# Patient Record
Sex: Female | Born: 1985 | Race: Black or African American | Hispanic: No | Smoking: Current some day smoker
Health system: Southern US, Community
[De-identification: ages and names within clinical notes are randomized; demographics above are authoritative.]

## PROBLEM LIST (undated history)

## (undated) DIAGNOSIS — D582 Other hemoglobinopathies: Secondary | ICD-10-CM

## (undated) DIAGNOSIS — G8929 Other chronic pain: Secondary | ICD-10-CM

## (undated) DIAGNOSIS — N739 Female pelvic inflammatory disease, unspecified: Secondary | ICD-10-CM

## (undated) DIAGNOSIS — I10 Essential (primary) hypertension: Secondary | ICD-10-CM

## (undated) DIAGNOSIS — R109 Unspecified abdominal pain: Secondary | ICD-10-CM

## (undated) DIAGNOSIS — G43709 Chronic migraine without aura, not intractable, without status migrainosus: Secondary | ICD-10-CM

## (undated) DIAGNOSIS — M797 Fibromyalgia: Secondary | ICD-10-CM

## (undated) DIAGNOSIS — IMO0002 Reserved for concepts with insufficient information to code with codable children: Secondary | ICD-10-CM

## (undated) DIAGNOSIS — G894 Chronic pain syndrome: Secondary | ICD-10-CM

## (undated) HISTORY — PX: TUBAL LIGATION: SHX77

## (undated) HISTORY — PX: OTHER SURGICAL HISTORY: SHX169

## (undated) HISTORY — PX: WISDOM TOOTH EXTRACTION: SHX21

---
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
## 2004-10-06 ENCOUNTER — Observation Stay: Payer: Self-pay | Admitting: Obstetrics and Gynecology

## 2004-10-07 ENCOUNTER — Observation Stay: Payer: Self-pay

## 2004-10-09 ENCOUNTER — Ambulatory Visit (HOSPITAL_COMMUNITY): Admission: AD | Admit: 2004-10-09 | Discharge: 2004-10-09 | Payer: Self-pay | Admitting: Obstetrics & Gynecology

## 2004-10-12 ENCOUNTER — Inpatient Hospital Stay: Payer: Self-pay | Admitting: Obstetrics and Gynecology

## 2004-10-22 ENCOUNTER — Observation Stay: Payer: Self-pay | Admitting: Obstetrics and Gynecology

## 2004-11-03 ENCOUNTER — Ambulatory Visit (HOSPITAL_COMMUNITY): Admission: AD | Admit: 2004-11-03 | Discharge: 2004-11-03 | Payer: Self-pay | Admitting: Obstetrics & Gynecology

## 2004-11-04 ENCOUNTER — Observation Stay (HOSPITAL_COMMUNITY): Admission: AD | Admit: 2004-11-04 | Discharge: 2004-11-04 | Payer: Self-pay | Admitting: Obstetrics & Gynecology

## 2004-11-04 ENCOUNTER — Observation Stay: Payer: Self-pay

## 2006-03-28 ENCOUNTER — Emergency Department (HOSPITAL_COMMUNITY): Admission: EM | Admit: 2006-03-28 | Discharge: 2006-03-29 | Payer: Self-pay | Admitting: Emergency Medicine

## 2006-08-19 ENCOUNTER — Emergency Department (HOSPITAL_COMMUNITY): Admission: EM | Admit: 2006-08-19 | Discharge: 2006-08-19 | Payer: Self-pay | Admitting: Emergency Medicine

## 2006-11-10 ENCOUNTER — Emergency Department: Payer: Self-pay | Admitting: Emergency Medicine

## 2006-11-28 ENCOUNTER — Emergency Department (HOSPITAL_COMMUNITY): Admission: EM | Admit: 2006-11-28 | Discharge: 2006-11-28 | Payer: Self-pay | Admitting: Emergency Medicine

## 2006-12-20 ENCOUNTER — Emergency Department: Payer: Self-pay | Admitting: Emergency Medicine

## 2007-01-23 ENCOUNTER — Emergency Department: Payer: Self-pay | Admitting: Emergency Medicine

## 2007-04-07 ENCOUNTER — Emergency Department: Payer: Self-pay | Admitting: Emergency Medicine

## 2007-05-30 ENCOUNTER — Emergency Department: Payer: Self-pay | Admitting: Emergency Medicine

## 2007-06-14 ENCOUNTER — Emergency Department (HOSPITAL_COMMUNITY): Admission: EM | Admit: 2007-06-14 | Discharge: 2007-06-15 | Payer: Self-pay | Admitting: Emergency Medicine

## 2007-07-22 ENCOUNTER — Emergency Department (HOSPITAL_COMMUNITY): Admission: EM | Admit: 2007-07-22 | Discharge: 2007-07-22 | Payer: Self-pay | Admitting: Emergency Medicine

## 2007-08-28 ENCOUNTER — Emergency Department: Payer: Self-pay | Admitting: Emergency Medicine

## 2007-10-24 ENCOUNTER — Emergency Department (HOSPITAL_COMMUNITY): Admission: EM | Admit: 2007-10-24 | Discharge: 2007-10-24 | Payer: Self-pay | Admitting: Emergency Medicine

## 2008-02-25 ENCOUNTER — Emergency Department (HOSPITAL_COMMUNITY): Admission: EM | Admit: 2008-02-25 | Discharge: 2008-02-25 | Payer: Self-pay | Admitting: Emergency Medicine

## 2008-03-10 ENCOUNTER — Emergency Department: Payer: Self-pay | Admitting: Emergency Medicine

## 2008-03-28 ENCOUNTER — Emergency Department (HOSPITAL_COMMUNITY): Admission: EM | Admit: 2008-03-28 | Discharge: 2008-03-28 | Payer: Self-pay | Admitting: Emergency Medicine

## 2008-05-13 ENCOUNTER — Emergency Department (HOSPITAL_COMMUNITY): Admission: EM | Admit: 2008-05-13 | Discharge: 2008-05-13 | Payer: Self-pay | Admitting: Emergency Medicine

## 2008-06-10 ENCOUNTER — Emergency Department: Payer: Self-pay | Admitting: Emergency Medicine

## 2008-06-24 ENCOUNTER — Emergency Department (HOSPITAL_COMMUNITY): Admission: EM | Admit: 2008-06-24 | Discharge: 2008-06-24 | Payer: Self-pay | Admitting: Emergency Medicine

## 2008-06-27 ENCOUNTER — Emergency Department (HOSPITAL_COMMUNITY): Admission: EM | Admit: 2008-06-27 | Discharge: 2008-06-27 | Payer: Self-pay | Admitting: Emergency Medicine

## 2008-07-03 ENCOUNTER — Emergency Department (HOSPITAL_COMMUNITY): Admission: EM | Admit: 2008-07-03 | Discharge: 2008-07-03 | Payer: Self-pay | Admitting: Emergency Medicine

## 2008-07-04 ENCOUNTER — Emergency Department (HOSPITAL_COMMUNITY): Admission: EM | Admit: 2008-07-04 | Discharge: 2008-07-04 | Payer: Self-pay | Admitting: Emergency Medicine

## 2008-08-11 ENCOUNTER — Emergency Department (HOSPITAL_COMMUNITY): Admission: EM | Admit: 2008-08-11 | Discharge: 2008-08-11 | Payer: Self-pay | Admitting: Emergency Medicine

## 2008-08-21 ENCOUNTER — Emergency Department (HOSPITAL_COMMUNITY): Admission: EM | Admit: 2008-08-21 | Discharge: 2008-08-21 | Payer: Self-pay | Admitting: Emergency Medicine

## 2008-09-28 ENCOUNTER — Emergency Department (HOSPITAL_COMMUNITY): Admission: EM | Admit: 2008-09-28 | Discharge: 2008-09-28 | Payer: Self-pay | Admitting: Emergency Medicine

## 2008-10-05 ENCOUNTER — Emergency Department (HOSPITAL_COMMUNITY): Admission: EM | Admit: 2008-10-05 | Discharge: 2008-10-05 | Payer: Self-pay | Admitting: Emergency Medicine

## 2008-10-27 ENCOUNTER — Emergency Department (HOSPITAL_COMMUNITY): Admission: EM | Admit: 2008-10-27 | Discharge: 2008-10-27 | Payer: Self-pay | Admitting: Emergency Medicine

## 2008-11-20 ENCOUNTER — Emergency Department (HOSPITAL_COMMUNITY): Admission: EM | Admit: 2008-11-20 | Discharge: 2008-11-20 | Payer: Self-pay | Admitting: Emergency Medicine

## 2008-12-08 ENCOUNTER — Encounter: Payer: Self-pay | Admitting: Emergency Medicine

## 2008-12-08 ENCOUNTER — Observation Stay (HOSPITAL_COMMUNITY): Admission: AD | Admit: 2008-12-08 | Discharge: 2008-12-09 | Payer: Self-pay | Admitting: Obstetrics and Gynecology

## 2008-12-31 ENCOUNTER — Inpatient Hospital Stay (HOSPITAL_COMMUNITY): Admission: AD | Admit: 2008-12-31 | Discharge: 2008-12-31 | Payer: Self-pay | Admitting: Obstetrics and Gynecology

## 2009-01-23 ENCOUNTER — Inpatient Hospital Stay (HOSPITAL_COMMUNITY): Admission: AD | Admit: 2009-01-23 | Discharge: 2009-01-23 | Payer: Self-pay | Admitting: Obstetrics and Gynecology

## 2009-02-14 ENCOUNTER — Emergency Department (HOSPITAL_COMMUNITY): Admission: EM | Admit: 2009-02-14 | Discharge: 2009-02-14 | Payer: Self-pay | Admitting: Emergency Medicine

## 2009-03-30 ENCOUNTER — Inpatient Hospital Stay (HOSPITAL_COMMUNITY): Admission: AD | Admit: 2009-03-30 | Discharge: 2009-03-30 | Payer: Self-pay | Admitting: Obstetrics and Gynecology

## 2009-04-03 ENCOUNTER — Inpatient Hospital Stay (HOSPITAL_COMMUNITY): Admission: AD | Admit: 2009-04-03 | Discharge: 2009-04-05 | Payer: Self-pay | Admitting: Obstetrics and Gynecology

## 2009-04-06 ENCOUNTER — Inpatient Hospital Stay (HOSPITAL_COMMUNITY): Admission: AD | Admit: 2009-04-06 | Discharge: 2009-04-06 | Payer: Self-pay | Admitting: Obstetrics and Gynecology

## 2009-04-11 ENCOUNTER — Inpatient Hospital Stay (HOSPITAL_COMMUNITY): Admission: AD | Admit: 2009-04-11 | Discharge: 2009-04-11 | Payer: Self-pay | Admitting: Obstetrics and Gynecology

## 2009-04-12 ENCOUNTER — Inpatient Hospital Stay (HOSPITAL_COMMUNITY): Admission: AD | Admit: 2009-04-12 | Discharge: 2009-04-15 | Payer: Self-pay | Admitting: Obstetrics and Gynecology

## 2009-05-05 ENCOUNTER — Inpatient Hospital Stay (HOSPITAL_COMMUNITY): Admission: AD | Admit: 2009-05-05 | Discharge: 2009-05-07 | Payer: Self-pay | Admitting: Obstetrics & Gynecology

## 2009-05-15 ENCOUNTER — Inpatient Hospital Stay (HOSPITAL_COMMUNITY): Admission: AD | Admit: 2009-05-15 | Discharge: 2009-05-18 | Payer: Self-pay | Admitting: Obstetrics and Gynecology

## 2009-05-15 ENCOUNTER — Ambulatory Visit: Payer: Self-pay | Admitting: Obstetrics and Gynecology

## 2009-05-17 ENCOUNTER — Encounter: Payer: Self-pay | Admitting: Obstetrics and Gynecology

## 2009-07-27 ENCOUNTER — Emergency Department (HOSPITAL_COMMUNITY): Admission: EM | Admit: 2009-07-27 | Discharge: 2009-07-27 | Payer: Self-pay | Admitting: Emergency Medicine

## 2009-09-01 ENCOUNTER — Emergency Department (HOSPITAL_COMMUNITY): Admission: EM | Admit: 2009-09-01 | Discharge: 2009-09-01 | Payer: Self-pay | Admitting: Emergency Medicine

## 2009-11-23 ENCOUNTER — Emergency Department (HOSPITAL_COMMUNITY): Admission: EM | Admit: 2009-11-23 | Discharge: 2009-11-23 | Payer: Self-pay | Admitting: Emergency Medicine

## 2010-03-24 ENCOUNTER — Emergency Department (HOSPITAL_COMMUNITY): Payer: Medicaid Other

## 2010-03-24 ENCOUNTER — Inpatient Hospital Stay (HOSPITAL_COMMUNITY)
Admission: EM | Admit: 2010-03-24 | Discharge: 2010-03-28 | DRG: 395 | Disposition: A | Discharge status: Home / Self Care | Payer: Medicaid Other | Attending: Internal Medicine | Admitting: Internal Medicine

## 2010-03-24 DIAGNOSIS — G4700 Insomnia, unspecified: Secondary | ICD-10-CM | POA: Diagnosis present

## 2010-03-24 DIAGNOSIS — I880 Nonspecific mesenteric lymphadenitis: Principal | ICD-10-CM | POA: Diagnosis present

## 2010-03-24 DIAGNOSIS — G43909 Migraine, unspecified, not intractable, without status migrainosus: Secondary | ICD-10-CM | POA: Diagnosis present

## 2010-03-24 DIAGNOSIS — I10 Essential (primary) hypertension: Secondary | ICD-10-CM | POA: Diagnosis present

## 2010-03-24 LAB — DIFFERENTIAL
Eosinophils Absolute: 0.2 10*3/uL (ref 0.0–0.7)
Eosinophils Relative: 3 % (ref 0–5)
Monocytes Absolute: 0.6 10*3/uL (ref 0.1–1.0)
Neutro Abs: 2.5 10*3/uL (ref 1.7–7.7)
Neutrophils Relative %: 36 % — ABNORMAL LOW (ref 43–77)

## 2010-03-24 LAB — PREGNANCY, URINE: Preg Test, Ur: NEGATIVE

## 2010-03-24 LAB — COMPREHENSIVE METABOLIC PANEL
ALT: 9 U/L (ref 0–35)
Alkaline Phosphatase: 38 U/L — ABNORMAL LOW (ref 39–117)
BUN: 6 mg/dL (ref 6–23)
Creatinine, Ser: 0.59 mg/dL (ref 0.4–1.2)
GFR calc non Af Amer: >60 mL/min (ref >60)
Potassium: 3.6 mEq/L (ref 3.5–5.1)

## 2010-03-24 LAB — CBC
HCT: 35.8 % — ABNORMAL LOW (ref 36.0–46.0)
MCH: 28.3 pg (ref 26.0–34.0)
MCHC: 35.8 g/dL (ref 30.0–36.0)
Platelets: 279 10*3/uL (ref 150–400)
WBC: 6.8 10*3/uL (ref 4.0–10.5)

## 2010-03-24 LAB — URINALYSIS, ROUTINE W REFLEX MICROSCOPIC
Bilirubin Urine: NEGATIVE
Hgb urine dipstick: NEGATIVE
Ketones, ur: NEGATIVE mg/dL
Protein, ur: NEGATIVE mg/dL
Specific Gravity, Urine: 1.025 (ref 1.005–1.030)
pH: 5.5 (ref 5.0–8.0)

## 2010-03-24 MED ORDER — IOHEXOL 300 MG/ML  SOLN
100.0000 mL | Freq: Once | INTRAMUSCULAR | Status: AC | PRN
  Administered 2010-03-24: 100 mL via INTRAVENOUS

## 2010-03-25 LAB — DIFFERENTIAL
Basophils Absolute: 0 10*3/uL (ref 0.0–0.1)
Eosinophils Absolute: 0.2 10*3/uL (ref 0.0–0.7)
Eosinophils Relative: 3 % (ref 0–5)
Lymphocytes Relative: 43 % (ref 12–46)
Monocytes Absolute: 0.6 10*3/uL (ref 0.1–1.0)
Monocytes Relative: 9 % (ref 3–12)
Neutro Abs: 2.8 10*3/uL (ref 1.7–7.7)
Neutrophils Relative %: 45 % (ref 43–77)

## 2010-03-25 LAB — CBC
MCH: 28.1 pg (ref 26.0–34.0)
MCV: 79.6 fL (ref 78.0–100.0)

## 2010-03-26 DIAGNOSIS — R109 Unspecified abdominal pain: Secondary | ICD-10-CM

## 2010-03-26 DIAGNOSIS — R112 Nausea with vomiting, unspecified: Secondary | ICD-10-CM

## 2010-03-26 DIAGNOSIS — R933 Abnormal findings on diagnostic imaging of other parts of digestive tract: Secondary | ICD-10-CM

## 2010-03-27 LAB — DIFFERENTIAL
Basophils Absolute: 0 10*3/uL (ref 0.0–0.1)
Basophils Relative: 0 % (ref 0–1)
Eosinophils Absolute: 0.3 10*3/uL (ref 0.0–0.7)
Eosinophils Relative: 5 % (ref 0–5)
Monocytes Absolute: 0.7 10*3/uL (ref 0.1–1.0)

## 2010-03-27 LAB — COMPREHENSIVE METABOLIC PANEL
AST: 15 U/L (ref 0–37)
Albumin: 3.2 g/dL — ABNORMAL LOW (ref 3.5–5.2)
CO2: 30 mEq/L (ref 19–32)
Calcium: 8.7 mg/dL (ref 8.4–10.5)
Creatinine, Ser: 0.81 mg/dL (ref 0.4–1.2)
GFR calc Af Amer: >60 mL/min (ref >60)
GFR calc non Af Amer: >60 mL/min (ref >60)

## 2010-03-27 LAB — CBC
MCHC: 35 g/dL (ref 30.0–36.0)
RDW: 13.4 % (ref 11.5–15.5)

## 2010-03-27 LAB — AMYLASE: Amylase: 67 U/L (ref 0–105)

## 2010-03-29 NOTE — Op Note (Signed)
  NAMEGINAMARIE, Tanya Krause               ACCOUNT NO.:  [id number]  MEDICAL RECORD NO.:  [id number]           PATIENT TYPE:  I  LOCATION:  A304                          FACILITY:  [facility]  PHYSICIAN:  R. Roetta Sessions, M.D. DATE OF BIRTH:  16-Jun-1985  DATE OF PROCEDURE:  03/27/2010 DATE OF DISCHARGE:                              OPERATIVE REPORT   INDICATIONS FOR PROCEDURE:  A 25 year old lady with nausea, vomiting, right-sided abdominal pain, retrosternal epigastric burning.  CT demonstrated multiple non-enlarged lymph nodes in the pelvis and question thickening of the antrum of the stomach.  EGD is now being done to further evaluate her symptoms.  It is notable overnight the patient states her abdominal discomfort, nausea have improved significantly and she wants to eat.  EGD is now being done.  Risks, benefits, limitations, alternatives, imponderables have been discussed, questions answered. Please see documentation medical record.  PROCEDURE NOTE:  O2 saturation, blood pressure, pulse respirations monitored throughout the entire procedure.  CONSCIOUS SEDATION:  Versed 3 mg IV, Demerol 50 mg IV in divided doses.  INSTRUMENT:  Pentax video chip system.  ANESTHESIA:  Cetacaine spray for topical pharyngeal anesthesia.  FINDINGS:  Examination of the tubular esophagus revealed no mucosal abnormalities.  EG junction easily traversed.  STOMACH:  Gastric cavity was emptied and insufflated well with air. Thorough examination of the gastric mucosa including retroflexed view of proximal stomach esophagogastric junction demonstrated entirely normal- appearing gastric mucosa.  Pylorus patent, easily traversed. Examination of the bulb and second portion revealed no abnormalities.  THERAPEUTIC/DIAGNOSTIC MANEUVERS PERFORMED:  None.  The patient tolerated the procedure well, was reactive.  ENDOSCOPY IMPRESSION:  Normal esophagus, stomach, D1 and D2.  Overall [name]'s symptoms have  improved significantly.  She has multiple pelvic abdominal nodes (none are pathologically enlarged). This may represent mesenteric adenitis.  Treatment will be largely symptomatic at this time.  Abnormal stomach on CT more likely artifact in origin.  RECOMMENDATIONS: 1. Once daily PPI for now should suffice empirically. 2. Advanced to low-residue diet. 3. We will anticipate discharge home within the next 24 hours per     Hospitalist Service.     Jonathon Bellows, M.D.     RMR/MEDQ  D:  03/27/2010  T:  03/27/2010  Job:  [id number]  cc:   Hospitalist Service  Electronically Signed by Lorrin Goodell M.D. on 03/29/2010 10:30:35 AM

## 2010-03-30 LAB — CBC
Hemoglobin: 12.2 g/dL (ref 12.0–15.0)
MCH: 28.1 pg (ref 26.0–34.0)
RBC: 4.33 MIL/uL (ref 3.87–5.11)

## 2010-03-30 LAB — COMPREHENSIVE METABOLIC PANEL
ALT: 10 U/L (ref 0–35)
AST: 15 U/L (ref 0–37)
Alkaline Phosphatase: 42 U/L (ref 39–117)
CO2: 27 mEq/L (ref 19–32)
Chloride: 106 mEq/L (ref 96–112)
Creatinine, Ser: 0.66 mg/dL (ref 0.4–1.2)
GFR calc Af Amer: >60 mL/min (ref >60)
GFR calc non Af Amer: >60 mL/min (ref >60)
Potassium: 3.8 mEq/L (ref 3.5–5.1)
Total Bilirubin: 0.7 mg/dL (ref 0.3–1.2)

## 2010-03-30 LAB — DIFFERENTIAL
Basophils Absolute: 0 10*3/uL (ref 0.0–0.1)
Basophils Relative: 0 % (ref 0–1)
Eosinophils Absolute: 0.3 10*3/uL (ref 0.0–0.7)
Eosinophils Relative: 4 % (ref 0–5)

## 2010-03-30 LAB — CULTURE, BLOOD (ROUTINE X 2): Culture: NO GROWTH

## 2010-04-01 LAB — URINALYSIS, ROUTINE W REFLEX MICROSCOPIC
Bilirubin Urine: NEGATIVE
Nitrite: NEGATIVE
Protein, ur: NEGATIVE mg/dL
Specific Gravity, Urine: 1.01 (ref 1.005–1.030)
Urobilinogen, UA: 0.2 mg/dL (ref 0.0–1.0)

## 2010-04-01 LAB — CBC
Platelets: 239 10*3/uL (ref 150–400)
RDW: 14.9 % (ref 11.5–15.5)
WBC: 6.4 10*3/uL (ref 4.0–10.5)

## 2010-04-01 LAB — DIFFERENTIAL
Basophils Absolute: 0.1 10*3/uL (ref 0.0–0.1)
Basophils Relative: 1 % (ref 0–1)
Lymphocytes Relative: 40 % (ref 12–46)
Neutro Abs: 2.7 10*3/uL (ref 1.7–7.7)

## 2010-04-01 LAB — POCT PREGNANCY, URINE: Preg Test, Ur: NEGATIVE

## 2010-04-01 LAB — URINE MICROSCOPIC-ADD ON: RBC / HPF: NONE SEEN RBC/hpf (ref <3)

## 2010-04-02 DIAGNOSIS — R109 Unspecified abdominal pain: Secondary | ICD-10-CM

## 2010-04-04 LAB — URINE MICROSCOPIC-ADD ON

## 2010-04-04 LAB — URINALYSIS, ROUTINE W REFLEX MICROSCOPIC
Bilirubin Urine: NEGATIVE
Glucose, UA: NEGATIVE mg/dL
Hgb urine dipstick: NEGATIVE
Ketones, ur: NEGATIVE mg/dL
Ketones, ur: NEGATIVE mg/dL
Nitrite: NEGATIVE
Nitrite: NEGATIVE
pH: 6 (ref 5.0–8.0)
pH: 7 (ref 5.0–8.0)

## 2010-04-04 LAB — DIFFERENTIAL
Basophils Absolute: 0 10*3/uL (ref 0.0–0.1)
Basophils Absolute: 0 10*3/uL (ref 0.0–0.1)
Eosinophils Absolute: 0.2 10*3/uL (ref 0.0–0.7)
Eosinophils Absolute: 0.5 10*3/uL (ref 0.0–0.7)
Eosinophils Relative: 4 % (ref 0–5)
Eosinophils Relative: 3 % (ref 0–5)
Lymphocytes Relative: 44 % (ref 12–46)
Lymphs Abs: 1.4 10*3/uL (ref 0.7–4.0)
Monocytes Absolute: 0.4 10*3/uL (ref 0.1–1.0)
Neutrophils Relative %: 83 % — ABNORMAL HIGH (ref 43–77)

## 2010-04-04 LAB — CBC
Hemoglobin: 12.4 g/dL (ref 12.0–15.0)
MCH: 27.6 pg (ref 26.0–34.0)
Platelets: 240 10*3/uL (ref 150–400)
Platelets: 204 10*3/uL (ref 150–400)
RBC: 4.5 MIL/uL (ref 3.87–5.11)
RDW: 13.5 % (ref 11.5–15.5)
WBC: 5.8 10*3/uL (ref 4.0–10.5)
WBC: 15.2 10*3/uL — ABNORMAL HIGH (ref 4.0–10.5)

## 2010-04-04 LAB — COMPREHENSIVE METABOLIC PANEL
ALT: 13 U/L (ref 0–35)
ALT: 45 U/L — ABNORMAL HIGH (ref 0–35)
AST: 15 U/L (ref 0–37)
AST: 29 U/L (ref 0–37)
Albumin: 3.8 g/dL (ref 3.5–5.2)
Alkaline Phosphatase: 48 U/L (ref 39–117)
CO2: 27 mEq/L (ref 19–32)
CO2: 24 mEq/L (ref 19–32)
Calcium: 8.8 mg/dL (ref 8.4–10.5)
Chloride: 105 mEq/L (ref 96–112)
Chloride: 103 mEq/L (ref 96–112)
Creatinine, Ser: 0.71 mg/dL (ref 0.4–1.2)
Creatinine, Ser: 0.47 mg/dL (ref 0.4–1.2)
GFR calc Af Amer: >60 mL/min (ref >60)
GFR calc non Af Amer: >60 mL/min (ref >60)
GFR calc non Af Amer: >60 mL/min (ref >60)
Glucose, Bld: 85 mg/dL (ref 70–99)
Potassium: 3.3 mEq/L — ABNORMAL LOW (ref 3.5–5.1)
Sodium: 137 mEq/L (ref 135–145)
Sodium: 134 mEq/L — ABNORMAL LOW (ref 135–145)
Total Bilirubin: 0.7 mg/dL (ref 0.3–1.2)
Total Bilirubin: 0.4 mg/dL (ref 0.3–1.2)

## 2010-04-04 LAB — LIPASE, BLOOD: Lipase: 35 U/L (ref 11–59)

## 2010-04-04 LAB — URINE CULTURE: Culture: NO GROWTH

## 2010-04-04 LAB — WET PREP, GENITAL: Yeast Wet Prep HPF POC: NONE SEEN

## 2010-04-04 LAB — PROTIME-INR
INR: 0.98 (ref 0.00–1.49)
Prothrombin Time: 12.9 seconds (ref 11.6–15.2)

## 2010-04-06 LAB — COMPREHENSIVE METABOLIC PANEL
ALT: 11 U/L (ref 0–35)
ALT: 15 U/L (ref 0–35)
AST: 16 U/L (ref 0–37)
AST: 17 U/L (ref 0–37)
Albumin: 2.4 g/dL — ABNORMAL LOW (ref 3.5–5.2)
Albumin: 2.4 g/dL — ABNORMAL LOW (ref 3.5–5.2)
Alkaline Phosphatase: 113 U/L (ref 39–117)
BUN: <1 mg/dL — ABNORMAL LOW (ref 6–23)
CO2: 21 mEq/L (ref 19–32)
Calcium: 8.8 mg/dL (ref 8.4–10.5)
Calcium: 8.9 mg/dL (ref 8.4–10.5)
Calcium: 9.1 mg/dL (ref 8.4–10.5)
Chloride: 105 mEq/L (ref 96–112)
Creatinine, Ser: 0.47 mg/dL (ref 0.4–1.2)
GFR calc Af Amer: >60 mL/min (ref >60)
GFR calc Af Amer: >60 mL/min (ref >60)
GFR calc non Af Amer: >60 mL/min (ref >60)
Glucose, Bld: 76 mg/dL (ref 70–99)
Potassium: 3.4 mEq/L — ABNORMAL LOW (ref 3.5–5.1)
Sodium: 135 mEq/L (ref 135–145)
Sodium: 137 mEq/L (ref 135–145)
Total Bilirubin: 0.7 mg/dL (ref 0.3–1.2)
Total Protein: 6.3 g/dL (ref 6.0–8.3)

## 2010-04-06 LAB — CREATININE, URINE, 24 HOUR
Collection Interval-UCRE24: 24 hours
Creatinine, 24H Ur: 1220 mg/d (ref 700–1800)
Creatinine, Urine: 93.9 mg/dL
Urine Total Volume-UCRE24: 1300 mL

## 2010-04-06 LAB — DIFFERENTIAL
Basophils Absolute: 0 10*3/uL (ref 0.0–0.1)
Lymphocytes Relative: 23 % (ref 12–46)
Lymphs Abs: 2.4 10*3/uL (ref 0.7–4.0)
Monocytes Absolute: 0.9 10*3/uL (ref 0.1–1.0)
Neutro Abs: 6.4 10*3/uL (ref 1.7–7.7)

## 2010-04-06 LAB — CBC
HCT: 36.4 % (ref 36.0–46.0)
Hemoglobin: 10.2 g/dL — ABNORMAL LOW (ref 12.0–15.0)
Hemoglobin: 12.1 g/dL (ref 12.0–15.0)
MCHC: 34 g/dL (ref 30.0–36.0)
MCHC: 33.2 g/dL (ref 30.0–36.0)
MCHC: 34.1 g/dL (ref 30.0–36.0)
MCHC: 34.6 g/dL (ref 30.0–36.0)
MCV: 85.2 fL (ref 78.0–100.0)
Platelets: 216 10*3/uL (ref 150–400)
Platelets: 197 10*3/uL (ref 150–400)
RBC: 4.09 MIL/uL (ref 3.87–5.11)
RDW: 14.8 % (ref 11.5–15.5)
RDW: 14.2 % (ref 11.5–15.5)
RDW: 14.4 % (ref 11.5–15.5)
WBC: 10.1 10*3/uL (ref 4.0–10.5)

## 2010-04-06 LAB — URINALYSIS, ROUTINE W REFLEX MICROSCOPIC
Bilirubin Urine: NEGATIVE
Hgb urine dipstick: NEGATIVE
Ketones, ur: >80 mg/dL — AB
Nitrite: NEGATIVE
Nitrite: NEGATIVE
Protein, ur: NEGATIVE mg/dL
Protein, ur: NEGATIVE mg/dL
Specific Gravity, Urine: 1.02 (ref 1.005–1.030)
Urobilinogen, UA: 0.2 mg/dL (ref 0.0–1.0)
Urobilinogen, UA: 2 mg/dL — ABNORMAL HIGH (ref 0.0–1.0)
pH: 6.5 (ref 5.0–8.0)

## 2010-04-06 LAB — RPR: RPR Ser Ql: NONREACTIVE

## 2010-04-06 LAB — ABO/RH: ABO/RH(D): B POS

## 2010-04-06 LAB — PROTEIN, URINE, 24 HOUR: Protein, 24H Urine: 65 mg/d (ref 50–100)

## 2010-04-06 LAB — STREP B DNA PROBE

## 2010-04-06 LAB — URINE MICROSCOPIC-ADD ON

## 2010-04-10 NOTE — H&P (Signed)
Tanya Krause, Tanya Krause               ACCOUNT NO.:  [id number]  MEDICAL RECORD NO.:  [id number]           PATIENT TYPE:  E  LOCATION:  APED                          FACILITY:  [facility]  PHYSICIAN:  Houston Siren, MD           DATE OF BIRTH:  06/09/1985  DATE OF ADMISSION:  03/24/2010 DATE OF DISCHARGE:  LH                             HISTORY & PHYSICAL   PRIMARY CARE PHYSICIAN:  Unassigned.  REASON FOR ADMISSION:  Abdominal pain.  ADVANCE DIRECTIVE:  Full code.  HISTORY OF PRESENT ILLNESS:  This is a 25 year old female, status post bilateral tubal ligations, history of ovarian cyst, history of hypertension, migraine, prior pyelonephritis, sickle cell trait, presents to the emergency room with 2-day history of right lower quadrant pain radiating to her right groin.  She denies any vaginal discharge or any dysuria.  She has no fever or chills.  She actually had a bowel movement this morning which reportedly was normal.  There has been no diarrhea, black stool, or bloody stool.  Pain is described as severe and she was not able to ambulate on it.  Yesterday, when she ate she had some nausea and vomiting of bilious material.  However, she denies any loss of appetite.  Workup in emergency room included a negative urinalysis, a normal white count, and she is afebrile.  She has a normal creatinine of 0.6, and her urine pregnancy test is negative.  An abdominal pelvic CT shows some mesenteric adenitis, but no definite appendicitis and normal adnexal structures.  I did a bimanual exam as well.  There is no cervical motion tenderness or any adnexal tenderness. Hospitalist was asked to admit the patient for further evaluation of her abdominal pain.  PAST MEDICAL HISTORY:  As above.  MEDICATIONS:  Norvasc 5 mg per day and sumatriptan as needed for migraine.  ALLERGIES:  IBUPROFEN, ASPIRIN, and SEAFOOD.  REVIEW OF SYSTEMS:  Otherwise unremarkable.  FAMILY HISTORY:  Noncontributory.  SOCIAL  HISTORY:  She is separated.  She has four children.  Prior she worked in a [employer].  Her last menstrual period was 2 weeks ago and was normal.  She denies any drug use alcohol or tobacco use.  PHYSICAL EXAMINATION:  VITAL SIGNS:  Afebrile, blood pressure 130/90, pulse is 70, and respiratory rate 20. GENERAL:  She is in no apparent distress.  Alert and oriented and conversing. HEENT:  She has facial symmetry.  Her speech is fluent.  Sclerae are nonicteric.  Throat is clear. NECK:  Supple.  No cervical adenopathy. CARDIAC:  S1 and S2 regular.  I did not hear murmur, rub, or gallop. LUNGS:  Clear. ABDOMEN:  Soft, nondistended, tender over right lower quadrant.  Psoas sign is positive.  Bimanual exam was unremarkable.  Bowel sounds present.  No palpable mass and no rebound.  No ascites. EXTREMITIES:  With no edema.  She has good distal pulses bilaterally. SKIN:  Warm and dry. NEUROLOGICAL:  Nonfocal. PSYCHIATRIC:  Normal as well.  OBJECTIVE FINDINGS:  Serum sodium 137, potassium 3.6, glucose of 78, and creatinine 0.6.  Liver function tests  are all normal.  CT of the abdomen and pelvis show numerous normal-sized lymph nodes in the right abdomen, nonspecific but consistent with mesenteric adenitis.  Urinalysis is negative.  White count of 6.8, hemoglobin of 12.8, and platelet count 279,000.  IMPRESSION:  This is a 25 year old female who presents with abdominal pain.  Clinically, she presents like early appendicitis but she has no fever and no white count and did not lose her appetite.  She does have a CT scan suggestive of mesenteric adenitis.  We will admit her, we will treat with pain medications, we can see if she can tolerate clear liquids.  We will give her IV fluids and follow her clinically.  We will get serial CBCs.  I will hold her Norvasc.  Her blood pressures is in a nice range at this point.  I will go ahead and start her on Unasyn.  She is a full code and will be  admitted to [facility] at [facility].     Houston Siren, MD     PL/MEDQ  D:  03/24/2010  T:  03/24/2010  Job:  [id number]  Electronically Signed by Houston Siren  on 04/10/2010 04:32:37 AM

## 2010-04-12 LAB — URINALYSIS, ROUTINE W REFLEX MICROSCOPIC
Bilirubin Urine: NEGATIVE
Bilirubin Urine: NEGATIVE
Glucose, UA: NEGATIVE mg/dL
Glucose, UA: NEGATIVE mg/dL
Glucose, UA: NEGATIVE mg/dL
Hgb urine dipstick: NEGATIVE
Hgb urine dipstick: NEGATIVE
Ketones, ur: 15 mg/dL — AB
Ketones, ur: NEGATIVE mg/dL
Nitrite: NEGATIVE
Protein, ur: NEGATIVE mg/dL
Protein, ur: NEGATIVE mg/dL
Specific Gravity, Urine: 1.015 (ref 1.005–1.030)
Urobilinogen, UA: 1 mg/dL (ref 0.0–1.0)
pH: 7.5 (ref 5.0–8.0)
pH: 8 (ref 5.0–8.0)

## 2010-04-12 LAB — CBC
HCT: 35.1 % — ABNORMAL LOW (ref 36.0–46.0)
HCT: 37.7 % (ref 36.0–46.0)
Hemoglobin: 11.7 g/dL — ABNORMAL LOW (ref 12.0–15.0)
Hemoglobin: 12.6 g/dL (ref 12.0–15.0)
MCHC: 33.4 g/dL (ref 30.0–36.0)
MCHC: 33.5 g/dL (ref 30.0–36.0)
MCV: 86.1 fL (ref 78.0–100.0)
MCV: 87.3 fL (ref 78.0–100.0)
Platelets: 189 10*3/uL (ref 150–400)
Platelets: 212 K/uL (ref 150–400)
RBC: 4.38 MIL/uL (ref 3.87–5.11)
RDW: 14.2 % (ref 11.5–15.5)
RDW: 14.3 % (ref 11.5–15.5)
WBC: 10.9 K/uL — ABNORMAL HIGH (ref 4.0–10.5)
WBC: 18.6 10*3/uL — ABNORMAL HIGH (ref 4.0–10.5)

## 2010-04-12 LAB — PROTEIN, URINE, 24 HOUR
Collection Interval-UPROT: 24 hours
Protein, 24H Urine: UNDETERMINED mg/d (ref 50–100)
Protein, Urine: <3 mg/dL
Urine Total Volume-UPROT: 1400 mL

## 2010-04-12 LAB — COMPREHENSIVE METABOLIC PANEL WITH GFR
ALT: 16 U/L (ref 0–35)
AST: 16 U/L (ref 0–37)
Albumin: 2.5 g/dL — ABNORMAL LOW (ref 3.5–5.2)
Alkaline Phosphatase: 78 U/L (ref 39–117)
BUN: 2 mg/dL — ABNORMAL LOW (ref 6–23)
CO2: 23 meq/L (ref 19–32)
Calcium: 8.9 mg/dL (ref 8.4–10.5)
Chloride: 106 meq/L (ref 96–112)
Creatinine, Ser: 0.37 mg/dL — ABNORMAL LOW (ref 0.4–1.2)
GFR calc non Af Amer: >60 mL/min
Glucose, Bld: 83 mg/dL (ref 70–99)
Potassium: 3.7 meq/L (ref 3.5–5.1)
Sodium: 135 meq/L (ref 135–145)
Total Bilirubin: 0.5 mg/dL (ref 0.3–1.2)
Total Protein: 5.5 g/dL — ABNORMAL LOW (ref 6.0–8.3)

## 2010-04-12 LAB — COMPREHENSIVE METABOLIC PANEL
ALT: 22 U/L (ref 0–35)
AST: 27 U/L (ref 0–37)
Albumin: 2.7 g/dL — ABNORMAL LOW (ref 3.5–5.2)
Alkaline Phosphatase: 63 U/L (ref 39–117)
BUN: 3 mg/dL — ABNORMAL LOW (ref 6–23)
CO2: 23 mEq/L (ref 19–32)
Calcium: 8.4 mg/dL (ref 8.4–10.5)
Chloride: 106 mEq/L (ref 96–112)
Creatinine, Ser: 0.35 mg/dL — ABNORMAL LOW (ref 0.4–1.2)
GFR calc Af Amer: >60 mL/min (ref >60)
GFR calc non Af Amer: >60 mL/min (ref >60)
Glucose, Bld: 90 mg/dL (ref 70–99)
Potassium: 3.3 mEq/L — ABNORMAL LOW (ref 3.5–5.1)
Sodium: 136 mEq/L (ref 135–145)
Total Bilirubin: 0.1 mg/dL — ABNORMAL LOW (ref 0.3–1.2)
Total Protein: 5.9 g/dL — ABNORMAL LOW (ref 6.0–8.3)

## 2010-04-12 LAB — CREATININE, URINE, 24 HOUR
Collection Interval-UCRE24: 24 h
Creatinine, 24H Ur: 865 mg/d (ref 700–1800)
Creatinine, Urine: 61.8 mg/dL
Urine Total Volume-UCRE24: 1400 mL

## 2010-04-12 LAB — URINALYSIS, DIPSTICK ONLY
Bilirubin Urine: NEGATIVE
Hgb urine dipstick: NEGATIVE
Protein, ur: NEGATIVE mg/dL
Urobilinogen, UA: 0.2 mg/dL (ref 0.0–1.0)

## 2010-04-12 LAB — GC/CHLAMYDIA PROBE AMP, GENITAL
Chlamydia, DNA Probe: NEGATIVE
GC Probe Amp, Genital: NEGATIVE

## 2010-04-12 LAB — RPR: RPR Ser Ql: NONREACTIVE

## 2010-04-12 LAB — RAPID STREP SCREEN (MED CTR MEBANE ONLY)
Streptococcus, Group A Screen (Direct): NEGATIVE
Streptococcus, Group A Screen (Direct): NEGATIVE

## 2010-04-12 LAB — URIC ACID
Uric Acid, Serum: 4 mg/dL (ref 2.4–7.0)
Uric Acid, Serum: 4.4 mg/dL (ref 2.4–7.0)

## 2010-04-12 LAB — STREP B DNA PROBE: Strep Group B Ag: NEGATIVE

## 2010-04-12 LAB — MAGNESIUM: Magnesium: 4.8 mg/dL — ABNORMAL HIGH (ref 1.5–2.5)

## 2010-04-12 LAB — LACTATE DEHYDROGENASE
LDH: 138 U/L (ref 94–250)
LDH: 160 U/L (ref 94–250)

## 2010-04-17 NOTE — Consult Note (Signed)
Tanya Krause, Tanya Krause               ACCOUNT NO.:  [id number]  MEDICAL RECORD NO.:  [id number]           PATIENT TYPE:  I  LOCATION:  A304                          FACILITY:  [facility]  PHYSICIAN:  Roetta Sessions, MD,    DATE OF BIRTH:  Jun 05, 1985  DATE OF CONSULTATION:  03/26/2010 DATE OF DISCHARGE:                                CONSULTATION   REASON FOR CONSULTATION:  Right lower quadrant abdominal pain, nausea and vomiting.  REQUESTING PHYSICIAN:  Corinna L. Lendell Caprice, MD with [facility] AP[facility] team.  PHYSICIAN COSIGNER:  Charlaine Dalton. Sherene Sires, MD, FCCP  HISTORY OF PRESENT ILLNESS:  The patient is a very pleasant 25 year old African American female who presents with a 2-day history of right lower quadrant abdominal pain associated with nausea and vomiting.  She states the pain began in the right lower quadrant area.  Couple hours later, she started having nausea and vomiting.  It became excruciating, so she decided to come to the emergency department.  When she presented, apparently was radiating down to her right groin.  Now, she states it goes up in the right upper quadrant.  She has not vomited today, but she has not been able to eat very much.  She still feels very nauseated. The pain is constant.  She has had regular bowel movements.  Denies blood in the stool or melena.  Denies hematemesis or heartburn, dysphagia, or odynophagia.  Workup thus far includes CT of the abdomen and pelvis with contrast. She had a questionable tiny umbilical hernia containing fat.  Prominent pylorus, increased in conspicuity/thickness since previous exam in 2009. No periduodenal or perigastric inflammatory changes seen.  She had scattered normal size mesenteric lymph nodes with numerous nodes noted in the right lower quadrant.  The appendix was normal.  Minimal nonspecific free pelvic fluid.  Large and small bowel both normal. Uterus and adnexa unremarkable.  Differential includes possibility  of mesenteric adenitis.  Gastric findings possibly due to gastric outlet obstruction or ulcer disease, although it could be secondary to contraction as well.  She has had 2 blood cultures, which were remained negative at 24 hours.  On admission, her white count was normal at 8000, hemoglobin 12.2, platelets 222,000.  Her BUN and creatinine normal.  Her LFTs normal.  Urinalysis was negative.  Urine pregnancy test negative. Yesterday, her white count remained normal at 6300.  GC and chlamydia probe are pending.  MEDICATIONS AT HOME:  Tylenol p.r.n., Flexeril p.r.n., Norvasc 5 mg daily, sumatriptan 50 mg every 2 hours as needed, albuterol inhaler p.r.n.  ALLERGIES:  ASPIRIN causes itching, IBUPROFEN causes itching, SHELLFISH causes swelling, although she did tolerate IV contrast yesterday.  PAST MEDICAL HISTORY:  Asthma, hypertension, migraine headaches, history of pyelonephritis, sickle cell trait, history of ovarian cyst.  PAST SURGICAL HISTORY:  Bilateral tubal ligation in May 2011.  FAMILY HISTORY:  Negative for chronic GI illnesses, colorectal cancer, liver disease.  SOCIAL HISTORY:  She is single.  She has 4 children ranging from age 41 to 10 months.  She states her mother helps her with her children.  She is unemployed.  Her last menstrual  cycle was 2 weeks ago and was normal. She states she is separated.  She denies alcohol, drug or tobacco use.  REVIEW OF SYSTEMS:  See HPI for GI.  CONSTITUTIONAL:  Denies any unintentional weight loss.  CARDIOPULMONARY:  Denies chest pain, shortness of breath, palpitations or cough.  GENITOURINARY:  Denies dysuria, hematuria.  PHYSICAL EXAMINATION:  VITAL SIGNS:  Weight 65.3 kg, temperature 97.9, pulse 78, respirations 16, blood pressure 105/76, O2 sat is 96% on room air. GENERAL:  Well-nourished, well-developed black female.  She appears uncomfortable, but in no acute distress. SKIN:  Warm and dry.  No jaundice. HEENT:  Sclerae  nonicteric.  Oropharyngeal mucosa moist and pink. CHEST:  Lungs are clear to auscultation. CARDIAC:  Regular rate and rhythm.  No murmurs, rubs, gallops. ABDOMEN:  Positive bowel sounds.  Soft.  She has tenderness throughout the entire right abdomen to deep palpation.  No rebound or guarding.  No abdominal bruits or hernias.  No hepatosplenomegaly or masses. LOWER EXTREMITIES:  No edema.  LABORATORY DATA:  Labs as outlined above.  CT as outlined above.  IMPRESSION:  The patient is a 25 year old lady with 3- to 4-day history of right lower quadrant abdominal pain associated with nausea and vomiting.  Her pain is now actually extended into the right mid right upper abdomen.  CT showed some nonspecific normal-sized increase in number, but normal sized lymph nodes in the right lower quadrant without any evidence of appendicitis or any inflammatory bowel issues, etc.  She has a prominent pylorus without surrounding inflammation, which might be due to contraction, but radiologist felt that we need to consider gastric outlet obstruction or peptic ulcer disease.  We would recommend upper endoscopy for further evaluation.  If EGD is unremarkable, symptoms are likely due to mesenteric adenitis and no further management other than supportive management will be necessary.  PLAN:  EGD in the morning.  We will hold her heparin after midnight.  We will add PPI.  I would like to thank Crista Curb for allowing [place] to take part in the care of this patient.     Tana Coast, P.A.   ______________________________ Roetta Sessions, MD    LL/MEDQ  D:  03/26/2010  T:  03/26/2010  Job:  [id number]  cc:   Corinna L. Lendell Caprice, MD  Electronically Signed by Tana Coast P.A. on 04/12/2010 10:15:29 AM Electronically Signed by Sandrea Hughs MD FCCP on 04/17/2010 09:51:05 AM

## 2010-04-20 LAB — WET PREP, GENITAL
Clue Cells Wet Prep HPF POC: NONE SEEN
Yeast Wet Prep HPF POC: NONE SEEN

## 2010-04-20 LAB — CBC
Hemoglobin: 10.9 g/dL — ABNORMAL LOW (ref 12.0–15.0)
Platelets: 214 10*3/uL (ref 150–400)
RDW: 13.3 % (ref 11.5–15.5)

## 2010-04-21 LAB — CBC
HCT: 31.8 % — ABNORMAL LOW (ref 36.0–46.0)
Hemoglobin: 11 g/dL — ABNORMAL LOW (ref 12.0–15.0)
Platelets: 196 10*3/uL (ref 150–400)
Platelets: 224 10*3/uL (ref 150–400)
RDW: 13.9 % (ref 11.5–15.5)
RDW: 13.1 % (ref 11.5–15.5)
WBC: 6.4 10*3/uL (ref 4.0–10.5)
WBC: 7 10*3/uL (ref 4.0–10.5)

## 2010-04-21 LAB — URINE CULTURE
Colony Count: NO GROWTH
Culture: NO GROWTH
Special Requests: POSITIVE

## 2010-04-21 LAB — URINALYSIS, ROUTINE W REFLEX MICROSCOPIC
Bilirubin Urine: NEGATIVE
Bilirubin Urine: NEGATIVE
Glucose, UA: NEGATIVE mg/dL
Glucose, UA: NEGATIVE mg/dL
Ketones, ur: NEGATIVE mg/dL
Ketones, ur: NEGATIVE mg/dL
Nitrite: NEGATIVE
Protein, ur: NEGATIVE mg/dL
Specific Gravity, Urine: 1.01 (ref 1.005–1.030)
Urobilinogen, UA: 0.2 mg/dL (ref 0.0–1.0)
pH: 8 (ref 5.0–8.0)

## 2010-04-21 LAB — BASIC METABOLIC PANEL
BUN: 4 mg/dL — ABNORMAL LOW (ref 6–23)
Creatinine, Ser: 0.51 mg/dL (ref 0.4–1.2)
GFR calc non Af Amer: >60 mL/min (ref >60)
Glucose, Bld: 82 mg/dL (ref 70–99)

## 2010-04-21 LAB — URINE MICROSCOPIC-ADD ON

## 2010-04-21 LAB — DIFFERENTIAL
Basophils Absolute: 0 10*3/uL (ref 0.0–0.1)
Lymphocytes Relative: 24 % (ref 12–46)
Lymphs Abs: 1.7 10*3/uL (ref 0.7–4.0)
Monocytes Absolute: 0.6 10*3/uL (ref 0.1–1.0)
Neutro Abs: 4.6 10*3/uL (ref 1.7–7.7)

## 2010-04-21 LAB — PREGNANCY, URINE: Preg Test, Ur: POSITIVE

## 2010-04-21 LAB — GC/CHLAMYDIA PROBE AMP, GENITAL: GC Probe Amp, Genital: NEGATIVE

## 2010-04-21 LAB — HCG, QUANTITATIVE, PREGNANCY: hCG, Beta Chain, Quant, S: 183156 m[IU]/mL — ABNORMAL HIGH (ref <5)

## 2010-04-23 LAB — WET PREP, GENITAL: Yeast Wet Prep HPF POC: NONE SEEN

## 2010-04-23 LAB — CBC
HCT: 36 % (ref 36.0–46.0)
Hemoglobin: 12.3 g/dL (ref 12.0–15.0)
MCHC: 34.1 g/dL (ref 30.0–36.0)
Platelets: 226 10*3/uL (ref 150–400)
RDW: 13.7 % (ref 11.5–15.5)

## 2010-04-23 LAB — DIFFERENTIAL
Basophils Absolute: 0.1 10*3/uL (ref 0.0–0.1)
Eosinophils Absolute: 0.4 10*3/uL (ref 0.0–0.7)
Lymphs Abs: 2.8 10*3/uL (ref 0.7–4.0)
Monocytes Absolute: 0.6 10*3/uL (ref 0.1–1.0)
Neutro Abs: 3.2 10*3/uL (ref 1.7–7.7)

## 2010-04-23 LAB — URINALYSIS, ROUTINE W REFLEX MICROSCOPIC
Bilirubin Urine: NEGATIVE
Glucose, UA: NEGATIVE mg/dL
Hgb urine dipstick: NEGATIVE
Nitrite: NEGATIVE
Specific Gravity, Urine: 1.01 (ref 1.005–1.030)
pH: 6 (ref 5.0–8.0)

## 2010-04-23 LAB — GC/CHLAMYDIA PROBE AMP, GENITAL
Chlamydia, DNA Probe: NEGATIVE
GC Probe Amp, Genital: NEGATIVE

## 2010-04-24 LAB — URINALYSIS, ROUTINE W REFLEX MICROSCOPIC
Glucose, UA: NEGATIVE mg/dL
Protein, ur: NEGATIVE mg/dL
Specific Gravity, Urine: >1.03 — ABNORMAL HIGH (ref 1.005–1.030)
pH: 6 (ref 5.0–8.0)

## 2010-04-24 LAB — URINE MICROSCOPIC-ADD ON

## 2010-04-25 LAB — URINALYSIS, ROUTINE W REFLEX MICROSCOPIC
Glucose, UA: NEGATIVE mg/dL
Hgb urine dipstick: NEGATIVE
Ketones, ur: NEGATIVE mg/dL
Protein, ur: NEGATIVE mg/dL

## 2010-04-25 LAB — URINE MICROSCOPIC-ADD ON

## 2010-04-28 LAB — PREGNANCY, URINE: Preg Test, Ur: NEGATIVE

## 2010-04-28 LAB — URINALYSIS, ROUTINE W REFLEX MICROSCOPIC
Ketones, ur: 40 mg/dL — AB
Nitrite: POSITIVE — AB
Urobilinogen, UA: 2 mg/dL — ABNORMAL HIGH (ref 0.0–1.0)

## 2010-04-29 LAB — URINE CULTURE

## 2010-04-29 LAB — URINALYSIS, ROUTINE W REFLEX MICROSCOPIC
Hgb urine dipstick: NEGATIVE
Nitrite: NEGATIVE
Protein, ur: NEGATIVE mg/dL
Specific Gravity, Urine: 1.028 (ref 1.005–1.030)
Urobilinogen, UA: 1 mg/dL (ref 0.0–1.0)

## 2010-04-29 LAB — URINE MICROSCOPIC-ADD ON

## 2010-04-29 NOTE — Discharge Summary (Signed)
Krause Krause               ACCOUNT NO.:  [id number]  MEDICAL RECORD NO.:  [id number]           PATIENT TYPE:  I  LOCATION:  A304                          FACILITY:  [facility]  PHYSICIAN:  Krause Krause, MDDATE OF BIRTH:  11-17-1985  DATE OF ADMISSION:  03/24/2010 DATE OF DISCHARGE:  03/11/2012LH                              DISCHARGE SUMMARY   DISCHARGE DIAGNOSES: 1. Vomiting and right lower quadrant pain possibly mesenteric     adenitis. 2. Chronic headaches. 3. Hypertension.  DISCHARGE MEDICATIONS:  Phenergan 25 mg one half to one tablet by mouth every 6 hours as needed for nausea, hydrocodone/APAP 5/325 one to two p.o. q.6 h. p.r.n. pain 15 were dispensed.  Albuterol inhaler 1 puff q.4 hour p.r.n. shortness of breath, cyclobenzaprine 5 mg p.o. daily as needed for muscle spasm, Norvasc 5 mg a day, sumatriptan  50 mg as needed, for migraines Tylenol 500 mg p.o. q.8 h p.r.n. pain.  CONDITION:  Stable.  ACTIVITY:  Ad lib.  FOLLOWUP:  Follow up with Dr. Gerilyn Krause.  She is to call for appointment for her chronic headaches and migraines.  CONSULTATIONS:  Dr. Jena Krause.  PROCEDURES:  EGD which was normal.  LABORATORY DATA:  CBC, CMET, lipase, urinalysis, urine pregnancy, amylase, blood cultures are all normal.  Diagnostic CT scan of the abdomen and pelvis showed numerous but normal size mesenteric lymph nodes in the right mid abdomen and medial colon, nonspecific but can be seen with mesenteric adenitis.  No evidence of appendicitis, prominent pylorus without surrounding inflammatory changes.  Question related to contraction.  Chest x-ray showed moderate hyperinflation.  HISTORY AND HOSPITAL COURSE:  Please see H and P for details.  Krause Krause and is a 25 year old black female with a history of chronic migraines who presents with right lower quadrant pain which was severe as well as nausea and vomiting.  She had unremarkable vital signs, normal labs, and a CAT scan  which showed nonspecific right lower quadrant lymph nodes but no evidence of appendicitis.  She had soft abdomen with right lower quadrant tenderness and no cervical motion tenderness on bimanual exam.  She was admitted to the hospitalist service for pain medication and antiemetics.  She continued to complain of anorexia, nausea, vomiting and pain.  GI was consulted and performed EGD which was normal.  Her symptoms improved.  But she continued to her request antiemtics and pain medication.  This may be mesenteric adenitis but she has had no diarrhea or other colitis symptoms.  She complained of a migraine headache, photophobia, and phonophobia. She has not had vomiting with her migraines in the past except when they were very severe.  She takes Fioricet and Sumatriptan at home and received Sumatriptan and Fioricet here.  She continued to complain of headache.  Upon further questioning, it is apparent that she is taking her Imitrex and Fioricet daily.  I instructed her on the proper use of Sumatriptan and Fioricet and recommended that she follow up with headache specialist.  Apparently, she has no primary care physician, and Dr. Emelda Krause has been providing the headaches treatment.  She is requesting referral and  I have given her a referral for Dr. Gerilyn Krause. At the time of discharge, she appears comfortable.  Her abdomen is soft and nontender.  She is tolerating a solid diet, and it has been cleared by GI for discharge.     Krause Kem L. Lendell Caprice, MD     CLS/MEDQ  D:  03/28/2010  T:  03/29/2010  Job:  [id number]  cc:   Dr. Emelda Krause  Dr. Gerilyn Krause  Electronically Signed by Krause Curb MD on 04/29/2010 11:07:12 AM

## 2010-05-12 ENCOUNTER — Emergency Department (HOSPITAL_COMMUNITY)
Admission: EM | Admit: 2010-05-12 | Discharge: 2010-05-12 | Disposition: A | Discharge status: Home / Self Care | Payer: Medicaid Other | Attending: Emergency Medicine | Admitting: Emergency Medicine

## 2010-05-12 DIAGNOSIS — Y92009 Unspecified place in unspecified non-institutional (private) residence as the place of occurrence of the external cause: Secondary | ICD-10-CM | POA: Insufficient documentation

## 2010-05-12 DIAGNOSIS — S335XXA Sprain of ligaments of lumbar spine, initial encounter: Secondary | ICD-10-CM | POA: Insufficient documentation

## 2010-05-12 DIAGNOSIS — I10 Essential (primary) hypertension: Secondary | ICD-10-CM | POA: Insufficient documentation

## 2010-05-12 DIAGNOSIS — J45909 Unspecified asthma, uncomplicated: Secondary | ICD-10-CM | POA: Insufficient documentation

## 2010-05-12 DIAGNOSIS — X500XXA Overexertion from strenuous movement or load, initial encounter: Secondary | ICD-10-CM | POA: Insufficient documentation

## 2010-06-04 ENCOUNTER — Emergency Department (HOSPITAL_COMMUNITY): Payer: Medicaid Other

## 2010-06-04 ENCOUNTER — Emergency Department (HOSPITAL_COMMUNITY)
Admission: EM | Admit: 2010-06-04 | Discharge: 2010-06-04 | Disposition: A | Discharge status: Home / Self Care | Payer: Medicaid Other | Attending: Emergency Medicine | Admitting: Emergency Medicine

## 2010-06-04 DIAGNOSIS — M545 Low back pain, unspecified: Secondary | ICD-10-CM | POA: Insufficient documentation

## 2010-06-04 LAB — URINALYSIS, ROUTINE W REFLEX MICROSCOPIC
Bilirubin Urine: NEGATIVE
Nitrite: NEGATIVE
Specific Gravity, Urine: >1.03 — ABNORMAL HIGH (ref 1.005–1.030)
Urobilinogen, UA: 0.2 mg/dL (ref 0.0–1.0)

## 2010-06-04 LAB — URINE MICROSCOPIC-ADD ON

## 2010-06-04 LAB — POCT PREGNANCY, URINE: Preg Test, Ur: NEGATIVE

## 2010-06-04 NOTE — Op Note (Signed)
[facility]  Patient:    Tanya Krause, Tanya Krause Visit Number: [id number] MRN: [id number]          Service Type: OBS Location: 4A A414 01 Attending Physician:  Tilda Burrow Dictated by:   Christin Bach, M.D. Admit Date:  01/19/2001 Discharge Date: 01/20/2001                             Operative Report  DELIVERY TIME:  3:18 a.m.  OBSTETRICIAN:  Christin Bach, M.D.  LABOR SUMMARY AND DELIVERY NOTE:  The patient was admitted and as described in the admitting history.  Amniotomy was performed and Pitocin augmentation of labor initiated.  She progressed easily through the first few hours, reaching 6-cm dilated by 1 a.m.  There was some concern as to whether descent of the baby would be successful or not.  Fortunately, the babys head molded tremendously and the she reached completely dilated approximately 2:30 a.m. After a one-hour second stage, she delivered from a direct OA position over a small episiotomy with a significant extension, second degree.  The infant was a healthy female infant, Apgars 8/9, weight 6 pounds 2.9 ounces, 2804 g.  The infant did well.  There was a tight nuchal cord x1 which was handled by delivering the baby and maintaining the baby close to mothers body.  Once the baby was out, the cord was milked toward the infant as it was placed on maternal abdomen and the baby showed excellent respiratory activity.  Amniotic fluid was clear without malodor.  The cord was clamped, cut by one of the family members, then the placenta delivered by [facility] presentation. Episiotomy and laceration were repaired easily using continuous 2-0 Vicryl. Patient was supported by at least four family members in the room at the delivery. Dictated by:   Christin Bach, M.D. Attending Physician:  Tilda Burrow DD:  01/21/01 TD:  01/21/01 Job: [id number]

## 2010-06-04 NOTE — H&P (Signed)
[facility]  Patient:    Tanya Krause, Tanya Krause Visit Number: [id number] MRN: [id number]          Service Type: OBS Location: 4A A428 01 Attending Physician:  Tilda Burrow Dictated by:   Christin Bach, M.D. Admit Date:  01/20/2001 Discharge Date: 01/22/2001                           History and Physical  ADMITTING DIAGNOSIS:  Pregnancy at 38 weeks, advanced cervical favor ability, latent phase labor.  HISTORY OF PRESENT ILLNESS:  This 25 year old female, gravida 1, para 0, LMP April 27, 2000, placing [facility] February 01, 2001, with ultrasound [facility] corresponding is admitted after a pregnancy course followed through our office with 15 prenatal visits, appropriate weight gain and fundal height.  The patient is admitted after being seen with mild uterine contractions, presenting to labor and delivering complaining of yellowish vaginal discharge felt to represent loss of the mucous plug.  Cervix exam shows the cervix to be 4 cm, 50%, -1 station, vertex presentation well applied.  The membranes were ruptured and that fluid is clear.  The patient is having mild uterine contractions every four or five minutes and is tolerating this quite well. The remaining identifiable fetal heart rate tracing is reactive.  The patient is admitted for labor management and Pitocin augmentation of labor.  The patient is admitted for probable vaginal delivery.  PRENATAL LABORATORY DATA:  Blood type B positive, urine drug screen negative. Rubella rubella immunity present.  Hemoglobin 11, hematocrit 33.  Hepatitis and HIV negative. MSAFP negative.  Group B strep negative. Glucose tolerance test 104 mg%.  Sickle index negative.  PHYSICAL EXAMINATION:  VITAL SIGNS:  Height 5 feet, weight 139 which is a 22 pound weight gain, blood pressure 121/66, respirations 16, pulse 100, temperature 98.0.  GENERAL:  She is a healthy, comfortable, calm, oriented African-American female in  minimal discomfort with contractions, oriented x3.  ABDOMEN:  Abdominal exam shows estimated fetal weight of 7 pounds.  PELVIC:  Cervix 4, 50%, -1.  PLAN:  Pitocin augmentation.  Uncertain of analgesia plans.  May require epidural if requested later. Dictated by:   Christin Bach, M.D. Attending Physician:  Tilda Burrow DD:  01/20/01 TD:  01/20/01 Job: [id number]

## 2010-06-04 NOTE — H&P (Signed)
NAMEKHRYSTINA, Krause               ACCOUNT NO.:  [id number]   MEDICAL RECORD NO.:  [id number]          PATIENT TYPE:  OIB   LOCATION:  LDR1                          FACILITY:  [facility]   PHYSICIAN:  Tilda Burrow, M.D. DATE OF BIRTH:  01/10/1986   DATE OF ADMISSION:  11/03/2004  DATE OF DISCHARGE:  LH                                HISTORY & PHYSICAL   REASON FOR ADMISSION:  Pregnancy at 36 weeks with back pain and mild  irritable-type contractions.   HISTORY OF PRESENT ILLNESS:  Tanya Krause is a 25 year old patient, gravida 3,  para 1, who is at [redacted] weeks gestation, who presents to the hospital  complaining of low back pain and some irritable type contractions.  The  cervix is approximately 3 cm thick, -2 station, and has been unchanged for  the past 2-1/2 hours.  Electronic fetal monitoring shows the fetal heart  rate pattern is stable with accelerations noted, no decelerations, and a  mild irritable-type pattern of uterine contractions lasting no more than 15-  20 seconds.  [place] was getting prenatal care in [facility], and has since  moved back down here to help take care of her mother, and since moving back  down here has had limited care.  I have made arrangements through our office  for her to be seen Friday at 2 p.m. by the other midwife, __________, to be  evaluated, and she does have an appointment card for that and knows of her  appointment and is willing to attend our office.   PLAN:  We are going over therapeutic risks and will discharge her home to be  evaluated in our office Friday for follow up.      Zerita Boers, Lanier Clam      Tilda Burrow, M.D.  Electronically Signed    DL/MEDQ  D:  16/10/9602  T:  11/03/2004  Job:  [id number]   cc:   [facility]

## 2010-06-04 NOTE — Op Note (Signed)
NAMESCARLETTE, HOGSTON               ACCOUNT NO.:  [id number]   MEDICAL RECORD NO.:  [id number]          PATIENT TYPE:  OIB   LOCATION:  LDR3                          FACILITY:  [facility]   PHYSICIAN:  Tilda Burrow, M.D. DATE OF BIRTH:  1985-05-24   DATE OF PROCEDURE:  11/04/2004  DATE OF DISCHARGE:  11/04/2004                                 OPERATIVE REPORT   Pamalee came to the office today after being seen at labor and delivery  yesterday. She is a gravida 2, para 3 that is about 36 weeks 1 day  gestation. She had been receiving prenatal care in [facility]. However, she  has since moved back home to [place] with her mother. So she plans to  continue prenatal care with [facility].   Her prenatal course is positive for preterm labor beginning at 32 weeks. She  stated she spent a week in the hospital, received betamethasone, IV  antibiotics, and tocolytics and was discharged home at 2 cm, 50%, -1  station. She states that two weeks ago she went to [facility] with  questionable labor and said that her inner os was 4 cm and her outer os was  3 cm. Today in the office after complaints of back pain, I checked her  cervix, and she is noted to be 4 cm, 50% effaced, -2 station. She states  that last night per RN exam was 3 cm. For this reason, she was sent over to  labor and deliver for evaluation. She is not having any contractions on the  monitor. There were no palpable contractions. She does complain of a  constant back ache. I have checked her a couple of more times. She is a real  stretchy, easy 4 to 5 cm. I cannot say for sure that this is a big change  from when I examined her earlier. Again, there are no palpable or  moniterable contractions. She does state also that she has not been able to  keep anything down since yesterday despite p.o. Phenergan. Urine dip  positive for the maximum amount of ketones. So I am going to admit her for  observation tonight to watch for  cervical change and also to treat her  dehydration.   IMPRESSION:  Intrauterine pregnancy at 36 weeks 1 day gestation. Advanced  cervical dilatation, seemingly without labor. Nausea, vomiting, and  dehydration.   PLAN:  IV fluids and antiemetics. Will try a diet as tolerated and will plan  to just keep an eye on her cervix. I did discuss that augmentation would not  be possible due to her preterm status, and even though she did not like this  too much, she realizes that is the way it is going to be.  Addendum:  JvFerguson has evaluated the patient and since she is  subjectively feeling better than yesterday, will send her home to re-hydrate  orally.  jvf      Zenovia Jordan, P.A.      Tilda Burrow, M.D.  Electronically Signed   RRK/MEDQ  D:  11/04/2004  T:  11/05/2004  Job:  [id number]

## 2010-06-07 ENCOUNTER — Emergency Department (HOSPITAL_COMMUNITY): Payer: Medicaid Other

## 2010-06-07 ENCOUNTER — Emergency Department (HOSPITAL_COMMUNITY)
Admission: EM | Admit: 2010-06-07 | Discharge: 2010-06-07 | Disposition: A | Discharge status: Home / Self Care | Payer: Medicaid Other | Attending: Emergency Medicine | Admitting: Emergency Medicine

## 2010-06-07 ENCOUNTER — Encounter (HOSPITAL_COMMUNITY): Payer: Self-pay | Admitting: Radiology

## 2010-06-07 DIAGNOSIS — R059 Cough, unspecified: Secondary | ICD-10-CM | POA: Insufficient documentation

## 2010-06-07 DIAGNOSIS — R0602 Shortness of breath: Secondary | ICD-10-CM | POA: Insufficient documentation

## 2010-06-07 DIAGNOSIS — B029 Zoster without complications: Secondary | ICD-10-CM | POA: Insufficient documentation

## 2010-06-07 DIAGNOSIS — I10 Essential (primary) hypertension: Secondary | ICD-10-CM | POA: Insufficient documentation

## 2010-06-07 DIAGNOSIS — R05 Cough: Secondary | ICD-10-CM | POA: Insufficient documentation

## 2010-06-07 DIAGNOSIS — J45909 Unspecified asthma, uncomplicated: Secondary | ICD-10-CM | POA: Insufficient documentation

## 2010-06-07 DIAGNOSIS — Z79899 Other long term (current) drug therapy: Secondary | ICD-10-CM | POA: Insufficient documentation

## 2010-06-07 DIAGNOSIS — R0789 Other chest pain: Secondary | ICD-10-CM | POA: Insufficient documentation

## 2010-06-07 HISTORY — DX: Essential (primary) hypertension: I10

## 2010-06-07 LAB — URINALYSIS, ROUTINE W REFLEX MICROSCOPIC
Nitrite: NEGATIVE
Specific Gravity, Urine: 1.01 (ref 1.005–1.030)
Urobilinogen, UA: 0.2 mg/dL (ref 0.0–1.0)
pH: 6.5 (ref 5.0–8.0)

## 2010-06-07 LAB — URINE MICROSCOPIC-ADD ON

## 2010-06-07 LAB — POCT PREGNANCY, URINE: Preg Test, Ur: NEGATIVE

## 2010-06-07 MED ORDER — IOHEXOL 350 MG/ML SOLN
100.0000 mL | Freq: Once | INTRAVENOUS | Status: AC | PRN
  Administered 2010-06-07: 100 mL via INTRAVENOUS

## 2010-06-10 ENCOUNTER — Emergency Department (HOSPITAL_COMMUNITY): Payer: Medicaid Other

## 2010-06-10 ENCOUNTER — Emergency Department (HOSPITAL_COMMUNITY)
Admission: EM | Admit: 2010-06-10 | Discharge: 2010-06-10 | Disposition: A | Discharge status: Home / Self Care | Payer: Medicaid Other | Attending: Emergency Medicine | Admitting: Emergency Medicine

## 2010-06-10 DIAGNOSIS — R059 Cough, unspecified: Secondary | ICD-10-CM | POA: Insufficient documentation

## 2010-06-10 DIAGNOSIS — R0602 Shortness of breath: Secondary | ICD-10-CM | POA: Insufficient documentation

## 2010-06-10 DIAGNOSIS — R55 Syncope and collapse: Secondary | ICD-10-CM | POA: Insufficient documentation

## 2010-06-10 DIAGNOSIS — R209 Unspecified disturbances of skin sensation: Secondary | ICD-10-CM | POA: Insufficient documentation

## 2010-06-10 DIAGNOSIS — R05 Cough: Secondary | ICD-10-CM | POA: Insufficient documentation

## 2010-06-10 DIAGNOSIS — I10 Essential (primary) hypertension: Secondary | ICD-10-CM | POA: Insufficient documentation

## 2010-06-10 DIAGNOSIS — J45909 Unspecified asthma, uncomplicated: Secondary | ICD-10-CM | POA: Insufficient documentation

## 2010-06-10 DIAGNOSIS — B029 Zoster without complications: Secondary | ICD-10-CM | POA: Insufficient documentation

## 2010-06-10 LAB — DIFFERENTIAL
Basophils Absolute: 0 10*3/uL (ref 0.0–0.1)
Basophils Relative: 1 % (ref 0–1)
Eosinophils Absolute: 0.5 10*3/uL (ref 0.0–0.7)
Eosinophils Relative: 8 % — ABNORMAL HIGH (ref 0–5)
Monocytes Absolute: 0.5 10*3/uL (ref 0.1–1.0)
Monocytes Relative: 8 % (ref 3–12)

## 2010-06-10 LAB — CBC
Hemoglobin: 12.7 g/dL (ref 12.0–15.0)
MCH: 27.5 pg (ref 26.0–34.0)
MCHC: 34.6 g/dL (ref 30.0–36.0)
RDW: 13.4 % (ref 11.5–15.5)

## 2010-06-10 LAB — BASIC METABOLIC PANEL
BUN: 4 mg/dL — ABNORMAL LOW (ref 6–23)
Calcium: 10.2 mg/dL (ref 8.4–10.5)
Chloride: 104 mEq/L (ref 96–112)
Creatinine, Ser: 0.57 mg/dL (ref 0.4–1.2)

## 2010-06-10 LAB — D-DIMER, QUANTITATIVE: D-Dimer, Quant: 0.5 ug/mL-FEU — ABNORMAL HIGH (ref 0.00–0.48)

## 2010-06-29 ENCOUNTER — Emergency Department (HOSPITAL_COMMUNITY): Payer: Medicaid Other

## 2010-06-29 ENCOUNTER — Emergency Department (HOSPITAL_COMMUNITY)
Admission: EM | Admit: 2010-06-29 | Discharge: 2010-06-29 | Disposition: A | Discharge status: Home / Self Care | Payer: Medicaid Other | Attending: Emergency Medicine | Admitting: Emergency Medicine

## 2010-06-29 DIAGNOSIS — I10 Essential (primary) hypertension: Secondary | ICD-10-CM | POA: Insufficient documentation

## 2010-06-29 DIAGNOSIS — D573 Sickle-cell trait: Secondary | ICD-10-CM | POA: Insufficient documentation

## 2010-06-29 DIAGNOSIS — Z79899 Other long term (current) drug therapy: Secondary | ICD-10-CM | POA: Insufficient documentation

## 2010-06-29 DIAGNOSIS — J45909 Unspecified asthma, uncomplicated: Secondary | ICD-10-CM | POA: Insufficient documentation

## 2010-06-29 DIAGNOSIS — R110 Nausea: Secondary | ICD-10-CM | POA: Insufficient documentation

## 2010-06-29 DIAGNOSIS — R109 Unspecified abdominal pain: Secondary | ICD-10-CM | POA: Insufficient documentation

## 2010-06-29 LAB — CBC
MCH: 27.7 pg (ref 26.0–34.0)
MCHC: 34.8 g/dL (ref 30.0–36.0)
Platelets: 250 10*3/uL (ref 150–400)
RBC: 4.29 MIL/uL (ref 3.87–5.11)

## 2010-06-29 LAB — DIFFERENTIAL
Basophils Absolute: 0 10*3/uL (ref 0.0–0.1)
Basophils Relative: 1 % (ref 0–1)
Eosinophils Absolute: 0.2 10*3/uL (ref 0.0–0.7)
Monocytes Absolute: 0.5 10*3/uL (ref 0.1–1.0)
Monocytes Relative: 9 % (ref 3–12)
Neutrophils Relative %: 34 % — ABNORMAL LOW (ref 43–77)

## 2010-06-29 LAB — URINALYSIS, ROUTINE W REFLEX MICROSCOPIC
Bilirubin Urine: NEGATIVE
Glucose, UA: NEGATIVE mg/dL
Hgb urine dipstick: NEGATIVE
Specific Gravity, Urine: 1.02 (ref 1.005–1.030)
pH: 7 (ref 5.0–8.0)

## 2010-06-29 LAB — COMPREHENSIVE METABOLIC PANEL
ALT: 7 U/L (ref 0–35)
AST: 15 U/L (ref 0–37)
Calcium: 9.5 mg/dL (ref 8.4–10.5)
Sodium: 138 mEq/L (ref 135–145)
Total Protein: 6.8 g/dL (ref 6.0–8.3)

## 2010-06-29 MED ORDER — IOHEXOL 300 MG/ML  SOLN
100.0000 mL | Freq: Once | INTRAMUSCULAR | Status: AC | PRN
  Administered 2010-06-29: 100 mL via INTRAVENOUS

## 2010-06-30 LAB — GC/CHLAMYDIA PROBE AMP, GENITAL
Chlamydia, DNA Probe: NEGATIVE
GC Probe Amp, Genital: NEGATIVE

## 2010-07-05 ENCOUNTER — Emergency Department (HOSPITAL_COMMUNITY)
Admission: EM | Admit: 2010-07-05 | Discharge: 2010-07-05 | Disposition: A | Discharge status: Home / Self Care | Payer: Medicaid Other | Attending: Emergency Medicine | Admitting: Emergency Medicine

## 2010-07-05 DIAGNOSIS — R51 Headache: Secondary | ICD-10-CM | POA: Insufficient documentation

## 2010-07-05 DIAGNOSIS — J45909 Unspecified asthma, uncomplicated: Secondary | ICD-10-CM | POA: Insufficient documentation

## 2010-07-05 DIAGNOSIS — I10 Essential (primary) hypertension: Secondary | ICD-10-CM | POA: Insufficient documentation

## 2010-07-05 DIAGNOSIS — R109 Unspecified abdominal pain: Secondary | ICD-10-CM | POA: Insufficient documentation

## 2010-07-05 DIAGNOSIS — M549 Dorsalgia, unspecified: Secondary | ICD-10-CM | POA: Insufficient documentation

## 2010-07-05 DIAGNOSIS — Z79899 Other long term (current) drug therapy: Secondary | ICD-10-CM | POA: Insufficient documentation

## 2010-07-05 DIAGNOSIS — T7840XA Allergy, unspecified, initial encounter: Secondary | ICD-10-CM | POA: Insufficient documentation

## 2010-07-05 DIAGNOSIS — M542 Cervicalgia: Secondary | ICD-10-CM | POA: Insufficient documentation

## 2010-07-05 DIAGNOSIS — D573 Sickle-cell trait: Secondary | ICD-10-CM | POA: Insufficient documentation

## 2010-07-05 LAB — CBC
HCT: 35.5 % — ABNORMAL LOW (ref 36.0–46.0)
Platelets: 251 10*3/uL (ref 150–400)
RDW: 13.3 % (ref 11.5–15.5)
WBC: 5.6 10*3/uL (ref 4.0–10.5)

## 2010-07-05 LAB — BASIC METABOLIC PANEL
BUN: 8 mg/dL (ref 6–23)
GFR calc Af Amer: >60 mL/min (ref >60)
GFR calc non Af Amer: >60 mL/min (ref >60)
Potassium: 3.4 mEq/L — ABNORMAL LOW (ref 3.5–5.1)
Sodium: 135 mEq/L (ref 135–145)

## 2010-07-05 LAB — URINALYSIS, ROUTINE W REFLEX MICROSCOPIC
Bilirubin Urine: NEGATIVE
Leukocytes, UA: NEGATIVE
Nitrite: NEGATIVE
Specific Gravity, Urine: 1.02 (ref 1.005–1.030)
Urobilinogen, UA: 1 mg/dL (ref 0.0–1.0)

## 2010-07-05 LAB — DIFFERENTIAL
Basophils Absolute: 0 10*3/uL (ref 0.0–0.1)
Eosinophils Relative: 4 % (ref 0–5)
Lymphocytes Relative: 51 % — ABNORMAL HIGH (ref 12–46)
Neutrophils Relative %: 35 % — ABNORMAL LOW (ref 43–77)

## 2010-07-20 ENCOUNTER — Emergency Department (HOSPITAL_COMMUNITY): Payer: Medicaid Other

## 2010-07-20 ENCOUNTER — Emergency Department (HOSPITAL_COMMUNITY)
Admission: EM | Admit: 2010-07-20 | Discharge: 2010-07-20 | Disposition: A | Discharge status: Home / Self Care | Payer: Medicaid Other | Attending: Emergency Medicine | Admitting: Emergency Medicine

## 2010-07-20 DIAGNOSIS — G43909 Migraine, unspecified, not intractable, without status migrainosus: Secondary | ICD-10-CM | POA: Insufficient documentation

## 2010-07-20 DIAGNOSIS — M545 Low back pain, unspecified: Secondary | ICD-10-CM | POA: Insufficient documentation

## 2010-07-20 DIAGNOSIS — Z79899 Other long term (current) drug therapy: Secondary | ICD-10-CM | POA: Insufficient documentation

## 2010-07-20 LAB — URINALYSIS, ROUTINE W REFLEX MICROSCOPIC
Hgb urine dipstick: NEGATIVE
Nitrite: NEGATIVE
Specific Gravity, Urine: 1.02 (ref 1.005–1.030)
Urobilinogen, UA: 1 mg/dL (ref 0.0–1.0)

## 2010-07-20 LAB — URINE MICROSCOPIC-ADD ON

## 2010-08-11 ENCOUNTER — Other Ambulatory Visit: Payer: Self-pay

## 2010-08-11 ENCOUNTER — Emergency Department (HOSPITAL_COMMUNITY)
Admission: EM | Admit: 2010-08-11 | Discharge: 2010-08-12 | Disposition: A | Discharge status: Home / Self Care | Payer: Medicaid Other | Attending: Emergency Medicine | Admitting: Emergency Medicine

## 2010-08-11 ENCOUNTER — Encounter (HOSPITAL_COMMUNITY): Payer: Self-pay | Admitting: Emergency Medicine

## 2010-08-11 DIAGNOSIS — I10 Essential (primary) hypertension: Secondary | ICD-10-CM | POA: Insufficient documentation

## 2010-08-11 DIAGNOSIS — B349 Viral infection, unspecified: Secondary | ICD-10-CM

## 2010-08-11 DIAGNOSIS — B0229 Other postherpetic nervous system involvement: Secondary | ICD-10-CM

## 2010-08-11 DIAGNOSIS — G43909 Migraine, unspecified, not intractable, without status migrainosus: Secondary | ICD-10-CM

## 2010-08-11 DIAGNOSIS — K5289 Other specified noninfective gastroenteritis and colitis: Secondary | ICD-10-CM | POA: Insufficient documentation

## 2010-08-11 DIAGNOSIS — J069 Acute upper respiratory infection, unspecified: Secondary | ICD-10-CM | POA: Insufficient documentation

## 2010-08-11 DIAGNOSIS — B9789 Other viral agents as the cause of diseases classified elsewhere: Secondary | ICD-10-CM | POA: Insufficient documentation

## 2010-08-11 DIAGNOSIS — F17200 Nicotine dependence, unspecified, uncomplicated: Secondary | ICD-10-CM | POA: Insufficient documentation

## 2010-08-11 MED ORDER — ONDANSETRON HCL 4 MG/2ML IJ SOLN
4.0000 mg | Freq: Once | INTRAMUSCULAR | Status: AC
  Administered 2010-08-12: 4 mg via INTRAVENOUS
  Filled 2010-08-11: qty 2

## 2010-08-11 MED ORDER — PSEUDOEPHEDRINE HCL 60 MG PO TABS: 60.0000 mg | ORAL_TABLET | Freq: Four times a day (QID) | ORAL | Status: AC | PRN

## 2010-08-11 MED ORDER — PROMETHAZINE HCL 25 MG/ML IJ SOLN
25.0000 mg | Freq: Once | INTRAMUSCULAR | Status: AC
  Administered 2010-08-11: 25 mg via INTRAVENOUS
  Filled 2010-08-11: qty 1

## 2010-08-11 MED ORDER — HYDROCODONE-ACETAMINOPHEN 5-325 MG PO TABS
2.0000 | ORAL_TABLET | Freq: Once | ORAL | Status: AC
  Administered 2010-08-12: 2 via ORAL
  Filled 2010-08-11: qty 2

## 2010-08-11 MED ORDER — ONDANSETRON HCL 4 MG PO TABS: 8.0000 mg | ORAL_TABLET | Freq: Three times a day (TID) | ORAL | Status: AC | PRN

## 2010-08-11 MED ORDER — HYDROCODONE-ACETAMINOPHEN 5-325 MG PO TABS: 2.0000 | ORAL_TABLET | ORAL | Status: AC | PRN

## 2010-08-11 MED ORDER — DIPHENHYDRAMINE HCL 50 MG/ML IJ SOLN
25.0000 mg | Freq: Once | INTRAMUSCULAR | Status: AC
  Administered 2010-08-11: 25 mg via INTRAVENOUS
  Filled 2010-08-11: qty 1

## 2010-08-11 MED ORDER — SODIUM CHLORIDE 0.9 % IV BOLUS (SEPSIS)
1000.0000 mL | Freq: Once | INTRAVENOUS | Status: AC
  Administered 2010-08-11: 1000 mL via INTRAVENOUS

## 2010-08-11 MED ORDER — DEXAMETHASONE SODIUM PHOSPHATE 4 MG/ML IJ SOLN
8.0000 mg | Freq: Once | INTRAMUSCULAR | Status: AC
  Administered 2010-08-11: 8 mg via INTRAVENOUS
  Filled 2010-08-11: qty 2

## 2010-08-11 NOTE — ED Notes (Signed)
Patient and family member do not need anything at this time.

## 2010-08-11 NOTE — ED Notes (Signed)
Pt reports hx of shingles.  Pt states that her rt upper back began hurting yesterday.  States that she had some sob at home today.  Reports headache, and sensitivity to light.  nad noted

## 2010-08-11 NOTE — ED Notes (Signed)
Pt reports burning painful sensation in neck and back.  Also reporting some congestion.  States that he does have a history of shingles, and experienced the same symptoms on last outbreak.  Denies nausea at this time. No apparent distress noted.

## 2010-08-11 NOTE — ED Notes (Signed)
Patient reports chest discomfort, cough, and congestion. Patient states history of shingles and these symptoms are the same as when she got shingles the last time. Patient reports single raised pustule on back of neck. No apparent lesions that appear like shingles in triage.

## 2010-08-11 NOTE — ED Provider Notes (Signed)
History     Chief Complaint  Patient presents with  . Chest Pain   Patient is a 25 y.o. female presenting with headaches. The history is provided by the patient. No language interpreter was used.  Headache  This is a new problem. Episode onset: today. The problem occurs constantly. The problem has not changed since onset.The headache is associated with nothing. Quality: "like previous migraines" The pain is at a severity of 10/10. The pain is severe. The pain does not radiate. Associated symptoms include nausea and vomiting. Pertinent negatives include no fever, no syncope and no shortness of breath.  Patient reports she began feeling congested and coughing yesterday with new onset of HA similar to previous migraines experienced previously with chest discomfort, nausea, vomiting 2x and back pain this morning. States symptoms are similar to those experienced previously which preceded onset of shingles. Notes chest discomfort has improved significantly since onset at this time.  Notes nausea not relieved with antiemetics. Patient also notes scattered red bumps to right lower leg several days ago and persistent since.  Patient seen at 8:23 PM   Past Medical History  Diagnosis Date  . Hypertension   . Migraine     Past Surgical History  Procedure Date  . Tubal ligation     No family history on file.  History  Substance Use Topics  . Smoking status: Current Some Day Smoker  . Smokeless tobacco: Not on file  . Alcohol Use: Yes     occasionally    OB History    Grav Para Term Preterm Abortions TAB SAB Ect Mult Living                  Review of Systems  Constitutional: Negative for fever.  HENT: Positive for congestion.   Respiratory: Positive for cough. Negative for shortness of breath.   Cardiovascular: Positive for chest pain. Negative for syncope.  Gastrointestinal: Positive for nausea and vomiting. Negative for diarrhea.  Musculoskeletal: Positive for back pain.    Neurological: Positive for headaches.  All other systems reviewed and are negative.  All other systems negative except as noted in HPI.    Physical Exam  BP 123/95  Pulse 106  Temp(Src) 98.4 F (36.9 C) (Oral)  Resp 18  Wt 158 lb (71.668 kg)  SpO2 100%  LMP 07/12/2010  Physical Exam  Nursing note and vitals reviewed. Constitutional: She is oriented to person, place, and time. She appears well-developed and well-nourished. No distress.  HENT:  Head: Normocephalic and atraumatic.  Right Ear: Tympanic membrane normal.  Left Ear: Tympanic membrane normal.  Eyes: Conjunctivae are normal. Pupils are equal, round, and reactive to light.  Neck: Normal range of motion. Neck supple.       Tender lymphadenopathy at right anterior cervical chain.  Cardiovascular: Normal rate, regular rhythm and normal heart sounds.  Exam reveals no gallop and no friction rub.   No murmur heard. Pulmonary/Chest: Effort normal and breath sounds normal. She has no wheezes. She has no rales.  Abdominal: Soft. Bowel sounds are normal. There is no tenderness.  Musculoskeletal: Normal range of motion. She exhibits no edema.  Lymphadenopathy:    She has cervical adenopathy.  Neurological: She is alert and oriented to person, place, and time. No sensory deficit.  Skin: Skin is warm and dry.       Along T3 dermatome there are faint erythematous macules. Faint erythematous macular lesions to right lower leg.   Psychiatric: She has a normal mood  and affect. Her behavior is normal.    ED Course  Procedures  MDM Viral infection, gastroenteritis, migraine headache, varicella zoster, upper respiratory infection, insect bites all entertained in the patient's differential diagnosis among other possible etiologies.    Chart written by Clarita Crane acting as scribe for Felisa Bonier, MD  I personally performed the services described in this documentation, which was scribed in my presence. The recorded  information has been reviewed and considered. Felisa Bonier, MD  The patient appears to have a viral syndrome with upper respiratory congestion, gastroenteritis, as well as postherpetic neuralgia of her back. She is otherwise stable in her headache which appears to have been a migraine headache has improved. I will discharge her home with a prescription for a decongestant, analgesic, an anti-emetic. I reviewed the findings and plan of care with the patient who states her understanding of and agreement with this plan of care.  Felisa Bonier, MD 08/11/10 [phone]

## 2010-08-11 NOTE — ED Notes (Signed)
Family at bedside. Patient wanted something to eat and drink which Janette approved. Patient comfortable does not need anything at this time.

## 2010-09-28 ENCOUNTER — Emergency Department (HOSPITAL_COMMUNITY)
Admission: EM | Admit: 2010-09-28 | Discharge: 2010-09-28 | Disposition: A | Discharge status: Home / Self Care | Payer: Medicaid Other | Attending: Emergency Medicine | Admitting: Emergency Medicine

## 2010-09-28 ENCOUNTER — Encounter (HOSPITAL_COMMUNITY): Payer: Self-pay | Admitting: *Deleted

## 2010-09-28 DIAGNOSIS — I10 Essential (primary) hypertension: Secondary | ICD-10-CM | POA: Insufficient documentation

## 2010-09-28 DIAGNOSIS — Z888 Allergy status to other drugs, medicaments and biological substances status: Secondary | ICD-10-CM | POA: Insufficient documentation

## 2010-09-28 DIAGNOSIS — J069 Acute upper respiratory infection, unspecified: Secondary | ICD-10-CM | POA: Insufficient documentation

## 2010-09-28 DIAGNOSIS — Z91013 Allergy to seafood: Secondary | ICD-10-CM | POA: Insufficient documentation

## 2010-09-28 DIAGNOSIS — J45909 Unspecified asthma, uncomplicated: Secondary | ICD-10-CM | POA: Insufficient documentation

## 2010-09-28 DIAGNOSIS — F17200 Nicotine dependence, unspecified, uncomplicated: Secondary | ICD-10-CM | POA: Insufficient documentation

## 2010-09-28 DIAGNOSIS — G43909 Migraine, unspecified, not intractable, without status migrainosus: Secondary | ICD-10-CM | POA: Insufficient documentation

## 2010-09-28 NOTE — ED Provider Notes (Signed)
History   Chart scribed for Hilario Quarry, MD by Enos Fling; the patient was seen in room [facility]; this patient's care was started at 1:20 PM.    CSN: [id number] Arrival date & time: 09/28/2010  1:09 PM  Chief Complaint  Patient presents with  . Chest Pain  . Nasal Congestion  . Cough   HPI Tanya Krause is a 25 y.o. female with h/o asthma who presents to the Emergency Department complaining of cough. Pt c/o cough and nasal congestion with associated body aches and chest soreness, persistent x 2 days. No fever or sob. No sick contacts. Pt has tried tylenol with minimal relief, last taken 5 hours ago. No exacerbating factors.    Past Medical History  Diagnosis Date  . Hypertension   . Migraine   . Asthma     Past Surgical History  Procedure Date  . Tubal ligation     History reviewed. No pertinent family history.  History  Substance Use Topics  . Smoking status: Current Some Day Smoker  . Smokeless tobacco: Not on file  . Alcohol Use: Yes     occasionally    OB History    Grav Para Term Preterm Abortions TAB SAB Ect Mult Living                 Previous Medications   ACETAMINOPHEN (TYLENOL) 500 MG TABLET    Take 500 mg by mouth every 6 (six) hours as needed. Takes 1.5 to 2 tablets for headaches    BUTALBITAL-ACETAMINOPHEN-CAFFEINE (FIORICET, ESGIC) 50-325-40 MG PER TABLET    Take 1 tablet by mouth 2 (two) times daily as needed. For migraines   CYCLOBENZAPRINE (FLEXERIL) 10 MG TABLET    Take 10 mg by mouth 3 (three) times daily as needed. For muscle spasms   PRESCRIPTION MEDICATION    Patient states she takes a blood pressure medication. Unknown name or strength. No BP medications at [facility] or [address].    SUMATRIPTAN (IMITREX) 50 MG TABLET    Take 50 mg by mouth every 2 (two) hours as needed. For migraines      Allergies as of 09/28/2010 - Review Complete 09/28/2010  Allergen Reaction Noted  . Aspirin Hives 06/07/2010  . Ibuprofen Hives  06/07/2010  . Other Hives 08/11/2010  . Shellfish allergy Hives 06/07/2010      Review of Systems 10 Systems reviewed and are negative for acute change except as noted in the HPI.  Physical Exam  BP 127/87  Pulse 86  Temp(Src) 96.7 F (35.9 C) (Oral)  Resp 18  Ht 5' (1.524 m)  Wt 160 lb (72.576 kg)  BMI 31.25 kg/m2  SpO2 100%  LMP 09/23/2010  Physical Exam  Nursing note and vitals reviewed. Constitutional: She is oriented to person, place, and time. She appears well-developed and well-nourished.  HENT:  Head: Normocephalic and atraumatic.  Right Ear: External ear normal.  Left Ear: External ear normal.  Nose: Nose normal.  Mouth/Throat: Oropharynx is clear and moist. No oropharyngeal exudate.       TMs clear bilaterally  Eyes: Conjunctivae and EOM are normal. Pupils are equal, round, and reactive to light.  Neck: Normal range of motion. Neck supple.  Cardiovascular: Normal rate, regular rhythm, normal heart sounds and intact distal pulses.   Pulmonary/Chest: Effort normal and breath sounds normal. She has no wheezes. She has no rales.  Abdominal: Soft. Bowel sounds are normal. There is no tenderness.  Musculoskeletal: Normal range of motion. She exhibits  no edema and no tenderness.  Neurological: She is alert and oriented to person, place, and time. She has normal reflexes.  Skin: Skin is warm and dry.  Psychiatric: She has a normal mood and affect. Her behavior is normal. Thought content normal.    ED Course  Procedures  OTHER DATA REVIEWED: Nursing notes and vital signs reviewed.  DIAGNOSTIC STUDIES: Oxygen Saturation is 100% on room air, normal by my Interpretation.   MDM:   IMPRESSION: 1. Upper respiratory infection      PLAN: All results reviewed and discussed with pt, questions answered, pt agreeable with plan.    SCRIBE ATTESTATION: History/physical exam/procedure(s) were performed by non-physician practitioner and as supervising physician I  was immediately available for consultation/collaboration. I have reviewed all notes and am in agreement with care and plan.       Hilario Quarry, MD 09/29/10 [phone]

## 2010-09-28 NOTE — ED Notes (Signed)
Pt c/o cough and congestion with chest tightness. Pt states she has generalized body aches. Pt has cough present. nad noted at this time.

## 2010-10-13 ENCOUNTER — Emergency Department (HOSPITAL_COMMUNITY)
Admission: EM | Admit: 2010-10-13 | Discharge: 2010-10-13 | Disposition: A | Discharge status: Home / Self Care | Payer: Medicaid Other | Attending: Emergency Medicine | Admitting: Emergency Medicine

## 2010-10-13 ENCOUNTER — Encounter (HOSPITAL_COMMUNITY): Payer: Self-pay

## 2010-10-13 DIAGNOSIS — G43909 Migraine, unspecified, not intractable, without status migrainosus: Secondary | ICD-10-CM

## 2010-10-13 DIAGNOSIS — I10 Essential (primary) hypertension: Secondary | ICD-10-CM | POA: Insufficient documentation

## 2010-10-13 DIAGNOSIS — F17200 Nicotine dependence, unspecified, uncomplicated: Secondary | ICD-10-CM | POA: Insufficient documentation

## 2010-10-13 DIAGNOSIS — J45909 Unspecified asthma, uncomplicated: Secondary | ICD-10-CM | POA: Insufficient documentation

## 2010-10-13 LAB — URINALYSIS, ROUTINE W REFLEX MICROSCOPIC
Bilirubin Urine: NEGATIVE
Glucose, UA: NEGATIVE
Ketones, ur: NEGATIVE
Protein, ur: NEGATIVE

## 2010-10-13 LAB — CBC
HCT: 37.1
Hemoglobin: 12.9
Platelets: 234
WBC: 6.3

## 2010-10-13 LAB — DIFFERENTIAL
Lymphocytes Relative: 44
Lymphs Abs: 2.8
Monocytes Absolute: 0.6
Monocytes Relative: 9
Neutro Abs: 2.7

## 2010-10-13 LAB — BASIC METABOLIC PANEL
BUN: 7
GFR calc non Af Amer: >60
Potassium: 4.1
Sodium: 140

## 2010-10-13 LAB — WET PREP, GENITAL: Clue Cells Wet Prep HPF POC: NONE SEEN

## 2010-10-13 LAB — URINE MICROSCOPIC-ADD ON

## 2010-10-13 MED ORDER — METOCLOPRAMIDE HCL 5 MG/ML IJ SOLN
10.0000 mg | Freq: Once | INTRAMUSCULAR | Status: AC
  Administered 2010-10-13: 10 mg via INTRAMUSCULAR
  Filled 2010-10-13: qty 2

## 2010-10-13 MED ORDER — MORPHINE SULFATE 4 MG/ML IJ SOLN
4.0000 mg | Freq: Once | INTRAMUSCULAR | Status: AC
  Administered 2010-10-13: 4 mg via INTRAMUSCULAR
  Filled 2010-10-13: qty 1

## 2010-10-13 MED ORDER — DIPHENHYDRAMINE HCL 50 MG/ML IJ SOLN
25.0000 mg | Freq: Once | INTRAMUSCULAR | Status: AC
  Administered 2010-10-13: 25 mg via INTRAMUSCULAR
  Filled 2010-10-13: qty 1

## 2010-10-13 NOTE — ED Notes (Signed)
Pt reports a migraine that began yesterday.  Pt reports n/v, sensitivity to light, neck and facial pain.  Pt also reports inability to eat or drink all day.

## 2010-10-13 NOTE — ED Notes (Signed)
Pt a/ox4. Resp even and unlabored. NAD at this time. Pt ambulated to lobby to wait for mother to transport home.

## 2010-10-13 NOTE — ED Provider Notes (Signed)
History     CSN: [id number] Arrival date & time: 10/13/2010  8:00 PM  Chief Complaint  Patient presents with  . Migraine  . Nausea  . Emesis    (Consider location/radiation/quality/duration/timing/severity/associated sxs/prior treatment) Patient is a 25 y.o. female presenting with migraine and vomiting. The history is provided by the patient.  Migraine  Emesis    patient with migraine that started yesterday. Similar to her prior migraines has tried her medications at home which haven't helped pain is localized to the left side of her head which is where she normally has her headaches no fever or neck pain or stiffness no rashes noted denies any focal neurological deficits. Some photophobia noted has had emesis x2 denies being dizzy when standing.  Past Medical History  Diagnosis Date  . Hypertension   . Migraine   . Asthma     Past Surgical History  Procedure Date  . Tubal ligation     No family history on file.  History  Substance Use Topics  . Smoking status: Current Some Day Smoker  . Smokeless tobacco: Not on file  . Alcohol Use: Yes     occasionally    OB History    Grav Para Term Preterm Abortions TAB SAB Ect Mult Living                  Review of Systems  Gastrointestinal: Positive for vomiting.  All other systems reviewed and are negative.    Allergies  Aspirin; Ibuprofen; Other; and Shellfish allergy  Home Medications   Current Outpatient Rx  Name Route Sig Dispense Refill  . ACETAMINOPHEN 500 MG PO TABS Oral Take 500 mg by mouth every 6 (six) hours as needed. Takes 1.5 to 2 tablets for headaches     . BUTALBITAL-APAP-CAFFEINE 50-325-40 MG PO TABS Oral Take 1 tablet by mouth 2 (two) times daily as needed. For migraines    . CYCLOBENZAPRINE HCL 10 MG PO TABS Oral Take 10 mg by mouth 3 (three) times daily as needed. For muscle spasms    . PRESCRIPTION MEDICATION  Patient states she takes a blood pressure medication. Unknown name or strength. No  BP medications at [facility] or [address].     . SUMATRIPTAN SUCCINATE 50 MG PO TABS Oral Take 50 mg by mouth every 2 (two) hours as needed. For migraines       BP 131/80  Pulse 86  Temp(Src) 98.9 F (37.2 C) (Oral)  Resp 20  Ht 5' (1.524 m)  Wt 160 lb (72.576 kg)  BMI 31.25 kg/m2  SpO2 100%  LMP 09/23/2010  Physical Exam  Nursing note and vitals reviewed. Constitutional: She is oriented to person, place, and time. Vital signs are normal. She appears well-developed and well-nourished.  Non-toxic appearance. No distress.  HENT:  Head: Normocephalic and atraumatic.  Eyes: Conjunctivae and EOM are normal. Pupils are equal, round, and reactive to light.  Neck: Normal range of motion. Neck supple. No tracheal deviation present.  Cardiovascular: Normal rate, regular rhythm and normal heart sounds.  Exam reveals no gallop.   No murmur heard. Pulmonary/Chest: Effort normal and breath sounds normal. No stridor. No respiratory distress. She has no wheezes.  Abdominal: Soft. Normal appearance and bowel sounds are normal. She exhibits no distension. There is no tenderness. There is no rebound.  Musculoskeletal: Normal range of motion. She exhibits no edema and no tenderness.  Neurological: She is alert and oriented to person, place, and time. She has normal strength. No  cranial nerve deficit or sensory deficit. GCS eye subscore is 4. GCS verbal subscore is 5. GCS motor subscore is 6.  Skin: Skin is warm and dry.  Psychiatric: She has a normal mood and affect. Her speech is normal and behavior is normal.    ED Course  Procedures (including critical care time)  Labs Reviewed - No data to display No results found.   No diagnosis found.    MDM   Pt given pain meds and feels better, repeat neuro exam stable        Toy Baker, MD 10/13/10 2109

## 2010-10-14 LAB — URINE MICROSCOPIC-ADD ON

## 2010-10-14 LAB — URINALYSIS, ROUTINE W REFLEX MICROSCOPIC
Nitrite: NEGATIVE
Protein, ur: 30 — AB
Specific Gravity, Urine: 1.039 — ABNORMAL HIGH
Urobilinogen, UA: 1

## 2010-10-18 LAB — URINALYSIS, ROUTINE W REFLEX MICROSCOPIC
Glucose, UA: NEGATIVE
Nitrite: NEGATIVE
pH: 6

## 2010-10-18 LAB — URINE MICROSCOPIC-ADD ON

## 2010-10-18 LAB — PREGNANCY, URINE: Preg Test, Ur: NEGATIVE

## 2010-11-04 ENCOUNTER — Emergency Department (HOSPITAL_COMMUNITY): Payer: Medicaid Other

## 2010-11-04 ENCOUNTER — Emergency Department (HOSPITAL_COMMUNITY)
Admission: EM | Admit: 2010-11-04 | Discharge: 2010-11-04 | Disposition: A | Discharge status: Home / Self Care | Payer: Medicaid Other | Attending: Emergency Medicine | Admitting: Emergency Medicine

## 2010-11-04 ENCOUNTER — Encounter (HOSPITAL_COMMUNITY): Payer: Self-pay | Admitting: Emergency Medicine

## 2010-11-04 DIAGNOSIS — G43909 Migraine, unspecified, not intractable, without status migrainosus: Secondary | ICD-10-CM | POA: Insufficient documentation

## 2010-11-04 DIAGNOSIS — J45909 Unspecified asthma, uncomplicated: Secondary | ICD-10-CM | POA: Insufficient documentation

## 2010-11-04 DIAGNOSIS — F17200 Nicotine dependence, unspecified, uncomplicated: Secondary | ICD-10-CM | POA: Insufficient documentation

## 2010-11-04 DIAGNOSIS — I10 Essential (primary) hypertension: Secondary | ICD-10-CM | POA: Insufficient documentation

## 2010-11-04 MED ORDER — SODIUM CHLORIDE 0.9 % IV SOLN
INTRAVENOUS | Status: AC
  Administered 2010-11-04: 10:00:00 via INTRAVENOUS

## 2010-11-04 MED ORDER — PROMETHAZINE HCL 25 MG/ML IJ SOLN
12.5000 mg | Freq: Once | INTRAMUSCULAR | Status: AC
  Administered 2010-11-04: 12.5 mg via INTRAVENOUS
  Filled 2010-11-04: qty 1

## 2010-11-04 MED ORDER — MORPHINE SULFATE 4 MG/ML IJ SOLN
6.0000 mg | Freq: Once | INTRAMUSCULAR | Status: AC
  Administered 2010-11-04: 6 mg via INTRAVENOUS
  Filled 2010-11-04: qty 2

## 2010-11-04 MED ORDER — ACETAMINOPHEN-CODEINE #3 300-30 MG PO TABS: 1.0000 | ORAL_TABLET | Freq: Four times a day (QID) | ORAL | Status: AC | PRN

## 2010-11-04 MED ORDER — DIPHENHYDRAMINE HCL 50 MG/ML IJ SOLN
25.0000 mg | Freq: Once | INTRAMUSCULAR | Status: AC
  Administered 2010-11-04: 25 mg via INTRAVENOUS
  Filled 2010-11-04: qty 1

## 2010-11-04 MED ORDER — FENTANYL CITRATE 0.05 MG/ML IJ SOLN
50.0000 ug | Freq: Once | INTRAMUSCULAR | Status: AC
  Administered 2010-11-04: 50 ug via INTRAVENOUS
  Filled 2010-11-04: qty 2

## 2010-11-04 MED ORDER — HYDROCODONE-ACETAMINOPHEN 5-325 MG PO TABS
2.0000 | ORAL_TABLET | Freq: Once | ORAL | Status: AC
  Administered 2010-11-04: 2 via ORAL
  Filled 2010-11-04: qty 2

## 2010-11-04 MED ORDER — PROMETHAZINE HCL 25 MG PO TABS: 25.0000 mg | ORAL_TABLET | Freq: Four times a day (QID) | ORAL | Status: DC | PRN

## 2010-11-04 NOTE — ED Notes (Signed)
Pt in ct 

## 2010-11-04 NOTE — ED Notes (Signed)
Pt reports has had headache x 3 weeks.  Also reports vomiting.  Says has a doctor but can't see him until her medicaid card has his name on it.

## 2010-11-04 NOTE — ED Notes (Signed)
Pt requesting something for itching. States pain is 6.5 at this time. Nad. PA aware

## 2010-11-04 NOTE — ED Provider Notes (Addendum)
History     CSN: [id number] Arrival date & time: 11/04/2010  8:43 AM   First MD Initiated Contact with Patient 11/04/10 [phone]      Chief Complaint  Patient presents with  . Headache    (Consider location/radiation/quality/duration/timing/severity/associated sxs/prior treatment) Patient is a 25 y.o. female presenting with headaches. The history is provided by the patient.  Headache  This is a recurrent problem. The current episode started more than 1 week ago. The problem has been gradually worsening. The headache is associated with bright light and loud noise. The pain is located in the left unilateral region. The quality of the pain is described as throbbing. The pain is at a severity of 10/10. The pain is severe. The pain radiates to the face and left neck. Associated symptoms include anorexia, malaise/fatigue and nausea. Pertinent negatives include no chest pressure, no palpitations, no syncope and no shortness of breath. She has tried acetaminophen, triptan therapy and resting in a darkened room for the symptoms.    Past Medical History  Diagnosis Date  . Hypertension   . Migraine   . Asthma   . Renal disorder     Past Surgical History  Procedure Date  . Tubal ligation     History reviewed. No pertinent family history.  History  Substance Use Topics  . Smoking status: Current Some Day Smoker    Types: Cigarettes  . Smokeless tobacco: Not on file  . Alcohol Use: Yes     occasionally    OB History    Grav Para Term Preterm Abortions TAB SAB Ect Mult Living                  Review of Systems  Constitutional: Positive for malaise/fatigue. Negative for activity change.       All ROS Neg except as noted in HPI  HENT: Negative for nosebleeds and neck pain.   Eyes: Negative for photophobia and discharge.  Respiratory: Negative for cough, shortness of breath and wheezing.   Cardiovascular: Negative for chest pain, palpitations and syncope.  Gastrointestinal:  Positive for nausea and anorexia. Negative for abdominal pain and blood in stool.  Genitourinary: Negative for dysuria, frequency and hematuria.  Musculoskeletal: Negative for back pain and arthralgias.  Skin: Negative.   Neurological: Positive for headaches. Negative for dizziness, seizures and speech difficulty.  Psychiatric/Behavioral: Negative for hallucinations and confusion.    Allergies  Aspirin; Ibuprofen; Other; and Shellfish allergy  Home Medications   Current Outpatient Rx  Name Route Sig Dispense Refill  . ACETAMINOPHEN 500 MG PO TABS Oral Take 500-1,000 mg by mouth every 6 (six) hours as needed. Pain    . ATENOLOL 25 MG PO TABS Oral Take 25 mg by mouth daily.      Marland Kitchen BUTALBITAL-APAP-CAFFEINE 50-325-40 MG PO TABS Oral Take 2 tablets by mouth 2 (two) times daily. For migraines    . CYCLOBENZAPRINE HCL 10 MG PO TABS Oral Take 10 mg by mouth 3 (three) times daily as needed. For muscle spasms    . RIZATRIPTAN BENZOATE 10 MG PO TBDP Oral Take 10 mg by mouth as needed. May repeat in 2 hours if needed     . ALBUTEROL SULFATE HFA 108 (90 BASE) MCG/ACT IN AERS Inhalation Inhale 2 puffs into the lungs every 6 (six) hours as needed. Asthma Symptoms     . PRESCRIPTION MEDICATION  Patient states she takes a blood pressure medication. Unknown name or strength. No BP medications at [facility] or [address].     Marland Kitchen  SUMATRIPTAN SUCCINATE 50 MG PO TABS Oral Take 50 mg by mouth every 2 (two) hours as needed. For migraines       BP 135/75  Pulse 73  Temp(Src) 98.2 F (36.8 C) (Oral)  Resp 18  Ht 5' (1.524 m)  Wt 163 lb (73.936 kg)  BMI 31.83 kg/m2  SpO2 100%  LMP 11/01/2010  Physical Exam  Nursing note and vitals reviewed. Constitutional: She is oriented to person, place, and time. She appears well-developed and well-nourished.  Non-toxic appearance.  HENT:  Head: Normocephalic.  Right Ear: Tympanic membrane and external ear normal.  Left Ear: Tympanic membrane and external  ear normal.  Eyes: EOM and lids are normal. Pupils are equal, round, and reactive to light.  Neck: Normal range of motion. Neck supple. Carotid bruit is not present.  Cardiovascular: Normal rate, regular rhythm, normal heart sounds, intact distal pulses and normal pulses.   Pulmonary/Chest: Breath sounds normal. No respiratory distress.  Abdominal: Soft. Bowel sounds are normal. There is no tenderness. There is no guarding.  Musculoskeletal: Normal range of motion.  Lymphadenopathy:       Head (right side): No submandibular adenopathy present.       Head (left side): No submandibular adenopathy present.    She has no cervical adenopathy.  Neurological: She is alert and oriented to person, place, and time. She has normal strength. No cranial nerve deficit or sensory deficit. She exhibits normal muscle tone. Coordination and gait normal. GCS eye subscore is 4. GCS verbal subscore is 5. GCS motor subscore is 6.  Skin: Skin is warm and dry.  Psychiatric: She has a normal mood and affect. Her speech is normal.    ED Course  Procedures (including critical care time)  Labs Reviewed - No data to display No results found.   Dx: Migraine Headache   MDM  I have reviewed nursing notes, vital signs, and all appropriate lab and imaging results for this patient. CT scan negative. No gross neuro deficit. Pain improved with meds. Safe to go home.        Kathie Dike, PA 11/04/10 1241  Kathie Dike, [place] 11/26/10 [phone]

## 2010-11-07 ENCOUNTER — Encounter (HOSPITAL_COMMUNITY): Payer: Self-pay | Admitting: Emergency Medicine

## 2010-11-07 ENCOUNTER — Emergency Department (HOSPITAL_COMMUNITY)
Admission: EM | Admit: 2010-11-07 | Discharge: 2010-11-07 | Disposition: A | Discharge status: Home / Self Care | Payer: Medicaid Other | Attending: Emergency Medicine | Admitting: Emergency Medicine

## 2010-11-07 DIAGNOSIS — G43909 Migraine, unspecified, not intractable, without status migrainosus: Secondary | ICD-10-CM | POA: Insufficient documentation

## 2010-11-07 DIAGNOSIS — I10 Essential (primary) hypertension: Secondary | ICD-10-CM | POA: Insufficient documentation

## 2010-11-07 DIAGNOSIS — J45909 Unspecified asthma, uncomplicated: Secondary | ICD-10-CM | POA: Insufficient documentation

## 2010-11-07 DIAGNOSIS — F17200 Nicotine dependence, unspecified, uncomplicated: Secondary | ICD-10-CM | POA: Insufficient documentation

## 2010-11-07 MED ORDER — OXYCODONE-ACETAMINOPHEN 5-325 MG PO TABS: 1.0000 | ORAL_TABLET | ORAL | Status: AC | PRN

## 2010-11-07 MED ORDER — OXYCODONE-ACETAMINOPHEN 5-325 MG PO TABS
1.0000 | ORAL_TABLET | Freq: Once | ORAL | Status: AC
  Administered 2010-11-07: 1 via ORAL
  Filled 2010-11-07: qty 1

## 2010-11-07 MED ORDER — ONDANSETRON HCL 4 MG/2ML IJ SOLN
4.0000 mg | Freq: Once | INTRAMUSCULAR | Status: AC
  Administered 2010-11-07: 4 mg via INTRAVENOUS
  Filled 2010-11-07: qty 2

## 2010-11-07 MED ORDER — SODIUM CHLORIDE 0.9 % IV BOLUS (SEPSIS)
1000.0000 mL | Freq: Once | INTRAVENOUS | Status: AC
  Administered 2010-11-07: 1000 mL via INTRAVENOUS

## 2010-11-07 MED ORDER — CYCLOBENZAPRINE HCL 5 MG PO TABS: 5.0000 mg | ORAL_TABLET | Freq: Three times a day (TID) | ORAL | Status: AC | PRN

## 2010-11-07 MED ORDER — DROPERIDOL 2.5 MG/ML IJ SOLN: 0.6250 mg | Freq: Once | INTRAMUSCULAR | Status: DC

## 2010-11-07 MED ORDER — DIPHENHYDRAMINE HCL 50 MG/ML IJ SOLN
25.0000 mg | Freq: Once | INTRAMUSCULAR | Status: AC
  Administered 2010-11-07: 25 mg via INTRAVENOUS
  Filled 2010-11-07: qty 1

## 2010-11-07 MED ORDER — CYCLOBENZAPRINE HCL 10 MG PO TABS
10.0000 mg | ORAL_TABLET | Freq: Once | ORAL | Status: AC
Start: 2010-11-07 — End: 2010-11-07
  Administered 2010-11-07: 10 mg via ORAL
  Filled 2010-11-07: qty 1

## 2010-11-07 MED ORDER — HYDROMORPHONE HCL 1 MG/ML IJ SOLN
1.0000 mg | Freq: Once | INTRAMUSCULAR | Status: AC
  Administered 2010-11-07: 1 mg via INTRAVENOUS
  Filled 2010-11-07: qty 1

## 2010-11-07 MED ORDER — DEXAMETHASONE SODIUM PHOSPHATE 10 MG/ML IJ SOLN
10.0000 mg | Freq: Once | INTRAMUSCULAR | Status: AC
  Administered 2010-11-07: 10 mg via INTRAVENOUS
  Filled 2010-11-07: qty 1

## 2010-11-07 NOTE — ED Notes (Signed)
Patient c/o left sided migraine with left sided blurred vision that started yesterday morning and has been progesively getting worse. Patient reports sensitivity to light, sound, and smell with nausea and vomiting. No active vomiting noted at this time. Patient also reports hx of migraines in which she was seen here recently for.

## 2010-11-07 NOTE — ED Notes (Signed)
Pt iv site changed per pt request. IV discontinued from lt ac and restarted in rt acx1 attempt. Iv fluids restarted. 1mg  dilaudid given. Pain 8/10 prior to med administration.

## 2010-11-07 NOTE — ED Notes (Signed)
Patient reports migraine since yesterday morning. Patient reports pain is on the left side of her head that radiates down her left neck. Reports blurred vision in left eye. +nausea.

## 2010-11-07 NOTE — ED Provider Notes (Signed)
Shelda Jakes, MD  Medical screening examination/treatment/procedure(s) were conducted as a shared visit with non-physician practitioner(s) and myself.  I personally evaluated the patient during the encounter  Patient seen by me. Doubt that patient's symptoms are entirely related to migraine headaches. Symptoms seem to be more related to a left cervical muscle spasm. No radiculopathy. Recommend treating patient with pain medicines for the pain and pain control.  Shelda Jakes, MD 11/07/10 [phone]

## 2010-11-09 NOTE — ED Provider Notes (Signed)
Medical screening examination/treatment/procedure(s) were performed by non-physician practitioner and as supervising physician I was immediately available for consultation/collaboration.  Raeford Razor, MD 11/09/10 [phone]

## 2010-11-12 NOTE — ED Provider Notes (Signed)
History     CSN: [id number] Arrival date & time: 11/07/2010 11:08 AM   First MD Initiated Contact with Patient 11/07/10 1119      Chief Complaint  Patient presents with  . Migraine    n/v  . Blurred Vision    (Consider location/radiation/quality/duration/timing/severity/associated sxs/prior treatment) Patient is a 25 y.o. female presenting with migraine. The history is provided by the patient.  Migraine This is a recurrent problem. The current episode started yesterday. The problem occurs constantly. The problem has been gradually worsening. Associated symptoms include anorexia, nausea, neck pain and a visual change. Pertinent negatives include no congestion, fever, myalgias, numbness, sore throat, vomiting or weakness. Associated symptoms comments: She also describes photophobia, phonophobia and sensitivity to smells  along with left sided headache blurred vision which has been associated with previous migraines.  She also reports that her headache pain radiates into her left lateral neck. . Exacerbated by: Bright light worsens her pain.  Head rotation to the right worsens neck pain. She has tried oral narcotics (She has also tried triptans and resting in a darkened room,  which did not relieve her symptoms.) for the symptoms.    Past Medical History  Diagnosis Date  . Hypertension   . Migraine   . Asthma   . Renal disorder     Past Surgical History  Procedure Date  . Tubal ligation     Family History  Problem Relation Age of Onset  . Hypertension Mother   . Diabetes Mother   . Heart failure Father     History  Substance Use Topics  . Smoking status: Current Some Day Smoker    Types: Cigarettes  . Smokeless tobacco: Never Used  . Alcohol Use: Yes     occasionally-once a month    OB History    Grav Para Term Preterm Abortions TAB SAB Ect Mult Living   4 4 4       4       Review of Systems  Constitutional: Negative for fever.  HENT: Positive for neck pain.  Negative for congestion and sore throat.   Gastrointestinal: Positive for nausea and anorexia. Negative for vomiting.  Musculoskeletal: Negative for myalgias.  Neurological: Negative for weakness and numbness.    Allergies  Aspirin; Ibuprofen; Other; and Shellfish allergy  Home Medications   Current Outpatient Rx  Name Route Sig Dispense Refill  . ACETAMINOPHEN 500 MG PO TABS Oral Take 500-1,000 mg by mouth every 6 (six) hours as needed. Pain    . ACETAMINOPHEN-CODEINE #3 300-30 MG PO TABS Oral Take 1-2 tablets by mouth every 6 (six) hours as needed for pain. 20 tablet 0  . ALBUTEROL SULFATE HFA 108 (90 BASE) MCG/ACT IN AERS Inhalation Inhale 2 puffs into the lungs every 6 (six) hours as needed. Asthma Symptoms     . ATENOLOL 25 MG PO TABS Oral Take 25 mg by mouth daily.      . CYCLOBENZAPRINE HCL 10 MG PO TABS Oral Take 10 mg by mouth 3 (three) times daily as needed. For muscle spasms    . PROMETHAZINE HCL 25 MG PO TABS Oral Take 1 tablet (25 mg total) by mouth every 6 (six) hours as needed for nausea. 15 tablet 0  . RIZATRIPTAN BENZOATE 10 MG PO TBDP Oral Take 10 mg by mouth as needed. May repeat in 2 hours if needed     . BUTALBITAL-APAP-CAFFEINE 50-325-40 MG PO TABS Oral Take 2 tablets by mouth 2 (two) times daily. For  migraines    . CYCLOBENZAPRINE HCL 5 MG PO TABS Oral Take 1 tablet (5 mg total) by mouth 3 (three) times daily as needed for muscle spasms. 20 tablet 0  . OXYCODONE-ACETAMINOPHEN 5-325 MG PO TABS Oral Take 1 tablet by mouth every 4 (four) hours as needed for pain. 15 tablet 0  . PRESCRIPTION MEDICATION  Patient states she takes a blood pressure medication. Unknown name or strength. No BP medications at [facility] or [address].     . SUMATRIPTAN SUCCINATE 50 MG PO TABS Oral Take 50 mg by mouth every 2 (two) hours as needed. For migraines       BP 129/82  Pulse 86  Temp(Src) 98.9 F (37.2 C) (Oral)  Resp 20  Ht 5' (1.524 m)  Wt 165 lb (74.844 kg)  BMI 32.22  kg/m2  SpO2 99%  LMP 11/01/2010  Physical Exam  ED Course  Procedures (including critical care time)  Labs Reviewed - No data to display No results found.   1. Migraine     NS I liter given IV,  Decadron 10  Mg ,  Benadryl 25 mg and zofran 4 mg IV given with minimal relief of pain.  Hydromorphone 1 mg with improvement,  But headache still present.  Oxycodone PO,  Flexeril PO given prior to dc home.  Patient was seen by Dr Deretha Emory prior to dc home.   MDM  Migraine        Tanya Krause, [place] 11/12/10 2113

## 2010-11-13 NOTE — ED Provider Notes (Signed)
Medical screening examination/treatment/procedure(s) were performed by non-physician practitioner and as supervising physician I was immediately available for consultation/collaboration.   Shelda Jakes, MD 11/13/10 [phone]

## 2010-11-26 NOTE — ED Provider Notes (Signed)
Medical screening examination/treatment/procedure(s) were performed by non-physician practitioner and as supervising physician I was immediately available for consultation/collaboration.  Raeford Razor, MD 11/26/10 [phone]

## 2010-12-27 ENCOUNTER — Encounter (HOSPITAL_COMMUNITY): Payer: Self-pay

## 2010-12-27 ENCOUNTER — Emergency Department (HOSPITAL_COMMUNITY)
Admission: EM | Admit: 2010-12-27 | Discharge: 2010-12-27 | Disposition: A | Discharge status: Home / Self Care | Payer: Medicaid Other | Attending: Emergency Medicine | Admitting: Emergency Medicine

## 2010-12-27 DIAGNOSIS — I10 Essential (primary) hypertension: Secondary | ICD-10-CM | POA: Insufficient documentation

## 2010-12-27 DIAGNOSIS — R51 Headache: Secondary | ICD-10-CM

## 2010-12-27 DIAGNOSIS — F17200 Nicotine dependence, unspecified, uncomplicated: Secondary | ICD-10-CM | POA: Insufficient documentation

## 2010-12-27 DIAGNOSIS — N289 Disorder of kidney and ureter, unspecified: Secondary | ICD-10-CM | POA: Insufficient documentation

## 2010-12-27 DIAGNOSIS — J45909 Unspecified asthma, uncomplicated: Secondary | ICD-10-CM | POA: Insufficient documentation

## 2010-12-27 DIAGNOSIS — G43909 Migraine, unspecified, not intractable, without status migrainosus: Secondary | ICD-10-CM | POA: Insufficient documentation

## 2010-12-27 MED ORDER — HYDROCODONE-ACETAMINOPHEN 5-325 MG PO TABS: ORAL_TABLET | ORAL | Status: DC

## 2010-12-27 MED ORDER — DIPHENHYDRAMINE HCL 12.5 MG/5ML PO ELIX
12.5000 mg | ORAL_SOLUTION | Freq: Once | ORAL | Status: AC
  Administered 2010-12-27: 12.5 mg via ORAL
  Filled 2010-12-27: qty 5

## 2010-12-27 MED ORDER — PROMETHAZINE HCL 25 MG PO TABS: 25.0000 mg | ORAL_TABLET | Freq: Four times a day (QID) | ORAL | Status: DC | PRN

## 2010-12-27 MED ORDER — HYDROMORPHONE HCL PF 1 MG/ML IJ SOLN
1.0000 mg | Freq: Once | INTRAMUSCULAR | Status: AC
  Administered 2010-12-27: 1 mg via INTRAMUSCULAR

## 2010-12-27 MED ORDER — HYDROMORPHONE HCL PF 1 MG/ML IJ SOLN
1.0000 mg | Freq: Once | INTRAMUSCULAR | Status: DC
  Filled 2010-12-27: qty 1

## 2010-12-27 MED ORDER — PROMETHAZINE HCL 12.5 MG PO TABS
25.0000 mg | ORAL_TABLET | Freq: Once | ORAL | Status: AC
  Administered 2010-12-27: 25 mg via ORAL
  Filled 2010-12-27: qty 2

## 2010-12-27 NOTE — ED Notes (Signed)
Patient denies she is driving

## 2010-12-27 NOTE — ED Provider Notes (Signed)
History     CSN: [id number] Arrival date & time: 12/27/2010  4:39 PM   First MD Initiated Contact with Patient 12/27/10 1725      Chief Complaint  Patient presents with  . Migraine    (Consider location/radiation/quality/duration/timing/severity/associated sxs/prior treatment) Patient is a 25 y.o. female presenting with migraine. The history is provided by the patient.  Migraine This is a recurrent problem. The current episode started in the past 7 days. The problem occurs daily. The problem has been gradually worsening. Associated symptoms include nausea and vomiting. Pertinent negatives include no abdominal pain, arthralgias, chest pain, coughing or neck pain. Associated symptoms comments: Tingling in the left neck and arm.. The symptoms are aggravated by nothing. Treatments tried: Her own medications. The treatment provided no relief.    Past Medical History  Diagnosis Date  . Hypertension   . Migraine   . Asthma   . Renal disorder     Past Surgical History  Procedure Date  . Tubal ligation     Family History  Problem Relation Age of Onset  . Hypertension Mother   . Diabetes Mother   . Heart failure Father     History  Substance Use Topics  . Smoking status: Current Some Day Smoker    Types: Cigarettes  . Smokeless tobacco: Never Used  . Alcohol Use: Yes     occasionally-once a month    OB History    Grav Para Term Preterm Abortions TAB SAB Ect Mult Living   4 4 4       4       Review of Systems  Constitutional: Negative for activity change.       All ROS Neg except as noted in HPI  HENT: Negative for nosebleeds and neck pain.   Eyes: Negative for photophobia and discharge.  Respiratory: Negative for cough, shortness of breath and wheezing.   Cardiovascular: Negative for chest pain and palpitations.  Gastrointestinal: Positive for nausea and vomiting. Negative for abdominal pain and blood in stool.  Genitourinary: Negative for dysuria, frequency and  hematuria.  Musculoskeletal: Negative for back pain and arthralgias.  Skin: Negative.   Neurological: Negative for dizziness, seizures and speech difficulty.  Psychiatric/Behavioral: Negative for hallucinations and confusion.    Allergies  Aspirin; Ibuprofen; Other; and Shellfish allergy  Home Medications   Current Outpatient Rx  Name Route Sig Dispense Refill  . ACETAMINOPHEN 500 MG PO TABS Oral Take 500-1,000 mg by mouth every 6 (six) hours as needed. Pain    . ALBUTEROL SULFATE HFA 108 (90 BASE) MCG/ACT IN AERS Inhalation Inhale 2 puffs into the lungs every 6 (six) hours as needed. Asthma Symptoms     . ATENOLOL 25 MG PO TABS Oral Take 25 mg by mouth daily.      Marland Kitchen BUTALBITAL-APAP-CAFFEINE 50-325-40 MG PO TABS Oral Take 2 tablets by mouth 2 (two) times daily. For migraines      BP 121/92  Pulse 75  Temp(Src) 98.6 F (37 C) (Oral)  Resp 20  Ht 5' (1.524 m)  Wt 145 lb (65.772 kg)  BMI 28.32 kg/m2  SpO2 100%  LMP 12/27/2010  Physical Exam  Nursing note and vitals reviewed. Constitutional: She is oriented to person, place, and time. She appears well-developed and well-nourished.  Non-toxic appearance.  HENT:  Head: Normocephalic.  Right Ear: Tympanic membrane and external ear normal.  Left Ear: Tympanic membrane and external ear normal.  Eyes: EOM and lids are normal. Pupils are equal, round, and reactive  to light.  Neck: Normal range of motion. Neck supple. Carotid bruit is not present.  Cardiovascular: Normal rate, regular rhythm, normal heart sounds, intact distal pulses and normal pulses.   Pulmonary/Chest: Breath sounds normal. No respiratory distress.  Abdominal: Soft. Bowel sounds are normal. There is no tenderness. There is no guarding.  Musculoskeletal: Normal range of motion.  Lymphadenopathy:       Head (right side): No submandibular adenopathy present.       Head (left side): No submandibular adenopathy present.    She has no cervical adenopathy.    Neurological: She is alert and oriented to person, place, and time. She has normal strength. She displays no atrophy and no tremor. No cranial nerve deficit or sensory deficit. She exhibits normal muscle tone. Coordination normal. GCS eye subscore is 4. GCS verbal subscore is 5. GCS motor subscore is 6.  Skin: Skin is warm and dry.  Psychiatric: She has a normal mood and affect. Her speech is normal.    ED Course  Procedures (including critical care time)  Labs Reviewed - No data to display No results found.   Dx: Headaches   MDM  I have reviewed nursing notes, vital signs, and all appropriate lab and imaging results for this patient.        Kathie Dike, [place] 12/27/10 [phone]

## 2010-12-27 NOTE — ED Notes (Signed)
C/o headache , pain in left side of neck and numbness left hand for 3 days, states she had dental work last week and thinks the dentist may have injured a nerve, dental work was on the right side, patient has history of migraines

## 2010-12-27 NOTE — ED Notes (Signed)
C/o migraine for 2 days along with nausea vomiting as well.

## 2010-12-28 NOTE — ED Provider Notes (Signed)
Medical screening examination/treatment/procedure(s) were performed by non-physician practitioner and as supervising physician I was immediately available for consultation/collaboration.   Ruben Pyka W Valorie Mcgrory, MD 12/28/10 0047 

## 2011-02-14 ENCOUNTER — Emergency Department (HOSPITAL_COMMUNITY)
Admission: EM | Admit: 2011-02-14 | Discharge: 2011-02-14 | Disposition: A | Discharge status: Home / Self Care | Payer: Medicaid Other | Attending: Emergency Medicine | Admitting: Emergency Medicine

## 2011-02-14 ENCOUNTER — Encounter (HOSPITAL_COMMUNITY): Payer: Self-pay

## 2011-02-14 ENCOUNTER — Emergency Department (HOSPITAL_COMMUNITY): Payer: Medicaid Other

## 2011-02-14 DIAGNOSIS — Z9851 Tubal ligation status: Secondary | ICD-10-CM | POA: Insufficient documentation

## 2011-02-14 DIAGNOSIS — J45909 Unspecified asthma, uncomplicated: Secondary | ICD-10-CM | POA: Insufficient documentation

## 2011-02-14 DIAGNOSIS — F17200 Nicotine dependence, unspecified, uncomplicated: Secondary | ICD-10-CM | POA: Insufficient documentation

## 2011-02-14 DIAGNOSIS — R112 Nausea with vomiting, unspecified: Secondary | ICD-10-CM | POA: Insufficient documentation

## 2011-02-14 DIAGNOSIS — G43909 Migraine, unspecified, not intractable, without status migrainosus: Secondary | ICD-10-CM | POA: Insufficient documentation

## 2011-02-14 DIAGNOSIS — R109 Unspecified abdominal pain: Secondary | ICD-10-CM | POA: Insufficient documentation

## 2011-02-14 DIAGNOSIS — J329 Chronic sinusitis, unspecified: Secondary | ICD-10-CM | POA: Insufficient documentation

## 2011-02-14 DIAGNOSIS — R42 Dizziness and giddiness: Secondary | ICD-10-CM | POA: Insufficient documentation

## 2011-02-14 DIAGNOSIS — R1031 Right lower quadrant pain: Secondary | ICD-10-CM | POA: Insufficient documentation

## 2011-02-14 DIAGNOSIS — M549 Dorsalgia, unspecified: Secondary | ICD-10-CM | POA: Insufficient documentation

## 2011-02-14 DIAGNOSIS — I10 Essential (primary) hypertension: Secondary | ICD-10-CM | POA: Insufficient documentation

## 2011-02-14 LAB — CBC
HCT: 32.5 % — ABNORMAL LOW (ref 36.0–46.0)
Hemoglobin: 11.5 g/dL — ABNORMAL LOW (ref 12.0–15.0)
MCH: 28.3 pg (ref 26.0–34.0)
MCHC: 35.4 g/dL (ref 30.0–36.0)
RDW: 13.4 % (ref 11.5–15.5)

## 2011-02-14 LAB — URINALYSIS, ROUTINE W REFLEX MICROSCOPIC
Glucose, UA: NEGATIVE mg/dL
Hgb urine dipstick: NEGATIVE
Ketones, ur: 15 mg/dL — AB
Leukocytes, UA: NEGATIVE
Protein, ur: NEGATIVE mg/dL
Urobilinogen, UA: 1 mg/dL (ref 0.0–1.0)

## 2011-02-14 LAB — DIFFERENTIAL
Basophils Absolute: 0 10*3/uL (ref 0.0–0.1)
Basophils Relative: 1 % (ref 0–1)
Eosinophils Absolute: 0.3 10*3/uL (ref 0.0–0.7)
Monocytes Absolute: 0.7 10*3/uL (ref 0.1–1.0)
Monocytes Relative: 8 % (ref 3–12)

## 2011-02-14 LAB — BASIC METABOLIC PANEL
BUN: 5 mg/dL — ABNORMAL LOW (ref 6–23)
Calcium: 8.9 mg/dL (ref 8.4–10.5)
Creatinine, Ser: 0.56 mg/dL (ref 0.50–1.10)
GFR calc Af Amer: >90 mL/min (ref >90)
GFR calc non Af Amer: >90 mL/min (ref >90)

## 2011-02-14 MED ORDER — ONDANSETRON HCL 4 MG/2ML IJ SOLN
4.0000 mg | Freq: Once | INTRAMUSCULAR | Status: AC
  Administered 2011-02-14: 4 mg via INTRAVENOUS
  Filled 2011-02-14: qty 2

## 2011-02-14 MED ORDER — SODIUM CHLORIDE 0.9 % IV SOLN: INTRAVENOUS | Status: DC

## 2011-02-14 MED ORDER — HYDROMORPHONE HCL PF 1 MG/ML IJ SOLN
1.0000 mg | Freq: Once | INTRAMUSCULAR | Status: AC
  Administered 2011-02-14: 1 mg via INTRAVENOUS
  Filled 2011-02-14: qty 1

## 2011-02-14 MED ORDER — AMOXICILLIN-POT CLAVULANATE 875-125 MG PO TABS: 1.0000 | ORAL_TABLET | Freq: Two times a day (BID) | ORAL | Status: AC

## 2011-02-14 MED ORDER — OXYCODONE-ACETAMINOPHEN 5-325 MG PO TABS: 2.0000 | ORAL_TABLET | ORAL | Status: AC | PRN

## 2011-02-14 MED ORDER — IOHEXOL 300 MG/ML  SOLN
100.0000 mL | Freq: Once | INTRAMUSCULAR | Status: AC | PRN
  Administered 2011-02-14: 100 mL via INTRAVENOUS

## 2011-02-14 MED ORDER — ONDANSETRON HCL 8 MG PO TABS: 8.0000 mg | ORAL_TABLET | ORAL | Status: AC | PRN

## 2011-02-14 MED ORDER — MORPHINE SULFATE 4 MG/ML IJ SOLN
4.0000 mg | Freq: Once | INTRAMUSCULAR | Status: AC
  Administered 2011-02-14: 4 mg via INTRAVENOUS
  Filled 2011-02-14: qty 1

## 2011-02-14 MED ORDER — SODIUM CHLORIDE 0.9 % IV BOLUS (SEPSIS)
500.0000 mL | Freq: Once | INTRAVENOUS | Status: AC
  Administered 2011-02-14: 500 mL via INTRAVENOUS

## 2011-02-14 MED ORDER — IOHEXOL 300 MG/ML  SOLN
40.0000 mL | Freq: Once | INTRAMUSCULAR | Status: AC | PRN
  Administered 2011-02-14: 40 mL via ORAL

## 2011-02-14 NOTE — ED Notes (Signed)
See previous notes as pt status remains unchanged.

## 2011-02-14 NOTE — ED Notes (Signed)
Pt up to restroom walked without assistance steady gait noted. Pt states has chills, hot flashes and N/V.

## 2011-02-14 NOTE — ED Provider Notes (Signed)
History   This chart was scribed for Donnetta Hutching, MD by Sofie Rower. The patient was seen in room [facility] and the patient's care was started at 3:14PM.    CSN: [id number]  Arrival date & time 02/14/11  1101   First MD Initiated Contact with Patient 02/14/11 1155      Chief Complaint  Patient presents with  . Back Pain    (Consider location/radiation/quality/duration/timing/severity/associated sxs/prior treatment) HPI  Tanya Krause is a 26 y.o. female who presents to the Emergency Department complaining of moderate, constant back pain onset three days located in the right mid back with associated symptoms of radiating pain to the right buttocks, radiates to the RLQ, nausea, vomiting. Modifying factors include lying down which moderately relieves the pain. Pt reports a light headed dizziness when she feels the pain coming on. Pain is worse when she puts pressure on the right leg. Pt reports normal urination, normal bowel movements. No hematuria. Pt has been pregnant four times.    Past Medical History  Diagnosis Date  . Hypertension   . Migraine   . Asthma   . Renal disorder     Past Surgical History  Procedure Date  . Tubal ligation     Family History  Problem Relation Age of Onset  . Hypertension Mother   . Diabetes Mother   . Heart failure Father     History  Substance Use Topics  . Smoking status: Current Some Day Smoker    Types: Cigarettes  . Smokeless tobacco: Never Used  . Alcohol Use: Yes     occasionally-once a month    OB History    Grav Para Term Preterm Abortions TAB SAB Ect Mult Living   4 4 4       4       Review of Systems  10 Systems reviewed and are negative for acute change except as noted in the HPI.   Allergies  Aspirin; Ibuprofen; Other; Shellfish allergy; and Latex  Home Medications   Current Outpatient Rx  Name Route Sig Dispense Refill  . ATENOLOL 25 MG PO TABS Oral Take 25 mg by mouth daily.      . CYCLOBENZAPRINE HCL  10 MG PO TABS Oral Take 10 mg by mouth 3 (three) times daily as needed. For pain    . HYDROCODONE-ACETAMINOPHEN 5-325 MG PO TABS  1 or 2 po q4h prn headache 15 tablet 0  . PROMETHAZINE HCL 25 MG PO TABS Oral Take 25 mg by mouth every 6 (six) hours as needed. For nausea     . SUMATRIPTAN SUCCINATE 100 MG PO TABS Oral Take 100 mg by mouth every 2 (two) hours as needed. For migraine     . ACETAMINOPHEN 500 MG PO TABS Oral Take 500-1,000 mg by mouth every 6 (six) hours as needed. Pain    . ALBUTEROL SULFATE HFA 108 (90 BASE) MCG/ACT IN AERS Inhalation Inhale 2 puffs into the lungs every 6 (six) hours as needed. Asthma Symptoms       BP 135/93  Pulse 78  Temp(Src) 98.5 F (36.9 C) (Oral)  Resp 20  Ht 5' (1.524 m)  Wt 160 lb (72.576 kg)  BMI 31.25 kg/m2  SpO2 100%  LMP 02/02/2011  Physical Exam  Nursing note and vitals reviewed. Constitutional: She is oriented to person, place, and time. She appears well-developed and well-nourished.  HENT:  Head: Normocephalic and atraumatic.  Nose: Nose normal.  Eyes: Conjunctivae and EOM are normal. Pupils are equal,  round, and reactive to light.  Neck: Normal range of motion. Neck supple.  Abdominal: Soft. Bowel sounds are normal.  Musculoskeletal: Normal range of motion. She exhibits tenderness (Right mid back.). She exhibits no edema.  Neurological: She is alert and oriented to person, place, and time.  Skin: Skin is warm and dry.  Psychiatric: She has a normal mood and affect. Her behavior is normal.    ED Course  Procedures (including critical care time)  DIAGNOSTIC STUDIES: Oxygen Saturation is 100% on room air, normal by my interpretation.    COORDINATION OF CARE:   Results for orders placed during the hospital encounter of 02/14/11  URINALYSIS, ROUTINE W REFLEX MICROSCOPIC      Component Value Range   Color, Urine YELLOW  YELLOW    APPearance CLEAR  CLEAR    Specific Gravity, Urine 1.010  1.005 - 1.030    pH 7.0  5.0 - 8.0     Glucose, UA NEGATIVE  NEGATIVE (mg/dL)   Hgb urine dipstick NEGATIVE  NEGATIVE    Bilirubin Urine NEGATIVE  NEGATIVE    Ketones, ur 15 (*) NEGATIVE (mg/dL)   Protein, ur NEGATIVE  NEGATIVE (mg/dL)   Urobilinogen, UA 1.0  0.0 - 1.0 (mg/dL)   Nitrite NEGATIVE  NEGATIVE    Leukocytes, UA NEGATIVE  NEGATIVE   PREGNANCY, URINE      Component Value Range   Preg Test, Ur NEGATIVE  NEGATIVE   CBC      Component Value Range   WBC 7.8  4.0 - 10.5 (K/uL)   RBC 4.07  3.87 - 5.11 (MIL/uL)   Hemoglobin 11.5 (*) 12.0 - 15.0 (g/dL)   HCT 16.1 (*) 09.6 - 46.0 (%)   MCV 79.9  78.0 - 100.0 (fL)   MCH 28.3  26.0 - 34.0 (pg)   MCHC 35.4  30.0 - 36.0 (g/dL)   RDW 04.5  40.9 - 81.1 (%)   Platelets 223  150 - 400 (K/uL)  DIFFERENTIAL      Component Value Range   Neutrophils Relative 39 (*) 43 - 77 (%)   Neutro Abs 3.0  1.7 - 7.7 (K/uL)   Lymphocytes Relative 48 (*) 12 - 46 (%)   Lymphs Abs 3.8  0.7 - 4.0 (K/uL)   Monocytes Relative 8  3 - 12 (%)   Monocytes Absolute 0.7  0.1 - 1.0 (K/uL)   Eosinophils Relative 4  0 - 5 (%)   Eosinophils Absolute 0.3  0.0 - 0.7 (K/uL)   Basophils Relative 1  0 - 1 (%)   Basophils Absolute 0.0  0.0 - 0.1 (K/uL)  BASIC METABOLIC PANEL      Component Value Range   Sodium 139  135 - 145 (mEq/L)   Potassium 3.3 (*) 3.5 - 5.1 (mEq/L)   Chloride 107  96 - 112 (mEq/L)   CO2 22  19 - 32 (mEq/L)   Glucose, Bld 83  70 - 99 (mg/dL)   BUN 5 (*) 6 - 23 (mg/dL)   Creatinine, Ser 9.14  0.50 - 1.10 (mg/dL)   Calcium 8.9  8.4 - 78.2 (mg/dL)   GFR calc non Af Amer >90  >90 (mL/min)   GFR calc Af Amer >90  >90 (mL/min)   Ct Abdomen Pelvis W Contrast  02/14/2011  *RADIOLOGY REPORT*  Clinical Data: Back pain.  Right flank pain.  Right lower quadrant pain for 3 days.  Nausea and vomiting.  CT ABDOMEN AND PELVIS WITH CONTRAST  Technique:  Multidetector CT  imaging of the abdomen and pelvis was performed following the standard protocol during bolus administration of intravenous  contrast.  Contrast: OMNIPAQUE IOHEXOL 300 MG/ML IV SOLN, 40mL OMNIPAQUE IOHEXOL 300 MG/ML IV SOLN  Comparison: 06/29/2010.  Findings: Lung bases clear.  Ill-defined faint low density lesion is present in the posterior right hepatic lobe.  This appears little changed compared to multiple prior exams, compatible with a benign etiology.  Focal fatty infiltration adjacent the falciform ligament of the liver.  Gallbladder appears normal.  No calcified gallstones.  Pancreas normal.  Spleen normal.  Normal renal enhancement.  No ureteral stones are identified.  Abdominal vasculature appears normal.  Stomach and proximal small bowel is normal.  Normal appendix identified in the right lower quadrant.  Physiologic appearance of the uterus and adnexa.  Tiny amount of free fluid in the anatomic pelvis is physiologic.  Urinary bladder is normal.  The colon is decompressed distally. Proximal colon is normal without acute abnormality.  IMPRESSION: 1.  No acute abnormality. 2.  Stable appearance of the abdomen and pelvis compared to prior.  Original Report Authenticated By: Andreas Newport, M.D.          3:18PM- EDP at bedside discusses treatment plan. 5:21PM- Recheck. EDP at bedside discusses treatment plan.   MDM  CT scan shows no acute anomalies. No acute abdomen. Also complains of left-sided maxillary sinus infection. White count normal. Afebrile. We'll discharge him with pain medicine, nausea medicine, Augmentin for sinus infection     I personally performed the services described in this documentation, which was scribed in my presence. The recorded information has been reviewed and considered.     Donnetta Hutching, MD 02/14/11 2226

## 2011-02-14 NOTE — ED Notes (Signed)
Pt reports since Friday has had pain in mid to r lower back radiating around to RLQ.  Also reports dizziness, nausea, and vomiting.  Denies history of kidney stones but says has frequent kidney infection.  Denies urinary symptoms.  Denies any vaginal bleeding or discharge.  LMP was  A week ago Wednesday and was normal per pt.

## 2011-02-14 NOTE — ED Notes (Signed)
Pt vomiting after c/o feeling "queezy". Dr Adriana Simas notified. Verbal order obtained.

## 2011-02-14 NOTE — ED Notes (Signed)
EDP notified of pt not being seen at this time.

## 2011-02-14 NOTE — ED Notes (Signed)
Called to pt's room, Pt vomiting large amount of CT contrast. Radiology notified. Dr Adriana Simas notified.

## 2011-02-14 NOTE — ED Notes (Signed)
Pt states feels better. Saltine crackers and soda tolerated well.

## 2011-04-25 ENCOUNTER — Emergency Department (HOSPITAL_COMMUNITY): Payer: Medicaid Other

## 2011-04-25 ENCOUNTER — Emergency Department (HOSPITAL_COMMUNITY)
Admission: EM | Admit: 2011-04-25 | Discharge: 2011-04-25 | Disposition: A | Discharge status: Home / Self Care | Payer: Medicaid Other | Attending: Emergency Medicine | Admitting: Emergency Medicine

## 2011-04-25 ENCOUNTER — Encounter (HOSPITAL_COMMUNITY): Payer: Self-pay | Admitting: *Deleted

## 2011-04-25 DIAGNOSIS — I10 Essential (primary) hypertension: Secondary | ICD-10-CM | POA: Insufficient documentation

## 2011-04-25 DIAGNOSIS — J45909 Unspecified asthma, uncomplicated: Secondary | ICD-10-CM | POA: Insufficient documentation

## 2011-04-25 DIAGNOSIS — S93409A Sprain of unspecified ligament of unspecified ankle, initial encounter: Secondary | ICD-10-CM

## 2011-04-25 DIAGNOSIS — Y92009 Unspecified place in unspecified non-institutional (private) residence as the place of occurrence of the external cause: Secondary | ICD-10-CM | POA: Insufficient documentation

## 2011-04-25 DIAGNOSIS — Z79899 Other long term (current) drug therapy: Secondary | ICD-10-CM | POA: Insufficient documentation

## 2011-04-25 DIAGNOSIS — W19XXXA Unspecified fall, initial encounter: Secondary | ICD-10-CM | POA: Insufficient documentation

## 2011-04-25 MED ORDER — HYDROCODONE-ACETAMINOPHEN 5-325 MG PO TABS
1.0000 | ORAL_TABLET | Freq: Once | ORAL | Status: AC
  Administered 2011-04-25: 1 via ORAL
  Filled 2011-04-25: qty 1

## 2011-04-25 MED ORDER — PREDNISONE 20 MG PO TABS
60.0000 mg | ORAL_TABLET | Freq: Once | ORAL | Status: AC
  Administered 2011-04-25: 60 mg via ORAL
  Filled 2011-04-25: qty 3

## 2011-04-25 MED ORDER — HYDROCODONE-ACETAMINOPHEN 5-325 MG PO TABS: 1.0000 | ORAL_TABLET | ORAL | Status: AC | PRN

## 2011-04-25 MED ORDER — PREDNISONE 10 MG PO TABS: ORAL_TABLET | ORAL | Status: DC

## 2011-04-25 NOTE — ED Notes (Signed)
Tripped and fell last pm. Pain lt ankle and foot.

## 2011-04-25 NOTE — Discharge Instructions (Signed)
Ankle Sprain An ankle sprain is an injury to the strong, fibrous tissues (ligaments) that hold the bones of your ankle joint together.  CAUSES Ankle sprain usually is caused by a fall or by twisting your ankle. People who participate in sports are more prone to these types of injuries.  SYMPTOMS  Symptoms of ankle sprain include:  Pain in your ankle. The pain may be present at rest or only when you are trying to stand or walk.   Swelling.   Bruising. Bruising may develop immediately or within 1 to 2 days after your injury.   Difficulty standing or walking.  DIAGNOSIS  Your caregiver will ask you details about your injury and perform a physical exam of your ankle to determine if you have an ankle sprain. During the physical exam, your caregiver will press and squeeze specific areas of your foot and ankle. Your caregiver will try to move your ankle in certain ways. An X-ray exam may be done to be sure a bone was not broken or a ligament did not separate from one of the bones in your ankle (avulsion).  TREATMENT  Certain types of braces can help stabilize your ankle. Your caregiver can make a recommendation for this. Your caregiver may recommend the use of medication for pain. If your sprain is severe, your caregiver may refer you to a surgeon who helps to restore function to parts of your skeletal system (orthopedist) or a physical therapist. HOME CARE INSTRUCTIONS  Apply ice to your injury for 1 to 2 days or as directed by your caregiver. Applying ice helps to reduce inflammation and pain.  Put ice in a plastic bag.   Place a towel between your skin and the bag.   Leave the ice on for 15 to 20 minutes at a time, every 2 hours while you are awake.   Take over-the-counter or prescription medicines for pain, discomfort, or fever only as directed by your caregiver.   Keep your injured leg elevated, when possible, to lessen swelling.   If your caregiver recommends crutches, use them as  instructed. Gradually, put weight on the affected ankle. Continue to use crutches or a cane until you can walk without feeling pain in your ankle.   If you have a plaster splint, wear the splint as directed by your caregiver. Do not rest it on anything harder than a pillow the first 24 hours. Do not put weight on it. Do not get it wet. You may take it off to take a shower or bath.   You may have been given an elastic bandage to wear around your ankle to provide support. If the elastic bandage is too tight (you have numbness or tingling in your foot or your foot becomes cold and blue), adjust the bandage to make it comfortable.   If you have an air splint, you may blow more air into it or let air out to make it more comfortable. You may take your splint off at night and before taking a shower or bath.   Wiggle your toes in the splint several times per day if you are able.  SEEK MEDICAL CARE IF:   You have an increase in bruising, swelling, or pain.   Your toes feel cold.   Pain relief is not achieved with medication.  SEEK IMMEDIATE MEDICAL CARE IF: Your toes are numb or blue or you have severe pain. MAKE SURE YOU:   Understand these instructions.   Will watch your condition.     Will get help right away if you are not doing well or get worse.  Document Released: 01/03/2005 Document Revised: 12/23/2010 Document Reviewed: 08/08/2007 [facility] Patient Information 2012 [place], [place].   You may take the hydrocodone prescribed for pain relief.  This will make you drowsy - do not drive within 4 hours of taking this medication.

## 2011-04-28 NOTE — ED Provider Notes (Signed)
History     CSN: [id number]  Arrival date & time 04/25/11  2012   First MD Initiated Contact with Patient 04/25/11 2155      Chief Complaint  Patient presents with  . Ankle Pain    (Consider location/radiation/quality/duration/timing/severity/associated sxs/prior treatment) Patient is a 26 y.o. female presenting with ankle pain. The history is provided by the patient.  Ankle Pain  The incident occurred yesterday. The incident occurred at home. The injury mechanism was a fall (Patient tripped,  twisting her left ankle.). The pain is present in the left ankle. The quality of the pain is described as aching and throbbing. The pain is at a severity of 10/10. The pain is severe. The pain has been constant since onset. Associated symptoms include inability to bear weight and loss of motion. Pertinent negatives include no numbness, no loss of sensation and no tingling. She has tried acetaminophen and rest for the symptoms. The treatment provided no relief.    Past Medical History  Diagnosis Date  . Hypertension   . Migraine   . Asthma   . Renal disorder     Past Surgical History  Procedure Date  . Tubal ligation     Family History  Problem Relation Age of Onset  . Hypertension Mother   . Diabetes Mother   . Heart failure Father     History  Substance Use Topics  . Smoking status: Current Some Day Smoker    Types: Cigarettes  . Smokeless tobacco: Never Used  . Alcohol Use: Yes     occasionally-once a month    OB History    Grav Para Term Preterm Abortions TAB SAB Ect Mult Living   4 4 4       4       Review of Systems  Constitutional: Negative for fever.  HENT: Negative for congestion, sore throat and neck pain.   Eyes: Negative.   Respiratory: Negative for chest tightness and shortness of breath.   Cardiovascular: Negative for chest pain.  Gastrointestinal: Negative for nausea and abdominal pain.  Genitourinary: Negative.   Musculoskeletal: Positive for joint  swelling and arthralgias.  Skin: Negative.  Negative for rash and wound.  Neurological: Negative for dizziness, tingling, weakness, light-headedness, numbness and headaches.  Hematological: Negative.   Psychiatric/Behavioral: Negative.     Allergies  Aspirin; Ibuprofen; Other; Shellfish allergy; and Latex  Home Medications   Current Outpatient Rx  Name Route Sig Dispense Refill  . ACETAMINOPHEN 500 MG PO TABS Oral Take 500-1,000 mg by mouth every 6 (six) hours as needed. Pain    . ALBUTEROL SULFATE HFA 108 (90 BASE) MCG/ACT IN AERS Inhalation Inhale 2 puffs into the lungs every 6 (six) hours as needed. Asthma Symptoms     . ATENOLOL 25 MG PO TABS Oral Take 25 mg by mouth at bedtime.     Marland Kitchen BUTALBITAL-APAP-CAFFEINE 50-325-40 MG PO TABS Oral Take 1 tablet by mouth 2 (two) times daily as needed. FOR MIGRAINE PAIN    . CYCLOBENZAPRINE HCL 10 MG PO TABS Oral Take 10 mg by mouth 3 (three) times daily as needed. For pain    . PROMETHAZINE HCL 25 MG PO TABS Oral Take 25 mg by mouth every 6 (six) hours as needed. For nausea     . SUMATRIPTAN SUCCINATE 100 MG PO TABS Oral Take 100 mg by mouth every 2 (two) hours as needed. For migraine     . HYDROCODONE-ACETAMINOPHEN 5-325 MG PO TABS Oral Take 1 tablet by  mouth every 4 (four) hours as needed for pain. 15 tablet 0  . PREDNISONE 10 MG PO TABS  6, 5, 4, 3, 2 then 1 tablet by mouth daily for 6 days total. 21 tablet 0  . PROMETHAZINE HCL 25 MG PO TABS Oral Take 1 tablet (25 mg total) by mouth every 6 (six) hours as needed for nausea. 15 tablet 0  . PROMETHAZINE HCL 25 MG PO TABS Oral Take 1 tablet (25 mg total) by mouth every 6 (six) hours as needed for nausea. 12 tablet 0    BP 117/86  Pulse 76  Temp(Src) 97.6 F (36.4 C) (Oral)  Resp 20  Ht 5' (1.524 m)  Wt 160 lb (72.576 kg)  BMI 31.25 kg/m2  SpO2 98%  LMP 04/21/2011  Physical Exam  Nursing note and vitals reviewed. Constitutional: She is oriented to person, place, and time. She appears  well-developed and well-nourished.  HENT:  Head: Normocephalic.  Eyes: Conjunctivae are normal.  Neck: Normal range of motion.  Cardiovascular: Normal rate and intact distal pulses.  Exam reveals no decreased pulses.   Pulses:      Dorsalis pedis pulses are 2+ on the right side, and 2+ on the left side.       Posterior tibial pulses are 2+ on the right side, and 2+ on the left side.  Pulmonary/Chest: Effort normal.  Musculoskeletal: She exhibits edema and tenderness.       Left ankle: She exhibits swelling and ecchymosis. She exhibits no deformity and normal pulse. tenderness. Lateral malleolus tenderness found. No head of 5th metatarsal and no proximal fibula tenderness found. Achilles tendon normal.  Neurological: She is alert and oriented to person, place, and time. No sensory deficit.  Skin: Skin is warm, dry and intact.    ED Course  Procedures (including critical care time)  Labs Reviewed - No data to display No results found.   1. Ankle sprain       MDM  ASO, crutches,  RICE, prednisone, hydrocodone.  Referral to Dr. Hilda Lias prn .        Candis Musa, PA 04/28/11 1050

## 2011-04-29 NOTE — ED Provider Notes (Signed)
Medical screening examination/treatment/procedure(s) were performed by non-physician practitioner and as supervising physician I was immediately available for consultation/collaboration. Jaylin Benzel, MD, FACEP   Antonino Nienhuis L Delvis Kau, MD 04/29/11 1724 

## 2011-05-09 ENCOUNTER — Emergency Department (HOSPITAL_COMMUNITY)
Admission: EM | Admit: 2011-05-09 | Discharge: 2011-05-09 | Disposition: A | Discharge status: Home / Self Care | Payer: Medicaid Other | Attending: Emergency Medicine | Admitting: Emergency Medicine

## 2011-05-09 ENCOUNTER — Encounter (HOSPITAL_COMMUNITY): Payer: Self-pay | Admitting: *Deleted

## 2011-05-09 DIAGNOSIS — N760 Acute vaginitis: Secondary | ICD-10-CM | POA: Insufficient documentation

## 2011-05-09 DIAGNOSIS — R10819 Abdominal tenderness, unspecified site: Secondary | ICD-10-CM | POA: Insufficient documentation

## 2011-05-09 DIAGNOSIS — F17200 Nicotine dependence, unspecified, uncomplicated: Secondary | ICD-10-CM | POA: Insufficient documentation

## 2011-05-09 DIAGNOSIS — R112 Nausea with vomiting, unspecified: Secondary | ICD-10-CM | POA: Insufficient documentation

## 2011-05-09 DIAGNOSIS — B9689 Other specified bacterial agents as the cause of diseases classified elsewhere: Secondary | ICD-10-CM | POA: Insufficient documentation

## 2011-05-09 DIAGNOSIS — M549 Dorsalgia, unspecified: Secondary | ICD-10-CM | POA: Insufficient documentation

## 2011-05-09 DIAGNOSIS — A499 Bacterial infection, unspecified: Secondary | ICD-10-CM | POA: Insufficient documentation

## 2011-05-09 DIAGNOSIS — N949 Unspecified condition associated with female genital organs and menstrual cycle: Secondary | ICD-10-CM | POA: Insufficient documentation

## 2011-05-09 DIAGNOSIS — I10 Essential (primary) hypertension: Secondary | ICD-10-CM | POA: Insufficient documentation

## 2011-05-09 DIAGNOSIS — Z79899 Other long term (current) drug therapy: Secondary | ICD-10-CM | POA: Insufficient documentation

## 2011-05-09 DIAGNOSIS — R102 Pelvic and perineal pain: Secondary | ICD-10-CM

## 2011-05-09 DIAGNOSIS — J45909 Unspecified asthma, uncomplicated: Secondary | ICD-10-CM | POA: Insufficient documentation

## 2011-05-09 LAB — URINALYSIS, ROUTINE W REFLEX MICROSCOPIC
Bilirubin Urine: NEGATIVE
Ketones, ur: 15 mg/dL — AB
Nitrite: NEGATIVE
Protein, ur: NEGATIVE mg/dL
Urobilinogen, UA: 0.2 mg/dL (ref 0.0–1.0)

## 2011-05-09 LAB — BASIC METABOLIC PANEL
BUN: 4 mg/dL — ABNORMAL LOW (ref 6–23)
CO2: 24 mEq/L (ref 19–32)
Chloride: 104 mEq/L (ref 96–112)
Creatinine, Ser: 0.52 mg/dL (ref 0.50–1.10)
Glucose, Bld: 87 mg/dL (ref 70–99)
Potassium: 3.1 mEq/L — ABNORMAL LOW (ref 3.5–5.1)

## 2011-05-09 LAB — CBC
HCT: 33.7 % — ABNORMAL LOW (ref 36.0–46.0)
Hemoglobin: 11.7 g/dL — ABNORMAL LOW (ref 12.0–15.0)
MCHC: 34.7 g/dL (ref 30.0–36.0)
RBC: 4.2 MIL/uL (ref 3.87–5.11)

## 2011-05-09 LAB — WET PREP, GENITAL
Trich, Wet Prep: NONE SEEN
Yeast Wet Prep HPF POC: NONE SEEN

## 2011-05-09 LAB — DIFFERENTIAL
Basophils Relative: 0 % (ref 0–1)
Lymphocytes Relative: 43 % (ref 12–46)
Lymphs Abs: 3.9 10*3/uL (ref 0.7–4.0)
Monocytes Absolute: 0.8 10*3/uL (ref 0.1–1.0)
Monocytes Relative: 9 % (ref 3–12)
Neutro Abs: 4.2 10*3/uL (ref 1.7–7.7)

## 2011-05-09 MED ORDER — OXYCODONE-ACETAMINOPHEN 5-325 MG PO TABS: 1.0000 | ORAL_TABLET | Freq: Four times a day (QID) | ORAL | Status: AC | PRN

## 2011-05-09 MED ORDER — METRONIDAZOLE 0.75 % VA GEL: 1.0000 | Freq: Two times a day (BID) | VAGINAL | Status: AC

## 2011-05-09 MED ORDER — HYDROMORPHONE HCL PF 1 MG/ML IJ SOLN
1.0000 mg | Freq: Once | INTRAMUSCULAR | Status: AC
  Administered 2011-05-09: 1 mg via INTRAVENOUS
  Filled 2011-05-09: qty 1

## 2011-05-09 MED ORDER — SODIUM CHLORIDE 0.9 % IV BOLUS (SEPSIS)
1000.0000 mL | Freq: Once | INTRAVENOUS | Status: AC
  Administered 2011-05-09: 1000 mL via INTRAVENOUS

## 2011-05-09 MED ORDER — ONDANSETRON 8 MG PO TBDP
8.0000 mg | ORAL_TABLET | Freq: Once | ORAL | Status: AC
  Administered 2011-05-09: 8 mg via ORAL
  Filled 2011-05-09: qty 1

## 2011-05-09 MED ORDER — HYDROMORPHONE HCL PF 1 MG/ML IJ SOLN
1.0000 mg | Freq: Once | INTRAMUSCULAR | Status: AC
  Administered 2011-05-09: 1 mg via INTRAMUSCULAR
  Filled 2011-05-09: qty 1

## 2011-05-09 MED ORDER — PROMETHAZINE HCL 25 MG PO TABS: 25.0000 mg | ORAL_TABLET | Freq: Four times a day (QID) | ORAL | Status: DC | PRN

## 2011-05-09 MED ORDER — PROMETHAZINE HCL 25 MG/ML IJ SOLN
25.0000 mg | Freq: Once | INTRAMUSCULAR | Status: AC
  Administered 2011-05-09: 25 mg via INTRAVENOUS
  Filled 2011-05-09: qty 1

## 2011-05-09 MED ORDER — OXYCODONE-ACETAMINOPHEN 5-325 MG PO TABS
1.0000 | ORAL_TABLET | Freq: Once | ORAL | Status: AC
  Administered 2011-05-09: 1 via ORAL
  Filled 2011-05-09: qty 1

## 2011-05-09 NOTE — ED Notes (Signed)
Pelvic pain since yesterday, today with pelvic and back pain, denies any burning on urination, denies vaginal odor or discharge, LMP 04/21/11 but past tues or WEdnesday with spotting, + N/V x 2, denies any hx of kidney stones

## 2011-05-09 NOTE — ED Provider Notes (Signed)
History     CSN: [id number]  Arrival date & time 05/09/11  1512   First MD Initiated Contact with Patient 05/09/11 1802      Chief Complaint  Patient presents with  . Pelvic Pain    (Consider location/radiation/quality/duration/timing/severity/associated sxs/prior treatment) Patient is a 26 y.o. female presenting with pelvic pain. The history is provided by the patient.  Pelvic Pain This is a new problem. Associated symptoms include abdominal pain. Pertinent negatives include no chest pain, no headaches and no shortness of breath.   patient has had lower abdominal pelvic pain it goes to her back. She states she's had vomiting without diarrhea. No dysuria. She has had some vaginal bleeding. Her last period was the beginning of this month. She denies possibility of pregnancy. She is normally very regular and does not usually have breakthrough bleeding. She states she sometimes feels like this when she returns to have a urinary tract infection. She's not had any urinary symptoms. She states she thinks the nausea this could be due to the pain. She's had no clear sick contacts.  Past Medical History  Diagnosis Date  . Hypertension   . Migraine   . Asthma   . Renal disorder     Past Surgical History  Procedure Date  . Tubal ligation     Family History  Problem Relation Age of Onset  . Hypertension Mother   . Diabetes Mother   . Heart failure Father     History  Substance Use Topics  . Smoking status: Current Some Day Smoker    Types: Cigarettes  . Smokeless tobacco: Never Used  . Alcohol Use: Yes     occasionally-once a month    OB History    Grav Para Term Preterm Abortions TAB SAB Ect Mult Living   4 4 4       4       Review of Systems  Constitutional: Negative for fever, chills, activity change and appetite change.  HENT: Negative for neck stiffness.   Eyes: Negative for pain.  Respiratory: Negative for chest tightness and shortness of breath.   Cardiovascular:  Negative for chest pain and leg swelling.  Gastrointestinal: Positive for nausea, vomiting and abdominal pain. Negative for diarrhea.  Genitourinary: Positive for pelvic pain. Negative for flank pain.  Musculoskeletal: Positive for back pain.  Skin: Negative for rash.  Neurological: Negative for weakness, numbness and headaches.  Psychiatric/Behavioral: Negative for behavioral problems.    Allergies  Aspirin; Ibuprofen; Other; Shellfish allergy; and Latex  Home Medications   Current Outpatient Rx  Name Route Sig Dispense Refill  . ACETAMINOPHEN 500 MG PO TABS Oral Take 500-1,000 mg by mouth every 6 (six) hours as needed. Pain    . ATENOLOL 25 MG PO TABS Oral Take 25 mg by mouth daily.     Marland Kitchen BUTALBITAL-APAP-CAFFEINE 50-325-40 MG PO TABS Oral Take 1 tablet by mouth 2 (two) times daily as needed. FOR MIGRAINE PAIN    . PROMETHAZINE HCL 25 MG PO TABS Oral Take 25 mg by mouth every 6 (six) hours as needed. For nausea     . ALBUTEROL SULFATE HFA 108 (90 BASE) MCG/ACT IN AERS Inhalation Inhale 2 puffs into the lungs every 6 (six) hours as needed. Asthma Symptoms     . METRONIDAZOLE 0.75 % VA GEL Vaginal Place 1 Applicatorful vaginally 2 (two) times daily. 70 g 0  . OXYCODONE-ACETAMINOPHEN 5-325 MG PO TABS Oral Take 1-2 tablets by mouth every 6 (six) hours as needed  for pain. 10 tablet 0  . PROMETHAZINE HCL 25 MG PO TABS Oral Take 1 tablet (25 mg total) by mouth every 6 (six) hours as needed for nausea. 15 tablet 0  . PROMETHAZINE HCL 25 MG PO TABS Oral Take 1 tablet (25 mg total) by mouth every 6 (six) hours as needed for nausea. 12 tablet 0  . SUMATRIPTAN SUCCINATE 100 MG PO TABS Oral Take 100 mg by mouth every 2 (two) hours as needed. For migraine       BP 145/103  Pulse 74  Temp(Src) 98.4 F (36.9 C) (Oral)  Resp 20  Ht 5' (1.524 m)  Wt 160 lb (72.576 kg)  BMI 31.25 kg/m2  SpO2 100%  LMP 04/21/2011  Physical Exam  Nursing note and vitals reviewed. Constitutional: She is oriented  to person, place, and time. She appears well-developed and well-nourished.  HENT:  Head: Normocephalic and atraumatic.  Eyes: EOM are normal. Pupils are equal, round, and reactive to light.  Neck: Normal range of motion. Neck supple.  Cardiovascular: Normal rate, regular rhythm and normal heart sounds.   No murmur heard. Pulmonary/Chest: Effort normal and breath sounds normal. No respiratory distress. She has no wheezes. She has no rales.  Abdominal: Soft. Bowel sounds are normal. She exhibits no distension. There is tenderness. There is no rebound and no guarding.       Mild suprapubic tenderness without rebound or guarding.  Musculoskeletal: Normal range of motion.  Neurological: She is alert and oriented to person, place, and time. No cranial nerve deficit.  Skin: Skin is warm and dry.  Psychiatric: She has a normal mood and affect. Her speech is normal.   pelvic exam showed minimal vaginal discharge. Cervical os is closed. No cervical motion tenderness. No adnexal tenderness.  ED Course  Procedures (including critical care time)  Labs Reviewed  URINALYSIS, ROUTINE W REFLEX MICROSCOPIC - Abnormal; Notable for the following:    Ketones, ur 15 (*)    All other components within normal limits  WET PREP, GENITAL - Abnormal; Notable for the following:    Clue Cells Wet Prep HPF POC FEW (*)    WBC, Wet Prep HPF POC MODERATE (*)    All other components within normal limits  CBC - Abnormal; Notable for the following:    Hemoglobin 11.7 (*)    HCT 33.7 (*)    All other components within normal limits  BASIC METABOLIC PANEL - Abnormal; Notable for the following:    Potassium 3.1 (*)    BUN 4 (*)    All other components within normal limits  PREGNANCY, URINE  DIFFERENTIAL  GC/CHLAMYDIA PROBE AMP, GENITAL   No results found.   1. Pelvic pain   2. BV (bacterial vaginosis)       MDM  Patient abdominal pelvic and back pain. Lab work is overall reassuring. There are some clue  cells on pelvic exam. Patient is a mild hypokalemia will need to follow. Patient will be discharged home to followup as needed        Juliet Rude. Rubin Payor, MD 05/09/11 2126

## 2011-05-09 NOTE — Discharge Instructions (Signed)
Bacterial Vaginosis Bacterial vaginosis (BV) is a vaginal infection where the normal balance of bacteria in the vagina is disrupted. The normal balance is then replaced by an overgrowth of certain bacteria. There are several different kinds of bacteria that can cause BV. BV is the most common vaginal infection in women of childbearing age. CAUSES   The cause of BV is not fully understood. BV develops when there is an increase or imbalance of harmful bacteria.   Some activities or behaviors can upset the normal balance of bacteria in the vagina and put women at increased risk including:   Having a new sex partner or multiple sex partners.   Douching.   Using an intrauterine device (IUD) for contraception.   It is not clear what role sexual activity plays in the development of BV. However, women that have never had sexual intercourse are rarely infected with BV.  Women do not get BV from toilet seats, bedding, swimming pools or from touching objects around them.  SYMPTOMS   Grey vaginal discharge.   A fish-like odor with discharge, especially after sexual intercourse.   Itching or burning of the vagina and vulva.   Burning or pain with urination.   Some women have no signs or symptoms at all.  DIAGNOSIS  Your caregiver must examine the vagina for signs of BV. Your caregiver will perform lab tests and look at the sample of vaginal fluid through a microscope. They will look for bacteria and abnormal cells (clue cells), a pH test higher than 4.5, and a positive amine test all associated with BV.  RISKS AND COMPLICATIONS   Pelvic inflammatory disease (PID).   Infections following gynecology surgery.   Developing HIV.   Developing herpes virus.  TREATMENT  Sometimes BV will clear up without treatment. However, all women with symptoms of BV should be treated to avoid complications, especially if gynecology surgery is planned. Female partners generally do not need to be treated. However,  BV may spread between female sex partners so treatment is helpful in preventing a recurrence of BV.   BV may be treated with antibiotics. The antibiotics come in either pill or vaginal cream forms. Either can be used with nonpregnant or pregnant women, but the recommended dosages differ. These antibiotics are not harmful to the baby.   BV can recur after treatment. If this happens, a second round of antibiotics will often be prescribed.   Treatment is important for pregnant women. If not treated, BV can cause a premature delivery, especially for a pregnant woman who had a premature birth in the past. All pregnant women who have symptoms of BV should be checked and treated.   For chronic reoccurrence of BV, treatment with a type of prescribed gel vaginally twice a week is helpful.  HOME CARE INSTRUCTIONS   Finish all medication as directed by your caregiver.   Do not have sex until treatment is completed.   Tell your sexual partner that you have a vaginal infection. They should see their caregiver and be treated if they have problems, such as a mild rash or itching.   Practice safe sex. Use condoms. Only have 1 sex partner.  PREVENTION  Basic prevention steps can help reduce the risk of upsetting the natural balance of bacteria in the vagina and developing BV:  Do not have sexual intercourse (be abstinent).   Do not douche.   Use all of the medicine prescribed for treatment of BV, even if the signs and symptoms go away.     Tell your sex partner if you have BV. That way, they can be treated, if needed, to prevent reoccurrence.  SEEK MEDICAL CARE IF:   Your symptoms are not improving after 3 days of treatment.   You have increased discharge, pain, or fever.  MAKE SURE YOU:   Understand these instructions.   Will watch your condition.   Will get help right away if you are not doing well or get worse.  FOR MORE INFORMATION  Division of STD Prevention (DSTDP), Centers for Disease  Control and Prevention: www.cdc.gov/std American [employer] Association (ASHA): www.ashastd.org  Document Released: 01/03/2005 Document Revised: 12/23/2010 Document Reviewed: 06/26/2008 ExitCare Patient Information 2012 ExitCare, LLCAbdominal Pain, Women Abdominal (stomach, pelvic, or belly) pain can be caused by many things. It is important to tell your doctor:  The location of the pain.   Does it come and go or is it present all the time?   Are there things that start the pain (eating certain foods, exercise)?   Are there other symptoms associated with the pain (fever, nausea, vomiting, diarrhea)?  All of this is helpful to know when trying to find the cause of the pain. CAUSES   Stomach: virus or bacteria infection, or ulcer.   Intestine: appendicitis (inflamed appendix), regional ileitis (Crohn's disease), ulcerative colitis (inflamed colon), irritable bowel syndrome, diverticulitis (inflamed diverticulum of the colon), or cancer of the stomach or intestine.   Gallbladder disease or stones in the gallbladder.   Kidney disease, kidney stones, or infection.   Pancreas infection or cancer.   Fibromyalgia (pain disorder).   Diseases of the female organs:   Uterus: fibroid (non-cancerous) tumors or infection.   Fallopian tubes: infection or tubal pregnancy.   Ovary: cysts or tumors.   Pelvic adhesions (scar tissue).   Endometriosis (uterus lining tissue growing in the pelvis and on the pelvic organs).   Pelvic congestion syndrome (female organs filling up with blood just before the menstrual period).   Pain with the menstrual period.   Pain with ovulation (producing an egg).   Pain with an IUD (intrauterine device, birth control) in the uterus.   Cancer of the female organs.   Functional pain (pain not caused by a disease, may improve without treatment).   Psychological pain.   Depression.  DIAGNOSIS  Your doctor will decide the seriousness of your pain by  doing an examination.  Blood tests.   X-rays.   Ultrasound.   CT scan (computed tomography, special type of X-ray).   MRI (magnetic resonance imaging).   Cultures, for infection.   Barium enema (dye inserted in the large intestine, to better view it with X-rays).   Colonoscopy (looking in intestine with a lighted tube).   Laparoscopy (minor surgery, looking in abdomen with a lighted tube).   Major abdominal exploratory surgery (looking in abdomen with a large incision).  TREATMENT  The treatment will depend on the cause of the pain.   Many cases can be observed and treated at home.   Over-the-counter medicines recommended by your caregiver.   Prescription medicine.   Antibiotics, for infection.   Birth control pills, for painful periods or for ovulation pain.   Hormone treatment, for endometriosis.   Nerve blocking injections.   Physical therapy.   Antidepressants.   Counseling with a psychologist or psychiatrist.   Minor or major surgery.  HOME CARE INSTRUCTIONS   Do not take laxatives, unless directed by your caregiver.   Take over-the-counter pain medicine only if ordered by your   caregiver. Do not take aspirin because it can cause an upset stomach or bleeding.   Try a clear liquid diet (broth or water) as ordered by your caregiver. Slowly move to a bland diet, as tolerated, if the pain is related to the stomach or intestine.   Have a thermometer and take your temperature several times a day, and record it.   Bed rest and sleep, if it helps the pain.   Avoid sexual intercourse, if it causes pain.   Avoid stressful situations.   Keep your follow-up appointments and tests, as your caregiver orders.   If the pain does not go away with medicine or surgery, you may try:   Acupuncture.   Relaxation exercises (yoga, meditation).   Group therapy.   Counseling.  SEEK MEDICAL CARE IF:   You notice certain foods cause stomach pain.   Your home care  treatment is not helping your pain.   You need stronger pain medicine.   You want your IUD removed.   You feel faint or lightheaded.   You develop nausea and vomiting.   You develop a rash.   You are having side effects or an allergy to your medicine.  SEEK IMMEDIATE MEDICAL CARE IF:   Your pain does not go away or gets worse.   You have a fever.   Your pain is felt only in portions of the abdomen. The right side could possibly be appendicitis. The left lower portion of the abdomen could be colitis or diverticulitis.   You are passing blood in your stools (bright red or black tarry stools, with or without vomiting).   You have blood in your urine.   You develop chills, with or without a fever.   You pass out.  MAKE SURE YOU:   Understand these instructions.   Will watch your condition.   Will get help right away if you are not doing well or get worse.  Document Released: 10/31/2006 Document Revised: 12/23/2010 Document Reviewed: 11/20/2008 ExitCare Patient Information 2012 [employer] 

## 2011-05-09 NOTE — ED Notes (Signed)
Low abd /pelvic and back pain,  Vomiting, no diarrhea.  No dysuria.  No vag d/c

## 2011-05-11 LAB — GC/CHLAMYDIA PROBE AMP, GENITAL: Chlamydia, DNA Probe: NEGATIVE

## 2011-05-15 ENCOUNTER — Encounter (HOSPITAL_COMMUNITY): Payer: Self-pay | Admitting: *Deleted

## 2011-05-15 ENCOUNTER — Emergency Department (HOSPITAL_COMMUNITY)
Admission: EM | Admit: 2011-05-15 | Discharge: 2011-05-15 | Disposition: A | Discharge status: Home / Self Care | Payer: Medicaid Other | Attending: Emergency Medicine | Admitting: Emergency Medicine

## 2011-05-15 DIAGNOSIS — N739 Female pelvic inflammatory disease, unspecified: Secondary | ICD-10-CM | POA: Insufficient documentation

## 2011-05-15 DIAGNOSIS — I10 Essential (primary) hypertension: Secondary | ICD-10-CM | POA: Insufficient documentation

## 2011-05-15 DIAGNOSIS — Z79899 Other long term (current) drug therapy: Secondary | ICD-10-CM | POA: Insufficient documentation

## 2011-05-15 DIAGNOSIS — J45909 Unspecified asthma, uncomplicated: Secondary | ICD-10-CM | POA: Insufficient documentation

## 2011-05-15 LAB — URINALYSIS, ROUTINE W REFLEX MICROSCOPIC
Ketones, ur: NEGATIVE mg/dL
Leukocytes, UA: NEGATIVE
Protein, ur: NEGATIVE mg/dL
Urobilinogen, UA: 0.2 mg/dL (ref 0.0–1.0)

## 2011-05-15 LAB — URINE MICROSCOPIC-ADD ON

## 2011-05-15 LAB — POCT PREGNANCY, URINE: Preg Test, Ur: NEGATIVE

## 2011-05-15 LAB — WET PREP, GENITAL
Trich, Wet Prep: NONE SEEN
Yeast Wet Prep HPF POC: NONE SEEN

## 2011-05-15 MED ORDER — HYDROMORPHONE HCL PF 1 MG/ML IJ SOLN
1.0000 mg | Freq: Once | INTRAMUSCULAR | Status: AC
  Administered 2011-05-15: 1 mg via INTRAMUSCULAR
  Filled 2011-05-15: qty 1

## 2011-05-15 MED ORDER — OXYCODONE-ACETAMINOPHEN 5-325 MG PO TABS: 1.0000 | ORAL_TABLET | ORAL | Status: DC | PRN

## 2011-05-15 MED ORDER — LIDOCAINE HCL (CARDIAC) 10 MG/ML IV SOLN
INTRAVENOUS | Status: AC
  Filled 2011-05-15: qty 5

## 2011-05-15 MED ORDER — LIDOCAINE HCL (PF) 2 % IJ SOLN
INTRAMUSCULAR | Status: AC
  Filled 2011-05-15: qty 10

## 2011-05-15 MED ORDER — OXYCODONE-ACETAMINOPHEN 5-325 MG PO TABS
1.0000 | ORAL_TABLET | Freq: Once | ORAL | Status: AC
  Administered 2011-05-15: 1 via ORAL
  Filled 2011-05-15: qty 1

## 2011-05-15 MED ORDER — AZITHROMYCIN 250 MG PO TABS: ORAL_TABLET | ORAL | Status: DC

## 2011-05-15 MED ORDER — ONDANSETRON HCL 4 MG/2ML IJ SOLN
4.0000 mg | Freq: Once | INTRAMUSCULAR | Status: AC
  Administered 2011-05-15: 4 mg via INTRAMUSCULAR
  Filled 2011-05-15: qty 2

## 2011-05-15 MED ORDER — ONDANSETRON 4 MG PO TBDP
4.0000 mg | ORAL_TABLET | Freq: Once | ORAL | Status: AC
  Administered 2011-05-15: 4 mg via ORAL
  Filled 2011-05-15: qty 1

## 2011-05-15 MED ORDER — AZITHROMYCIN 250 MG PO TABS
1000.0000 mg | ORAL_TABLET | Freq: Once | ORAL | Status: AC
  Administered 2011-05-15: 1000 mg via ORAL
  Filled 2011-05-15: qty 4

## 2011-05-15 MED ORDER — CEFTRIAXONE SODIUM 250 MG IJ SOLR
250.0000 mg | Freq: Once | INTRAMUSCULAR | Status: AC
  Administered 2011-05-15: 250 mg via INTRAMUSCULAR
  Filled 2011-05-15: qty 250

## 2011-05-15 NOTE — ED Notes (Signed)
Here for same Monday. Pt states pressure sensation to bladder and lower back pain. Pt states she is now having vaginal discharge. Vomiting. Ran out of phenergan. Dx with bacterial vaginal infection on Monday,.

## 2011-05-15 NOTE — ED Notes (Signed)
Pt reports she vomited up medicine.  No active vomiting seen by RN.  edp notified.

## 2011-05-15 NOTE — ED Provider Notes (Signed)
History    This chart was scribed for Osvaldo Human, MD by Toya Smothers. The patient was seen in room APA08/APA08. Patient's care was started at 1106.   CSN: [id number]  Arrival date & time 05/15/11  1106   None     Chief Complaint  Patient presents with  . Abdominal Pain    (Consider location/radiation/quality/duration/timing/severity/associated sxs/prior treatment) HPI Tanya Krause is a 26 y.o. female who presents to the Emergency Department complaining of constant severe abdominal pain in the lower region and lateral areas onset 6 days ago with associated symptoms of emesis, back pain, pelvic pain, and vaginal discharge. Patient denies previous history of similar symptoms. Pt denies fever, chills, SOB, and cough. Pt was recently evaluated in ED for pelvic pain for which she was treated for vaginal bacterial infection with abx which provided no relief. Pt is an occasional smoker and lists allergies to ASA and Ibuprofen, and has a h/o of tubal ligation.    Past Medical History  Diagnosis Date  . Hypertension   . Migraine   . Asthma   . Renal disorder     Past Surgical History  Procedure Date  . Tubal ligation     Family History  Problem Relation Age of Onset  . Hypertension Mother   . Diabetes Mother   . Heart failure Father     History  Substance Use Topics  . Smoking status: Current Some Day Smoker    Types: Cigarettes  . Smokeless tobacco: Never Used  . Alcohol Use: Yes     occasionally-once a month    OB History    Grav Para Term Preterm Abortions TAB SAB Ect Mult Living   4 4 4       4       Review of Systems  Constitutional: Negative for fever and chills.  HENT: Negative for rhinorrhea.   Eyes: Negative for pain.  Respiratory: Negative for cough and shortness of breath.   Cardiovascular: Negative for chest pain.  Gastrointestinal: Positive for vomiting and abdominal pain. Negative for nausea and diarrhea.  Genitourinary: Positive for vaginal  discharge and vaginal pain. Negative for dysuria.  Musculoskeletal: Negative for back pain.  Skin: Negative for rash.  Neurological: Negative for weakness and headaches.    Allergies  Aspirin; Ibuprofen; Other; Shellfish allergy; and Latex  Home Medications   Current Outpatient Rx  Name Route Sig Dispense Refill  . ACETAMINOPHEN 500 MG PO TABS Oral Take 500-1,000 mg by mouth every 6 (six) hours as needed. Pain    . ALBUTEROL SULFATE HFA 108 (90 BASE) MCG/ACT IN AERS Inhalation Inhale 2 puffs into the lungs every 6 (six) hours as needed. Asthma Symptoms     . ATENOLOL 25 MG PO TABS Oral Take 25 mg by mouth daily.     Marland Kitchen BUTALBITAL-APAP-CAFFEINE 50-325-40 MG PO TABS Oral Take 1 tablet by mouth 2 (two) times daily as needed. FOR MIGRAINE PAIN    . METRONIDAZOLE 0.75 % VA GEL Vaginal Place 1 Applicatorful vaginally 2 (two) times daily. 70 g 0  . OXYCODONE-ACETAMINOPHEN 5-325 MG PO TABS Oral Take 1-2 tablets by mouth every 6 (six) hours as needed for pain. 10 tablet 0  . PROMETHAZINE HCL 25 MG PO TABS Oral Take 1 tablet (25 mg total) by mouth every 6 (six) hours as needed for nausea. 15 tablet 0  . PROMETHAZINE HCL 25 MG PO TABS Oral Take 25 mg by mouth every 6 (six) hours as needed. For nausea     .  PROMETHAZINE HCL 25 MG PO TABS Oral Take 1 tablet (25 mg total) by mouth every 6 (six) hours as needed for nausea. 12 tablet 0  . SUMATRIPTAN SUCCINATE 100 MG PO TABS Oral Take 100 mg by mouth every 2 (two) hours as needed. For migraine       BP 134/81  Pulse 81  Temp(Src) 97.9 F (36.6 C) (Oral)  Resp 16  Ht 5' (1.524 m)  Wt 160 lb (72.576 kg)  BMI 31.25 kg/m2  SpO2 100%  LMP 04/21/2011  Physical Exam  Nursing note and vitals reviewed. Constitutional: She is oriented to person, place, and time. She appears well-developed and well-nourished. No distress.  HENT:  Head: Normocephalic and atraumatic.  Right Ear: External ear normal.  Left Ear: External ear normal.  Mouth/Throat:  Oropharynx is clear and moist. No oropharyngeal exudate.       Dry mouth  Eyes: EOM are normal. Pupils are equal, round, and reactive to light.  Neck: Neck supple. No tracheal deviation present.  Cardiovascular: Normal rate, regular rhythm and normal heart sounds.   Pulmonary/Chest: Effort normal and breath sounds normal. No respiratory distress. She has no wheezes. She has no rales.  Abdominal: Soft. She exhibits no distension. There is tenderness (lower abdomen).  Genitourinary:       Normal external genitalia.  Speculum exam shows blood and whitish discharge.  Bimanual exam shows bilateral adnexal tenderness.   Musculoskeletal: Normal range of motion. She exhibits no edema.  Neurological: She is alert and oriented to person, place, and time. She has normal reflexes. No cranial nerve deficit or sensory deficit.  Skin: Skin is warm and dry.  Psychiatric: She has a normal mood and affect. Her behavior is normal.    ED Course  Procedures (including critical care time)  DIAGNOSTIC STUDIES: Oxygen Saturation is 100% on room air, normal by my interpretation.    COORDINATION OF CARE: 2:30PM Carleene Cooper III, MD discusses test results and plan for discharge.   Labs Reviewed  WET PREP, GENITAL - Abnormal; Notable for the following:    Clue Cells Wet Prep HPF POC FEW (*)    WBC, Wet Prep HPF POC FEW (*)    All other components within normal limits  URINALYSIS, ROUTINE W REFLEX MICROSCOPIC - Abnormal; Notable for the following:    Color, Urine STRAW (*)    Hgb urine dipstick TRACE (*)    All other components within normal limits  POCT PREGNANCY, URINE  URINE MICROSCOPIC-ADD ON  GC/CHLAMYDIA PROBE AMP, GENITAL   Lab results essentially negative.  Will treat for pelvic inflammatory disease with IM Rocephin and oral Zithromax, and Rx Percocet for pain.   1. Pelvic inflammatory disease      I personally performed the services described in this documentation, which was scribed in my  presence. The recorded information has been reviewed and considered.  Osvaldo Human, M.D.   Carleene Cooper III, MD 05/15/11 [phone]

## 2011-05-25 ENCOUNTER — Emergency Department (HOSPITAL_COMMUNITY)
Admission: EM | Admit: 2011-05-25 | Discharge: 2011-05-25 | Disposition: A | Discharge status: Home / Self Care | Payer: Medicaid Other | Attending: Emergency Medicine | Admitting: Emergency Medicine

## 2011-05-25 ENCOUNTER — Encounter (HOSPITAL_COMMUNITY): Payer: Self-pay | Admitting: *Deleted

## 2011-05-25 DIAGNOSIS — J45909 Unspecified asthma, uncomplicated: Secondary | ICD-10-CM | POA: Insufficient documentation

## 2011-05-25 DIAGNOSIS — M549 Dorsalgia, unspecified: Secondary | ICD-10-CM

## 2011-05-25 DIAGNOSIS — F17200 Nicotine dependence, unspecified, uncomplicated: Secondary | ICD-10-CM | POA: Insufficient documentation

## 2011-05-25 DIAGNOSIS — M545 Low back pain, unspecified: Secondary | ICD-10-CM | POA: Insufficient documentation

## 2011-05-25 DIAGNOSIS — Z87440 Personal history of urinary (tract) infections: Secondary | ICD-10-CM | POA: Insufficient documentation

## 2011-05-25 DIAGNOSIS — I10 Essential (primary) hypertension: Secondary | ICD-10-CM | POA: Insufficient documentation

## 2011-05-25 LAB — URINALYSIS, ROUTINE W REFLEX MICROSCOPIC
Bilirubin Urine: NEGATIVE
Glucose, UA: NEGATIVE mg/dL
Ketones, ur: NEGATIVE mg/dL
Leukocytes, UA: NEGATIVE
Nitrite: NEGATIVE
Specific Gravity, Urine: 1.015 (ref 1.005–1.030)
pH: 7 (ref 5.0–8.0)

## 2011-05-25 MED ORDER — DIAZEPAM 5 MG PO TABS: 5.0000 mg | ORAL_TABLET | Freq: Two times a day (BID) | ORAL | Status: AC

## 2011-05-25 MED ORDER — OXYCODONE-ACETAMINOPHEN 5-325 MG PO TABS
1.0000 | ORAL_TABLET | Freq: Once | ORAL | Status: AC
  Administered 2011-05-25: 1 via ORAL
  Filled 2011-05-25: qty 1

## 2011-05-25 NOTE — Discharge Instructions (Signed)
SEEK IMMEDIATE MEDICAL ATTENTION IF: New numbness, tingling, weakness, or problem with the use of your arms or legs.  Severe back pain not relieved with medications.  Change in bowel or bladder control (if you lose control of stool or urine, or if you are unable to urinate) Increasing pain in any areas of the body (such as chest or abdominal pain).  Shortness of breath, dizziness or fainting.  Nausea (feeling sick to your stomach), vomiting, fever, or sweats.  RESOURCE GUIDE  Dental Problems  Patients with Medicaid: [facility] [phone]                                           [phone] Phone:  [phone]                                                  Phone:  [phone]  If unable to pay or uninsured, contact:  [facility] or [facility]. to become qualified for the adult dental clinic.  Chronic Pain Problems Contact [facility]  [phone] Patients need to be referred by their primary care doctor.  Insufficient Money for Medicine Contact [employer]:  call "211" or [facility] [phone].  No Primary Care Doctor Call [facility]  [phone] Other agencies that provide inexpensive medical care    [facility]  [phone]    [facility]  [phone]    [facility]  [phone]    [facility]  [phone]    [facility]  [phone]    [facility]  [phone]  Psychological Services [facility]  [phone] [facility] Services  [phone] [facility]   [phone] (emergency services 9185899405)  Substance Abuse Resources [facility]  [phone] [facility] [phone] [facility] [phone] Floydene Flock [phone] Residential & Outpatient Substance Abuse Program  9415254955  Abuse/Neglect [facility] Child Abuse Hotline [phone] [facility] Child Abuse Hotline 651-339-6510 (After Hours)  Emergency Shelter [facility] Ministries [phone]  Maternity Homes [facility] [place] [facility] 7402391822 [facility] 3031098784  MRSA Hotline #:   305-331-6019    [facility] Resources  [facility]     [employer]                          [facility] Dept. [address] [place]                       [address]      [address]  Patrecia Pace  [facility] Phone:  [phone]                                   Phone:  [phone]                 Phone:  [phone]  [facility] Mental Health Phone:  [phone]  [facility] Child Abuse Hotline [phone] [phone] (After Hours)

## 2011-05-25 NOTE — ED Provider Notes (Signed)
History   This chart was scribed for Joya Gaskins, MD by Sofie Rower. The patient was seen in room APA03/APA03 and the patient's care was started at 1:15 PM     CSN: [id number]  Arrival date & time 05/25/11  1151   First MD Initiated Contact with Patient 05/25/11 1302      Chief Complaint  Patient presents with  . Back Pain  . Emesis     HPI  Tanya Krause is a 26 y.o. female who presents to the Emergency Department complaining of moderate, episodic back pain onset three weeks ago with associated symptoms of vomiting, pain in her legs. The pt states "she came to visit [facility] three weeks ago and was diagnosed with a bacterial infection." Modifying factors include walking which intensifies pain in the legs. Pt has a hx of kidney infections.  Pt denies fall, back surgeries, hx of diabetes, abdominal pain, numbness in the feet.  No focal weakness No fecal/urinary incontinence reported No dysuria No vaginal complaints   Pt does not have a PCP.   Past Medical History  Diagnosis Date  . Hypertension   . Migraine   . Asthma   . Migraines   . Renal disorder     kidney infections    Past Surgical History  Procedure Date  . Tubal ligation     Family History  Problem Relation Age of Onset  . Hypertension Mother   . Diabetes Mother   . Heart failure Father     History  Substance Use Topics  . Smoking status: Current Some Day Smoker    Types: Cigarettes  . Smokeless tobacco: Never Used  . Alcohol Use: Yes     occasionally-once a month    OB History    Grav Para Term Preterm Abortions TAB SAB Ect Mult Living   4 4 4       4       Review of Systems  All other systems reviewed and are negative.   10 Systems reviewed and all are negative for acute change except as noted in the HPI.   Allergies  Aspirin; Ibuprofen; Other; Shellfish allergy; and Latex  Home Medications   Current Outpatient Rx  Name Route Sig Dispense Refill  . ACETAMINOPHEN 500 MG PO TABS  Oral Take 500-1,000 mg by mouth every 6 (six) hours as needed. Pain    . ATENOLOL 25 MG PO TABS Oral Take 25 mg by mouth daily.     Marland Kitchen BUTALBITAL-APAP-CAFFEINE 50-325-40 MG PO TABS Oral Take 1 tablet by mouth 2 (two) times daily as needed. FOR MIGRAINE PAIN    . PROMETHAZINE HCL 25 MG PO TABS Oral Take 25 mg by mouth every 6 (six) hours as needed. For nausea    . SUMATRIPTAN SUCCINATE 100 MG PO TABS Oral Take 100 mg by mouth every 2 (two) hours as needed. For migraine     . ALBUTEROL SULFATE HFA 108 (90 BASE) MCG/ACT IN AERS Inhalation Inhale 2 puffs into the lungs every 6 (six) hours as needed. Asthma Symptoms       BP 141/95  Pulse 95  Temp(Src) 97.8 F (36.6 C) (Oral)  Resp 18  Ht 5' (1.524 m)  Wt 160 lb (72.576 kg)  BMI 31.25 kg/m2  SpO2 100%  LMP 05/18/2011  Physical Exam  CONSTITUTIONAL: Well developed/well nourished HEAD AND FACE: Normocephalic/atraumatic EYES: EOMI/PERRL ENMT: Mucous membranes moist NECK: supple no meningeal signs SPINE: Diffuse tenderness of thoracic and lumbar spine and paraspinal  tenderness, No bruising/crepitance/stepoffs noted to spine CV: S1/S2 noted, no murmurs/rubs/gallops noted LUNGS: Lungs are clear to auscultation bilaterally, no apparent distress ABDOMEN: soft, nontender, no rebound or guarding GU:no cva tenderness NEURO: Pt is awake/alert, moves all extremitiesx4 EXTREMITIES: pulses normal, full ROM SKIN: warm, color normal PSYCH: no abnormalities of mood noted   ED Course  Procedures   1:22PM- EDP at bedside discusses treatment plan concerning muscle spasms.  Will tx for back spasms.  No focal neuro deficits.  Stable for d/c  The patient appears reasonably screened and/or stabilized for discharge and I doubt any other medical condition or other [facility] requiring further screening, evaluation, or treatment in the ED at this time prior to discharge.   Labs Reviewed  URINALYSIS, ROUTINE W REFLEX MICROSCOPIC     MDM  Nursing notes  reviewed and considered in documentation All labs/vitals reviewed and considered      I personally performed the services described in this documentation, which was scribed in my presence. The recorded information has been reviewed and considered.     Joya Gaskins, MD 05/25/11 [phone]

## 2011-05-25 NOTE — ED Notes (Signed)
Pt states neck pain radiating down entire back with constant pain to right lower back. Pt states she has vomited x 3 today.

## 2011-06-11 ENCOUNTER — Encounter (HOSPITAL_COMMUNITY): Payer: Self-pay | Admitting: *Deleted

## 2011-06-11 ENCOUNTER — Emergency Department (HOSPITAL_COMMUNITY)
Admission: EM | Admit: 2011-06-11 | Discharge: 2011-06-11 | Disposition: A | Discharge status: Home / Self Care | Payer: Medicaid Other | Attending: Emergency Medicine | Admitting: Emergency Medicine

## 2011-06-11 DIAGNOSIS — J45909 Unspecified asthma, uncomplicated: Secondary | ICD-10-CM | POA: Insufficient documentation

## 2011-06-11 DIAGNOSIS — I10 Essential (primary) hypertension: Secondary | ICD-10-CM | POA: Insufficient documentation

## 2011-06-11 DIAGNOSIS — G43909 Migraine, unspecified, not intractable, without status migrainosus: Secondary | ICD-10-CM | POA: Insufficient documentation

## 2011-06-11 MED ORDER — ONDANSETRON HCL 4 MG/2ML IJ SOLN
4.0000 mg | Freq: Once | INTRAMUSCULAR | Status: AC
  Administered 2011-06-11: 4 mg via INTRAVENOUS
  Filled 2011-06-11: qty 2

## 2011-06-11 MED ORDER — SODIUM CHLORIDE 0.9 % IV BOLUS (SEPSIS)
1000.0000 mL | Freq: Once | INTRAVENOUS | Status: AC
  Administered 2011-06-11: 1000 mL via INTRAVENOUS

## 2011-06-11 MED ORDER — DIAZEPAM 5 MG PO TABS
5.0000 mg | ORAL_TABLET | Freq: Once | ORAL | Status: AC
  Administered 2011-06-11: 5 mg via ORAL
  Filled 2011-06-11: qty 1

## 2011-06-11 MED ORDER — METOCLOPRAMIDE HCL 5 MG/ML IJ SOLN
10.0000 mg | Freq: Once | INTRAMUSCULAR | Status: AC
  Administered 2011-06-11: 10 mg via INTRAVENOUS
  Filled 2011-06-11: qty 2

## 2011-06-11 MED ORDER — DEXAMETHASONE SODIUM PHOSPHATE 10 MG/ML IJ SOLN
10.0000 mg | Freq: Once | INTRAMUSCULAR | Status: AC
  Administered 2011-06-11: 10 mg via INTRAVENOUS
  Filled 2011-06-11: qty 1

## 2011-06-11 MED ORDER — HYDROCODONE-ACETAMINOPHEN 5-325 MG PO TABS: 1.0000 | ORAL_TABLET | ORAL | Status: AC | PRN

## 2011-06-11 MED ORDER — DIPHENHYDRAMINE HCL 50 MG/ML IJ SOLN
50.0000 mg | Freq: Once | INTRAMUSCULAR | Status: AC
  Administered 2011-06-11: 50 mg via INTRAVENOUS
  Filled 2011-06-11: qty 1

## 2011-06-11 MED ORDER — DIAZEPAM 5 MG PO TABS: 5.0000 mg | ORAL_TABLET | Freq: Three times a day (TID) | ORAL | Status: AC | PRN

## 2011-06-11 MED ORDER — HYDROMORPHONE HCL PF 1 MG/ML IJ SOLN
1.0000 mg | Freq: Once | INTRAMUSCULAR | Status: AC
  Administered 2011-06-11: 1 mg via INTRAVENOUS
  Filled 2011-06-11: qty 1

## 2011-06-11 NOTE — ED Notes (Signed)
Patient would like something else for pain states her migraine has eased off a little bit but not much. Patient would also like something to eat at this time.

## 2011-06-11 NOTE — ED Notes (Signed)
Pt c/o migraine headache, dizziness, neck pain and nausea since yesterday. Pt states that she was supposed to go to her Migraine MD for an infusion but could not get transportation.

## 2011-06-11 NOTE — Discharge Instructions (Signed)
Migraine Headache A migraine headache is an intense, throbbing pain on one or both sides of your head. The exact cause of a migraine headache is not always known. A migraine may be caused when nerves in the brain become irritated and release chemicals that cause swelling within blood vessels, causing pain. Many migraine sufferers have a family history of migraines. Before you get a migraine you may or may not get an aura. An aura is a group of symptoms that can predict the beginning of a migraine. An aura may include:  Visual changes such as:   Flashing lights.   Bright spots or zig-zag lines.   Tunnel vision.   Feelings of numbness.   Trouble talking.   Muscle weakness.  SYMPTOMS  Pain on one or both sides of your head.   Pain that is pulsating or throbbing in nature.   Pain that is severe enough to prevent daily activities.   Pain that is aggravated by any daily physical activity.   Nausea (feeling sick to your stomach), vomiting, or both.   Pain with exposure to bright lights, loud noises, or activity.   General sensitivity to bright lights or loud noises.  MIGRAINE TRIGGERS Examples of triggers of migraine headaches include:   Alcohol.   Smoking.   Stress.   It may be related to menses (female menstruation).   Aged cheeses.   Foods or drinks that contain nitrates, glutamate, aspartame, or tyramine.   Lack of sleep.   Chocolate.   Caffeine.   Hunger.   Medications such as nitroglycerine (used to treat chest pain), birth control pills, estrogen, and some blood pressure medications.  DIAGNOSIS  A migraine headache is often diagnosed based on:  Symptoms.   Physical examination.   A computerized X-ray scan (computed tomography, CT) of your head.  TREATMENT  Medications can help prevent migraines if they are recurrent or should they become recurrent. Your caregiver can help you with a medication or treatment program that will be helpful to you.   Lying  down in a dark, quiet room may be helpful.   Keeping a headache diary may help you find a trend as to what may be triggering your headaches.  SEEK IMMEDIATE MEDICAL CARE IF:   You have confusion, personality changes or seizures.   You have headaches that wake you from sleep.   You have an increased frequency in your headaches.   You have a stiff neck.   You have a loss of vision.   You have muscle weakness.   You start losing your balance or have trouble walking.   You feel faint or pass out.  MAKE SURE YOU:   Understand these instructions.   Will watch your condition.   Will get help right away if you are not doing well or get worse.  Document Released: 01/03/2005 Document Revised: 12/23/2010 Document Reviewed: 08/19/2008 [facility] Patient Information 2012 [place], [place].   Go home and rest after receiving the narcotic injection.  Do not drive within 4 hours as this medicine will make you drowsy.  Plan to see your doctor at [facility] neurology if your headache persists.

## 2011-06-12 ENCOUNTER — Encounter (HOSPITAL_COMMUNITY): Payer: Self-pay | Admitting: *Deleted

## 2011-06-12 ENCOUNTER — Emergency Department (HOSPITAL_COMMUNITY)
Admission: EM | Admit: 2011-06-12 | Discharge: 2011-06-12 | Disposition: A | Discharge status: Home / Self Care | Payer: Medicaid Other | Attending: Emergency Medicine | Admitting: Emergency Medicine

## 2011-06-12 DIAGNOSIS — I10 Essential (primary) hypertension: Secondary | ICD-10-CM | POA: Insufficient documentation

## 2011-06-12 DIAGNOSIS — G43909 Migraine, unspecified, not intractable, without status migrainosus: Secondary | ICD-10-CM | POA: Insufficient documentation

## 2011-06-12 DIAGNOSIS — Z79899 Other long term (current) drug therapy: Secondary | ICD-10-CM | POA: Insufficient documentation

## 2011-06-12 DIAGNOSIS — M542 Cervicalgia: Secondary | ICD-10-CM

## 2011-06-12 MED ORDER — PROMETHAZINE HCL 25 MG/ML IJ SOLN
12.5000 mg | Freq: Once | INTRAMUSCULAR | Status: AC
  Administered 2011-06-12: 12.5 mg via INTRAVENOUS
  Filled 2011-06-12: qty 1

## 2011-06-12 MED ORDER — LORAZEPAM 2 MG/ML IJ SOLN
1.0000 mg | Freq: Once | INTRAMUSCULAR | Status: AC
  Administered 2011-06-12: 1 mg via INTRAVENOUS
  Filled 2011-06-12: qty 1

## 2011-06-12 MED ORDER — MAGNESIUM SULFATE 40 MG/ML IJ SOLN
2.0000 g | INTRAMUSCULAR | Status: AC
  Administered 2011-06-12: 2 g via INTRAVENOUS
  Filled 2011-06-12: qty 50

## 2011-06-12 MED ORDER — METHOCARBAMOL 500 MG PO TABS
1000.0000 mg | ORAL_TABLET | Freq: Once | ORAL | Status: AC
  Administered 2011-06-12: 1000 mg via ORAL
  Filled 2011-06-12: qty 2

## 2011-06-12 MED ORDER — SODIUM CHLORIDE 0.9 % IV BOLUS (SEPSIS)
1000.0000 mL | Freq: Once | INTRAVENOUS | Status: AC
  Administered 2011-06-12: 1000 mL via INTRAVENOUS

## 2011-06-12 MED ORDER — SODIUM CHLORIDE 0.9 % IV SOLN: INTRAVENOUS | Status: DC

## 2011-06-12 MED ORDER — PROMETHAZINE HCL 25 MG/ML IJ SOLN
12.5000 mg | Freq: Four times a day (QID) | INTRAMUSCULAR | Status: DC | PRN
  Administered 2011-06-12: 12.5 mg via INTRAVENOUS
  Filled 2011-06-12: qty 1

## 2011-06-12 MED ORDER — VALPROATE SODIUM 500 MG/5ML IV SOLN
1000.0000 mg | Freq: Once | INTRAVENOUS | Status: AC
  Administered 2011-06-12: 1000 mg via INTRAVENOUS
  Filled 2011-06-12: qty 10

## 2011-06-12 MED ORDER — METOCLOPRAMIDE HCL 5 MG/ML IJ SOLN
10.0000 mg | Freq: Once | INTRAMUSCULAR | Status: AC
  Administered 2011-06-12: 10 mg via INTRAVENOUS
  Filled 2011-06-12: qty 2

## 2011-06-12 MED ORDER — DIPHENHYDRAMINE HCL 50 MG/ML IJ SOLN
25.0000 mg | Freq: Once | INTRAMUSCULAR | Status: AC
  Administered 2011-06-12: 25 mg via INTRAVENOUS
  Filled 2011-06-12: qty 1

## 2011-06-12 NOTE — ED Provider Notes (Signed)
History     CSN: [id number]  Arrival date & time 06/12/11  1506   First MD Initiated Contact with Patient 06/12/11 1559      Chief Complaint  Patient presents with  . Migraine    (Consider location/radiation/quality/duration/timing/severity/associated sxs/prior treatment) HPI  Patient relates she was seen yesterday for headaches and felt better. However 2:30 AM she started having diffuse throbbing headache that radiates down into her neck muscles and states it feels sore. She states it progressively got worse during the day. She has nausea with vomiting and feels like her vision is blurred. She states her legs have felt numb off and on. She relates she is followed by Dr. Geraldine Contras for chronic headaches. She relates she missed her last appointment with him which was May 23. She was given a prescription last night for Valium and Norco, however she states she is unable to get it filled today. She states she is supposed to get it filled from a pharmacy that closed earlier this afternoon, and states other pharmacies will not let her buy the prescriptions.  Neurologist Dr Marjory Lies,   Past Medical History  Diagnosis Date  . Hypertension   . Migraine   . Asthma   . Migraines   . Renal disorder     kidney infections    Past Surgical History  Procedure Date  . Tubal ligation     Family History  Problem Relation Age of Onset  . Hypertension Mother   . Diabetes Mother   . Heart failure Father     History  Substance Use Topics  . Smoking status: Current Some Day Smoker    Types: Cigarettes  . Smokeless tobacco: Never Used  . Alcohol Use: Yes     occasionally-once a month  trying to get on disability for migraines unemployed  OB History    Grav Para Term Preterm Abortions TAB SAB Ect Mult Living   4 4 4       4       Review of Systems  All other systems reviewed and are negative.    Allergies  Aspirin; Ibuprofen; Other; Shellfish allergy; and Latex  Home  Medications   Current Outpatient Rx  Name Route Sig Dispense Refill  . ACETAMINOPHEN 500 MG PO TABS Oral Take 500-1,000 mg by mouth every 6 (six) hours as needed. Pain    . ALBUTEROL SULFATE HFA 108 (90 BASE) MCG/ACT IN AERS Inhalation Inhale 2 puffs into the lungs every 6 (six) hours as needed. Asthma Symptoms     . ATENOLOL 25 MG PO TABS Oral Take 25 mg by mouth daily.     Marland Kitchen BUTALBITAL-APAP-CAFFEINE 50-325-40 MG PO TABS Oral Take 1 tablet by mouth 2 (two) times daily as needed. FOR MIGRAINE PAIN    . PROMETHAZINE HCL 25 MG PO TABS Oral Take 25 mg by mouth every 6 (six) hours as needed. For nausea    . SUMATRIPTAN SUCCINATE 100 MG PO TABS Oral Take 100 mg by mouth every 2 (two) hours as needed. For migraine     . DIAZEPAM 5 MG PO TABS Oral Take 1 tablet (5 mg total) by mouth every 8 (eight) hours as needed (muscle spasm). 15 tablet 0  . HYDROCODONE-ACETAMINOPHEN 5-325 MG PO TABS Oral Take 1 tablet by mouth every 4 (four) hours as needed for pain. 15 tablet 0    BP 144/99  Pulse 88  Temp(Src) 98.7 F (37.1 C) (Oral)  Resp 16  SpO2 100%  LMP 05/18/2011  Vital signs normal    Physical Exam  Constitutional: She is oriented to person, place, and time. She appears well-developed and well-nourished.  Non-toxic appearance. She does not appear ill. She appears distressed.  HENT:  Head: Normocephalic and atraumatic.  Right Ear: External ear normal.  Left Ear: External ear normal.  Nose: Nose normal. No mucosal edema or rhinorrhea.  Mouth/Throat: Mucous membranes are normal. No dental abscesses or uvula swelling.       Tongue dry  Eyes: Conjunctivae and EOM are normal. Pupils are equal, round, and reactive to light.  Neck: Normal range of motion and full passive range of motion without pain. Neck supple.       Tender paraspinous muscles and upper trapezius muscles to palpation, however patient is moving her head freely during the course of conversation  Pulmonary/Chest: Effort normal and  breath sounds normal. No respiratory distress. She has no rhonchi. She exhibits no crepitus.  Abdominal: Normal appearance.  Musculoskeletal: Normal range of motion. She exhibits no edema and no tenderness.       Moves all extremities well.   Neurological: She is alert and oriented to person, place, and time. She has normal strength. No cranial nerve deficit.  Skin: Skin is warm, dry and intact. No rash noted. No erythema. No pallor.  Psychiatric: Her speech is normal and behavior is normal. Her mood appears not anxious.       Flat affect    ED Course  Procedures (including critical care time)    Medications  0.9 %  sodium chloride infusion (not administered)  promethazine (PHENERGAN) injection 12.5 mg (not administered)  sodium chloride 0.9 % bolus 1,000 mL (1000 mL Intravenous Given 06/12/11 1705)  metoCLOPramide (REGLAN) injection 10 mg (10 mg Intravenous Given 06/12/11 1704)  diphenhydrAMINE (BENADRYL) injection 25 mg (25 mg Intravenous Given 06/12/11 1704)  LORazepam (ATIVAN) injection 1 mg (1 mg Intravenous Given 06/12/11 1704)  methocarbamol (ROBAXIN) tablet 1,000 mg (1000 mg Oral Given 06/12/11 1819)  magnesium sulfate IVPB 2 g 50 mL (2 g Intravenous Given 06/12/11 1820)  valproate (DEPACON) 1,000 mg in dextrose 5 % 50 mL IVPB (1000 mg Intravenous Given 06/12/11 1835)  promethazine (PHENERGAN) injection 12.5 mg (12.5 mg Intravenous Given 06/12/11 2026)   Patient was started with Reglan and Benadryl with Ativan IV she relates her headache was not improving. She then was given magnesium, Depacon, and Robaxin. When she was rechecked after that she states her headache was improving and she just had some nausea and thought that would get rid of her headache if she gets more nausea medication.   1. Migraine headache   2. Muscle pain, cervical     Plan discharge  Devoria Albe, MD, FACEP   MDM          Ward Givens, MD 06/12/11 2228

## 2011-06-12 NOTE — ED Notes (Signed)
Pt here yesterday for same. Headache, nausea, vomiting when eating. PT states she can only get Rx filled at [facility] due to Medicaid, which closed at 11am today. Rx for valium and NORCO. NAD.

## 2011-06-12 NOTE — Discharge Instructions (Signed)
Go home and drink plenty of fluids and go to bed. Get your prescriptions filled when the pharmacies open. You need to call Dr Richrd Humbles office  to get another appointment since you missed your appointment last week.

## 2011-06-13 NOTE — ED Provider Notes (Signed)
History     CSN: [id number]  Arrival date & time 06/11/11  1608   First MD Initiated Contact with Patient 06/11/11 1645      Chief Complaint  Patient presents with  . Migraine    (Consider location/radiation/quality/duration/timing/severity/associated sxs/prior treatment) HPI Comments: Tanya Krause presents with complaint of migraine headache which started yesterday. Her headache is similar to previous headaches,  Reports deep throbbing pain across forehead,  But also states she has posterior pain that radiates down her neck and across her shoulders and upper back.  She denies stiffness in her neck,  Also denies fever and chills. She missed her appointment this week with [facility] for an "infusion" (patient is unsure of the meds used for this) due to lack of transportation.  She has nausea and photophobia,  But denies vomiting.  She has had decreased po intake and reports feeling weak.  Patient is a 26 y.o. female presenting with migraine. The history is provided by the patient.  Migraine Associated symptoms include headaches, nausea and neck pain. Pertinent negatives include no abdominal pain, chest pain, fever, joint swelling, numbness, rash or weakness.    Past Medical History  Diagnosis Date  . Hypertension   . Migraine   . Asthma   . Migraines   . Renal disorder     kidney infections    Past Surgical History  Procedure Date  . Tubal ligation     Family History  Problem Relation Age of Onset  . Hypertension Mother   . Diabetes Mother   . Heart failure Father     History  Substance Use Topics  . Smoking status: Current Some Day Smoker    Types: Cigarettes  . Smokeless tobacco: Never Used  . Alcohol Use: Yes     occasionally-once a month    OB History    Grav Para Term Preterm Abortions TAB SAB Ect Mult Living   4 4 4       4       Review of Systems  Constitutional: Negative for fever.  HENT: Positive for neck pain. Negative for neck  stiffness.   Eyes: Positive for photophobia. Negative for visual disturbance.  Respiratory: Negative for shortness of breath.   Cardiovascular: Negative for chest pain and leg swelling.  Gastrointestinal: Positive for nausea. Negative for abdominal pain, constipation and abdominal distention.  Genitourinary: Negative for dysuria, urgency, frequency, flank pain and difficulty urinating.  Musculoskeletal: Negative for back pain, joint swelling and gait problem.  Skin: Negative for rash.  Neurological: Positive for light-headedness and headaches. Negative for weakness and numbness.    Allergies  Aspirin; Ibuprofen; Other; Shellfish allergy; and Latex  Home Medications   Current Outpatient Rx  Name Route Sig Dispense Refill  . ACETAMINOPHEN 500 MG PO TABS Oral Take 500-1,000 mg by mouth every 6 (six) hours as needed. Pain    . ALBUTEROL SULFATE HFA 108 (90 BASE) MCG/ACT IN AERS Inhalation Inhale 2 puffs into the lungs every 6 (six) hours as needed. Asthma Symptoms     . ATENOLOL 25 MG PO TABS Oral Take 25 mg by mouth daily.     Marland Kitchen BUTALBITAL-APAP-CAFFEINE 50-325-40 MG PO TABS Oral Take 1 tablet by mouth 2 (two) times daily as needed. FOR MIGRAINE PAIN    . PROMETHAZINE HCL 25 MG PO TABS Oral Take 25 mg by mouth every 6 (six) hours as needed. For nausea    . SUMATRIPTAN SUCCINATE 100 MG PO TABS Oral Take 100 mg by  mouth every 2 (two) hours as needed. For migraine     . DIAZEPAM 5 MG PO TABS Oral Take 1 tablet (5 mg total) by mouth every 8 (eight) hours as needed (muscle spasm). 15 tablet 0  . HYDROCODONE-ACETAMINOPHEN 5-325 MG PO TABS Oral Take 1 tablet by mouth every 4 (four) hours as needed for pain. 15 tablet 0    BP 113/66  Pulse 82  Temp(Src) 98.2 F (36.8 C) (Oral)  Resp 20  Ht 5' (1.524 m)  Wt 160 lb (72.576 kg)  BMI 31.25 kg/m2  SpO2 97%  LMP 05/18/2011  Physical Exam  Nursing note and vitals reviewed. Constitutional: She is oriented to person, place, and time. She appears  well-developed and well-nourished.       Uncomfortable appearing  HENT:  Head: Normocephalic and atraumatic.  Mouth/Throat: Oropharynx is clear and moist.  Eyes: EOM are normal. Pupils are equal, round, and reactive to light.  Neck: Normal range of motion. Neck supple. Muscular tenderness present. No rigidity. Normal range of motion present.       TTP paracervical muscles.  Pt has FROM of c spine .  Cardiovascular: Normal rate and normal heart sounds.   Pulmonary/Chest: Effort normal.  Abdominal: Soft. There is no tenderness.  Musculoskeletal: Normal range of motion.  Lymphadenopathy:    She has no cervical adenopathy.  Neurological: She is alert and oriented to person, place, and time. She has normal strength. No sensory deficit. Gait normal. GCS eye subscore is 4. GCS verbal subscore is 5. GCS motor subscore is 6.       Normal heel-shin, normal rapid alternating movements. Cranial nerves III-XII intact.  No pronator drift.  Skin: Skin is warm and dry. No rash noted.  Psychiatric: She has a normal mood and affect. Her speech is normal and behavior is normal. Thought content normal. Cognition and memory are normal.    ED Course  Procedures (including critical care time)  Labs Reviewed - No data to display No results found.   1. Migraine    Pt given IV fluids,  Reglan,  Benadryl and decadron with minimal pain relief.  Added dilaudid 1 mg IM along with zofran,  Added valium po for muscle spasm.  Pain improved at time of discharge.   MDM  Patient with typical migraine with paracervical muscle spasm,  No nuccal rigidity.  Pt prescribed hydrocodone,  Valium.  Advised f/u with neurologist if sx persist.        Burgess Amor, PA 06/13/11 2115

## 2011-06-16 NOTE — ED Provider Notes (Signed)
Medical screening examination/treatment/procedure(s) were performed by non-physician practitioner and as supervising physician I was immediately available for consultation/collaboration.   Hurman Horn, MD 06/16/11 [phone]

## 2011-09-21 ENCOUNTER — Emergency Department (HOSPITAL_COMMUNITY)
Admission: EM | Admit: 2011-09-21 | Discharge: 2011-09-21 | Disposition: A | Discharge status: Home / Self Care | Payer: Medicaid Other | Attending: Emergency Medicine | Admitting: Emergency Medicine

## 2011-09-21 ENCOUNTER — Encounter (HOSPITAL_COMMUNITY): Payer: Self-pay

## 2011-09-21 ENCOUNTER — Emergency Department (HOSPITAL_COMMUNITY): Payer: Medicaid Other

## 2011-09-21 DIAGNOSIS — M25562 Pain in left knee: Secondary | ICD-10-CM

## 2011-09-21 DIAGNOSIS — F17200 Nicotine dependence, unspecified, uncomplicated: Secondary | ICD-10-CM | POA: Insufficient documentation

## 2011-09-21 DIAGNOSIS — Z79899 Other long term (current) drug therapy: Secondary | ICD-10-CM | POA: Insufficient documentation

## 2011-09-21 DIAGNOSIS — M25569 Pain in unspecified knee: Secondary | ICD-10-CM | POA: Insufficient documentation

## 2011-09-21 DIAGNOSIS — J45909 Unspecified asthma, uncomplicated: Secondary | ICD-10-CM | POA: Insufficient documentation

## 2011-09-21 DIAGNOSIS — I10 Essential (primary) hypertension: Secondary | ICD-10-CM | POA: Insufficient documentation

## 2011-09-21 HISTORY — DX: Female pelvic inflammatory disease, unspecified: N73.9

## 2011-09-21 LAB — URINALYSIS, ROUTINE W REFLEX MICROSCOPIC
Glucose, UA: NEGATIVE mg/dL
Ketones, ur: 15 mg/dL — AB
Leukocytes, UA: NEGATIVE
Nitrite: NEGATIVE
Protein, ur: NEGATIVE mg/dL
pH: 6.5 (ref 5.0–8.0)

## 2011-09-21 LAB — PREGNANCY, URINE: Preg Test, Ur: NEGATIVE

## 2011-09-21 MED ORDER — OXYCODONE-ACETAMINOPHEN 5-325 MG PO TABS
1.0000 | ORAL_TABLET | Freq: Once | ORAL | Status: AC
  Administered 2011-09-21: 1 via ORAL
  Filled 2011-09-21: qty 1

## 2011-09-21 MED ORDER — HYDROMORPHONE HCL PF 2 MG/ML IJ SOLN
2.0000 mg | Freq: Once | INTRAMUSCULAR | Status: AC
  Administered 2011-09-21: 2 mg via INTRAMUSCULAR
  Filled 2011-09-21: qty 1

## 2011-09-21 MED ORDER — PROMETHAZINE HCL 25 MG/ML IJ SOLN
INTRAMUSCULAR | Status: AC
  Filled 2011-09-21: qty 1

## 2011-09-21 MED ORDER — OXYCODONE-ACETAMINOPHEN 5-325 MG PO TABS: ORAL_TABLET | ORAL | Status: AC

## 2011-09-21 MED ORDER — ONDANSETRON 8 MG PO TBDP
ORAL_TABLET | ORAL | Status: AC
  Administered 2011-09-21: 8 mg
  Filled 2011-09-21: qty 1

## 2011-09-21 MED ORDER — LORAZEPAM 2 MG/ML IJ SOLN
1.0000 mg | Freq: Once | INTRAMUSCULAR | Status: AC
  Administered 2011-09-21: 1 mg via INTRAMUSCULAR
  Filled 2011-09-21: qty 1

## 2011-09-21 MED ORDER — PROMETHAZINE HCL 25 MG/ML IJ SOLN
25.0000 mg | Freq: Once | INTRAMUSCULAR | Status: AC
  Administered 2011-09-21: 25 mg via INTRAMUSCULAR

## 2011-09-21 NOTE — ED Notes (Signed)
Left knee pain and swelling for 2 weeks, has not been to doctor.

## 2011-09-21 NOTE — ED Notes (Signed)
Pt c/o nausea. Emesis bag given to pt. Pt waiting MD eval

## 2011-09-21 NOTE — ED Provider Notes (Signed)
History     CSN: [id number]  Arrival date & time 09/21/11  1109   First MD Initiated Contact with Patient 09/21/11 1322      Chief Complaint  Patient presents with  . Knee Pain    HPI Pt was seen at 1330.  Per pt, c/o gradual onset and persistence of constant left knee "pain" 2 weeks.  Pt states she "just woke up with pain."  Cannot recall injury.  Denies fevers, no rash, no swelling, no focal motor weakness, no tingling/numbness in extremities, no back pain, no other joints pain.    Past Medical History  Diagnosis Date  . Hypertension   . Migraine   . Asthma   . Pelvic inflammatory disease (PID)     Past Surgical History  Procedure Date  . Tubal ligation     Family History  Problem Relation Age of Onset  . Hypertension Mother   . Diabetes Mother   . Heart failure Father     History  Substance Use Topics  . Smoking status: Current Some Day Smoker    Types: Cigarettes  . Smokeless tobacco: Never Used  . Alcohol Use: Yes     occasionally-once a month    OB History    Grav Para Term Preterm Abortions TAB SAB Ect Mult Living   4 4 4       4       Review of Systems ROS: Statement: All systems negative except as marked or noted in the HPI; Constitutional: Negative for fever and chills. ; ; Eyes: Negative for eye pain, redness and discharge. ; ; ENMT: Negative for ear pain, hoarseness, nasal congestion, sinus pressure and sore throat. ; ; Cardiovascular: Negative for chest pain, palpitations, diaphoresis, dyspnea and peripheral edema. ; ; Respiratory: Negative for cough, wheezing and stridor. ; ; Gastrointestinal: Negative for nausea, vomiting, diarrhea, abdominal pain, blood in stool, hematemesis, jaundice and rectal bleeding. . ; ; Genitourinary: Negative for dysuria, flank pain and hematuria. ; ; Musculoskeletal: +knee pain. Negative for back pain and neck pain. Negative for swelling and trauma.; ; Skin: Negative for pruritus, rash, abrasions, blisters, bruising and  skin lesion.; ; Neuro: Negative for headache, lightheadedness and neck stiffness. Negative for weakness, altered level of consciousness , altered mental status, extremity weakness, paresthesias, involuntary movement, seizure and syncope.       Allergies  Aspirin; Ibuprofen; Other; Shellfish allergy; and Latex  Home Medications   Current Outpatient Rx  Name Route Sig Dispense Refill  . ACETAMINOPHEN 500 MG PO TABS Oral Take 500-1,000 mg by mouth every 6 (six) hours as needed. Pain    . ALBUTEROL SULFATE HFA 108 (90 BASE) MCG/ACT IN AERS Inhalation Inhale 2 puffs into the lungs every 6 (six) hours as needed. Asthma Symptoms     . ATENOLOL 25 MG PO TABS Oral Take 25 mg by mouth daily.     Marland Kitchen PROMETHAZINE HCL 25 MG PO TABS Oral Take 25 mg by mouth every 6 (six) hours as needed. For nausea    . SUMATRIPTAN SUCCINATE 100 MG PO TABS Oral Take 100 mg by mouth every 2 (two) hours as needed. For migraine     . BUTALBITAL-APAP-CAFFEINE 50-325-40 MG PO TABS Oral Take 1 tablet by mouth 2 (two) times daily as needed. FOR MIGRAINE PAIN      BP 144/75  Pulse 84  Temp 98 F (36.7 C) (Oral)  Resp 22  Ht 5\' 3"  (1.6 m)  Wt 153 lb (69.4 kg)  BMI  27.10 kg/m2  SpO2 100%  LMP 09/15/2011  Physical Exam 1335: Physical examination:  Nursing notes reviewed; Vital signs and O2 SAT reviewed;  Constitutional: Well developed, Well nourished, Well hydrated, crying, retching, uncomfortable appearing; Head:  Normocephalic, atraumatic; Eyes: EOMI, PERRL, No scleral icterus; ENMT: Mouth and pharynx normal, Mucous membranes moist; Neck: Supple, Full range of motion, No lymphadenopathy; Cardiovascular: Regular rate and rhythm, No murmur, rub, or gallop; Respiratory: Breath sounds clear & equal bilaterally, No rales, rhonchi, wheezes.  Speaking full sentences with ease, Normal respiratory effort/excursion; Chest: Nontender, Movement normal;; Extremities: Pulses normal, +decreased F/E left knee due to pain.  Pt is able to  lift extended LLE against gravity and extend left lower leg against gravity.  No ligamentous laxity.  No patellar or quad tendon step-offs.  NMS intact left foot, strong pedal pp.  No proximal fibular head tenderness.  No edema, erythema, warmth, ecchymosis or deformity.  +generalized tenderness to palp without specific area of point tenderness. LE muscle compartments soft. +plantarflexion of left foot w/calf squeeze.  No palpable gap left Achilles's tendon.  No edema, No calf tenderness, edema or asymmetry.; Neuro: AA&Ox3, Major CN grossly intact.  Speech clear. No gross focal motor or sensory deficits in extremities.; Skin: Color normal, Warm, Dry.    ED Course  Procedures     MDM  MDM Reviewed: nursing note, vitals and previous chart Interpretation: x-ray, labs and ultrasound   Results for orders placed during the hospital encounter of 09/21/11  URINALYSIS, ROUTINE W REFLEX MICROSCOPIC      Component Value Range   Color, Urine YELLOW  YELLOW   APPearance CLEAR  CLEAR   Specific Gravity, Urine 1.025  1.005 - 1.030   pH 6.5  5.0 - 8.0   Glucose, UA NEGATIVE  NEGATIVE mg/dL   Hgb urine dipstick NEGATIVE  NEGATIVE   Bilirubin Urine NEGATIVE  NEGATIVE   Ketones, ur 15 (*) NEGATIVE mg/dL   Protein, ur NEGATIVE  NEGATIVE mg/dL   Urobilinogen, UA 1.0  0.0 - 1.0 mg/dL   Nitrite NEGATIVE  NEGATIVE   Leukocytes, UA NEGATIVE  NEGATIVE  PREGNANCY, URINE      Component Value Range   Preg Test, Ur NEGATIVE  NEGATIVE  CK      Component Value Range   Total CK 250 (*) 7 - 177 U/L   US Venous Img Lower Unilateral Left 09/21/2011  *RADIOLOGY REPORT*  Clinical Data: Left knee pain.  LEFT LOWER EXTREMITY VENOUS DUPLEX ULTRASOUND  Technique:  Gray-scale sonography with graded compression, as well as color Doppler and duplex ultrasound, were performed to evaluate the deep venous system of the lower extremity from the level of the common femoral vein through the popliteal and proximal calf veins.  Spectral Doppler was utilized to evaluate flow at rest and with distal augmentation maneuvers.  Comparison:  None.  Findings: From the level of the left common femoral vein to the left popliteal vein, there is adequate color flow, compression and augmentation without evidence of a left lower extremity deep venous thrombosis.  Visualized calf veins are patent.  IMPRESSION: No evidence of left lower extremity deep venous thrombosis.   Original Report Authenticated By: Fuller Canada, M.D.    Dg Knee Complete 4 Views Left 09/21/2011  *RADIOLOGY REPORT*  Clinical Data: Severe pain  LEFT KNEE - COMPLETE 4+ VIEW  Comparison: None.  Findings: No evidence for fracture or dislocation.  There is no joint effusion.  No worrisome lytic or sclerotic osseous lesion.  IMPRESSION: Normal exam.   Original Report Authenticated By: ERIC A. MANSELL, M.D.       [phone]:  Pt crying and thrashing about on the stretcher c/o "sharp pain" in her left knee which "goes up my body" and "is making me vomit."  Pt gagging and retching loudly.  Pt's symptoms improved after IM dilaudid, IM phenergan and IM ativan.  Has tol PO well without N/V and is ready to go home now.  No acute findings on workup or physical exam to suggest fracture, septic joint, or compartment syndrome.  Will tx knee pain symptomatically with knee immobilizer and crutches.  Dx testing d/w pt.  Questions answered.  Verb understanding, agreeable to d/c home with outpt f/u.        Laray Anger, DO 09/23/11 2009

## 2011-09-22 LAB — POCT I-STAT, CHEM 8
Creatinine, Ser: 0.7 mg/dL (ref 0.50–1.10)
Glucose, Bld: 106 mg/dL — ABNORMAL HIGH (ref 70–99)
HCT: 40 % (ref 36.0–46.0)
Hemoglobin: 13.6 g/dL (ref 12.0–15.0)
Potassium: 3.1 mEq/L — ABNORMAL LOW (ref 3.5–5.1)
Sodium: 143 mEq/L (ref 135–145)
TCO2: 22 mmol/L (ref 0–100)

## 2011-10-10 ENCOUNTER — Encounter (HOSPITAL_COMMUNITY): Payer: Self-pay | Admitting: Emergency Medicine

## 2011-10-10 ENCOUNTER — Emergency Department (HOSPITAL_COMMUNITY): Payer: Medicaid Other

## 2011-10-10 ENCOUNTER — Emergency Department (HOSPITAL_COMMUNITY)
Admission: EM | Admit: 2011-10-10 | Discharge: 2011-10-10 | Disposition: A | Discharge status: Home / Self Care | Payer: Medicaid Other | Attending: Emergency Medicine | Admitting: Emergency Medicine

## 2011-10-10 DIAGNOSIS — R1110 Vomiting, unspecified: Secondary | ICD-10-CM | POA: Insufficient documentation

## 2011-10-10 DIAGNOSIS — I10 Essential (primary) hypertension: Secondary | ICD-10-CM | POA: Insufficient documentation

## 2011-10-10 DIAGNOSIS — R109 Unspecified abdominal pain: Secondary | ICD-10-CM | POA: Insufficient documentation

## 2011-10-10 DIAGNOSIS — R10819 Abdominal tenderness, unspecified site: Secondary | ICD-10-CM | POA: Insufficient documentation

## 2011-10-10 LAB — URINALYSIS, ROUTINE W REFLEX MICROSCOPIC
Bilirubin Urine: NEGATIVE
Glucose, UA: NEGATIVE mg/dL
Ketones, ur: NEGATIVE mg/dL
Specific Gravity, Urine: >1.03 — ABNORMAL HIGH (ref 1.005–1.030)
pH: 6 (ref 5.0–8.0)

## 2011-10-10 LAB — COMPREHENSIVE METABOLIC PANEL
ALT: 7 U/L (ref 0–35)
AST: 13 U/L (ref 0–37)
Albumin: 3.8 g/dL (ref 3.5–5.2)
Alkaline Phosphatase: 38 U/L — ABNORMAL LOW (ref 39–117)
BUN: 9 mg/dL (ref 6–23)
Chloride: 106 mEq/L (ref 96–112)
Potassium: 3.7 mEq/L (ref 3.5–5.1)
Sodium: 138 mEq/L (ref 135–145)
Total Bilirubin: 0.4 mg/dL (ref 0.3–1.2)
Total Protein: 6.9 g/dL (ref 6.0–8.3)

## 2011-10-10 LAB — CBC WITH DIFFERENTIAL/PLATELET
Basophils Relative: 1 % (ref 0–1)
Eosinophils Absolute: 0.3 10*3/uL (ref 0.0–0.7)
Hemoglobin: 12.3 g/dL (ref 12.0–15.0)
MCHC: 36.1 g/dL — ABNORMAL HIGH (ref 30.0–36.0)
Monocytes Relative: 10 % (ref 3–12)
Neutro Abs: 2.7 10*3/uL (ref 1.7–7.7)
Neutrophils Relative %: 41 % — ABNORMAL LOW (ref 43–77)
Platelets: 244 10*3/uL (ref 150–400)
RBC: 4.21 MIL/uL (ref 3.87–5.11)

## 2011-10-10 LAB — URINE MICROSCOPIC-ADD ON

## 2011-10-10 LAB — LIPASE, BLOOD: Lipase: 44 U/L (ref 11–59)

## 2011-10-10 MED ORDER — OXYCODONE-ACETAMINOPHEN 5-325 MG PO TABS: 1.0000 | ORAL_TABLET | Freq: Four times a day (QID) | ORAL | Status: AC | PRN

## 2011-10-10 MED ORDER — GI COCKTAIL ~~LOC~~
30.0000 mL | Freq: Once | ORAL | Status: AC
  Administered 2011-10-10: 30 mL via ORAL

## 2011-10-10 MED ORDER — GI COCKTAIL ~~LOC~~
ORAL | Status: AC
  Administered 2011-10-10: 30 mL via ORAL
  Filled 2011-10-10: qty 30

## 2011-10-10 MED ORDER — ONDANSETRON HCL 4 MG/2ML IJ SOLN
4.0000 mg | Freq: Once | INTRAMUSCULAR | Status: AC
  Administered 2011-10-10: 4 mg via INTRAVENOUS
  Filled 2011-10-10: qty 2

## 2011-10-10 MED ORDER — MORPHINE SULFATE 4 MG/ML IJ SOLN
INTRAMUSCULAR | Status: AC
  Administered 2011-10-10: 6 mg via INTRAVENOUS
  Filled 2011-10-10: qty 1

## 2011-10-10 MED ORDER — MORPHINE SULFATE 4 MG/ML IJ SOLN
6.0000 mg | Freq: Once | INTRAMUSCULAR | Status: AC
  Administered 2011-10-10: 6 mg via INTRAVENOUS
  Filled 2011-10-10: qty 2

## 2011-10-10 MED ORDER — PANTOPRAZOLE SODIUM 20 MG PO TBEC: 20.0000 mg | DELAYED_RELEASE_TABLET | Freq: Every day | ORAL | Status: DC

## 2011-10-10 MED ORDER — MORPHINE SULFATE 4 MG/ML IJ SOLN
6.0000 mg | Freq: Once | INTRAMUSCULAR | Status: AC
  Administered 2011-10-10: 4 mg via INTRAVENOUS

## 2011-10-10 MED ORDER — DIPHENHYDRAMINE HCL 50 MG/ML IJ SOLN
25.0000 mg | Freq: Once | INTRAMUSCULAR | Status: AC
  Administered 2011-10-10: 25 mg via INTRAVENOUS

## 2011-10-10 MED ORDER — MORPHINE SULFATE 2 MG/ML IJ SOLN
INTRAMUSCULAR | Status: AC
  Administered 2011-10-10: 2 mg via INTRAVASCULAR
  Filled 2011-10-10: qty 1

## 2011-10-10 MED ORDER — LORAZEPAM 2 MG/ML IJ SOLN
1.0000 mg | Freq: Once | INTRAMUSCULAR | Status: AC
  Administered 2011-10-10: 1 mg via INTRAVENOUS

## 2011-10-10 MED ORDER — HYDROMORPHONE HCL PF 1 MG/ML IJ SOLN
1.0000 mg | Freq: Once | INTRAMUSCULAR | Status: AC
  Administered 2011-10-10: 1 mg via INTRAVENOUS
  Filled 2011-10-10: qty 1

## 2011-10-10 MED ORDER — LORAZEPAM 2 MG/ML IJ SOLN
INTRAMUSCULAR | Status: AC
  Administered 2011-10-10: 1 mg via INTRAVENOUS
  Filled 2011-10-10: qty 1

## 2011-10-10 MED ORDER — DIPHENHYDRAMINE HCL 50 MG/ML IJ SOLN
INTRAMUSCULAR | Status: AC
  Administered 2011-10-10: 25 mg via INTRAVENOUS
  Filled 2011-10-10: qty 1

## 2011-10-10 MED ORDER — PANTOPRAZOLE SODIUM 40 MG IV SOLR
40.0000 mg | Freq: Once | INTRAVENOUS | Status: AC
  Administered 2011-10-10: 40 mg via INTRAVENOUS
  Filled 2011-10-10: qty 40

## 2011-10-10 NOTE — ED Notes (Signed)
Patient with no complaints at this time. Respirations even and unlabored. Skin warm/dry. Discharge instructions reviewed with patient at this time. Patient given opportunity to voice concerns/ask questions. IV removed per policy and band-aid applied to site. Patient discharged at this time and left Emergency Department with steady gait.  

## 2011-10-10 NOTE — ED Provider Notes (Signed)
History   This chart was scribed for Tanya Lennert, MD by Gerlean Ren. This patient was seen in room [facility] and the patient's care was started at 7:29AM.   CSN: [id number]  Arrival date & time 10/10/11  1191   First MD Initiated Contact with Patient 10/10/11 [phone]      Chief Complaint  Patient presents with  . Abdominal Pain    (Consider location/radiation/quality/duration/timing/severity/associated sxs/prior treatment) Patient is a 26 y.o. female presenting with abdominal pain. The history is provided by the patient. No language interpreter was used.  Abdominal Pain The primary symptoms of the illness include abdominal pain. The primary symptoms of the illness do not include fatigue or diarrhea. The current episode started more than 2 days ago. The onset of the illness was gradual. The problem has been gradually worsening.  Associated with: nothing. The patient states that she believes she is currently not pregnant. Symptoms associated with the illness do not include urgency, hematuria or frequency.   Tanya Krause is a 26 y.o. female who presents to the Emergency Department complaining of severe, burning and throbbing abdominal pain beginning 4 days ago that began worsening and radiating to right flank this morning.  Pt reports emesis beginning this morning when she eats and drinks.  Pt denies dysuria or any associated urinary or bowel symptoms.  Pt has had tubal ligation.  Pt has h/o HTN.  Pt is a current everyday smoker and reports occasional alcohol use.    Past Medical History  Diagnosis Date  . Hypertension   . Migraine   . Asthma   . Pelvic inflammatory disease (PID)     Past Surgical History  Procedure Date  . Tubal ligation     Family History  Problem Relation Age of Onset  . Hypertension Mother   . Diabetes Mother   . Heart failure Father     History  Substance Use Topics  . Smoking status: Current Some Day Smoker    Types: Cigarettes  . Smokeless  tobacco: Never Used  . Alcohol Use: Yes     occasionally-once a month    OB History    Grav Para Term Preterm Abortions TAB SAB Ect Mult Living   4 4 4       4       Review of Systems  Constitutional: Negative for fatigue.  HENT: Negative for congestion, sinus pressure and ear discharge.   Eyes: Negative for discharge.  Respiratory: Negative for cough.   Cardiovascular: Negative for chest pain.  Gastrointestinal: Positive for abdominal pain. Negative for diarrhea.  Genitourinary: Negative for urgency, frequency and hematuria.  Musculoskeletal: Negative for myalgias.  Skin: Negative for rash.  Neurological: Negative for seizures and headaches.  Hematological: Negative.   Psychiatric/Behavioral: Negative for hallucinations.    Allergies  Aspirin; Ibuprofen; Other; Shellfish allergy; and Latex  Home Medications   Current Outpatient Rx  Name Route Sig Dispense Refill  . ACETAMINOPHEN 500 MG PO TABS Oral Take 500-1,000 mg by mouth every 6 (six) hours as needed. Pain    . ALBUTEROL SULFATE HFA 108 (90 BASE) MCG/ACT IN AERS Inhalation Inhale 2 puffs into the lungs every 6 (six) hours as needed. Asthma Symptoms     . ATENOLOL 25 MG PO TABS Oral Take 25 mg by mouth daily.     Marland Kitchen BUTALBITAL-APAP-CAFFEINE 50-325-40 MG PO TABS Oral Take 1 tablet by mouth 2 (two) times daily as needed. FOR MIGRAINE PAIN    . PROMETHAZINE HCL 25 MG  PO TABS Oral Take 25 mg by mouth every 6 (six) hours as needed. For nausea    . SUMATRIPTAN SUCCINATE 100 MG PO TABS Oral Take 100 mg by mouth every 2 (two) hours as needed. For migraine       BP 126/85  Pulse 74  Temp 98.1 F (36.7 C)  Resp 18  Wt 150 lb (68.04 kg)  SpO2 100%  LMP 10/07/2011  Physical Exam  Nursing note and vitals reviewed. Constitutional: She is oriented to person, place, and time. She appears well-developed.  HENT:  Head: Normocephalic and atraumatic.  Eyes: Conjunctivae normal and EOM are normal. No scleral icterus.  Neck: Neck  supple. No thyromegaly present.  Cardiovascular: Normal rate, regular rhythm and normal heart sounds.  Exam reveals no gallop and no friction rub.   No murmur heard. Pulmonary/Chest: Effort normal and breath sounds normal. No stridor. She has no wheezes. She has no rales. She exhibits no tenderness.  Abdominal: Soft. She exhibits no distension. There is tenderness (RUQ and right flank tenderness). There is no rebound.  Musculoskeletal: Normal range of motion. She exhibits no edema.  Lymphadenopathy:    She has no cervical adenopathy.  Neurological: She is oriented to person, place, and time. Coordination normal.  Skin: No rash noted. No erythema.  Psychiatric: She has a normal mood and affect. Her behavior is normal.    ED Course  Procedures (including critical care time) DIAGNOSTIC STUDIES: Oxygen Saturation is 100% on room air, normal by my interpretation.    COORDINATION OF CARE: 7:31AM- Ordered dilaudid and zofran.  Ordered lipase, urinalysis, pregnancy test, CBC, and c-met. 9:33AM- Re-check: pt still in pain with no improvement.  11:00AM- Re-check: discussed discharge with pain and nausea meds.   Labs Reviewed - No data to display No results found. Results for orders placed during the hospital encounter of 10/10/11  CBC WITH DIFFERENTIAL      Component Value Range   WBC 6.8  4.0 - 10.5 K/uL   RBC 4.21  3.87 - 5.11 MIL/uL   Hemoglobin 12.3  12.0 - 15.0 g/dL   HCT 16.1 (*) 09.6 - 04.5 %   MCV 81.0  78.0 - 100.0 fL   MCH 29.2  26.0 - 34.0 pg   MCHC 36.1 (*) 30.0 - 36.0 g/dL   RDW 40.9  81.1 - 91.4 %   Platelets 244  150 - 400 K/uL   Neutrophils Relative 41 (*) 43 - 77 %   Neutro Abs 2.7  1.7 - 7.7 K/uL   Lymphocytes Relative 45  12 - 46 %   Lymphs Abs 3.0  0.7 - 4.0 K/uL   Monocytes Relative 10  3 - 12 %   Monocytes Absolute 0.7  0.1 - 1.0 K/uL   Eosinophils Relative 4  0 - 5 %   Eosinophils Absolute 0.3  0.0 - 0.7 K/uL   Basophils Relative 1  0 - 1 %   Basophils  Absolute 0.1  0.0 - 0.1 K/uL  COMPREHENSIVE METABOLIC PANEL      Component Value Range   Sodium 138  135 - 145 mEq/L   Potassium 3.7  3.5 - 5.1 mEq/L   Chloride 106  96 - 112 mEq/L   CO2 23  19 - 32 mEq/L   Glucose, Bld 81  70 - 99 mg/dL   BUN 9  6 - 23 mg/dL   Creatinine, Ser 7.82  0.50 - 1.10 mg/dL   Calcium 9.3  8.4 - 95.6 mg/dL  Total Protein 6.9  6.0 - 8.3 g/dL   Albumin 3.8  3.5 - 5.2 g/dL   AST 13  0 - 37 U/L   ALT 7  0 - 35 U/L   Alkaline Phosphatase 38 (*) 39 - 117 U/L   Total Bilirubin 0.4  0.3 - 1.2 mg/dL   GFR calc non Af Amer >90  >90 mL/min   GFR calc Af Amer >90  >90 mL/min  LIPASE, BLOOD      Component Value Range   Lipase 44  11 - 59 U/L  URINALYSIS, ROUTINE W REFLEX MICROSCOPIC      Component Value Range   Color, Urine YELLOW  YELLOW   APPearance CLEAR  CLEAR   Specific Gravity, Urine >1.030 (*) 1.005 - 1.030   pH 6.0  5.0 - 8.0   Glucose, UA NEGATIVE  NEGATIVE mg/dL   Hgb urine dipstick SMALL (*) NEGATIVE   Bilirubin Urine NEGATIVE  NEGATIVE   Ketones, ur NEGATIVE  NEGATIVE mg/dL   Protein, ur NEGATIVE  NEGATIVE mg/dL   Urobilinogen, UA 0.2  0.0 - 1.0 mg/dL   Nitrite NEGATIVE  NEGATIVE   Leukocytes, UA NEGATIVE  NEGATIVE  PREGNANCY, URINE      Component Value Range   Preg Test, Ur NEGATIVE  NEGATIVE  URINE MICROSCOPIC-ADD ON      Component Value Range   Squamous Epithelial / LPF RARE  RARE   RBC / HPF 3-6  <3 RBC/hpf    Dg Abd Acute W/chest  10/10/2011  *RADIOLOGY REPORT*  Clinical Data: Abdominal pain.  ACUTE ABDOMEN SERIES (ABDOMEN 2 VIEW & CHEST 1 VIEW)  Comparison: 02/14/2011  Findings: The bowel gas pattern is normal.  There is no evidence of free intraperitoneal air.  No suspicious radio-opaque calculi or other significant radiographic abnormality is seen. Heart size and mediastinal contours are within normal limits.  Both lungs are clear.  IMPRESSION: No acute findings.   Original Report Authenticated By: Cyndie Chime, M.D.     No  diagnosis found.    MDM   The chart was scribed for me under my direct supervision.  I personally performed the history, physical, and medical decision making and all procedures in the evaluation of this patient.Tanya Lennert, MD 10/10/11 [phone]

## 2011-10-10 NOTE — ED Notes (Signed)
Pt c/o abd pain/swelling x 4 days with n/v since last night.

## 2011-10-28 ENCOUNTER — Emergency Department (HOSPITAL_COMMUNITY): Payer: Medicaid Other

## 2011-10-28 ENCOUNTER — Encounter (HOSPITAL_COMMUNITY): Payer: Self-pay | Admitting: *Deleted

## 2011-10-28 ENCOUNTER — Emergency Department (HOSPITAL_COMMUNITY)
Admission: EM | Admit: 2011-10-28 | Discharge: 2011-10-29 | Disposition: A | Discharge status: Home / Self Care | Payer: Medicaid Other | Attending: Emergency Medicine | Admitting: Emergency Medicine

## 2011-10-28 DIAGNOSIS — R634 Abnormal weight loss: Secondary | ICD-10-CM | POA: Insufficient documentation

## 2011-10-28 DIAGNOSIS — I10 Essential (primary) hypertension: Secondary | ICD-10-CM | POA: Insufficient documentation

## 2011-10-28 DIAGNOSIS — R630 Anorexia: Secondary | ICD-10-CM | POA: Insufficient documentation

## 2011-10-28 DIAGNOSIS — Z79899 Other long term (current) drug therapy: Secondary | ICD-10-CM | POA: Insufficient documentation

## 2011-10-28 DIAGNOSIS — R109 Unspecified abdominal pain: Secondary | ICD-10-CM | POA: Insufficient documentation

## 2011-10-28 DIAGNOSIS — M549 Dorsalgia, unspecified: Secondary | ICD-10-CM | POA: Insufficient documentation

## 2011-10-28 DIAGNOSIS — J45909 Unspecified asthma, uncomplicated: Secondary | ICD-10-CM | POA: Insufficient documentation

## 2011-10-28 DIAGNOSIS — R112 Nausea with vomiting, unspecified: Secondary | ICD-10-CM | POA: Insufficient documentation

## 2011-10-28 DIAGNOSIS — R319 Hematuria, unspecified: Secondary | ICD-10-CM | POA: Insufficient documentation

## 2011-10-28 DIAGNOSIS — R197 Diarrhea, unspecified: Secondary | ICD-10-CM | POA: Insufficient documentation

## 2011-10-28 LAB — CBC WITH DIFFERENTIAL/PLATELET
Basophils Absolute: 0 10*3/uL (ref 0.0–0.1)
Basophils Relative: 1 % (ref 0–1)
HCT: 34.7 % — ABNORMAL LOW (ref 36.0–46.0)
Hemoglobin: 12.4 g/dL (ref 12.0–15.0)
Lymphocytes Relative: 49 % — ABNORMAL HIGH (ref 12–46)
MCHC: 35.7 g/dL (ref 30.0–36.0)
Monocytes Absolute: 0.7 10*3/uL (ref 0.1–1.0)
Monocytes Relative: 10 % (ref 3–12)
Neutro Abs: 2.8 10*3/uL (ref 1.7–7.7)
Neutrophils Relative %: 38 % — ABNORMAL LOW (ref 43–77)
WBC: 7.5 10*3/uL (ref 4.0–10.5)

## 2011-10-28 LAB — RAPID URINE DRUG SCREEN, HOSP PERFORMED
Cocaine: NOT DETECTED
Opiates: POSITIVE — AB

## 2011-10-28 LAB — COMPREHENSIVE METABOLIC PANEL
AST: 17 U/L (ref 0–37)
Albumin: 4 g/dL (ref 3.5–5.2)
Alkaline Phosphatase: 37 U/L — ABNORMAL LOW (ref 39–117)
BUN: 7 mg/dL (ref 6–23)
CO2: 28 mEq/L (ref 19–32)
Chloride: 102 mEq/L (ref 96–112)
Creatinine, Ser: 0.75 mg/dL (ref 0.50–1.10)
GFR calc non Af Amer: >90 mL/min (ref >90)
Potassium: 3.5 mEq/L (ref 3.5–5.1)
Total Bilirubin: 0.4 mg/dL (ref 0.3–1.2)

## 2011-10-28 LAB — URINALYSIS, ROUTINE W REFLEX MICROSCOPIC
Glucose, UA: NEGATIVE mg/dL
pH: 7 (ref 5.0–8.0)

## 2011-10-28 LAB — URINE MICROSCOPIC-ADD ON

## 2011-10-28 MED ORDER — OXYCODONE-ACETAMINOPHEN 5-325 MG PO TABS
2.0000 | ORAL_TABLET | Freq: Once | ORAL | Status: AC
  Administered 2011-10-28: 2 via ORAL
  Filled 2011-10-28: qty 2

## 2011-10-28 MED ORDER — SODIUM CHLORIDE 0.9 % IV SOLN: 1000.0000 mL | INTRAVENOUS | Status: DC

## 2011-10-28 MED ORDER — DIPHENHYDRAMINE HCL 50 MG/ML IJ SOLN
50.0000 mg | Freq: Once | INTRAMUSCULAR | Status: AC
  Administered 2011-10-28: 50 mg via INTRAVENOUS
  Filled 2011-10-28: qty 1

## 2011-10-28 MED ORDER — METOCLOPRAMIDE HCL 5 MG/ML IJ SOLN
10.0000 mg | Freq: Once | INTRAMUSCULAR | Status: AC
  Administered 2011-10-28: 10 mg via INTRAVENOUS
  Filled 2011-10-28: qty 2

## 2011-10-28 MED ORDER — SODIUM CHLORIDE 0.9 % IV SOLN
1000.0000 mL | Freq: Once | INTRAVENOUS | Status: AC
  Administered 2011-10-28: 1000 mL via INTRAVENOUS

## 2011-10-28 NOTE — ED Notes (Signed)
abd pain, hematuria,Nausea, vomiting, no diarrhea, Headache.

## 2011-10-28 NOTE — ED Notes (Addendum)
Pt presents with abdominal pain x 2 months, worsening over past 3 days. Pt states has vaginal bleeding again, "period stopped 2 weeks ago and started again".  Pain all over her body, and "some N/v.  NAD noted. Pt states is using 5-6pads a day to manage bleeding.  NAD noted.

## 2011-10-28 NOTE — ED Provider Notes (Signed)
History  This chart was scribed for Ward Givens, MD by Erskine Emery. This patient was seen in room APA06/APA06 and the patient's care was started at 21:01.   CSN: [id number]  Arrival date & time 10/28/11  1818   First MD Initiated Contact with Patient 10/28/11 2101      Chief Complaint  Patient presents with  . Abdominal Pain    (Consider location/radiation/quality/duration/timing/severity/associated sxs/prior treatment) The history is provided by the patient. No language interpreter was used.  Tanya Krause is a 26 y.o. female who presents to the Emergency Department complaining of gradually worsening abdominal pain, right flank pain that radiates to the back, and emesis (2 episodes today), for the past several weeks. Pt report she can't keep anything down and has experienced significant weight loss. Pt also presents will hematuria,emergency and blood when wiping but denies any diarrhea. She also denies dysuria. She states her last period ended a week ago. She reports she has lost about 14 poundsmainly because she is unable to keep. Pt is on protonics which do nothing to relieve the symptoms. Pt also uses a heating pad and tries to sit still, which temorarily relieves the pain. Pt reports her symptoms are aggravated by laying on her right side or changing positions. Pt reports she was here 2-3 weeks ago for similar symptoms but she reports this pain is unlike previous episodes, she has never had the pain under her ribs like this. Pt's LNMP ended last Sunday.  The [facility] health dept is the pt's PCP, she has no GI doctor.  Past Medical History  Diagnosis Date  . Hypertension   . Migraine   . Asthma   . Pelvic inflammatory disease (PID)     Past Surgical History  Procedure Date  . Tubal ligation   . Kidney infections     Family History  Problem Relation Age of Onset  . Hypertension Mother   . Diabetes Mother   . Heart failure Father     History  Substance Use Topics    . Smoking status: Current Some Day Smoker    Types: Cigarettes  . Smokeless tobacco: Never Used  . Alcohol Use: Yes     occasionally-once a month   Pt reports she hasn't been smoking since this abdominal pain started; she had one puff 3 days ago but that's it. Pt reports she drinks occasionally but not since this abdominal pain.  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  OB History    Grav Para Term Preterm Abortions TAB SAB Ect Mult Living   4 4 4       4       Review of Systems  Constitutional: Positive for appetite change and unexpected weight change.  Gastrointestinal: Positive for nausea, vomiting, abdominal pain and diarrhea.  Genitourinary: Positive for hematuria and flank pain.  Musculoskeletal: Positive for back pain.  All other systems reviewed and are negative.    Allergies  Aspirin; Hydromorphone hcl; Ibuprofen; Other; Shellfish allergy; and Latex  Home Medications   Current Outpatient Rx  Name Route Sig Dispense Refill  . ACETAMINOPHEN 500 MG PO TABS Oral Take 500-1,000 mg by mouth every 6 (six) hours as needed. Pain    . AMITRIPTYLINE HCL 50 MG PO TABS Oral Take 50 mg by mouth at bedtime.    . ATENOLOL 25 MG PO TABS Oral Take 25 mg by mouth daily.     Marland Kitchen  BUTALBITAL-APAP-CAFFEINE 50-325-40 MG PO TABS Oral Take 1 tablet by mouth 2 (two) times daily as needed. FOR MIGRAINE PAIN    . HYDROCODONE-ACETAMINOPHEN 5-325 MG PO TABS Oral Take 1 tablet by mouth once as needed. For pain    . PANTOPRAZOLE SODIUM 20 MG PO TBEC Oral Take 1 tablet (20 mg total) by mouth daily. 30 tablet 1  . PROMETHAZINE HCL 25 MG PO TABS Oral Take 25 mg by mouth every 6 (six) hours as needed. For nausea    . SUMATRIPTAN SUCCINATE 100 MG PO TABS Oral Take 100 mg by mouth every 2 (two) hours as needed. For migraine     . ZOLMITRIPTAN 5 MG PO TABS Oral Take 5 mg by mouth as needed. Migraines, may take every 2 hours if needed, not exceeding 2 in a  24 hour period    . ALBUTEROL SULFATE HFA 108 (90 BASE) MCG/ACT IN AERS Inhalation Inhale 2 puffs into the lungs every 6 (six) hours as needed. Asthma Symptoms       Triage Vitals: BP 133/79  Pulse 66  Temp 98.4 F (36.9 C) (Oral)  Resp 18  Ht 5' (1.524 m)  Wt 123 lb (55.792 kg)  BMI 24.02 kg/m2  SpO2 100%  LMP 10/22/2011  Vital signs normal    Physical Exam  Nursing note and vitals reviewed. Constitutional: She is oriented to person, place, and time. She appears well-developed and well-nourished.  Non-toxic appearance. She does not appear ill. No distress.  HENT:  Head: Normocephalic and atraumatic.  Right Ear: External ear normal.  Left Ear: External ear normal.  Nose: Nose normal. No mucosal edema or rhinorrhea.  Mouth/Throat: Mucous membranes are normal. No dental abscesses or uvula swelling.       Tongue is dry.  Eyes: Conjunctivae normal and EOM are normal. Pupils are equal, round, and reactive to light.  Neck: Normal range of motion and full passive range of motion without pain. Neck supple.  Cardiovascular: Normal rate, regular rhythm and normal heart sounds.  Exam reveals no gallop and no friction rub.   No murmur heard. Pulmonary/Chest: Effort normal and breath sounds normal. No respiratory distress. She has no wheezes. She has no rhonchi. She has no rales. She exhibits no tenderness and no crepitus.  Abdominal: Soft. Normal appearance and bowel sounds are normal. She exhibits no distension. There is tenderness. There is no rebound and no guarding.       Diffusely tender, states more tender on the right.  Musculoskeletal: Normal range of motion. She exhibits no edema and no tenderness.       Moves all extremities well.   Neurological: She is alert and oriented to person, place, and time. She has normal strength. No cranial nerve deficit.  Skin: Skin is warm, dry and intact. No rash noted. No erythema. No pallor.  Psychiatric: She has a normal mood and affect. Her  speech is normal and behavior is normal. Her mood appears not anxious.    ED Course  Procedures (including critical care time) DIAGNOSTIC STUDIES: Oxygen Saturation is 100% on room air, normal by my interpretation.    COORDINATION OF CARE: 21:22--I evaluated the patient and we discussed a treatment plan including blood work, urinalysis, and nausea medication to which the pt agreed.   21:25--Review of pt's records show she had an abdominal CT in January 2013, June 2012, May 2012, and several before that--all normal.  Patient seen September 4 for "knee pain "with normal x-ray and Doppler ultrasound. The  ER physician documents patient writhing in uncooperative until she got a dilaudid injection. Patient also seen September 23 for abdominal pain, she was getting multiple doses of morphine and finally got dilaudid is in her pain resolved.   Results for orders placed during the hospital encounter of 10/28/11  CBC WITH DIFFERENTIAL      Component Value Range   WBC 7.5  4.0 - 10.5 K/uL   RBC 4.27  3.87 - 5.11 MIL/uL   Hemoglobin 12.4  12.0 - 15.0 g/dL   HCT 63.8 (*) 75.6 - 43.3 %   MCV 81.3  78.0 - 100.0 fL   MCH 29.0  26.0 - 34.0 pg   MCHC 35.7  30.0 - 36.0 g/dL   RDW 29.5  18.8 - 41.6 %   Platelets 228  150 - 400 K/uL   Neutrophils Relative 38 (*) 43 - 77 %   Neutro Abs 2.8  1.7 - 7.7 K/uL   Lymphocytes Relative 49 (*) 12 - 46 %   Lymphs Abs 3.7  0.7 - 4.0 K/uL   Monocytes Relative 10  3 - 12 %   Monocytes Absolute 0.7  0.1 - 1.0 K/uL   Eosinophils Relative 3  0 - 5 %   Eosinophils Absolute 0.2  0.0 - 0.7 K/uL   Basophils Relative 1  0 - 1 %   Basophils Absolute 0.0  0.0 - 0.1 K/uL  COMPREHENSIVE METABOLIC PANEL      Component Value Range   Sodium 138  135 - 145 mEq/L   Potassium 3.5  3.5 - 5.1 mEq/L   Chloride 102  96 - 112 mEq/L   CO2 28  19 - 32 mEq/L   Glucose, Bld 87  70 - 99 mg/dL   BUN 7  6 - 23 mg/dL   Creatinine, Ser 6.06  0.50 - 1.10 mg/dL   Calcium 9.6  8.4 - 30.1  mg/dL   Total Protein 7.3  6.0 - 8.3 g/dL   Albumin 4.0  3.5 - 5.2 g/dL   AST 17  0 - 37 U/L   ALT 10  0 - 35 U/L   Alkaline Phosphatase 37 (*) 39 - 117 U/L   Total Bilirubin 0.4  0.3 - 1.2 mg/dL   GFR calc non Af Amer >90  >90 mL/min   GFR calc Af Amer >90  >90 mL/min  LIPASE, BLOOD      Component Value Range   Lipase 27  11 - 59 U/L  URINALYSIS, ROUTINE W REFLEX MICROSCOPIC      Component Value Range   Color, Urine YELLOW  YELLOW   APPearance CLEAR  CLEAR   Specific Gravity, Urine 1.025  1.005 - 1.030   pH 7.0  5.0 - 8.0   Glucose, UA NEGATIVE  NEGATIVE mg/dL   Hgb urine dipstick LARGE (*) NEGATIVE   Bilirubin Urine SMALL (*) NEGATIVE   Ketones, ur TRACE (*) NEGATIVE mg/dL   Protein, ur TRACE (*) NEGATIVE mg/dL   Urobilinogen, UA 1.0  0.0 - 1.0 mg/dL   Nitrite NEGATIVE  NEGATIVE   Leukocytes, UA NEGATIVE  NEGATIVE  PREGNANCY, URINE      Component Value Range   Preg Test, Ur NEGATIVE  NEGATIVE  URINE MICROSCOPIC-ADD ON      Component Value Range   Squamous Epithelial / LPF FEW (*) RARE   WBC, UA 0-2  <3 WBC/hpf   RBC / HPF 7-10  <3 RBC/hpf   Bacteria, UA FEW (*) RARE  Urine-Other MUCOUS PRESENT    URINE RAPID DRUG SCREEN (HOSP PERFORMED)      Component Value Range   Opiates POSITIVE (*) NONE DETECTED   Cocaine NONE DETECTED  NONE DETECTED   Benzodiazepines NONE DETECTED  NONE DETECTED   Amphetamines NONE DETECTED  NONE DETECTED   Tetrahydrocannabinol POSITIVE (*) NONE DETECTED   Barbiturates POSITIVE (*) NONE DETECTED   Laboratory interpretation all normal except mild hematuria, positive drug screen   Ct Abdomen Pelvis Wo Contrast  10/29/2011  *RADIOLOGY REPORT*  Clinical Data: Right flank pain, hematuria  CT ABDOMEN AND PELVIS WITHOUT CONTRAST  Technique:  Multidetector CT imaging of the abdomen and pelvis was performed following the standard protocol without intravenous contrast.  Comparison: Multiple prior CTs, most recently 02/14/2011.  Findings: Lung bases are  clear.  Unenhanced liver, spleen, pancreas, and adrenal glands are within normal limits.  Gallbladder is underdistended.  No intrahepatic or extrahepatic ductal dilatation.  Kidneys are within normal limits.  No renal calculi or hydronephrosis.  No evidence of bowel obstruction.  Normal appendix.  No evidence of abdominal aortic aneurysm.  No abdominopelvic ascites.  No suspicious abdominopelvic lymphadenopathy.  Uterus and bilateral ovaries are unremarkable.  No ureteral or bladder calculi.  Bladder is underdistended.  Visualized osseous structures are within normal limits.  IMPRESSION: No renal, ureteral, or bladder calculi.  No hydronephrosis.  Unremarkable CT abdomen pelvis.  No interval change.   Original Report Authenticated By: Charline Bills, M.D.    Dg Abd Acute W/chest  10/10/2011  *RADIOLOGY REPORT*  Clinical Data: Abdominal pain.  ACUTE ABDOMEN SERIES (ABDOMEN 2 VIEW & CHEST 1 VIEW)  Comparison: 02/14/2011  Findings: The bowel gas pattern is normal.  There is no evidence of free intraperitoneal air.  No suspicious radio-opaque calculi or other significant radiographic abnormality is seen. Heart size and mediastinal contours are within normal limits.  Both lungs are clear.  IMPRESSION: No acute findings.   Original Report Authenticated By: Cyndie Chime, M.D.       1. Abdominal pain   2. Hematuria    New Prescriptions   CEPHALEXIN (KEFLEX) 500 MG CAPSULE    Take 1 capsule (500 mg total) by mouth 3 (three) times daily.   PHENAZOPYRIDINE (PYRIDIUM) 200 MG TABLET    Take 1 tablet (200 mg total) by mouth 3 (three) times daily as needed (pain on urination).   PROMETHAZINE (PHENERGAN) 25 MG SUPPOSITORY    Place 1 suppository (25 mg total) rectally every 6 (six) hours as needed for nausea.   TRAMADOL-ACETAMINOPHEN (ULTRACET) 37.5-325 MG PER TABLET    2 tabs po QID prn pain    Plan discharge  Devoria Albe, MD, FACEP    MDM   I personally performed the services described in this  documentation, which was scribed in my presence. The recorded information has been reviewed and considered.  Devoria Albe, MD, Armando Gang       Ward Givens, MD 10/29/11 [phone]

## 2011-10-29 MED ORDER — PROMETHAZINE HCL 25 MG RE SUPP: 25.0000 mg | Freq: Four times a day (QID) | RECTAL | Status: DC | PRN

## 2011-10-29 MED ORDER — CEPHALEXIN 500 MG PO CAPS: 500.0000 mg | ORAL_CAPSULE | Freq: Three times a day (TID) | ORAL | Status: DC

## 2011-10-29 MED ORDER — PHENAZOPYRIDINE HCL 200 MG PO TABS: 200.0000 mg | ORAL_TABLET | Freq: Three times a day (TID) | ORAL | Status: DC | PRN

## 2011-10-29 MED ORDER — TRAMADOL-ACETAMINOPHEN 37.5-325 MG PO TABS: ORAL_TABLET | ORAL | Status: DC

## 2011-10-29 NOTE — ED Notes (Signed)
Pt alert & oriented x4, stable gait. Patient given discharge instructions, paperwork & prescription(s). Patient  instructed to stop at the registration desk to finish any additional paperwork. Patient verbalized understanding. Pt left department w/ no further questions. 

## 2011-12-30 ENCOUNTER — Emergency Department (HOSPITAL_COMMUNITY): Payer: Medicaid Other

## 2011-12-30 ENCOUNTER — Emergency Department (HOSPITAL_COMMUNITY)
Admission: EM | Admit: 2011-12-30 | Discharge: 2011-12-30 | Disposition: A | Discharge status: Home / Self Care | Payer: Medicaid Other | Attending: Emergency Medicine | Admitting: Emergency Medicine

## 2011-12-30 ENCOUNTER — Encounter (HOSPITAL_COMMUNITY): Payer: Self-pay | Admitting: *Deleted

## 2011-12-30 DIAGNOSIS — R109 Unspecified abdominal pain: Secondary | ICD-10-CM | POA: Insufficient documentation

## 2011-12-30 DIAGNOSIS — G43909 Migraine, unspecified, not intractable, without status migrainosus: Secondary | ICD-10-CM | POA: Insufficient documentation

## 2011-12-30 DIAGNOSIS — F17200 Nicotine dependence, unspecified, uncomplicated: Secondary | ICD-10-CM | POA: Insufficient documentation

## 2011-12-30 DIAGNOSIS — R300 Dysuria: Secondary | ICD-10-CM | POA: Insufficient documentation

## 2011-12-30 DIAGNOSIS — I10 Essential (primary) hypertension: Secondary | ICD-10-CM | POA: Insufficient documentation

## 2011-12-30 DIAGNOSIS — Z79899 Other long term (current) drug therapy: Secondary | ICD-10-CM | POA: Insufficient documentation

## 2011-12-30 DIAGNOSIS — J45909 Unspecified asthma, uncomplicated: Secondary | ICD-10-CM | POA: Insufficient documentation

## 2011-12-30 DIAGNOSIS — R112 Nausea with vomiting, unspecified: Secondary | ICD-10-CM | POA: Insufficient documentation

## 2011-12-30 DIAGNOSIS — Z8742 Personal history of other diseases of the female genital tract: Secondary | ICD-10-CM | POA: Insufficient documentation

## 2011-12-30 DIAGNOSIS — M549 Dorsalgia, unspecified: Secondary | ICD-10-CM | POA: Insufficient documentation

## 2011-12-30 LAB — CBC WITH DIFFERENTIAL/PLATELET
Basophils Absolute: 0.1 10*3/uL (ref 0.0–0.1)
Basophils Relative: 1 % (ref 0–1)
Eosinophils Relative: 4 % (ref 0–5)
HCT: 34.7 % — ABNORMAL LOW (ref 36.0–46.0)
MCHC: 35.2 g/dL (ref 30.0–36.0)
MCV: 81.1 fL (ref 78.0–100.0)
Monocytes Absolute: 0.8 10*3/uL (ref 0.1–1.0)
RDW: 14 % (ref 11.5–15.5)

## 2011-12-30 LAB — COMPREHENSIVE METABOLIC PANEL
AST: 14 U/L (ref 0–37)
Albumin: 3.6 g/dL (ref 3.5–5.2)
Calcium: 9.1 mg/dL (ref 8.4–10.5)
Creatinine, Ser: 0.65 mg/dL (ref 0.50–1.10)
Total Protein: 6.9 g/dL (ref 6.0–8.3)

## 2011-12-30 LAB — PREGNANCY, URINE: Preg Test, Ur: NEGATIVE

## 2011-12-30 LAB — URINALYSIS, ROUTINE W REFLEX MICROSCOPIC
Glucose, UA: NEGATIVE mg/dL
Hgb urine dipstick: NEGATIVE
Specific Gravity, Urine: 1.025 (ref 1.005–1.030)
Urobilinogen, UA: 0.2 mg/dL (ref 0.0–1.0)

## 2011-12-30 MED ORDER — HYDROCODONE-ACETAMINOPHEN 5-325 MG PO TABS: 1.0000 | ORAL_TABLET | Freq: Four times a day (QID) | ORAL | Status: DC | PRN

## 2011-12-30 MED ORDER — MORPHINE SULFATE 4 MG/ML IJ SOLN
4.0000 mg | Freq: Once | INTRAMUSCULAR | Status: AC
  Administered 2011-12-30: 4 mg via INTRAVENOUS
  Filled 2011-12-30: qty 1

## 2011-12-30 MED ORDER — MORPHINE SULFATE 4 MG/ML IJ SOLN
6.0000 mg | Freq: Once | INTRAMUSCULAR | Status: AC
  Administered 2011-12-30: 6 mg via INTRAVENOUS
  Filled 2011-12-30: qty 2

## 2011-12-30 MED ORDER — SODIUM CHLORIDE 0.9 % IV BOLUS (SEPSIS)
1000.0000 mL | Freq: Once | INTRAVENOUS | Status: AC
  Administered 2011-12-30: 1000 mL via INTRAVENOUS

## 2011-12-30 MED ORDER — OXYCODONE-ACETAMINOPHEN 5-325 MG PO TABS
1.0000 | ORAL_TABLET | Freq: Once | ORAL | Status: AC
  Administered 2011-12-30: 1 via ORAL
  Filled 2011-12-30: qty 1

## 2011-12-30 MED ORDER — PROMETHAZINE HCL 25 MG PO TABS: 25.0000 mg | ORAL_TABLET | Freq: Four times a day (QID) | ORAL | Status: DC | PRN

## 2011-12-30 MED ORDER — PROMETHAZINE HCL 25 MG/ML IJ SOLN
25.0000 mg | Freq: Once | INTRAMUSCULAR | Status: AC
  Administered 2011-12-30: 25 mg via INTRAVENOUS
  Filled 2011-12-30: qty 1

## 2011-12-30 MED ORDER — HYDROMORPHONE HCL PF 1 MG/ML IJ SOLN: 1.0000 mg | Freq: Once | INTRAMUSCULAR | Status: DC

## 2011-12-30 MED ORDER — ONDANSETRON HCL 4 MG/2ML IJ SOLN
4.0000 mg | Freq: Once | INTRAMUSCULAR | Status: AC
  Administered 2011-12-30: 4 mg via INTRAVENOUS
  Filled 2011-12-30: qty 2

## 2011-12-30 MED ORDER — KETOROLAC TROMETHAMINE 30 MG/ML IJ SOLN
30.0000 mg | Freq: Once | INTRAMUSCULAR | Status: DC
  Filled 2011-12-30: qty 1

## 2011-12-30 NOTE — ED Notes (Signed)
Pt states she has taken morphine before and had no reactions

## 2011-12-30 NOTE — ED Notes (Signed)
Advised edp of dilaudid allergy. vo toradol 30mg  iv, read back and verified. Pt advised of meds ordered.

## 2011-12-30 NOTE — ED Notes (Signed)
Pt taken to [place] at this time.Extra Pillow and socks given

## 2011-12-30 NOTE — ED Notes (Signed)
Generalized back pain radiating into neck x 2 days.  Also c/o body aches, n/v/abd pain.

## 2011-12-30 NOTE — ED Provider Notes (Signed)
History   This chart was scribed for [name], MD, by Frederik Pear, ER scribe. The patient was seen in room APA19/APA19 and the patient's care was started at 0912.    CSN: [id number]  Arrival date & time 12/30/11  0854   First MD Initiated Contact with Patient 12/30/11 [phone]      Chief Complaint  Patient presents with  . multiple complaints     (Consider location/radiation/quality/duration/timing/severity/associated sxs/prior treatment) HPI Comments: Tanya Krause is a 26 y.o. female who presents to the Emergency Department complaining of constant, moderate back pain that radiates to her pelvis with associated dysuria and abdominal pain that began 2 days ago.She also reports associated nausea and emesis that began last night. She reports that the abdominal pain is aggravated by laying flat on her back. She denies any diarrhea.She has a h/o of muscle spasms. She states that her last monthly period was 12/03.   No PCP. Patient is a 26 y.o. female presenting with back pain and abdominal pain. The history is provided by the patient.  Back Pain  This is a new problem. The current episode started 2 days ago. The problem has been gradually worsening. The pain is associated with no known injury. Associated symptoms include abdominal pain and dysuria. Pertinent negatives include no chest pain and no headaches.  Abdominal Pain The primary symptoms of the illness include abdominal pain, nausea, vomiting and dysuria. The primary symptoms of the illness do not include fatigue or diarrhea. The current episode started 2 days ago. The onset of the illness was sudden. The problem has not changed since onset. The abdominal pain began 2 days ago. The abdominal pain has been unchanged since its onset. The abdominal pain is located in the left flank, right flank and suprapubic region. The abdominal pain does not radiate.  Nausea began yesterday.  The vomiting began yesterday.  The dysuria began 2  days ago. The dysuria is not associated with hematuria or frequency.  Additional symptoms associated with the illness include back pain. Symptoms associated with the illness do not include hematuria or frequency.    Past Medical History  Diagnosis Date  . Hypertension   . Migraine   . Asthma   . Pelvic inflammatory disease (PID)     Past Surgical History  Procedure Date  . Tubal ligation   . Kidney infections     Family History  Problem Relation Age of Onset  . Hypertension Mother   . Diabetes Mother   . Heart failure Father     History  Substance Use Topics  . Smoking status: Current Some Day Smoker    Types: Cigarettes  . Smokeless tobacco: Never Used  . Alcohol Use: Yes     Comment: occasionally-once a month    OB History    Grav Para Term Preterm Abortions TAB SAB Ect Mult Living   4 4 4       4       Review of Systems  Constitutional: Negative for fatigue.  HENT: Negative for congestion, sinus pressure and ear discharge.   Eyes: Negative for discharge.  Respiratory: Negative for cough.   Cardiovascular: Negative for chest pain.  Gastrointestinal: Positive for nausea, vomiting and abdominal pain. Negative for diarrhea.  Genitourinary: Positive for dysuria. Negative for frequency and hematuria.  Musculoskeletal: Positive for back pain.  Skin: Negative for rash.  Neurological: Negative for seizures and headaches.  Hematological: Negative.   Psychiatric/Behavioral: Negative for hallucinations.  All other systems reviewed  and are negative.    Allergies  Aspirin; Hydromorphone hcl; Ibuprofen; Other; Shellfish allergy; and Latex  Home Medications   Current Outpatient Rx  Name  Route  Sig  Dispense  Refill  . ACETAMINOPHEN 500 MG PO TABS   Oral   Take 500-1,000 mg by mouth every 6 (six) hours as needed. Pain         . ALBUTEROL SULFATE HFA 108 (90 BASE) MCG/ACT IN AERS   Inhalation   Inhale 2 puffs into the lungs every 6 (six) hours as needed.  Asthma Symptoms          . AMITRIPTYLINE HCL 50 MG PO TABS   Oral   Take 50 mg by mouth at bedtime.         . ATENOLOL 25 MG PO TABS   Oral   Take 25 mg by mouth daily.          Marland Kitchen BUTALBITAL-APAP-CAFFEINE 50-325-40 MG PO TABS   Oral   Take 1 tablet by mouth 2 (two) times daily as needed. FOR MIGRAINE PAIN         . CEPHALEXIN 500 MG PO CAPS   Oral   Take 1 capsule (500 mg total) by mouth 3 (three) times daily.   30 capsule   0   . HYDROCODONE-ACETAMINOPHEN 5-325 MG PO TABS   Oral   Take 1 tablet by mouth once as needed. For pain         . PANTOPRAZOLE SODIUM 20 MG PO TBEC   Oral   Take 1 tablet (20 mg total) by mouth daily.   30 tablet   1   . PHENAZOPYRIDINE HCL 200 MG PO TABS   Oral   Take 1 tablet (200 mg total) by mouth 3 (three) times daily as needed (pain on urination).   6 tablet   0   . PROMETHAZINE HCL 25 MG RE SUPP   Rectal   Place 1 suppository (25 mg total) rectally every 6 (six) hours as needed for nausea.   12 each   0   . PROMETHAZINE HCL 25 MG PO TABS   Oral   Take 25 mg by mouth every 6 (six) hours as needed. For nausea         . SUMATRIPTAN SUCCINATE 100 MG PO TABS   Oral   Take 100 mg by mouth every 2 (two) hours as needed. For migraine          . TRAMADOL-ACETAMINOPHEN 37.5-325 MG PO TABS      2 tabs po QID prn pain   16 tablet   0   . ZOLMITRIPTAN 5 MG PO TABS   Oral   Take 5 mg by mouth as needed. Migraines, may take every 2 hours if needed, not exceeding 2 in a 24 hour period           BP 123/81  Pulse 84  Temp 97.8 F (36.6 C) (Oral)  Resp 16  Ht 5' (1.524 m)  Wt 135 lb (61.236 kg)  BMI 26.37 kg/m2  SpO2 99%  LMP 12/20/2011  Physical Exam  Constitutional: She is oriented to person, place, and time. She appears well-developed.  HENT:  Head: Normocephalic and atraumatic.  Eyes: Conjunctivae normal and EOM are normal. No scleral icterus.  Neck: Neck supple. No thyromegaly present.  Cardiovascular:  Normal rate and regular rhythm.  Exam reveals no gallop and no friction rub.   No murmur heard. Pulmonary/Chest: No stridor. She has no wheezes.  She has no rales. She exhibits no tenderness.  Abdominal: She exhibits no distension. There is tenderness in the suprapubic area. There is no rebound.       She is mildly tender to her suprapubic region and both flank areas.  Musculoskeletal: Normal range of motion. She exhibits no edema.  Lymphadenopathy:    She has no cervical adenopathy.  Neurological: She is oriented to person, place, and time. Coordination normal.  Skin: No rash noted. No erythema.  Psychiatric: She has a normal mood and affect. Her behavior is normal.    ED Course  Procedures (including critical care time)  DIAGNOSTIC STUDIES: Oxygen Saturation is 99% on room air, normal by my interpretation.    COORDINATION OF CARE:  09:20- Discussed planned course of treatment with the patient, including a UA and blood work, who is agreeable at this time.  09:30- Medication Orders- sodium chloride 0.9% bolus, 1,000 mL- Once, ondansetron (ZOFRAN) injection 4 mg-Once.  09:45- Medication Orders-  ketorolac (TORADOL) 30 MG/ML injection 30 mg- Once, ketorolac (TORADOL) 30 mg/ml injection 30 mg- Once.  10:00- Medication Orders- oxyCODONE-acetaminophen (PERCOCET/ROXICET) 5-325 MG per tablet 1 tablet- Once.  10:45- Medication Orders- morphine 4 MG/ML injection 4 mg- Once.  Results for orders placed during the hospital encounter of 12/30/11  CBC WITH DIFFERENTIAL      Component Value Range   WBC 7.6  4.0 - 10.5 K/uL   RBC 4.28  3.87 - 5.11 MIL/uL   Hemoglobin 12.2  12.0 - 15.0 g/dL   HCT 16.1 (*) 09.6 - 04.5 %   MCV 81.1  78.0 - 100.0 fL   MCH 28.5  26.0 - 34.0 pg   MCHC 35.2  30.0 - 36.0 g/dL   RDW 40.9  81.1 - 91.4 %   Platelets 219  150 - 400 K/uL   Neutrophils Relative 42 (*) 43 - 77 %   Neutro Abs 3.2  1.7 - 7.7 K/uL   Lymphocytes Relative 42  12 - 46 %   Lymphs Abs 3.2  0.7  - 4.0 K/uL   Monocytes Relative 11  3 - 12 %   Monocytes Absolute 0.8  0.1 - 1.0 K/uL   Eosinophils Relative 4  0 - 5 %   Eosinophils Absolute 0.3  0.0 - 0.7 K/uL   Basophils Relative 1  0 - 1 %   Basophils Absolute 0.1  0.0 - 0.1 K/uL  COMPREHENSIVE METABOLIC PANEL      Component Value Range   Sodium 141  135 - 145 mEq/L   Potassium 3.6  3.5 - 5.1 mEq/L   Chloride 106  96 - 112 mEq/L   CO2 29  19 - 32 mEq/L   Glucose, Bld 94  70 - 99 mg/dL   BUN 5 (*) 6 - 23 mg/dL   Creatinine, Ser 7.82  0.50 - 1.10 mg/dL   Calcium 9.1  8.4 - 95.6 mg/dL   Total Protein 6.9  6.0 - 8.3 g/dL   Albumin 3.6  3.5 - 5.2 g/dL   AST 14  0 - 37 U/L   ALT 9  0 - 35 U/L   Alkaline Phosphatase 42  39 - 117 U/L   Total Bilirubin 0.3  0.3 - 1.2 mg/dL   GFR calc non Af Amer >90  >90 mL/min   GFR calc Af Amer >90  >90 mL/min  URINALYSIS, ROUTINE W REFLEX MICROSCOPIC      Component Value Range   Color, Urine YELLOW  YELLOW  APPearance CLEAR  CLEAR   Specific Gravity, Urine 1.025  1.005 - 1.030   pH 7.0  5.0 - 8.0   Glucose, UA NEGATIVE  NEGATIVE mg/dL   Hgb urine dipstick NEGATIVE  NEGATIVE   Bilirubin Urine NEGATIVE  NEGATIVE   Ketones, ur TRACE (*) NEGATIVE mg/dL   Protein, ur NEGATIVE  NEGATIVE mg/dL   Urobilinogen, UA 0.2  0.0 - 1.0 mg/dL   Nitrite NEGATIVE  NEGATIVE   Leukocytes, UA NEGATIVE  NEGATIVE  PREGNANCY, URINE      Component Value Range   Preg Test, Ur NEGATIVE  NEGATIVE    Labs Reviewed - No data to display No results found.   No diagnosis found.    MDM  The chart was scribed for me under my direct supervision.  I personally performed the history, physical, and medical decision making and all procedures in the evaluation of this patient.[name], MD 12/30/11 [phone]

## 2012-01-01 LAB — URINE CULTURE: Colony Count: NO GROWTH

## 2012-01-02 ENCOUNTER — Emergency Department (HOSPITAL_COMMUNITY): Payer: Medicaid Other

## 2012-01-02 ENCOUNTER — Encounter (HOSPITAL_COMMUNITY): Payer: Self-pay | Admitting: *Deleted

## 2012-01-02 ENCOUNTER — Emergency Department (HOSPITAL_COMMUNITY)
Admission: EM | Admit: 2012-01-02 | Discharge: 2012-01-03 | Disposition: A | Discharge status: Home / Self Care | Payer: Medicaid Other | Attending: Emergency Medicine | Admitting: Emergency Medicine

## 2012-01-02 DIAGNOSIS — G43909 Migraine, unspecified, not intractable, without status migrainosus: Secondary | ICD-10-CM | POA: Insufficient documentation

## 2012-01-02 DIAGNOSIS — F17200 Nicotine dependence, unspecified, uncomplicated: Secondary | ICD-10-CM | POA: Insufficient documentation

## 2012-01-02 DIAGNOSIS — Z3202 Encounter for pregnancy test, result negative: Secondary | ICD-10-CM | POA: Insufficient documentation

## 2012-01-02 DIAGNOSIS — N739 Female pelvic inflammatory disease, unspecified: Secondary | ICD-10-CM

## 2012-01-02 DIAGNOSIS — Z79899 Other long term (current) drug therapy: Secondary | ICD-10-CM | POA: Insufficient documentation

## 2012-01-02 DIAGNOSIS — I10 Essential (primary) hypertension: Secondary | ICD-10-CM | POA: Insufficient documentation

## 2012-01-02 DIAGNOSIS — J45909 Unspecified asthma, uncomplicated: Secondary | ICD-10-CM | POA: Insufficient documentation

## 2012-01-02 LAB — COMPREHENSIVE METABOLIC PANEL
ALT: 9 U/L (ref 0–35)
AST: 12 U/L (ref 0–37)
CO2: 28 mEq/L (ref 19–32)
Calcium: 9 mg/dL (ref 8.4–10.5)
Chloride: 106 mEq/L (ref 96–112)
Creatinine, Ser: 0.72 mg/dL (ref 0.50–1.10)
GFR calc Af Amer: >90 mL/min (ref >90)
GFR calc non Af Amer: >90 mL/min (ref >90)
Glucose, Bld: 86 mg/dL (ref 70–99)
Total Bilirubin: 0.2 mg/dL — ABNORMAL LOW (ref 0.3–1.2)

## 2012-01-02 LAB — CBC WITH DIFFERENTIAL/PLATELET
Eosinophils Relative: 4 % (ref 0–5)
HCT: 32.3 % — ABNORMAL LOW (ref 36.0–46.0)
Hemoglobin: 11.5 g/dL — ABNORMAL LOW (ref 12.0–15.0)
Lymphocytes Relative: 56 % — ABNORMAL HIGH (ref 12–46)
Lymphs Abs: 4 10*3/uL (ref 0.7–4.0)
MCV: 81.4 fL (ref 78.0–100.0)
Monocytes Absolute: 0.7 10*3/uL (ref 0.1–1.0)
Neutro Abs: 2.1 10*3/uL (ref 1.7–7.7)
RBC: 3.97 MIL/uL (ref 3.87–5.11)
WBC: 7.1 10*3/uL (ref 4.0–10.5)

## 2012-01-02 LAB — WET PREP, GENITAL: Yeast Wet Prep HPF POC: NONE SEEN

## 2012-01-02 LAB — URINALYSIS, ROUTINE W REFLEX MICROSCOPIC
Glucose, UA: NEGATIVE mg/dL
Hgb urine dipstick: NEGATIVE
Leukocytes, UA: NEGATIVE
Protein, ur: NEGATIVE mg/dL
pH: 7 (ref 5.0–8.0)

## 2012-01-02 LAB — POCT PREGNANCY, URINE: Preg Test, Ur: NEGATIVE

## 2012-01-02 MED ORDER — DIPHENHYDRAMINE HCL 50 MG/ML IJ SOLN
25.0000 mg | Freq: Once | INTRAMUSCULAR | Status: AC
  Administered 2012-01-02: 25 mg via INTRAVENOUS
  Filled 2012-01-02: qty 1

## 2012-01-02 MED ORDER — SODIUM CHLORIDE 0.9 % IV BOLUS (SEPSIS)
1000.0000 mL | Freq: Once | INTRAVENOUS | Status: AC
  Administered 2012-01-02: 1000 mL via INTRAVENOUS

## 2012-01-02 MED ORDER — SODIUM CHLORIDE 0.9 % IV SOLN
Freq: Once | INTRAVENOUS | Status: AC
  Administered 2012-01-02: 150 mL/h via INTRAVENOUS

## 2012-01-02 MED ORDER — HYDROMORPHONE HCL PF 1 MG/ML IJ SOLN
1.0000 mg | Freq: Once | INTRAMUSCULAR | Status: AC
  Administered 2012-01-02: 1 mg via INTRAVENOUS
  Filled 2012-01-02: qty 1

## 2012-01-02 MED ORDER — ONDANSETRON HCL 4 MG/2ML IJ SOLN
4.0000 mg | INTRAMUSCULAR | Status: AC
  Administered 2012-01-02: 4 mg via INTRAVENOUS
  Filled 2012-01-02: qty 2

## 2012-01-02 MED ORDER — IOHEXOL 300 MG/ML  SOLN
100.0000 mL | Freq: Once | INTRAMUSCULAR | Status: AC | PRN
  Administered 2012-01-02: 100 mL via INTRAVENOUS

## 2012-01-02 NOTE — ED Notes (Signed)
Pt with abd pain with abd swelling, seen here for same on Friday, + vaginal discharge-thick white, +N/V, denies diarrhea

## 2012-01-02 NOTE — ED Notes (Signed)
Patient ambulatory to restroom to collect urine sample 

## 2012-01-02 NOTE — ED Provider Notes (Signed)
History     CSN: [id number]  Arrival date & time 01/02/12  1840   First MD Initiated Contact with Patient 01/02/12 2006      Chief Complaint  Patient presents with  . Abdominal Pain    (Consider location/radiation/quality/duration/timing/severity/associated sxs/prior treatment) Patient is a 26 y.o. female presenting with abdominal pain. The history is provided by the patient.  Abdominal Pain The primary symptoms of the illness include abdominal pain.  She is complaining of pain across her lower abdomen into the right flank. This is been present for the last 3-4 days and is getting worse. Pain is severe and she rates it at 9/10. It is worse if she lays on her left side and worse if she moves the wrong way. She also noted dyspareunia. There is associated nausea and vomiting. She's not had fever or chills or sweats. There's not been dysuria or urinary urgency or frequency but she has had urinary tenesmus. There's been a thick white vaginal discharge present. Nothing has made the pain any better. She was seen in the emergency department 3 days ago for the same complaints.  Past Medical History  Diagnosis Date  . Hypertension   . Migraine   . Asthma   . Pelvic inflammatory disease (PID)     Past Surgical History  Procedure Date  . Tubal ligation   . Kidney infections     Family History  Problem Relation Age of Onset  . Hypertension Mother   . Diabetes Mother   . Heart failure Father     History  Substance Use Topics  . Smoking status: Current Some Day Smoker    Types: Cigarettes  . Smokeless tobacco: Never Used  . Alcohol Use: Yes     Comment: occasionally-once a month    OB History    Grav Para Term Preterm Abortions TAB SAB Ect Mult Living   4 4 4       4       Review of Systems  Gastrointestinal: Positive for abdominal pain.  All other systems reviewed and are negative.    Allergies  Aspirin; Hydromorphone hcl; Ibuprofen; Other; Shellfish allergy; Toradol;  and Latex  Home Medications   Current Outpatient Rx  Name  Route  Sig  Dispense  Refill  . ALBUTEROL SULFATE HFA 108 (90 BASE) MCG/ACT IN AERS   Inhalation   Inhale 2 puffs into the lungs every 6 (six) hours as needed. Asthma Symptoms          . AMITRIPTYLINE HCL 50 MG PO TABS   Oral   Take 50 mg by mouth at bedtime.         Marland Kitchen HYDROCODONE-ACETAMINOPHEN 5-325 MG PO TABS   Oral   Take 1 tablet by mouth every 6 (six) hours as needed. For pain         . PROMETHAZINE HCL 25 MG PO TABS   Oral   Take 1 tablet (25 mg total) by mouth every 6 (six) hours as needed for nausea.   15 tablet   0   . SUMATRIPTAN SUCCINATE 100 MG PO TABS   Oral   Take 100 mg by mouth every 2 (two) hours as needed. For migraine          . ZOLMITRIPTAN 5 MG PO TABS   Oral   Take 5 mg by mouth as needed. Migraines, may take every 2 hours if needed, not exceeding 2 in a 24 hour period  BP 143/79  Pulse 91  Temp 98.1 F (36.7 C) (Oral)  Resp 18  Ht 5' (1.524 m)  Wt 135 lb (61.236 kg)  BMI 26.37 kg/m2  SpO2 100%  LMP 12/20/2011  Physical Exam  Nursing note and vitals reviewed. Genitourinary:       Normal external female genitalia. Cervix is normal. Moderate with white vaginal discharge is present. There is moderate to severe cervical motion tenderness and bilateral adnexal tenderness which is worse on the right. Fundus is normal size and position. No adnexal masses are felt.  26 year old female, resting comfortably and in no acute distress. Vital signs are significant for borderline hypertension with blood pressure 143/79. Oxygen saturation is 100%, which is normal. Head is normocephalic and atraumatic. PERRLA, EOMI. Oropharynx is clear. Neck is nontender and supple without adenopathy or JVD. Back is nontender. Lungs are clear without rales, wheezes, or rhonchi. Chest is nontender. Heart has regular rate and rhythm without murmur. Abdomen is soft, flat, with mild to moderate  tenderness diffusely. Tenderness is most severe on the right lower quadrant with some voluntary guarding. There is no rebound tenderness present. There is mild right CVA tenderness. There are no masses or hepatosplenomegaly and peristalsis is hypoactive. Extremities have no cyanosis or edema, full range of motion is present. Skin is warm and dry without rash. Neurologic: Mental status is normal, cranial nerves are intact, there are no motor or sensory deficits.   ED Course  Procedures (including critical care time)  Results for orders placed during the hospital encounter of 01/02/12  CBC WITH DIFFERENTIAL      Component Value Range   WBC 7.1  4.0 - 10.5 K/uL   RBC 3.97  3.87 - 5.11 MIL/uL   Hemoglobin 11.5 (*) 12.0 - 15.0 g/dL   HCT 52.8 (*) 41.3 - 24.4 %   MCV 81.4  78.0 - 100.0 fL   MCH 29.0  26.0 - 34.0 pg   MCHC 35.6  30.0 - 36.0 g/dL   RDW 01.0  27.2 - 53.6 %   Platelets 195  150 - 400 K/uL   Neutrophils Relative 30 (*) 43 - 77 %   Neutro Abs 2.1  1.7 - 7.7 K/uL   Lymphocytes Relative 56 (*) 12 - 46 %   Lymphs Abs 4.0  0.7 - 4.0 K/uL   Monocytes Relative 9  3 - 12 %   Monocytes Absolute 0.7  0.1 - 1.0 K/uL   Eosinophils Relative 4  0 - 5 %   Eosinophils Absolute 0.3  0.0 - 0.7 K/uL   Basophils Relative 1  0 - 1 %   Basophils Absolute 0.1  0.0 - 0.1 K/uL  COMPREHENSIVE METABOLIC PANEL      Component Value Range   Sodium 139  135 - 145 mEq/L   Potassium 3.8  3.5 - 5.1 mEq/L   Chloride 106  96 - 112 mEq/L   CO2 28  19 - 32 mEq/L   Glucose, Bld 86  70 - 99 mg/dL   BUN 8  6 - 23 mg/dL   Creatinine, Ser 6.44  0.50 - 1.10 mg/dL   Calcium 9.0  8.4 - 03.4 mg/dL   Total Protein 6.5  6.0 - 8.3 g/dL   Albumin 3.5  3.5 - 5.2 g/dL   AST 12  0 - 37 U/L   ALT 9  0 - 35 U/L   Alkaline Phosphatase 39  39 - 117 U/L   Total Bilirubin 0.2 (*)  0.3 - 1.2 mg/dL   GFR calc non Af Amer >90  >90 mL/min   GFR calc Af Amer >90  >90 mL/min  LIPASE, BLOOD      Component Value Range   Lipase  30  11 - 59 U/L  URINALYSIS, ROUTINE W REFLEX MICROSCOPIC      Component Value Range   Color, Urine YELLOW  YELLOW   APPearance CLEAR  CLEAR   Specific Gravity, Urine 1.015  1.005 - 1.030   pH 7.0  5.0 - 8.0   Glucose, UA NEGATIVE  NEGATIVE mg/dL   Hgb urine dipstick NEGATIVE  NEGATIVE   Bilirubin Urine NEGATIVE  NEGATIVE   Ketones, ur NEGATIVE  NEGATIVE mg/dL   Protein, ur NEGATIVE  NEGATIVE mg/dL   Urobilinogen, UA 0.2  0.0 - 1.0 mg/dL   Nitrite NEGATIVE  NEGATIVE   Leukocytes, UA NEGATIVE  NEGATIVE  WET PREP, GENITAL      Component Value Range   Yeast Wet Prep HPF POC NONE SEEN  NONE SEEN   Trich, Wet Prep NONE SEEN  NONE SEEN   Clue Cells Wet Prep HPF POC FEW (*) NONE SEEN   WBC, Wet Prep HPF POC MODERATE (*) NONE SEEN  POCT PREGNANCY, URINE      Component Value Range   Preg Test, Ur NEGATIVE  NEGATIVE   Ct Abdomen Pelvis W Contrast  01/02/2012  *RADIOLOGY REPORT*  Clinical Data: Abdominal pain  CT ABDOMEN AND PELVIS WITH CONTRAST  Technique:  Multidetector CT imaging of the abdomen and pelvis was performed following the standard protocol during bolus administration of intravenous contrast.  Contrast: OMNIPAQUE IOHEXOL 300 MG/ML  SOLN  Comparison: 12/30/2011 ultrasound, 10/28/2011 CT  Findings: Limited images through the lung bases demonstrate no significant appreciable abnormality. The heart size is within normal limits. No pleural or pericardial effusion.  Geographic hypoattenuation adjacent the falciform ligament is most in keeping with focal fat.  Additional more ill-defined hypoattenuation within the right hepatic lobe peripherally (series 2 image 17 and 18 is nonspecific.  Unremarkable spleen, pancreas, biliary system, adrenal glands.  Symmetric renal enhancement.  No hydronephrosis or hydroureter.  No bowel obstruction.  No CT evidence for colitis.  Moderate stool burden.  Appendix within normal limits.  No free intraperitoneal air.  Small amount of free fluid within the  pelvis.  No lymphadenopathy. Subcentimeter short axis of mildly prominent lymph nodes along the right gonadal vasculature.  Normal caliber aorta and branch vessels.  Thin-walled bladder.  Nonspecific appearance to the uterus and adnexa.  Questionable corpus luteal cyst on the right.  No acute osseous finding.  IMPRESSION: Normal appendix.  Nonspecific appearance to the uterus and adnexa. Consider ultrasound if clinically warranted.  Ill-defined hypoattenuation within the periphery of the right hepatic lobe.  This is nonspecific however more prominent in the interval.  May reflect areas of fatty infiltration.  Consider a non emergent MRI follow-up.   Original Report Authenticated By: Jearld Lesch, M.D.      1. Pelvic inflammatory disease       MDM  Abdominal and flank pain worrisome for possible UTI. Abdominal tenderness is fairly well localized in the right lower quadrant worrisome for possible appendicitis. She needs evaluation for her vaginal discharge. She is probably not a significant risk for PID with a history of tubal ligation. CT scan has been ordered to evaluate her abdominal pain. Old records are reviewed, and she was seen here 3 days ago at which time pelvic ultrasound was  obtained, but no pelvic exam had been done. She has been seen in the ED multiple times for abdominal pain and was diagnosed on one occasion with pelvic inflammatory disease, but all GC and Chlamydia cultures of been negative.  CT has come back unremarkable. Exam is most consistent with pelvic inflammatory disease, even though she is status post tubal ligation. She's given a dose of Rocephin and doxycycline in the emergency department and that will be sent home with prescriptions for doxycycline, Percocet, and metoclopramide. She is referred to Dr. Despina Hidden, who is on call for gynecology, for followup.  Dione Booze, MD 01/03/12 0005

## 2012-01-02 NOTE — ED Notes (Signed)
Ambulated to bathroom with assistance - voided.  Recheck of temp 98.0

## 2012-01-02 NOTE — ED Notes (Signed)
Post pelvic exam - patients pain is now increased and she have vomited.  Had finished about 2/3 of the constrast and now vomited this up.

## 2012-01-03 LAB — RPR: RPR Ser Ql: NONREACTIVE

## 2012-01-03 MED ORDER — OXYCODONE-ACETAMINOPHEN 5-325 MG PO TABS: 1.0000 | ORAL_TABLET | ORAL | Status: AC | PRN

## 2012-01-03 MED ORDER — DOXYCYCLINE HYCLATE 100 MG PO TABS
100.0000 mg | ORAL_TABLET | Freq: Two times a day (BID) | ORAL | Status: DC
  Administered 2012-01-03: 100 mg via ORAL
  Filled 2012-01-03 (×2): qty 1

## 2012-01-03 MED ORDER — DOXYCYCLINE HYCLATE 100 MG PO CAPS: 100.0000 mg | ORAL_CAPSULE | Freq: Two times a day (BID) | ORAL | Status: DC

## 2012-01-03 MED ORDER — METOCLOPRAMIDE HCL 5 MG/ML IJ SOLN
10.0000 mg | Freq: Once | INTRAMUSCULAR | Status: AC
  Administered 2012-01-03: 10 mg via INTRAVENOUS
  Filled 2012-01-03 (×2): qty 2

## 2012-01-03 MED ORDER — METOCLOPRAMIDE HCL 10 MG PO TABS: 10.0000 mg | ORAL_TABLET | Freq: Four times a day (QID) | ORAL | Status: DC | PRN

## 2012-01-03 MED ORDER — DEXTROSE 5 % IV SOLN
1.0000 g | Freq: Once | INTRAVENOUS | Status: AC
  Administered 2012-01-03: 1 g via INTRAVENOUS
  Filled 2012-01-03 (×2): qty 10

## 2012-01-04 LAB — GC/CHLAMYDIA PROBE AMP: CT Probe RNA: NEGATIVE

## 2012-01-14 ENCOUNTER — Encounter (HOSPITAL_COMMUNITY): Payer: Self-pay | Admitting: *Deleted

## 2012-01-14 ENCOUNTER — Emergency Department (HOSPITAL_COMMUNITY)
Admission: EM | Admit: 2012-01-14 | Discharge: 2012-01-14 | Disposition: A | Discharge status: Home / Self Care | Payer: Medicaid Other | Attending: Emergency Medicine | Admitting: Emergency Medicine

## 2012-01-14 ENCOUNTER — Emergency Department (HOSPITAL_COMMUNITY): Payer: Medicaid Other

## 2012-01-14 DIAGNOSIS — K921 Melena: Secondary | ICD-10-CM | POA: Insufficient documentation

## 2012-01-14 DIAGNOSIS — Z79899 Other long term (current) drug therapy: Secondary | ICD-10-CM | POA: Insufficient documentation

## 2012-01-14 DIAGNOSIS — F17200 Nicotine dependence, unspecified, uncomplicated: Secondary | ICD-10-CM | POA: Insufficient documentation

## 2012-01-14 DIAGNOSIS — I10 Essential (primary) hypertension: Secondary | ICD-10-CM | POA: Insufficient documentation

## 2012-01-14 DIAGNOSIS — R1110 Vomiting, unspecified: Secondary | ICD-10-CM | POA: Insufficient documentation

## 2012-01-14 DIAGNOSIS — N730 Acute parametritis and pelvic cellulitis: Secondary | ICD-10-CM

## 2012-01-14 DIAGNOSIS — J45909 Unspecified asthma, uncomplicated: Secondary | ICD-10-CM | POA: Insufficient documentation

## 2012-01-14 DIAGNOSIS — Z9851 Tubal ligation status: Secondary | ICD-10-CM | POA: Insufficient documentation

## 2012-01-14 DIAGNOSIS — G43909 Migraine, unspecified, not intractable, without status migrainosus: Secondary | ICD-10-CM | POA: Insufficient documentation

## 2012-01-14 DIAGNOSIS — N739 Female pelvic inflammatory disease, unspecified: Secondary | ICD-10-CM | POA: Insufficient documentation

## 2012-01-14 DIAGNOSIS — G8929 Other chronic pain: Secondary | ICD-10-CM | POA: Insufficient documentation

## 2012-01-14 LAB — CBC WITH DIFFERENTIAL/PLATELET
Basophils Absolute: 0 10*3/uL (ref 0.0–0.1)
Basophils Relative: 1 % (ref 0–1)
Eosinophils Absolute: 0.3 10*3/uL (ref 0.0–0.7)
MCH: 27.2 pg (ref 26.0–34.0)
MCHC: 33.8 g/dL (ref 30.0–36.0)
Neutro Abs: 2.6 10*3/uL (ref 1.7–7.7)
Neutrophils Relative %: 36 % — ABNORMAL LOW (ref 43–77)
Platelets: 246 10*3/uL (ref 150–400)
RDW: 13.7 % (ref 11.5–15.5)

## 2012-01-14 LAB — COMPREHENSIVE METABOLIC PANEL
AST: 15 U/L (ref 0–37)
Albumin: 3.6 g/dL (ref 3.5–5.2)
Alkaline Phosphatase: 39 U/L (ref 39–117)
BUN: 6 mg/dL (ref 6–23)
Chloride: 108 mEq/L (ref 96–112)
Potassium: 3.6 mEq/L (ref 3.5–5.1)
Sodium: 142 mEq/L (ref 135–145)
Total Protein: 6.6 g/dL (ref 6.0–8.3)

## 2012-01-14 LAB — LIPASE, BLOOD: Lipase: 39 U/L (ref 11–59)

## 2012-01-14 LAB — URINALYSIS, MICROSCOPIC ONLY
Bilirubin Urine: NEGATIVE
Glucose, UA: NEGATIVE mg/dL
Leukocytes, UA: NEGATIVE
Nitrite: NEGATIVE
Specific Gravity, Urine: 1.024 (ref 1.005–1.030)
pH: 7.5 (ref 5.0–8.0)

## 2012-01-14 LAB — WET PREP, GENITAL: Yeast Wet Prep HPF POC: NONE SEEN

## 2012-01-14 MED ORDER — HYDROMORPHONE HCL PF 1 MG/ML IJ SOLN
1.0000 mg | Freq: Once | INTRAMUSCULAR | Status: AC
  Administered 2012-01-14: 1 mg via INTRAVENOUS
  Filled 2012-01-14: qty 1

## 2012-01-14 MED ORDER — GI COCKTAIL ~~LOC~~
30.0000 mL | Freq: Once | ORAL | Status: AC
  Administered 2012-01-14: 30 mL via ORAL
  Filled 2012-01-14: qty 30

## 2012-01-14 MED ORDER — PROMETHAZINE HCL 25 MG/ML IJ SOLN
12.5000 mg | Freq: Once | INTRAMUSCULAR | Status: AC
  Administered 2012-01-14: 12.5 mg via INTRAVENOUS
  Filled 2012-01-14: qty 1

## 2012-01-14 MED ORDER — METRONIDAZOLE 500 MG PO TABS
500.0000 mg | ORAL_TABLET | Freq: Once | ORAL | Status: AC
  Administered 2012-01-14: 500 mg via ORAL
  Filled 2012-01-14: qty 1

## 2012-01-14 MED ORDER — SODIUM CHLORIDE 0.9 % IV BOLUS (SEPSIS)
1000.0000 mL | Freq: Once | INTRAVENOUS | Status: AC
  Administered 2012-01-14: 1000 mL via INTRAVENOUS

## 2012-01-14 MED ORDER — ONDANSETRON HCL 4 MG/2ML IJ SOLN
4.0000 mg | Freq: Once | INTRAMUSCULAR | Status: AC
  Administered 2012-01-14: 4 mg via INTRAVENOUS
  Filled 2012-01-14: qty 2

## 2012-01-14 MED ORDER — ACETAMINOPHEN 500 MG PO TABS
1000.0000 mg | ORAL_TABLET | Freq: Once | ORAL | Status: AC
  Administered 2012-01-14: 1000 mg via ORAL
  Filled 2012-01-14: qty 2

## 2012-01-14 MED ORDER — SODIUM CHLORIDE 0.9 % IV SOLN
Freq: Once | INTRAVENOUS | Status: AC
  Administered 2012-01-14: 20:00:00 via INTRAVENOUS

## 2012-01-14 MED ORDER — ONDANSETRON 4 MG PO TBDP: 4.0000 mg | ORAL_TABLET | Freq: Three times a day (TID) | ORAL | Status: DC | PRN

## 2012-01-14 MED ORDER — LIDOCAINE HCL (PF) 1 % IJ SOLN
INTRAMUSCULAR | Status: AC
  Administered 2012-01-14: 1 mL via INTRAMUSCULAR
  Filled 2012-01-14: qty 5

## 2012-01-14 MED ORDER — ALBUTEROL SULFATE HFA 108 (90 BASE) MCG/ACT IN AERS
2.0000 | INHALATION_SPRAY | Freq: Once | RESPIRATORY_TRACT | Status: AC
  Administered 2012-01-14: 2 via RESPIRATORY_TRACT
  Filled 2012-01-14: qty 6.7

## 2012-01-14 MED ORDER — ALBUTEROL SULFATE HFA 108 (90 BASE) MCG/ACT IN AERS: 2.0000 | INHALATION_SPRAY | Freq: Once | RESPIRATORY_TRACT | Status: DC

## 2012-01-14 MED ORDER — AZITHROMYCIN 250 MG PO TABS
2000.0000 mg | ORAL_TABLET | Freq: Once | ORAL | Status: AC
  Administered 2012-01-14: 2000 mg via ORAL
  Filled 2012-01-14: qty 8

## 2012-01-14 MED ORDER — METRONIDAZOLE 500 MG PO TABS: 500.0000 mg | ORAL_TABLET | Freq: Two times a day (BID) | ORAL | Status: DC

## 2012-01-14 MED ORDER — DIPHENHYDRAMINE HCL 50 MG/ML IJ SOLN
25.0000 mg | Freq: Once | INTRAMUSCULAR | Status: AC
  Administered 2012-01-14: 25 mg via INTRAVENOUS
  Filled 2012-01-14: qty 1

## 2012-01-14 MED ORDER — CEFTRIAXONE SODIUM 250 MG IJ SOLR
250.0000 mg | Freq: Once | INTRAMUSCULAR | Status: AC
  Administered 2012-01-14: 250 mg via INTRAMUSCULAR
  Filled 2012-01-14: qty 250

## 2012-01-14 NOTE — ED Notes (Signed)
Pt c/o chest pressure and sob.  Pt coughing dry cough.  States "it feels like I do when I am having an asthma attack and I use my inhaler"  But states "I don't have my inhaler with me".  Dr. March Rummage aware.

## 2012-01-14 NOTE — ED Notes (Signed)
Pt requesting pain medication.  Dr. March Rummage aware.

## 2012-01-14 NOTE — ED Notes (Signed)
POCT pregnancy and POCT occult stool NEGATIVE

## 2012-01-14 NOTE — ED Notes (Signed)
MD at bedside. 

## 2012-01-14 NOTE — ED Notes (Signed)
Pt reports abd pain, n/v that's been going on >1 month, has been seen multiple times at [facility] for same. Reports feeling like its her pancreas due to abd distention at times. No acute distress noted at this time.

## 2012-01-14 NOTE — ED Provider Notes (Signed)
History     CSN: [id number]  Arrival date & time 01/14/12  1219   First MD Initiated Contact with Patient 01/14/12 1543      Chief Complaint  Patient presents with  . Abdominal Pain  . Emesis     HPI chief complaint abdominal pain. Patient arrived by private vehicle. Onset more than one month ago. Location abdomen. Symptoms not improved or worsened by anything. It quality dull. No radiation. Severity moderate. Timing constant. Duration more than one month. Context the patient had numerous emergency department visits with negative workups in the interim. For associated signs and symptoms the review of systems. Treatments prior to arrival included PID treatment but the patient cannot tolerate the doxycycline. Regarding recent medical care see MDM section. Sexual history noncontributory. For social history see nurse's notes. I have reviewed patient's past medical, past surgical, social history as well as medications and allergies.  Past Medical History  Diagnosis Date  . Hypertension   . Migraine   . Asthma   . Pelvic inflammatory disease (PID)     Past Surgical History  Procedure Date  . Tubal ligation   . Kidney infections     Family History  Problem Relation Age of Onset  . Hypertension Mother   . Diabetes Mother   . Heart failure Father     History  Substance Use Topics  . Smoking status: Current Some Day Smoker    Types: Cigarettes  . Smokeless tobacco: Never Used  . Alcohol Use: Yes     Comment: occasionally-once a month    OB History    Grav Para Term Preterm Abortions TAB SAB Ect Mult Living   4 4 4       4       Review of Systems  Constitutional: Negative for fever and chills.  HENT: Negative for trouble swallowing, neck pain and neck stiffness.   Eyes: Negative for pain, discharge and itching.  Respiratory: Negative for cough, chest tightness and shortness of breath.   Cardiovascular: Negative for chest pain, palpitations and leg swelling.    Gastrointestinal: Positive for nausea, vomiting, abdominal pain and blood in stool. Negative for diarrhea, constipation, abdominal distention and rectal pain.  Genitourinary: Negative for dysuria, urgency, frequency, hematuria, flank pain, decreased urine volume, vaginal bleeding, vaginal discharge, difficulty urinating, genital sores, vaginal pain, menstrual problem and pelvic pain.  Musculoskeletal: Negative for back pain, joint swelling and gait problem.  Skin: Negative for rash and wound.  Neurological: Negative for dizziness, tremors, seizures, syncope, facial asymmetry, speech difficulty, weakness, light-headedness, numbness and headaches.  Hematological: Negative for adenopathy. Does not bruise/bleed easily.  Psychiatric/Behavioral: Negative for confusion and decreased concentration.    Allergies  Aspirin; Doxycycline; Hydromorphone hcl; Ibuprofen; Metoclopramide; Other; Shellfish allergy; Toradol; and Latex  Home Medications   Current Outpatient Rx  Name  Route  Sig  Dispense  Refill  . ALBUTEROL SULFATE HFA 108 (90 BASE) MCG/ACT IN AERS   Inhalation   Inhale 2 puffs into the lungs every 6 (six) hours as needed. Asthma Symptoms          . AMITRIPTYLINE HCL 50 MG PO TABS   Oral   Take 50 mg by mouth at bedtime.         . OXYCODONE-ACETAMINOPHEN 5-325 MG PO TABS   Oral   Take 1 tablet by mouth every 4 (four) hours as needed. For pain         . SUMATRIPTAN SUCCINATE 100 MG PO TABS   Oral  Take 100 mg by mouth every 2 (two) hours as needed. For migraine          . ZOLMITRIPTAN 5 MG PO TABS   Oral   Take 5 mg by mouth as needed. Migraines, may take every 2 hours if needed, not exceeding 2 in a 24 hour period           BP 144/91  Pulse 103  Temp 98.2 F (36.8 C) (Oral)  Resp 16  SpO2 100%  LMP 12/20/2011  Physical Exam  Constitutional: She is oriented to person, place, and time. She appears well-developed and well-nourished. No distress.  HENT:  Head:  Normocephalic and atraumatic.  Eyes: Conjunctivae normal are normal. Right eye exhibits no discharge. Left eye exhibits no discharge. No scleral icterus.  Neck: Normal range of motion. Neck supple.  Cardiovascular: Normal rate, regular rhythm, normal heart sounds and intact distal pulses.   No murmur heard. Pulmonary/Chest: Effort normal and breath sounds normal. No respiratory distress.  Abdominal: Soft. Normal appearance and bowel sounds are normal. She exhibits no distension and no mass. There is generalized tenderness. There is no rigidity, no rebound, no guarding, no CVA tenderness, no tenderness at McBurney's point and negative Murphy's sign. No hernia. Hernia confirmed negative in the right inguinal area and confirmed negative in the left inguinal area.  Genitourinary: Rectal exam shows no external hemorrhoid, no internal hemorrhoid, no fissure, no mass and anal tone normal. Guaiac negative stool. Pelvic exam was performed with patient supine. There is no rash, tenderness, lesion or injury on the right labia. There is no rash, tenderness, lesion or injury on the left labia. Uterus is tender. Cervix exhibits motion tenderness. Cervix exhibits no discharge. Right adnexum displays tenderness. Right adnexum displays no mass and no fullness. Left adnexum displays tenderness. Left adnexum displays no mass and no fullness. There is bleeding around the vagina. No erythema around the vagina. No foreign body around the vagina. No signs of injury around the vagina. No vaginal discharge found.       Chaperone present at all times.  Musculoskeletal: Normal range of motion. She exhibits no tenderness.  Lymphadenopathy:       Right: No inguinal adenopathy present.       Left: No inguinal adenopathy present.  Neurological: She is alert and oriented to person, place, and time.  Skin: Skin is warm and dry. She is not diaphoretic.  Psychiatric: She has a normal mood and affect.    ED Course  Procedures  (including critical care time)  Labs Reviewed  CBC WITH DIFFERENTIAL - Abnormal; Notable for the following:    RBC 3.83 (*)     Hemoglobin 10.4 (*)     HCT 30.8 (*)     Neutrophils Relative 36 (*)     Lymphocytes Relative 51 (*)     All other components within normal limits  COMPREHENSIVE METABOLIC PANEL - Abnormal; Notable for the following:    Total Bilirubin 0.2 (*)     All other components within normal limits  URINALYSIS, MICROSCOPIC ONLY - Abnormal; Notable for the following:    Hgb urine dipstick MODERATE (*)     All other components within normal limits  LIPASE, BLOOD  H. PYLORI ANTIBODY, IGG  WET PREP, GENITAL  GC/CHLAMYDIA PROBE AMP   Dg Chest 2 View  01/14/2012  *RADIOLOGY REPORT*  Clinical Data: Shortness of breath and chest pain  CHEST - 2 VIEW  Comparison: 06/10/2010  Findings: The lungs are clear without focal  consolidation, edema, effusion or pneumothorax.  Cardiopericardial silhouette is within normal limits for size.  Imaged bony structures of the thorax are intact.  IMPRESSION: Normal exam.   Original Report Authenticated By: Kennith Center, M.D.      1. Chronic abdominal pain   2. PID (acute pelvic inflammatory disease)       MDM  Patient is a well-appearing 26 year old African American female with more than one month of abdominal pain nausea vomiting with numerous emergency department workups. Most recently she had a unremarkable pelvic ultrasound on December 13 and on December 16 she had a negative CT scan of her abdomen and pelvis. Patient has been treated for pelvic inflammatory disease although her recent cultures came back negative. Patient states she cannot tolerate the doxycycline. Afebrile. Diffuse abdominal and pelvic pain with cervical motion tenderness. No peritoneal findings on exam. Given the patient's long-standing unchanged symptoms and extensive negative [facility] department workup I feel patient's clinical picture is doubtful for acute cholecystitis,  choledocholithiasis, hepatitis, gastric perforation, bowel obstruction, DKA, mesenteric ischemia, pyelonephritis, nephrolithiasis, UTI, tubo-ovarian abscess, ovarian torsion, appendicitis or any other surgical causes of abdominal pain. Pelvic shows diffuse tenderness but no discharge. Pregnancy test negative. Patient. We treated with Rocephin and azithromycin and discharged on Flagyl for possible PID. Patient instructed to not receive pain medication given the chronicity of her symptoms and negative workup thus far. Patient instructed to follow up with specialist including OB/GYN as previously instructed. Strict return precautions discussed.. Understanding verbalized. H. pylori pending.        Tanya Masse, MD 01/15/12 [phone]

## 2012-01-15 NOTE — ED Provider Notes (Signed)
I  reviewed the resident's note and I agree with the findings and plan.    Nelia Shi, MD 01/15/12 1534

## 2012-01-16 LAB — GC/CHLAMYDIA PROBE AMP: CT Probe RNA: NEGATIVE

## 2012-01-16 LAB — OCCULT BLOOD, POC DEVICE: Fecal Occult Bld: NEGATIVE

## 2012-01-30 ENCOUNTER — Encounter: Payer: Self-pay | Admitting: Advanced Practice Midwife

## 2012-02-08 ENCOUNTER — Encounter: Payer: Self-pay | Admitting: Obstetrics and Gynecology

## 2012-02-27 ENCOUNTER — Encounter (HOSPITAL_COMMUNITY): Payer: Self-pay | Admitting: Emergency Medicine

## 2012-02-27 ENCOUNTER — Emergency Department (HOSPITAL_COMMUNITY)
Admission: EM | Admit: 2012-02-27 | Discharge: 2012-02-27 | Disposition: A | Discharge status: Home / Self Care | Payer: Medicaid Other | Attending: Emergency Medicine | Admitting: Emergency Medicine

## 2012-02-27 DIAGNOSIS — N739 Female pelvic inflammatory disease, unspecified: Secondary | ICD-10-CM | POA: Insufficient documentation

## 2012-02-27 DIAGNOSIS — F17200 Nicotine dependence, unspecified, uncomplicated: Secondary | ICD-10-CM | POA: Insufficient documentation

## 2012-02-27 DIAGNOSIS — Z792 Long term (current) use of antibiotics: Secondary | ICD-10-CM | POA: Insufficient documentation

## 2012-02-27 DIAGNOSIS — Z79899 Other long term (current) drug therapy: Secondary | ICD-10-CM | POA: Insufficient documentation

## 2012-02-27 DIAGNOSIS — J45909 Unspecified asthma, uncomplicated: Secondary | ICD-10-CM | POA: Insufficient documentation

## 2012-02-27 DIAGNOSIS — M436 Torticollis: Secondary | ICD-10-CM | POA: Insufficient documentation

## 2012-02-27 DIAGNOSIS — M542 Cervicalgia: Secondary | ICD-10-CM | POA: Insufficient documentation

## 2012-02-27 DIAGNOSIS — G43909 Migraine, unspecified, not intractable, without status migrainosus: Secondary | ICD-10-CM | POA: Insufficient documentation

## 2012-02-27 DIAGNOSIS — I10 Essential (primary) hypertension: Secondary | ICD-10-CM | POA: Insufficient documentation

## 2012-02-27 MED ORDER — CYCLOBENZAPRINE HCL 10 MG PO TABS: 10.0000 mg | ORAL_TABLET | Freq: Two times a day (BID) | ORAL | Status: DC | PRN

## 2012-02-27 MED ORDER — DIAZEPAM 5 MG/ML IJ SOLN
5.0000 mg | Freq: Once | INTRAMUSCULAR | Status: AC
  Administered 2012-02-27: 5 mg via INTRAMUSCULAR
  Filled 2012-02-27: qty 2

## 2012-02-27 MED ORDER — DIPHENHYDRAMINE HCL 50 MG/ML IJ SOLN
25.0000 mg | Freq: Once | INTRAMUSCULAR | Status: AC
  Administered 2012-02-27: 25 mg via INTRAMUSCULAR
  Filled 2012-02-27: qty 1

## 2012-02-27 MED ORDER — PROMETHAZINE HCL 25 MG/ML IJ SOLN
25.0000 mg | Freq: Once | INTRAMUSCULAR | Status: AC
  Administered 2012-02-27: 25 mg via INTRAMUSCULAR
  Filled 2012-02-27: qty 1

## 2012-02-27 NOTE — ED Notes (Signed)
Pt c/o onset of migraine on Thursday. Pt started vomiting last night. Has taken Phenegran x 2 today. Last dose at 1400.

## 2012-02-27 NOTE — ED Provider Notes (Signed)
History     CSN: [id number]  Arrival date & time 02/27/12  1743   First MD Initiated Contact with Patient 02/27/12 1823      Chief Complaint  Patient presents with  . Migraine    (Consider location/radiation/quality/duration/timing/severity/associated sxs/prior treatment) HPI Comments: Patient comes to the ER for evaluation of migraine headache. Patient reports a previous history of recurrent migraine. Patient reports that she has been having this particular headache for 4 days. Patient reports severe pain on the left side of her neck radiating up into the back of her head. The neck pain started first, now she is having more global throbbing headache pain. She has used all of her home medications without improvement. She contacted her neurologist today but he was unable to see her.  Patient is a 27 y.o. female presenting with migraines.  Migraine Associated symptoms include headaches.    Past Medical History  Diagnosis Date  . Hypertension   . Migraine   . Asthma   . Pelvic inflammatory disease (PID)     Past Surgical History  Procedure Laterality Date  . Tubal ligation    . Kidney infections      Family History  Problem Relation Age of Onset  . Hypertension Mother   . Diabetes Mother   . Heart failure Father     History  Substance Use Topics  . Smoking status: Current Some Day Smoker    Types: Cigarettes  . Smokeless tobacco: Never Used  . Alcohol Use: Yes     Comment: occasionally-once a month    OB History   Grav Para Term Preterm Abortions TAB SAB Ect Mult Living   4 4 4       4       Review of Systems  Constitutional: Negative for fever.  HENT: Positive for neck pain.   Neurological: Positive for headaches.    Allergies  Aspirin; Doxycycline; Hydromorphone hcl; Ibuprofen; Metoclopramide; Other; Shellfish allergy; Toradol; and Latex  Home Medications   Current Outpatient Rx  Name  Route  Sig  Dispense  Refill  . albuterol (PROVENTIL  HFA;VENTOLIN HFA) 108 (90 BASE) MCG/ACT inhaler   Inhalation   Inhale 2 puffs into the lungs every 6 (six) hours as needed. Asthma Symptoms          . albuterol (PROVENTIL HFA;VENTOLIN HFA) 108 (90 BASE) MCG/ACT inhaler   Inhalation   Inhale 2 puffs into the lungs once.   1 Inhaler   1     With spacer.   Marland Kitchen amitriptyline (ELAVIL) 50 MG tablet   Oral   Take 50 mg by mouth at bedtime.         . metroNIDAZOLE (FLAGYL) 500 MG tablet   Oral   Take 1 tablet (500 mg total) by mouth 2 (two) times daily.   28 tablet   0   . ondansetron (ZOFRAN ODT) 4 MG disintegrating tablet   Oral   Take 1 tablet (4 mg total) by mouth every 8 (eight) hours as needed for nausea.   10 tablet   0   . oxyCODONE-acetaminophen (PERCOCET/ROXICET) 5-325 MG per tablet   Oral   Take 1 tablet by mouth every 4 (four) hours as needed. For pain         . SUMAtriptan (IMITREX) 100 MG tablet   Oral   Take 100 mg by mouth every 2 (two) hours as needed. For migraine          . zolmitriptan (ZOMIG) 5 MG  tablet   Oral   Take 5 mg by mouth as needed. Migraines, may take every 2 hours if needed, not exceeding 2 in a 24 hour period           BP 144/117  Pulse 76  Temp(Src) 97.8 F (36.6 C) (Oral)  Ht 5' (1.524 m)  Wt 145 lb (65.772 kg)  BMI 28.32 kg/m2  SpO2 100%  Physical Exam  Constitutional: She is oriented to person, place, and time. She appears well-developed and well-nourished. No distress.  HENT:  Head: Normocephalic and atraumatic.  Right Ear: Hearing normal.  Nose: Nose normal.  Mouth/Throat: Oropharynx is clear and moist and mucous membranes are normal.  Eyes: Conjunctivae and EOM are normal. Pupils are equal, round, and reactive to light.  Neck: Neck supple. Muscular tenderness present. No Brudzinski's sign and no Kernig's sign noted.    Cardiovascular: Normal rate, regular rhythm, S1 normal and S2 normal.  Exam reveals no gallop and no friction rub.   No murmur  heard. Pulmonary/Chest: Effort normal and breath sounds normal. No respiratory distress. She exhibits no tenderness.  Abdominal: Soft. Normal appearance and bowel sounds are normal. There is no hepatosplenomegaly. There is no tenderness. There is no rebound, no guarding, no tenderness at McBurney's point and negative Murphy's sign. No hernia.  Musculoskeletal: Normal range of motion.  Neurological: She is alert and oriented to person, place, and time. She has normal strength. No cranial nerve deficit or sensory deficit. Coordination normal. GCS eye subscore is 4. GCS verbal subscore is 5. GCS motor subscore is 6.  Skin: Skin is warm, dry and intact. No rash noted. No cyanosis.  Psychiatric: She has a normal mood and affect. Her speech is normal and behavior is normal. Thought content normal.    ED Course  Procedures (including critical care time)  Labs Reviewed - No data to display No results found.   Diagnosis: 1. Torticollis 2. Migraine headache    MDM  Patient presents to the ER with complaints of left-sided neck pain and migraine headache. Patient has significant spasm of the paraspinal muscles on the left side of her neck. This is consistent with torticollis. There has not been any injury and no sign of meningitis. This is likely transformed into a migraine headache. She has used her normal migraine medications at home without improvement. Patient treated with a combination of Valium for the torticollis and Phenergan and Benadryl for the migraine headache. Follow up with her neurologist tomorrow.        Gilda Crease, MD 02/27/12 [phone]

## 2012-04-13 ENCOUNTER — Encounter (HOSPITAL_COMMUNITY): Payer: Self-pay | Admitting: *Deleted

## 2012-04-13 ENCOUNTER — Emergency Department (HOSPITAL_COMMUNITY)
Admission: EM | Admit: 2012-04-13 | Discharge: 2012-04-13 | Disposition: A | Discharge status: Home / Self Care | Payer: Medicaid Other | Attending: Emergency Medicine | Admitting: Emergency Medicine

## 2012-04-13 DIAGNOSIS — F17200 Nicotine dependence, unspecified, uncomplicated: Secondary | ICD-10-CM | POA: Insufficient documentation

## 2012-04-13 DIAGNOSIS — Z79899 Other long term (current) drug therapy: Secondary | ICD-10-CM | POA: Insufficient documentation

## 2012-04-13 DIAGNOSIS — R42 Dizziness and giddiness: Secondary | ICD-10-CM | POA: Insufficient documentation

## 2012-04-13 DIAGNOSIS — R112 Nausea with vomiting, unspecified: Secondary | ICD-10-CM | POA: Insufficient documentation

## 2012-04-13 DIAGNOSIS — I10 Essential (primary) hypertension: Secondary | ICD-10-CM | POA: Insufficient documentation

## 2012-04-13 DIAGNOSIS — Z8742 Personal history of other diseases of the female genital tract: Secondary | ICD-10-CM | POA: Insufficient documentation

## 2012-04-13 DIAGNOSIS — H53149 Visual discomfort, unspecified: Secondary | ICD-10-CM | POA: Insufficient documentation

## 2012-04-13 DIAGNOSIS — J45909 Unspecified asthma, uncomplicated: Secondary | ICD-10-CM | POA: Insufficient documentation

## 2012-04-13 DIAGNOSIS — G43909 Migraine, unspecified, not intractable, without status migrainosus: Secondary | ICD-10-CM | POA: Insufficient documentation

## 2012-04-13 MED ORDER — HYDROCODONE-ACETAMINOPHEN 5-325 MG PO TABS: 1.0000 | ORAL_TABLET | ORAL | Status: DC | PRN

## 2012-04-13 MED ORDER — SODIUM CHLORIDE 0.9 % IV SOLN
Freq: Once | INTRAVENOUS | Status: AC
  Administered 2012-04-13: 16:00:00 via INTRAVENOUS

## 2012-04-13 MED ORDER — CYCLOBENZAPRINE HCL 10 MG PO TABS
10.0000 mg | ORAL_TABLET | Freq: Once | ORAL | Status: AC
  Administered 2012-04-13: 10 mg via ORAL
  Filled 2012-04-13: qty 1

## 2012-04-13 MED ORDER — PROMETHAZINE HCL 25 MG PO TABS: 25.0000 mg | ORAL_TABLET | Freq: Four times a day (QID) | ORAL | Status: DC | PRN

## 2012-04-13 MED ORDER — DEXAMETHASONE SODIUM PHOSPHATE 4 MG/ML IJ SOLN
10.0000 mg | Freq: Once | INTRAMUSCULAR | Status: AC
  Administered 2012-04-13: 10 mg via INTRAVENOUS
  Filled 2012-04-13: qty 3

## 2012-04-13 MED ORDER — ONDANSETRON HCL 4 MG/2ML IJ SOLN
4.0000 mg | Freq: Once | INTRAMUSCULAR | Status: AC
  Administered 2012-04-13: 4 mg via INTRAVENOUS
  Filled 2012-04-13: qty 2

## 2012-04-13 MED ORDER — DIPHENHYDRAMINE HCL 50 MG/ML IJ SOLN
25.0000 mg | Freq: Once | INTRAMUSCULAR | Status: AC
  Administered 2012-04-13: 25 mg via INTRAVENOUS
  Filled 2012-04-13: qty 1

## 2012-04-13 MED ORDER — CYCLOBENZAPRINE HCL 10 MG PO TABS: 10.0000 mg | ORAL_TABLET | Freq: Two times a day (BID) | ORAL | Status: DC | PRN

## 2012-04-13 NOTE — ED Provider Notes (Signed)
History     CSN: [id number]  Arrival date & time 04/13/12  1512   First MD Initiated Contact with Patient 04/13/12 1610      Chief Complaint  Patient presents with  . Headache    (Consider location/radiation/quality/duration/timing/severity/associated sxs/prior treatment) Patient is a 27 y.o. female presenting with headaches. The history is provided by the patient.  Headache Pain location:  Frontal Radiates to:  L neck and R neck Severity currently:  9/10 Severity at highest:  9/10 Onset quality:  Gradual Duration:  3 days Timing:  Constant Progression:  Worsening Chronicity:  Recurrent Relieved by:  Resting in a darkened room Worsened by:  Activity, light and neck movement Associated symptoms: dizziness, facial pain, nausea, photophobia and vomiting   Associated symptoms: no abdominal pain, no blurred vision, no congestion, no fever and no neck stiffness     Past Medical History  Diagnosis Date  . Hypertension   . Migraine   . Asthma   . Pelvic inflammatory disease (PID)     Past Surgical History  Procedure Laterality Date  . Tubal ligation    . Kidney infections      Family History  Problem Relation Age of Onset  . Hypertension Mother   . Diabetes Mother   . Heart failure Father     History  Substance Use Topics  . Smoking status: Current Some Day Smoker    Types: Cigarettes  . Smokeless tobacco: Never Used  . Alcohol Use: Yes     Comment: occasionally-once a month    OB History   Grav Para Term Preterm Abortions TAB SAB Ect Mult Living   4 4 4       4       Review of Systems  Constitutional: Negative for fever.  HENT: Negative for congestion and neck stiffness.   Eyes: Positive for photophobia. Negative for blurred vision.  Respiratory: Negative for chest tightness.   Cardiovascular: Negative for chest pain and palpitations.  Gastrointestinal: Positive for nausea and vomiting. Negative for abdominal pain.  Skin: Negative for rash.   Allergic/Immunologic: Negative for immunocompromised state.  Neurological: Positive for dizziness and headaches.  Psychiatric/Behavioral: Negative for confusion. The patient is not nervous/anxious.     Allergies  Aspirin; Doxycycline; Hydromorphone hcl; Ibuprofen; Metoclopramide; Other; Shellfish allergy; Toradol; and Latex  Home Medications   Current Outpatient Rx  Name  Route  Sig  Dispense  Refill  . albuterol (PROVENTIL HFA;VENTOLIN HFA) 108 (90 BASE) MCG/ACT inhaler   Inhalation   Inhale 2 puffs into the lungs once.   1 Inhaler   1     With spacer.   Marland Kitchen amitriptyline (ELAVIL) 50 MG tablet   Oral   Take 50 mg by mouth at bedtime.         . cyclobenzaprine (FLEXERIL) 10 MG tablet   Oral   Take 1 tablet (10 mg total) by mouth 2 (two) times daily as needed for muscle spasms.   20 tablet   0   . propranolol (INDERAL) 80 MG tablet   Oral   Take 80 mg by mouth at bedtime.         Marland Kitchen zolmitriptan (ZOMIG) 5 MG tablet   Oral   Take 5 mg by mouth as needed. Migraines, may take every 2 hours if needed, not exceeding 2 in a 24 hour period           BP 124/82  Pulse 68  Temp(Src) 98.4 F (36.9 C) (Oral)  Resp 20  Ht 5' (1.524 m)  Wt 150 lb (68.04 kg)  BMI 29.3 kg/m2  SpO2 100%  LMP 04/09/2012  Physical Exam  Nursing note and vitals reviewed. Constitutional: She is oriented to person, place, and time. She appears well-developed and well-nourished.  HENT:  Head: Normocephalic and atraumatic.  Eyes: Conjunctivae and EOM are normal. Pupils are equal, round, and reactive to light.  Neck: Normal range of motion. Neck supple.  Cardiovascular: Normal rate and regular rhythm.   Pulmonary/Chest: Effort normal and breath sounds normal.  Abdominal: Soft. There is no tenderness.  Musculoskeletal: Normal range of motion. She exhibits no edema.  Neurological: She is alert and oriented to person, place, and time. She has normal strength and normal reflexes. No cranial nerve  deficit or sensory deficit. She displays a negative Romberg sign.  Ambulatory with steady gait. Stands on one foot without difficulty.   Skin: Skin is warm and dry.  Psychiatric: She has a normal mood and affect. Her behavior is normal. Judgment and thought content normal.   Assessment: 27 y.o. female with headache, hx of migranes    Plan:  IV hydration   Zofran, Benadryl, Decadron, IV   Flexeril PO       ED Course  Procedures (including critical care time) 18:00 patient feeling much better, ready to go home. States Flexeril helped the tight muscles and helped her relax. She denies nausea at this time. She request medication for home until her follow up with there doctor at [facility].  She uses Phenergan for nausea and flexeril for muscle spasm.   MDM  I have reviewed this patient's vital signs, nurses notes and discussed plan of care with the patient. She is feeling much better and stable for discharge home.    Medication List    TAKE these medications       cyclobenzaprine 10 MG tablet  Commonly known as:  FLEXERIL  Take 1 tablet (10 mg total) by mouth 2 (two) times daily as needed for muscle spasms.     HYDROcodone-acetaminophen 5-325 MG per tablet  Commonly known as:  NORCO/VICODIN  Take 1 tablet by mouth every 4 (four) hours as needed.     promethazine 25 MG tablet  Commonly known as:  PHENERGAN  Take 1 tablet (25 mg total) by mouth every 6 (six) hours as needed for nausea.      ASK your doctor about these medications       acetaminophen 500 MG tablet  Commonly known as:  TYLENOL  Take 500-1,000 mg by mouth daily as needed for pain.     albuterol 108 (90 BASE) MCG/ACT inhaler  Commonly known as:  PROVENTIL HFA;VENTOLIN HFA  Inhale 2 puffs into the lungs once.     amitriptyline 50 MG tablet  Commonly known as:  ELAVIL  Take 50 mg by mouth at bedtime.     propranolol 80 MG tablet  Commonly known as:  INDERAL  Take 80 mg by mouth 2 (two) times daily.      zolmitriptan 5 MG tablet  Commonly known as:  ZOMIG  Take 5 mg by mouth as needed. Migraines, may take every 2 hours if needed, not exceeding 2 in a 24 hour period               [facility], NP 04/13/12 1814

## 2012-04-13 NOTE — ED Notes (Signed)
Headache with vomiting for 3 days, hx of same, No injury

## 2012-04-14 NOTE — ED Provider Notes (Signed)
Medical screening examination/treatment/procedure(s) were performed by non-physician practitioner and as supervising physician I was immediately available for consultation/collaboration.   Carleene Cooper III, MD 04/14/12 1124

## 2012-04-17 ENCOUNTER — Other Ambulatory Visit: Payer: Self-pay | Admitting: Diagnostic Neuroimaging

## 2012-04-17 ENCOUNTER — Telehealth: Payer: Self-pay

## 2012-04-17 MED ORDER — BUTALBITAL-APAP-CAFFEINE 50-325-40 MG PO TABS: 1.0000 | ORAL_TABLET | Freq: Two times a day (BID) | ORAL | Status: DC | PRN

## 2012-04-17 NOTE — Telephone Encounter (Signed)
I will rx 30tabs, no refills. -VRP

## 2012-04-17 NOTE — Telephone Encounter (Signed)
Patient called requesting we prescribe Fioricet for Migraines.  Please advise.  Thank you.

## 2012-06-06 ENCOUNTER — Emergency Department (HOSPITAL_COMMUNITY): Payer: Medicaid Other

## 2012-06-06 ENCOUNTER — Emergency Department (HOSPITAL_COMMUNITY)
Admission: EM | Admit: 2012-06-06 | Discharge: 2012-06-06 | Disposition: A | Discharge status: Home / Self Care | Payer: Medicaid Other | Attending: Emergency Medicine | Admitting: Emergency Medicine

## 2012-06-06 ENCOUNTER — Encounter (HOSPITAL_COMMUNITY): Payer: Self-pay

## 2012-06-06 DIAGNOSIS — Z3202 Encounter for pregnancy test, result negative: Secondary | ICD-10-CM | POA: Insufficient documentation

## 2012-06-06 DIAGNOSIS — Z8742 Personal history of other diseases of the female genital tract: Secondary | ICD-10-CM | POA: Insufficient documentation

## 2012-06-06 DIAGNOSIS — E876 Hypokalemia: Secondary | ICD-10-CM | POA: Insufficient documentation

## 2012-06-06 DIAGNOSIS — B9689 Other specified bacterial agents as the cause of diseases classified elsewhere: Secondary | ICD-10-CM

## 2012-06-06 DIAGNOSIS — N760 Acute vaginitis: Secondary | ICD-10-CM | POA: Insufficient documentation

## 2012-06-06 DIAGNOSIS — Z79899 Other long term (current) drug therapy: Secondary | ICD-10-CM | POA: Insufficient documentation

## 2012-06-06 DIAGNOSIS — I10 Essential (primary) hypertension: Secondary | ICD-10-CM | POA: Insufficient documentation

## 2012-06-06 DIAGNOSIS — F17200 Nicotine dependence, unspecified, uncomplicated: Secondary | ICD-10-CM | POA: Insufficient documentation

## 2012-06-06 DIAGNOSIS — R109 Unspecified abdominal pain: Secondary | ICD-10-CM | POA: Insufficient documentation

## 2012-06-06 DIAGNOSIS — Z91040 Latex allergy status: Secondary | ICD-10-CM | POA: Insufficient documentation

## 2012-06-06 DIAGNOSIS — J45909 Unspecified asthma, uncomplicated: Secondary | ICD-10-CM | POA: Insufficient documentation

## 2012-06-06 DIAGNOSIS — R112 Nausea with vomiting, unspecified: Secondary | ICD-10-CM | POA: Insufficient documentation

## 2012-06-06 DIAGNOSIS — Z87448 Personal history of other diseases of urinary system: Secondary | ICD-10-CM | POA: Insufficient documentation

## 2012-06-06 DIAGNOSIS — Z9851 Tubal ligation status: Secondary | ICD-10-CM | POA: Insufficient documentation

## 2012-06-06 DIAGNOSIS — G43909 Migraine, unspecified, not intractable, without status migrainosus: Secondary | ICD-10-CM | POA: Insufficient documentation

## 2012-06-06 LAB — CBC WITH DIFFERENTIAL/PLATELET
Basophils Relative: 1 % (ref 0–1)
Eosinophils Absolute: 0.3 10*3/uL (ref 0.0–0.7)
Lymphs Abs: 3 10*3/uL (ref 0.7–4.0)
MCH: 28.5 pg (ref 26.0–34.0)
Neutro Abs: 2.7 10*3/uL (ref 1.7–7.7)
Neutrophils Relative %: 40 % — ABNORMAL LOW (ref 43–77)
Platelets: 230 10*3/uL (ref 150–400)
RBC: 4.24 MIL/uL (ref 3.87–5.11)

## 2012-06-06 LAB — URINALYSIS, ROUTINE W REFLEX MICROSCOPIC
Glucose, UA: NEGATIVE mg/dL
Ketones, ur: 40 mg/dL — AB
Leukocytes, UA: NEGATIVE
pH: 7.5 (ref 5.0–8.0)

## 2012-06-06 LAB — COMPREHENSIVE METABOLIC PANEL
AST: 14 U/L (ref 0–37)
Albumin: 3.9 g/dL (ref 3.5–5.2)
GFR calc Af Amer: >90 mL/min (ref >90)
GFR calc non Af Amer: >90 mL/min (ref >90)
Glucose, Bld: 89 mg/dL (ref 70–99)
Potassium: 3.3 mEq/L — ABNORMAL LOW (ref 3.5–5.1)
Sodium: 141 mEq/L (ref 135–145)
Total Bilirubin: 0.3 mg/dL (ref 0.3–1.2)
Total Protein: 7.3 g/dL (ref 6.0–8.3)

## 2012-06-06 LAB — URINE MICROSCOPIC-ADD ON

## 2012-06-06 LAB — WET PREP, GENITAL: Trich, Wet Prep: NONE SEEN

## 2012-06-06 LAB — LIPASE, BLOOD: Lipase: 31 U/L (ref 11–59)

## 2012-06-06 MED ORDER — ONDANSETRON HCL 4 MG PO TABS: 4.0000 mg | ORAL_TABLET | Freq: Four times a day (QID) | ORAL | Status: DC

## 2012-06-06 MED ORDER — ONDANSETRON HCL 4 MG/2ML IJ SOLN
4.0000 mg | Freq: Once | INTRAMUSCULAR | Status: AC
  Administered 2012-06-06: 4 mg via INTRAVENOUS
  Filled 2012-06-06: qty 2

## 2012-06-06 MED ORDER — METRONIDAZOLE 500 MG PO TABS: 500.0000 mg | ORAL_TABLET | Freq: Two times a day (BID) | ORAL | Status: DC

## 2012-06-06 MED ORDER — MORPHINE SULFATE 4 MG/ML IJ SOLN
4.0000 mg | Freq: Once | INTRAMUSCULAR | Status: AC
  Administered 2012-06-06: 4 mg via INTRAVENOUS
  Filled 2012-06-06: qty 1

## 2012-06-06 MED ORDER — ONDANSETRON HCL 4 MG/2ML IJ SOLN
INTRAMUSCULAR | Status: AC
  Filled 2012-06-06: qty 2

## 2012-06-06 MED ORDER — POTASSIUM CHLORIDE CRYS ER 20 MEQ PO TBCR
20.0000 meq | EXTENDED_RELEASE_TABLET | Freq: Once | ORAL | Status: AC
  Administered 2012-06-06: 20 meq via ORAL
  Filled 2012-06-06: qty 1

## 2012-06-06 MED ORDER — HYDROCODONE-ACETAMINOPHEN 5-325 MG PO TABS: 1.0000 | ORAL_TABLET | ORAL | Status: DC | PRN

## 2012-06-06 MED ORDER — ONDANSETRON HCL 4 MG/2ML IJ SOLN
4.0000 mg | Freq: Once | INTRAMUSCULAR | Status: AC
  Administered 2012-06-06: 4 mg via INTRAVENOUS

## 2012-06-06 NOTE — ED Notes (Signed)
Pelvic exam completed w. rn present, specimen sent to lab. Pt tearful during exam

## 2012-06-06 NOTE — ED Notes (Signed)
Pt given warm blanket and footies per request, awaiting ultrasound.

## 2012-06-06 NOTE — ED Notes (Signed)
Pt reports bil lower quad ab pain for several days. Pain is worse today. Denies fever. Denies any urinary s/s

## 2012-06-06 NOTE — ED Provider Notes (Signed)
History     CSN: [id number]  Arrival date & time 06/06/12  1055   First MD Initiated Contact with Patient 06/06/12 1127      Chief Complaint  Patient presents with  . Abdominal Pain    (Consider location/radiation/quality/duration/timing/severity/associated sxs/prior treatment) HPI Comments: El Tanya Krause is a 27 y.o. Female presenting with lower abdominal pain which started 3 days ago and has become progressively more severe today.  She describes sharp intermittent stabbing pain which is worse in her left lower quadrant,  But radiates to her suprapubic and her left upper abdominal area.  She denies fever,  But has had constant nausea with emesis with any oral intake.  She denies dysuria, hematuria, diarrhea, constipation, vaginal discharge.  Pain is constant and she has not found any alleviators.  Palpation and movement makes her pain worse as does lying on her left side.  She feels bloated and constipated although her last bm was this morning, and did not relieve her pain.  She denies blood in her stools.  She has had intermittent episodes of this same left sided pain and has been evaluated for her symptoms here previously.     The history is provided by the patient.    Past Medical History  Diagnosis Date  . Hypertension   . Migraine   . Asthma   . Pelvic inflammatory disease (PID)     Past Surgical History  Procedure Laterality Date  . Tubal ligation    . Kidney infections      Family History  Problem Relation Age of Onset  . Hypertension Mother   . Diabetes Mother   . Heart failure Father     History  Substance Use Topics  . Smoking status: Current Some Day Smoker    Types: Cigarettes  . Smokeless tobacco: Never Used  . Alcohol Use: Yes     Comment: occasionally-once a month    OB History   Grav Para Term Preterm Abortions TAB SAB Ect Mult Living   4 4 4       4       Review of Systems  Constitutional: Negative for fever and chills.  HENT: Negative  for congestion, sore throat and neck pain.   Eyes: Negative.   Respiratory: Negative for chest tightness and shortness of breath.   Cardiovascular: Negative for chest pain.  Gastrointestinal: Positive for nausea, vomiting and abdominal pain. Negative for diarrhea.  Genitourinary: Negative.  Negative for dysuria, frequency, hematuria, flank pain and vaginal discharge.  Musculoskeletal: Negative for joint swelling and arthralgias.  Skin: Negative.  Negative for rash and wound.  Neurological: Negative for dizziness, weakness, light-headedness, numbness and headaches.  Psychiatric/Behavioral: Negative.     Allergies  Aspirin; Doxycycline; Hydromorphone hcl; Ibuprofen; Metoclopramide; Other; Shellfish allergy; Toradol; and Latex  Home Medications   Current Outpatient Rx  Name  Route  Sig  Dispense  Refill  . acetaminophen (TYLENOL) 500 MG tablet   Oral   Take 500-1,000 mg by mouth daily as needed for pain.         Marland Kitchen albuterol (PROVENTIL HFA;VENTOLIN HFA) 108 (90 BASE) MCG/ACT inhaler   Inhalation   Inhale 2 puffs into the lungs once.   1 Inhaler   1     With spacer.   Marland Kitchen amitriptyline (ELAVIL) 50 MG tablet   Oral   Take 50 mg by mouth at bedtime.         . butalbital-acetaminophen-caffeine (FIORICET, ESGIC) 50-325-40 MG per tablet   Oral  Take 1 tablet by mouth 2 (two) times daily as needed for headache.   30 tablet   0   . cyclobenzaprine (FLEXERIL) 10 MG tablet   Oral   Take 1 tablet (10 mg total) by mouth 2 (two) times daily as needed for muscle spasms.   20 tablet   0   . propranolol (INDERAL) 80 MG tablet   Oral   Take 80 mg by mouth 2 (two) times daily.          Marland Kitchen zolmitriptan (ZOMIG) 5 MG tablet   Oral   Take 5 mg by mouth as needed. Migraines, may take every 2 hours if needed, not exceeding 2 in a 24 hour period         . HYDROcodone-acetaminophen (NORCO/VICODIN) 5-325 MG per tablet   Oral   Take 1 tablet by mouth every 4 (four) hours as needed for  pain.   15 tablet   0   . metroNIDAZOLE (FLAGYL) 500 MG tablet   Oral   Take 1 tablet (500 mg total) by mouth 2 (two) times daily.   14 tablet   0   . ondansetron (ZOFRAN) 4 MG tablet   Oral   Take 1 tablet (4 mg total) by mouth every 6 (six) hours.   12 tablet   0     BP 131/83  Pulse 60  Temp(Src) 98.5 F (36.9 C) (Oral)  Resp 18  Ht 5' (1.524 m)  Wt 149 lb (67.586 kg)  BMI 29.1 kg/m2  SpO2 100%  LMP 05/30/2012  Physical Exam  Nursing note and vitals reviewed. Constitutional: She appears well-developed and well-nourished.  HENT:  Head: Normocephalic and atraumatic.  Eyes: Conjunctivae are normal.  Neck: Normal range of motion.  Cardiovascular: Normal rate, regular rhythm, normal heart sounds and intact distal pulses.   Pulmonary/Chest: Effort normal and breath sounds normal. She has no wheezes.  Abdominal: Soft. Bowel sounds are normal. She exhibits no mass. There is tenderness in the suprapubic area, left upper quadrant and left lower quadrant. There is no rebound, no guarding, no CVA tenderness, no tenderness at McBurney's point and negative Murphy's sign.  Abdominal pain improves with distraction.  Genitourinary: Vagina normal. Uterus is not enlarged and not tender. Cervix exhibits no motion tenderness, no discharge and no friability. Right adnexum displays no mass, no tenderness and no fullness. Left adnexum displays tenderness. Left adnexum displays no mass and no fullness.  Musculoskeletal: Normal range of motion.  Neurological: She is alert.  Skin: Skin is warm and dry.  Psychiatric: She has a normal mood and affect.    ED Course  Procedures (including critical care time)  Labs Reviewed  WET PREP, GENITAL - Abnormal; Notable for the following:    Clue Cells Wet Prep HPF POC FEW (*)    WBC, Wet Prep HPF POC FEW (*)    All other components within normal limits  URINALYSIS, ROUTINE W REFLEX MICROSCOPIC - Abnormal; Notable for the following:    Bilirubin  Urine SMALL (*)    Ketones, ur 40 (*)    Protein, ur TRACE (*)    All other components within normal limits  URINE MICROSCOPIC-ADD ON - Abnormal; Notable for the following:    Squamous Epithelial / LPF FEW (*)    Bacteria, UA FEW (*)    All other components within normal limits  CBC WITH DIFFERENTIAL - Abnormal; Notable for the following:    HCT 34.2 (*)    Neutrophils Relative % 40 (*)  All other components within normal limits  COMPREHENSIVE METABOLIC PANEL - Abnormal; Notable for the following:    Potassium 3.3 (*)    All other components within normal limits  GC/CHLAMYDIA PROBE AMP  PREGNANCY, URINE  LIPASE, BLOOD   US Transvaginal Non-ob  06/06/2012   *RADIOLOGY REPORT*  Clinical Data: Abdominal pain left side, history pelvic inflammatory disease  TRANSABDOMINAL AND TRANSVAGINAL ULTRASOUND OF PELVIS Technique:  Both transabdominal and transvaginal ultrasound examinations of the pelvis were performed. Transabdominal technique was performed for global imaging of the pelvis including uterus, ovaries, adnexal regions, and pelvic cul-de-sac.  It was necessary to proceed with endovaginal exam following the transabdominal exam to visualize and characterize the left ovary.  Comparison:  12/30/2011  Findings:  Uterus: 8.2 x 4.0 x 5.3 cm.  Normal morphology without mass.  Endometrium: 5 mm thick, normal.  No endometrial fluid.  Right ovary:  3.0 x 1.6 x 2.1 cm.  Normal morphology without mass. Internal blood flow present on color Doppler imaging.  Left ovary: 3.4 x 2.1 x 3.0 cm.  Normal morphology without mass. Internal blood flow present on color Doppler imaging.  Other findings: No free pelvic fluid or adnexal masses.  IMPRESSION: Unremarkable pelvic sonography.   Original Report Authenticated By: Ulyses Southward, M.D.   US Pelvis Complete  06/06/2012   *RADIOLOGY REPORT*  Clinical Data: Abdominal pain left side, history pelvic inflammatory disease  TRANSABDOMINAL AND TRANSVAGINAL ULTRASOUND OF  PELVIS Technique:  Both transabdominal and transvaginal ultrasound examinations of the pelvis were performed. Transabdominal technique was performed for global imaging of the pelvis including uterus, ovaries, adnexal regions, and pelvic cul-de-sac.  It was necessary to proceed with endovaginal exam following the transabdominal exam to visualize and characterize the left ovary.  Comparison:  12/30/2011  Findings:  Uterus: 8.2 x 4.0 x 5.3 cm.  Normal morphology without mass.  Endometrium: 5 mm thick, normal.  No endometrial fluid.  Right ovary:  3.0 x 1.6 x 2.1 cm.  Normal morphology without mass. Internal blood flow present on color Doppler imaging.  Left ovary: 3.4 x 2.1 x 3.0 cm.  Normal morphology without mass. Internal blood flow present on color Doppler imaging.  Other findings: No free pelvic fluid or adnexal masses.  IMPRESSION: Unremarkable pelvic sonography.   Original Report Authenticated By: Ulyses Southward, M.D.   Dg Abd Acute W/chest  06/06/2012   *RADIOLOGY REPORT*  Clinical Data: Left abdominal pain and swelling for several days, history hypertension, asthma  ACUTE ABDOMEN SERIES (ABDOMEN 2 VIEW & CHEST 1 VIEW)  Comparison: Chest radiograph 01/14/2012, abdominal radiographs 10/10/2011  Findings: Normal heart size, mediastinal contours, and pulmonary vascularity. Lungs clear. No pleural effusion or pneumothorax. Normal bowel gas pattern. No bowel dilatation, bowel wall thickening, or free intraperitoneal air. Bones unremarkable. No urinary tract calcification.  IMPRESSION: No acute abnormalities.   Original Report Authenticated By: Ulyses Southward, M.D.     1. Abdominal pain   2. Hypokalemia   3. Bacterial vaginosis       MDM  Pt was discussed with Dr Manus Gunning who also saw patient. History of intermittent episodes of left sided abdominal pain with previous negative workups including negative CT scan and pelvic US (12/13).  Labs and [place],  Acute abd series today without acute findings.  She has been  referred to Dr. Karilyn Cota for further eval of her chronic abdominal pain.  She was prescribed a small quantity of hydrocodone,  zofran for nausea,  Also prescribed flagyl with few clue cells on  wet prep,  Doubt this as source of pain.        Burgess Amor, PA-C 06/06/12 1756

## 2012-06-06 NOTE — ED Notes (Signed)
Given another ginger ale to drink.  Tolerated meds well.

## 2012-06-06 NOTE — ED Notes (Signed)
Pt given ginger ale to drink per request. Pa idol to bedside of pt.

## 2012-06-06 NOTE — ED Notes (Signed)
Complain of abdominal pain that does into back. Vomiting

## 2012-06-06 NOTE — ED Notes (Signed)
Pt requested that staff call her boyfriend quentin at [phone]. Message left on the cell phone. Pt made aware

## 2012-06-06 NOTE — ED Provider Notes (Signed)
Medical screening examination/treatment/procedure(s) were conducted as a shared visit with non-physician practitioner(s) and myself.  I personally evaluated the patient during the encounter  2 days of diffuse abdominal pain worse in the lower quadrants.  Negative CT and [place] in Dec 2013. Abdomen soft, diffuse tenderness but easily distractable. No pain at McBurney's point.  Glynn Octave, MD 06/06/12 [phone]

## 2012-06-07 LAB — GC/CHLAMYDIA PROBE AMP: CT Probe RNA: NEGATIVE

## 2012-06-08 ENCOUNTER — Emergency Department (HOSPITAL_COMMUNITY)
Admission: EM | Admit: 2012-06-08 | Discharge: 2012-06-08 | Disposition: A | Discharge status: Home / Self Care | Payer: Medicaid Other | Attending: Emergency Medicine | Admitting: Emergency Medicine

## 2012-06-08 ENCOUNTER — Encounter (HOSPITAL_COMMUNITY): Payer: Self-pay | Admitting: *Deleted

## 2012-06-08 DIAGNOSIS — I10 Essential (primary) hypertension: Secondary | ICD-10-CM | POA: Insufficient documentation

## 2012-06-08 DIAGNOSIS — Z9851 Tubal ligation status: Secondary | ICD-10-CM | POA: Insufficient documentation

## 2012-06-08 DIAGNOSIS — Z79899 Other long term (current) drug therapy: Secondary | ICD-10-CM | POA: Insufficient documentation

## 2012-06-08 DIAGNOSIS — R112 Nausea with vomiting, unspecified: Secondary | ICD-10-CM | POA: Insufficient documentation

## 2012-06-08 DIAGNOSIS — R109 Unspecified abdominal pain: Secondary | ICD-10-CM

## 2012-06-08 DIAGNOSIS — R42 Dizziness and giddiness: Secondary | ICD-10-CM | POA: Insufficient documentation

## 2012-06-08 DIAGNOSIS — Z8742 Personal history of other diseases of the female genital tract: Secondary | ICD-10-CM | POA: Insufficient documentation

## 2012-06-08 DIAGNOSIS — R0602 Shortness of breath: Secondary | ICD-10-CM | POA: Insufficient documentation

## 2012-06-08 DIAGNOSIS — J45909 Unspecified asthma, uncomplicated: Secondary | ICD-10-CM | POA: Insufficient documentation

## 2012-06-08 DIAGNOSIS — F17200 Nicotine dependence, unspecified, uncomplicated: Secondary | ICD-10-CM | POA: Insufficient documentation

## 2012-06-08 DIAGNOSIS — R1013 Epigastric pain: Secondary | ICD-10-CM | POA: Insufficient documentation

## 2012-06-08 DIAGNOSIS — G43909 Migraine, unspecified, not intractable, without status migrainosus: Secondary | ICD-10-CM | POA: Insufficient documentation

## 2012-06-08 DIAGNOSIS — Z91040 Latex allergy status: Secondary | ICD-10-CM | POA: Insufficient documentation

## 2012-06-08 MED ORDER — GI COCKTAIL ~~LOC~~
30.0000 mL | Freq: Once | ORAL | Status: AC
  Administered 2012-06-08: 30 mL via ORAL
  Filled 2012-06-08: qty 30

## 2012-06-08 MED ORDER — OXYCODONE-ACETAMINOPHEN 5-325 MG PO TABS
1.0000 | ORAL_TABLET | Freq: Once | ORAL | Status: AC
  Administered 2012-06-08: 1 via ORAL
  Filled 2012-06-08: qty 1

## 2012-06-08 MED ORDER — LORAZEPAM 1 MG PO TABS
1.0000 mg | ORAL_TABLET | Freq: Once | ORAL | Status: AC
  Administered 2012-06-08: 1 mg via ORAL
  Filled 2012-06-08: qty 1

## 2012-06-08 MED ORDER — PANTOPRAZOLE SODIUM 40 MG PO TBEC: 40.0000 mg | DELAYED_RELEASE_TABLET | Freq: Every day | ORAL | Status: DC

## 2012-06-08 MED ORDER — FAMOTIDINE 20 MG PO TABS
20.0000 mg | ORAL_TABLET | Freq: Once | ORAL | Status: AC
  Administered 2012-06-08: 20 mg via ORAL
  Filled 2012-06-08: qty 1

## 2012-06-08 MED ORDER — ONDANSETRON 8 MG PO TBDP
8.0000 mg | ORAL_TABLET | Freq: Once | ORAL | Status: AC
  Administered 2012-06-08: 8 mg via ORAL
  Filled 2012-06-08: qty 1

## 2012-06-08 NOTE — ED Provider Notes (Signed)
History     CSN: [id number]  Arrival date & time 06/08/12  2042   First MD Initiated Contact with Patient 06/08/12 2115     Chief complaint: abd pain  (Consider location/radiation/quality/duration/timing/severity/associated sxs/prior treatment) Patient is a 27 y.o. female presenting with abdominal pain and vomiting. The history is provided by the patient.  Abdominal Pain Associated symptoms include abdominal pain. Pertinent negatives include no chest pain, no headaches and no shortness of breath.  Emesis Associated symptoms: abdominal pain   Associated symptoms: no chills and no headaches   pt c/o epigastric/mid to upper abd pain for past week. Dull. Crampy. Constant. Denies specific exacerbating or alleviating factors. Not related to eating or certain types of foods. No radiating of pain. No back or flank pain. States hx similar abd pain in past, denies specific dx. Denies hx pud, gallstones or pancreatitis. No fever or chills. Having normal bms. No diarrhea or constipation. Nausea, single episode vomiting, not bloody or bilious. Denies lower abd pain. No vaginal discharge or bleeding. Having normal periods incl 2 weeks ago. No dysuria or flank pain. Pt denies any cp. No sob. No cough or uri c/o. Denies prior abd surgeryx tubal ligation. Denies acute or abrupt change in pain today, but persistence of same symptoms.     Past Medical History  Diagnosis Date  . Hypertension   . Migraine   . Asthma   . Pelvic inflammatory disease (PID)     Past Surgical History  Procedure Laterality Date  . Tubal ligation    . Kidney infections      Family History  Problem Relation Age of Onset  . Hypertension Mother   . Diabetes Mother   . Heart failure Father     History  Substance Use Topics  . Smoking status: Current Some Day Smoker    Types: Cigarettes  . Smokeless tobacco: Never Used  . Alcohol Use: Yes     Comment: occasionally-once a month    OB History   Grav Para Term  Preterm Abortions TAB SAB Ect Mult Living   4 4 4       4       Review of Systems  Constitutional: Negative for fever and chills.  HENT: Negative for neck pain.   Eyes: Negative for pain.  Respiratory: Negative for cough and shortness of breath.   Cardiovascular: Negative for chest pain.  Gastrointestinal: Positive for vomiting and abdominal pain.  Genitourinary: Negative for dysuria, flank pain, vaginal bleeding and vaginal discharge.  Musculoskeletal: Negative for back pain.  Skin: Negative for rash.  Neurological: Negative for headaches.  Hematological: Does not bruise/bleed easily.  Psychiatric/Behavioral: Negative for confusion.    Allergies  Aspirin; Doxycycline; Hydromorphone hcl; Ibuprofen; Metoclopramide; Other; Shellfish allergy; Toradol; and Latex  Home Medications   Current Outpatient Rx  Name  Route  Sig  Dispense  Refill  . amitriptyline (ELAVIL) 50 MG tablet   Oral   Take 50 mg by mouth at bedtime.         . butalbital-acetaminophen-caffeine (FIORICET, ESGIC) 50-325-40 MG per tablet   Oral   Take 1 tablet by mouth 2 (two) times daily as needed for headache.   30 tablet   0   . cyclobenzaprine (FLEXERIL) 10 MG tablet   Oral   Take 1 tablet (10 mg total) by mouth 2 (two) times daily as needed for muscle spasms.   20 tablet   0   . HYDROcodone-acetaminophen (NORCO/VICODIN) 5-325 MG per tablet   Oral  Take 1 tablet by mouth every 4 (four) hours as needed for pain.   15 tablet   0   . metroNIDAZOLE (FLAGYL) 500 MG tablet   Oral   Take 1 tablet (500 mg total) by mouth 2 (two) times daily.   14 tablet   0   . ondansetron (ZOFRAN) 4 MG tablet   Oral   Take 1 tablet (4 mg total) by mouth every 6 (six) hours.   12 tablet   0   . propranolol (INDERAL) 80 MG tablet   Oral   Take 80 mg by mouth 2 (two) times daily.          Marland Kitchen acetaminophen (TYLENOL) 500 MG tablet   Oral   Take 500-1,000 mg by mouth daily as needed for pain.         Marland Kitchen  albuterol (PROVENTIL HFA;VENTOLIN HFA) 108 (90 BASE) MCG/ACT inhaler   Inhalation   Inhale 2 puffs into the lungs once.   1 Inhaler   1     With spacer.   . zolmitriptan (ZOMIG) 5 MG tablet   Oral   Take 5 mg by mouth as needed. Migraines, may take every 2 hours if needed, not exceeding 2 in a 24 hour period           BP 133/87  Pulse 90  Temp(Src) 98.6 F (37 C) (Oral)  Resp 18  Ht 5' (1.524 m)  Wt 149 lb (67.586 kg)  BMI 29.1 kg/m2  SpO2 100%  LMP 05/30/2012  Physical Exam  Nursing note and vitals reviewed. Constitutional: She is oriented to person, place, and time. She appears well-developed and well-nourished. No distress.  HENT:  Mouth/Throat: Oropharynx is clear and moist.  Eyes: Conjunctivae are normal. No scleral icterus.  Neck: Neck supple. No tracheal deviation present.  Cardiovascular: Normal rate, regular rhythm, normal heart sounds and intact distal pulses.   Pulmonary/Chest: Effort normal and breath sounds normal. No respiratory distress.  Abdominal: Soft. Normal appearance and bowel sounds are normal. She exhibits no distension and no mass. There is no rebound and no guarding.  V mild epigastric tenderness  Genitourinary:  No cva tenderness  Musculoskeletal: She exhibits no edema.  Neurological: She is alert and oriented to person, place, and time.  Skin: Skin is warm and dry. No rash noted.  Psychiatric:  Mildly anxious    ED Course  Procedures (including critical care time)  Results for orders placed during the hospital encounter of 06/06/12  GC/CHLAMYDIA PROBE AMP      Result Value Range   CT Probe RNA NEGATIVE  NEGATIVE   GC Probe RNA NEGATIVE  NEGATIVE  WET PREP, GENITAL      Result Value Range   Yeast Wet Prep HPF POC NONE SEEN  NONE SEEN   Trich, Wet Prep NONE SEEN  NONE SEEN   Clue Cells Wet Prep HPF POC FEW (*) NONE SEEN   WBC, Wet Prep HPF POC FEW (*) NONE SEEN  URINALYSIS, ROUTINE W REFLEX MICROSCOPIC      Result Value Range    Color, Urine YELLOW  YELLOW   APPearance CLEAR  CLEAR   Specific Gravity, Urine 1.025  1.005 - 1.030   pH 7.5  5.0 - 8.0   Glucose, UA NEGATIVE  NEGATIVE mg/dL   Hgb urine dipstick NEGATIVE  NEGATIVE   Bilirubin Urine SMALL (*) NEGATIVE   Ketones, ur 40 (*) NEGATIVE mg/dL   Protein, ur TRACE (*) NEGATIVE mg/dL   Urobilinogen,  UA 1.0  0.0 - 1.0 mg/dL   Nitrite NEGATIVE  NEGATIVE   Leukocytes, UA NEGATIVE  NEGATIVE  PREGNANCY, URINE      Result Value Range   Preg Test, Ur NEGATIVE  NEGATIVE  URINE MICROSCOPIC-ADD ON      Result Value Range   Squamous Epithelial / LPF FEW (*) RARE   RBC / HPF 0-2  <3 RBC/hpf   Bacteria, UA FEW (*) RARE  CBC WITH DIFFERENTIAL      Result Value Range   WBC 6.7  4.0 - 10.5 K/uL   RBC 4.24  3.87 - 5.11 MIL/uL   Hemoglobin 12.1  12.0 - 15.0 g/dL   HCT 16.1 (*) 09.6 - 04.5 %   MCV 80.7  78.0 - 100.0 fL   MCH 28.5  26.0 - 34.0 pg   MCHC 35.4  30.0 - 36.0 g/dL   RDW 40.9  81.1 - 91.4 %   Platelets 230  150 - 400 K/uL   Neutrophils Relative % 40 (*) 43 - 77 %   Neutro Abs 2.7  1.7 - 7.7 K/uL   Lymphocytes Relative 44  12 - 46 %   Lymphs Abs 3.0  0.7 - 4.0 K/uL   Monocytes Relative 11  3 - 12 %   Monocytes Absolute 0.7  0.1 - 1.0 K/uL   Eosinophils Relative 4  0 - 5 %   Eosinophils Absolute 0.3  0.0 - 0.7 K/uL   Basophils Relative 1  0 - 1 %   Basophils Absolute 0.1  0.0 - 0.1 K/uL  COMPREHENSIVE METABOLIC PANEL      Result Value Range   Sodium 141  135 - 145 mEq/L   Potassium 3.3 (*) 3.5 - 5.1 mEq/L   Chloride 104  96 - 112 mEq/L   CO2 26  19 - 32 mEq/L   Glucose, Bld 89  70 - 99 mg/dL   BUN 6  6 - 23 mg/dL   Creatinine, Ser 7.82  0.50 - 1.10 mg/dL   Calcium 9.2  8.4 - 95.6 mg/dL   Total Protein 7.3  6.0 - 8.3 g/dL   Albumin 3.9  3.5 - 5.2 g/dL   AST 14  0 - 37 U/L   ALT 8  0 - 35 U/L   Alkaline Phosphatase 44  39 - 117 U/L   Total Bilirubin 0.3  0.3 - 1.2 mg/dL   GFR calc non Af Amer >90  >90 mL/min   GFR calc Af Amer >90  >90 mL/min   LIPASE, BLOOD      Result Value Range   Lipase 31  11 - 59 U/L   US Transvaginal Non-ob  06/06/2012   *RADIOLOGY REPORT*  Clinical Data: Abdominal pain left side, history pelvic inflammatory disease  TRANSABDOMINAL AND TRANSVAGINAL ULTRASOUND OF PELVIS Technique:  Both transabdominal and transvaginal ultrasound examinations of the pelvis were performed. Transabdominal technique was performed for global imaging of the pelvis including uterus, ovaries, adnexal regions, and pelvic cul-de-sac.  It was necessary to proceed with endovaginal exam following the transabdominal exam to visualize and characterize the left ovary.  Comparison:  12/30/2011  Findings:  Uterus: 8.2 x 4.0 x 5.3 cm.  Normal morphology without mass.  Endometrium: 5 mm thick, normal.  No endometrial fluid.  Right ovary:  3.0 x 1.6 x 2.1 cm.  Normal morphology without mass. Internal blood flow present on color Doppler imaging.  Left ovary: 3.4 x 2.1 x 3.0 cm.  Normal morphology without  mass. Internal blood flow present on color Doppler imaging.  Other findings: No free pelvic fluid or adnexal masses.  IMPRESSION: Unremarkable pelvic sonography.   Original Report Authenticated By: Ulyses Southward, M.D.   US Pelvis Complete  06/06/2012   *RADIOLOGY REPORT*  Clinical Data: Abdominal pain left side, history pelvic inflammatory disease  TRANSABDOMINAL AND TRANSVAGINAL ULTRASOUND OF PELVIS Technique:  Both transabdominal and transvaginal ultrasound examinations of the pelvis were performed. Transabdominal technique was performed for global imaging of the pelvis including uterus, ovaries, adnexal regions, and pelvic cul-de-sac.  It was necessary to proceed with endovaginal exam following the transabdominal exam to visualize and characterize the left ovary.  Comparison:  12/30/2011  Findings:  Uterus: 8.2 x 4.0 x 5.3 cm.  Normal morphology without mass.  Endometrium: 5 mm thick, normal.  No endometrial fluid.  Right ovary:  3.0 x 1.6 x 2.1 cm.  Normal  morphology without mass. Internal blood flow present on color Doppler imaging.  Left ovary: 3.4 x 2.1 x 3.0 cm.  Normal morphology without mass. Internal blood flow present on color Doppler imaging.  Other findings: No free pelvic fluid or adnexal masses.  IMPRESSION: Unremarkable pelvic sonography.   Original Report Authenticated By: Ulyses Southward, M.D.   Dg Abd Acute W/chest  06/06/2012   *RADIOLOGY REPORT*  Clinical Data: Left abdominal pain and swelling for several days, history hypertension, asthma  ACUTE ABDOMEN SERIES (ABDOMEN 2 VIEW & CHEST 1 VIEW)  Comparison: Chest radiograph 01/14/2012, abdominal radiographs 10/10/2011  Findings: Normal heart size, mediastinal contours, and pulmonary vascularity. Lungs clear. No pleural effusion or pneumothorax. Normal bowel gas pattern. No bowel dilatation, bowel wall thickening, or free intraperitoneal air. Bones unremarkable. No urinary tract calcification.  IMPRESSION: No acute abnormalities.   Original Report Authenticated By: Ulyses Southward, M.D.       MDM  Pt states has ride, does not have to drive. No meds pta.  pepcid and gi cocktail po, percocet 1 po for pain.  Reviewed nursing notes and prior charts for additional history.   Reviewed prior records including 2 days ago when pt indicates was seen for same symptoms - u/s and plain films neg acute.  Recent labs from 2 days ago unremarkable, including lipase, cmet, cbc, upreg and ua.   Pt with multiple ct scans abd over course of past 1-2 years, neg for acute process.  Admit 2012 for same symptoms, workup then including egd also negative/normal.  Pt has been referred to gi, states has appt in 2 weeks.   Pt w recent unremarkable lab/xr eval for same symptoms without acute clinical change since then. Pt afeb, normal vitals. No vomiting. abd soft nt .  Pt has gi f/u arranged.   Pt appears stable for d/c.  Instructed to return if worse, new symptoms, fevers, persistent vomiting, other concern.               Suzi Roots, MD 06/08/12 2138

## 2012-06-08 NOTE — ED Notes (Addendum)
Pt states she has upper abdominal pain, bloating, sob, and feeling lightheaded since last Sunday. Was seen here on Wednesday for the same. Now pt also c/o n/v.

## 2012-06-08 NOTE — ED Notes (Signed)
Patient requesting to speak with charge nurse about discharge condition. Charge nurse in to see patient.

## 2012-06-09 ENCOUNTER — Encounter (HOSPITAL_COMMUNITY): Payer: Self-pay | Admitting: *Deleted

## 2012-06-09 ENCOUNTER — Emergency Department (HOSPITAL_COMMUNITY)
Admission: EM | Admit: 2012-06-09 | Discharge: 2012-06-10 | Disposition: A | Discharge status: Home / Self Care | Payer: Medicaid Other | Attending: Emergency Medicine | Admitting: Emergency Medicine

## 2012-06-09 DIAGNOSIS — Z3202 Encounter for pregnancy test, result negative: Secondary | ICD-10-CM | POA: Insufficient documentation

## 2012-06-09 DIAGNOSIS — Z8742 Personal history of other diseases of the female genital tract: Secondary | ICD-10-CM | POA: Insufficient documentation

## 2012-06-09 DIAGNOSIS — G43909 Migraine, unspecified, not intractable, without status migrainosus: Secondary | ICD-10-CM | POA: Insufficient documentation

## 2012-06-09 DIAGNOSIS — Z91040 Latex allergy status: Secondary | ICD-10-CM | POA: Insufficient documentation

## 2012-06-09 DIAGNOSIS — F17200 Nicotine dependence, unspecified, uncomplicated: Secondary | ICD-10-CM | POA: Insufficient documentation

## 2012-06-09 DIAGNOSIS — R1084 Generalized abdominal pain: Secondary | ICD-10-CM | POA: Insufficient documentation

## 2012-06-09 DIAGNOSIS — I10 Essential (primary) hypertension: Secondary | ICD-10-CM | POA: Insufficient documentation

## 2012-06-09 DIAGNOSIS — R319 Hematuria, unspecified: Secondary | ICD-10-CM | POA: Insufficient documentation

## 2012-06-09 DIAGNOSIS — G8929 Other chronic pain: Secondary | ICD-10-CM | POA: Insufficient documentation

## 2012-06-09 DIAGNOSIS — Z9851 Tubal ligation status: Secondary | ICD-10-CM | POA: Insufficient documentation

## 2012-06-09 DIAGNOSIS — R112 Nausea with vomiting, unspecified: Secondary | ICD-10-CM | POA: Insufficient documentation

## 2012-06-09 DIAGNOSIS — N946 Dysmenorrhea, unspecified: Secondary | ICD-10-CM

## 2012-06-09 DIAGNOSIS — Z79899 Other long term (current) drug therapy: Secondary | ICD-10-CM | POA: Insufficient documentation

## 2012-06-09 DIAGNOSIS — J45909 Unspecified asthma, uncomplicated: Secondary | ICD-10-CM | POA: Insufficient documentation

## 2012-06-09 DIAGNOSIS — R079 Chest pain, unspecified: Secondary | ICD-10-CM | POA: Insufficient documentation

## 2012-06-09 LAB — URINALYSIS, ROUTINE W REFLEX MICROSCOPIC
Glucose, UA: NEGATIVE mg/dL
Ketones, ur: NEGATIVE mg/dL
Leukocytes, UA: NEGATIVE
Nitrite: NEGATIVE
Protein, ur: NEGATIVE mg/dL
Urobilinogen, UA: 0.2 mg/dL (ref 0.0–1.0)

## 2012-06-09 LAB — RAPID URINE DRUG SCREEN, HOSP PERFORMED
Barbiturates: POSITIVE — AB
Benzodiazepines: NOT DETECTED

## 2012-06-09 LAB — URINE MICROSCOPIC-ADD ON

## 2012-06-09 LAB — PREGNANCY, URINE: Preg Test, Ur: NEGATIVE

## 2012-06-09 MED ORDER — ACETAMINOPHEN 500 MG PO TABS
1000.0000 mg | ORAL_TABLET | Freq: Once | ORAL | Status: AC
  Administered 2012-06-09: 1000 mg via ORAL
  Filled 2012-06-09: qty 2

## 2012-06-09 MED ORDER — PROMETHAZINE HCL 25 MG/ML IJ SOLN
25.0000 mg | Freq: Once | INTRAMUSCULAR | Status: AC
  Administered 2012-06-09: 25 mg via INTRAVENOUS
  Filled 2012-06-09: qty 1

## 2012-06-09 MED ORDER — SODIUM CHLORIDE 0.9 % IV SOLN
1000.0000 mL | Freq: Once | INTRAVENOUS | Status: AC
  Administered 2012-06-09: 1000 mL via INTRAVENOUS

## 2012-06-09 MED ORDER — SODIUM CHLORIDE 0.9 % IV SOLN
1000.0000 mL | INTRAVENOUS | Status: DC
  Administered 2012-06-09: 1000 mL via INTRAVENOUS

## 2012-06-09 MED ORDER — DIPHENHYDRAMINE HCL 50 MG/ML IJ SOLN
25.0000 mg | Freq: Once | INTRAMUSCULAR | Status: AC
  Administered 2012-06-09: 25 mg via INTRAVENOUS
  Filled 2012-06-09: qty 1

## 2012-06-09 NOTE — ED Notes (Signed)
Up to bathroom with no assistance

## 2012-06-09 NOTE — ED Notes (Signed)
Pt c/o abd pain, n/v, vaginal bleeding, sob, bilateral rib cage pain. symptoms started last Sunday, pt states that she has been to the er twice this week for the same symptoms, is not any better.

## 2012-06-09 NOTE — ED Notes (Signed)
Pt requesting pain medication. Will notify MD.

## 2012-06-09 NOTE — ED Provider Notes (Signed)
History  This chart was scribed for Ward Givens, MD by Bennett Scrape, ED Scribe. This patient was seen in room [facility]19[facility] and the patient's care was started at 6:47 PM.  CSN: [id number]  Arrival date & time 06/09/12  1804   First MD Initiated Contact with Patient 06/09/12 1847      Chief Complaint  Patient presents with  . Abdominal Pain    The history is provided by the patient. No language interpreter was used.   HPI Comments: Tanya Krause is a 27 y.o. female who presents to the Emergency Department complaining of 6 days of diffuse abdominal pain described as sharp that radiates into the lower back with associated nausea, emesis, and bilateral rib cage pain. She states that today the pain worse more in the lower area. She also reports 2 episodes of possible hematuria, one described as passing a large blood clot and the other as seeing light pink blood with whiping. She is wearing a menstrual pad but denies seeing blood on the pad and admits that she is unsure whether the blood is coming from her vagina or urethra.   She has been to the ED twice this week (3 days ago and yesterday) for the same symptoms with a large negative work up. Pelvic exam performed earlier this week showed some bacteria and pt states that she has been taking flagyl as directed with no improvement. She was discharged yesterday with a GI follow up arranged and with a prescription for Zofran. She states that she has been taking the antiemetic with no improvement. She reports that she tried to drink Ginger Ale today but vomited it up shortly afterwards and has not tried again since. She has a h/o chronic abdominal pain but states that  this pain is sharper, more dull and more in her back than her normal chronic pain.  She is G4P4. LNMP was last week and states that it was earlier than normal but lasted for the normal amount of time. She denies any new sexual partners. She had a tubal ligation last year and states that this  is when her chronic abdominal pain started. She denies dysuria, diarrhea, constipation, vaginal discharge and frequency.  She has a h/o HTN, asthma and PID. She is currently on flexeril as needed, on protonix, inderal for HTN, and Zomig, fioricet and elavil for migraines.Pt is a current 1 to 2 cigarettes a day smoker and former alcohol user, last drink was 2 months ago. She denies drug use.  OB-GYN is Dr. Emelda Fear, has an appointment on May 30th. GI appointment with Rehman but has to be seen by PCP first.   Past Medical History  Diagnosis Date  . Hypertension   . Migraine   . Asthma   . Pelvic inflammatory disease (PID)     Past Surgical History  Procedure Laterality Date  . Tubal ligation    . Kidney infections      Family History  Problem Relation Age of Onset  . Hypertension Mother   . Diabetes Mother   . Heart failure Father     History  Substance Use Topics  . Smoking status: Current Some Day Smoker    Types: Cigarettes  . Smokeless tobacco: Never Used  . Alcohol Use: Yes     Comment: occasionally-once a month  Pt is studying office administration online unemployed  OB History   Grav Para Term Preterm Abortions TAB SAB Ect Mult Living   4 4 4  4      Review of Systems  Cardiovascular: Positive for chest pain. Negative for leg swelling.  Gastrointestinal: Positive for nausea, vomiting and abdominal pain. Negative for diarrhea and constipation.  Genitourinary: Positive for hematuria. Negative for dysuria, frequency and vaginal discharge.  All other systems reviewed and are negative.    Allergies  Aspirin; Doxycycline; Hydromorphone hcl; Ibuprofen; Metoclopramide; Other; Shellfish allergy; Toradol; and Latex  Home Medications   Current Outpatient Rx  Name  Route  Sig  Dispense  Refill  . acetaminophen (TYLENOL) 500 MG tablet   Oral   Take 500-1,000 mg by mouth daily as needed for pain.         Marland Kitchen albuterol (PROVENTIL HFA;VENTOLIN HFA) 108 (90 BASE)  MCG/ACT inhaler   Inhalation   Inhale 2 puffs into the lungs once.   1 Inhaler   1     With spacer.   Marland Kitchen amitriptyline (ELAVIL) 50 MG tablet   Oral   Take 50 mg by mouth at bedtime.         . butalbital-acetaminophen-caffeine (FIORICET, ESGIC) 50-325-40 MG per tablet   Oral   Take 1 tablet by mouth 2 (two) times daily as needed for headache.   30 tablet   0   . cyclobenzaprine (FLEXERIL) 10 MG tablet   Oral   Take 1 tablet (10 mg total) by mouth 2 (two) times daily as needed for muscle spasms.   20 tablet   0   . HYDROcodone-acetaminophen (NORCO/VICODIN) 5-325 MG per tablet   Oral   Take 1 tablet by mouth every 4 (four) hours as needed for pain.   15 tablet   0   . metroNIDAZOLE (FLAGYL) 500 MG tablet   Oral   Take 1 tablet (500 mg total) by mouth 2 (two) times daily.   14 tablet   0   . ondansetron (ZOFRAN) 4 MG tablet   Oral   Take 1 tablet (4 mg total) by mouth every 6 (six) hours.   12 tablet   0   . pantoprazole (PROTONIX) 40 MG tablet   Oral   Take 1 tablet (40 mg total) by mouth daily.   15 tablet   0   . propranolol (INDERAL) 80 MG tablet   Oral   Take 80 mg by mouth 2 (two) times daily.          Marland Kitchen zolmitriptan (ZOMIG) 5 MG tablet   Oral   Take 5 mg by mouth as needed. Migraines, may take every 2 hours if needed, not exceeding 2 in a 24 hour period           Triage Vitals: BP 154/100  Pulse 70  Temp(Src) 98.2 F (36.8 C) (Oral)  Resp 18  Ht 5' (1.524 m)  Wt 149 lb (67.586 kg)  BMI 29.1 kg/m2  SpO2 100%  LMP 05/30/2012  Vital signs normal    Physical Exam  Nursing note and vitals reviewed. Constitutional: She is oriented to person, place, and time. She appears well-developed and well-nourished.  Non-toxic appearance. She does not appear ill. No distress.  HENT:  Head: Normocephalic and atraumatic.  Right Ear: External ear normal.  Left Ear: External ear normal.  Nose: Nose normal. No mucosal edema or rhinorrhea.   Mouth/Throat: Oropharynx is clear and moist and mucous membranes are normal. No dental abscesses or edematous.  Eyes: Conjunctivae and EOM are normal. Pupils are equal, round, and reactive to light.  Neck: Normal range of motion and full  passive range of motion without pain. Neck supple.  Cardiovascular: Normal rate, regular rhythm and normal heart sounds.  Exam reveals no gallop and no friction rub.   No murmur heard. Pulmonary/Chest: Effort normal and breath sounds normal. No respiratory distress. She has no wheezes. She has no rhonchi. She has no rales. She exhibits no tenderness and no crepitus.  Abdominal: Soft. Normal appearance and bowel sounds are normal. There is tenderness (diffuse, no localization). There is no rebound and no guarding.  Appears distended   Musculoskeletal: Normal range of motion. She exhibits no edema and no tenderness.  Moves all extremities well.   Neurological: She is alert and oriented to person, place, and time. She has normal strength. No cranial nerve deficit.  Skin: Skin is warm, dry and intact. No rash noted. No erythema. No pallor.  Psychiatric: She has a normal mood and affect. Her speech is normal and behavior is normal. Her mood appears not anxious.    ED Course  Procedures (including critical care time)  Medications  0.9 %  sodium chloride infusion (0 mLs Intravenous Stopped 06/09/12 2207)    Followed by  0.9 %  sodium chloride infusion (0 mLs Intravenous Stopped 06/10/12 0056)  diphenhydrAMINE (BENADRYL) injection 25 mg (25 mg Intravenous Given 06/09/12 2105)  promethazine (PHENERGAN) injection 25 mg (25 mg Intravenous Given 06/09/12 2105)  promethazine (PHENERGAN) injection 25 mg (25 mg Intravenous Given 06/09/12 2330)  acetaminophen (TYLENOL) tablet 1,000 mg (1,000 mg Oral Given 06/09/12 2331)    DIAGNOSTIC STUDIES: Oxygen Saturation is 100% on room air, normal by my interpretation.    COORDINATION OF CARE: 8:31 PM-Reviewed pt's prior visits  since April 2013 and discussed that pt's prior lab and radiology work up was negative. Discussed treatment plan which includes an in and out cath and UA with pt at bedside and pt agreed to plan. Pt has had 8 ED visits in the past 6 months, this is her 3rd ED visit this week.   Patient states she is allergic to Reglan and Toradol although she's received both of those within the last 5 months.  Review of patient's prior chart shows she's been coming to the ED since April of last year with abdominal pain. She's had 7 AP CT scans since 2009 and she has had 3 pelvic US since December 2013. Review of the [facility] site showsPatient received 2 prescriptions in April from a dentist for hydrocodone 5/325 total of 50 tablets, she received 3 prescriptions in January for hydrocodone 5/325-75 tablets mainly from the ED. She has had her prescriptions filled from 3 different pharmacies in the past 6 months.   Nurse reports when she did the cath urine patient was having vaginal bleeding.   Results for orders placed during the hospital encounter of 06/09/12  URINALYSIS, ROUTINE W REFLEX MICROSCOPIC      Result Value Range   Color, Urine YELLOW  YELLOW   APPearance CLEAR  CLEAR   Specific Gravity, Urine 1.020  1.005 - 1.030   pH 8.0  5.0 - 8.0   Glucose, UA NEGATIVE  NEGATIVE mg/dL   Hgb urine dipstick TRACE (*) NEGATIVE   Bilirubin Urine NEGATIVE  NEGATIVE   Ketones, ur NEGATIVE  NEGATIVE mg/dL   Protein, ur NEGATIVE  NEGATIVE mg/dL   Urobilinogen, UA 0.2  0.0 - 1.0 mg/dL   Nitrite NEGATIVE  NEGATIVE   Leukocytes, UA NEGATIVE  NEGATIVE  PREGNANCY, URINE      Result Value Range   Preg Test, Ur  NEGATIVE  NEGATIVE  URINE RAPID DRUG SCREEN (HOSP PERFORMED)      Result Value Range   Opiates POSITIVE (*) NONE DETECTED   Cocaine NONE DETECTED  NONE DETECTED   Benzodiazepines NONE DETECTED  NONE DETECTED   Amphetamines NONE DETECTED  NONE DETECTED   Tetrahydrocannabinol POSITIVE (*) NONE DETECTED   Barbiturates  POSITIVE (*) NONE DETECTED  URINE MICROSCOPIC-ADD ON      Result Value Range   Squamous Epithelial / LPF FEW (*) RARE   WBC, UA 0-2  <3 WBC/hpf   RBC / HPF 0-2  <3 RBC/hpf   Laboratory interpretation all normal except +UDS    US Transvaginal Non-ob  06/06/2012     IMPRESSION: Unremarkable pelvic sonography.   Original Report Authenticated By: Ulyses Southward, M.D.   US Pelvis Complete  06/06/2012   IMPRESSION: Unremarkable pelvic sonography.   Original Report Authenticated By: Ulyses Southward, M.D.   Dg Abd Acute W/chest  06/06/2012  IMPRESSION: No acute abnormalities.   Original Report Authenticated By: Ulyses Southward, M.D.   01/02/2012 CT ABDOMEN AND PELVIS WITH CONTRAST  IMPRESSION:  Normal appendix.  Nonspecific appearance to the uterus and adnexa. Consider  ultrasound if clinically warranted.  Ill-defined hypoattenuation within the periphery of the right  hepatic lobe. This is nonspecific however more prominent in the  interval. May reflect areas of fatty infiltration. Consider a non  emergent MRI follow-up.  Original Report Authenticated By: Jearld Lesch, M.D.     1. Chronic abdominal pain   2. Menses painful     New Prescriptions   ONDANSETRON (ZOFRAN ODT) 8 MG DISINTEGRATING TABLET    Take 1 tablet (8 mg total) by mouth every 8 (eight) hours as needed for nausea.   PROMETHAZINE (PHENERGAN) 25 MG TABLET    Take 1 tablet (25 mg total) by mouth every 6 (six) hours as needed for nausea (or abdominal cramping).    Plan discharge   Devoria Albe, MD, FACEP    MDM   I personally performed the services described in this documentation, which was scribed in my presence. The recorded information has been reviewed and considered.  Devoria Albe, MD, FACEP    Ward Givens, MD 06/10/12 [phone]

## 2012-06-10 MED ORDER — ONDANSETRON 8 MG PO TBDP: 8.0000 mg | ORAL_TABLET | Freq: Three times a day (TID) | ORAL | Status: DC | PRN

## 2012-06-10 MED ORDER — PROMETHAZINE HCL 25 MG PO TABS: 25.0000 mg | ORAL_TABLET | Freq: Four times a day (QID) | ORAL | Status: DC | PRN

## 2012-06-10 NOTE — ED Notes (Signed)
nad noted prior to dc. Dc instructions reviewed with pt. 2 scripts given to pt. Ambulated out without difficulty. F/u appt was discussed and pt voiced understanding.

## 2012-06-10 NOTE — ED Notes (Signed)
Pt states she feels better and is ready to go home. Dr. Lynelle Doctor aware.

## 2012-06-14 ENCOUNTER — Encounter: Payer: Self-pay | Admitting: *Deleted

## 2012-06-15 ENCOUNTER — Ambulatory Visit (INDEPENDENT_AMBULATORY_CARE_PROVIDER_SITE_OTHER): Payer: Medicaid Other | Admitting: Obstetrics and Gynecology

## 2012-06-15 ENCOUNTER — Encounter: Payer: Self-pay | Admitting: Obstetrics and Gynecology

## 2012-06-15 VITALS — BP 120/80 | Ht 60.0 in | Wt 135.0 lb

## 2012-06-15 DIAGNOSIS — R109 Unspecified abdominal pain: Secondary | ICD-10-CM

## 2012-06-15 MED ORDER — HYDROCODONE-ACETAMINOPHEN 5-325 MG PO TABS: 1.0000 | ORAL_TABLET | ORAL | Status: DC | PRN

## 2012-06-15 NOTE — Progress Notes (Signed)
Patient ID: Tanya Krause, female   DOB: [birth year], 28 y.o.   MRN: [id number] Pt here today for follow up from ER. Pt states she has severe abdominal pain, bloating, and nausea since she had her tubal. Tubal was done in 2011. Pain has progressively gotten worse. 27 year old female status post tubal ligation and vaginal delivery 2001 with no GYN visits since that time. Abdominal pain and heavy menses since then, progressively worse particularly this summer. No fevers chills. Seen twice in the emergency room this point already Physical Examination: General appearance - alert, well appearing, and in no distress, normal appearing weight, well hydrated and ill-appearing Mental status - normal mood, behavior, speech, dress, motor activity, and thought processes Abdomen - soft, nontender, nondistended, no masses or organomegaly tenderness noted near umbilicus during bimanual exam Pelvic - normal external genitalia, vulva, vagina, cervix, uterus and adnexa,  VULVA: normal appearing vulva with no masses, tenderness or lesions,  VAGINA: normal appearing vagina with normal color and discharge, no lesions,  CERVIX: normal appearing cervix without discharge or lesions,  UTERUS: tenderness to palpation. Mid position uterus, within normal limits size or or minimally enlarged, mobile, no cervical motion tenderness or purulence,  ADNEXA: normal adnexa in size, nontender and no masses Extremities - peripheral pulses normal, no pedal edema, no clubbing or cyanosis Assessment lower abdominal pain, GYN versus gastro etiology for the pain  Plan: Will obtain pelvic ultrasound transvaginal GC chlamydia collected scheduled to 2 weeks or later for Pap smear and review of pelvic ultrasound

## 2012-06-19 ENCOUNTER — Encounter (HOSPITAL_COMMUNITY): Payer: Self-pay | Admitting: *Deleted

## 2012-06-19 ENCOUNTER — Emergency Department (HOSPITAL_COMMUNITY)
Admission: EM | Admit: 2012-06-19 | Discharge: 2012-06-20 | Disposition: A | Discharge status: Home / Self Care | Payer: Medicaid Other | Attending: Emergency Medicine | Admitting: Emergency Medicine

## 2012-06-19 DIAGNOSIS — L299 Pruritus, unspecified: Secondary | ICD-10-CM | POA: Insufficient documentation

## 2012-06-19 DIAGNOSIS — J45909 Unspecified asthma, uncomplicated: Secondary | ICD-10-CM | POA: Insufficient documentation

## 2012-06-19 DIAGNOSIS — Z8742 Personal history of other diseases of the female genital tract: Secondary | ICD-10-CM | POA: Insufficient documentation

## 2012-06-19 DIAGNOSIS — F17200 Nicotine dependence, unspecified, uncomplicated: Secondary | ICD-10-CM | POA: Insufficient documentation

## 2012-06-19 DIAGNOSIS — I10 Essential (primary) hypertension: Secondary | ICD-10-CM | POA: Insufficient documentation

## 2012-06-19 DIAGNOSIS — Y9389 Activity, other specified: Secondary | ICD-10-CM | POA: Insufficient documentation

## 2012-06-19 DIAGNOSIS — T628X1A Toxic effect of other specified noxious substances eaten as food, accidental (unintentional), initial encounter: Secondary | ICD-10-CM | POA: Insufficient documentation

## 2012-06-19 DIAGNOSIS — Z79899 Other long term (current) drug therapy: Secondary | ICD-10-CM | POA: Insufficient documentation

## 2012-06-19 DIAGNOSIS — G43909 Migraine, unspecified, not intractable, without status migrainosus: Secondary | ICD-10-CM | POA: Insufficient documentation

## 2012-06-19 DIAGNOSIS — T7840XA Allergy, unspecified, initial encounter: Secondary | ICD-10-CM

## 2012-06-19 DIAGNOSIS — Y9289 Other specified places as the place of occurrence of the external cause: Secondary | ICD-10-CM | POA: Insufficient documentation

## 2012-06-19 DIAGNOSIS — R1310 Dysphagia, unspecified: Secondary | ICD-10-CM | POA: Insufficient documentation

## 2012-06-19 DIAGNOSIS — R110 Nausea: Secondary | ICD-10-CM | POA: Insufficient documentation

## 2012-06-19 DIAGNOSIS — R0602 Shortness of breath: Secondary | ICD-10-CM | POA: Insufficient documentation

## 2012-06-19 DIAGNOSIS — Z91040 Latex allergy status: Secondary | ICD-10-CM | POA: Insufficient documentation

## 2012-06-19 MED ORDER — PROMETHAZINE HCL 25 MG/ML IJ SOLN
12.5000 mg | Freq: Once | INTRAMUSCULAR | Status: AC
  Administered 2012-06-19: 12.5 mg via INTRAVENOUS
  Filled 2012-06-19: qty 1

## 2012-06-19 MED ORDER — FAMOTIDINE 40 MG PO TABS: 40.0000 mg | ORAL_TABLET | Freq: Every day | ORAL | Status: DC

## 2012-06-19 MED ORDER — PREDNISONE 20 MG PO TABS: ORAL_TABLET | ORAL | Status: DC

## 2012-06-19 MED ORDER — METHYLPREDNISOLONE SODIUM SUCC 125 MG IJ SOLR
125.0000 mg | Freq: Once | INTRAMUSCULAR | Status: AC
  Administered 2012-06-19: 125 mg via INTRAVENOUS
  Filled 2012-06-19: qty 2

## 2012-06-19 MED ORDER — DIPHENHYDRAMINE HCL 50 MG/ML IJ SOLN
50.0000 mg | Freq: Once | INTRAMUSCULAR | Status: AC
  Administered 2012-06-19: 50 mg via INTRAVENOUS
  Filled 2012-06-19: qty 1

## 2012-06-19 MED ORDER — FAMOTIDINE IN NACL 20-0.9 MG/50ML-% IV SOLN
20.0000 mg | Freq: Once | INTRAVENOUS | Status: AC
  Administered 2012-06-19: 20 mg via INTRAVENOUS
  Filled 2012-06-19: qty 50

## 2012-06-19 NOTE — ED Notes (Addendum)
Pt reporting some SOB and tingling following eating a sandwich with tomatoes.  Reports taking one benadryl with minimal relief.  Reports eating sandwich over an hour ago.  Pt having no difficulty speaking in triage.

## 2012-06-19 NOTE — ED Notes (Signed)
EDP notified of pt's request for meds for nausea and pain

## 2012-06-19 NOTE — ED Notes (Signed)
Pt states that she ate a sub that had tomatoes and mayo on it and that she is allergic to both

## 2012-06-19 NOTE — ED Notes (Signed)
Pt requesting something to eat and drink.  Reporting improvement in nausea.

## 2012-06-19 NOTE — ED Provider Notes (Signed)
History     This chart was scribed for Ward Givens, MD, MD by Smitty Pluck, ED Scribe. The patient was seen in room APA06/APA06 and the patient's care was started at 10:21 PM.   CSN: [id number]  Arrival date & time 06/19/12  2110   Chief Complaint  Patient presents with  . Allergic Reaction     Patient is a 27 y.o. female presenting with allergic reaction. The history is provided by the patient and medical records. No language interpreter was used.  Allergic Reaction Presenting symptoms: difficulty swallowing    HPI Comments: Tanya Krause is a 28 y.o. female who presents to the Emergency Department complaining of possible allergic reaction onset today 2 hours ago. Pt reports that she ordered a subsegmental wedge and requested there not be any tomatoes on the sandwich because she is allergic. She states she took 2 bites and about 10 minutes later she noticed "stinging" sensation over entire body.  She mentions she vomited. Pt reports that she felt like she had trouble swallowing and SOB and difficulty breathing. Pt states she has itching sensation over entire body.  Pt denies rash, fever, chills, diarrhea, weakness, cough and any other pain.   This is her 9th ED visit in 6 months.   Pt states that she smokes 2 cigarettes daily.  Pt goes [employer].   Past Medical History  Diagnosis Date  . Hypertension   . Migraine   . Asthma   . Pelvic inflammatory disease (PID)     Past Surgical History  Procedure Laterality Date  . Tubal ligation    . Kidney infections    . Wisdom tooth extraction      Family History  Problem Relation Age of Onset  . Hypertension Mother   . Diabetes Mother   . Heart failure Father     History  Substance Use Topics  . Smoking status: Current Some Day Smoker    Types: Cigarettes  . Smokeless tobacco: Never Used  . Alcohol Use: Yes     Comment: occasionally-once a month   going to school online  OB History   Grav Para Term  Preterm Abortions TAB SAB Ect Mult Living   4 4 1 3      4       Review of Systems  Constitutional: Negative for fever and chills.  HENT: Positive for trouble swallowing.   Respiratory: Positive for shortness of breath.   Gastrointestinal: Positive for nausea and vomiting.  All other systems reviewed and are negative.    Allergies  Tomato; Aspirin; Doxycycline; Hydromorphone hcl; Ibuprofen; Metoclopramide; Other; Shellfish allergy; Toradol; and Latex  Home Medications   Current Outpatient Rx  Name  Route  Sig  Dispense  Refill  . acetaminophen (TYLENOL) 500 MG tablet   Oral   Take 500-1,000 mg by mouth daily as needed for pain.         Marland Kitchen albuterol (PROVENTIL HFA;VENTOLIN HFA) 108 (90 BASE) MCG/ACT inhaler   Inhalation   Inhale 2 puffs into the lungs once.   1 Inhaler   1     With spacer.   Marland Kitchen amitriptyline (ELAVIL) 50 MG tablet   Oral   Take 50 mg by mouth at bedtime.         . butalbital-acetaminophen-caffeine (FIORICET, ESGIC) 50-325-40 MG per tablet   Oral   Take 1 tablet by mouth 2 (two) times daily as needed for headache.   30 tablet   0   .  cyclobenzaprine (FLEXERIL) 10 MG tablet   Oral   Take 1 tablet (10 mg total) by mouth 2 (two) times daily as needed for muscle spasms.   20 tablet   0   . HYDROcodone-acetaminophen (NORCO/VICODIN) 5-325 MG per tablet   Oral   Take 1 tablet by mouth every 4 (four) hours as needed for pain.   40 tablet   1   . metroNIDAZOLE (FLAGYL) 500 MG tablet   Oral   Take 1 tablet (500 mg total) by mouth 2 (two) times daily.   14 tablet   0   . ondansetron (ZOFRAN ODT) 8 MG disintegrating tablet   Oral   Take 1 tablet (8 mg total) by mouth every 8 (eight) hours as needed for nausea.   6 tablet   0   . ondansetron (ZOFRAN) 4 MG tablet   Oral   Take 1 tablet (4 mg total) by mouth every 6 (six) hours.   12 tablet   0   . pantoprazole (PROTONIX) 40 MG tablet   Oral   Take 1 tablet (40 mg total) by mouth daily.   15  tablet   0   . promethazine (PHENERGAN) 25 MG tablet   Oral   Take 1 tablet (25 mg total) by mouth every 6 (six) hours as needed for nausea (or abdominal cramping).   10 tablet   0   . propranolol (INDERAL) 80 MG tablet   Oral   Take 80 mg by mouth 2 (two) times daily.          Marland Kitchen zolmitriptan (ZOMIG) 5 MG tablet   Oral   Take 5 mg by mouth as needed. Migraines, may take every 2 hours if needed, not exceeding 2 in a 24 hour period           BP 129/94  Pulse 108  Temp(Src) 97.3 F (36.3 C) (Oral)  Resp 20  Ht 5' (1.524 m)  Wt 145 lb (65.772 kg)  BMI 28.32 kg/m2  SpO2 100%  LMP 06/09/2012  Vital signs normal except tachycardia   Physical Exam  Nursing note and vitals reviewed. Constitutional: She is oriented to person, place, and time. She appears well-developed and well-nourished.  Non-toxic appearance. She does not appear ill. No distress.  HENT:  Head: Normocephalic and atraumatic.  Right Ear: External ear normal.  Left Ear: External ear normal.  Nose: Nose normal. No mucosal edema or rhinorrhea.  Mouth/Throat: Oropharynx is clear and moist and mucous membranes are normal. No dental abscesses or edematous.  Eyes: Conjunctivae and EOM are normal. Pupils are equal, round, and reactive to light.  Neck: Normal range of motion and full passive range of motion without pain. Neck supple.  Cardiovascular: Normal rate, regular rhythm and normal heart sounds.  Exam reveals no gallop and no friction rub.   No murmur heard. Pulmonary/Chest: Effort normal and breath sounds normal. No respiratory distress. She has no wheezes. She has no rhonchi. She has no rales. She exhibits no tenderness and no crepitus.  Abdominal: Soft. Normal appearance and bowel sounds are normal. She exhibits no distension. There is no tenderness. There is no rebound and no guarding.  Musculoskeletal: Normal range of motion. She exhibits no edema and no tenderness.  Moves all extremities well.    Neurological: She is alert and oriented to person, place, and time. She has normal strength. No cranial nerve deficit.  Skin: Skin is warm, dry and intact. No rash noted. No erythema. No pallor.  Psychiatric: She has a normal mood and affect. Her speech is normal and behavior is normal. Her mood appears not anxious.    ED Course  Procedures (including critical care time)  Medications  diphenhydrAMINE (BENADRYL) injection 50 mg (50 mg Intravenous Given 06/19/12 2205)  famotidine (PEPCID) IVPB 20 mg (0 mg Intravenous Stopped 06/19/12 2311)  methylPREDNISolone sodium succinate (SOLU-MEDROL) 125 mg/2 mL injection 125 mg (125 mg Intravenous Given 06/19/12 2205)  promethazine (PHENERGAN) injection 12.5 mg (12.5 mg Intravenous Given 06/19/12 2322)    DIAGNOSTIC STUDIES: Oxygen Saturation is 100% on room air, normal by my interpretation.    COORDINATION OF CARE: 10:25 PM Discussed ED treatment with pt and pt agrees.   Pt also c/o her usual headache, given meds.   Patient is feeling better at time of discharge     1. Allergic reaction, initial encounter     New Prescriptions   FAMOTIDINE (PEPCID) 40 MG TABLET    Take 1 tablet (40 mg total) by mouth daily.   PREDNISONE (DELTASONE) 20 MG TABLET    Take 3 po QD x 2d starting tomorrow, then 2 po QD x 3d then 1 po QD x 3d    Plan discharge  Devoria Albe, MD, FACEP   MDM    I personally performed the services described in this documentation, which was scribed in my presence. The recorded information has been reviewed and considered.  Devoria Albe, MD, FACEP    Ward Givens, MD 06/20/12 [phone]

## 2012-06-25 ENCOUNTER — Other Ambulatory Visit: Payer: Medicaid Other

## 2012-07-06 ENCOUNTER — Other Ambulatory Visit: Payer: Medicaid Other | Admitting: Obstetrics and Gynecology

## 2012-07-17 ENCOUNTER — Telehealth: Payer: Self-pay | Admitting: Diagnostic Neuroimaging

## 2012-07-18 NOTE — Telephone Encounter (Signed)
Spoke to patient. Says she has been having a migraine for the past 2 weeks. Neck muscles are tight also. Requesting to come in for Depacon infusion on 07/19/12. Advised would ask Dr. Marjory Lies.

## 2012-07-22 ENCOUNTER — Emergency Department (HOSPITAL_COMMUNITY)
Admission: EM | Admit: 2012-07-22 | Discharge: 2012-07-22 | Disposition: A | Discharge status: Home / Self Care | Payer: Medicaid Other | Attending: Emergency Medicine | Admitting: Emergency Medicine

## 2012-07-22 ENCOUNTER — Encounter (HOSPITAL_COMMUNITY): Payer: Self-pay | Admitting: *Deleted

## 2012-07-22 DIAGNOSIS — Z91040 Latex allergy status: Secondary | ICD-10-CM | POA: Insufficient documentation

## 2012-07-22 DIAGNOSIS — I10 Essential (primary) hypertension: Secondary | ICD-10-CM | POA: Insufficient documentation

## 2012-07-22 DIAGNOSIS — Z79899 Other long term (current) drug therapy: Secondary | ICD-10-CM | POA: Insufficient documentation

## 2012-07-22 DIAGNOSIS — IMO0002 Reserved for concepts with insufficient information to code with codable children: Secondary | ICD-10-CM | POA: Insufficient documentation

## 2012-07-22 DIAGNOSIS — Z3202 Encounter for pregnancy test, result negative: Secondary | ICD-10-CM | POA: Insufficient documentation

## 2012-07-22 DIAGNOSIS — M549 Dorsalgia, unspecified: Secondary | ICD-10-CM | POA: Insufficient documentation

## 2012-07-22 DIAGNOSIS — F17200 Nicotine dependence, unspecified, uncomplicated: Secondary | ICD-10-CM | POA: Insufficient documentation

## 2012-07-22 DIAGNOSIS — G43909 Migraine, unspecified, not intractable, without status migrainosus: Secondary | ICD-10-CM | POA: Insufficient documentation

## 2012-07-22 DIAGNOSIS — J45909 Unspecified asthma, uncomplicated: Secondary | ICD-10-CM | POA: Insufficient documentation

## 2012-07-22 DIAGNOSIS — G8929 Other chronic pain: Secondary | ICD-10-CM | POA: Insufficient documentation

## 2012-07-22 DIAGNOSIS — Z8742 Personal history of other diseases of the female genital tract: Secondary | ICD-10-CM | POA: Insufficient documentation

## 2012-07-22 DIAGNOSIS — R112 Nausea with vomiting, unspecified: Secondary | ICD-10-CM | POA: Insufficient documentation

## 2012-07-22 LAB — URINALYSIS, ROUTINE W REFLEX MICROSCOPIC
Bilirubin Urine: NEGATIVE
Glucose, UA: NEGATIVE mg/dL
Hgb urine dipstick: NEGATIVE
Specific Gravity, Urine: 1.02 (ref 1.005–1.030)
pH: 7.5 (ref 5.0–8.0)

## 2012-07-22 MED ORDER — SODIUM CHLORIDE 0.9 % IV BOLUS (SEPSIS)
1000.0000 mL | Freq: Once | INTRAVENOUS | Status: AC
  Administered 2012-07-22: 1000 mL via INTRAVENOUS

## 2012-07-22 MED ORDER — MORPHINE SULFATE 4 MG/ML IJ SOLN
6.0000 mg | Freq: Once | INTRAMUSCULAR | Status: AC
  Administered 2012-07-22: 6 mg via INTRAVENOUS
  Filled 2012-07-22: qty 2

## 2012-07-22 MED ORDER — ONDANSETRON HCL 4 MG/2ML IJ SOLN
4.0000 mg | Freq: Once | INTRAMUSCULAR | Status: AC
  Administered 2012-07-22: 4 mg via INTRAVENOUS
  Filled 2012-07-22: qty 2

## 2012-07-22 MED ORDER — PROMETHAZINE HCL 25 MG PO TABS: 25.0000 mg | ORAL_TABLET | Freq: Four times a day (QID) | ORAL | Status: DC | PRN

## 2012-07-22 MED ORDER — MORPHINE SULFATE 4 MG/ML IJ SOLN
4.0000 mg | Freq: Once | INTRAMUSCULAR | Status: AC
  Administered 2012-07-22: 4 mg via INTRAVENOUS
  Filled 2012-07-22: qty 1

## 2012-07-22 MED ORDER — OXYCODONE-ACETAMINOPHEN 5-325 MG PO TABS: 2.0000 | ORAL_TABLET | ORAL | Status: DC | PRN

## 2012-07-22 MED ORDER — METHYLPREDNISOLONE SODIUM SUCC 125 MG IJ SOLR
125.0000 mg | Freq: Once | INTRAMUSCULAR | Status: AC
  Administered 2012-07-22: 125 mg via INTRAVENOUS
  Filled 2012-07-22: qty 2

## 2012-07-22 MED ORDER — DIPHENHYDRAMINE HCL 50 MG/ML IJ SOLN
25.0000 mg | Freq: Once | INTRAMUSCULAR | Status: AC
  Administered 2012-07-22: 25 mg via INTRAVENOUS
  Filled 2012-07-22: qty 1

## 2012-07-22 NOTE — ED Notes (Signed)
Pt requesting additional nausea and pain medication. EDP aware.

## 2012-07-22 NOTE — ED Notes (Signed)
Pt states mid-lower back pain x 3 days, radiating to bilateral flank areas. States migraine started Thursday, unable to get in to see neurologist. Vomiting began last last night. Last vomited this morning at 0500. Pt states dizziness with standing.

## 2012-07-22 NOTE — ED Provider Notes (Signed)
History  This chart was scribed for Tanya Hutching, MD by Bennett Scrape, ED Scribe. This patient was seen in room APA09/APA09 and the patient's care was started at 9:24 AM.  CSN: [id number]  Arrival date & time 07/22/12  0906   First MD Initiated Contact with Patient 07/22/12 [phone]     Chief Complaint  Patient presents with  . Back Pain  . Migraine  . Emesis    The history is provided by the patient. No language interpreter was used.    HPI Comments: Tanya Krause is a 27 y.o. female who presents to the Emergency Department complaining of 3 days of constant, diffuse HAs described as her typical migraines that radiate into the posterior neck with associated nausea and emesis. Last episode of emesis occurred around 5 AM this morning. She has been taking Fioricet, Vicodin and Zomig with no improvement. She admits that she has been unable to get an appointment with her neurologist. She also c/o 3 days of bilateral back pain that starts in the thoracic area and radiates down to the lumbar region. The pain is felt more on the right in the sacral region. She denies having a h/o chronic back pain and has been taking Vicodin for the pain with no improvement. She denies urinary symptoms, weakness and numbness as associated symptoms.    Dr. Marjory Lies with [facility]  Past Medical History  Diagnosis Date  . Hypertension   . Migraine   . Asthma   . Pelvic inflammatory disease (PID)    Past Surgical History  Procedure Laterality Date  . Tubal ligation    . Kidney infections    . Wisdom tooth extraction     Family History  Problem Relation Age of Onset  . Hypertension Mother   . Diabetes Mother   . Heart failure Father    History  Substance Use Topics  . Smoking status: Current Some Day Smoker    Types: Cigarettes  . Smokeless tobacco: Never Used  . Alcohol Use: Yes     Comment: occasionally-once a month   OB History   Grav Para Term Preterm Abortions TAB SAB Ect Mult  Living   4 4 1 3      4      Review of Systems  A complete 10 system review of systems was obtained and all systems are negative except as noted in the HPI and PMH.   Allergies  Tomato; Aspirin; Doxycycline; Hydromorphone hcl; Ibuprofen; Metoclopramide; Other; Shellfish allergy; Toradol; and Latex  Home Medications   Current Outpatient Rx  Name  Route  Sig  Dispense  Refill  . albuterol (PROVENTIL HFA;VENTOLIN HFA) 108 (90 BASE) MCG/ACT inhaler   Inhalation   Inhale 2 puffs into the lungs every 6 (six) hours as needed for wheezing or shortness of breath.         Marland Kitchen amitriptyline (ELAVIL) 50 MG tablet   Oral   Take 50 mg by mouth at bedtime.         . butalbital-acetaminophen-caffeine (FIORICET, ESGIC) 50-325-40 MG per tablet   Oral   Take 1 tablet by mouth 2 (two) times daily as needed for headache.   30 tablet   0   . famotidine (PEPCID) 40 MG tablet   Oral   Take 1 tablet (40 mg total) by mouth daily.   20 tablet   0   . HYDROcodone-acetaminophen (NORCO/VICODIN) 5-325 MG per tablet   Oral   Take 1 tablet by mouth every 4 (  four) hours as needed for pain.   40 tablet   1   . pantoprazole (PROTONIX) 40 MG tablet   Oral   Take 40 mg by mouth at bedtime.         . predniSONE (DELTASONE) 20 MG tablet      Take 3 po QD x 2d starting tomorrow, then 2 po QD x 3d then 1 po QD x 3d   15 tablet   0   . promethazine (PHENERGAN) 25 MG tablet   Oral   Take 1 tablet (25 mg total) by mouth every 6 (six) hours as needed for nausea (or abdominal cramping).   10 tablet   0   . propranolol (INDERAL) 80 MG tablet   Oral   Take 80 mg by mouth 2 (two) times daily.          Marland Kitchen zolmitriptan (ZOMIG) 5 MG tablet   Oral   Take 5 mg by mouth as needed. Migraines, may take every 2 hours if needed, not exceeding 2 in a 24 hour period          Triage Vitals: BP 107/75  Pulse 89  Temp(Src) 98 F (36.7 C) (Oral)  Resp 18  SpO2 99%  LMP 07/19/2012  Physical Exam   Nursing note and vitals reviewed. Constitutional: She is oriented to person, place, and time. She appears well-developed and well-nourished.  HENT:  Head: Normocephalic and atraumatic.  Eyes: Conjunctivae and EOM are normal. Pupils are equal, round, and reactive to light.  Neck: Normal range of motion. Neck supple.  Cardiovascular: Normal rate, regular rhythm and normal heart sounds.   Pulmonary/Chest: Effort normal and breath sounds normal.  Abdominal: Soft. Bowel sounds are normal.  Musculoskeletal: Normal range of motion.  Neurological: She is alert and oriented to person, place, and time.  Skin: Skin is warm and dry.  Psychiatric: She has a normal mood and affect.    ED Course  Procedures (including critical care time)  Medications  sodium chloride 0.9 % bolus 1,000 mL (not administered)  ondansetron (ZOFRAN) injection 4 mg (not administered)  diphenhydrAMINE (BENADRYL) injection 25 mg (not administered)  morphine 4 MG/ML injection 6 mg (not administered)    DIAGNOSTIC STUDIES: Oxygen Saturation is 99% on room air, normal by my interpretation.    COORDINATION OF CARE: 9:38 AM-Discussed treatment plan which includes medications and UA with pt at bedside and pt agreed to plan.   Labs Reviewed  URINALYSIS, ROUTINE W REFLEX MICROSCOPIC  PREGNANCY, URINE   No results found.  No diagnosis found.  MDM  No meningeal signs or neuro deficits. Patient feels better after IV hydration and pain management. Discharge meds Percocet and Phenergan 25  I personally performed the services described in this documentation, which was scribed in my presence. The recorded information has been reviewed and is accurate.    Tanya Hutching, MD 07/22/12 1316

## 2012-07-26 ENCOUNTER — Other Ambulatory Visit: Payer: Medicaid Other | Admitting: Obstetrics and Gynecology

## 2012-08-02 ENCOUNTER — Encounter (HOSPITAL_COMMUNITY): Payer: Self-pay | Admitting: *Deleted

## 2012-08-02 ENCOUNTER — Emergency Department (HOSPITAL_COMMUNITY)
Admission: EM | Admit: 2012-08-02 | Discharge: 2012-08-02 | Disposition: A | Discharge status: Home / Self Care | Payer: Medicaid Other | Attending: Emergency Medicine | Admitting: Emergency Medicine

## 2012-08-02 DIAGNOSIS — R110 Nausea: Secondary | ICD-10-CM | POA: Insufficient documentation

## 2012-08-02 DIAGNOSIS — H109 Unspecified conjunctivitis: Secondary | ICD-10-CM | POA: Insufficient documentation

## 2012-08-02 DIAGNOSIS — F17200 Nicotine dependence, unspecified, uncomplicated: Secondary | ICD-10-CM | POA: Insufficient documentation

## 2012-08-02 DIAGNOSIS — I10 Essential (primary) hypertension: Secondary | ICD-10-CM | POA: Insufficient documentation

## 2012-08-02 DIAGNOSIS — G43909 Migraine, unspecified, not intractable, without status migrainosus: Secondary | ICD-10-CM | POA: Insufficient documentation

## 2012-08-02 DIAGNOSIS — H53149 Visual discomfort, unspecified: Secondary | ICD-10-CM | POA: Insufficient documentation

## 2012-08-02 DIAGNOSIS — H5789 Other specified disorders of eye and adnexa: Secondary | ICD-10-CM | POA: Insufficient documentation

## 2012-08-02 DIAGNOSIS — Z8742 Personal history of other diseases of the female genital tract: Secondary | ICD-10-CM | POA: Insufficient documentation

## 2012-08-02 DIAGNOSIS — Z79899 Other long term (current) drug therapy: Secondary | ICD-10-CM | POA: Insufficient documentation

## 2012-08-02 DIAGNOSIS — Z91040 Latex allergy status: Secondary | ICD-10-CM | POA: Insufficient documentation

## 2012-08-02 DIAGNOSIS — J45909 Unspecified asthma, uncomplicated: Secondary | ICD-10-CM | POA: Insufficient documentation

## 2012-08-02 DIAGNOSIS — H5710 Ocular pain, unspecified eye: Secondary | ICD-10-CM | POA: Insufficient documentation

## 2012-08-02 MED ORDER — BUTALBITAL-APAP-CAFFEINE 50-325-40 MG PO TABS: 1.0000 | ORAL_TABLET | Freq: Four times a day (QID) | ORAL | Status: AC | PRN

## 2012-08-02 MED ORDER — HYDROMORPHONE HCL PF 1 MG/ML IJ SOLN
1.0000 mg | Freq: Once | INTRAMUSCULAR | Status: AC
  Administered 2012-08-02: 1 mg via INTRAVENOUS
  Filled 2012-08-02: qty 1

## 2012-08-02 MED ORDER — TETRACAINE HCL 0.5 % OP SOLN
OPHTHALMIC | Status: AC
  Administered 2012-08-02: 1 [drp]
  Filled 2012-08-02: qty 2

## 2012-08-02 MED ORDER — TOBRAMYCIN 0.3 % OP SOLN
2.0000 [drp] | OPHTHALMIC | Status: DC
  Administered 2012-08-02: 2 [drp] via OPHTHALMIC

## 2012-08-02 MED ORDER — TOBRAMYCIN 0.3 % OP SOLN
OPHTHALMIC | Status: AC
  Filled 2012-08-02: qty 5

## 2012-08-02 MED ORDER — FLUORESCEIN SODIUM 1 MG OP STRP
ORAL_STRIP | OPHTHALMIC | Status: AC
  Administered 2012-08-02: 13:00:00
  Filled 2012-08-02: qty 1

## 2012-08-02 MED ORDER — HYDROCODONE-ACETAMINOPHEN 5-325 MG PO TABS
2.0000 | ORAL_TABLET | Freq: Once | ORAL | Status: AC
  Administered 2012-08-02: 2 via ORAL
  Filled 2012-08-02: qty 2

## 2012-08-02 MED ORDER — DIPHENHYDRAMINE HCL 50 MG/ML IJ SOLN
25.0000 mg | Freq: Once | INTRAMUSCULAR | Status: AC
  Administered 2012-08-02: 25 mg via INTRAVENOUS
  Filled 2012-08-02: qty 1

## 2012-08-02 MED ORDER — PROMETHAZINE HCL 25 MG PO TABS: 25.0000 mg | ORAL_TABLET | Freq: Four times a day (QID) | ORAL | Status: DC | PRN

## 2012-08-02 MED ORDER — PROMETHAZINE HCL 12.5 MG PO TABS
25.0000 mg | ORAL_TABLET | Freq: Four times a day (QID) | ORAL | Status: DC | PRN
  Administered 2012-08-02: 25 mg via ORAL
  Filled 2012-08-02: qty 2

## 2012-08-02 NOTE — ED Notes (Addendum)
Requesting additional pain medication for her migraine headaches, states that she wants the infusion that she gets at her neurologist's office.  (According to records, the infusion is Depacon).

## 2012-08-02 NOTE — ED Provider Notes (Signed)
History    CSN: [id number] Arrival date & time 08/02/12  1039  First MD Initiated Contact with Patient 08/02/12 1054     Chief Complaint  Patient presents with  . Migraine  . Eye Pain   (Consider location/radiation/quality/duration/timing/severity/associated sxs/prior Treatment) Patient is a 27 y.o. female presenting with migraines and eye pain. The history is provided by the patient.  Migraine This is a recurrent problem. The current episode started in the past 7 days. The problem occurs daily. The problem has been gradually worsening. Associated symptoms include headaches and nausea. Pertinent negatives include no abdominal pain, arthralgias, chest pain, coughing or neck pain. Associated symptoms comments: Eye pain. Exacerbated by: bright lights. Treatments tried: fioricet. The treatment provided no relief.  Eye Pain Associated symptoms include headaches and nausea. Pertinent negatives include no abdominal pain, arthralgias, chest pain, coughing or neck pain.   Past Medical History  Diagnosis Date  . Hypertension   . Migraine   . Asthma   . Pelvic inflammatory disease (PID)    Past Surgical History  Procedure Laterality Date  . Tubal ligation    . Kidney infections    . Wisdom tooth extraction     Family History  Problem Relation Age of Onset  . Hypertension Mother   . Diabetes Mother   . Heart failure Father    History  Substance Use Topics  . Smoking status: Current Some Day Smoker    Types: Cigarettes  . Smokeless tobacco: Never Used  . Alcohol Use: Yes     Comment: occasionally-once a month   OB History   Grav Para Term Preterm Abortions TAB SAB Ect Mult Living   4 4 1 3      4      Review of Systems  Constitutional: Negative for activity change.       All ROS Neg except as noted in HPI  HENT: Negative for nosebleeds and neck pain.   Eyes: Positive for photophobia and redness. Negative for discharge.  Respiratory: Negative for cough, shortness of breath  and wheezing.   Cardiovascular: Negative for chest pain and palpitations.  Gastrointestinal: Positive for nausea. Negative for abdominal pain and blood in stool.  Genitourinary: Negative for dysuria, frequency and hematuria.  Musculoskeletal: Negative for back pain and arthralgias.  Skin: Negative.   Neurological: Positive for headaches. Negative for dizziness, seizures and speech difficulty.  Psychiatric/Behavioral: Negative for hallucinations and confusion.    Allergies  Tomato; Aspirin; Doxycycline; Hydromorphone hcl; Ibuprofen; Metoclopramide; Other; Shellfish allergy; Toradol; and Latex  Home Medications   Current Outpatient Rx  Name  Route  Sig  Dispense  Refill  . acetaminophen (TYLENOL) 325 MG tablet   Oral   Take 650 mg by mouth every 6 (six) hours as needed for pain.         Marland Kitchen amitriptyline (ELAVIL) 50 MG tablet   Oral   Take 50 mg by mouth at bedtime.         . butalbital-acetaminophen-caffeine (FIORICET, ESGIC) 50-325-40 MG per tablet   Oral   Take 1 tablet by mouth 2 (two) times daily as needed for headache.   30 tablet   0   . oxyCODONE-acetaminophen (PERCOCET) 5-325 MG per tablet   Oral   Take 2 tablets by mouth every 4 (four) hours as needed for pain.   10 tablet   0   . promethazine (PHENERGAN) 25 MG tablet   Oral   Take 1 tablet (25 mg total) by mouth every 6 (  six) hours as needed for nausea.   10 tablet   0   . propranolol (INDERAL) 80 MG tablet   Oral   Take 80 mg by mouth 2 (two) times daily.          Marland Kitchen zolmitriptan (ZOMIG) 5 MG tablet   Oral   Take 5 mg by mouth as needed. Migraines, may take every 2 hours if needed, not exceeding 2 in a 24 hour period         . albuterol (PROVENTIL HFA;VENTOLIN HFA) 108 (90 BASE) MCG/ACT inhaler   Inhalation   Inhale 2 puffs into the lungs every 6 (six) hours as needed for wheezing or shortness of breath.          BP 125/90  Pulse 99  Temp(Src) 98.4 F (36.9 C) (Oral)  Resp 18  Ht 5' (1.524  m)  Wt 143 lb (64.864 kg)  BMI 27.93 kg/m2  SpO2 99%  LMP 07/19/2012 Physical Exam  Nursing note and vitals reviewed. Constitutional: She is oriented to person, place, and time. She appears well-developed and well-nourished.  Non-toxic appearance.  HENT:  Head: Normocephalic.  Right Ear: Tympanic membrane and external ear normal.  Left Ear: Tympanic membrane and external ear normal.  Eyes: EOM and lids are normal. Pupils are equal, round, and reactive to light. Left conjunctiva is injected.  Fundoscopic exam:      The right eye shows no AV nicking, no exudate, no hemorrhage and no papilledema.       The left eye shows no AV nicking, no exudate, no hemorrhage and no papilledema.  Slit lamp exam:      The left eye shows no fluorescein uptake.  Pt keeps the right eye closed and states it is difficult  Keep the eye open and it hurts. Anterior chamber clear. Globe is firm.  Neck: Normal range of motion. Neck supple. Carotid bruit is not present.  Cardiovascular: Normal rate, regular rhythm, normal heart sounds, intact distal pulses and normal pulses.   Pulmonary/Chest: Breath sounds normal. No respiratory distress.  Abdominal: Soft. Bowel sounds are normal. There is no tenderness. There is no guarding.  Musculoskeletal: Normal range of motion.  Lymphadenopathy:       Head (right side): No submandibular adenopathy present.       Head (left side): No submandibular adenopathy present.    She has no cervical adenopathy.  Neurological: She is alert and oriented to person, place, and time. She has normal strength. No cranial nerve deficit or sensory deficit.  Skin: Skin is warm and dry.  Psychiatric: She has a normal mood and affect. Her speech is normal.    ED Course  Procedures (including critical care time) Labs Reviewed - No data to display No results found. No diagnosis found. Pulse ox 99% on room air. WNL by my interpretation. MDM  *I have reviewed nursing notes, vital signs, and  all appropriate lab and imaging results for this patient.** Pt presents to the ED with c/o head pain, right eye redness and soreness. The right head pain is similar to previous migraine headaches, the eye changes are new. Some nausea present. No gross neuro deficit noted on exam. No evidence for scratch of the cornea. Suspect conjunctivitis from exam findings. Plan - Rx for fioricet #3 and promethazine given. Pt to use cool compresses to the left eye. Tobramycin eye drops given.  She will see her eye specialist if not improving.  Kathie Dike, PA-C 08/05/12 [phone]

## 2012-08-02 NOTE — ED Notes (Signed)
The patient states that Fioricet does not work for her.  Advised patient per PA, she could be discharged home with Fioricet and phenergan rx to try and if sx worsen to return to the ED for reevaluation.

## 2012-08-02 NOTE — ED Notes (Signed)
Migraine x 3 days w/R eye swelling and redness beginning Tuesday.  Has been using Benadryl, Clear eyes w/out relief.  Used Fioricet for HA w/out relief.

## 2012-08-05 NOTE — ED Provider Notes (Signed)
Medical screening examination/treatment/procedure(s) were performed by non-physician practitioner and as supervising physician I was immediately available for consultation/collaboration.  Edson Deridder B. Bernette Mayers, MD 08/05/12 [phone]

## 2012-08-23 ENCOUNTER — Emergency Department (HOSPITAL_COMMUNITY)
Admission: EM | Admit: 2012-08-23 | Discharge: 2012-08-23 | Disposition: A | Discharge status: Home / Self Care | Payer: Medicaid Other | Attending: Emergency Medicine | Admitting: Emergency Medicine

## 2012-08-23 ENCOUNTER — Encounter (HOSPITAL_COMMUNITY): Payer: Self-pay | Admitting: *Deleted

## 2012-08-23 DIAGNOSIS — Z91040 Latex allergy status: Secondary | ICD-10-CM | POA: Insufficient documentation

## 2012-08-23 DIAGNOSIS — Z79899 Other long term (current) drug therapy: Secondary | ICD-10-CM | POA: Insufficient documentation

## 2012-08-23 DIAGNOSIS — Z3202 Encounter for pregnancy test, result negative: Secondary | ICD-10-CM | POA: Insufficient documentation

## 2012-08-23 DIAGNOSIS — Z8742 Personal history of other diseases of the female genital tract: Secondary | ICD-10-CM | POA: Insufficient documentation

## 2012-08-23 DIAGNOSIS — G43909 Migraine, unspecified, not intractable, without status migrainosus: Secondary | ICD-10-CM | POA: Insufficient documentation

## 2012-08-23 DIAGNOSIS — I10 Essential (primary) hypertension: Secondary | ICD-10-CM | POA: Insufficient documentation

## 2012-08-23 DIAGNOSIS — M545 Low back pain, unspecified: Secondary | ICD-10-CM | POA: Insufficient documentation

## 2012-08-23 DIAGNOSIS — F17200 Nicotine dependence, unspecified, uncomplicated: Secondary | ICD-10-CM | POA: Insufficient documentation

## 2012-08-23 DIAGNOSIS — J45909 Unspecified asthma, uncomplicated: Secondary | ICD-10-CM | POA: Insufficient documentation

## 2012-08-23 LAB — URINALYSIS, ROUTINE W REFLEX MICROSCOPIC
Glucose, UA: NEGATIVE mg/dL
Leukocytes, UA: NEGATIVE
Nitrite: NEGATIVE
pH: 7.5 (ref 5.0–8.0)

## 2012-08-23 LAB — PREGNANCY, URINE: Preg Test, Ur: NEGATIVE

## 2012-08-23 MED ORDER — DIPHENHYDRAMINE HCL 50 MG/ML IJ SOLN
25.0000 mg | Freq: Once | INTRAMUSCULAR | Status: AC
  Administered 2012-08-23: 25 mg via INTRAVENOUS
  Filled 2012-08-23: qty 1

## 2012-08-23 MED ORDER — HYDROMORPHONE HCL PF 1 MG/ML IJ SOLN
1.0000 mg | Freq: Once | INTRAMUSCULAR | Status: AC
  Administered 2012-08-23: 1 mg via INTRAVENOUS
  Filled 2012-08-23: qty 1

## 2012-08-23 MED ORDER — SODIUM CHLORIDE 0.9 % IV BOLUS (SEPSIS)
1000.0000 mL | Freq: Once | INTRAVENOUS | Status: AC
  Administered 2012-08-23: 1000 mL via INTRAVENOUS

## 2012-08-23 MED ORDER — PROMETHAZINE HCL 25 MG/ML IJ SOLN
12.5000 mg | Freq: Once | INTRAMUSCULAR | Status: AC
  Administered 2012-08-23: 12.5 mg via INTRAVENOUS
  Filled 2012-08-23: qty 1

## 2012-08-23 NOTE — Progress Notes (Signed)
ED/CM noted pt did not have a PCP. Pt was given the ED packet of information for clinics in [place].

## 2012-08-23 NOTE — ED Notes (Addendum)
Pt with left sided HA with N/V, also c/o lower back pain, states that she took migraine med this morning at 0600 without relief, pt ambulated to room from waiting room with steady gait, pt denies any numbness or tingling

## 2012-08-23 NOTE — ED Notes (Signed)
MD at bedside. 

## 2012-08-23 NOTE — ED Notes (Signed)
Wheelchair offered for pt but pt refused, pt also refused to sign for d/c instructions

## 2012-08-23 NOTE — ED Provider Notes (Addendum)
CSN: [id number]     Arrival date & time 08/23/12  1256 History     First MD Initiated Contact with Patient 08/23/12 1427     Chief Complaint  Patient presents with  . Migraine  . Back Pain   (Consider location/radiation/quality/duration/timing/severity/associated sxs/prior Treatment) HPI Comments: Patient with a history of migraine headaches, chronic abdominal pain. She presents to the emergency department this afternoon with complaints of severe headache. She denies any injury or trauma. She reports photophobia however there is no stiff neck or fever. She states this is similar to her previous migraines, however she has had no real relief with her usual home medications. She tells me she is tempted to schedule appointments with her neurologist however they can get her in until next month.  Patient is a 26 y.o. female presenting with migraines and back pain. The history is provided by the patient.  Migraine This is a recurrent problem. The current episode started yesterday. The problem occurs constantly. The problem has not changed since onset.The symptoms are aggravated by bending. Nothing relieves the symptoms. She has tried nothing for the symptoms. The treatment provided no relief.  Back Pain   Past Medical History  Diagnosis Date  . Hypertension   . Migraine   . Asthma   . Pelvic inflammatory disease (PID)    Past Surgical History  Procedure Laterality Date  . Tubal ligation    . Kidney infections    . Wisdom tooth extraction     Family History  Problem Relation Age of Onset  . Hypertension Mother   . Diabetes Mother   . Heart failure Father    History  Substance Use Topics  . Smoking status: Current Some Day Smoker    Types: Cigarettes  . Smokeless tobacco: Never Used  . Alcohol Use: Yes     Comment: occasionally-once a month   OB History   Grav Para Term Preterm Abortions TAB SAB Ect Mult Living   4 4 1 3      4      Review of Systems  Musculoskeletal:  Positive for back pain.  All other systems reviewed and are negative.    Allergies  Tomato; Aspirin; Doxycycline; Hydromorphone hcl; Ibuprofen; Metoclopramide; Other; Shellfish allergy; Toradol; and Latex  Home Medications   Current Outpatient Rx  Name  Route  Sig  Dispense  Refill  . acetaminophen (TYLENOL) 500 MG tablet   Oral   Take 1,000 mg by mouth every 6 (six) hours as needed for pain.         Marland Kitchen albuterol (PROVENTIL HFA;VENTOLIN HFA) 108 (90 BASE) MCG/ACT inhaler   Inhalation   Inhale 2 puffs into the lungs every 6 (six) hours as needed for wheezing or shortness of breath.         Marland Kitchen amitriptyline (ELAVIL) 50 MG tablet   Oral   Take 50 mg by mouth at bedtime.         . butalbital-acetaminophen-caffeine (FIORICET) 50-325-40 MG per tablet   Oral   Take 1-2 tablets by mouth every 6 (six) hours as needed for headache.   20 tablet   0   . diphenhydrAMINE (BENADRYL) 25 MG tablet   Oral   Take 25 mg by mouth every 6 (six) hours as needed for allergies.         . promethazine (PHENERGAN) 25 MG tablet   Oral   Take 1 tablet (25 mg total) by mouth every 6 (six) hours as needed for nausea.  15 tablet   0   . propranolol (INDERAL) 80 MG tablet   Oral   Take 80 mg by mouth 2 (two) times daily.          Marland Kitchen zolmitriptan (ZOMIG) 5 MG tablet   Oral   Take 5 mg by mouth as needed. Migraines, may take every 2 hours if needed, not exceeding 2 in a 24 hour period          BP 122/107  Pulse 91  Temp(Src) 98.3 F (36.8 C) (Oral)  Resp 20  Ht 5' (1.524 m)  Wt 149 lb (67.586 kg)  BMI 29.1 kg/m2  SpO2 100%  LMP 08/11/2012 Physical Exam  Nursing note and vitals reviewed. Constitutional: She is oriented to person, place, and time. She appears well-developed and well-nourished. No distress.  HENT:  Head: Normocephalic and atraumatic.  Mouth/Throat: Oropharynx is clear and moist.  Eyes: EOM are normal. Pupils are equal, round, and reactive to light.  There is no  papilledema on funduscopic examination.  Neck: Normal range of motion. Neck supple.  Cardiovascular: Normal rate and regular rhythm.  Exam reveals no gallop and no friction rub.   No murmur heard. Pulmonary/Chest: Effort normal and breath sounds normal. No respiratory distress. She has no wheezes.  Abdominal: Soft. Bowel sounds are normal. She exhibits no distension. There is no tenderness.  Musculoskeletal: Normal range of motion.  Neurological: She is alert and oriented to person, place, and time. No cranial nerve deficit. She exhibits normal muscle tone. Coordination normal.  Skin: Skin is warm and dry. She is not diaphoretic.    ED Course   Procedures (including critical care time)  Labs Reviewed  URINALYSIS, ROUTINE W REFLEX MICROSCOPIC  PREGNANCY, URINE   No results found. No diagnosis found.  MDM  Patient with a history of chronic pain issues, including migraines, low back pain, and abdominal pain related to her menstrual periods. She presents today with flank pain and severe headache that she says is unrelieved with her home medications. She appears clinically well and in no apparent discomfort.  Her neurologic exam is nonfocal and there is no papilledema on funduscopic examination. She tells me that she usually gets Dilaudid and Benadryl "for the itching" as this is what gives her the most relief. She was given 1 mg of Dilaudid along with Benadryl however this did not give her much relief.  A second dose is forthcoming along with Phenergan requested for her nausea.  After this second dose, I advised her she would be discharged and can followup with her neurologist. I see no indication for a CAT scan or lumbar puncture, and her flank pain appears very musculoskeletal.  There is no blood in the urine to suggest a kidney stone and no bacteria or white cells to suggest infection.  She is also not pregnant, thus ruling out ectopic and there are no vaginal complaints to suggest pid.  Her  abdomen is benign and without tenderness and I see no reason to perform any imaging studies.  Review of her chart reveals numerous ct scans and xrays over the past several years and I am reluctant to subject her to further radiation without a higher suspicion for intra-abdominal pathology.    I have encouraged her to discuss her frequent painful conditions with her pcp, so as to manage her pain and headaches at home and potentially cut down on future ER visits.  It appears as this advice was taken negatively as she then asked the  nurse to speak with the patient advocate about her care.  It is my suspicion that there is a component of drug-seeking behavior as she has multiple allergies to non-narcotic pain medications, then became unhappy and complained when I declined to give her additional doses of dilaudid.    Geoffery Lyons, MD 08/23/12 1559  Geoffery Lyons, MD 08/23/12 1610  Geoffery Lyons, MD 08/24/12 [phone]

## 2012-08-23 NOTE — ED Notes (Signed)
Headache, Pain rt flank, vomiting. No better with phenergan, Hx of migaines. And UTI's

## 2012-08-23 NOTE — ED Notes (Addendum)
Pt upset with EDP, states that she " didn't get good care", pt was talking about that she should've "pissed in the floor", pt talking about how she wasn't here just pain meds, IV d/c'd, pt refused to sign her d/c signature, d/c instructions reviewed with pt

## 2012-08-31 ENCOUNTER — Encounter (HOSPITAL_COMMUNITY): Payer: Self-pay

## 2012-08-31 ENCOUNTER — Emergency Department (HOSPITAL_COMMUNITY)
Admission: EM | Admit: 2012-08-31 | Discharge: 2012-08-31 | Disposition: A | Discharge status: Home / Self Care | Payer: Medicaid Other | Attending: Emergency Medicine | Admitting: Emergency Medicine

## 2012-08-31 ENCOUNTER — Emergency Department (HOSPITAL_COMMUNITY): Payer: Medicaid Other

## 2012-08-31 DIAGNOSIS — Z91040 Latex allergy status: Secondary | ICD-10-CM | POA: Insufficient documentation

## 2012-08-31 DIAGNOSIS — Z79899 Other long term (current) drug therapy: Secondary | ICD-10-CM | POA: Insufficient documentation

## 2012-08-31 DIAGNOSIS — K529 Noninfective gastroenteritis and colitis, unspecified: Secondary | ICD-10-CM

## 2012-08-31 DIAGNOSIS — Z3202 Encounter for pregnancy test, result negative: Secondary | ICD-10-CM | POA: Insufficient documentation

## 2012-08-31 DIAGNOSIS — Z8742 Personal history of other diseases of the female genital tract: Secondary | ICD-10-CM | POA: Insufficient documentation

## 2012-08-31 DIAGNOSIS — K5289 Other specified noninfective gastroenteritis and colitis: Secondary | ICD-10-CM | POA: Insufficient documentation

## 2012-08-31 DIAGNOSIS — R300 Dysuria: Secondary | ICD-10-CM | POA: Insufficient documentation

## 2012-08-31 DIAGNOSIS — R197 Diarrhea, unspecified: Secondary | ICD-10-CM | POA: Insufficient documentation

## 2012-08-31 DIAGNOSIS — G43909 Migraine, unspecified, not intractable, without status migrainosus: Secondary | ICD-10-CM | POA: Insufficient documentation

## 2012-08-31 DIAGNOSIS — J45909 Unspecified asthma, uncomplicated: Secondary | ICD-10-CM | POA: Insufficient documentation

## 2012-08-31 DIAGNOSIS — R51 Headache: Secondary | ICD-10-CM | POA: Insufficient documentation

## 2012-08-31 DIAGNOSIS — I10 Essential (primary) hypertension: Secondary | ICD-10-CM | POA: Insufficient documentation

## 2012-08-31 DIAGNOSIS — M545 Low back pain, unspecified: Secondary | ICD-10-CM | POA: Insufficient documentation

## 2012-08-31 DIAGNOSIS — Z9851 Tubal ligation status: Secondary | ICD-10-CM | POA: Insufficient documentation

## 2012-08-31 DIAGNOSIS — R6883 Chills (without fever): Secondary | ICD-10-CM | POA: Insufficient documentation

## 2012-08-31 DIAGNOSIS — R112 Nausea with vomiting, unspecified: Secondary | ICD-10-CM | POA: Insufficient documentation

## 2012-08-31 DIAGNOSIS — N83209 Unspecified ovarian cyst, unspecified side: Secondary | ICD-10-CM

## 2012-08-31 DIAGNOSIS — F17200 Nicotine dependence, unspecified, uncomplicated: Secondary | ICD-10-CM | POA: Insufficient documentation

## 2012-08-31 LAB — CBC WITH DIFFERENTIAL/PLATELET
Basophils Absolute: 0 10*3/uL (ref 0.0–0.1)
Basophils Relative: 0 % (ref 0–1)
Eosinophils Absolute: 0.2 10*3/uL (ref 0.0–0.7)
MCH: 28.5 pg (ref 26.0–34.0)
MCHC: 35.2 g/dL (ref 30.0–36.0)
Monocytes Relative: 7 % (ref 3–12)
Neutrophils Relative %: 52 % (ref 43–77)
Platelets: 210 10*3/uL (ref 150–400)
RDW: 13.2 % (ref 11.5–15.5)

## 2012-08-31 LAB — URINALYSIS, ROUTINE W REFLEX MICROSCOPIC
Bilirubin Urine: NEGATIVE
Hgb urine dipstick: NEGATIVE
Ketones, ur: NEGATIVE mg/dL
Specific Gravity, Urine: 1.01 (ref 1.005–1.030)
pH: 6.5 (ref 5.0–8.0)

## 2012-08-31 LAB — COMPREHENSIVE METABOLIC PANEL
ALT: 9 U/L (ref 0–35)
Alkaline Phosphatase: 49 U/L (ref 39–117)
BUN: 7 mg/dL (ref 6–23)
CO2: 27 mEq/L (ref 19–32)
GFR calc Af Amer: >90 mL/min (ref >90)
GFR calc non Af Amer: >90 mL/min (ref >90)
Glucose, Bld: 85 mg/dL (ref 70–99)
Potassium: 4.2 mEq/L (ref 3.5–5.1)
Sodium: 135 mEq/L (ref 135–145)
Total Bilirubin: 0.2 mg/dL — ABNORMAL LOW (ref 0.3–1.2)

## 2012-08-31 LAB — LIPASE, BLOOD: Lipase: 34 U/L (ref 11–59)

## 2012-08-31 MED ORDER — ONDANSETRON HCL 4 MG/2ML IJ SOLN
4.0000 mg | Freq: Once | INTRAMUSCULAR | Status: AC
  Administered 2012-08-31: 4 mg via INTRAVENOUS
  Filled 2012-08-31: qty 2

## 2012-08-31 MED ORDER — IOHEXOL 300 MG/ML  SOLN
50.0000 mL | Freq: Once | INTRAMUSCULAR | Status: AC | PRN
  Administered 2012-08-31: 50 mL via ORAL

## 2012-08-31 MED ORDER — HYDROCODONE-ACETAMINOPHEN 5-325 MG PO TABS: 1.0000 | ORAL_TABLET | Freq: Four times a day (QID) | ORAL | Status: DC | PRN

## 2012-08-31 MED ORDER — IOHEXOL 300 MG/ML  SOLN
100.0000 mL | Freq: Once | INTRAMUSCULAR | Status: AC | PRN
  Administered 2012-08-31: 100 mL via INTRAVENOUS

## 2012-08-31 MED ORDER — SODIUM CHLORIDE 0.9 % IV BOLUS (SEPSIS)
1000.0000 mL | Freq: Once | INTRAVENOUS | Status: AC
  Administered 2012-08-31: 1000 mL via INTRAVENOUS

## 2012-08-31 MED ORDER — PROMETHAZINE HCL 25 MG PO TABS: 25.0000 mg | ORAL_TABLET | Freq: Four times a day (QID) | ORAL | Status: DC | PRN

## 2012-08-31 MED ORDER — HYDROMORPHONE HCL PF 1 MG/ML IJ SOLN
1.0000 mg | Freq: Once | INTRAMUSCULAR | Status: AC
  Administered 2012-08-31: 1 mg via INTRAVENOUS
  Filled 2012-08-31: qty 1

## 2012-08-31 MED ORDER — LOPERAMIDE HCL 2 MG PO TABS: 2.0000 mg | ORAL_TABLET | Freq: Four times a day (QID) | ORAL | Status: DC | PRN

## 2012-08-31 MED ORDER — SODIUM CHLORIDE 0.9 % IV SOLN
INTRAVENOUS | Status: DC
  Administered 2012-08-31: 15:00:00 via INTRAVENOUS

## 2012-08-31 MED ORDER — PROMETHAZINE HCL 25 MG/ML IJ SOLN
12.5000 mg | Freq: Once | INTRAMUSCULAR | Status: AC
  Administered 2012-08-31: 12.5 mg via INTRAVENOUS
  Filled 2012-08-31: qty 1

## 2012-08-31 MED ORDER — DIPHENHYDRAMINE HCL 50 MG/ML IJ SOLN
25.0000 mg | Freq: Once | INTRAMUSCULAR | Status: AC
  Administered 2012-08-31: 25 mg via INTRAVENOUS
  Filled 2012-08-31: qty 1

## 2012-08-31 NOTE — ED Notes (Signed)
Pt c/o lower back pain that radiates to pelvic area x2-4 days. Pt also reports vomiting and diarrhea that began last night. Pt states "I feel pressure when I urinate and after I'm finished urinating I feel like I still have to go". Pt reports 1 episode of white vaginal discharge yesterday.

## 2012-08-31 NOTE — ED Provider Notes (Signed)
CSN: [id number]     Arrival date & time 08/31/12  1035 History  This chart was scribed for Shelda Jakes, MD by Bennett Scrape, ED Scribe. This patient was seen in room [facility]10/[facility] and the patient's care was started at 12:10 PM.   Chief Complaint  Patient presents with  . Abdominal Pain  . Diarrhea  . Back Pain    Patient is a 27 y.o. female presenting with back pain. The history is provided by the patient. No language interpreter was used.  Back Pain Location:  Lumbar spine Radiates to: lower abdomen. Onset quality:  Gradual Duration:  1 week Timing:  Constant Progression:  Worsening Associated symptoms: abdominal pain, dysuria and headaches   Associated symptoms: no chest pain and no fever     HPI Comments: Tanya Krause is a 27 y.o. female who presents to the Emergency Department complaining of lower back pain that radiates around to the lower abdomen. She states that the back pain has been present for the past week but started radiating yesterday. She lists HA located behind bilateral eyes that radiates into the neck, chills, nausea, diarrhea and emesis as associated symptoms that started this morning. She reports 4 episodes of emesis and diarrhea presenting together since the onset. She rates her pain a 10 out of 10 currently and reports that it is aggravated with bending over and movement. She has a h/o prior pelvic infections but states that she had a moderate amount of malodorous vaginal discharge during those episodes. She reports mild dysuria but denies discharge today. She also denies hematemesis and hematochezia as associated symptoms.   PCP is [facility]  Past Medical History  Diagnosis Date  . Hypertension   . Migraine   . Asthma   . Pelvic inflammatory disease (PID)    Past Surgical History  Procedure Laterality Date  . Tubal ligation    . Kidney infections    . Wisdom tooth extraction     Family History  Problem Relation Age of  Onset  . Hypertension Mother   . Diabetes Mother   . Heart failure Father    History  Substance Use Topics  . Smoking status: Current Some Day Smoker    Types: Cigarettes  . Smokeless tobacco: Never Used  . Alcohol Use: Yes     Comment: occasionally-once a month   OB History   Grav Para Term Preterm Abortions TAB SAB Ect Mult Living   4 4 1 3      4      Review of Systems  Constitutional: Positive for chills. Negative for fever.  HENT: Negative for congestion, sore throat and neck pain.   Eyes: Negative for visual disturbance.  Respiratory: Negative for cough and shortness of breath.   Cardiovascular: Negative for chest pain and leg swelling.  Gastrointestinal: Positive for nausea, vomiting, abdominal pain and diarrhea. Negative for blood in stool.  Genitourinary: Positive for dysuria.  Musculoskeletal: Positive for back pain.  Skin: Negative for rash.  Neurological: Positive for headaches.  Hematological: Does not bruise/bleed easily.  Psychiatric/Behavioral: Negative for confusion.    Allergies  Tomato; Aspirin; Doxycycline; Hydromorphone hcl; Ibuprofen; Metoclopramide; Other; Shellfish allergy; Toradol; and Latex  Home Medications   Current Outpatient Rx  Name  Route  Sig  Dispense  Refill  . acetaminophen (TYLENOL) 500 MG tablet   Oral   Take 500 mg by mouth every 6 (six) hours as needed for pain.          Marland Kitchen  amitriptyline (ELAVIL) 50 MG tablet   Oral   Take 50 mg by mouth at bedtime.         . butalbital-acetaminophen-caffeine (FIORICET) 50-325-40 MG per tablet   Oral   Take 1-2 tablets by mouth every 6 (six) hours as needed for headache.   20 tablet   0   . diphenhydrAMINE (BENADRYL) 25 MG tablet   Oral   Take 25 mg by mouth at bedtime as needed for allergies.          . promethazine (PHENERGAN) 25 MG tablet   Oral   Take 1 tablet (25 mg total) by mouth every 6 (six) hours as needed for nausea.   15 tablet   0   . propranolol (INDERAL) 80 MG  tablet   Oral   Take 80 mg by mouth 2 (two) times daily.          Marland Kitchen albuterol (PROVENTIL HFA;VENTOLIN HFA) 108 (90 BASE) MCG/ACT inhaler   Inhalation   Inhale 2 puffs into the lungs every 6 (six) hours as needed for wheezing or shortness of breath.         Marland Kitchen HYDROcodone-acetaminophen (NORCO/VICODIN) 5-325 MG per tablet   Oral   Take 1-2 tablets by mouth every 6 (six) hours as needed for pain.   14 tablet   0   . loperamide (IMODIUM A-D) 2 MG tablet   Oral   Take 1 tablet (2 mg total) by mouth 4 (four) times daily as needed for diarrhea or loose stools.   30 tablet   0   . promethazine (PHENERGAN) 25 MG tablet   Oral   Take 1 tablet (25 mg total) by mouth every 6 (six) hours as needed for nausea.   12 tablet   0   . zolmitriptan (ZOMIG) 5 MG tablet   Oral   Take 5 mg by mouth as needed. Migraines, may take every 2 hours if needed, not exceeding 2 in a 24 hour period          Triage Vitals: BP 136/102  Pulse 83  Temp(Src) 98.6 F (37 C) (Oral)  Resp 20  Ht 5' (1.524 m)  Wt 149 lb (67.586 kg)  BMI 29.1 kg/m2  SpO2 100%  LMP 08/11/2012  Physical Exam  Nursing note and vitals reviewed. Constitutional: She is oriented to person, place, and time. She appears well-developed and well-nourished. No distress.  HENT:  Head: Normocephalic and atraumatic.  Mouth/Throat: Oropharynx is clear and moist.  Eyes: Conjunctivae and EOM are normal. Pupils are equal, round, and reactive to light.  Sclera are clear  Neck: Neck supple. No tracheal deviation present.  Cardiovascular: Normal rate and regular rhythm.   No murmur heard. Pulses:      Dorsalis pedis pulses are 2+ on the right side, and 2+ on the left side.  Pulmonary/Chest: Effort normal and breath sounds normal. No respiratory distress. She has no wheezes.  Abdominal: Soft. Bowel sounds are normal. She exhibits no distension. There is tenderness (moderately tender in the lower quadrants).  Musculoskeletal: Normal  range of motion. She exhibits no edema (no ankle swelling).  Lymphadenopathy:    She has no cervical adenopathy.  Neurological: She is alert and oriented to person, place, and time. No cranial nerve deficit.  Pt able to move both sets of fingers and toes  Skin: Skin is warm and dry. No rash noted.  Psychiatric: She has a normal mood and affect. Her behavior is normal.    ED  Course   Medications  sodium chloride 0.9 % bolus 1,000 mL (0 mL Intravenous Stopped 08/31/12 1411)  ondansetron (ZOFRAN) injection 4 mg (4 mg Intravenous Given 08/31/12 1250)  iohexol (OMNIPAQUE) 300 MG/ML solution 50 mL (50 mL Oral Contrast Given 08/31/12 1303)  iohexol (OMNIPAQUE) 300 MG/ML solution 100 mL (100 mL Intravenous Contrast Given 08/31/12 1425)  HYDROmorphone (DILAUDID) injection 1 mg (1 mg Intravenous Given 08/31/12 1333)  diphenhydrAMINE (BENADRYL) injection 25 mg (25 mg Intravenous Given 08/31/12 1332)  promethazine (PHENERGAN) injection 12.5 mg (12.5 mg Intravenous Given 08/31/12 1412)  HYDROmorphone (DILAUDID) injection 1 mg (1 mg Intravenous Given 08/31/12 1502)    DIAGNOSTIC STUDIES: Oxygen Saturation is 100% on room air, normal by my interpretation.    COORDINATION OF CARE: 12:17 PM-Discussed treatment plan which includes medications, CT of abdomen, CBC panel, CMP and UA with pt at bedside and pt agreed to plan.   Procedures (including critical care time)  Labs Reviewed  COMPREHENSIVE METABOLIC PANEL - Abnormal; Notable for the following:    Total Bilirubin 0.2 (*)    All other components within normal limits  URINALYSIS, ROUTINE W REFLEX MICROSCOPIC  PREGNANCY, URINE  LIPASE, BLOOD  CBC WITH DIFFERENTIAL   Results for orders placed during the hospital encounter of 08/31/12  URINALYSIS, ROUTINE W REFLEX MICROSCOPIC      Result Value Range   Color, Urine YELLOW  YELLOW   APPearance CLEAR  CLEAR   Specific Gravity, Urine 1.010  1.005 - 1.030   pH 6.5  5.0 - 8.0   Glucose, UA NEGATIVE   NEGATIVE mg/dL   Hgb urine dipstick NEGATIVE  NEGATIVE   Bilirubin Urine NEGATIVE  NEGATIVE   Ketones, ur NEGATIVE  NEGATIVE mg/dL   Protein, ur NEGATIVE  NEGATIVE mg/dL   Urobilinogen, UA 0.2  0.0 - 1.0 mg/dL   Nitrite NEGATIVE  NEGATIVE   Leukocytes, UA NEGATIVE  NEGATIVE  PREGNANCY, URINE      Result Value Range   Preg Test, Ur NEGATIVE  NEGATIVE  COMPREHENSIVE METABOLIC PANEL      Result Value Range   Sodium 135  135 - 145 mEq/L   Potassium 4.2  3.5 - 5.1 mEq/L   Chloride 100  96 - 112 mEq/L   CO2 27  19 - 32 mEq/L   Glucose, Bld 85  70 - 99 mg/dL   BUN 7  6 - 23 mg/dL   Creatinine, Ser 1.61  0.50 - 1.10 mg/dL   Calcium 9.9  8.4 - 09.6 mg/dL   Total Protein 7.9  6.0 - 8.3 g/dL   Albumin 4.0  3.5 - 5.2 g/dL   AST 17  0 - 37 U/L   ALT 9  0 - 35 U/L   Alkaline Phosphatase 49  39 - 117 U/L   Total Bilirubin 0.2 (*) 0.3 - 1.2 mg/dL   GFR calc non Af Amer >90  >90 mL/min   GFR calc Af Amer >90  >90 mL/min  LIPASE, BLOOD      Result Value Range   Lipase 34  11 - 59 U/L  CBC WITH DIFFERENTIAL      Result Value Range   WBC 8.7  4.0 - 10.5 K/uL   RBC 4.70  3.87 - 5.11 MIL/uL   Hemoglobin 13.4  12.0 - 15.0 g/dL   HCT 04.5  40.9 - 81.1 %   MCV 81.1  78.0 - 100.0 fL   MCH 28.5  26.0 - 34.0 pg   MCHC 35.2  30.0 - 36.0 g/dL   RDW 40.1  02.7 - 25.3 %   Platelets 210  150 - 400 K/uL   Neutrophils Relative % 52  43 - 77 %   Neutro Abs 4.6  1.7 - 7.7 K/uL   Lymphocytes Relative 39  12 - 46 %   Lymphs Abs 3.4  0.7 - 4.0 K/uL   Monocytes Relative 7  3 - 12 %   Monocytes Absolute 0.6  0.1 - 1.0 K/uL   Eosinophils Relative 2  0 - 5 %   Eosinophils Absolute 0.2  0.0 - 0.7 K/uL   Basophils Relative 0  0 - 1 %   Basophils Absolute 0.0  0.0 - 0.1 K/uL      Ct Abdomen Pelvis W Contrast  08/31/2012   *RADIOLOGY REPORT*  Clinical Data: abdominal pain, diarrhea, back pain for one day; also dysuria and vomiting  CT ABDOMEN AND PELVIS WITH CONTRAST  Technique:  Multidetector CT imaging  of the abdomen and pelvis was performed following the standard protocol during bolus administration of intravenous contrast.  Contrast: 50mL OMNIPAQUE IOHEXOL 300 MG/ML  SOLN, OMNIPAQUE IOHEXOL 300 MG/ML  SOLN  Comparison: 01/02/12  Findings: Visualized portions of the lung bases are clear.  No acute musculoskeletal abnormalities.  Focal fatty infiltration of the liver adjacent to the falciform ligament stable.  Ill-defined approximately 2 cm focus of low attenuation lateral right lobe of the liver, stable.  This is seen best on image number 21.  Spleen, pancreas, kidneys, and adrenal glands normal.  Bowel and appendix normal.  Bladder normal.  Uterus normal.  Right ovary shows no abnormalities.  There are numerous peripherally enhancing low attenuation structures in the left adnexal region likely involving the ovary.  The largest is approximately 21 mm.  There is a small volume of free fluid in the cul-de-sac of the pelvis.  IMPRESSION:  1.  Enhancing cystic lesions left ovary with small volume free fluid in the cul-de-sac.  Finding suggest rupture of a hemorrhagic cyst.  This would be better characterized with pelvic ultrasound. 2.  Area of low attenuation of lateral right lobe of liver stable from prior studies.  This could represent an area of fatty infiltration or thrombosed vessel.  Given the stability since 01/02/2012, this is felt to be likely benign.  It would be better characterized however by non emergent hepatic MRI.   Original Report Authenticated By: Esperanza Heir, M.D.   1. Gastroenteritis   2. Ovarian cyst     MDM  Patient's workup without significant findings other than the finding of an ovarian cysts that may be ruptured with fluid may be hemorrhagic. Patient referred to OB/GYN for followup. The other symptoms seem to be consistent with a gastroenteritis that she has nausea vomiting and diarrhea treated with Imodium and Phenergan for that. Patient does not have a significant vaginal  discharge by history patient has had PID in the past but she has a systolic feel like that. Would be unusual for that to give her nausea vomiting and diarrhea together. Patient improved upon discharge nontoxic no acute distress. Patient without leukocytosis labs are normal urinalysis negative pregnancy test negative.  I personally performed the services described in this documentation, which was scribed in my presence. The recorded information has been reviewed and is accurate.     Shelda Jakes, MD 09/01/12 [phone]

## 2012-08-31 NOTE — ED Notes (Signed)
Pt reports n/v, abd pain that radiates to her back for several days.  Pt also reports noticing some white discharge.  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

## 2012-09-07 ENCOUNTER — Ambulatory Visit (INDEPENDENT_AMBULATORY_CARE_PROVIDER_SITE_OTHER): Payer: Medicaid Other | Admitting: Obstetrics and Gynecology

## 2012-09-07 ENCOUNTER — Encounter: Payer: Self-pay | Admitting: Obstetrics and Gynecology

## 2012-09-07 VITALS — BP 120/80 | Ht 60.0 in | Wt 143.0 lb

## 2012-09-07 DIAGNOSIS — G44229 Chronic tension-type headache, not intractable: Secondary | ICD-10-CM | POA: Insufficient documentation

## 2012-09-07 MED ORDER — METAXALONE 800 MG PO TABS: 800.0000 mg | ORAL_TABLET | Freq: Three times a day (TID) | ORAL | Status: DC

## 2012-09-07 MED ORDER — PROMETHAZINE HCL 25 MG PO TABS: 25.0000 mg | ORAL_TABLET | Freq: Four times a day (QID) | ORAL | Status: DC | PRN

## 2012-09-07 NOTE — Progress Notes (Signed)
Patient ID: Tanya Krause, female   DOB: Jul 31, 1985, 27 y.o.   MRN: [id number] Pt here today for ovarian cyst and migraines. Pt states medication is no longer working. Pt wants to discuss this problem with you.   [facility] Visit  Patient name: Tanya Krause MRN [id number]  Date of birth: 11/19/1985  CC & HnoPI:  Tanya Krause is a 27 y.o. 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 Saw dr Marjory Lies q3 mo, Rx'd c IV infusions and had h/a less frequently. Pt now has transportation probs and lost Medicaid until recently restarted.  Pt alleges that insomnia an issue. Pt had "injections' in office that helped headaches (trigger point injections).   ROS:  Not sexually active at present. Seen in ED for abd pain: CT showed probable functional cyst of ovary.  Pertinent History Reviewed:  Medical & Surgical Hx:  Reviewed: Significant for multiple meds for ha, phenergan helps. Medications: Reviewed & Updated - see associated section Social History: Reviewed -  reports that she has been smoking Cigarettes.  She has been smoking about 0.00 packs per day. She has never used smokeless tobacco.  Objective Findings:  Vitals: BP 120/80  Ht 5' (1.524 m)  Wt 143 lb (64.864 kg)  BMI 27.93 kg/m2  LMP 08/11/2012  Physical Examination: General appearance - alert, well appearing, and in no distress, oriented to person, place, and time and no discomfort. Mental status - alert, oriented to person, place, and time, normal mood, behavior, speech, dress, motor activity, and thought processes Eyes - pupils equal and reactive, extraocular eye movements intact Neck - supple, no significant adenopathy   Assessment & Plan:  Tension H/a's Resolved functional cysts.   Refer to Dr Gerilyn Pilgrim Interval refil of Phenergan and will Rx Skelaxin for tension h/a.

## 2012-09-07 NOTE — Patient Instructions (Signed)
Use skelaxin as needed for tension headaches, remember to use heat pad, muscle massage. Avoid driving if on h/a meds.

## 2012-09-10 ENCOUNTER — Other Ambulatory Visit: Payer: Self-pay | Admitting: Obstetrics & Gynecology

## 2012-09-11 ENCOUNTER — Telehealth: Payer: Self-pay | Admitting: Obstetrics and Gynecology

## 2012-09-11 ENCOUNTER — Encounter (HOSPITAL_COMMUNITY): Payer: Self-pay | Admitting: *Deleted

## 2012-09-11 ENCOUNTER — Emergency Department (HOSPITAL_COMMUNITY)
Admission: EM | Admit: 2012-09-11 | Discharge: 2012-09-11 | Disposition: A | Discharge status: Home / Self Care | Payer: Medicaid Other | Attending: Emergency Medicine | Admitting: Emergency Medicine

## 2012-09-11 DIAGNOSIS — G479 Sleep disorder, unspecified: Secondary | ICD-10-CM | POA: Insufficient documentation

## 2012-09-11 DIAGNOSIS — R197 Diarrhea, unspecified: Secondary | ICD-10-CM | POA: Insufficient documentation

## 2012-09-11 DIAGNOSIS — Z8742 Personal history of other diseases of the female genital tract: Secondary | ICD-10-CM | POA: Insufficient documentation

## 2012-09-11 DIAGNOSIS — M549 Dorsalgia, unspecified: Secondary | ICD-10-CM | POA: Insufficient documentation

## 2012-09-11 DIAGNOSIS — Z79899 Other long term (current) drug therapy: Secondary | ICD-10-CM | POA: Insufficient documentation

## 2012-09-11 DIAGNOSIS — G43909 Migraine, unspecified, not intractable, without status migrainosus: Secondary | ICD-10-CM | POA: Insufficient documentation

## 2012-09-11 DIAGNOSIS — R1110 Vomiting, unspecified: Secondary | ICD-10-CM | POA: Insufficient documentation

## 2012-09-11 DIAGNOSIS — Z3202 Encounter for pregnancy test, result negative: Secondary | ICD-10-CM | POA: Insufficient documentation

## 2012-09-11 DIAGNOSIS — J45909 Unspecified asthma, uncomplicated: Secondary | ICD-10-CM | POA: Insufficient documentation

## 2012-09-11 DIAGNOSIS — Z91040 Latex allergy status: Secondary | ICD-10-CM | POA: Insufficient documentation

## 2012-09-11 DIAGNOSIS — I10 Essential (primary) hypertension: Secondary | ICD-10-CM | POA: Insufficient documentation

## 2012-09-11 LAB — URINALYSIS, ROUTINE W REFLEX MICROSCOPIC
Glucose, UA: NEGATIVE mg/dL
Ketones, ur: NEGATIVE mg/dL
Leukocytes, UA: NEGATIVE
Protein, ur: NEGATIVE mg/dL
Urobilinogen, UA: 0.2 mg/dL (ref 0.0–1.0)

## 2012-09-11 LAB — URINE MICROSCOPIC-ADD ON

## 2012-09-11 LAB — PREGNANCY, URINE: Preg Test, Ur: NEGATIVE

## 2012-09-11 MED ORDER — CYCLOBENZAPRINE HCL 10 MG PO TABS
10.0000 mg | ORAL_TABLET | Freq: Once | ORAL | Status: AC
  Administered 2012-09-11: 10 mg via ORAL
  Filled 2012-09-11: qty 1

## 2012-09-11 MED ORDER — CYCLOBENZAPRINE HCL 10 MG PO TABS: 10.0000 mg | ORAL_TABLET | Freq: Three times a day (TID) | ORAL | Status: DC | PRN

## 2012-09-11 MED ORDER — OXYCODONE-ACETAMINOPHEN 5-325 MG PO TABS
2.0000 | ORAL_TABLET | Freq: Once | ORAL | Status: AC
  Administered 2012-09-11: 2 via ORAL
  Filled 2012-09-11: qty 2

## 2012-09-11 NOTE — ED Notes (Signed)
Went to Dr. Emelda Fear on Friday for back pain and migraines.  Given meds, but they haven't been approved by Medicaid.  Called MD today and was told to come to ER for con't. Pain.  Pain in R cervical area radiating to R thoracic which has been ongoing for 1 year, but has gotten significantly worse since Saturday.  Has not had radiographs of spine.  Nausea and vomiting started today.  Vomited x 2.  No fevers.  No known injury.

## 2012-09-11 NOTE — Telephone Encounter (Signed)
Spoke with pt. Dr. Emelda Fear Rx Skelaxin. Hasn't got med yet because of Medicaid. Has had muscle spasms since Saturday. Has been on Flexeril in the past and it don't help. Please help! Thanks!!!

## 2012-09-11 NOTE — ED Notes (Signed)
Pt states that boyfriend to drive her home at discharge

## 2012-09-11 NOTE — ED Provider Notes (Signed)
CSN: [id number]     Arrival date & time 09/11/12  1513 History  This chart was scribed for Charles B. Bernette Mayers, MD,  by Ashley Jacobs, ED Scribe. The patient was seen in room APA15/APA15 and the patient's care was started at 3:50 PM.  First MD Initiated Contact with Patient 09/11/12 1545     Chief Complaint  Patient presents with  . Back Pain   (Consider location/radiation/quality/duration/timing/severity/associated sxs/prior Treatment) Patient is a 27 y.o. female presenting with back pain. The history is provided by the patient and medical records. No language interpreter was used.  Back Pain Location:  Thoracic spine Pain severity:  Severe Pain is:  Same all the time Onset quality:  Gradual Duration:  12 months Timing:  Constant Progression:  Worsening Chronicity:  Recurrent Relieved by:  Nothing Worsened by:  Nothing tried Ineffective treatments:  OTC medications  HPI Comments: Tanya Krause is a 27 y.o. female who presents to the Emergency Department complaining of severe radiating back pain with initial onset of one year ago and has been worsening for the past four days. Pt visited Dr. Emelda Fear four days ago for back pain and migraines and was given a prescription for Skelaxin. However, due to complications with Medicaid she is unable to fill her prescription so she has been taking Tylenol Extra Strength with no relief. She mentions that she has the associated symptom of sleep disturbances, one episode of nausea and vomiting. Pt has a hx of hypertension.  She works as a Theatre manager.  Past Medical History  Diagnosis Date  . Hypertension   . Migraine   . Asthma   . Pelvic inflammatory disease (PID)    Past Surgical History  Procedure Laterality Date  . Tubal ligation    . Kidney infections    . Wisdom tooth extraction     Family History  Problem Relation Age of Onset  . Hypertension Mother   . Diabetes Mother   . Heart failure Father    History   Substance Use Topics  . Smoking status: Light Tobacco Smoker    Types: Cigarettes  . Smokeless tobacco: Never Used     Comment: Occasional smoker  . Alcohol Use: Yes     Comment: occasionally-once a month   OB History   Grav Para Term Preterm Abortions TAB SAB Ect Mult Living   4 4 1 3      4      Review of Systems  Gastrointestinal: Positive for vomiting and diarrhea.  Musculoskeletal: Positive for back pain.  Psychiatric/Behavioral: Positive for sleep disturbance.  All other systems reviewed and are negative.    Allergies  Tomato; Aspirin; Doxycycline; Hydromorphone hcl; Ibuprofen; Metoclopramide; Other; Shellfish allergy; Toradol; and Latex  Home Medications   Current Outpatient Rx  Name  Route  Sig  Dispense  Refill  . acetaminophen (TYLENOL) 500 MG tablet   Oral   Take 500 mg by mouth every 6 (six) hours as needed for pain.          Marland Kitchen albuterol (PROVENTIL HFA;VENTOLIN HFA) 108 (90 BASE) MCG/ACT inhaler   Inhalation   Inhale 2 puffs into the lungs every 6 (six) hours as needed for wheezing or shortness of breath.         Marland Kitchen amitriptyline (ELAVIL) 50 MG tablet   Oral   Take 50 mg by mouth at bedtime.         . butalbital-acetaminophen-caffeine (FIORICET) 50-325-40 MG per tablet   Oral  Take 1-2 tablets by mouth every 6 (six) hours as needed for headache.   20 tablet   0   . diphenhydrAMINE (BENADRYL) 25 MG tablet   Oral   Take 25 mg by mouth at bedtime as needed for allergies.          Marland Kitchen HYDROcodone-acetaminophen (NORCO/VICODIN) 5-325 MG per tablet   Oral   Take 1-2 tablets by mouth every 6 (six) hours as needed for pain.   14 tablet   0   . loperamide (IMODIUM A-D) 2 MG tablet   Oral   Take 1 tablet (2 mg total) by mouth 4 (four) times daily as needed for diarrhea or loose stools.   30 tablet   0   . metaxalone (SKELAXIN) 800 MG tablet   Oral   Take 1 tablet (800 mg total) by mouth 3 (three) times daily.   60 tablet   2   . promethazine  (PHENERGAN) 25 MG tablet   Oral   Take 1 tablet (25 mg total) by mouth every 6 (six) hours as needed for nausea.   15 tablet   2   . propranolol (INDERAL) 80 MG tablet   Oral   Take 80 mg by mouth 2 (two) times daily.          Marland Kitchen zolmitriptan (ZOMIG) 5 MG tablet   Oral   Take 5 mg by mouth as needed. Migraines, may take every 2 hours if needed, not exceeding 2 in a 24 hour period          BP 156/104  Pulse 96  Temp(Src) 98.2 F (36.8 C) (Oral)  Resp 18  Ht 5' (1.524 m)  Wt 148 lb (67.132 kg)  BMI 28.9 kg/m2  SpO2 100%  LMP 09/08/2012 Physical Exam  Nursing note and vitals reviewed. Constitutional: She is oriented to person, place, and time. She appears well-developed and well-nourished.  HENT:  Head: Normocephalic and atraumatic.  Eyes: EOM are normal. Pupils are equal, round, and reactive to light.  Neck: Normal range of motion. Neck supple.  Cardiovascular: Normal rate, normal heart sounds and intact distal pulses.   Pulmonary/Chest: Effort normal and breath sounds normal.  Abdominal: Bowel sounds are normal. She exhibits no distension. There is no tenderness.  Musculoskeletal: Normal range of motion. She exhibits tenderness. She exhibits no edema.  Tenderness to light touch over right back   Neurological: She is alert and oriented to person, place, and time. She has normal strength. No cranial nerve deficit or sensory deficit.  Skin: Skin is warm and dry. No rash noted.  Psychiatric: She has a normal mood and affect.    ED Course  Procedures (including critical care time) Labs Review Labs Reviewed  PREGNANCY, URINE  URINALYSIS, ROUTINE W REFLEX MICROSCOPIC   Imaging Review No results found.  MDM   1. Back pain    [facility] back pain, offered Flexeril and PCP followup. UA and hcg ordered in triage reviewed and neg.     Charles B. Bernette Mayers, MD 09/11/12 1910

## 2012-09-11 NOTE — ED Notes (Signed)
MD at bedside. 

## 2012-09-12 NOTE — Telephone Encounter (Signed)
Rx faxed to Pharmacy for generic skelaxin.

## 2012-09-14 NOTE — Telephone Encounter (Signed)
Call to pharmacy, patient has filled her flexeril rx previously approved, as even generic Skelaxin is "Prior Authorization" Skelaxin " metaxolone " Rx Cancelled

## 2012-09-15 NOTE — Telephone Encounter (Signed)
Phone call to pharmacy yesterday; pt has already begun new rx flexeril.   Skelaxin cancelled

## 2012-10-02 ENCOUNTER — Emergency Department (HOSPITAL_COMMUNITY)
Admission: EM | Admit: 2012-10-02 | Discharge: 2012-10-03 | Disposition: A | Discharge status: Home / Self Care | Payer: Medicaid Other | Attending: Emergency Medicine | Admitting: Emergency Medicine

## 2012-10-02 ENCOUNTER — Encounter (HOSPITAL_COMMUNITY): Payer: Self-pay | Admitting: Emergency Medicine

## 2012-10-02 ENCOUNTER — Telehealth: Payer: Self-pay | Admitting: Diagnostic Neuroimaging

## 2012-10-02 DIAGNOSIS — Z8742 Personal history of other diseases of the female genital tract: Secondary | ICD-10-CM | POA: Insufficient documentation

## 2012-10-02 DIAGNOSIS — Z79899 Other long term (current) drug therapy: Secondary | ICD-10-CM | POA: Insufficient documentation

## 2012-10-02 DIAGNOSIS — K529 Noninfective gastroenteritis and colitis, unspecified: Secondary | ICD-10-CM

## 2012-10-02 DIAGNOSIS — J45909 Unspecified asthma, uncomplicated: Secondary | ICD-10-CM | POA: Insufficient documentation

## 2012-10-02 DIAGNOSIS — F17200 Nicotine dependence, unspecified, uncomplicated: Secondary | ICD-10-CM | POA: Insufficient documentation

## 2012-10-02 DIAGNOSIS — K5289 Other specified noninfective gastroenteritis and colitis: Secondary | ICD-10-CM | POA: Insufficient documentation

## 2012-10-02 DIAGNOSIS — I10 Essential (primary) hypertension: Secondary | ICD-10-CM | POA: Insufficient documentation

## 2012-10-02 DIAGNOSIS — Z3202 Encounter for pregnancy test, result negative: Secondary | ICD-10-CM | POA: Insufficient documentation

## 2012-10-02 DIAGNOSIS — G43909 Migraine, unspecified, not intractable, without status migrainosus: Secondary | ICD-10-CM | POA: Insufficient documentation

## 2012-10-02 DIAGNOSIS — Z91040 Latex allergy status: Secondary | ICD-10-CM | POA: Insufficient documentation

## 2012-10-02 LAB — BASIC METABOLIC PANEL
CO2: 27 mEq/L (ref 19–32)
Chloride: 101 mEq/L (ref 96–112)
Potassium: 3.6 mEq/L (ref 3.5–5.1)
Sodium: 135 mEq/L (ref 135–145)

## 2012-10-02 LAB — CBC WITH DIFFERENTIAL/PLATELET
Basophils Absolute: 0 10*3/uL (ref 0.0–0.1)
Lymphocytes Relative: 47 % — ABNORMAL HIGH (ref 12–46)
Neutro Abs: 3.8 10*3/uL (ref 1.7–7.7)
Platelets: 201 10*3/uL (ref 150–400)
RDW: 13.1 % (ref 11.5–15.5)
WBC: 9 10*3/uL (ref 4.0–10.5)

## 2012-10-02 LAB — PREGNANCY, URINE: Preg Test, Ur: NEGATIVE

## 2012-10-02 LAB — URINALYSIS, ROUTINE W REFLEX MICROSCOPIC
Nitrite: NEGATIVE
Specific Gravity, Urine: 1.02 (ref 1.005–1.030)
Urobilinogen, UA: 0.2 mg/dL (ref 0.0–1.0)

## 2012-10-02 LAB — URINE MICROSCOPIC-ADD ON

## 2012-10-02 MED ORDER — PROMETHAZINE HCL 25 MG/ML IJ SOLN
12.5000 mg | Freq: Once | INTRAMUSCULAR | Status: AC
  Administered 2012-10-02: 12.5 mg via INTRAVENOUS
  Filled 2012-10-02: qty 1

## 2012-10-02 MED ORDER — ONDANSETRON HCL 4 MG/2ML IJ SOLN
4.0000 mg | Freq: Once | INTRAMUSCULAR | Status: AC
  Administered 2012-10-02: 4 mg via INTRAVENOUS
  Filled 2012-10-02: qty 2

## 2012-10-02 NOTE — ED Notes (Signed)
Pt c/o vomiting and diarrhea since yesterday. Pt also c/o lower back pain and headache.

## 2012-10-02 NOTE — ED Provider Notes (Signed)
CSN: [id number]     Arrival date & time 10/02/12  1755 History  This chart was scribed for Geoffery Lyons, MD by Bennett Scrape, ED Scribe. This patient was seen in room APA04/APA04 and the patient's care was started at 10:56 PM.  Chief Complaint  Patient presents with  . Emesis  . Diarrhea    The history is provided by the patient. No language interpreter was used.    HPI Comments: Tanya Krause is a 27 y.o. female who presents to the Emergency Department complaining of several episodes of non-bloody emesis with associated diarrhea and abdominal pain that started yesterday morning. She describes the abdominal pain as a diffuse cramping that radiates into her lower back. She states that she has been [employer] with no improvement. Pt denies having any sick contacts with similar symptoms. She also c/o a secondary and tertiary complaint of a HA described as a migraine and suprapubic abdominal pain that both started today. She denies any fevers, urinary symptoms and visual disturbances as associated symptoms. LNMP was last week.    Past Medical History  Diagnosis Date  . Hypertension   . Migraine   . Asthma   . Pelvic inflammatory disease (PID)    Past Surgical History  Procedure Laterality Date  . Tubal ligation    . Kidney infections    . Wisdom tooth extraction     Family History  Problem Relation Age of Onset  . Hypertension Mother   . Diabetes Mother   . Heart failure Father    History  Substance Use Topics  . Smoking status: Light Tobacco Smoker    Types: Cigarettes  . Smokeless tobacco: Never Used     Comment: Occasional smoker  . Alcohol Use: Yes     Comment: occasionally-once a month   OB History   Grav Para Term Preterm Abortions TAB SAB Ect Mult Living   4 4 1 3      4      Review of Systems  Constitutional: Negative for fever and chills.  Eyes: Negative for visual disturbance.  Gastrointestinal: Positive for nausea, vomiting, abdominal pain and diarrhea.  Negative for blood in stool.  Genitourinary: Negative for dysuria.  Musculoskeletal: Positive for back pain.  Neurological: Positive for headaches. Negative for dizziness.  All other systems reviewed and are negative.    Allergies  Tomato; Aspirin; Doxycycline; Hydromorphone hcl; Ibuprofen; Metoclopramide; Other; Shellfish allergy; Toradol; and Latex  Home Medications   Current Outpatient Rx  Name  Route  Sig  Dispense  Refill  . acetaminophen (TYLENOL) 500 MG tablet   Oral   Take 500 mg by mouth every 6 (six) hours as needed for pain.          Marland Kitchen albuterol (PROVENTIL HFA;VENTOLIN HFA) 108 (90 BASE) MCG/ACT inhaler   Inhalation   Inhale 2 puffs into the lungs every 6 (six) hours as needed for wheezing or shortness of breath.         Marland Kitchen amitriptyline (ELAVIL) 50 MG tablet   Oral   Take 50 mg by mouth at bedtime.         . butalbital-acetaminophen-caffeine (FIORICET) 50-325-40 MG per tablet   Oral   Take 1-2 tablets by mouth every 6 (six) hours as needed for headache.   20 tablet   0   . cyclobenzaprine (FLEXERIL) 10 MG tablet   Oral   Take 1 tablet (10 mg total) by mouth 3 (three) times daily as needed for muscle spasms.  30 tablet   0   . diphenhydrAMINE (BENADRYL) 25 MG tablet   Oral   Take 25 mg by mouth at bedtime as needed for allergies.          Marland Kitchen HYDROcodone-acetaminophen (NORCO/VICODIN) 5-325 MG per tablet   Oral   Take 1-2 tablets by mouth every 6 (six) hours as needed for pain.   14 tablet   0   . loperamide (IMODIUM A-D) 2 MG tablet   Oral   Take 1 tablet (2 mg total) by mouth 4 (four) times daily as needed for diarrhea or loose stools.   30 tablet   0   . metaxalone (SKELAXIN) 800 MG tablet   Oral   Take 1 tablet (800 mg total) by mouth 3 (three) times daily.   60 tablet   2   . promethazine (PHENERGAN) 25 MG tablet   Oral   Take 1 tablet (25 mg total) by mouth every 6 (six) hours as needed for nausea.   15 tablet   2   .  propranolol (INDERAL) 80 MG tablet   Oral   Take 80 mg by mouth 2 (two) times daily.          Marland Kitchen zolmitriptan (ZOMIG) 5 MG tablet   Oral   Take 5 mg by mouth as needed. Migraines, may take every 2 hours if needed, not exceeding 2 in a 24 hour period          Triage Vitals: BP 141/84  Pulse 88  Temp(Src) 98 F (36.7 C) (Oral)  Resp 20  Ht 5' (1.524 m)  Wt 152 lb (68.947 kg)  BMI 29.69 kg/m2  SpO2 100%  LMP 08/27/2012  Physical Exam  Nursing note and vitals reviewed. Constitutional: She is oriented to person, place, and time. She appears well-developed and well-nourished. No distress.  HENT:  Head: Normocephalic and atraumatic.  Mouth/Throat: Oropharynx is clear and moist.  Eyes: Conjunctivae and EOM are normal. Pupils are equal, round, and reactive to light.  Neck: Normal range of motion. Neck supple. No tracheal deviation present.  Cardiovascular: Normal rate, regular rhythm and normal heart sounds.   No murmur heard. Pulmonary/Chest: Effort normal and breath sounds normal. No respiratory distress. She has no wheezes. She has no rales.  Abdominal: Soft. Bowel sounds are normal. There is tenderness. There is no rebound and no guarding.  There is generalized tenderness to palpation with no rebound or guarding. Bowel sounds are normal  Musculoskeletal: Normal range of motion. She exhibits no edema.  Neurological: She is alert and oriented to person, place, and time. No cranial nerve deficit.  Skin: Skin is warm and dry.  Psychiatric: She has a normal mood and affect. Her behavior is normal.    ED Course  Procedures (including critical care time)  Medications  ondansetron (ZOFRAN) injection 4 mg (not administered)    DIAGNOSTIC STUDIES: Oxygen Saturation is 100% on room air, normal by my interpretation.    COORDINATION OF CARE: 10:58 PM-Advised pt that her symptoms are most likely gastroenteritis. Discussed treatment plan which includes medications, CBC panel, BMP, UA  with pt at bedside and pt agreed to plan.   11:40 PM- Pt is requesting narcotics for her symptoms. Advised pt that she will not be receiving narcotics for her symptoms.   Labs Review Labs Reviewed  URINALYSIS, ROUTINE W REFLEX MICROSCOPIC - Abnormal; Notable for the following:    Hgb urine dipstick TRACE (*)    Leukocytes, UA TRACE (*)  All other components within normal limits  URINE MICROSCOPIC-ADD ON - Abnormal; Notable for the following:    Bacteria, UA FEW (*)    All other components within normal limits  PREGNANCY, URINE  CBC WITH DIFFERENTIAL  BASIC METABOLIC PANEL   Imaging Review No results found.  MDM  No diagnosis found. This patient is a 27 year old female presents to the emergency department with diarrhea and abdominal cramping. Her presentation, exam, and workup are all consistent with a viral etiology. She is given medications and fluids however states she's not feeling much better. She's requesting additional pain medication. Upon reviewing this patient's chart, it has been noted she has had multiple visits for painful conditions and I do not feel as though treating gastroenteritis with narcotics as appropriate. She became unhappy with this and left angry. Had a similar encounter with this patient several weeks prior to this visit which ended the same way.  I personally performed the services described in this documentation, which was scribed in my presence. The recorded information has been reviewed and is accurate.     Geoffery Lyons, MD 10/10/12 1501

## 2012-10-02 NOTE — ED Notes (Signed)
Pt reports vomiting that started yesterday & diarrhea that started today. Complaining of abdominal pain.

## 2012-10-03 MED ORDER — PROMETHAZINE HCL 25 MG PO TABS: 25.0000 mg | ORAL_TABLET | Freq: Four times a day (QID) | ORAL | Status: DC | PRN

## 2012-10-03 NOTE — ED Provider Notes (Signed)
CSN: [id number]     Arrival date & time 10/02/12  1755 History   First MD Initiated Contact with Patient 10/02/12 2248     Chief Complaint  Patient presents with  . Emesis  . Diarrhea   (Consider location/radiation/quality/duration/timing/severity/associated sxs/prior Treatment) HPI  Past Medical History  Diagnosis Date  . Hypertension   . Migraine   . Asthma   . Pelvic inflammatory disease (PID)    Past Surgical History  Procedure Laterality Date  . Tubal ligation    . Kidney infections    . Wisdom tooth extraction     Family History  Problem Relation Age of Onset  . Hypertension Mother   . Diabetes Mother   . Heart failure Father    History  Substance Use Topics  . Smoking status: Light Tobacco Smoker    Types: Cigarettes  . Smokeless tobacco: Never Used     Comment: Occasional smoker  . Alcohol Use: Yes     Comment: occasionally-once a month   OB History   Grav Para Term Preterm Abortions TAB SAB Ect Mult Living   4 4 1 3      4      Review of Systems  Allergies  Tomato; Aspirin; Doxycycline; Hydromorphone hcl; Ibuprofen; Metoclopramide; Other; Shellfish allergy; Toradol; and Latex  Home Medications   Current Outpatient Rx  Name  Route  Sig  Dispense  Refill  . acetaminophen (TYLENOL) 500 MG tablet   Oral   Take 500 mg by mouth every 6 (six) hours as needed for pain.          Marland Kitchen albuterol (PROVENTIL HFA;VENTOLIN HFA) 108 (90 BASE) MCG/ACT inhaler   Inhalation   Inhale 2 puffs into the lungs every 6 (six) hours as needed for wheezing or shortness of breath.         Marland Kitchen amitriptyline (ELAVIL) 50 MG tablet   Oral   Take 50 mg by mouth at bedtime.         . butalbital-acetaminophen-caffeine (FIORICET) 50-325-40 MG per tablet   Oral   Take 1-2 tablets by mouth every 6 (six) hours as needed for headache.   20 tablet   0   . cyclobenzaprine (FLEXERIL) 10 MG tablet   Oral   Take 1 tablet (10 mg total) by mouth 3 (three) times daily as needed for  muscle spasms.   30 tablet   0   . diphenhydrAMINE (BENADRYL) 25 MG tablet   Oral   Take 25 mg by mouth at bedtime as needed for allergies.          Marland Kitchen HYDROcodone-acetaminophen (NORCO/VICODIN) 5-325 MG per tablet   Oral   Take 1-2 tablets by mouth every 6 (six) hours as needed for pain.   14 tablet   0   . loperamide (IMODIUM A-D) 2 MG tablet   Oral   Take 1 tablet (2 mg total) by mouth 4 (four) times daily as needed for diarrhea or loose stools.   30 tablet   0   . metaxalone (SKELAXIN) 800 MG tablet   Oral   Take 1 tablet (800 mg total) by mouth 3 (three) times daily.   60 tablet   2   . promethazine (PHENERGAN) 25 MG tablet   Oral   Take 1 tablet (25 mg total) by mouth every 6 (six) hours as needed for nausea.   15 tablet   2   . promethazine (PHENERGAN) 25 MG tablet   Oral   Take 1  tablet (25 mg total) by mouth every 6 (six) hours as needed for nausea.   12 tablet   0   . propranolol (INDERAL) 80 MG tablet   Oral   Take 80 mg by mouth 2 (two) times daily.          Marland Kitchen zolmitriptan (ZOMIG) 5 MG tablet   Oral   Take 5 mg by mouth as needed. Migraines, may take every 2 hours if needed, not exceeding 2 in a 24 hour period          BP 133/86  Pulse 73  Temp(Src) 98 F (36.7 C) (Oral)  Resp 20  Ht 5' (1.524 m)  Wt 152 lb (68.947 kg)  BMI 29.69 kg/m2  SpO2 100%  LMP 08/27/2012 Physical Exam  ED Course  Procedures (including critical care time) Labs Review Labs Reviewed  URINALYSIS, ROUTINE W REFLEX MICROSCOPIC - Abnormal; Notable for the following:    Hgb urine dipstick TRACE (*)    Leukocytes, UA TRACE (*)    All other components within normal limits  CBC WITH DIFFERENTIAL - Abnormal; Notable for the following:    Neutrophils Relative % 42 (*)    Lymphocytes Relative 47 (*)    Lymphs Abs 4.2 (*)    All other components within normal limits  URINE MICROSCOPIC-ADD ON - Abnormal; Notable for the following:    Bacteria, UA FEW (*)    All other  components within normal limits  PREGNANCY, URINE  BASIC METABOLIC PANEL   Imaging Review No results found.  MDM   1. Acute gastroenteritis    This patient is a 27 year old female who presents with complaints of vomiting, diarrhea, and abdominal pain. Her symptoms sound viral in nature and her laboratory studies are all unremarkable. Her abdomen is benign. She is requesting pain medication and has multiple allergies to everything except narcotics. I do not feel as though viral gastroenteritis indicates narcotic analgesia as a treatment. She is unhappy about this. I recently saw this patient for migraine headaches she became angry when I did not give her repeat doses of dilaudid. She will be discharged to home with followup with her primary care physician.    I personally performed the services described in this documentation, which was scribed in my presence. The recorded information has been reviewed and is accurate.    Geoffery Lyons, MD 10/03/12 [id number]

## 2012-10-03 NOTE — Telephone Encounter (Signed)
Spoke to patient. Having migraine intermittently. Scheduled appt w/ NP-Lynn.

## 2012-10-03 NOTE — ED Notes (Signed)
Pt refused to answer discharge questions & would not take discharge paperwork or prescriptions. Pt left department with stable gait & NAD noted.

## 2012-10-04 ENCOUNTER — Encounter (HOSPITAL_COMMUNITY): Payer: Self-pay | Admitting: Emergency Medicine

## 2012-10-04 ENCOUNTER — Encounter: Payer: Self-pay | Admitting: Nurse Practitioner

## 2012-10-04 ENCOUNTER — Emergency Department (HOSPITAL_COMMUNITY): Payer: Medicaid Other

## 2012-10-04 ENCOUNTER — Emergency Department (HOSPITAL_COMMUNITY)
Admission: EM | Admit: 2012-10-04 | Discharge: 2012-10-04 | Disposition: A | Discharge status: Home / Self Care | Payer: Medicaid Other | Attending: Emergency Medicine | Admitting: Emergency Medicine

## 2012-10-04 DIAGNOSIS — B9689 Other specified bacterial agents as the cause of diseases classified elsewhere: Secondary | ICD-10-CM | POA: Insufficient documentation

## 2012-10-04 DIAGNOSIS — N898 Other specified noninflammatory disorders of vagina: Secondary | ICD-10-CM | POA: Insufficient documentation

## 2012-10-04 DIAGNOSIS — I10 Essential (primary) hypertension: Secondary | ICD-10-CM | POA: Insufficient documentation

## 2012-10-04 DIAGNOSIS — R109 Unspecified abdominal pain: Secondary | ICD-10-CM | POA: Insufficient documentation

## 2012-10-04 DIAGNOSIS — F17200 Nicotine dependence, unspecified, uncomplicated: Secondary | ICD-10-CM | POA: Insufficient documentation

## 2012-10-04 DIAGNOSIS — A499 Bacterial infection, unspecified: Secondary | ICD-10-CM | POA: Insufficient documentation

## 2012-10-04 DIAGNOSIS — Z3202 Encounter for pregnancy test, result negative: Secondary | ICD-10-CM | POA: Insufficient documentation

## 2012-10-04 DIAGNOSIS — Z8742 Personal history of other diseases of the female genital tract: Secondary | ICD-10-CM | POA: Insufficient documentation

## 2012-10-04 DIAGNOSIS — G43909 Migraine, unspecified, not intractable, without status migrainosus: Secondary | ICD-10-CM | POA: Insufficient documentation

## 2012-10-04 DIAGNOSIS — Z9851 Tubal ligation status: Secondary | ICD-10-CM | POA: Insufficient documentation

## 2012-10-04 DIAGNOSIS — K921 Melena: Secondary | ICD-10-CM | POA: Insufficient documentation

## 2012-10-04 DIAGNOSIS — Z79899 Other long term (current) drug therapy: Secondary | ICD-10-CM | POA: Insufficient documentation

## 2012-10-04 DIAGNOSIS — J45909 Unspecified asthma, uncomplicated: Secondary | ICD-10-CM | POA: Insufficient documentation

## 2012-10-04 DIAGNOSIS — R1110 Vomiting, unspecified: Secondary | ICD-10-CM

## 2012-10-04 DIAGNOSIS — N83209 Unspecified ovarian cyst, unspecified side: Secondary | ICD-10-CM | POA: Insufficient documentation

## 2012-10-04 DIAGNOSIS — Z91040 Latex allergy status: Secondary | ICD-10-CM | POA: Insufficient documentation

## 2012-10-04 DIAGNOSIS — R112 Nausea with vomiting, unspecified: Secondary | ICD-10-CM | POA: Insufficient documentation

## 2012-10-04 DIAGNOSIS — R197 Diarrhea, unspecified: Secondary | ICD-10-CM | POA: Insufficient documentation

## 2012-10-04 DIAGNOSIS — N760 Acute vaginitis: Secondary | ICD-10-CM | POA: Insufficient documentation

## 2012-10-04 LAB — URINALYSIS, ROUTINE W REFLEX MICROSCOPIC
Bilirubin Urine: NEGATIVE
Nitrite: NEGATIVE
Specific Gravity, Urine: 1.025 (ref 1.005–1.030)
Urobilinogen, UA: 0.2 mg/dL (ref 0.0–1.0)

## 2012-10-04 LAB — CBC WITH DIFFERENTIAL/PLATELET
Basophils Absolute: 0 10*3/uL (ref 0.0–0.1)
Basophils Relative: 1 % (ref 0–1)
Eosinophils Absolute: 0.2 10*3/uL (ref 0.0–0.7)
Eosinophils Relative: 3 % (ref 0–5)
HCT: 36.8 % (ref 36.0–46.0)
Lymphocytes Relative: 41 % (ref 12–46)
MCHC: 35.1 g/dL (ref 30.0–36.0)
MCV: 80.7 fL (ref 78.0–100.0)
Monocytes Absolute: 0.6 10*3/uL (ref 0.1–1.0)
Platelets: 206 10*3/uL (ref 150–400)
RDW: 13.1 % (ref 11.5–15.5)
WBC: 6.3 10*3/uL (ref 4.0–10.5)

## 2012-10-04 LAB — WET PREP, GENITAL
Trich, Wet Prep: NONE SEEN
Yeast Wet Prep HPF POC: NONE SEEN

## 2012-10-04 LAB — COMPREHENSIVE METABOLIC PANEL
ALT: 6 U/L (ref 0–35)
AST: 12 U/L (ref 0–37)
Albumin: 4 g/dL (ref 3.5–5.2)
CO2: 23 mEq/L (ref 19–32)
Calcium: 10 mg/dL (ref 8.4–10.5)
Creatinine, Ser: 0.68 mg/dL (ref 0.50–1.10)
GFR calc non Af Amer: >90 mL/min (ref >90)
Sodium: 136 mEq/L (ref 135–145)
Total Protein: 7.3 g/dL (ref 6.0–8.3)

## 2012-10-04 LAB — URINE MICROSCOPIC-ADD ON

## 2012-10-04 LAB — POCT PREGNANCY, URINE: Preg Test, Ur: NEGATIVE

## 2012-10-04 MED ORDER — METRONIDAZOLE 500 MG PO TABS: 500.0000 mg | ORAL_TABLET | Freq: Two times a day (BID) | ORAL | Status: DC

## 2012-10-04 MED ORDER — AZITHROMYCIN 250 MG PO TABS
1000.0000 mg | ORAL_TABLET | Freq: Once | ORAL | Status: AC
  Administered 2012-10-04: 1000 mg via ORAL
  Filled 2012-10-04: qty 4

## 2012-10-04 MED ORDER — ACETAMINOPHEN 500 MG PO TABS
1000.0000 mg | ORAL_TABLET | Freq: Once | ORAL | Status: AC
  Administered 2012-10-04: 1000 mg via ORAL
  Filled 2012-10-04: qty 2

## 2012-10-04 MED ORDER — HYDROCODONE-ACETAMINOPHEN 5-325 MG PO TABS: 2.0000 | ORAL_TABLET | ORAL | Status: DC | PRN

## 2012-10-04 MED ORDER — SODIUM CHLORIDE 0.9 % IV BOLUS (SEPSIS)
1000.0000 mL | Freq: Once | INTRAVENOUS | Status: AC
  Administered 2012-10-04: 1000 mL via INTRAVENOUS

## 2012-10-04 MED ORDER — ONDANSETRON HCL 4 MG/2ML IJ SOLN
4.0000 mg | Freq: Once | INTRAMUSCULAR | Status: AC
  Administered 2012-10-04: 4 mg via INTRAVENOUS
  Filled 2012-10-04 (×2): qty 2

## 2012-10-04 MED ORDER — ONDANSETRON HCL 4 MG PO TABS: 4.0000 mg | ORAL_TABLET | Freq: Four times a day (QID) | ORAL | Status: DC

## 2012-10-04 MED ORDER — MORPHINE SULFATE 4 MG/ML IJ SOLN
4.0000 mg | Freq: Once | INTRAMUSCULAR | Status: AC
  Administered 2012-10-04: 4 mg via INTRAVENOUS
  Filled 2012-10-04 (×2): qty 1

## 2012-10-04 MED ORDER — PROMETHAZINE HCL 25 MG RE SUPP
25.0000 mg | Freq: Four times a day (QID) | RECTAL | Status: DC | PRN
  Administered 2012-10-04: 25 mg via RECTAL
  Filled 2012-10-04: qty 1

## 2012-10-04 MED ORDER — LIDOCAINE HCL (PF) 1 % IJ SOLN
INTRAMUSCULAR | Status: AC
  Administered 2012-10-04: 0.9 mL
  Filled 2012-10-04: qty 5

## 2012-10-04 MED ORDER — CEFTRIAXONE SODIUM 250 MG IJ SOLR
250.0000 mg | Freq: Once | INTRAMUSCULAR | Status: AC
  Administered 2012-10-04: 250 mg via INTRAMUSCULAR
  Filled 2012-10-04: qty 250

## 2012-10-04 MED ORDER — ONDANSETRON 4 MG PO TBDP
4.0000 mg | ORAL_TABLET | Freq: Once | ORAL | Status: AC
  Administered 2012-10-04: 4 mg via ORAL
  Filled 2012-10-04: qty 1

## 2012-10-04 NOTE — ED Notes (Signed)
Pt requesting to see Pt advocate. Pt advocate called.

## 2012-10-04 NOTE — ED Provider Notes (Signed)
CSN: [id number]     Arrival date & time 10/04/12  1045 History  This chart was scribed for Candyce Churn, MD by Quintella Reichert, ED scribe.  This patient was seen in room APA08/APA08 and the patient's care was started at 12:13 PM   Chief Complaint  Patient presents with  . Emesis  . Diarrhea  . Flank Pain    Patient is a 27 y.o. female presenting with abdominal pain. The history is provided by the patient. No language interpreter was used.  Abdominal Pain Pain location:  L flank Pain severity:  Severe Duration:  5 days Timing:  Constant Progression:  Worsening Chronicity:  New Relieved by:  Position changes Worsened by:  Nothing tried Ineffective treatments:  Acetaminophen Associated symptoms: diarrhea, melena, vaginal bleeding, vaginal discharge and vomiting   Risk factors comment:  H/o bacterial vaginosis   HPI Comments: Tanya Krause is a 27 y.o. female with h/o HTN and PID who presents to the Emergency Department complaining of severe abdominal pain that began 4-5 days ago and began radiating to her left flank today.  Pt describes pain as constant constant, severe, sharp, and throbbing.  Pain is relieved somewhat by holding her knees to her stomach.  She has attempted to treat pain with Tylenol, without relief.  For 4-5 days associated symptoms have included nausea, vomiting and diarrhea.  Pt was seen in the ED 2 days ago and diagnosed with viral gastroenteritis and given Phenergan.  She states that this has not relieved her symptoms.  She has not been able to tolerate food or liquid.  She had diarrhea 6 times throughout the night last night.  This morning pain spread to her left flank and she also noticed vaginal discharge and saw "a couple of drops" of blood in her urine.  She notes she is sexually active but not for the past 2 weeks.  She denies h/o STD.  She does have h/o bacterial vaginosis.   Past Medical History  Diagnosis Date  . Hypertension   . Migraine   .  Asthma   . Pelvic inflammatory disease (PID)     Past Surgical History  Procedure Laterality Date  . Tubal ligation    . Kidney infections    . Wisdom tooth extraction      Family History  Problem Relation Age of Onset  . Hypertension Mother   . Diabetes Mother   . Heart failure Father     History  Substance Use Topics  . Smoking status: Light Tobacco Smoker    Types: Cigarettes  . Smokeless tobacco: Never Used     Comment: Occasional smoker  . Alcohol Use: Yes     Comment: occasionally-once a month    OB History   Grav Para Term Preterm Abortions TAB SAB Ect Mult Living   4 4 1 3      4       Review of Systems  Gastrointestinal: Positive for vomiting, abdominal pain, diarrhea and melena.  Genitourinary: Positive for flank pain, vaginal bleeding and vaginal discharge.  All other systems reviewed and are negative.    Allergies  Tomato; Aspirin; Doxycycline; Hydromorphone hcl; Ibuprofen; Metoclopramide; Other; Shellfish allergy; Toradol; and Latex  Home Medications   Current Outpatient Rx  Name  Route  Sig  Dispense  Refill  . acetaminophen (TYLENOL) 500 MG tablet   Oral   Take 500 mg by mouth every 6 (six) hours as needed for pain.          Marland Kitchen  amitriptyline (ELAVIL) 50 MG tablet   Oral   Take 50 mg by mouth at bedtime.         . butalbital-acetaminophen-caffeine (FIORICET) 50-325-40 MG per tablet   Oral   Take 1-2 tablets by mouth every 6 (six) hours as needed for headache.   20 tablet   0   . cyclobenzaprine (FLEXERIL) 10 MG tablet   Oral   Take 1 tablet (10 mg total) by mouth 3 (three) times daily as needed for muscle spasms.   30 tablet   0   . diphenhydrAMINE (BENADRYL) 25 MG tablet   Oral   Take 25 mg by mouth at bedtime as needed for allergies.          . promethazine (PHENERGAN) 25 MG tablet   Oral   Take 1 tablet (25 mg total) by mouth every 6 (six) hours as needed for nausea.   12 tablet   0   . propranolol (INDERAL) 80 MG  tablet   Oral   Take 80 mg by mouth 2 (two) times daily.          Marland Kitchen zolmitriptan (ZOMIG) 5 MG tablet   Oral   Take 5 mg by mouth as needed. Migraines, may take every 2 hours if needed, not exceeding 2 in a 24 hour period         . albuterol (PROVENTIL HFA;VENTOLIN HFA) 108 (90 BASE) MCG/ACT inhaler   Inhalation   Inhale 2 puffs into the lungs every 6 (six) hours as needed for wheezing or shortness of breath.          BP 120/74  Pulse 97  Temp(Src) 98.1 F (36.7 C) (Oral)  Resp 16  Ht 5' (1.524 m)  Wt 152 lb (68.947 kg)  BMI 29.69 kg/m2  SpO2 100%  LMP 08/27/2012  Physical Exam  Nursing note and vitals reviewed. Constitutional: She is oriented to person, place, and time. She appears well-developed and well-nourished. No distress.  HENT:  Head: Normocephalic and atraumatic.  Mouth/Throat: Oropharynx is clear and moist.  Eyes: Conjunctivae are normal. Pupils are equal, round, and reactive to light. No scleral icterus.  Neck: Neck supple.  Cardiovascular: Normal rate, regular rhythm, normal heart sounds and intact distal pulses.   No murmur heard. Pulmonary/Chest: Effort normal and breath sounds normal. No stridor. No respiratory distress. She has no rales.  Abdominal: Soft. Bowel sounds are normal. She exhibits no distension. There is tenderness. There is no rigidity, no rebound and no guarding.  LUQ and LLQ tenderness to palpation  Genitourinary: Uterus is not tender. Cervix exhibits no motion tenderness and no friability. Right adnexum displays no mass, no tenderness and no fullness. Left adnexum displays tenderness. Left adnexum displays no mass and no fullness.  Musculoskeletal: Normal range of motion.  Neurological: She is alert and oriented to person, place, and time.  Skin: Skin is warm and dry. No rash noted.  Psychiatric: She has a normal mood and affect. Her behavior is normal.    ED Course  Procedures (including critical care time)  DIAGNOSTIC  STUDIES: Oxygen Saturation is 100% on room air, normal by my interpretation.    COORDINATION OF CARE: 12:21 PM-Discussed treatment plan which includes anti-emetics, pelvic exam and labs with pt at bedside and pt agreed to plan.    Labs Review Labs Reviewed  WET PREP, GENITAL - Abnormal; Notable for the following:    Clue Cells Wet Prep HPF POC MANY (*)    WBC, Wet Prep HPF  POC MODERATE (*)    All other components within normal limits  URINALYSIS, ROUTINE W REFLEX MICROSCOPIC - Abnormal; Notable for the following:    Hgb urine dipstick TRACE (*)    Ketones, ur TRACE (*)    All other components within normal limits  GC/CHLAMYDIA PROBE AMP  CBC WITH DIFFERENTIAL  COMPREHENSIVE METABOLIC PANEL  URINE MICROSCOPIC-ADD ON  POCT PREGNANCY, URINE    Imaging Review No results found.  MDM   1. Left flank pain   2. Vomiting and diarrhea   3. Bacterial vaginosis    27 yo female with several days of left flank pain.  Labwork shows mild hematuria.  CT pending at time care transferred to Dr. Manus Gunning.      I personally performed the services described in this documentation, which was scribed in my presence. The recorded information has been reviewed and is accurate.     Candyce Churn, MD 10/09/12 [phone]

## 2012-10-04 NOTE — ED Provider Notes (Signed)
Care assumed from Dr. York Spaniel. Patient presents with nausea, vomiting diarrhea for the past 3 days with flank pain. Workup was remarkable for bacterial vaginosis. Negative UA. CT pending at time of signout. CT shows no obstructive kidney stone. Ultrasound shows left-sided ovarian follicle. No evidence of torsion or TOA. Treat symptomatically.  Will treat bacterial vaginosis.  BP 121/88  Pulse 95  Temp(Src) 98.6 F (37 C) (Oral)  Resp 18  Ht 5' (1.524 m)  Wt 152 lb (68.947 kg)  BMI 29.69 kg/m2  SpO2 100%  LMP 08/27/2012   Glynn Octave, MD 10/04/12 4401

## 2012-10-04 NOTE — ED Notes (Addendum)
Pt reports "i have a complaint out on two of the doctors here today and I didn't think I could see them if I had a complaint out." Pt reports," but if that's all I have to see to today, I will see them." Doctors that pt reports she has complaints out on have never seen here per chart review in [facility]. Charge RN aware of situation and is at pt bedside.

## 2012-10-04 NOTE — ED Notes (Signed)
Pt c/o n/v/d since last Tues. Pt reports blood in urine, onset this am, and L flank pain.

## 2012-10-05 LAB — GC/CHLAMYDIA PROBE AMP: GC Probe RNA: NEGATIVE

## 2012-10-11 ENCOUNTER — Ambulatory Visit: Payer: Self-pay | Admitting: Nurse Practitioner

## 2012-10-27 ENCOUNTER — Encounter (HOSPITAL_COMMUNITY): Payer: Self-pay | Admitting: Emergency Medicine

## 2012-10-27 ENCOUNTER — Emergency Department (HOSPITAL_COMMUNITY)
Admission: EM | Admit: 2012-10-27 | Discharge: 2012-10-28 | Disposition: A | Discharge status: Home / Self Care | Payer: Medicaid Other | Attending: Emergency Medicine | Admitting: Emergency Medicine

## 2012-10-27 DIAGNOSIS — B9789 Other viral agents as the cause of diseases classified elsewhere: Secondary | ICD-10-CM | POA: Insufficient documentation

## 2012-10-27 DIAGNOSIS — Z79899 Other long term (current) drug therapy: Secondary | ICD-10-CM | POA: Insufficient documentation

## 2012-10-27 DIAGNOSIS — J069 Acute upper respiratory infection, unspecified: Secondary | ICD-10-CM

## 2012-10-27 DIAGNOSIS — F17200 Nicotine dependence, unspecified, uncomplicated: Secondary | ICD-10-CM | POA: Insufficient documentation

## 2012-10-27 DIAGNOSIS — Z8742 Personal history of other diseases of the female genital tract: Secondary | ICD-10-CM | POA: Insufficient documentation

## 2012-10-27 DIAGNOSIS — J45901 Unspecified asthma with (acute) exacerbation: Secondary | ICD-10-CM | POA: Insufficient documentation

## 2012-10-27 DIAGNOSIS — G43909 Migraine, unspecified, not intractable, without status migrainosus: Secondary | ICD-10-CM | POA: Insufficient documentation

## 2012-10-27 DIAGNOSIS — R197 Diarrhea, unspecified: Secondary | ICD-10-CM | POA: Insufficient documentation

## 2012-10-27 DIAGNOSIS — Z91040 Latex allergy status: Secondary | ICD-10-CM | POA: Insufficient documentation

## 2012-10-27 DIAGNOSIS — Z3202 Encounter for pregnancy test, result negative: Secondary | ICD-10-CM | POA: Insufficient documentation

## 2012-10-27 DIAGNOSIS — I10 Essential (primary) hypertension: Secondary | ICD-10-CM | POA: Insufficient documentation

## 2012-10-27 MED ORDER — SODIUM CHLORIDE 0.9 % IV BOLUS (SEPSIS)
1000.0000 mL | Freq: Once | INTRAVENOUS | Status: AC
  Administered 2012-10-28: 1000 mL via INTRAVENOUS

## 2012-10-27 MED ORDER — ALBUTEROL SULFATE HFA 108 (90 BASE) MCG/ACT IN AERS: 1.0000 | INHALATION_SPRAY | Freq: Four times a day (QID) | RESPIRATORY_TRACT | Status: DC | PRN

## 2012-10-27 MED ORDER — ALBUTEROL SULFATE (5 MG/ML) 0.5% IN NEBU
5.0000 mg | INHALATION_SOLUTION | Freq: Once | RESPIRATORY_TRACT | Status: AC
  Administered 2012-10-27: 5 mg via RESPIRATORY_TRACT
  Filled 2012-10-27: qty 1

## 2012-10-27 MED ORDER — BENZONATATE 100 MG PO CAPS: 100.0000 mg | ORAL_CAPSULE | Freq: Three times a day (TID) | ORAL | Status: DC

## 2012-10-27 MED ORDER — ACETAMINOPHEN 325 MG PO TABS
650.0000 mg | ORAL_TABLET | Freq: Once | ORAL | Status: AC
  Administered 2012-10-28: 650 mg via ORAL
  Filled 2012-10-27: qty 2

## 2012-10-27 MED ORDER — PREDNISONE 20 MG PO TABS: 60.0000 mg | ORAL_TABLET | Freq: Every day | ORAL | Status: DC

## 2012-10-27 MED ORDER — METHYLPREDNISOLONE SODIUM SUCC 125 MG IJ SOLR
125.0000 mg | Freq: Once | INTRAMUSCULAR | Status: AC
  Administered 2012-10-28: 125 mg via INTRAVENOUS
  Filled 2012-10-27: qty 2

## 2012-10-27 MED ORDER — ONDANSETRON HCL 4 MG/2ML IJ SOLN
4.0000 mg | Freq: Once | INTRAMUSCULAR | Status: AC
  Administered 2012-10-28: 4 mg via INTRAVENOUS
  Filled 2012-10-27: qty 2

## 2012-10-27 MED ORDER — ONDANSETRON HCL 4 MG PO TABS: 4.0000 mg | ORAL_TABLET | Freq: Four times a day (QID) | ORAL | Status: DC

## 2012-10-27 MED ORDER — ALBUTEROL SULFATE HFA 108 (90 BASE) MCG/ACT IN AERS
2.0000 | INHALATION_SPRAY | RESPIRATORY_TRACT | Status: DC | PRN
  Administered 2012-10-28: 2 via RESPIRATORY_TRACT
  Filled 2012-10-27: qty 6.7

## 2012-10-27 MED ORDER — OXYMETAZOLINE HCL 0.05 % NA SOLN: 2.0000 | Freq: Two times a day (BID) | NASAL | Status: DC

## 2012-10-27 MED ORDER — OXYMETAZOLINE HCL 0.05 % NA SOLN
1.0000 | Freq: Once | NASAL | Status: AC
  Administered 2012-10-28: 1 via NASAL
  Filled 2012-10-27: qty 15

## 2012-10-27 NOTE — ED Provider Notes (Signed)
CSN: [id number]     Arrival date & time 10/27/12  2056 History   None  Scribed for Sunnie Nielsen, MD, the patient was seen in room APA03/APA03. This chart was scribed by Lewanda Rife, ED scribe. Patient's care was started at 10:57 PM   Chief Complaint  Patient presents with  . Nausea  . Emesis  . Diarrhea   (Consider location/radiation/quality/duration/timing/severity/associated sxs/prior Treatment) The history is provided by the patient. No language interpreter was used.   HPI Comments: Tanya Krause is a 27 y.o. female who presents to the Emergency Department complaining worsening nausea onset earlier today. Reports associated generalized myalgias, emesis, diarrhea (x 5 episodes), cough, nasal congestion, back pain, fever, and abdominal cramping. Denies any aggravating factors. Reports taking tylenol 5 pm with moderate relief of symptoms. Denies associated bloody stools, and shortness of breath. Denies sick contacts.   Past Medical History  Diagnosis Date  . Hypertension   . Migraine   . Asthma   . Pelvic inflammatory disease (PID)    Past Surgical History  Procedure Laterality Date  . Tubal ligation    . Kidney infections    . Wisdom tooth extraction     Family History  Problem Relation Age of Onset  . Hypertension Mother   . Diabetes Mother   . Heart failure Father    History  Substance Use Topics  . Smoking status: Light Tobacco Smoker    Types: Cigarettes  . Smokeless tobacco: Never Used     Comment: Occasional smoker  . Alcohol Use: Yes     Comment: occasionally-once a month   OB History   Grav Para Term Preterm Abortions TAB SAB Ect Mult Living   4 4 1 3      4      Review of Systems  Gastrointestinal: Positive for nausea, vomiting and diarrhea.  All other systems reviewed and are negative.  A complete 10 system review of systems was obtained and all systems are negative except as noted in the HPI and PMHx.     Allergies  Tomato; Aspirin;  Doxycycline; Hydromorphone hcl; Ibuprofen; Metoclopramide; Other; Shellfish allergy; Toradol; and Latex  Home Medications   Current Outpatient Rx  Name  Route  Sig  Dispense  Refill  . acetaminophen (TYLENOL) 500 MG tablet   Oral   Take 500 mg by mouth every 6 (six) hours as needed for pain.          Marland Kitchen albuterol (PROVENTIL HFA;VENTOLIN HFA) 108 (90 BASE) MCG/ACT inhaler   Inhalation   Inhale 2 puffs into the lungs every 6 (six) hours as needed for wheezing or shortness of breath.         Marland Kitchen amitriptyline (ELAVIL) 50 MG tablet   Oral   Take 50 mg by mouth at bedtime.         . butalbital-acetaminophen-caffeine (FIORICET) 50-325-40 MG per tablet   Oral   Take 1-2 tablets by mouth every 6 (six) hours as needed for headache.   20 tablet   0   . diphenhydrAMINE (BENADRYL) 25 MG tablet   Oral   Take 25 mg by mouth at bedtime as needed for allergies.          Marland Kitchen ondansetron (ZOFRAN) 4 MG tablet   Oral   Take 1 tablet (4 mg total) by mouth every 6 (six) hours.   12 tablet   0   . promethazine (PHENERGAN) 6.25 MG/5ML syrup   Oral   Take 12.5 mg by mouth  4 (four) times daily as needed for nausea.         . propranolol (INDERAL) 80 MG tablet   Oral   Take 80 mg by mouth 2 (two) times daily.          Marland Kitchen zolmitriptan (ZOMIG) 5 MG tablet   Oral   Take 5 mg by mouth as needed. Migraines, may take every 2 hours if needed, not exceeding 2 in a 24 hour period          BP 138/95  Pulse 96  Temp(Src) 98.1 F (36.7 C) (Oral)  Resp 16  Ht 5' (1.524 m)  Wt 154 lb (69.854 kg)  BMI 30.08 kg/m2  SpO2 97%  LMP 10/14/2012 Physical Exam  Nursing note and vitals reviewed. Constitutional: She is oriented to person, place, and time. She appears well-developed and well-nourished. No distress.  HENT:  Head: Normocephalic and atraumatic.  Nasal congestion   Eyes: Conjunctivae and EOM are normal. No scleral icterus.  Neck: Neck supple. No tracheal deviation present.   Cardiovascular: Normal rate and regular rhythm.   No murmur heard. Pulmonary/Chest: Effort normal. No respiratory distress. She has wheezes. She has no rales.  Expiratory wheezes   Abdominal: Soft. She exhibits no distension. There is no tenderness. There is no rebound and no guarding.  Musculoskeletal: Normal range of motion.  Neurological: She is alert and oriented to person, place, and time.  Skin: Skin is warm and dry.  Psychiatric: She has a normal mood and affect. Her behavior is normal.    ED Course  Procedures (including critical care time) COORDINATION OF CARE:  Nursing notes reviewed. Vital signs reviewed. Initial pt interview and examination performed.   11:00 PM-Discussed work up plan with pt at bedside, which includes UA and pregnancy urine. Pt agrees with plan.   Treatment plan initiated: Medications  sodium chloride 0.9 % bolus 1,000 mL (not administered)  ondansetron (ZOFRAN) injection 4 mg (not administered)  methylPREDNISolone sodium succinate (SOLU-MEDROL) 125 mg/2 mL injection 125 mg (not administered)  oxymetazoline (AFRIN) 0.05 % nasal spray 1 spray (not administered)  acetaminophen (TYLENOL) tablet 650 mg (not administered)  albuterol (PROVENTIL) (5 MG/ML) 0.5% nebulizer solution 5 mg (5 mg Nebulization Given 10/27/12 2323)     Initial diagnostic testing ordered.    Labs Review Results for orders placed during the hospital encounter of 10/27/12  POCT PREGNANCY, URINE      Result Value Range   Preg Test, Ur NEGATIVE  NEGATIVE    pulse ox 97% room air is adequate  Improved with IV fluids, Zofran and pain medications  Wheezes resolved with albuterol treatment and steroids  Plan discharge home with close outpatient followup. Prescriptions provided. Return precautions verbalized as understood.   MDM  Diagnoses: URI with wheezing/ viral infection with nausea vomiting diarrhea  Medications provided and condition improved.  I personally performed  the services described in this documentation, which was scribed in my presence. The recorded information has been reviewed and is accurate.     Sunnie Nielsen, MD 10/28/12 [phone]

## 2012-10-27 NOTE — ED Notes (Signed)
Pt c/o n/v/d, fever, and body aches x 1 day.

## 2012-10-28 LAB — URINALYSIS, ROUTINE W REFLEX MICROSCOPIC
Bilirubin Urine: NEGATIVE
Glucose, UA: NEGATIVE mg/dL
Hgb urine dipstick: NEGATIVE
Leukocytes, UA: NEGATIVE
Nitrite: NEGATIVE
Protein, ur: NEGATIVE mg/dL
pH: >9 — ABNORMAL HIGH (ref 5.0–8.0)

## 2012-10-28 LAB — PREGNANCY, URINE: Preg Test, Ur: NEGATIVE

## 2012-10-28 MED ORDER — HYDROMORPHONE HCL PF 1 MG/ML IJ SOLN
1.0000 mg | Freq: Once | INTRAMUSCULAR | Status: AC
  Administered 2012-10-28: 1 mg via INTRAVENOUS
  Filled 2012-10-28: qty 1

## 2012-10-28 MED ORDER — PROMETHAZINE HCL 25 MG/ML IJ SOLN
12.5000 mg | Freq: Once | INTRAMUSCULAR | Status: AC
  Administered 2012-10-28: 12.5 mg via INTRAVENOUS

## 2012-10-28 MED ORDER — DIPHENHYDRAMINE HCL 50 MG/ML IJ SOLN
25.0000 mg | Freq: Once | INTRAMUSCULAR | Status: AC
  Administered 2012-10-28: 25 mg via INTRAVENOUS
  Filled 2012-10-28: qty 1

## 2012-10-28 MED ORDER — PROMETHAZINE HCL 25 MG/ML IJ SOLN
INTRAMUSCULAR | Status: AC
  Administered 2012-10-28: 12.5 mg via INTRAVENOUS
  Filled 2012-10-28: qty 1

## 2012-11-16 NOTE — Telephone Encounter (Signed)
Routed to [facility]

## 2012-12-20 ENCOUNTER — Encounter (HOSPITAL_COMMUNITY): Payer: Self-pay | Admitting: Emergency Medicine

## 2012-12-20 ENCOUNTER — Emergency Department (HOSPITAL_COMMUNITY)
Admission: EM | Admit: 2012-12-20 | Discharge: 2012-12-20 | Disposition: A | Discharge status: Home / Self Care | Payer: Medicaid Other | Attending: Emergency Medicine | Admitting: Emergency Medicine

## 2012-12-20 DIAGNOSIS — Z91040 Latex allergy status: Secondary | ICD-10-CM | POA: Insufficient documentation

## 2012-12-20 DIAGNOSIS — N390 Urinary tract infection, site not specified: Secondary | ICD-10-CM | POA: Insufficient documentation

## 2012-12-20 DIAGNOSIS — J45909 Unspecified asthma, uncomplicated: Secondary | ICD-10-CM | POA: Insufficient documentation

## 2012-12-20 DIAGNOSIS — F17200 Nicotine dependence, unspecified, uncomplicated: Secondary | ICD-10-CM | POA: Insufficient documentation

## 2012-12-20 DIAGNOSIS — I10 Essential (primary) hypertension: Secondary | ICD-10-CM | POA: Insufficient documentation

## 2012-12-20 DIAGNOSIS — G43909 Migraine, unspecified, not intractable, without status migrainosus: Secondary | ICD-10-CM | POA: Insufficient documentation

## 2012-12-20 DIAGNOSIS — IMO0002 Reserved for concepts with insufficient information to code with codable children: Secondary | ICD-10-CM | POA: Insufficient documentation

## 2012-12-20 DIAGNOSIS — Z79899 Other long term (current) drug therapy: Secondary | ICD-10-CM | POA: Insufficient documentation

## 2012-12-20 DIAGNOSIS — Z3202 Encounter for pregnancy test, result negative: Secondary | ICD-10-CM | POA: Insufficient documentation

## 2012-12-20 DIAGNOSIS — R112 Nausea with vomiting, unspecified: Secondary | ICD-10-CM | POA: Insufficient documentation

## 2012-12-20 LAB — URINE MICROSCOPIC-ADD ON

## 2012-12-20 LAB — BASIC METABOLIC PANEL
CO2: 26 mEq/L (ref 19–32)
Calcium: 10.1 mg/dL (ref 8.4–10.5)
Chloride: 100 mEq/L (ref 96–112)
GFR calc Af Amer: >90 mL/min (ref >90)
Potassium: 3.7 mEq/L (ref 3.5–5.1)
Sodium: 138 mEq/L (ref 135–145)

## 2012-12-20 LAB — URINALYSIS, ROUTINE W REFLEX MICROSCOPIC
Bilirubin Urine: NEGATIVE
Nitrite: NEGATIVE
Protein, ur: NEGATIVE mg/dL
Specific Gravity, Urine: 1.015 (ref 1.005–1.030)
Urobilinogen, UA: 0.2 mg/dL (ref 0.0–1.0)
pH: 6 (ref 5.0–8.0)

## 2012-12-20 LAB — CBC WITH DIFFERENTIAL/PLATELET
Basophils Absolute: 0 10*3/uL (ref 0.0–0.1)
Basophils Relative: 0 % (ref 0–1)
Eosinophils Relative: 1 % (ref 0–5)
Lymphocytes Relative: 38 % (ref 12–46)
Lymphs Abs: 3.7 10*3/uL (ref 0.7–4.0)
Neutro Abs: 5.2 10*3/uL (ref 1.7–7.7)
Neutrophils Relative %: 54 % (ref 43–77)
Platelets: 234 10*3/uL (ref 150–400)
RBC: 4.85 MIL/uL (ref 3.87–5.11)
RDW: 13.6 % (ref 11.5–15.5)
WBC: 9.8 10*3/uL (ref 4.0–10.5)

## 2012-12-20 MED ORDER — ONDANSETRON HCL 4 MG/2ML IJ SOLN
4.0000 mg | Freq: Once | INTRAMUSCULAR | Status: AC
  Administered 2012-12-20: 4 mg via INTRAVENOUS
  Filled 2012-12-20: qty 2

## 2012-12-20 MED ORDER — MORPHINE SULFATE 4 MG/ML IJ SOLN
4.0000 mg | Freq: Once | INTRAMUSCULAR | Status: AC
  Administered 2012-12-20: 4 mg via INTRAVENOUS
  Filled 2012-12-20: qty 1

## 2012-12-20 MED ORDER — OXYCODONE-ACETAMINOPHEN 5-325 MG PO TABS
1.0000 | ORAL_TABLET | Freq: Once | ORAL | Status: AC
  Administered 2012-12-20: 1 via ORAL
  Filled 2012-12-20: qty 1

## 2012-12-20 MED ORDER — DEXTROSE 5 % IV SOLN
1.0000 g | Freq: Once | INTRAVENOUS | Status: AC
  Administered 2012-12-20: 1 g via INTRAVENOUS
  Filled 2012-12-20: qty 10

## 2012-12-20 MED ORDER — SODIUM CHLORIDE 0.9 % IV BOLUS (SEPSIS)
1000.0000 mL | Freq: Once | INTRAVENOUS | Status: AC
  Administered 2012-12-20: 1000 mL via INTRAVENOUS

## 2012-12-20 NOTE — ED Notes (Signed)
Rt low back and low abd pain, Urine has foul odor, vomiting, for 2.5 weeks.

## 2012-12-20 NOTE — ED Provider Notes (Signed)
CSN: [id number]     Arrival date & time 12/20/12  1811 History   This chart was scribed for Tanya Lennert, MD by Bennett Scrape, ED Scribe. This patient was seen in room APA02/APA02 and the patient's care was started at 9:25 PM.    Chief Complaint  Patient presents with  . Abdominal Pain    Patient is a 27 y.o. female presenting with abdominal pain. The history is provided by the patient. No language interpreter was used.  Abdominal Pain Pain location:  Suprapubic Pain radiates to:  L flank Onset quality:  Gradual Duration: 2.5 weeks. Timing:  Constant Chronicity:  Recurrent Associated symptoms: dysuria, nausea and vomiting   Associated symptoms: no chest pain, no cough, no diarrhea, no fatigue and no hematuria     HPI Comments: Tanya Krause is a 27 y.o. female who presents to the Emergency Department complaining of lower abdominal pain that radiates to the left back pain that has been worsening for the past 2.5 weeks. She admits to associated dysuria and malodorous urine. She also reports 4 episodes of emesis today but denies any diarrhea. She expresses concern that she has been unable to tolerate liquids since the onset. She reports prior UTIs and kidney infections but denies having had any kidney stones.    Past Medical History  Diagnosis Date  . Hypertension   . Migraine   . Asthma   . Pelvic inflammatory disease (PID)    Past Surgical History  Procedure Laterality Date  . Tubal ligation    . Kidney infections    . Wisdom tooth extraction     Family History  Problem Relation Age of Onset  . Hypertension Mother   . Diabetes Mother   . Heart failure Father    History  Substance Use Topics  . Smoking status: Light Tobacco Smoker    Types: Cigarettes  . Smokeless tobacco: Never Used     Comment: Occasional smoker  . Alcohol Use: Yes     Comment: occasionally-once a month   OB History   Grav Para Term Preterm Abortions TAB SAB Ect Mult Living   4 4 1 3       4      Review of Systems  Constitutional: Negative for appetite change and fatigue.  HENT: Negative for congestion, ear discharge and sinus pressure.   Eyes: Negative for discharge.  Respiratory: Negative for cough.   Cardiovascular: Negative for chest pain.  Gastrointestinal: Positive for nausea and vomiting. Negative for abdominal pain and diarrhea.  Genitourinary: Positive for dysuria. Negative for frequency and hematuria.       + for malodorous urine    Musculoskeletal: Negative for back pain.  Skin: Negative for rash.  Neurological: Negative for seizures and headaches.  Psychiatric/Behavioral: Negative for hallucinations.    Allergies  Tomato; Aspirin; Doxycycline; Hydromorphone hcl; Ibuprofen; Metoclopramide; Other; Shellfish allergy; Toradol; and Latex  Home Medications   Current Outpatient Rx  Name  Route  Sig  Dispense  Refill  . acetaminophen (TYLENOL) 500 MG tablet   Oral   Take 500 mg by mouth every 6 (six) hours as needed for pain.          Marland Kitchen albuterol (PROVENTIL HFA;VENTOLIN HFA) 108 (90 BASE) MCG/ACT inhaler   Inhalation   Inhale 2 puffs into the lungs every 6 (six) hours as needed for wheezing or shortness of breath.         Marland Kitchen albuterol (PROVENTIL HFA;VENTOLIN HFA) 108 (90 BASE) MCG/ACT inhaler  Inhalation   Inhale 1-2 puffs into the lungs every 6 (six) hours as needed for wheezing.   1 Inhaler   0   . amitriptyline (ELAVIL) 50 MG tablet   Oral   Take 50 mg by mouth at bedtime.         . benzonatate (TESSALON) 100 MG capsule   Oral   Take 1 capsule (100 mg total) by mouth every 8 (eight) hours.   21 capsule   0   . butalbital-acetaminophen-caffeine (FIORICET) 50-325-40 MG per tablet   Oral   Take 1-2 tablets by mouth every 6 (six) hours as needed for headache.   20 tablet   0   . diphenhydrAMINE (BENADRYL) 25 MG tablet   Oral   Take 25 mg by mouth at bedtime as needed for allergies.          Marland Kitchen ondansetron (ZOFRAN) 4 MG tablet    Oral   Take 1 tablet (4 mg total) by mouth every 6 (six) hours.   12 tablet   0   . ondansetron (ZOFRAN) 4 MG tablet   Oral   Take 1 tablet (4 mg total) by mouth every 6 (six) hours.   12 tablet   0   . oxymetazoline (AFRIN NASAL SPRAY) 0.05 % nasal spray   Nasal   Place 2 sprays into the nose 2 (two) times daily.   30 mL   0   . predniSONE (DELTASONE) 20 MG tablet   Oral   Take 3 tablets (60 mg total) by mouth daily.   15 tablet   0   . promethazine (PHENERGAN) 6.25 MG/5ML syrup   Oral   Take 12.5 mg by mouth 4 (four) times daily as needed for nausea.         . propranolol (INDERAL) 80 MG tablet   Oral   Take 80 mg by mouth 2 (two) times daily.          Marland Kitchen zolmitriptan (ZOMIG) 5 MG tablet   Oral   Take 5 mg by mouth as needed. Migraines, may take every 2 hours if needed, not exceeding 2 in a 24 hour period          Triage Vitals: BP 130/92  Pulse 91  Temp(Src) 98.1 F (36.7 C) (Oral)  Resp 20  Ht 5' (1.524 m)  Wt 149 lb (67.586 kg)  BMI 29.10 kg/m2  SpO2 100%  LMP 12/03/2012  Physical Exam  Nursing note and vitals reviewed. Constitutional: She is oriented to person, place, and time. She appears well-developed and well-nourished.  HENT:  Head: Normocephalic and atraumatic.  Eyes: Conjunctivae and EOM are normal. No scleral icterus.  Neck: Neck supple. No thyromegaly present.  Cardiovascular: Normal rate and regular rhythm.  Exam reveals no gallop and no friction rub.   No murmur heard. Pulmonary/Chest: Effort normal and breath sounds normal. No stridor. She has no wheezes. She has no rales. She exhibits no tenderness.  Abdominal: Soft. She exhibits no distension. There is tenderness (moderate suprapubic). There is no rebound.  Musculoskeletal: Normal range of motion. She exhibits no edema.  Mild L flank tenderness   Lymphadenopathy:    She has no cervical adenopathy.  Neurological: She is alert and oriented to person, place, and time. She exhibits  normal muscle tone. Coordination normal.  Skin: Skin is warm and dry. No rash noted. No erythema.  Psychiatric: She has a normal mood and affect. Her behavior is normal.    ED Course  Procedures (including critical care time)  Medications  sodium chloride 0.9 % bolus 1,000 mL (not administered)  morphine 4 MG/ML injection 4 mg (not administered)  ondansetron (ZOFRAN) injection 4 mg (not administered)  cefTRIAXone (ROCEPHIN) 1 g in dextrose 5 % 50 mL IVPB (not administered)    DIAGNOSTIC STUDIES: Oxygen Saturation is 100% on room air, normal by my interpretation.    COORDINATION OF CARE: 9:30 PM-Discussed treatment plan which includes IV fluids, CBC, BMP and UA with pt at bedside and pt agreed to plan.   Labs Review Labs Reviewed  URINALYSIS, ROUTINE W REFLEX MICROSCOPIC - Abnormal; Notable for the following:    APPearance HAZY (*)    Hgb urine dipstick MODERATE (*)    Leukocytes, UA SMALL (*)    All other components within normal limits  URINE MICROSCOPIC-ADD ON - Abnormal; Notable for the following:    Squamous Epithelial / LPF FEW (*)    Bacteria, UA MANY (*)    All other components within normal limits  URINE CULTURE  CBC WITH DIFFERENTIAL  BASIC METABOLIC PANEL   Imaging Review No results found.  EKG Interpretation   None       MDM  Uti,  Follow up in one week. tx with keflex   Tanya Lennert, MD 12/21/12 1046

## 2012-12-21 LAB — POCT PREGNANCY, URINE: Preg Test, Ur: NEGATIVE

## 2012-12-23 LAB — URINE CULTURE

## 2012-12-25 ENCOUNTER — Telehealth (HOSPITAL_COMMUNITY): Payer: Self-pay | Admitting: *Deleted

## 2012-12-25 NOTE — ED Notes (Signed)
Post ED Visit - Positive Culture Follow-up: Successful Patient Follow-Up  Culture assessed and recommendations reviewed by: []  Wes Dulaney, Pharm.D., BCPS [x]  Celedonio Miyamoto, Pharm.D., BCPS []  Georgina Pillion, Pharm.D., BCPS []  [place], [place].D., BCPS, AAHIVP []  Estella Husk, Pharm.D., BCPS, AAHIVP  Positive urine culture  []  Patient discharged without antimicrobial prescription and treatment is now indicated [x]  Organism is resistant to prescribed ED discharge antimicrobial []  Patient with positive blood cultures  Changes discussed with ED provider:Bowie Laveda Norman New antibiotic prescription- Make sure Cephalexin rx -If not Keflex 500 mg po TID x 7 days per Fayrene Helper  Contacted patient, line busy   Larena Sox 12/25/2012, 5:43 PM

## 2012-12-28 NOTE — ED Notes (Signed)
Unable to contact patient via phone. Sent letter. °

## 2013-11-18 ENCOUNTER — Encounter (HOSPITAL_COMMUNITY): Payer: Self-pay | Admitting: Emergency Medicine

## 2014-07-28 ENCOUNTER — Emergency Department (HOSPITAL_COMMUNITY): Payer: Medicaid - Out of State

## 2014-07-28 ENCOUNTER — Emergency Department (HOSPITAL_COMMUNITY)
Admission: EM | Admit: 2014-07-28 | Discharge: 2014-07-29 | Disposition: A | Discharge status: Home / Self Care | Payer: Medicaid - Out of State | Attending: Emergency Medicine | Admitting: Emergency Medicine

## 2014-07-28 ENCOUNTER — Encounter (HOSPITAL_COMMUNITY): Payer: Self-pay | Admitting: *Deleted

## 2014-07-28 DIAGNOSIS — M791 Myalgia, unspecified site: Secondary | ICD-10-CM

## 2014-07-28 DIAGNOSIS — R112 Nausea with vomiting, unspecified: Secondary | ICD-10-CM | POA: Insufficient documentation

## 2014-07-28 DIAGNOSIS — I10 Essential (primary) hypertension: Secondary | ICD-10-CM | POA: Insufficient documentation

## 2014-07-28 DIAGNOSIS — R109 Unspecified abdominal pain: Secondary | ICD-10-CM

## 2014-07-28 DIAGNOSIS — J45909 Unspecified asthma, uncomplicated: Secondary | ICD-10-CM | POA: Insufficient documentation

## 2014-07-28 DIAGNOSIS — Z862 Personal history of diseases of the blood and blood-forming organs and certain disorders involving the immune mechanism: Secondary | ICD-10-CM | POA: Insufficient documentation

## 2014-07-28 DIAGNOSIS — M549 Dorsalgia, unspecified: Secondary | ICD-10-CM | POA: Diagnosis not present

## 2014-07-28 DIAGNOSIS — Z91040 Latex allergy status: Secondary | ICD-10-CM | POA: Insufficient documentation

## 2014-07-28 DIAGNOSIS — G8929 Other chronic pain: Secondary | ICD-10-CM | POA: Diagnosis not present

## 2014-07-28 DIAGNOSIS — B9689 Other specified bacterial agents as the cause of diseases classified elsewhere: Secondary | ICD-10-CM

## 2014-07-28 DIAGNOSIS — N760 Acute vaginitis: Secondary | ICD-10-CM | POA: Insufficient documentation

## 2014-07-28 DIAGNOSIS — Z720 Tobacco use: Secondary | ICD-10-CM | POA: Insufficient documentation

## 2014-07-28 DIAGNOSIS — Z3202 Encounter for pregnancy test, result negative: Secondary | ICD-10-CM | POA: Diagnosis not present

## 2014-07-28 DIAGNOSIS — R52 Pain, unspecified: Secondary | ICD-10-CM | POA: Diagnosis present

## 2014-07-28 HISTORY — DX: Other chronic pain: G89.29

## 2014-07-28 HISTORY — DX: Unspecified abdominal pain: R10.9

## 2014-07-28 HISTORY — DX: Fibromyalgia: M79.7

## 2014-07-28 LAB — CBC WITH DIFFERENTIAL/PLATELET
BASOS PCT: 1 % (ref 0–1)
Basophils Absolute: 0 10*3/uL (ref 0.0–0.1)
EOS ABS: 0.2 10*3/uL (ref 0.0–0.7)
EOS PCT: 3 % (ref 0–5)
HCT: 37.2 % (ref 36.0–46.0)
HEMOGLOBIN: 13 g/dL (ref 12.0–15.0)
LYMPHS PCT: 49 % — AB (ref 12–46)
Lymphs Abs: 3.8 10*3/uL (ref 0.7–4.0)
MCH: 28.8 pg (ref 26.0–34.0)
MCHC: 34.9 g/dL (ref 30.0–36.0)
MCV: 82.3 fL (ref 78.0–100.0)
Monocytes Absolute: 0.7 10*3/uL (ref 0.1–1.0)
Monocytes Relative: 9 % (ref 3–12)
NEUTROS ABS: 2.9 10*3/uL (ref 1.7–7.7)
NEUTROS PCT: 38 % — AB (ref 43–77)
PLATELETS: 232 10*3/uL (ref 150–400)
RBC: 4.52 MIL/uL (ref 3.87–5.11)
RDW: 13.1 % (ref 11.5–15.5)
WBC: 7.6 10*3/uL (ref 4.0–10.5)

## 2014-07-28 LAB — RETICULOCYTES
RBC.: 4.52 MIL/uL (ref 3.87–5.11)
Retic Count, Absolute: 90.4 10*3/uL (ref 19.0–186.0)
Retic Ct Pct: 2 % (ref 0.4–3.1)

## 2014-07-28 LAB — COMPREHENSIVE METABOLIC PANEL
ALT: 8 U/L — ABNORMAL LOW (ref 14–54)
ANION GAP: 6 (ref 5–15)
AST: 13 U/L — AB (ref 15–41)
Albumin: 3.7 g/dL (ref 3.5–5.0)
Alkaline Phosphatase: 34 U/L — ABNORMAL LOW (ref 38–126)
BILIRUBIN TOTAL: 0.5 mg/dL (ref 0.3–1.2)
BUN: 7 mg/dL (ref 6–20)
CO2: 22 mmol/L (ref 22–32)
Calcium: 8.3 mg/dL — ABNORMAL LOW (ref 8.9–10.3)
Chloride: 111 mmol/L (ref 101–111)
Creatinine, Ser: 0.54 mg/dL (ref 0.44–1.00)
Glucose, Bld: 83 mg/dL (ref 65–99)
POTASSIUM: 3.4 mmol/L — AB (ref 3.5–5.1)
Sodium: 139 mmol/L (ref 135–145)
TOTAL PROTEIN: 6.4 g/dL — AB (ref 6.5–8.1)

## 2014-07-28 LAB — I-STAT CG4 LACTIC ACID, ED: LACTIC ACID, VENOUS: 1.07 mmol/L (ref 0.5–2.0)

## 2014-07-28 LAB — LIPASE, BLOOD: Lipase: 22 U/L (ref 22–51)

## 2014-07-28 MED ORDER — ONDANSETRON HCL 4 MG/2ML IJ SOLN
INTRAMUSCULAR | Status: AC
  Administered 2014-07-28: 4 mg
  Filled 2014-07-28: qty 2

## 2014-07-28 MED ORDER — MORPHINE SULFATE 4 MG/ML IJ SOLN
INTRAMUSCULAR | Status: AC
  Administered 2014-07-28: 4 mg
  Filled 2014-07-28: qty 1

## 2014-07-28 MED ORDER — ONDANSETRON HCL 4 MG/2ML IJ SOLN
4.0000 mg | Freq: Once | INTRAMUSCULAR | Status: AC
  Administered 2014-07-28: 4 mg via INTRAVENOUS
  Filled 2014-07-28: qty 2

## 2014-07-28 MED ORDER — SODIUM CHLORIDE 0.9 % IV BOLUS (SEPSIS)
1000.0000 mL | Freq: Once | INTRAVENOUS | Status: AC
  Administered 2014-07-28: 1000 mL via INTRAVENOUS

## 2014-07-28 MED ORDER — MORPHINE SULFATE 4 MG/ML IJ SOLN
4.0000 mg | Freq: Once | INTRAMUSCULAR | Status: AC
  Administered 2014-07-28: 4 mg via INTRAVENOUS
  Filled 2014-07-28: qty 1

## 2014-07-28 NOTE — ED Notes (Signed)
Patient states that she is not able to void. Getting IV fluids at this time.

## 2014-07-28 NOTE — ED Notes (Signed)
Patient asking for pain and nausea medication at this time. Will check with nurse.

## 2014-07-28 NOTE — ED Notes (Signed)
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

## 2014-07-28 NOTE — ED Provider Notes (Signed)
CSN: [id number]   Arrival date & time 07/28/14 1847  History  This chart was scribed for  Glynn Octave, MD by Bethel Born, ED Scribe. This patient was seen in room APA06/[facility]06 and the patient's care was started at 9:19 PM.  Chief Complaint  Patient presents with  . Generalized Body Aches    HPI The history is provided by the patient. No language interpreter was used.   Tanya Krause is a 29 y.o. female with PMHx of sickle cell anemia and fibromyalgia who presents to the Emergency Department complaining of back and LE pain with onset 2 days ago. The pain is consistent with previous episodes of fibromyalgia and she notes that it is bad enough to cause her legs to "give out". She rates the pain 9/10 in severity and describes it as aching.  Associated fever of 100.2, nausea, vomiting, right-sided abdominal pain, LE weakness, and foot swelling. Pt denies SOB, cough, chest pain, dysuria, hematuria . She recently moved here and states that she is trying to be seen at [facility] but has been unsuccessful so far. She has been out of Percocet and hydrocodone for 1 month. LNMP was at the beginning of the month.   Past Medical History  Diagnosis Date  . Hypertension   . Migraine   . Asthma   . Pelvic inflammatory disease (PID)   . Sickle cell disease   . Fibromyalgia   . Chronic abdominal pain     Past Surgical History  Procedure Laterality Date  . Tubal ligation    . Kidney infections    . Wisdom tooth extraction      Family History  Problem Relation Age of Onset  . Hypertension Mother   . Diabetes Mother   . Heart failure Father     History  Substance Use Topics  . Smoking status: Light Tobacco Smoker    Types: Cigarettes  . Smokeless tobacco: Never Used     Comment: Occasional smoker  . Alcohol Use: Yes     Comment: occasionally-once a month     Review of Systems  Respiratory: Negative for cough and shortness of breath.   Gastrointestinal: Positive for nausea, vomiting and  abdominal pain.  Musculoskeletal: Positive for myalgias and back pain.       Foot swelling  Neurological:       LE weakness  All other systems reviewed and are negative.   Home Medications   Prior to Admission medications   Medication Sig Start Date End Date Taking? Authorizing Provider  acetaminophen (TYLENOL) 500 MG tablet Take 500 mg by mouth every 6 (six) hours as needed for pain.    Yes Historical Provider, MD  albuterol (PROVENTIL HFA;VENTOLIN HFA) 108 (90 BASE) MCG/ACT inhaler Inhale 2 puffs into the lungs every 6 (six) hours as needed for wheezing or shortness of breath. 01/14/12  Yes Consuello Masse, MD    Allergies  Tomato; Aspirin; Doxycycline; Hydromorphone hcl; Ibuprofen; Metoclopramide; Other; Shellfish allergy; Toradol; and Latex  Triage Vitals: BP 138/90 mmHg  Pulse 63  Temp(Src) 98.5 F (36.9 C) (Oral)  Resp 20  Ht 5' (1.524 m)  Wt 165 lb (74.844 kg)  BMI 32.22 kg/m2  SpO2 100%  LMP 07/21/2014  Physical Exam  Constitutional: She is oriented to person, place, and time. She appears well-developed and well-nourished. No distress.  HENT:  Head: Normocephalic and atraumatic.  Mouth/Throat: Oropharynx is clear and moist. No oropharyngeal exudate.  Eyes: Conjunctivae and EOM are normal. Pupils are equal, round,  and reactive to light.  Neck: Normal range of motion. Neck supple.  No meningismus.  Cardiovascular: Normal rate, regular rhythm, normal heart sounds and intact distal pulses.   No murmur heard. Pulmonary/Chest: Effort normal and breath sounds normal. No respiratory distress. She has no decreased breath sounds. She has no wheezes. She has no rhonchi. She has no rales.  Abdominal: Soft. There is tenderness in the right lower quadrant. There is no rebound, no guarding and no CVA tenderness.  Genitourinary: Vaginal discharge found.  Chaperone present, normal external genitalia.  Scant discharge in vault. No CMT.  TTP diffusely in lower abdomen without  lateralizing tenderness  Musculoskeletal: Normal range of motion. She exhibits no edema or tenderness.  Diffuse myalgias Compartments are soft   Neurological: She is alert and oriented to person, place, and time. No cranial nerve deficit. She exhibits normal muscle tone. Coordination normal.  No ataxia on finger to nose bilaterally. No pronator drift. 5/5 strength throughout. CN 2-12 intact. Negative Romberg. Equal grip strength. Sensation intact. Gait is normal.   Skin: Skin is warm.  Psychiatric: She has a normal mood and affect. Her behavior is normal.  Nursing note and vitals reviewed.   ED Course  Procedures   DIAGNOSTIC STUDIES: Oxygen Saturation is 100% on RA, normal by my interpretation.    COORDINATION OF CARE: 9:28 PM Discussed treatment plan which includes labs work with pt at bedside and pt agreed to plan.  Labs Review-  Labs Reviewed  COMPREHENSIVE METABOLIC PANEL - Abnormal; Notable for the following:    Potassium 3.4 (*)    Calcium 8.3 (*)    Total Protein 6.4 (*)    AST 13 (*)    ALT 8 (*)    Alkaline Phosphatase 34 (*)    All other components within normal limits  CBC WITH DIFFERENTIAL/PLATELET - Abnormal; Notable for the following:    Neutrophils Relative % 38 (*)    Lymphocytes Relative 49 (*)    All other components within normal limits  CULTURE, BLOOD (ROUTINE X 2)  CULTURE, BLOOD (ROUTINE X 2)  LIPASE, BLOOD  RETICULOCYTES  PREGNANCY, URINE  URINALYSIS, ROUTINE W REFLEX MICROSCOPIC (NOT AT [facility])  I-STAT CG4 LACTIC ACID, ED  I-STAT BETA HCG BLOOD, ED ([facility], [facility], [facility] ONLY)    Imaging Review No results found.  EKG Interpretation None      MDM   Final diagnoses:  None   Generalized body aches with history of sickle cell anemia, fibromyalgia, states unable to tolerate by mouth for the past several days. Out of chronic pain meds x 1 month.  Reports pain to back, legs, arms similar to fibromyalgia exacerbations. She does not think this is her  sickle cell. Reports fever to 100.2 at home yesterday. No chest pain or SOB. Urinalysis is contaminated.  HCG is negative. Chest x-ray negative.  Patient with right lower quadrant pain on abdominal exam. Labs reassuring.  No hemolysis. No leukocytosis. retics adequate. CT pending to evaluate appendix at time of sign out. Patient understands that prescriptions will not be provided for chronic pain.   I personally performed the services described in this documentation, which was scribed in my presence. The recorded information has been reviewed and is accurate.    Glynn Octave, MD 07/29/14 [phone]

## 2014-07-29 LAB — WET PREP, GENITAL
Trich, Wet Prep: NONE SEEN
Yeast Wet Prep HPF POC: NONE SEEN

## 2014-07-29 LAB — URINALYSIS, ROUTINE W REFLEX MICROSCOPIC
BILIRUBIN URINE: NEGATIVE
Glucose, UA: NEGATIVE mg/dL
KETONES UR: 15 mg/dL — AB
NITRITE: NEGATIVE
Protein, ur: NEGATIVE mg/dL
SPECIFIC GRAVITY, URINE: 1.025 (ref 1.005–1.030)
UROBILINOGEN UA: 0.2 mg/dL (ref 0.0–1.0)
pH: 6 (ref 5.0–8.0)

## 2014-07-29 LAB — URINE MICROSCOPIC-ADD ON

## 2014-07-29 LAB — PREGNANCY, URINE: Preg Test, Ur: NEGATIVE

## 2014-07-29 MED ORDER — IOHEXOL 300 MG/ML  SOLN
100.0000 mL | Freq: Once | INTRAMUSCULAR | Status: AC | PRN
  Administered 2014-07-29: 100 mL via INTRAVENOUS

## 2014-07-29 MED ORDER — PROMETHAZINE HCL 25 MG/ML IJ SOLN
25.0000 mg | Freq: Once | INTRAMUSCULAR | Status: AC
Start: 2014-07-29 — End: 2014-07-29
  Administered 2014-07-29: 25 mg via INTRAVENOUS
  Filled 2014-07-29: qty 1

## 2014-07-29 MED ORDER — IOHEXOL 300 MG/ML  SOLN
50.0000 mL | Freq: Once | INTRAMUSCULAR | Status: AC | PRN
  Administered 2014-07-29: 50 mL via ORAL

## 2014-07-29 MED ORDER — CYCLOBENZAPRINE HCL 10 MG PO TABS
10.0000 mg | ORAL_TABLET | Freq: Once | ORAL | Status: AC
  Administered 2014-07-29: 10 mg via ORAL
  Filled 2014-07-29: qty 1

## 2014-07-29 MED ORDER — CYCLOBENZAPRINE HCL 10 MG PO TABS: 10.0000 mg | ORAL_TABLET | Freq: Three times a day (TID) | ORAL | Status: DC | PRN

## 2014-07-29 MED ORDER — METRONIDAZOLE 500 MG PO TABS: 500.0000 mg | ORAL_TABLET | Freq: Two times a day (BID) | ORAL | Status: DC

## 2014-07-29 NOTE — ED Provider Notes (Signed)
Patient left the changes shift to get her CT results. Patient seen ambulating to the bathroom in no distress. Patient was given her CT results. She states she still having pain. She was given Flexeril for her fibromyalgia pain. She then slept throughout the rest of her ED visit and was ready for discharge.  Dg Chest 2 View  07/29/2014   CLINICAL DATA:  Generalized body aches, upper back pain, and vomiting for 2 days. History of hypertension, asthma, sickle cell, smoker.  EXAM: CHEST  2 VIEW  COMPARISON:  06/06/2012  FINDINGS: The heart size and mediastinal contours are within normal limits. Both lungs are clear. The visualized skeletal structures are unremarkable. Linear opacities and lucencies projected over the base of the neck on the left probably represent hair shadows.  IMPRESSION: No active cardiopulmonary disease.   Electronically Signed   By: Burman Nieves M.D.   On: 07/29/2014 00:10   Ct Abdomen Pelvis W Contrast  07/29/2014   CLINICAL DATA:  Sickle cell anemia and fibromyalgia. Now complains of back and lower extremity pain onset 2 days ago. Fever, nausea and vomiting, right-sided abdominal pain, foot swelling.  EXAM: CT ABDOMEN AND PELVIS WITH CONTRAST  TECHNIQUE: Multidetector CT imaging of the abdomen and pelvis was performed using the standard protocol following bolus administration of intravenous contrast.  CONTRAST:  73mL OMNIPAQUE IOHEXOL 300 MG/ML SOLN, OMNIPAQUE IOHEXOL 300 MG/ML SOLN  COMPARISON:  10/04/2012  FINDINGS: Lung bases are clear.  Tiny sub cm lesion in the inferior right lobe of the liver probably represents a small cyst. Liver is otherwise normal. Gallbladder, spleen, pancreas, adrenal glands, kidneys, abdominal aorta, inferior vena cava, and retroperitoneal lymph nodes are unremarkable. Stomach, small bowel, and colon are not abnormally distended. No free air or free fluid in the abdomen. Abdominal wall musculature appears intact.  Pelvis: Appendix is normal. Small  amount of free fluid in the pelvis is likely physiologic. Uterus and ovaries are not enlarged. Bladder wall is not thickened. No pelvic mass or lymphadenopathy. No destructive bone lesions.  IMPRESSION: Physiologic free fluid in the pelvis. No acute inflammatory process.   Electronically Signed   By: Burman Nieves M.D.   On: 07/29/2014 00:51    Diagnoses that have been ruled out:  None  Diagnoses that are still under consideration:  None  Final diagnoses:  Myalgia  Abdominal pain, unspecified abdominal location  BV (bacterial vaginosis)    Discharge Medication List as of 07/29/2014  5:01 AM    START taking these medications   Details  cyclobenzaprine (FLEXERIL) 10 MG tablet Take 1 tablet (10 mg total) by mouth 3 (three) times daily as needed for muscle spasms., Starting 07/29/2014, Until Discontinued, Print    metroNIDAZOLE (FLAGYL) 500 MG tablet Take 1 tablet (500 mg total) by mouth 2 (two) times daily., Starting 07/29/2014, Until Discontinued, Print        Plan discharge  PA needs to finish chart   Devoria Albe, MD 07/29/14 [phone]

## 2014-07-29 NOTE — Discharge Instructions (Signed)
Take the flexeril for your body aches. I gave you a couple of numbers to call to get a primary care doctor. Take the antibiotics until gone for your vaginal infection.     Bacterial Vaginosis Bacterial vaginosis is a vaginal infection that occurs when the normal balance of bacteria in the vagina is disrupted. It results from an overgrowth of certain bacteria. This is the most common vaginal infection in women of childbearing age. Treatment is important to prevent complications, especially in pregnant women, as it can cause a premature delivery. CAUSES  Bacterial vaginosis is caused by an increase in harmful bacteria that are normally present in smaller amounts in the vagina. Several different kinds of bacteria can cause bacterial vaginosis. However, the reason that the condition develops is not fully understood. RISK FACTORS Certain activities or behaviors can put you at an increased risk of developing bacterial vaginosis, including:  Having a new sex partner or multiple sex partners.  Douching.  Using an intrauterine device (IUD) for contraception. Women do not get bacterial vaginosis from toilet seats, bedding, swimming pools, or contact with objects around them. SIGNS AND SYMPTOMS  Some women with bacterial vaginosis have no signs or symptoms. Common symptoms include:  Grey vaginal discharge.  A fishlike odor with discharge, especially after sexual intercourse.  Itching or burning of the vagina and vulva.  Burning or pain with urination. DIAGNOSIS  Your health care provider will take a medical history and examine the vagina for signs of bacterial vaginosis. A sample of vaginal fluid may be taken. Your health care provider will look at this sample under a microscope to check for bacteria and abnormal cells. A vaginal pH test may also be done.  TREATMENT  Bacterial vaginosis may be treated with antibiotic medicines. These may be given in the form of a pill or a vaginal cream. A second  round of antibiotics may be prescribed if the condition comes back after treatment.  HOME CARE INSTRUCTIONS   Only take over-the-counter or prescription medicines as directed by your health care provider.  If antibiotic medicine was prescribed, take it as directed. Make sure you finish it even if you start to feel better.  Do not have sex until treatment is completed.  Tell all sexual partners that you have a vaginal infection. They should see their health care provider and be treated if they have problems, such as a mild rash or itching.  Practice safe sex by using condoms and only having one sex partner. SEEK MEDICAL CARE IF:   Your symptoms are not improving after 3 days of treatment.  You have increased discharge or pain.  You have a fever. MAKE SURE YOU:   Understand these instructions.  Will watch your condition.  Will get help right away if you are not doing well or get worse. FOR MORE INFORMATION  Centers for Disease Control and Prevention, Division of STD Prevention: [employer]Apps.[employer].za American Sexual Health Association (ASHA): www.ashastd.org  Document Released: 01/03/2005 Document Revised: 10/24/2012 Document Reviewed: 08/15/2012 [facility] Patient Information 2015 [place], [place]. This information is not intended to replace advice given to you by your health care provider. Make sure you discuss any questions you have with your health care provider.

## 2014-07-30 LAB — GC/CHLAMYDIA PROBE AMP (~~LOC~~) NOT AT ARMC
Chlamydia: NEGATIVE
Neisseria Gonorrhea: NEGATIVE

## 2014-08-04 LAB — CULTURE, BLOOD (ROUTINE X 2)
CULTURE: NO GROWTH
Culture: NO GROWTH

## 2014-08-11 ENCOUNTER — Emergency Department (HOSPITAL_COMMUNITY): Payer: Medicaid - Out of State

## 2014-08-11 ENCOUNTER — Emergency Department (HOSPITAL_COMMUNITY)
Admission: EM | Admit: 2014-08-11 | Discharge: 2014-08-11 | Disposition: A | Discharge status: Home / Self Care | Payer: Medicaid - Out of State | Attending: Emergency Medicine | Admitting: Emergency Medicine

## 2014-08-11 ENCOUNTER — Encounter (HOSPITAL_COMMUNITY): Payer: Self-pay | Admitting: *Deleted

## 2014-08-11 DIAGNOSIS — Z79899 Other long term (current) drug therapy: Secondary | ICD-10-CM | POA: Insufficient documentation

## 2014-08-11 DIAGNOSIS — Z8742 Personal history of other diseases of the female genital tract: Secondary | ICD-10-CM | POA: Diagnosis not present

## 2014-08-11 DIAGNOSIS — G43909 Migraine, unspecified, not intractable, without status migrainosus: Secondary | ICD-10-CM | POA: Insufficient documentation

## 2014-08-11 DIAGNOSIS — R1084 Generalized abdominal pain: Secondary | ICD-10-CM | POA: Diagnosis not present

## 2014-08-11 DIAGNOSIS — Z9851 Tubal ligation status: Secondary | ICD-10-CM | POA: Diagnosis not present

## 2014-08-11 DIAGNOSIS — R102 Pelvic and perineal pain: Secondary | ICD-10-CM | POA: Insufficient documentation

## 2014-08-11 DIAGNOSIS — Z720 Tobacco use: Secondary | ICD-10-CM | POA: Insufficient documentation

## 2014-08-11 DIAGNOSIS — Z3202 Encounter for pregnancy test, result negative: Secondary | ICD-10-CM | POA: Diagnosis not present

## 2014-08-11 DIAGNOSIS — R109 Unspecified abdominal pain: Secondary | ICD-10-CM | POA: Diagnosis present

## 2014-08-11 DIAGNOSIS — I10 Essential (primary) hypertension: Secondary | ICD-10-CM | POA: Insufficient documentation

## 2014-08-11 DIAGNOSIS — G8929 Other chronic pain: Secondary | ICD-10-CM | POA: Insufficient documentation

## 2014-08-11 DIAGNOSIS — Z862 Personal history of diseases of the blood and blood-forming organs and certain disorders involving the immune mechanism: Secondary | ICD-10-CM | POA: Diagnosis not present

## 2014-08-11 DIAGNOSIS — F419 Anxiety disorder, unspecified: Secondary | ICD-10-CM | POA: Insufficient documentation

## 2014-08-11 DIAGNOSIS — J45909 Unspecified asthma, uncomplicated: Secondary | ICD-10-CM | POA: Diagnosis not present

## 2014-08-11 DIAGNOSIS — Z91040 Latex allergy status: Secondary | ICD-10-CM | POA: Diagnosis not present

## 2014-08-11 DIAGNOSIS — M791 Myalgia: Secondary | ICD-10-CM | POA: Insufficient documentation

## 2014-08-11 LAB — CBC WITH DIFFERENTIAL/PLATELET
BASOS PCT: 0 % (ref 0–1)
Basophils Absolute: 0 10*3/uL (ref 0.0–0.1)
Eosinophils Absolute: 0.2 10*3/uL (ref 0.0–0.7)
Eosinophils Relative: 2 % (ref 0–5)
HEMATOCRIT: 40.6 % (ref 36.0–46.0)
Hemoglobin: 14 g/dL (ref 12.0–15.0)
Lymphocytes Relative: 26 % (ref 12–46)
Lymphs Abs: 2.7 10*3/uL (ref 0.7–4.0)
MCH: 28.5 pg (ref 26.0–34.0)
MCHC: 34.5 g/dL (ref 30.0–36.0)
MCV: 82.7 fL (ref 78.0–100.0)
Monocytes Absolute: 0.8 10*3/uL (ref 0.1–1.0)
Monocytes Relative: 8 % (ref 3–12)
Neutro Abs: 6.8 10*3/uL (ref 1.7–7.7)
Neutrophils Relative %: 64 % (ref 43–77)
PLATELETS: 264 10*3/uL (ref 150–400)
RBC: 4.91 MIL/uL (ref 3.87–5.11)
RDW: 13.2 % (ref 11.5–15.5)
WBC: 10.5 10*3/uL (ref 4.0–10.5)

## 2014-08-11 LAB — URINALYSIS, ROUTINE W REFLEX MICROSCOPIC
Bilirubin Urine: NEGATIVE
Glucose, UA: NEGATIVE mg/dL
Ketones, ur: NEGATIVE mg/dL
LEUKOCYTES UA: NEGATIVE
Nitrite: NEGATIVE
Protein, ur: NEGATIVE mg/dL
Specific Gravity, Urine: >1.03 — ABNORMAL HIGH (ref 1.005–1.030)
Urobilinogen, UA: 0.2 mg/dL (ref 0.0–1.0)
pH: 6 (ref 5.0–8.0)

## 2014-08-11 LAB — COMPREHENSIVE METABOLIC PANEL
ALBUMIN: 4.1 g/dL (ref 3.5–5.0)
ALT: 11 U/L — ABNORMAL LOW (ref 14–54)
ANION GAP: 5 (ref 5–15)
AST: 15 U/L (ref 15–41)
Alkaline Phosphatase: 39 U/L (ref 38–126)
BUN: 9 mg/dL (ref 6–20)
CHLORIDE: 108 mmol/L (ref 101–111)
CO2: 25 mmol/L (ref 22–32)
Calcium: 9.2 mg/dL (ref 8.9–10.3)
Creatinine, Ser: 0.59 mg/dL (ref 0.44–1.00)
GFR calc Af Amer: >60 mL/min (ref >60)
Glucose, Bld: 94 mg/dL (ref 65–99)
POTASSIUM: 4.1 mmol/L (ref 3.5–5.1)
Sodium: 138 mmol/L (ref 135–145)
Total Bilirubin: 0.4 mg/dL (ref 0.3–1.2)
Total Protein: 7.6 g/dL (ref 6.5–8.1)

## 2014-08-11 LAB — URINE MICROSCOPIC-ADD ON

## 2014-08-11 LAB — PREGNANCY, URINE: PREG TEST UR: NEGATIVE

## 2014-08-11 LAB — LIPASE, BLOOD: Lipase: 21 U/L — ABNORMAL LOW (ref 22–51)

## 2014-08-11 MED ORDER — PROMETHAZINE HCL 25 MG PO TABS: 25.0000 mg | ORAL_TABLET | Freq: Four times a day (QID) | ORAL | Status: DC | PRN

## 2014-08-11 MED ORDER — MORPHINE SULFATE 4 MG/ML IJ SOLN
4.0000 mg | Freq: Once | INTRAMUSCULAR | Status: AC
Start: 2014-08-11 — End: 2014-08-11
  Administered 2014-08-11: 4 mg via INTRAVENOUS
  Filled 2014-08-11: qty 1

## 2014-08-11 MED ORDER — PROMETHAZINE HCL 25 MG/ML IJ SOLN
25.0000 mg | Freq: Once | INTRAMUSCULAR | Status: AC
  Administered 2014-08-11: 25 mg via INTRAVENOUS
  Filled 2014-08-11: qty 1

## 2014-08-11 MED ORDER — ONDANSETRON HCL 4 MG/2ML IJ SOLN
4.0000 mg | Freq: Once | INTRAMUSCULAR | Status: AC
  Administered 2014-08-11: 4 mg via INTRAVENOUS
  Filled 2014-08-11: qty 2

## 2014-08-11 MED ORDER — SODIUM CHLORIDE 0.9 % IV BOLUS (SEPSIS)
1000.0000 mL | Freq: Once | INTRAVENOUS | Status: AC
  Administered 2014-08-11: 1000 mL via INTRAVENOUS

## 2014-08-11 NOTE — ED Notes (Signed)
Pt tolerating po fluids.  Requesting pain medications.

## 2014-08-11 NOTE — ED Notes (Signed)
Discharge instructions reviewed with patient. Patient unhappy with care received. Stated we did "nothing for me and no wonder people die here". Patient states she has none of her prescribed medication at home. Informed patient that the ED was unable to prescribe regular medications for patient. At this, patient stated RN could "shut the fuck up". Patient informed patient could not speak to staff this way. Patient remained agitated and refused to sign for discharge. Walked out of department in no distress.

## 2014-08-11 NOTE — ED Notes (Signed)
Pt states she needs more pain and nausea medication.  States that Zofran never works for her.

## 2014-08-11 NOTE — Discharge Instructions (Signed)
Abdominal Pain  the blood supply ovaries is normal. Establish care with a primary doctor. Return to the ED for new or  worsening symptoms. Many things can cause abdominal pain. Usually, abdominal pain is not caused by a disease and will improve without treatment. It can often be observed and treated at home. Your health care provider will do a physical exam and possibly order blood tests and X-rays to help determine the seriousness of your pain. However, in many cases, more time must pass before a clear cause of the pain can be found. Before that point, your health care provider may not know if you need more testing or further treatment. HOME CARE INSTRUCTIONS  Monitor your abdominal pain for any changes. The following actions may help to alleviate any discomfort you are experiencing:  Only take over-the-counter or prescription medicines as directed by your health care provider.  Do not take laxatives unless directed to do so by your health care provider.  Try a clear liquid diet (broth, tea, or water) as directed by your health care provider. Slowly move to a bland diet as tolerated. SEEK MEDICAL CARE IF:  You have unexplained abdominal pain.  You have abdominal pain associated with nausea or diarrhea.  You have pain when you urinate or have a bowel movement.  You experience abdominal pain that wakes you in the night.  You have abdominal pain that is worsened or improved by eating food.  You have abdominal pain that is worsened with eating fatty foods.  You have a fever. SEEK IMMEDIATE MEDICAL CARE IF:   Your pain does not go away within 2 hours.  You keep throwing up (vomiting).  Your pain is felt only in portions of the abdomen, such as the right side or the left lower portion of the abdomen.  You pass bloody or black tarry stools. MAKE SURE YOU:  Understand these instructions.   Will watch your condition.   Will get help right away if you are not doing well or get  worse.  Document Released: 10/13/2004 Document Revised: 01/08/2013 Document Reviewed: 09/12/2012 [facility] Patient Information 2015 [place], [place]. This information is not intended to replace advice given to you by your health care provider. Make sure you discuss any questions you have with your health care provider.

## 2014-08-11 NOTE — ED Notes (Signed)
Pt states she is unable to provide a UA at this time.

## 2014-08-11 NOTE — ED Provider Notes (Signed)
History  This chart was scribed for Tanya Octave, MD by Karle Plumber, ED Scribe. This patient was seen in room APA01/APA01 and the patient's care was started at 12:03 PM.  Chief Complaint  Patient presents with  . Abdominal Pain   The history is provided by the patient and medical records. No language interpreter was used.    HPI Comments:  Tanya Krause is a 29 y.o. female with PMHx of chronic abdominal pain, sickle cell disease and fibromyalgia who presents to the Emergency Department complaining of severe, sharp abdominal cramping she describes as contractions that began this morning about ten hours ago. She reports associated nausea, vomiting (5 episodes) and diarrhea that also began this morning. She reports generalized body aches and lower back pain that began 2-3 days ago. She reports fever Tmax 101 degrees that she treated with OTC Tylenol. Denies taking anything else for pain. She denies modifying factors. She denies cough, CP, sore throat or rhinorrhea. Pt was seen here approximately two weeks ago and states it was for similar symptoms but states it feels different today and is much worse. She was treated with Flagyl and Flexeril that she has finished. Reports two previous abdominal surgeries.  Past Medical History  Diagnosis Date  . Hypertension   . Migraine   . Asthma   . Pelvic inflammatory disease (PID)   . Sickle cell disease   . Fibromyalgia   . Chronic abdominal pain    Past Surgical History  Procedure Laterality Date  . Tubal ligation    . Kidney infections    . Wisdom tooth extraction     Family History  Problem Relation Age of Onset  . Hypertension Mother   . Diabetes Mother   . Heart failure Father    History  Substance Use Topics  . Smoking status: Light Tobacco Smoker    Types: Cigarettes  . Smokeless tobacco: Never Used     Comment: Occasional smoker  . Alcohol Use: Yes     Comment: occasionally-once a month   OB History    Gravida Para  Term Preterm AB TAB SAB Ectopic Multiple Living   4 4 1 3      4      Review of Systems  Allergies  Tomato; Aspirin; Doxycycline; Hydromorphone hcl; Ibuprofen; Metoclopramide; Other; Shellfish allergy; Toradol; and Latex  Home Medications   Prior to Admission medications   Medication Sig Start Date End Date Taking? Authorizing Provider  cyclobenzaprine (FLEXERIL) 10 MG tablet Take 1 tablet (10 mg total) by mouth 3 (three) times daily as needed for muscle spasms. 07/29/14  Yes 09/29/14, MD  acetaminophen (TYLENOL) 500 MG tablet Take 500 mg by mouth every 6 (six) hours as needed for pain.     Historical Provider, MD  albuterol (PROVENTIL HFA;VENTOLIN HFA) 108 (90 BASE) MCG/ACT inhaler Inhale 2 puffs into the lungs every 6 (six) hours as needed for wheezing or shortness of breath. 01/14/12   01/16/12, MD  metroNIDAZOLE (FLAGYL) 500 MG tablet Take 1 tablet (500 mg total) by mouth 2 (two) times daily. Patient not taking: Reported on 08/11/2014 07/29/14   09/29/14, MD  promethazine (PHENERGAN) 25 MG tablet Take 1 tablet (25 mg total) by mouth every 6 (six) hours as needed for nausea or vomiting. 08/11/14   08/13/14, MD   Triage Vitals: BP 130/83 mmHg  Pulse 109  Temp(Src) 98 F (36.7 C) (Oral)  Resp 20  Ht 5' (1.524 m)  Wt 167 lb (  75.751 kg)  BMI 32.62 kg/m2  SpO2 100%  LMP 08/04/2014 Physical Exam  Constitutional: She is oriented to person, place, and time. She appears well-developed and well-nourished. No distress.  HENT:  Head: Normocephalic and atraumatic.  Mouth/Throat: Oropharynx is clear and moist. No oropharyngeal exudate.  Eyes: Conjunctivae and EOM are normal. Pupils are equal, round, and reactive to light.  Neck: Normal range of motion. Neck supple.  No meningismus.  Cardiovascular: Normal rate, regular rhythm, normal heart sounds and intact distal pulses.   No murmur heard. Pulmonary/Chest: Effort normal and breath sounds normal. No respiratory distress.   Abdominal: Soft. There is tenderness. There is guarding (voluntary). There is no rebound and no CVA tenderness.  Diffused tenderness with voluntary guarding  Musculoskeletal: Normal range of motion. She exhibits no edema or tenderness.  Diffused myalgias but compartments soft.  Neurological: She is alert and oriented to person, place, and time. No cranial nerve deficit. She exhibits normal muscle tone. Coordination normal.  No ataxia on finger to nose bilaterally. No pronator drift. 5/5 strength throughout. CN 2-12 intact. Negative Romberg. Equal grip strength. Sensation intact. Gait is normal.   Skin: Skin is warm.  Psychiatric: Her behavior is normal. Her mood appears anxious.  Tearful  Nursing note and vitals reviewed.   ED Course  Procedures (including critical care time) DIAGNOSTIC STUDIES: Oxygen Saturation is 100% on RA, normal by my interpretation.   COORDINATION OF CARE: 12:09 PM- Will order pain and nausea medication. Pt verbalizes understanding and agrees to plan.  Medications  sodium chloride 0.9 % bolus 1,000 mL (0 mLs Intravenous Stopped 08/11/14 1330)  ondansetron (ZOFRAN) injection 4 mg (4 mg Intravenous Given 08/11/14 1222)  morphine 4 MG/ML injection 4 mg (4 mg Intravenous Given 08/11/14 1222)  promethazine (PHENERGAN) injection 25 mg (25 mg Intravenous Given 08/11/14 1510)    Labs Review Labs Reviewed  COMPREHENSIVE METABOLIC PANEL - Abnormal; Notable for the following:    ALT 11 (*)    All other components within normal limits  LIPASE, BLOOD - Abnormal; Notable for the following:    Lipase 21 (*)    All other components within normal limits  URINALYSIS, ROUTINE W REFLEX MICROSCOPIC (NOT AT [facility]) - Abnormal; Notable for the following:    Specific Gravity, Urine >1.030 (*)    Hgb urine dipstick TRACE (*)    All other components within normal limits  URINE MICROSCOPIC-ADD ON - Abnormal; Notable for the following:    Squamous Epithelial / LPF MANY (*)     Bacteria, UA FEW (*)    All other components within normal limits  CBC WITH DIFFERENTIAL/PLATELET  PREGNANCY, URINE    Imaging Review US Transvaginal Non-ob  08/11/2014   CLINICAL DATA:  Pelvic pain for 1 month.  EXAM: TRANSABDOMINAL AND TRANSVAGINAL ULTRASOUND OF PELVIS  DOPPLER ULTRASOUND OF OVARIES  TECHNIQUE: Both transabdominal and transvaginal ultrasound examinations of the pelvis were performed. Transabdominal technique was performed for global imaging of the pelvis including uterus, ovaries, adnexal regions, and pelvic cul-de-sac.  It was necessary to proceed with endovaginal exam following the transabdominal exam to visualize the ovaries and adnexal structures. Color and duplex Doppler ultrasound was utilized to evaluate blood flow to the ovaries.  COMPARISON:  None.  FINDINGS: Uterus  Measurements: 7.9 x 3.6 x 5.1 cm. No fibroids or other mass visualized.  Endometrium  Thickness: 4.5 mm.  No focal abnormality visualized.  Right ovary  Measurements: 3.5 x 2.5 x 3.0 cm. Normal appearance/no adnexal mass.  Left  ovary  Measurements: 2.8 x 1.9 x 2.2 cm. Normal appearance/no adnexal mass.  Pulsed Doppler evaluation of both ovaries demonstrates normal low-resistance arterial and venous waveforms.  Other findings  Small amount of free fluid noted.  IMPRESSION: 1. Examination is negative for ovarian torsion. 2. Small amount of free fluid in the pelvis, nonspecific.   Electronically Signed   By: Signa Kell M.D.   On: 08/11/2014 16:43   US Pelvis Complete  08/11/2014   CLINICAL DATA:  Pelvic pain for 1 month.  EXAM: TRANSABDOMINAL AND TRANSVAGINAL ULTRASOUND OF PELVIS  DOPPLER ULTRASOUND OF OVARIES  TECHNIQUE: Both transabdominal and transvaginal ultrasound examinations of the pelvis were performed. Transabdominal technique was performed for global imaging of the pelvis including uterus, ovaries, adnexal regions, and pelvic cul-de-sac.  It was necessary to proceed with endovaginal exam following the  transabdominal exam to visualize the ovaries and adnexal structures. Color and duplex Doppler ultrasound was utilized to evaluate blood flow to the ovaries.  COMPARISON:  None.  FINDINGS: Uterus  Measurements: 7.9 x 3.6 x 5.1 cm. No fibroids or other mass visualized.  Endometrium  Thickness: 4.5 mm.  No focal abnormality visualized.  Right ovary  Measurements: 3.5 x 2.5 x 3.0 cm. Normal appearance/no adnexal mass.  Left ovary  Measurements: 2.8 x 1.9 x 2.2 cm. Normal appearance/no adnexal mass.  Pulsed Doppler evaluation of both ovaries demonstrates normal low-resistance arterial and venous waveforms.  Other findings  Small amount of free fluid noted.  IMPRESSION: 1. Examination is negative for ovarian torsion. 2. Small amount of free fluid in the pelvis, nonspecific.   Electronically Signed   By: Signa Kell M.D.   On: 08/11/2014 16:43   [place] Art/ven Flow Abd Pelv Doppler  08/11/2014   CLINICAL DATA:  Pelvic pain for 1 month.  EXAM: TRANSABDOMINAL AND TRANSVAGINAL ULTRASOUND OF PELVIS  DOPPLER ULTRASOUND OF OVARIES  TECHNIQUE: Both transabdominal and transvaginal ultrasound examinations of the pelvis were performed. Transabdominal technique was performed for global imaging of the pelvis including uterus, ovaries, adnexal regions, and pelvic cul-de-sac.  It was necessary to proceed with endovaginal exam following the transabdominal exam to visualize the ovaries and adnexal structures. Color and duplex Doppler ultrasound was utilized to evaluate blood flow to the ovaries.  COMPARISON:  None.  FINDINGS: Uterus  Measurements: 7.9 x 3.6 x 5.1 cm. No fibroids or other mass visualized.  Endometrium  Thickness: 4.5 mm.  No focal abnormality visualized.  Right ovary  Measurements: 3.5 x 2.5 x 3.0 cm. Normal appearance/no adnexal mass.  Left ovary  Measurements: 2.8 x 1.9 x 2.2 cm. Normal appearance/no adnexal mass.  Pulsed Doppler evaluation of both ovaries demonstrates normal low-resistance arterial and venous  waveforms.  Other findings  Small amount of free fluid noted.  IMPRESSION: 1. Examination is negative for ovarian torsion. 2. Small amount of free fluid in the pelvis, nonspecific.   Electronically Signed   By: Signa Kell M.D.   On: 08/11/2014 16:43   Dg Abd Acute W/chest  08/11/2014   CLINICAL DATA:  Abdominal pain, nausea, vomiting and diarrhea with right flank pain.  EXAM: DG ABDOMEN ACUTE W/ 1V CHEST  COMPARISON:  06/06/2012, chest x-ray 07/28/2014 and abdominal pelvic CT 07/29/2014  FINDINGS: Lungs are clear. Cardiomediastinal silhouette and remainder of the chest is unchanged.  Abdominal films demonstrate a nonobstructive bowel gas pattern with mild fecal retention over the right colon. No free peritoneal air. Remainder of the exam is unchanged.  IMPRESSION: Negative abdominal radiographs.  No  acute cardiopulmonary disease.   Electronically Signed   By: Elberta Fortis M.D.   On: 08/11/2014 15:22     EKG Interpretation None      MDM   Final diagnoses:  Pelvic pain in female  Abdominal pain, unspecified abdominal location   History of fibromyalgia and sickle cell disease presenting with abdominal cramping, nausea, vomiting, diarrhea since 3 AM. Seen for similar symptoms by myself on July 11 with negative workup including CT scan. States symptoms of the same but worse in intensity.  She reports back and leg pain similar to previous for fibromyalgias exacerbations. Febrile at 101.1 at home yesterday. No chest pain or shortness of breath.   UA contaminated. Labs unremarkable. CXR negative.  CT from July 11 benign.  Labs appear to be at baseline.   ultrasound shows no evidence of ovarian torsion. Patient tolerating by mouth fluids. She is informed to ED cannot provide chronic pain medications and she is to establish care with a PCP in the area.  I personally performed the services described in this documentation, which was scribed in my presence. The recorded information has been  reviewed and is accurate.    Tanya Octave, MD 08/11/14 2329

## 2014-08-11 NOTE — ED Notes (Signed)
Pt with right flank pain with N/V/D and dizziness.  Pt seen for same on 7/11 and per pt states it "had stopped for a few days" but has returned.

## 2014-09-09 ENCOUNTER — Encounter (HOSPITAL_COMMUNITY): Payer: Self-pay | Admitting: *Deleted

## 2014-09-09 ENCOUNTER — Emergency Department (HOSPITAL_COMMUNITY)
Admission: EM | Admit: 2014-09-09 | Discharge: 2014-09-09 | Disposition: A | Discharge status: Home / Self Care | Payer: Medicaid - Out of State | Attending: Emergency Medicine | Admitting: Emergency Medicine

## 2014-09-09 DIAGNOSIS — J45909 Unspecified asthma, uncomplicated: Secondary | ICD-10-CM | POA: Insufficient documentation

## 2014-09-09 DIAGNOSIS — Z3202 Encounter for pregnancy test, result negative: Secondary | ICD-10-CM | POA: Insufficient documentation

## 2014-09-09 DIAGNOSIS — M549 Dorsalgia, unspecified: Secondary | ICD-10-CM | POA: Insufficient documentation

## 2014-09-09 DIAGNOSIS — Z720 Tobacco use: Secondary | ICD-10-CM | POA: Insufficient documentation

## 2014-09-09 DIAGNOSIS — Z862 Personal history of diseases of the blood and blood-forming organs and certain disorders involving the immune mechanism: Secondary | ICD-10-CM | POA: Insufficient documentation

## 2014-09-09 DIAGNOSIS — I10 Essential (primary) hypertension: Secondary | ICD-10-CM | POA: Insufficient documentation

## 2014-09-09 DIAGNOSIS — Z79899 Other long term (current) drug therapy: Secondary | ICD-10-CM | POA: Insufficient documentation

## 2014-09-09 DIAGNOSIS — M542 Cervicalgia: Secondary | ICD-10-CM | POA: Insufficient documentation

## 2014-09-09 DIAGNOSIS — G43909 Migraine, unspecified, not intractable, without status migrainosus: Secondary | ICD-10-CM | POA: Insufficient documentation

## 2014-09-09 DIAGNOSIS — Z8742 Personal history of other diseases of the female genital tract: Secondary | ICD-10-CM | POA: Insufficient documentation

## 2014-09-09 DIAGNOSIS — R52 Pain, unspecified: Secondary | ICD-10-CM

## 2014-09-09 DIAGNOSIS — Z91040 Latex allergy status: Secondary | ICD-10-CM | POA: Insufficient documentation

## 2014-09-09 DIAGNOSIS — G8929 Other chronic pain: Secondary | ICD-10-CM | POA: Insufficient documentation

## 2014-09-09 LAB — CBC WITH DIFFERENTIAL/PLATELET
BASOS PCT: 1 % (ref 0–1)
Basophils Absolute: 0 10*3/uL (ref 0.0–0.1)
EOS PCT: 2 % (ref 0–5)
Eosinophils Absolute: 0.2 10*3/uL (ref 0.0–0.7)
HEMATOCRIT: 34.5 % — AB (ref 36.0–46.0)
Hemoglobin: 12.1 g/dL (ref 12.0–15.0)
Lymphocytes Relative: 45 % (ref 12–46)
Lymphs Abs: 3.9 10*3/uL (ref 0.7–4.0)
MCH: 28.6 pg (ref 26.0–34.0)
MCHC: 35.1 g/dL (ref 30.0–36.0)
MCV: 81.6 fL (ref 78.0–100.0)
MONO ABS: 0.8 10*3/uL (ref 0.1–1.0)
MONOS PCT: 10 % (ref 3–12)
NEUTROS ABS: 3.5 10*3/uL (ref 1.7–7.7)
Neutrophils Relative %: 42 % — ABNORMAL LOW (ref 43–77)
PLATELETS: 213 10*3/uL (ref 150–400)
RBC: 4.23 MIL/uL (ref 3.87–5.11)
RDW: 12.6 % (ref 11.5–15.5)
WBC: 8.5 10*3/uL (ref 4.0–10.5)

## 2014-09-09 LAB — URINALYSIS, ROUTINE W REFLEX MICROSCOPIC
Bilirubin Urine: NEGATIVE
Glucose, UA: NEGATIVE mg/dL
Ketones, ur: 15 mg/dL — AB
LEUKOCYTES UA: NEGATIVE
Nitrite: NEGATIVE
PH: 7 (ref 5.0–8.0)
Protein, ur: NEGATIVE mg/dL
SPECIFIC GRAVITY, URINE: 1.025 (ref 1.005–1.030)
Urobilinogen, UA: 1 mg/dL (ref 0.0–1.0)

## 2014-09-09 LAB — COMPREHENSIVE METABOLIC PANEL
ALK PHOS: 32 U/L — AB (ref 38–126)
ALT: 10 U/L — ABNORMAL LOW (ref 14–54)
ANION GAP: 7 (ref 5–15)
AST: 14 U/L — AB (ref 15–41)
Albumin: 3.9 g/dL (ref 3.5–5.0)
BUN: 6 mg/dL (ref 6–20)
CO2: 25 mmol/L (ref 22–32)
Calcium: 8.6 mg/dL — ABNORMAL LOW (ref 8.9–10.3)
Chloride: 109 mmol/L (ref 101–111)
Creatinine, Ser: 0.56 mg/dL (ref 0.44–1.00)
GFR calc Af Amer: >60 mL/min (ref >60)
GFR calc non Af Amer: >60 mL/min (ref >60)
Glucose, Bld: 89 mg/dL (ref 65–99)
Potassium: 3.3 mmol/L — ABNORMAL LOW (ref 3.5–5.1)
SODIUM: 141 mmol/L (ref 135–145)
Total Bilirubin: 0.5 mg/dL (ref 0.3–1.2)
Total Protein: 6.9 g/dL (ref 6.5–8.1)

## 2014-09-09 LAB — URINE MICROSCOPIC-ADD ON

## 2014-09-09 LAB — RETICULOCYTES
RBC.: 4.23 MIL/uL (ref 3.87–5.11)
Retic Count, Absolute: 67.7 10*3/uL (ref 19.0–186.0)
Retic Ct Pct: 1.6 % (ref 0.4–3.1)

## 2014-09-09 LAB — PREGNANCY, URINE: Preg Test, Ur: NEGATIVE

## 2014-09-09 MED ORDER — MORPHINE SULFATE (PF) 4 MG/ML IV SOLN
4.0000 mg | Freq: Once | INTRAVENOUS | Status: DC
Start: 2014-09-09 — End: 2014-09-09

## 2014-09-09 MED ORDER — CYCLOBENZAPRINE HCL 10 MG PO TABS
10.0000 mg | ORAL_TABLET | Freq: Once | ORAL | Status: AC
  Administered 2014-09-09: 10 mg via ORAL
  Filled 2014-09-09: qty 1

## 2014-09-09 MED ORDER — METHOCARBAMOL 500 MG PO TABS: 500.0000 mg | ORAL_TABLET | Freq: Two times a day (BID) | ORAL | Status: DC

## 2014-09-09 MED ORDER — DIPHENHYDRAMINE HCL 50 MG/ML IJ SOLN
25.0000 mg | Freq: Once | INTRAMUSCULAR | Status: AC
  Administered 2014-09-09: 25 mg via INTRAVENOUS
  Filled 2014-09-09: qty 1

## 2014-09-09 MED ORDER — DIAZEPAM 5 MG PO TABS: 5.0000 mg | ORAL_TABLET | Freq: Every evening | ORAL | Status: DC | PRN

## 2014-09-09 MED ORDER — ONDANSETRON HCL 4 MG/2ML IJ SOLN
4.0000 mg | Freq: Once | INTRAMUSCULAR | Status: AC
  Administered 2014-09-09: 4 mg via INTRAVENOUS
  Filled 2014-09-09: qty 2

## 2014-09-09 MED ORDER — SODIUM CHLORIDE 0.9 % IV BOLUS (SEPSIS)
1000.0000 mL | Freq: Once | INTRAVENOUS | Status: AC
  Administered 2014-09-09: 1000 mL via INTRAVENOUS

## 2014-09-09 MED ORDER — MORPHINE SULFATE (PF) 4 MG/ML IV SOLN
4.0000 mg | INTRAVENOUS | Status: DC | PRN
  Administered 2014-09-09 (×2): 4 mg via INTRAVENOUS
  Filled 2014-09-09 (×2): qty 1

## 2014-09-09 NOTE — Discharge Instructions (Signed)
Muscle relaxants as prescribed.   Return here with any worsening symptoms.   Primary care follow-up.

## 2014-09-09 NOTE — ED Notes (Signed)
Pt alert & oriented x4, stable gait. Patient given discharge instructions, paperwork & prescription(s). Patient informed not to drive, operate any equipment & handel any important documents 4 hours after taking pain medication. Patient  instructed to stop at the registration desk to finish any additional paperwork. Patient  verbalized understanding. Pt left department w/ no further questions. 

## 2014-09-09 NOTE — ED Notes (Signed)
Pt asked again to attempt to get a urine sample.

## 2014-09-09 NOTE — ED Notes (Signed)
Pt comes in with generalized head and back pain that she describes at burning. pt states that she has sickle cell and this feels same as previous crisis. Pt has had nausea and vomiting for since yesterday. NAD noted.

## 2014-09-09 NOTE — ED Provider Notes (Signed)
CSN: [id number]     Arrival date & time 09/09/14  1728 History   First MD Initiated Contact with Patient 09/09/14 1837     Chief Complaint  Patient presents with  . Sickle Cell Pain Crisis      HPI  Patient presents for evaluation of back pain and "burning neck pain".  Patient reports a history of chronic abdominal pain. Sickle cell disease, and fibromyalgia. States "I can't tell which this is". States her stimulus sickle cell pain is back pain. Fibromyalgia tends to be more burning pain in my arms and legs". States she has some similar burning pain in her back and neck. No fevers or chills. No neck stiffness. Mild nausea now, but multiple episodes of vomiting through the day. No fevers.  Past Medical History  Diagnosis Date  . Hypertension   . Migraine   . Asthma   . Pelvic inflammatory disease (PID)   . Sickle cell disease   . Fibromyalgia   . Chronic abdominal pain    Past Surgical History  Procedure Laterality Date  . Tubal ligation    . Kidney infections    . Wisdom tooth extraction     Family History  Problem Relation Age of Onset  . Hypertension Mother   . Diabetes Mother   . Heart failure Father    Social History  Substance Use Topics  . Smoking status: Light Tobacco Smoker    Types: Cigarettes  . Smokeless tobacco: Never Used     Comment: Occasional smoker  . Alcohol Use: Yes     Comment: occasionally-once a month   OB History    Gravida Para Term Preterm AB TAB SAB Ectopic Multiple Living   4 4 1 3      4      Review of Systems  Constitutional: Negative for fever, chills, diaphoresis, appetite change and fatigue.  HENT: Negative for mouth sores, sore throat and trouble swallowing.   Eyes: Negative for visual disturbance.  Respiratory: Negative for cough, chest tightness, shortness of breath and wheezing.   Cardiovascular: Negative for chest pain.  Gastrointestinal: Negative for nausea, vomiting, abdominal pain, diarrhea and abdominal distention.    Endocrine: Negative for polydipsia, polyphagia and polyuria.  Genitourinary: Negative for dysuria, frequency and hematuria.  Musculoskeletal: Positive for back pain and neck pain. Negative for gait problem.  Skin: Negative for color change, pallor and rash.  Neurological: Negative for dizziness, syncope, light-headedness and headaches.  Hematological: Does not bruise/bleed easily.  Psychiatric/Behavioral: Negative for behavioral problems and confusion.      Allergies  Tomato; Aspirin; Doxycycline; Hydromorphone hcl; Ibuprofen; Metoclopramide; Other; Shellfish allergy; Toradol; and Latex  Home Medications   Prior to Admission medications   Medication Sig Start Date End Date Taking? Authorizing Provider  acetaminophen (TYLENOL) 500 MG tablet Take 500 mg by mouth every 6 (six) hours as needed for pain.     Historical Provider, MD  albuterol (PROVENTIL HFA;VENTOLIN HFA) 108 (90 BASE) MCG/ACT inhaler Inhale 2 puffs into the lungs every 6 (six) hours as needed for wheezing or shortness of breath. 01/14/12   01/16/12, MD  cyclobenzaprine (FLEXERIL) 10 MG tablet Take 1 tablet (10 mg total) by mouth 3 (three) times daily as needed for muscle spasms. 07/29/14   09/29/14, MD  diazepam (VALIUM) 5 MG tablet Take 1 tablet (5 mg total) by mouth at bedtime as needed for muscle spasms (sleep). 09/09/14   09/11/14, MD  methocarbamol (ROBAXIN) 500 MG tablet Take 1 tablet (  500 mg total) by mouth 2 (two) times daily. 09/09/14   Rolland Porter, MD  promethazine (PHENERGAN) 25 MG tablet Take 1 tablet (25 mg total) by mouth every 6 (six) hours as needed for nausea or vomiting. Patient not taking: Reported on 09/09/2014 08/11/14   Glynn Octave, MD   BP 135/88 mmHg  Pulse 65  Temp(Src) 98.1 F (36.7 C) (Oral)  Resp 18  Ht 5' (1.524 m)  Wt 170 lb (77.111 kg)  BMI 33.20 kg/m2  SpO2 100%  LMP 09/01/2014 Physical Exam  Constitutional: She is oriented to person, place, and time. She appears well-developed  and well-nourished. No distress.  HENT:  Head: Normocephalic.  Eyes: Conjunctivae are normal. Pupils are equal, round, and reactive to light. No scleral icterus.  Neck: Normal range of motion. Neck supple. No thyromegaly present.  Cardiovascular: Normal rate and regular rhythm.  Exam reveals no gallop and no friction rub.   No murmur heard. Pulmonary/Chest: Effort normal and breath sounds normal. No respiratory distress. She has no wheezes. She has no rales.  Clear bilateral breath sounds.  Abdominal: Soft. Bowel sounds are normal. She exhibits no distension. There is no tenderness. There is no rebound.  Musculoskeletal: Normal range of motion.       Back:  Neurological: She is alert and oriented to person, place, and time.  Skin: Skin is warm and dry. No rash noted.  Psychiatric: She has a normal mood and affect. Her behavior is normal.    ED Course  Procedures (including critical care time) Labs Review Labs Reviewed  COMPREHENSIVE METABOLIC PANEL - Abnormal; Notable for the following:    Potassium 3.3 (*)    Calcium 8.6 (*)    AST 14 (*)    ALT 10 (*)    Alkaline Phosphatase 32 (*)    All other components within normal limits  CBC WITH DIFFERENTIAL/PLATELET - Abnormal; Notable for the following:    HCT 34.5 (*)    Neutrophils Relative % 42 (*)    All other components within normal limits  URINALYSIS, ROUTINE W REFLEX MICROSCOPIC (NOT AT [facility]) - Abnormal; Notable for the following:    Hgb urine dipstick TRACE (*)    Ketones, ur 15 (*)    All other components within normal limits  URINE MICROSCOPIC-ADD ON - Abnormal; Notable for the following:    Squamous Epithelial / LPF FEW (*)    All other components within normal limits  RETICULOCYTES  PREGNANCY, URINE    Imaging Review No results found. I have personally reviewed and evaluated these images and lab results as part of my medical decision-making.   EKG Interpretation None      MDM   Final diagnoses:  Pain     Reassuring studies.  Question history of SCA per chart.  Pain today seems muscular.  Will treat symptoms.     Rolland Porter, MD 09/09/14 2136

## 2014-10-08 ENCOUNTER — Inpatient Hospital Stay (HOSPITAL_COMMUNITY)
Admission: EM | Admit: 2014-10-08 | Discharge: 2014-10-16 | DRG: 641 | Disposition: A | Discharge status: Home / Self Care | Payer: Medicaid Other | Attending: Internal Medicine | Admitting: Internal Medicine

## 2014-10-08 ENCOUNTER — Encounter (HOSPITAL_COMMUNITY): Payer: Self-pay | Admitting: Emergency Medicine

## 2014-10-08 DIAGNOSIS — R112 Nausea with vomiting, unspecified: Secondary | ICD-10-CM | POA: Diagnosis present

## 2014-10-08 DIAGNOSIS — G43A0 Cyclical vomiting, not intractable: Secondary | ICD-10-CM

## 2014-10-08 DIAGNOSIS — G629 Polyneuropathy, unspecified: Secondary | ICD-10-CM | POA: Diagnosis present

## 2014-10-08 DIAGNOSIS — G894 Chronic pain syndrome: Secondary | ICD-10-CM | POA: Diagnosis present

## 2014-10-08 DIAGNOSIS — Z79899 Other long term (current) drug therapy: Secondary | ICD-10-CM

## 2014-10-08 DIAGNOSIS — G43709 Chronic migraine without aura, not intractable, without status migrainosus: Secondary | ICD-10-CM | POA: Diagnosis present

## 2014-10-08 DIAGNOSIS — E876 Hypokalemia: Secondary | ICD-10-CM | POA: Diagnosis present

## 2014-10-08 DIAGNOSIS — R1084 Generalized abdominal pain: Secondary | ICD-10-CM | POA: Diagnosis not present

## 2014-10-08 DIAGNOSIS — G43909 Migraine, unspecified, not intractable, without status migrainosus: Secondary | ICD-10-CM | POA: Diagnosis present

## 2014-10-08 DIAGNOSIS — Z91013 Allergy to seafood: Secondary | ICD-10-CM

## 2014-10-08 DIAGNOSIS — Z8249 Family history of ischemic heart disease and other diseases of the circulatory system: Secondary | ICD-10-CM

## 2014-10-08 DIAGNOSIS — E538 Deficiency of other specified B group vitamins: Principal | ICD-10-CM | POA: Diagnosis present

## 2014-10-08 DIAGNOSIS — N739 Female pelvic inflammatory disease, unspecified: Secondary | ICD-10-CM | POA: Diagnosis present

## 2014-10-08 DIAGNOSIS — Z885 Allergy status to narcotic agent status: Secondary | ICD-10-CM

## 2014-10-08 DIAGNOSIS — R29898 Other symptoms and signs involving the musculoskeletal system: Secondary | ICD-10-CM | POA: Diagnosis present

## 2014-10-08 DIAGNOSIS — M797 Fibromyalgia: Secondary | ICD-10-CM | POA: Diagnosis present

## 2014-10-08 DIAGNOSIS — Z91018 Allergy to other foods: Secondary | ICD-10-CM

## 2014-10-08 DIAGNOSIS — I10 Essential (primary) hypertension: Secondary | ICD-10-CM | POA: Diagnosis present

## 2014-10-08 DIAGNOSIS — F17210 Nicotine dependence, cigarettes, uncomplicated: Secondary | ICD-10-CM | POA: Diagnosis present

## 2014-10-08 DIAGNOSIS — Z91040 Latex allergy status: Secondary | ICD-10-CM

## 2014-10-08 DIAGNOSIS — F411 Generalized anxiety disorder: Secondary | ICD-10-CM | POA: Diagnosis present

## 2014-10-08 DIAGNOSIS — J45909 Unspecified asthma, uncomplicated: Secondary | ICD-10-CM | POA: Diagnosis present

## 2014-10-08 DIAGNOSIS — Z886 Allergy status to analgesic agent status: Secondary | ICD-10-CM

## 2014-10-08 DIAGNOSIS — D573 Sickle-cell trait: Secondary | ICD-10-CM | POA: Diagnosis present

## 2014-10-08 DIAGNOSIS — G43919 Migraine, unspecified, intractable, without status migrainosus: Secondary | ICD-10-CM | POA: Diagnosis present

## 2014-10-08 DIAGNOSIS — R52 Pain, unspecified: Secondary | ICD-10-CM

## 2014-10-08 DIAGNOSIS — R1110 Vomiting, unspecified: Secondary | ICD-10-CM | POA: Diagnosis present

## 2014-10-08 DIAGNOSIS — R109 Unspecified abdominal pain: Secondary | ICD-10-CM | POA: Diagnosis present

## 2014-10-08 DIAGNOSIS — Z7289 Other problems related to lifestyle: Secondary | ICD-10-CM

## 2014-10-08 DIAGNOSIS — Z881 Allergy status to other antibiotic agents status: Secondary | ICD-10-CM

## 2014-10-08 LAB — CBC WITH DIFFERENTIAL/PLATELET
BASOS ABS: 0 10*3/uL (ref 0.0–0.1)
Basophils Relative: 0 %
EOS ABS: 0.3 10*3/uL (ref 0.0–0.7)
EOS PCT: 3 %
HCT: 36.7 % (ref 36.0–46.0)
HEMOGLOBIN: 13 g/dL (ref 12.0–15.0)
Lymphocytes Relative: 43 %
Lymphs Abs: 3.5 10*3/uL (ref 0.7–4.0)
MCH: 28.9 pg (ref 26.0–34.0)
MCHC: 35.4 g/dL (ref 30.0–36.0)
MCV: 81.6 fL (ref 78.0–100.0)
Monocytes Absolute: 0.8 10*3/uL (ref 0.1–1.0)
Monocytes Relative: 9 %
NEUTROS PCT: 45 %
Neutro Abs: 3.7 10*3/uL (ref 1.7–7.7)
Platelets: 234 10*3/uL (ref 150–400)
RBC: 4.5 MIL/uL (ref 3.87–5.11)
RDW: 13.6 % (ref 11.5–15.5)
WBC: 8.2 10*3/uL (ref 4.0–10.5)

## 2014-10-08 LAB — COMPREHENSIVE METABOLIC PANEL
ALBUMIN: 4.2 g/dL (ref 3.5–5.0)
ALK PHOS: 39 U/L (ref 38–126)
ALT: 9 U/L — AB (ref 14–54)
AST: 16 U/L (ref 15–41)
Anion gap: 6 (ref 5–15)
BUN: 7 mg/dL (ref 6–20)
CALCIUM: 8.8 mg/dL — AB (ref 8.9–10.3)
CHLORIDE: 107 mmol/L (ref 101–111)
CO2: 25 mmol/L (ref 22–32)
CREATININE: 0.55 mg/dL (ref 0.44–1.00)
GFR calc Af Amer: >60 mL/min (ref >60)
GFR calc non Af Amer: >60 mL/min (ref >60)
GLUCOSE: 94 mg/dL (ref 65–99)
Potassium: 3.5 mmol/L (ref 3.5–5.1)
SODIUM: 138 mmol/L (ref 135–145)
Total Bilirubin: 0.6 mg/dL (ref 0.3–1.2)
Total Protein: 7.6 g/dL (ref 6.5–8.1)

## 2014-10-08 LAB — LIPASE, BLOOD: Lipase: 23 U/L (ref 22–51)

## 2014-10-08 LAB — PROTIME-INR
INR: 1.05 (ref 0.00–1.49)
Prothrombin Time: 13.9 seconds (ref 11.6–15.2)

## 2014-10-08 LAB — RETICULOCYTES
RBC.: 4.5 MIL/uL (ref 3.87–5.11)
RETIC COUNT ABSOLUTE: 58.5 10*3/uL (ref 19.0–186.0)
Retic Ct Pct: 1.3 % (ref 0.4–3.1)

## 2014-10-08 MED ORDER — HYDROMORPHONE HCL 2 MG/ML IJ SOLN: 0.0250 mg/kg | INTRAMUSCULAR | Status: AC

## 2014-10-08 MED ORDER — HYDROMORPHONE HCL 2 MG/ML IJ SOLN: 0.0313 mg/kg | INTRAMUSCULAR | Status: DC

## 2014-10-08 MED ORDER — METHOCARBAMOL 500 MG PO TABS
500.0000 mg | ORAL_TABLET | Freq: Two times a day (BID) | ORAL | Status: DC
  Administered 2014-10-08 – 2014-10-11 (×7): 500 mg via ORAL
  Filled 2014-10-08 (×8): qty 1

## 2014-10-08 MED ORDER — ONDANSETRON HCL 4 MG/2ML IJ SOLN
4.0000 mg | Freq: Four times a day (QID) | INTRAMUSCULAR | Status: DC | PRN
  Administered 2014-10-09 (×2): 4 mg via INTRAVENOUS
  Filled 2014-10-08 (×5): qty 2

## 2014-10-08 MED ORDER — DIPHENHYDRAMINE HCL 50 MG/ML IJ SOLN
25.0000 mg | Freq: Once | INTRAMUSCULAR | Status: AC
  Administered 2014-10-08: 25 mg via INTRAVENOUS
  Filled 2014-10-08: qty 1

## 2014-10-08 MED ORDER — ACETAMINOPHEN 650 MG RE SUPP: 650.0000 mg | Freq: Four times a day (QID) | RECTAL | Status: DC | PRN

## 2014-10-08 MED ORDER — PROMETHAZINE HCL 25 MG PO TABS
25.0000 mg | ORAL_TABLET | Freq: Four times a day (QID) | ORAL | Status: DC | PRN
  Administered 2014-10-09 – 2014-10-12 (×6): 25 mg via ORAL
  Filled 2014-10-08 (×2): qty 1
  Filled 2014-10-08 (×6): qty 2

## 2014-10-08 MED ORDER — DIPHENHYDRAMINE HCL 25 MG PO CAPS: 25.0000 mg | ORAL_CAPSULE | ORAL | Status: DC | PRN

## 2014-10-08 MED ORDER — LORAZEPAM 1 MG PO TABS: 1.0000 mg | ORAL_TABLET | Freq: Once | ORAL | Status: DC

## 2014-10-08 MED ORDER — ENOXAPARIN SODIUM 40 MG/0.4ML ~~LOC~~ SOLN
40.0000 mg | SUBCUTANEOUS | Status: DC
  Administered 2014-10-08 – 2014-10-15 (×8): 40 mg via SUBCUTANEOUS
  Filled 2014-10-08 (×8): qty 0.4

## 2014-10-08 MED ORDER — HYDROMORPHONE HCL 2 MG/ML IJ SOLN
0.0313 mg/kg | INTRAMUSCULAR | Status: AC
  Administered 2014-10-08: 2.3 mg via INTRAVENOUS
  Filled 2014-10-08: qty 2

## 2014-10-08 MED ORDER — HYDROMORPHONE HCL 2 MG/ML IJ SOLN
0.0250 mg/kg | INTRAMUSCULAR | Status: AC
  Administered 2014-10-08: 1.9 mg via INTRAVENOUS
  Filled 2014-10-08: qty 1

## 2014-10-08 MED ORDER — LORAZEPAM 2 MG/ML IJ SOLN
0.5000 mg | Freq: Once | INTRAMUSCULAR | Status: AC
  Administered 2014-10-08: 0.5 mg via INTRAVENOUS
  Filled 2014-10-08: qty 1

## 2014-10-08 MED ORDER — ALBUTEROL SULFATE (2.5 MG/3ML) 0.083% IN NEBU: 3.0000 mL | INHALATION_SOLUTION | Freq: Four times a day (QID) | RESPIRATORY_TRACT | Status: DC | PRN

## 2014-10-08 MED ORDER — HYDROMORPHONE HCL 2 MG/ML IJ SOLN
0.0375 mg/kg | INTRAMUSCULAR | Status: AC
  Administered 2014-10-08: 1.9 mg via INTRAVENOUS
  Filled 2014-10-08: qty 1

## 2014-10-08 MED ORDER — HYDROMORPHONE HCL 2 MG/ML IJ SOLN: 0.0375 mg/kg | INTRAMUSCULAR | Status: AC

## 2014-10-08 MED ORDER — SODIUM CHLORIDE 0.9 % IV SOLN
25.0000 mg | Freq: Four times a day (QID) | INTRAVENOUS | Status: DC | PRN
  Filled 2014-10-08: qty 0.5

## 2014-10-08 MED ORDER — HYDROMORPHONE HCL 1 MG/ML IJ SOLN
2.0000 mg | INTRAMUSCULAR | Status: DC | PRN
  Administered 2014-10-09 (×2): 2 mg via INTRAVENOUS
  Filled 2014-10-08 (×2): qty 2

## 2014-10-08 MED ORDER — ACETAMINOPHEN 325 MG PO TABS
650.0000 mg | ORAL_TABLET | Freq: Four times a day (QID) | ORAL | Status: DC | PRN
  Administered 2014-10-09 (×2): 650 mg via ORAL
  Filled 2014-10-08 (×2): qty 2

## 2014-10-08 MED ORDER — SODIUM CHLORIDE 0.45 % IV SOLN
INTRAVENOUS | Status: DC
  Administered 2014-10-08: 19:00:00 via INTRAVENOUS
  Administered 2014-10-09: 1 mL via INTRAVENOUS
  Administered 2014-10-10 – 2014-10-13 (×5): via INTRAVENOUS
  Administered 2014-10-13: 1 mL via INTRAVENOUS
  Administered 2014-10-14: 1000 mL via INTRAVENOUS

## 2014-10-08 MED ORDER — ONDANSETRON HCL 4 MG PO TABS: 4.0000 mg | ORAL_TABLET | Freq: Four times a day (QID) | ORAL | Status: DC | PRN

## 2014-10-08 MED ORDER — ONDANSETRON HCL 4 MG/2ML IJ SOLN
4.0000 mg | INTRAMUSCULAR | Status: DC | PRN
  Administered 2014-10-08 – 2014-10-12 (×7): 4 mg via INTRAVENOUS
  Filled 2014-10-08 (×4): qty 2

## 2014-10-08 MED ORDER — PROMETHAZINE HCL 25 MG/ML IJ SOLN
12.5000 mg | Freq: Once | INTRAMUSCULAR | Status: AC
  Administered 2014-10-08: 12.5 mg via INTRAVENOUS
  Filled 2014-10-08: qty 1

## 2014-10-08 NOTE — H&P (Addendum)
PCP:   Default, Provider, MD   Chief Complaint:  Nausea vomiting and generalized pain  HPI:  29 year old female who  has a past medical history of Hypertension; Migraine; Asthma; Pelvic inflammatory disease (PID); Sickle cell disease; Fibromyalgia; and Chronic abdominal pain. Today came to the ED with chief complaint of nausea vomiting and generalized body pain for past 3 days. Patient says that she was diagnosed with sickle cell anemia at [facility], though she does not have any confirmation of that. She also has history of fibromyalgia, chronic pain syndrome and has been on chronic pain medications at home. Patient says that she ran out of medications on Friday and since then she started having generalized pain and nausea vomiting for past 2 days. She denies chest pain or shortness of breath. Complains of abdominal pain. She does have history of pelvic inflammatory disease. Denies fever, no dysuria. Complains of headache.  Allergies:   Allergies  Allergen Reactions  . Tomato Shortness Of Breath, Swelling and Rash  . Aspirin Hives  . Doxycycline Nausea And Vomiting  . Hydromorphone Hcl Itching    Patient can take but has to have benadryl in addition to  . Ibuprofen Hives  . Metoclopramide Other (See Comments)    "makes me feel jittery"  . Other Hives    [place]  . Shellfish Allergy Hives  . Toradol [Ketorolac Tromethamine] Hives  . Latex Itching and Rash    Vaginal Only      Past Medical History  Diagnosis Date  . Hypertension   . Migraine   . Asthma   . Pelvic inflammatory disease (PID)   . Sickle cell disease   . Fibromyalgia   . Chronic abdominal pain     Past Surgical History  Procedure Laterality Date  . Tubal ligation    . Kidney infections    . Wisdom tooth extraction      Prior to Admission medications   Medication Sig Start Date End Date Taking? Authorizing Provider  acetaminophen (TYLENOL) 500 MG tablet Take 500 mg by mouth every 6 (six) hours  as needed for pain.     Historical Provider, MD  albuterol (PROVENTIL HFA;VENTOLIN HFA) 108 (90 BASE) MCG/ACT inhaler Inhale 2 puffs into the lungs every 6 (six) hours as needed for wheezing or shortness of breath. 01/14/12   Consuello Masse, MD  cyclobenzaprine (FLEXERIL) 10 MG tablet Take 1 tablet (10 mg total) by mouth 3 (three) times daily as needed for muscle spasms. Patient not taking: Reported on 10/08/2014 07/29/14   Devoria Albe, MD  diazepam (VALIUM) 5 MG tablet Take 1 tablet (5 mg total) by mouth at bedtime as needed for muscle spasms (sleep). Patient not taking: Reported on 10/08/2014 09/09/14   Rolland Porter, MD  methocarbamol (ROBAXIN) 500 MG tablet Take 1 tablet (500 mg total) by mouth 2 (two) times daily. Patient not taking: Reported on 10/08/2014 09/09/14   Rolland Porter, MD  promethazine (PHENERGAN) 25 MG tablet Take 1 tablet (25 mg total) by mouth every 6 (six) hours as needed for nausea or vomiting. Patient not taking: Reported on 09/09/2014 08/11/14   Glynn Octave, MD    Social History:  reports that she has been smoking Cigarettes.  She has never used smokeless tobacco. She reports that she drinks alcohol. She reports that she does not use illicit drugs.  Family History  Problem Relation Age of Onset  . Hypertension Mother   . Diabetes Mother   . Heart failure Father  Filed Weights   10/08/14 1734 10/08/14 2024  Weight: 74.844 kg (165 lb) 50.531 kg (111 lb 6.4 oz)    All the positives are listed in BOLD  Review of Systems:  HEENT: Headache, blurred vision, runny nose, sore throat Neck: Hypothyroidism, hyperthyroidism,,lymphadenopathy Chest : Shortness of breath, history of COPD, Asthma Heart : Chest pain, history of coronary arterey disease GI:  Nausea, vomiting, diarrhea, constipation, GERD GU: Dysuria, urgency, frequency of urination, hematuria Neuro: Stroke, seizures, syncope Psych: Depression, anxiety, hallucinations   Physical Exam: Blood pressure 121/67, pulse  118, temperature 98.1 F (36.7 C), temperature source Oral, resp. rate 21, height 5' (1.524 m), weight 50.531 kg (111 lb 6.4 oz), last menstrual period 09/01/2014, SpO2 99 %. Constitutional:   Patient is a well-developed and well-nourished female* in no acute distress and cooperative with exam. Head: Normocephalic and atraumatic Mouth: Mucus membranes moist Eyes: PERRL, EOMI, conjunctivae normal Neck: Supple, No Thyromegaly Cardiovascular: RRR, S1 normal, S2 normal Pulmonary/Chest: CTAB, no wheezes, rales, or rhonchi Abdominal: Soft. Tender to palpation, non-distended, bowel sounds are normal, no masses, organomegaly, or guarding present.  Neurological: A&O x3, Strength is normal and symmetric bilaterally, cranial nerve II-XII are grossly intact, no focal motor deficit, sensory intact to light touch bilaterally.  Extremities : No Cyanosis, Clubbing or Edema  Labs on Admission:  Basic Metabolic Panel:  Recent Labs Lab 10/08/14 1906  NA 138  K 3.5  CL 107  CO2 25  GLUCOSE 94  BUN 7  CREATININE 0.55  CALCIUM 8.8*   Liver Function Tests:  Recent Labs Lab 10/08/14 1906  AST 16  ALT 9*  ALKPHOS 39  BILITOT 0.6  PROT 7.6  ALBUMIN 4.2    Recent Labs Lab 10/08/14 1906  LIPASE 23   No results for input(s): AMMONIA in the last 168 hours. CBC:  Recent Labs Lab 10/08/14 1906  WBC 8.2  NEUTROABS 3.7  HGB 13.0  HCT 36.7  MCV 81.6  PLT 234      Assessment/Plan Active Problems:   Abdominal pain   Vomiting   Nausea & vomiting  Nausea and vomiting We'll admit the patient under observation, keep nothing by mouth. IV fluids. Antiemetics, Zofran when necessary  Acute on chronic pain syndrome  start patient on Dilaudid 2 mg IV every 4 hours when necessary.  ? Sickle cell disease Lab work for hemoglobinopathy has been sent in the ED Follow the results.  DVT prophylaxis Lovenox  Code status: Full code  Family discussion: No family at bedside   Time Spent  on Admission: 55 minutes  LAMA,GAGAN S [facility] Pager: [phone] 10/08/2014, 10:07 PM  If 7PM-7AM, please contact night-coverage  [email or web address]  Password [email or web address]

## 2014-10-08 NOTE — ED Notes (Signed)
Pt complaining pain in legs and back, hx sickle cell and fibromyalgia

## 2014-10-08 NOTE — Progress Notes (Addendum)
Pt was complaining about feeling anxious and asked could she have anything for her nerves. Spoke with Dr.Schorr who ordered for pt to received 0.5 mg ativan IV. Will continue to monitor

## 2014-10-08 NOTE — ED Provider Notes (Signed)
CSN: [id number]     Arrival date & time 10/08/14  1725 History   First MD Initiated Contact with Patient 10/08/14 1759     Chief Complaint  Patient presents with  . Sickle Cell Pain Crisis    Patient is a 29 y.o. female presenting with sickle cell pain.  Sickle Cell Pain Crisis Location:  Back and lower extremity Severity:  Severe Onset quality:  Gradual Duration:  1 week Timing:  Constant Progression:  Worsening Chronicity:  Recurrent Context: not change in medication and not non-compliance   Relieved by:  Nothing Associated symptoms: headaches and vomiting   Associated symptoms: no chest pain, no cough, no fever and no nausea   Pt moved to this area again.  She does not have any transportation.  She does not have a primary care doctor.  She ran out of her medications and could not handle the pain anymore.  Past Medical History  Diagnosis Date  . Hypertension   . Migraine   . Asthma   . Pelvic inflammatory disease (PID)   . Sickle cell disease   . Fibromyalgia   . Chronic abdominal pain    Past Surgical History  Procedure Laterality Date  . Tubal ligation    . Kidney infections    . Wisdom tooth extraction     Family History  Problem Relation Age of Onset  . Hypertension Mother   . Diabetes Mother   . Heart failure Father    Social History  Substance Use Topics  . Smoking status: Light Tobacco Smoker    Types: Cigarettes  . Smokeless tobacco: Never Used     Comment: Occasional smoker  . Alcohol Use: Yes     Comment: occasionally-once a month   OB History    Gravida Para Term Preterm AB TAB SAB Ectopic Multiple Living   4 4 1 3      4      Review of Systems  Constitutional: Negative for fever.  Respiratory: Negative for cough.   Cardiovascular: Negative for chest pain.  Gastrointestinal: Positive for vomiting and abdominal pain. Negative for nausea.  Neurological: Positive for headaches.  All other systems reviewed and are negative.     Allergies   Tomato; Aspirin; Doxycycline; Hydromorphone hcl; Ibuprofen; Metoclopramide; Other; Shellfish allergy; Toradol; and Latex  Home Medications   Prior to Admission medications   Medication Sig Start Date End Date Taking? Authorizing Jamela Cumbo  acetaminophen (TYLENOL) 500 MG tablet Take 500 mg by mouth every 6 (six) hours as needed for pain.     Historical Kendarius Vigen, MD  albuterol (PROVENTIL HFA;VENTOLIN HFA) 108 (90 BASE) MCG/ACT inhaler Inhale 2 puffs into the lungs every 6 (six) hours as needed for wheezing or shortness of breath. 01/14/12   01/16/12, MD  cyclobenzaprine (FLEXERIL) 10 MG tablet Take 1 tablet (10 mg total) by mouth 3 (three) times daily as needed for muscle spasms. Patient not taking: Reported on 10/08/2014 07/29/14   09/29/14, MD  diazepam (VALIUM) 5 MG tablet Take 1 tablet (5 mg total) by mouth at bedtime as needed for muscle spasms (sleep). Patient not taking: Reported on 10/08/2014 09/09/14   09/11/14, MD  methocarbamol (ROBAXIN) 500 MG tablet Take 1 tablet (500 mg total) by mouth 2 (two) times daily. Patient not taking: Reported on 10/08/2014 09/09/14   09/11/14, MD  promethazine (PHENERGAN) 25 MG tablet Take 1 tablet (25 mg total) by mouth every 6 (six) hours as needed for nausea or vomiting. Patient  not taking: Reported on 09/09/2014 08/11/14   Glynn Octave, MD   BP 121/67 mmHg  Pulse 76  Temp(Src) 98.1 F (36.7 C) (Oral)  Resp 18  Ht 5' (1.524 m)  Wt 111 lb 6.4 oz (50.531 kg)  BMI 21.76 kg/m2  SpO2 99%  LMP 09/01/2014 Physical Exam  Constitutional: She appears well-developed and well-nourished. No distress.  HENT:  Head: Normocephalic and atraumatic.  Right Ear: External ear normal.  Left Ear: External ear normal.  Eyes: Conjunctivae are normal. Right eye exhibits no discharge. Left eye exhibits no discharge. No scleral icterus.  Neck: Neck supple. No tracheal deviation present.  Cardiovascular: Normal rate, regular rhythm and intact distal pulses.    Pulmonary/Chest: Effort normal and breath sounds normal. No stridor. No respiratory distress. She has no wheezes. She has no rales.  Abdominal: Soft. Bowel sounds are normal. She exhibits no distension. There is tenderness in the epigastric area. There is no rebound and no guarding.  Musculoskeletal: She exhibits tenderness. She exhibits no edema.  ttp diffusely in extremities  Neurological: She is alert. She has normal strength. No cranial nerve deficit (no facial droop, extraocular movements intact, no slurred speech) or sensory deficit. She exhibits normal muscle tone. She displays no seizure activity. Coordination normal.  Skin: Skin is warm and dry. No rash noted.  Psychiatric: She has a normal mood and affect.  Nursing note and vitals reviewed.   ED Course  Procedures (including critical care time) Labs Review Labs Reviewed  COMPREHENSIVE METABOLIC PANEL - Abnormal; Notable for the following:    Calcium 8.8 (*)    ALT 9 (*)    All other components within normal limits  CBC WITH DIFFERENTIAL/PLATELET  PROTIME-INR  RETICULOCYTES  LIPASE, BLOOD  URINALYSIS, ROUTINE W REFLEX MICROSCOPIC (NOT AT [facility])  PREGNANCY, URINE  HEMOGLOBINOPATHY EVALUATION   Medications  0.45 % sodium chloride infusion ( Intravenous New Bag/Given 10/08/14 1849)  diphenhydrAMINE (BENADRYL) capsule 25-50 mg (not administered)  ondansetron (ZOFRAN) injection 4 mg (4 mg Intravenous Given 10/08/14 2040)  HYDROmorphone (DILAUDID) injection 1.9 mg (1.9 mg Intravenous Given 10/08/14 1836)    Or  HYDROmorphone (DILAUDID) injection 1.9 mg ( Subcutaneous See Alternative 10/08/14 1836)  diphenhydrAMINE (BENADRYL) injection 25 mg (25 mg Intravenous Given 10/08/14 1846)  promethazine (PHENERGAN) injection 12.5 mg (12.5 mg Intravenous Given 10/08/14 1919)  HYDROmorphone (DILAUDID) injection 2.3 mg (2.3 mg Intravenous Given 10/08/14 1957)     MDM   Final diagnoses:  Intractable vomiting with nausea, vomiting of  unspecified type  Pain   Patient is having persistent diffuse pain. She has continuous nausea and vomiting. Patient states she has sickle cell disease and was on folic acid as well as other pain medications. Patient states she ran out of all those medications. The patient is not anemic here. She does not have an elevated reticulocyte count.  I question whether she has sickle cell disease. I will send off a hemoglobinopathy panel.  Considering the patient's persistent pain and vomiting I will consult the medical service for further treatment until we can get confirmation of her hemoglobin up to panel.    Linwood Dibbles, MD 10/08/14 2101

## 2014-10-08 NOTE — ED Notes (Signed)
Patient unable to urinate. Patient started vomiting when moving her to get weight.

## 2014-10-08 NOTE — ED Notes (Signed)
Patient will not keep monitoring devices on.

## 2014-10-09 DIAGNOSIS — G43919 Migraine, unspecified, intractable, without status migrainosus: Secondary | ICD-10-CM

## 2014-10-09 DIAGNOSIS — E538 Deficiency of other specified B group vitamins: Secondary | ICD-10-CM | POA: Diagnosis present

## 2014-10-09 DIAGNOSIS — Z91040 Latex allergy status: Secondary | ICD-10-CM | POA: Diagnosis not present

## 2014-10-09 DIAGNOSIS — M797 Fibromyalgia: Secondary | ICD-10-CM | POA: Diagnosis present

## 2014-10-09 DIAGNOSIS — R109 Unspecified abdominal pain: Secondary | ICD-10-CM | POA: Diagnosis not present

## 2014-10-09 DIAGNOSIS — R112 Nausea with vomiting, unspecified: Secondary | ICD-10-CM

## 2014-10-09 DIAGNOSIS — I10 Essential (primary) hypertension: Secondary | ICD-10-CM | POA: Diagnosis present

## 2014-10-09 DIAGNOSIS — Z8249 Family history of ischemic heart disease and other diseases of the circulatory system: Secondary | ICD-10-CM | POA: Diagnosis not present

## 2014-10-09 DIAGNOSIS — G894 Chronic pain syndrome: Secondary | ICD-10-CM | POA: Diagnosis present

## 2014-10-09 DIAGNOSIS — Z79899 Other long term (current) drug therapy: Secondary | ICD-10-CM | POA: Diagnosis not present

## 2014-10-09 DIAGNOSIS — D573 Sickle-cell trait: Secondary | ICD-10-CM | POA: Diagnosis present

## 2014-10-09 DIAGNOSIS — Z91018 Allergy to other foods: Secondary | ICD-10-CM | POA: Diagnosis not present

## 2014-10-09 DIAGNOSIS — R1110 Vomiting, unspecified: Secondary | ICD-10-CM | POA: Diagnosis not present

## 2014-10-09 DIAGNOSIS — Z7289 Other problems related to lifestyle: Secondary | ICD-10-CM | POA: Diagnosis not present

## 2014-10-09 DIAGNOSIS — R52 Pain, unspecified: Secondary | ICD-10-CM | POA: Diagnosis present

## 2014-10-09 DIAGNOSIS — E876 Hypokalemia: Secondary | ICD-10-CM | POA: Diagnosis present

## 2014-10-09 DIAGNOSIS — R1084 Generalized abdominal pain: Secondary | ICD-10-CM | POA: Diagnosis not present

## 2014-10-09 DIAGNOSIS — F17210 Nicotine dependence, cigarettes, uncomplicated: Secondary | ICD-10-CM | POA: Diagnosis present

## 2014-10-09 DIAGNOSIS — G629 Polyneuropathy, unspecified: Secondary | ICD-10-CM | POA: Diagnosis present

## 2014-10-09 DIAGNOSIS — M79606 Pain in leg, unspecified: Secondary | ICD-10-CM

## 2014-10-09 DIAGNOSIS — F411 Generalized anxiety disorder: Secondary | ICD-10-CM | POA: Diagnosis present

## 2014-10-09 DIAGNOSIS — N739 Female pelvic inflammatory disease, unspecified: Secondary | ICD-10-CM | POA: Diagnosis present

## 2014-10-09 DIAGNOSIS — Z91013 Allergy to seafood: Secondary | ICD-10-CM | POA: Diagnosis not present

## 2014-10-09 DIAGNOSIS — J45909 Unspecified asthma, uncomplicated: Secondary | ICD-10-CM | POA: Diagnosis present

## 2014-10-09 DIAGNOSIS — G43709 Chronic migraine without aura, not intractable, without status migrainosus: Secondary | ICD-10-CM | POA: Diagnosis not present

## 2014-10-09 DIAGNOSIS — Z881 Allergy status to other antibiotic agents status: Secondary | ICD-10-CM | POA: Diagnosis not present

## 2014-10-09 DIAGNOSIS — Z885 Allergy status to narcotic agent status: Secondary | ICD-10-CM | POA: Diagnosis not present

## 2014-10-09 DIAGNOSIS — G43909 Migraine, unspecified, not intractable, without status migrainosus: Secondary | ICD-10-CM | POA: Diagnosis present

## 2014-10-09 DIAGNOSIS — R29898 Other symptoms and signs involving the musculoskeletal system: Secondary | ICD-10-CM | POA: Diagnosis not present

## 2014-10-09 DIAGNOSIS — Z886 Allergy status to analgesic agent status: Secondary | ICD-10-CM | POA: Diagnosis not present

## 2014-10-09 LAB — CBC
HEMATOCRIT: 36.6 % (ref 36.0–46.0)
Hemoglobin: 12.9 g/dL (ref 12.0–15.0)
MCH: 28.7 pg (ref 26.0–34.0)
MCHC: 35.2 g/dL (ref 30.0–36.0)
MCV: 81.5 fL (ref 78.0–100.0)
Platelets: 221 10*3/uL (ref 150–400)
RBC: 4.49 MIL/uL (ref 3.87–5.11)
RDW: 13.7 % (ref 11.5–15.5)
WBC: 10.2 10*3/uL (ref 4.0–10.5)

## 2014-10-09 LAB — COMPREHENSIVE METABOLIC PANEL
ALT: 10 U/L — ABNORMAL LOW (ref 14–54)
AST: 20 U/L (ref 15–41)
Albumin: 4.4 g/dL (ref 3.5–5.0)
Alkaline Phosphatase: 42 U/L (ref 38–126)
Anion gap: 7 (ref 5–15)
BILIRUBIN TOTAL: 0.6 mg/dL (ref 0.3–1.2)
BUN: 5 mg/dL — AB (ref 6–20)
CALCIUM: 8.8 mg/dL — AB (ref 8.9–10.3)
CO2: 24 mmol/L (ref 22–32)
Chloride: 104 mmol/L (ref 101–111)
Creatinine, Ser: 0.48 mg/dL (ref 0.44–1.00)
GFR calc Af Amer: >60 mL/min (ref >60)
Glucose, Bld: 104 mg/dL — ABNORMAL HIGH (ref 65–99)
POTASSIUM: 3.9 mmol/L (ref 3.5–5.1)
Sodium: 135 mmol/L (ref 135–145)
TOTAL PROTEIN: 7.9 g/dL (ref 6.5–8.1)

## 2014-10-09 LAB — TSH: TSH: 0.718 u[IU]/mL (ref 0.350–4.500)

## 2014-10-09 LAB — VITAMIN B12: Vitamin B-12: 91 pg/mL — ABNORMAL LOW (ref 180–914)

## 2014-10-09 LAB — CK: CK TOTAL: 90 U/L (ref 38–234)

## 2014-10-09 MED ORDER — PNEUMOCOCCAL VAC POLYVALENT 25 MCG/0.5ML IJ INJ
0.5000 mL | INJECTION | INTRAMUSCULAR | Status: AC
  Administered 2014-10-10: 0.5 mL via INTRAMUSCULAR
  Filled 2014-10-09: qty 0.5

## 2014-10-09 MED ORDER — GI COCKTAIL ~~LOC~~
30.0000 mL | Freq: Once | ORAL | Status: DC
  Filled 2014-10-09: qty 30

## 2014-10-09 MED ORDER — LORAZEPAM 2 MG/ML IJ SOLN
0.5000 mg | Freq: Once | INTRAMUSCULAR | Status: AC
  Administered 2014-10-09: 0.5 mg via INTRAVENOUS
  Filled 2014-10-09: qty 1

## 2014-10-09 MED ORDER — INFLUENZA VAC SPLIT QUAD 0.5 ML IM SUSY
0.5000 mL | PREFILLED_SYRINGE | INTRAMUSCULAR | Status: AC
  Administered 2014-10-10: 0.5 mL via INTRAMUSCULAR
  Filled 2014-10-09: qty 0.5

## 2014-10-09 MED ORDER — OXYCODONE HCL 5 MG PO TABS
5.0000 mg | ORAL_TABLET | ORAL | Status: DC | PRN
  Administered 2014-10-10 – 2014-10-16 (×23): 5 mg via ORAL
  Filled 2014-10-09 (×24): qty 1

## 2014-10-09 MED ORDER — OXYCODONE-ACETAMINOPHEN 5-325 MG PO TABS
1.0000 | ORAL_TABLET | ORAL | Status: DC | PRN
  Administered 2014-10-10 – 2014-10-11 (×4): 1 via ORAL
  Administered 2014-10-11: 2 via ORAL
  Administered 2014-10-11: 1 via ORAL
  Administered 2014-10-12 (×3): 2 via ORAL
  Administered 2014-10-13 (×2): 1 via ORAL
  Administered 2014-10-14: 2 via ORAL
  Administered 2014-10-14: 1 via ORAL
  Administered 2014-10-14 – 2014-10-15 (×3): 2 via ORAL
  Administered 2014-10-15 – 2014-10-16 (×3): 1 via ORAL
  Filled 2014-10-09 (×2): qty 1
  Filled 2014-10-09: qty 2
  Filled 2014-10-09: qty 1
  Filled 2014-10-09: qty 2
  Filled 2014-10-09 (×2): qty 1
  Filled 2014-10-09 (×3): qty 2
  Filled 2014-10-09: qty 1
  Filled 2014-10-09 (×2): qty 2
  Filled 2014-10-09 (×3): qty 1
  Filled 2014-10-09 (×2): qty 2
  Filled 2014-10-09: qty 1
  Filled 2014-10-09: qty 2
  Filled 2014-10-09: qty 1
  Filled 2014-10-09: qty 2

## 2014-10-09 MED ORDER — DIPHENHYDRAMINE HCL 25 MG PO CAPS
25.0000 mg | ORAL_CAPSULE | Freq: Four times a day (QID) | ORAL | Status: DC | PRN
  Administered 2014-10-09 – 2014-10-12 (×10): 25 mg via ORAL
  Filled 2014-10-09 (×11): qty 1

## 2014-10-09 MED ORDER — HYDROMORPHONE HCL 1 MG/ML IJ SOLN
1.0000 mg | INTRAMUSCULAR | Status: DC | PRN
  Administered 2014-10-09 – 2014-10-15 (×21): 1 mg via INTRAVENOUS
  Filled 2014-10-09 (×22): qty 1

## 2014-10-09 MED ORDER — BUTALBITAL-APAP-CAFFEINE 50-325-40 MG PO TABS
2.0000 | ORAL_TABLET | ORAL | Status: DC | PRN
  Administered 2014-10-09 – 2014-10-15 (×6): 2 via ORAL
  Filled 2014-10-09 (×7): qty 2

## 2014-10-09 NOTE — Progress Notes (Signed)
[facility] PROGRESS NOTE  Tanya Krause OKH:[id number] DOB: 14-Dec-1985 DOA: 10/08/2014 PCP: Default, Provider, MD  Assessment/Plan: 1. Nausea and vomiting. Etiology is not entirely clear but she may have an element of narcotic withdrawal. She ran out of her narcotics approximately 7 days ago. She is having difficulty keeping down medications. Will continue NPO. Continue IVF and Zofran.Continue pain management. Will transition to PO antiemetics when nausea and vomiting ceases. 2. Lower extremity weakness/pain. Appears to be neuropathic pain. She complains of significant weakness. Will check B12, TSH, CK and will request a neurology consultation. 3. H/o migraines. Will restart on Fioricet.  4. Anxiety. Patient takes Cymbalta at home. Diazepam noted on her prior to admission medication list. 5. Fibromyalgia. 6. Chronic pain syndrome. Patient takes Percocet as an outpatient. Will need to follow up with pain clinic.   Code Status: Full DVT Prophylaxis: Lovenox Family discussion: Dicussed plan in detail. No further concerns at this time. Disposition Plan: discharge pending approval   Consultants:    Procedures:    Antibiotics:    HPI/Subjective: Worsening weakness, tingling and burning pain in legs, below knees and at her feet that is exacerbated with standing, walking and even moving her legs. Denies any recent falls. Reports headache and back pain. She states that she is nauseous when she eats or smells food. Reports multiple episodes of vomiting and chills. No diarrhea.   Objective: Filed Vitals:   10/09/14 0400  BP: 123/86  Pulse: 90  Temp: 98 F (36.7 C)  Resp: 18    Intake/Output Summary (Last 24 hours) at 10/09/14 0814 Last data filed at 10/09/14 0700  Gross per 24 hour  Intake 1218.33 ml  Output      0 ml  Net 1218.33 ml   Filed Weights   10/08/14 1734 10/08/14 2024  Weight: 74.844 kg (165 lb) 50.531 kg (111 lb 6.4 oz)    Exam:  General:  Appears  very anxious and tearful Cardiovascular: RRR, no m/r/g. No LE edema. Respiratory: CTA bilaterally, no w/r/r. Normal respiratory effort. Abdomen: soft, diffusely tender, positive bowel sounds Musculoskeletal: Extremely tender to even minimal palpation to both lower extremities. Pedal pulses are intact. Psychiatric: Appears very anxious and tearful   Data Reviewed: Basic Metabolic Panel:  Recent Labs Lab 10/08/14 1906 10/09/14 0703  NA 138 135  K 3.5 3.9  CL 107 104  CO2 25 24  GLUCOSE 94 104*  BUN 7 5*  CREATININE 0.55 0.48  CALCIUM 8.8* 8.8*   Liver Function Tests:  Recent Labs Lab 10/08/14 1906 10/09/14 0703  AST 16 20  ALT 9* 10*  ALKPHOS 39 42  BILITOT 0.6 0.6  PROT 7.6 7.9  ALBUMIN 4.2 4.4    Recent Labs Lab 10/08/14 1906  LIPASE 23   CBC:  Recent Labs Lab 10/08/14 1906 10/09/14 0703  WBC 8.2 10.2  NEUTROABS 3.7  --   HGB 13.0 12.9  HCT 36.7 36.6  MCV 81.6 81.5  PLT 234 221   Cardiac Enzymes:  BNP (last 3 results)  ProBNP (last 3 results)  CBG:   Studies: No results found.  Scheduled Meds: . enoxaparin (LOVENOX) injection  40 mg Subcutaneous Q24H  . [START ON 10/10/2014] Influenza vac split quadrivalent PF  0.5 mL Intramuscular Tomorrow-1000  . methocarbamol  500 mg Oral BID  . [START ON 10/10/2014] pneumococcal 23 valent vaccine  0.5 mL Intramuscular Tomorrow-1000   Continuous Infusions: . sodium chloride 100 mL/hr at 10/08/14 1849    Active Problems:  Abdominal pain   Vomiting   Nausea & vomiting    Time spent: 45 minutes  By signing my name below, I, Edman Circle attest that this documentation has been prepared under the direction and in the presence of Erick Blinks, M.D.  Electronically signed: Edman Circle  10/09/2014    Erick Blinks, M.D.  [facility] Pager [phone]. If 7PM-7AM, please contact night-coverage at [email or web address], password [facility] 10/09/2014, 8:14 AM     I, Dr. Erick Blinks, personally  performed the services described in this documentaiton. All medical record entries made by the scribe were at my direction and in my presence. I have reviewed the chart and agree that the record reflects my personal performance and is accurate and complete  Erick Blinks, MD, 10/09/2014 2:27 PM

## 2014-10-09 NOTE — Care Management Note (Signed)
Case Management Note  Patient Details  Name: Tanya Krause MRN: [id number] Date of Birth: 07-18-1985  Subjective/Objective:                  Pt admitted from home with vomiting and abd pain. Pt lives with her parents and will return home at discharge. Pt is independent with ADL's.   Action/Plan: Pts Medicaid has been moved to [facility] but pt has no PCP at this time. Follow up appt scheduled with [facility]. Pt also given list of PCP in the area to call. Pt is aware of follow up appt.  Expected Discharge Date:                  Expected Discharge Plan:  Home/Self Care  In-House Referral:  NA  Discharge planning Services  Follow-up appt scheduled  Post Acute Care Choice:  NA Choice offered to:  NA  DME Arranged:    DME Agency:     HH Arranged:    HH Agency:     Status of Service:  Completed, signed off  Medicare Important Message Given:    Date Medicare IM Given:    Medicare IM give by:    Date Additional Medicare IM Given:    Additional Medicare Important Message give by:     If discussed at Long Length of Stay Meetings, dates discussed:    Additional Comments:  Cheryl Flash, RN 10/09/2014, 2:17 PM

## 2014-10-09 NOTE — Progress Notes (Signed)
Complaining of headache, NPO, refused suppository.  Tylenol 650 mg po given with sip of water.  Will continue to monitor

## 2014-10-09 NOTE — Progress Notes (Signed)
Initial visit with patient. Patient was tearful throughtout visit, Shared she had moved back with her parents with 2 of her 4 children. She stated she left her job also. She shared she was feeling very lonely and was in a lot of physical pain. We also talked about her emotional and spiritual pain related to her illness, stress of moving and her stated needs for her and her family. We discussed faith issues and how she has moved through challenges before in her life. She shared both her parents were pastors and they were also going through health challenges. Brought her healing art supplies and CareNotes. Prayed with her. Plan to follow up with her.

## 2014-10-09 NOTE — Progress Notes (Signed)
Complaining of itching on arms and back.  Requesed Benadryl, given.

## 2014-10-10 ENCOUNTER — Inpatient Hospital Stay (HOSPITAL_COMMUNITY): Payer: Medicaid Other

## 2014-10-10 DIAGNOSIS — E538 Deficiency of other specified B group vitamins: Principal | ICD-10-CM

## 2014-10-10 DIAGNOSIS — R109 Unspecified abdominal pain: Secondary | ICD-10-CM

## 2014-10-10 LAB — RAPID URINE DRUG SCREEN, HOSP PERFORMED
Amphetamines: NOT DETECTED
BARBITURATES: POSITIVE — AB
BENZODIAZEPINES: POSITIVE — AB
COCAINE: NOT DETECTED
OPIATES: POSITIVE — AB
TETRAHYDROCANNABINOL: POSITIVE — AB

## 2014-10-10 LAB — URINALYSIS, ROUTINE W REFLEX MICROSCOPIC
BILIRUBIN URINE: NEGATIVE
Glucose, UA: NEGATIVE mg/dL
KETONES UR: 15 mg/dL — AB
LEUKOCYTES UA: NEGATIVE
NITRITE: NEGATIVE
PROTEIN: NEGATIVE mg/dL
Specific Gravity, Urine: <1.005 — ABNORMAL LOW (ref 1.005–1.030)
Urobilinogen, UA: 0.2 mg/dL (ref 0.0–1.0)
pH: 7 (ref 5.0–8.0)

## 2014-10-10 LAB — URINE MICROSCOPIC-ADD ON

## 2014-10-10 LAB — PREGNANCY, URINE: Preg Test, Ur: NEGATIVE

## 2014-10-10 MED ORDER — FOLIC ACID 1 MG PO TABS
1.0000 mg | ORAL_TABLET | Freq: Every day | ORAL | Status: DC
  Administered 2014-10-10 – 2014-10-12 (×3): 1 mg via ORAL
  Filled 2014-10-10 (×3): qty 1

## 2014-10-10 MED ORDER — IOHEXOL 300 MG/ML  SOLN
100.0000 mL | Freq: Once | INTRAMUSCULAR | Status: AC | PRN
Start: 2014-10-10 — End: 2014-10-10
  Administered 2014-10-10: 100 mL via INTRAVENOUS

## 2014-10-10 MED ORDER — NICOTINE 21 MG/24HR TD PT24
21.0000 mg | MEDICATED_PATCH | Freq: Every day | TRANSDERMAL | Status: DC
  Administered 2014-10-10 – 2014-10-16 (×7): 21 mg via TRANSDERMAL
  Filled 2014-10-10 (×7): qty 1

## 2014-10-10 MED ORDER — ALPRAZOLAM 0.25 MG PO TABS
0.2500 mg | ORAL_TABLET | Freq: Two times a day (BID) | ORAL | Status: DC | PRN
  Administered 2014-10-10 – 2014-10-12 (×5): 0.25 mg via ORAL
  Filled 2014-10-10 (×5): qty 1

## 2014-10-10 MED ORDER — CYANOCOBALAMIN 1000 MCG/ML IJ SOLN
1000.0000 ug | Freq: Once | INTRAMUSCULAR | Status: AC
  Administered 2014-10-10: 1000 ug via INTRAMUSCULAR
  Filled 2014-10-10: qty 1

## 2014-10-10 MED ORDER — AMITRIPTYLINE HCL 25 MG PO TABS
25.0000 mg | ORAL_TABLET | Freq: Every day | ORAL | Status: DC
  Administered 2014-10-10 – 2014-10-15 (×6): 25 mg via ORAL
  Filled 2014-10-10 (×6): qty 1

## 2014-10-10 MED ORDER — VITAMIN B-12 1000 MCG PO TABS
5000.0000 ug | ORAL_TABLET | Freq: Every day | ORAL | Status: DC
  Administered 2014-10-10 – 2014-10-14 (×4): 5000 ug via ORAL
  Filled 2014-10-10 (×6): qty 5

## 2014-10-10 NOTE — Progress Notes (Signed)
[facility] PROGRESS NOTE  GRACEANN BOILEAU VXB:[id number] DOB: 09-09-1985 DOA: 10/08/2014 PCP: Default, Provider, MD  Assessment/Plan: 1. Nausea and vomiting. Etiology is not entirely clear but she may have an element of narcotic withdrawal. She ran out of her narcotics approximately 9/15. Continue NPO as she is still having difficulty keeping medications down. Continue IVF,  Zofran, and pain management. Will transition to PO antiemetics when nausea and vomiting ceases. Since she is still having vomiting and abdominal pain, will order abdominal CT. 2. Vitamin B12 deficiency with associated neuropathy. Will check intrinsic factor antibodies and antiparietal cell antibodies. Start on B12/Folate supplementation.  3. Lower extremity weakness/pain, likely due to B12 deficiency. B12 91, TSH WNL, CK 90. Await further input from neurology.  4. H/o migraines, continue Fioricet. 5. Anxiety. Patient takes Cymbalta at home. Diazepam noted on her prior to admission medication list. 6. Chronic pain syndrome. Patient takes Percocet as an outpatient. Will need to follow up with pain clinic.   Code Status: Full DVT Prophylaxis: Lovenox Family discussion: No family at bedside. Discussed with patient who understands and has no concerns at this time. Disposition Plan: Discharge upon improvement.    Consultants:    Procedures:    Antibiotics:    HPI/Subjective: Feels the same. Pain in her legs is unchanged. Attempted to eat last night but vomited. Still feels nauseous and has diffuse abdominal pain.   Objective: Filed Vitals:   10/09/14 2351  BP: 125/84  Pulse: 75  Temp: 99.3 F (37.4 C)  Resp: 20    Intake/Output Summary (Last 24 hours) at 10/10/14 0745 Last data filed at 10/10/14 0600  Gross per 24 hour  Intake   2300 ml  Output    300 ml  Net   2000 ml   Filed Weights   10/08/14 1734 10/08/14 2024  Weight: 74.844 kg (165 lb) 50.531 kg (111 lb 6.4 oz)     Exam:   General: Appears very anxious, NAD  Cardiovascular: mildly tachycardic, regular rhythm, no m/r/g. No LE edema.  Respiratory: CTA bilaterally, no w/r/r. Normal respiratory effort.  Abdomen: soft, diffusely tender, positive bowel sounds  Musculoskeletal: Extremely tender to even minimal palpation to both lower extremities. Pedal pulses are intact.  Psychiatric: Appears very anxious   Data Reviewed: Basic Metabolic Panel:  Recent Labs Lab 10/08/14 1906 10/09/14 0703  NA 138 135  K 3.5 3.9  CL 107 104  CO2 25 24  GLUCOSE 94 104*  BUN 7 5*  CREATININE 0.55 0.48  CALCIUM 8.8* 8.8*   Liver Function Tests:  Recent Labs Lab 10/08/14 1906 10/09/14 0703  AST 16 20  ALT 9* 10*  ALKPHOS 39 42  BILITOT 0.6 0.6  PROT 7.6 7.9  ALBUMIN 4.2 4.4    Recent Labs Lab 10/08/14 1906  LIPASE 23   CBC:  Recent Labs Lab 10/08/14 1906 10/09/14 0703  WBC 8.2 10.2  NEUTROABS 3.7  --   HGB 13.0 12.9  HCT 36.7 36.6  MCV 81.6 81.5  PLT 234 221     Studies: No results found.  Scheduled Meds: . enoxaparin (LOVENOX) injection  40 mg Subcutaneous Q24H  . gi cocktail  30 mL Oral Once  . Influenza vac split quadrivalent PF  0.5 mL Intramuscular Tomorrow-1000  . methocarbamol  500 mg Oral BID  . pneumococcal 23 valent vaccine  0.5 mL Intramuscular Tomorrow-1000   Continuous Infusions: . sodium chloride 1 mL (10/09/14 1905)    Active Problems:   Abdominal pain  Vomiting   Nausea & vomiting    Time spent: 30 minutes   Erick Blinks, M.D.  [facility] Pager [phone]. If 7PM-7AM, please contact night-coverage at [email or web address], password [facility] 10/10/2014, 7:45 AM  LOS: 1 day    By signing my name below, I, Burnett Harry, attest that this documentation has been prepared under the direction and in the presence of [facility]. MD Electronically Signed: Burnett Harry, Scribe. 10/10/2014   I, Dr. Erick Blinks, personally performed  the services described in this documentaiton. All medical record entries made by the scribe were at my direction and in my presence. I have reviewed the chart and agree that the record reflects my personal performance and is accurate and complete  Erick Blinks, MD, 10/10/2014 12:12 PM

## 2014-10-10 NOTE — Progress Notes (Signed)
1251 Dr.[place] called to report that he is on vacation at this time and neuro consults need to be referred to [facility]. Hospitialist notified.

## 2014-10-10 NOTE — Clinical Documentation Improvement (Signed)
Internal Medicine  A cause and effect relationship may not be assumed and must be documented by a provider.  Please clarify the relationship, if any, between *abdominal pain, *nausea and vomiting* and *sickle cell crisis*  Are the conditions:   Due to or associated with each other  Unrelated to each other  Other  Clinically Undetermined   Please exercise your independent, professional judgment when responding. A specific answer is not anticipated or expected.   Thank You,  Cruzita Lederer, RN CDS Health Information Management [place] [phone]

## 2014-10-11 ENCOUNTER — Inpatient Hospital Stay (HOSPITAL_COMMUNITY): Payer: Medicaid Other

## 2014-10-11 DIAGNOSIS — G43709 Chronic migraine without aura, not intractable, without status migrainosus: Secondary | ICD-10-CM

## 2014-10-11 DIAGNOSIS — M797 Fibromyalgia: Secondary | ICD-10-CM | POA: Diagnosis present

## 2014-10-11 DIAGNOSIS — R29898 Other symptoms and signs involving the musculoskeletal system: Secondary | ICD-10-CM | POA: Diagnosis present

## 2014-10-11 DIAGNOSIS — F411 Generalized anxiety disorder: Secondary | ICD-10-CM

## 2014-10-11 DIAGNOSIS — G43919 Migraine, unspecified, intractable, without status migrainosus: Secondary | ICD-10-CM | POA: Diagnosis present

## 2014-10-11 DIAGNOSIS — G43909 Migraine, unspecified, not intractable, without status migrainosus: Secondary | ICD-10-CM | POA: Diagnosis present

## 2014-10-11 DIAGNOSIS — E538 Deficiency of other specified B group vitamins: Secondary | ICD-10-CM | POA: Diagnosis present

## 2014-10-11 MED ORDER — SODIUM CHLORIDE 0.9 % IJ SOLN: 10.0000 mL | INTRAMUSCULAR | Status: DC | PRN

## 2014-10-11 MED ORDER — SODIUM CHLORIDE 0.9 % IJ SOLN
10.0000 mL | Freq: Two times a day (BID) | INTRAMUSCULAR | Status: DC
  Administered 2014-10-11 – 2014-10-12 (×2): 10 mL

## 2014-10-11 NOTE — Progress Notes (Signed)
Peripherally Inserted Central Catheter/Midline Placement  The IV Nurse has discussed with the patient and/or persons authorized to consent for the patient, the purpose of this procedure and the potential benefits and risks involved with this procedure.  The benefits include less needle sticks, lab draws from the catheter and patient may be discharged home with the catheter.  Risks include, but not limited to, infection, bleeding, blood clot (thrombus formation), and puncture of an artery; nerve damage and irregular heat beat.  Alternatives to this procedure were also discussed.  PICC/Midline Placement Documentation  PICC / Midline Single Lumen 10/11/14 PICC Right Basilic 40 cm 1 cm (Active)  Indication for Insertion or Continuance of Line Limited venous access - need for IV therapy >5 days (PICC only) 10/11/2014  7:00 PM  Exposed Catheter (cm) 1 cm 10/11/2014  7:00 PM  Site Assessment Clean;Dry;Intact 10/11/2014  7:00 PM  Line Status Flushed;Blood return noted 10/11/2014  7:00 PM  Dressing Type Transparent;Securing device 10/11/2014  7:00 PM  Dressing Status Clean;Dry;Intact;Antimicrobial disc in place 10/11/2014  7:00 PM  Line Care Connections checked and tightened 10/11/2014  7:00 PM  Dressing Intervention New dressing 10/11/2014  7:00 PM  Dressing Change Due 10/18/14 10/11/2014  7:00 PM       Tanya Krause 10/11/2014, 7:36 PM

## 2014-10-11 NOTE — Progress Notes (Signed)
[facility] PROGRESS NOTE  Tanya Krause VEL:[id number] DOB: 07/26/1985 DOA: 10/08/2014 PCP: Default, Provider, MD   Summary:  This is a 29 y/o female who recently moved to [place] from [facility] to care for her parents. She reports that over the last week, she has developed worsening pain and tingling in her lower extremities bilaterally with associated weakness. Over the last few days, she has had increasing difficulty in walking. She is unable to bear any weight on her feet due to pain, and says her legs are too weak to move in bed. She has not had bowel or bladder incontinence. Work up thus far included normal TSH and CK. B12 was noted to be markedly low at 91. She was started on B12 supplementation but continues to complain of lower extremity weakness. Case was discussed with Dr. Thad Ranger on call for neurology and recommendations were for further work up including MRI studies. She will need to transfer to [facility] for further evaluation since MRI and neurology services are not available at [facility] this weekend.   Assessment/Plan: 1. Nausea and vomiting. CT A/P unremarkable. UA  and pregnancy test were negative. Will continue on antiemetics and advance diet as tolerated. 2. Vitamin B12 deficiency with associated neuropathy. Intrinsic factor antibodies and antiparietal cell antibodies have been sent. She received B12 IM x1 and started on B12 SL daily. She has also been started on Folate supplementation.  3. Lower extremity weakness/pain. Patient is unable to walk independently. She is a poor historian, feels that she has worsening paraesthesias, weakness and pain in her bilateral lower extremities. She does not have any bowel or bladder incontinence at this time. Case was discussed with Dr. Thad Ranger on call for neurology at [facility] and it was felt that her symptoms may be related to B12 deficiency, but she also recommends further investigation with MRI. Unfortunately, we do not have MRI services  available at [facility] at this time. She will need to transfer to [facility] for further evaluation. Since she will be transferred, she will also benefit from formal neurology consultation, which is also not available at [facility] today. Case discussed with Dr. Jerral Ralph who has accepted the patient in transfer.  4. H/o migraines, continue Fioricet. Pateint has seen Dr. Marjory Lies in the past.  5. Anxiety. Continue home Xanax dose. 6. Chronic pain syndrome. Patient has chronic abdominal pain and fibromyalgia. Patient takes Percocet as an outpatient. Will need to follow up with pain clinic. 7. Possible sickle cell. 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 Hemoglobin electrophoresis has been ordered and is in process.  Code Status: Full DVT Prophylaxis: Lovenox Family discussion: No family at bedside. Discussed with patient who understands and has no concerns at this time. Disposition Plan: Transfer to [facility] for further evaluation    Consultants:    Procedures:    Antibiotics:    HPI/Subjective: Feels the same. Tried to eat soup this morning but was unable to keep it down. Was able to take a shower but noticeable shaking in her legs when ambulating. Pain is increased in her legs when she puts pressure on them while walking. Also complains of back pains in lower and middle back.    Objective: Filed Vitals:   10/11/14 0634  BP: 124/86  Pulse: 82  Temp: 98.4 F (36.9 C)  Resp: 20    Intake/Output Summary (Last 24 hours) at 10/11/14 1703 Last data filed at 10/11/14 0900  Gross per 24 hour  Intake      0 ml  Output    250 ml  Net   -250 ml   Filed Weights   10/08/14 1734 10/08/14 2024  Weight: 74.844 kg (165 lb) 50.531 kg (111 lb 6.4 oz)    Exam:   General:NAD, sitting on the edge of the bed.   Cardiovascular: RRR, no m/r/g. No LE edema.  Respiratory: CTA bilaterally, no w/r/r. Normal respiratory  effort.  Abdomen: soft, diffusely tender, positive bowel sounds  Musculoskeletal: Pedal pulses are intact. Lower extremities are tender to palpation  Psychiatric: Calm and cooperative   Neurologic: 1-2/5 strength LE bilaterally. Patellar DTRs intact   Data Reviewed: Basic Metabolic Panel:  Recent Labs Lab 10/08/14 1906 10/09/14 0703  NA 138 135  K 3.5 3.9  CL 107 104  CO2 25 24  GLUCOSE 94 104*  BUN 7 5*  CREATININE 0.55 0.48  CALCIUM 8.8* 8.8*   Liver Function Tests:  Recent Labs Lab 10/08/14 1906 10/09/14 0703  AST 16 20  ALT 9* 10*  ALKPHOS 39 42  BILITOT 0.6 0.6  PROT 7.6 7.9  ALBUMIN 4.2 4.4    Recent Labs Lab 10/08/14 1906  LIPASE 23   CBC:  Recent Labs Lab 10/08/14 1906 10/09/14 0703  WBC 8.2 10.2  NEUTROABS 3.7  --   HGB 13.0 12.9  HCT 36.7 36.6  MCV 81.6 81.5  PLT 234 221     Studies: Ct Abdomen Pelvis W Contrast  10/10/2014   CLINICAL DATA:  Abdominal pain, vomiting  EXAM: CT ABDOMEN AND PELVIS WITH CONTRAST  TECHNIQUE: Multidetector CT imaging of the abdomen and pelvis was performed using the standard protocol following bolus administration of intravenous contrast.  CONTRAST:  OMNIPAQUE IOHEXOL 300 MG/ML  SOLN  COMPARISON:  07/29/2014  FINDINGS: Sagittal images of the spine are unremarkable. Lung bases are unremarkable. Liver, pancreas, spleen and adrenal glands are unremarkable. No focal hepatic mass.  No aortic aneurysm.  Kidneys are symmetrical in size and enhancement. No hydronephrosis or hydroureter.  No small bowel obstruction.  No ascites or free air.  No adenopathy.  Normal appendix. No pericecal inflammation. The terminal ileum is unremarkable. The uterus and ovaries are unremarkable. The uterus is anteflexed. Urinary bladder is unremarkable. No inguinal adenopathy. No pelvic ascites or adenopathy.  IMPRESSION: 1. No acute inflammatory process within abdomen or pelvis. 2. Normal appendix.  No pericecal inflammation. 3. No  hydronephrosis or hydroureter. 4. No small bowel obstruction.   Electronically Signed   By: Natasha Mead M.D.   On: 10/10/2014 14:48    Scheduled Meds: . amitriptyline  25 mg Oral QHS  . enoxaparin (LOVENOX) injection  40 mg Subcutaneous Q24H  . folic acid  1 mg Oral Daily  . gi cocktail  30 mL Oral Once  . methocarbamol  500 mg Oral BID  . nicotine  21 mg Transdermal Daily  . vitamin B-12  5,000 mcg Oral Daily   Continuous Infusions: . sodium chloride 100 mL/hr at 10/11/14 7564    Active Problems:   Abdominal pain   Vomiting   Nausea & vomiting   Chronic migraine   Fibromyalgia   Lower extremity weakness   Vitamin B12 deficiency   Anxiety state   Time spent: 45 minutes   Erick Blinks, M.D.  [facility] Pager [phone]. If 7PM-7AM, please contact night-coverage at [email or web address], password [facility] 10/11/2014, 5:03 PM  LOS: 2 days     By signing my name  below, I, Zadie Cleverly, attest that this documentation has been prepared under the direction and in the presence of Erick Blinks, MD. Electronically signed: Zadie Cleverly, Scribe. 10/11/2014 12:15 PM   I, Dr. Erick Blinks, personally performed the services described in this documentaiton. All medical record entries made by the scribe were at my direction and in my presence. I have reviewed the chart and agree that the record reflects my personal performance and is accurate and complete  Erick Blinks, MD, 10/11/2014 5:03 PM

## 2014-10-12 ENCOUNTER — Inpatient Hospital Stay (HOSPITAL_COMMUNITY): Payer: Medicaid Other

## 2014-10-12 DIAGNOSIS — R1110 Vomiting, unspecified: Secondary | ICD-10-CM

## 2014-10-12 LAB — BASIC METABOLIC PANEL
ANION GAP: 8 (ref 5–15)
BUN: <5 mg/dL — ABNORMAL LOW (ref 6–20)
CALCIUM: 9.1 mg/dL (ref 8.9–10.3)
CO2: 27 mmol/L (ref 22–32)
Chloride: 101 mmol/L (ref 101–111)
Creatinine, Ser: 0.73 mg/dL (ref 0.44–1.00)
Glucose, Bld: 86 mg/dL (ref 65–99)
Potassium: 3.1 mmol/L — ABNORMAL LOW (ref 3.5–5.1)
Sodium: 136 mmol/L (ref 135–145)

## 2014-10-12 LAB — CBC
HCT: 35.3 % — ABNORMAL LOW (ref 36.0–46.0)
HEMOGLOBIN: 12.3 g/dL (ref 12.0–15.0)
MCH: 28 pg (ref 26.0–34.0)
MCHC: 34.8 g/dL (ref 30.0–36.0)
MCV: 80.4 fL (ref 78.0–100.0)
Platelets: 186 10*3/uL (ref 150–400)
RBC: 4.39 MIL/uL (ref 3.87–5.11)
RDW: 13.1 % (ref 11.5–15.5)
WBC: 8.4 10*3/uL (ref 4.0–10.5)

## 2014-10-12 MED ORDER — ONDANSETRON HCL 4 MG/2ML IJ SOLN
4.0000 mg | Freq: Four times a day (QID) | INTRAMUSCULAR | Status: DC
  Administered 2014-10-12 – 2014-10-15 (×12): 4 mg via INTRAVENOUS
  Filled 2014-10-12 (×13): qty 2

## 2014-10-12 MED ORDER — METHOCARBAMOL 1000 MG/10ML IJ SOLN
500.0000 mg | Freq: Three times a day (TID) | INTRAVENOUS | Status: DC
  Administered 2014-10-12 – 2014-10-16 (×12): 500 mg via INTRAVENOUS
  Filled 2014-10-12 (×16): qty 5

## 2014-10-12 MED ORDER — FENTANYL CITRATE (PF) 100 MCG/2ML IJ SOLN
12.5000 ug | Freq: Four times a day (QID) | INTRAMUSCULAR | Status: DC | PRN
  Administered 2014-10-12 – 2014-10-14 (×3): 25 ug via INTRAVENOUS
  Filled 2014-10-12 (×3): qty 2

## 2014-10-12 MED ORDER — LORAZEPAM 2 MG/ML IJ SOLN
0.5000 mg | Freq: Four times a day (QID) | INTRAMUSCULAR | Status: DC | PRN
  Administered 2014-10-13 – 2014-10-15 (×4): 0.5 mg via INTRAVENOUS
  Filled 2014-10-12 (×4): qty 1

## 2014-10-12 MED ORDER — CYANOCOBALAMIN 1000 MCG/ML IJ SOLN
1000.0000 ug | Freq: Every day | INTRAMUSCULAR | Status: DC
  Administered 2014-10-13 – 2014-10-16 (×3): 1000 ug via SUBCUTANEOUS
  Filled 2014-10-12 (×6): qty 1

## 2014-10-12 MED ORDER — POTASSIUM CHLORIDE 10 MEQ/100ML IV SOLN
10.0000 meq | Freq: Once | INTRAVENOUS | Status: AC
  Administered 2014-10-12: 10 meq via INTRAVENOUS
  Filled 2014-10-12: qty 100

## 2014-10-12 MED ORDER — POTASSIUM CHLORIDE 10 MEQ/100ML IV SOLN
10.0000 meq | INTRAVENOUS | Status: AC
  Administered 2014-10-12 (×3): 10 meq via INTRAVENOUS
  Filled 2014-10-12 (×3): qty 100

## 2014-10-12 MED ORDER — PROMETHAZINE HCL 25 MG PO TABS
25.0000 mg | ORAL_TABLET | Freq: Four times a day (QID) | ORAL | Status: DC
Start: 2014-10-12 — End: 2014-10-12

## 2014-10-12 MED ORDER — CYANOCOBALAMIN 1000 MCG/ML IJ SOLN
1000.0000 ug | Freq: Once | INTRAMUSCULAR | Status: AC
  Administered 2014-10-12: 1000 ug via SUBCUTANEOUS
  Filled 2014-10-12: qty 1

## 2014-10-12 MED ORDER — ONDANSETRON HCL 4 MG PO TABS
4.0000 mg | ORAL_TABLET | Freq: Four times a day (QID) | ORAL | Status: DC
  Administered 2014-10-16: 4 mg via ORAL
  Filled 2014-10-12 (×2): qty 1

## 2014-10-12 MED ORDER — PROMETHAZINE HCL 25 MG/ML IJ SOLN
25.0000 mg | Freq: Four times a day (QID) | INTRAMUSCULAR | Status: DC
  Administered 2014-10-12 – 2014-10-16 (×13): 25 mg via INTRAVENOUS
  Filled 2014-10-12 (×14): qty 1

## 2014-10-12 MED ORDER — DIPHENHYDRAMINE HCL 50 MG/ML IJ SOLN
25.0000 mg | Freq: Four times a day (QID) | INTRAMUSCULAR | Status: DC | PRN
  Administered 2014-10-12 – 2014-10-15 (×8): 25 mg via INTRAVENOUS
  Filled 2014-10-12 (×8): qty 1

## 2014-10-12 MED ORDER — PANTOPRAZOLE SODIUM 40 MG IV SOLR
40.0000 mg | INTRAVENOUS | Status: DC
  Administered 2014-10-12: 40 mg via INTRAVENOUS
  Filled 2014-10-12: qty 40

## 2014-10-12 MED ORDER — FOLIC ACID 5 MG/ML IJ SOLN
1.0000 mg | Freq: Every day | INTRAMUSCULAR | Status: DC
  Administered 2014-10-12: 1 mg via INTRAVENOUS
  Filled 2014-10-12 (×2): qty 0.2

## 2014-10-12 NOTE — Progress Notes (Signed)
Late entry New Admission Note:   Arrival Method: stretcher fron [employer] Mental Orientation: axox4 Telemetry:yes Assessment: Completed Skin:intact YP:PJKDT arm picc Pain:10/10 Tubes: Safety Measures: Safety Fall Prevention Plan has been given Admission: [address] [place] Orientation: Patient has been orientated to the room, unit and staff.  Family:at bedside  Orders have been reviewed and implemented. Will continue to monitor the patient. Call light has been placed within reach and bed alarm has been activated.   Clement Sayres, RN Phone number: [phone]

## 2014-10-12 NOTE — Progress Notes (Signed)
[facility] Progress Note   Tanya Krause  ZDG:[id number]  DOB: 1985/01/30  DOA: 10/08/2014 PCP: Default, Provider, MD  Brief narrative: Tanya Krause is a 29 y.o. female with a history of fibromyalgia who presents with a complaint of bilateral lower extremity pain followed by numbness and tingling and subsequently followed by weakness. Symptoms started about 1 week ago. Soon after the symptoms started, the patient became nauseated and started vomiting. She attributes the GI symptoms to the pain in her legs. No complaint of diarrhea. She has diffuse abdominal tenderness. No fever or chills.   Subjective: Continues to have numbness tingling and pain in bilateral lower extremities. Unable to feel the floor. His nauseated and is retching. Not able to eat or drink. Has diffuse abdominal tenderness. No history of peptic ulcer disease. No hematemesis.  Assessment/Plan: Principal Problem:   Lower extremity weakness/  Vitamin B12 deficiency -Bilateral lower extremity weakness, numbness tingling and pain likely secondary to severe B-12 deficiency-B-12 level 91 -MRI of the lumbar and thoracic spine is negative for cord compression  -Change oral B-12 to s/c and continue daily injections -Intrinsic factor and antiparietal antibodies pending  Active Problems:   Abdominal pain/ Nausea & vomiting -She attributes this to pain in her legs -no sick contacts and no history of ingesting fast food prior to symptoms-no fever or chills and no diarrhea -Abdomen is diffusely tender on exam-imaging performed in the ER on 9/23 did not reveal any acute abnormalities -We'll change all oral medications to IV -We'll place on Phenergan and Zofran every 6 hours routine -Start Protonix via IV -She wants to try to "nibble" on solid food-I have placed her on a soft diet  Hypokalemia -Replaced via IV-likely secondary to poor by mouth intake and vomiting    Fibromyalgia -States she is on Robaxin for this  (muscle cramping?)-Will continue via IV    Anxiety state -Range oral Xanax to IV Ativan  Cigarette smoker -Have counseled her-continue nicotine patch  Sickle cell anemia? -Does not have anemia -Hemoglobin electrophoresis.   Code Status:     Code Status Orders        Start     Ordered   10/08/14 2234  Full code   Continuous     10/08/14 2233     Disposition Plan: Home home health versus SNF DVT prophylaxis: Lovenox  Antibiotics: Anti-infectives    None      Objective: Filed Weights   10/08/14 1734 10/08/14 2024 10/11/14 2141  Weight: 74.844 kg (165 lb) 50.531 kg (111 lb 6.4 oz) 65.59 kg (144 lb 9.6 oz)    Intake/Output Summary (Last 24 hours) at 10/12/14 1319 Last data filed at 10/12/14 1301  Gross per 24 hour  Intake   5380 ml  Output    200 ml  Net   5180 ml     Vitals Filed Vitals:   10/11/14 2015 10/11/14 2141 10/12/14 0517 10/12/14 0857  BP: 125/105 139/101 107/70 100/72  Pulse: 90 86 95 96  Temp: 98.6 F (37 C) 98.3 F (36.8 C) 97.9 F (36.6 C) 98 F (36.7 C)  TempSrc: Oral Oral Oral Oral  Resp: 18 18 16 17   Height:      Weight:  65.59 kg (144 lb 9.6 oz)    SpO2: 100% 99% 98% 95%    Exam:  General:  Pt is alert, not in acute distress  HEENT: No icterus, No thrush, oral mucosa moist  Cardiovascular: regular rate and rhythm, S1/S2 No murmur  Respiratory: clear to auscultation bilaterally   Abdomen: Soft, +Bowel sounds, diffusely tender, non distended, no guarding  MSK: No LE edema, cyanosis or clubbing  Neurology: Numbness in bilateral lower extremities-strength 5 over 5 throughout-cranial nerves II through XII intact  Data Reviewed: Basic Metabolic Panel:  Recent Labs Lab 10/08/14 1906 10/09/14 0703 10/12/14 1150  NA 138 135 136  K 3.5 3.9 3.1*  CL 107 104 101  CO2 25 24 27   GLUCOSE 94 104* 86  BUN 7 5* <5*  CREATININE 0.55 0.48 0.73  CALCIUM 8.8* 8.8* 9.1   Liver Function Tests:  Recent Labs Lab 10/08/14 1906  10/09/14 0703  AST 16 20  ALT 9* 10*  ALKPHOS 39 42  BILITOT 0.6 0.6  PROT 7.6 7.9  ALBUMIN 4.2 4.4    Recent Labs Lab 10/08/14 1906  LIPASE 23   No results for input(s): AMMONIA in the last 168 hours. CBC:  Recent Labs Lab 10/08/14 1906 10/09/14 0703 10/12/14 1150  WBC 8.2 10.2 8.4  NEUTROABS 3.7  --   --   HGB 13.0 12.9 12.3  HCT 36.7 36.6 35.3*  MCV 81.6 81.5 80.4  PLT 234 221 186   Cardiac Enzymes:  Recent Labs Lab 10/09/14 1440  CKTOTAL 90   BNP (last 3 results) No results for input(s): BNP in the last 8760 hours.  ProBNP (last 3 results) No results for input(s): PROBNP in the last 8760 hours.  CBG: No results for input(s): GLUCAP in the last 168 hours.  No results found for this or any previous visit (from the past 240 hour(s)).   Studies: Mr Thoracic Spine Wo Contrast  10/12/2014   CLINICAL DATA:  29 year old female with 1 week of pain and tingling in the lower extremities with weakness. Increased difficulty walking. Reportedly B12 the levels are low. Initial encounter.  EXAM: MRI THORACIC AND LUMBAR SPINE WITHOUT CONTRAST  TECHNIQUE: Multiplanar and multiecho pulse sequences of the thoracic and lumbar spine were obtained without intravenous contrast.  COMPARISON:  Chest CTA 06/07/2010. CT Abdomen and Pelvis 10/10/2014, and earlier.  FINDINGS: MR THORACIC SPINE FINDINGS  Limited sagittal T1 weighted imaging of the cervical spine is unremarkable.  Mild levoconvex thoracic scoliosis, otherwise normal thoracic vertebral height and alignment. No marrow edema or evidence of acute osseous abnormality. Negative visualized posterior paraspinal soft tissues.  Negative visualized thoracic and upper abdominal viscera.  Visualized lower cervical and thoracic spinal cord signal and morphology is within normal limits; mild prominence of the central spinal canal suspected at the C6-C7 level, normal anatomic variant. Normal patency of the thoracic spinal canal. No significant  thoracic disc degeneration.  MR LUMBAR SPINE FINDINGS  Normal lumbar segmentation. Minimal levoconvex scoliosis, otherwise normal vertebral height and alignment. No marrow edema or evidence of acute osseous abnormality.  Visualized lower thoracic spinal cord is normal with conus medularis at L1-L2. Cauda equina nerve roots appear normal.  The bladder is distended. Otherwise negative visualized abdominal and pelvic viscera. Negative visualized posterior paraspinal soft tissues.  No lumbar spinal stenosis. No significant lumbar disc degeneration. No lumbar foraminal stenosis.  IMPRESSION: Normal noncontrast MRI appearance of the thoracic and lumbar spine.   Electronically Signed   By: 10/12/2014 M.D.   On: 10/12/2014 11:09   Mr Lumbar Spine Wo Contrast  10/12/2014   CLINICAL DATA:  29 year old female with 1 week of pain and tingling in the lower extremities with weakness. Increased difficulty walking. Reportedly B12 the levels are low. Initial encounter.  EXAM: MRI  THORACIC AND LUMBAR SPINE WITHOUT CONTRAST  TECHNIQUE: Multiplanar and multiecho pulse sequences of the thoracic and lumbar spine were obtained without intravenous contrast.  COMPARISON:  Chest CTA 06/07/2010. CT Abdomen and Pelvis 10/10/2014, and earlier.  FINDINGS: MR THORACIC SPINE FINDINGS  Limited sagittal T1 weighted imaging of the cervical spine is unremarkable.  Mild levoconvex thoracic scoliosis, otherwise normal thoracic vertebral height and alignment. No marrow edema or evidence of acute osseous abnormality. Negative visualized posterior paraspinal soft tissues.  Negative visualized thoracic and upper abdominal viscera.  Visualized lower cervical and thoracic spinal cord signal and morphology is within normal limits; mild prominence of the central spinal canal suspected at the C6-C7 level, normal anatomic variant. Normal patency of the thoracic spinal canal. No significant thoracic disc degeneration.  MR LUMBAR SPINE FINDINGS  Normal lumbar  segmentation. Minimal levoconvex scoliosis, otherwise normal vertebral height and alignment. No marrow edema or evidence of acute osseous abnormality.  Visualized lower thoracic spinal cord is normal with conus medularis at L1-L2. Cauda equina nerve roots appear normal.  The bladder is distended. Otherwise negative visualized abdominal and pelvic viscera. Negative visualized posterior paraspinal soft tissues.  No lumbar spinal stenosis. No significant lumbar disc degeneration. No lumbar foraminal stenosis.  IMPRESSION: Normal noncontrast MRI appearance of the thoracic and lumbar spine.   Electronically Signed   By: Odessa Fleming M.D.   On: 10/12/2014 11:09   Ct Abdomen Pelvis W Contrast  10/10/2014   CLINICAL DATA:  Abdominal pain, vomiting  EXAM: CT ABDOMEN AND PELVIS WITH CONTRAST  TECHNIQUE: Multidetector CT imaging of the abdomen and pelvis was performed using the standard protocol following bolus administration of intravenous contrast.  CONTRAST:  OMNIPAQUE IOHEXOL 300 MG/ML  SOLN  COMPARISON:  07/29/2014  FINDINGS: Sagittal images of the spine are unremarkable. Lung bases are unremarkable. Liver, pancreas, spleen and adrenal glands are unremarkable. No focal hepatic mass.  No aortic aneurysm.  Kidneys are symmetrical in size and enhancement. No hydronephrosis or hydroureter.  No small bowel obstruction.  No ascites or free air.  No adenopathy.  Normal appendix. No pericecal inflammation. The terminal ileum is unremarkable. The uterus and ovaries are unremarkable. The uterus is anteflexed. Urinary bladder is unremarkable. No inguinal adenopathy. No pelvic ascites or adenopathy.  IMPRESSION: 1. No acute inflammatory process within abdomen or pelvis. 2. Normal appendix.  No pericecal inflammation. 3. No hydronephrosis or hydroureter. 4. No small bowel obstruction.   Electronically Signed   By: Natasha Mead M.D.   On: 10/10/2014 14:48   Dg Chest Port 1 View  10/11/2014   CLINICAL DATA:  Status post PICC line  placement, history of sickle cell disease  EXAM: PORTABLE CHEST - 1 VIEW  COMPARISON:  08/11/2014  FINDINGS: Cardiac shadow is within normal limits. The lungs are clear bilaterally. No bony abnormality is noted. A new right-sided PICC line is noted with the tip at the cavoatrial junction in satisfactory position.  IMPRESSION: Satisfactory placement of right-sided PICC line.   Electronically Signed   By: Alcide Clever M.D.   On: 10/11/2014 19:29    Scheduled Meds:  Scheduled Meds: . amitriptyline  25 mg Oral QHS  . cyanocobalamin  1,000 mcg Subcutaneous Once  . enoxaparin (LOVENOX) injection  40 mg Subcutaneous Q24H  . folic acid  1 mg Intravenous Daily  . gi cocktail  30 mL Oral Once  . methocarbamol (ROBAXIN)  IV  500 mg Intravenous 3 times per day  . nicotine  21 mg Transdermal Daily  .  ondansetron  4 mg Oral Q6H   Or  . ondansetron (ZOFRAN) IV  4 mg Intravenous Q6H  . pantoprazole (PROTONIX) IV  40 mg Intravenous Q24H  . promethazine  25 mg Intravenous Q6H  . sodium chloride  10-40 mL Intracatheter Q12H  . vitamin B-12  5,000 mcg Oral Daily   Continuous Infusions: . sodium chloride 100 mL/hr at 10/11/14 2238    Time spent on care of this patient: 35 min   Tanya Krause,SAIMA, MD 10/12/2014, 1:19 PM  LOS: 3 days   [facility] Office  [phone] Pager - Text Page per [email or web address] If 7PM-7AM, please contact night-coverage [email or web address]

## 2014-10-13 LAB — BASIC METABOLIC PANEL
Anion gap: 5 (ref 5–15)
BUN: <5 mg/dL — ABNORMAL LOW (ref 6–20)
CALCIUM: 8.6 mg/dL — AB (ref 8.9–10.3)
CHLORIDE: 106 mmol/L (ref 101–111)
CO2: 26 mmol/L (ref 22–32)
CREATININE: 0.74 mg/dL (ref 0.44–1.00)
GFR calc Af Amer: >60 mL/min (ref >60)
GFR calc non Af Amer: >60 mL/min (ref >60)
GLUCOSE: 83 mg/dL (ref 65–99)
Potassium: 3.4 mmol/L — ABNORMAL LOW (ref 3.5–5.1)
Sodium: 137 mmol/L (ref 135–145)

## 2014-10-13 LAB — INTRINSIC FACTOR ANTIBODIES: INTRINSIC FACTOR: 1.1 [AU]/ml (ref 0.0–1.1)

## 2014-10-13 LAB — ANTI-PARIETAL ANTIBODY: PARIETAL CELL ANTIBODY-IGG: 8.9 U (ref 0.0–20.0)

## 2014-10-13 LAB — MAGNESIUM: Magnesium: 1.4 mg/dL — ABNORMAL LOW (ref 1.7–2.4)

## 2014-10-13 MED ORDER — MAGNESIUM SULFATE 2 GM/50ML IV SOLN
2.0000 g | Freq: Once | INTRAVENOUS | Status: AC
  Administered 2014-10-13: 2 g via INTRAVENOUS
  Filled 2014-10-13: qty 50

## 2014-10-13 MED ORDER — POTASSIUM CHLORIDE CRYS ER 20 MEQ PO TBCR
40.0000 meq | EXTENDED_RELEASE_TABLET | Freq: Once | ORAL | Status: AC
  Administered 2014-10-13: 40 meq via ORAL
  Filled 2014-10-13: qty 2

## 2014-10-13 MED ORDER — FOLIC ACID 1 MG PO TABS
1.0000 mg | ORAL_TABLET | Freq: Every day | ORAL | Status: DC
  Administered 2014-10-13 – 2014-10-16 (×4): 1 mg via ORAL
  Filled 2014-10-13 (×4): qty 1

## 2014-10-13 MED ORDER — PANTOPRAZOLE SODIUM 40 MG PO TBEC
40.0000 mg | DELAYED_RELEASE_TABLET | Freq: Every day | ORAL | Status: DC
  Administered 2014-10-13 – 2014-10-16 (×4): 40 mg via ORAL
  Filled 2014-10-13 (×4): qty 1

## 2014-10-13 NOTE — Progress Notes (Signed)
PT Cancellation Note  Patient Details Name: Tanya Krause MRN: [id number] DOB: 1986-01-09   Cancelled Treatment:    Reason Eval/Treat Not Completed: Other (comment)   Lots of visitors; requesting PT eval later;   Will follow up later today as time allows;  Otherwise, will follow up for PT tomorrow;   Thank you,  Van Clines, PT  [facility] Pager [phone] Office [phone]     Van Clines [facility] 10/13/2014, 11:59 AM

## 2014-10-13 NOTE — Progress Notes (Addendum)
[facility] Progress Note   Tanya Krause  ZTI:[id number]  DOB: 14-Dec-1985  DOA: 10/08/2014 PCP: Default, Provider, MD  Brief narrative: Tanya Krause is a 29 y.o. female with a history of fibromyalgia who presents with a complaint of bilateral lower extremity pain followed by numbness and tingling and subsequently followed by weakness. Symptoms started about 1 week ago. Soon after the symptoms started, the patient became nauseated and started vomiting. She attributes the GI symptoms to the pain in her legs. No complaint of diarrhea. She has diffuse abdominal tenderness. No fever or chills.   Subjective: She is not vomiting currently but continues to be nauseated. Continues to have ongoing numbness tingling and pain in bilateral lower extremities. Unable to feel the floor. His nauseated and is retching.  Assessment/Plan: Principal Problem:   Lower extremity weakness/  Vitamin B12 deficiency -Bilateral lower extremity weakness, numbness tingling and pain likely secondary to severe B-12 deficiency-B-12 level 91 -MRI of the lumbar and thoracic spine is negative for cord compression  -Change oral B-12 to s/c and continue daily injections -Intrinsic factor and antiparietal antibodies pending  Active Problems:   Abdominal pain/ Nausea & vomiting -No history of peptic ulcer disease. No hematemesis. -She attributes the symptoms due to pain in her legs -no sick contacts and no history of ingesting fast food prior to symptoms-no fever or chills and no diarrhea -Abdomen is diffusely tender on exam-imaging performed in the ER on 9/23 did not reveal any acute abnormalities --Continue on Phenergan and Zofran every 6 hours routine and Protonix via IV -Beginning to tolerate solid food  Hypokalemia, hypomagnesemia -Continued to replace-likely secondary to poor by mouth intake and vomiting    Fibromyalgia -States she is on Robaxin for this (muscle cramping?)-Will continue via IV     Anxiety state -Have changed oral Xanax to IV Ativan  Cigarette smoker -Have counseled her-continue nicotine patch  Sickle cell anemia? -Does not have anemia -Hemoglobin electrophoresis.   Code Status:     Code Status Orders        Start     Ordered   10/08/14 2234  Full code   Continuous     10/08/14 2233     Disposition Plan: Home home health versus SNF DVT prophylaxis: Lovenox  Antibiotics: Anti-infectives    None      Objective: Filed Weights   10/08/14 2024 10/11/14 2141 10/12/14 2040  Weight: 50.531 kg (111 lb 6.4 oz) 65.59 kg (144 lb 9.6 oz) 65.772 kg (145 lb)    Intake/Output Summary (Last 24 hours) at 10/13/14 1326 Last data filed at 10/13/14 1153  Gross per 24 hour  Intake   1317 ml  Output    900 ml  Net    417 ml     Vitals Filed Vitals:   10/12/14 1604 10/12/14 2040 10/13/14 0359 10/13/14 0740  BP: 135/105 115/78 127/84 112/83  Pulse: 96 99 93 97  Temp: 98.4 F (36.9 C) 98.5 F (36.9 C) 98.3 F (36.8 C) 98 F (36.7 C)  TempSrc: Oral Oral Oral Oral  Resp: 18 16 16 17   Height:      Weight:  65.772 kg (145 lb)    SpO2: 96% 97% 100% 100%    Exam:  General:  Pt is alert, not in acute distress  HEENT: No icterus, No thrush, oral mucosa moist  Cardiovascular: regular rate and rhythm, S1/S2 No murmur  Respiratory: clear to auscultation bilaterally   Abdomen: Soft, +Bowel sounds, diffusely tender, non distended,  no guarding  MSK: No LE edema, cyanosis or clubbing  Neurology: Numbness in bilateral lower extremities-strength 5 over 5 throughout-cranial nerves II through XII intact  Data Reviewed: Basic Metabolic Panel:  Recent Labs Lab 10/08/14 1906 10/09/14 0703 10/12/14 1150 10/13/14 0552  NA 138 135 136 137  K 3.5 3.9 3.1* 3.4*  CL 107 104 101 106  CO2 25 24 27 26   GLUCOSE 94 104* 86 83  BUN 7 5* <5* <5*  CREATININE 0.55 0.48 0.73 0.74  CALCIUM 8.8* 8.8* 9.1 8.6*  MG  --   --   --  1.4*   Liver Function  Tests:  Recent Labs Lab 10/08/14 1906 10/09/14 0703  AST 16 20  ALT 9* 10*  ALKPHOS 39 42  BILITOT 0.6 0.6  PROT 7.6 7.9  ALBUMIN 4.2 4.4    Recent Labs Lab 10/08/14 1906  LIPASE 23   No results for input(s): AMMONIA in the last 168 hours. CBC:  Recent Labs Lab 10/08/14 1906 10/09/14 0703 10/12/14 1150  WBC 8.2 10.2 8.4  NEUTROABS 3.7  --   --   HGB 13.0 12.9 12.3  HCT 36.7 36.6 35.3*  MCV 81.6 81.5 80.4  PLT 234 221 186   Cardiac Enzymes:  Recent Labs Lab 10/09/14 1440  CKTOTAL 90   BNP (last 3 results) No results for input(s): BNP in the last 8760 hours.  ProBNP (last 3 results) No results for input(s): PROBNP in the last 8760 hours.  CBG: No results for input(s): GLUCAP in the last 168 hours.  No results found for this or any previous visit (from the past 240 hour(s)).   Studies: Mr Thoracic Spine Wo Contrast  10/12/2014   CLINICAL DATA:  29 year old female with 1 week of pain and tingling in the lower extremities with weakness. Increased difficulty walking. Reportedly B12 the levels are low. Initial encounter.  EXAM: MRI THORACIC AND LUMBAR SPINE WITHOUT CONTRAST  TECHNIQUE: Multiplanar and multiecho pulse sequences of the thoracic and lumbar spine were obtained without intravenous contrast.  COMPARISON:  Chest CTA 06/07/2010. CT Abdomen and Pelvis 10/10/2014, and earlier.  FINDINGS: MR THORACIC SPINE FINDINGS  Limited sagittal T1 weighted imaging of the cervical spine is unremarkable.  Mild levoconvex thoracic scoliosis, otherwise normal thoracic vertebral height and alignment. No marrow edema or evidence of acute osseous abnormality. Negative visualized posterior paraspinal soft tissues.  Negative visualized thoracic and upper abdominal viscera.  Visualized lower cervical and thoracic spinal cord signal and morphology is within normal limits; mild prominence of the central spinal canal suspected at the C6-C7 level, normal anatomic variant. Normal patency of  the thoracic spinal canal. No significant thoracic disc degeneration.  MR LUMBAR SPINE FINDINGS  Normal lumbar segmentation. Minimal levoconvex scoliosis, otherwise normal vertebral height and alignment. No marrow edema or evidence of acute osseous abnormality.  Visualized lower thoracic spinal cord is normal with conus medularis at L1-L2. Cauda equina nerve roots appear normal.  The bladder is distended. Otherwise negative visualized abdominal and pelvic viscera. Negative visualized posterior paraspinal soft tissues.  No lumbar spinal stenosis. No significant lumbar disc degeneration. No lumbar foraminal stenosis.  IMPRESSION: Normal noncontrast MRI appearance of the thoracic and lumbar spine.   Electronically Signed   By: Odessa Fleming M.D.   On: 10/12/2014 11:09   Mr Lumbar Spine Wo Contrast  10/12/2014   CLINICAL DATA:  29 year old female with 1 week of pain and tingling in the lower extremities with weakness. Increased difficulty walking. Reportedly B12 the levels  are low. Initial encounter.  EXAM: MRI THORACIC AND LUMBAR SPINE WITHOUT CONTRAST  TECHNIQUE: Multiplanar and multiecho pulse sequences of the thoracic and lumbar spine were obtained without intravenous contrast.  COMPARISON:  Chest CTA 06/07/2010. CT Abdomen and Pelvis 10/10/2014, and earlier.  FINDINGS: MR THORACIC SPINE FINDINGS  Limited sagittal T1 weighted imaging of the cervical spine is unremarkable.  Mild levoconvex thoracic scoliosis, otherwise normal thoracic vertebral height and alignment. No marrow edema or evidence of acute osseous abnormality. Negative visualized posterior paraspinal soft tissues.  Negative visualized thoracic and upper abdominal viscera.  Visualized lower cervical and thoracic spinal cord signal and morphology is within normal limits; mild prominence of the central spinal canal suspected at the C6-C7 level, normal anatomic variant. Normal patency of the thoracic spinal canal. No significant thoracic disc degeneration.  MR  LUMBAR SPINE FINDINGS  Normal lumbar segmentation. Minimal levoconvex scoliosis, otherwise normal vertebral height and alignment. No marrow edema or evidence of acute osseous abnormality.  Visualized lower thoracic spinal cord is normal with conus medularis at L1-L2. Cauda equina nerve roots appear normal.  The bladder is distended. Otherwise negative visualized abdominal and pelvic viscera. Negative visualized posterior paraspinal soft tissues.  No lumbar spinal stenosis. No significant lumbar disc degeneration. No lumbar foraminal stenosis.  IMPRESSION: Normal noncontrast MRI appearance of the thoracic and lumbar spine.   Electronically Signed   By: Odessa Fleming M.D.   On: 10/12/2014 11:09   Dg Chest Port 1 View  10/11/2014   CLINICAL DATA:  Status post PICC line placement, history of sickle cell disease  EXAM: PORTABLE CHEST - 1 VIEW  COMPARISON:  08/11/2014  FINDINGS: Cardiac shadow is within normal limits. The lungs are clear bilaterally. No bony abnormality is noted. A new right-sided PICC line is noted with the tip at the cavoatrial junction in satisfactory position.  IMPRESSION: Satisfactory placement of right-sided PICC line.   Electronically Signed   By: Alcide Clever M.D.   On: 10/11/2014 19:29    Scheduled Meds:  Scheduled Meds: . amitriptyline  25 mg Oral QHS  . cyanocobalamin  1,000 mcg Subcutaneous Q0600  . enoxaparin (LOVENOX) injection  40 mg Subcutaneous Q24H  . folic acid  1 mg Oral Daily  . gi cocktail  30 mL Oral Once  . methocarbamol (ROBAXIN)  IV  500 mg Intravenous 3 times per day  . nicotine  21 mg Transdermal Daily  . ondansetron  4 mg Oral Q6H   Or  . ondansetron (ZOFRAN) IV  4 mg Intravenous Q6H  . pantoprazole  40 mg Oral Daily  . promethazine  25 mg Intravenous Q6H  . sodium chloride  10-40 mL Intracatheter Q12H  . vitamin B-12  5,000 mcg Oral Daily   Continuous Infusions: . sodium chloride 100 mL/hr at 10/13/14 0640    Time spent on care of this patient: 35  min   RIZWAN,SAIMA, MD 10/13/2014, 1:26 PM  LOS: 4 days   [facility] Office  [phone] Pager - Text Page per [email or web address] If 7PM-7AM, please contact night-coverage [email or web address]

## 2014-10-13 NOTE — Clinical Social Work Note (Signed)
Received consult from MD for SNF placement for patient. Talked with patient about facility placement (full assessment to follow) and patient understands that Medicaid does not pay for therapies. Patient prefers to go home with [facility] services.   Genelle Bal, MSW, LCSW Licensed Clinical Social Worker Clinical Social Work Department [employer] [phone]

## 2014-10-13 NOTE — Evaluation (Signed)
Physical Therapy Evaluation Patient Details Name: ADELAIDE PFEFFERKORN MRN: [id number] DOB: 02/14/1985 Today's Date: 10/13/2014   History of Present Illness  GEORGIANN NEIDER is a 29 y.o. female with a history of fibromyalgia who presents with a complaint of bilateral lower extremity pain followed by numbness and tingling and subsequently followed by weakness. Symptoms started about 1 week ago. Soon after the symptoms started, the patient became nauseated and started vomiting. MDs are considering B12 deficiency as source of symptoms;  has a past medical history of Hypertension; Migraine; Asthma; Pelvic inflammatory disease (PID); Sickle cell disease; Fibromyalgia; and Chronic abdominal pain.  Clinical Impression   Pt admitted with above diagnosis. Pt currently with functional limitations due to the deficits listed below (see PT Problem List).  Pt will benefit from skilled PT to increase their independence and safety with mobility to allow discharge to the venue listed below.       Follow Up Recommendations CIR    Equipment Recommendations  Rolling walker with 5" wheels;3in1 (PT)    Recommendations for Other Services Rehab consult;OT consult     Precautions / Restrictions Precautions Precautions: Fall      Mobility  Bed Mobility Overal bed mobility: Needs Assistance Bed Mobility: Supine to Sit     Supine to sit: Min assist     General bed mobility comments: Pulls to long sit independently; min assist to shift her LEs to side and over EOB; noted she had adequate strength against gravity to fully extend bil knees at EOB  Transfers Overall transfer level: Needs assistance Equipment used: Rolling walker (2 wheeled) Transfers: Sit to/from Stand Sit to Stand: Min guard         General transfer comment: Very slow moving; cues for hand placement and safety  Ambulation/Gait Ambulation/Gait assistance: Min guard Ambulation Distance (Feet): 6 Feet Assistive device: Rolling walker (2  wheeled) Gait Pattern/deviations: Decreased step length - right;Decreased step length - left;Decreased stride length;Shuffle   Gait velocity interpretation: Below normal speed for age/gender General Gait Details: Very slow moving; heavily dependent on RW use; Noted bilateral LE shaking consistent with fatigue; Throwing up at end of walk  Stairs            Wheelchair Mobility    Modified Rankin (Stroke Patients Only)       Balance Overall balance assessment: Needs assistance         Standing balance support: Bilateral upper extremity supported Standing balance-Leahy Scale: Poor                               Pertinent Vitals/Pain Pain Assessment: 0-10 Pain Score: 9  Pain Location: Bil LEs when standing Pain Descriptors / Indicators: Aching Pain Intervention(s): Limited activity within patient's tolerance;Monitored during session;Premedicated before session;Repositioned    Home Living Family/patient expects to be discharged to:: Private residence Living Arrangements: Parent Available Help at Discharge: Family;Available PRN/intermittently Type of Home: House Home Access: Stairs to enter Entrance Stairs-Rails: None Entrance Stairs-Number of Steps: 1 Home Layout: One level Home Equipment: None      Prior Function Level of Independence: Independent               Hand Dominance        Extremity/Trunk Assessment   Upper Extremity Assessment: Overall WFL for tasks assessed           Lower Extremity Assessment: Generalized weakness (Pt requested no MMT due to pain)  Communication   Communication: No difficulties  Cognition Arousal/Alertness: Awake/alert Behavior During Therapy: WFL for tasks assessed/performed Overall Cognitive Status: Within Functional Limits for tasks assessed                      General Comments General comments (skin integrity, edema, etc.): Paged MD for possible follow-up on consulting  Neurology    Exercises        Assessment/Plan    PT Assessment Patient needs continued PT services  PT Diagnosis Difficulty walking;Generalized weakness   PT Problem List Decreased strength;Decreased range of motion;Decreased activity tolerance;Decreased balance;Decreased mobility;Decreased coordination;Decreased knowledge of use of DME;Pain;Decreased knowledge of precautions  PT Treatment Interventions DME instruction;Gait training;Stair training;Functional mobility training;Therapeutic activities;Therapeutic exercise;Balance training;Neuromuscular re-education;Patient/family education   PT Goals (Current goals can be found in the Care Plan section) Acute Rehab PT Goals Patient Stated Goal: Hopes to find a job and be able to care for her 29 yo and 29yo PT Goal Formulation: With patient Time For Goal Achievement: 10/27/14 Potential to Achieve Goals: Good    Frequency Min 3X/week   Barriers to discharge        Co-evaluation               End of Session Equipment Utilized During Treatment: Gait belt Activity Tolerance: Patient limited by pain;Patient limited by fatigue Patient left: in bed;with call bell/phone within reach Nurse Communication: Mobility status;Other (comment) (throwing up)         Time: 5885-0277 PT Time Calculation (min) (ACUTE ONLY): 32 min   Charges:   PT Evaluation $Initial PT Evaluation Tier I: 1 Procedure PT Treatments $Gait Training: 8-22 mins   PT G Codes:        Olen Pel 10/13/2014, 4:33 PM  Van Clines, [place]  Acute Rehabilitation Services Pager [phone] Office [phone]

## 2014-10-13 NOTE — Progress Notes (Signed)
Rehab Admissions Coordinator Note:  Patient was screened by Clois Dupes for appropriateness for an Inpatient Acute Rehab Consult per PT recommendation.  At this time, we are recommending HH. Do not feel pt has the medical necessity needed for in hospital intense rehab admission.   Clois Dupes 10/13/2014, 5:45 PM  I can be reached at [phone].

## 2014-10-14 LAB — BASIC METABOLIC PANEL
Anion gap: 6 (ref 5–15)
CALCIUM: 8.1 mg/dL — AB (ref 8.9–10.3)
CO2: 26 mmol/L (ref 22–32)
CREATININE: 0.74 mg/dL (ref 0.44–1.00)
Chloride: 105 mmol/L (ref 101–111)
Glucose, Bld: 83 mg/dL (ref 65–99)
Potassium: 3.6 mmol/L (ref 3.5–5.1)
SODIUM: 137 mmol/L (ref 135–145)

## 2014-10-14 LAB — MAGNESIUM: MAGNESIUM: 1.8 mg/dL (ref 1.7–2.4)

## 2014-10-14 LAB — HEMOGLOBINOPATHY EVALUATION
HGB F QUANT: 0 % (ref 0.0–2.0)
Hgb A2 Quant: 2.9 % (ref 0.7–3.1)
Hgb A: 61.4 % — ABNORMAL LOW (ref 94.0–98.0)
Hgb C: 35.7 % — ABNORMAL HIGH
Hgb S Quant: 0 %

## 2014-10-14 LAB — VITAMIN B12: Vitamin B-12: 3394 pg/mL — ABNORMAL HIGH (ref 180–914)

## 2014-10-14 MED ORDER — ENSURE ENLIVE PO LIQD
237.0000 mL | Freq: Three times a day (TID) | ORAL | Status: DC
  Administered 2014-10-14 – 2014-10-15 (×4): 237 mL via ORAL

## 2014-10-14 MED ORDER — VITAMIN B-12 1000 MCG PO TABS
1000.0000 ug | ORAL_TABLET | Freq: Every day | ORAL | Status: DC
  Administered 2014-10-15 – 2014-10-16 (×2): 1000 ug via ORAL
  Filled 2014-10-14 (×2): qty 1

## 2014-10-14 MED ORDER — MAGNESIUM SULFATE 2 GM/50ML IV SOLN
2.0000 g | Freq: Once | INTRAVENOUS | Status: AC
  Administered 2014-10-14: 2 g via INTRAVENOUS
  Filled 2014-10-14: qty 50

## 2014-10-14 MED ORDER — GABAPENTIN 100 MG PO CAPS: 100.0000 mg | ORAL_CAPSULE | Freq: Two times a day (BID) | ORAL | Status: DC

## 2014-10-14 MED ORDER — GABAPENTIN 100 MG PO CAPS
100.0000 mg | ORAL_CAPSULE | Freq: Three times a day (TID) | ORAL | Status: DC
  Administered 2014-10-14 – 2014-10-15 (×2): 100 mg via ORAL
  Filled 2014-10-14 (×2): qty 1

## 2014-10-14 MED ORDER — BISACODYL 10 MG RE SUPP
10.0000 mg | Freq: Once | RECTAL | Status: DC
  Filled 2014-10-14: qty 1

## 2014-10-14 MED ORDER — POTASSIUM CHLORIDE CRYS ER 20 MEQ PO TBCR
40.0000 meq | EXTENDED_RELEASE_TABLET | Freq: Once | ORAL | Status: AC
  Administered 2014-10-14: 40 meq via ORAL
  Filled 2014-10-14: qty 2

## 2014-10-14 NOTE — Progress Notes (Addendum)
[facility] Progress Note   Tanya Krause  KXF:[id number]  DOB: Feb 07, 1985  DOA: 10/08/2014 PCP: Default, Provider, MD  Brief narrative: Tanya Krause is a 29 y.o. female with a history of fibromyalgia who presents with a complaint of bilateral lower extremity pain followed by numbness and tingling and subsequently followed by weakness. Symptoms started about 1 week ago. Soon after the symptoms started, the patient became nauseated and started vomiting. She attributes the GI symptoms to the pain in her legs. No complaint of diarrhea. She has diffuse abdominal tenderness. No fever or chills.   Subjective: Nausea and vomiting are improving and she is beginning to drink more liquids now. Continues to have ongoing numbness tingling and pain in bilateral lower extremities. Still unable to feel the floor.  Assessment/Plan: Principal Problem:   Lower extremity weakness/  Vitamin B12 deficiency -Bilateral lower extremity weakness, numbness tingling and pain likely secondary to severe B-12 deficiency -B-12 level 91 on admission-has improved with replacement and is now elevated -MRI of the lumbar and thoracic spine is negative for cord compression  -Intrinsic factor and antiparietal antibodies negative, she is not a vegetarian -I have discussed the case with neurology-she may have idiopathic B-12 deficiency - recommended we start Neurontin instead of narcotics for pain - will start Neurontin at a dose of 100 mg 3 times a day-can readily increase to 300 TID -monitor for increasing somnolence - I have told patient that we will try to wean narcotics once pain control improves  Active Problems:   Abdominal pain/ Nausea & vomiting -No history of peptic ulcer disease. No hematemesis.-no sick contacts and no history of ingesting fast food prior to symptoms-no fever or chills and no diarrhea -Abdomen is diffusely tender on exam-imaging performed in the ER on 9/23 did not reveal any acute  abnormalities -She attributes the symptoms to pain in her legs- however I have a suspicion that ongoing nausea and vomiting may be coming from narcotics that are being used for her leg pain-as mentioned above, the goal is to wean narcotics as her pain improves --Continue on Phenergan and Zofran every 6 hours routine and Protonix via IV -Beginning to tolerate solid food- able to maintain hydration with liquids therefore will stop IV fluids today  Hypokalemia, hypomagnesemia -Currently adequately replaced but low normal -will give another dose of potassium and magnesium today   -Continue to follow-likely secondary to poor by mouth intake and vomiting    Fibromyalgia -States she is on Robaxin for this (muscle cramping?)-Will continue via IV    Anxiety state -Have changed oral Xanax to IV Ativan  Cigarette smoker -Have counseled her-continue nicotine patch  Sickle cell anemia? -Does not have anemia -Hemoglobin electrophoresis pending   Code Status:     Code Status Orders        Start     Ordered   10/08/14 2234  Full code   Continuous     10/08/14 2233     Disposition Plan: Home home health- will need walker, wheelchair and possibly other DME when she is ready to discharge DVT prophylaxis: Lovenox  Antibiotics: Anti-infectives    None      Objective: Filed Weights   10/11/14 2141 10/12/14 2040 10/13/14 2032  Weight: 65.59 kg (144 lb 9.6 oz) 65.772 kg (145 lb) 65.8 kg (145 lb 1 oz)    Intake/Output Summary (Last 24 hours) at 10/14/14 1420 Last data filed at 10/14/14 1123  Gross per 24 hour  Intake   4651 ml  Output    400 ml  Net   4251 ml     Vitals Filed Vitals:   10/13/14 1549 10/13/14 2032 10/14/14 0451 10/14/14 0900  BP: 123/99 120/84 109/78 120/82  Pulse: 88 90 85 92  Temp: 98 F (36.7 C) 98.2 F (36.8 C) 97.9 F (36.6 C) 98 F (36.7 C)  TempSrc: Oral Oral Oral Oral  Resp: 17 18 17 18   Height:      Weight:  65.8 kg (145 lb 1 oz)    SpO2: 97%  100% 100% 98%    Exam:  General:  Pt is alert, not in acute distress  HEENT: No icterus, No thrush, oral mucosa moist  Cardiovascular: regular rate and rhythm, S1/S2 No murmur  Respiratory: clear to auscultation bilaterally   Abdomen: Soft, +Bowel sounds, diffusely tender, non distended, no guarding  MSK: No LE edema, cyanosis or clubbing  Neurology: Numbness in bilateral lower extremities-strength 5 over 5 throughout-cranial nerves II through XII intact  Data Reviewed: Basic Metabolic Panel:  Recent Labs Lab 10/08/14 1906 10/09/14 0703 10/12/14 1150 10/13/14 0552 10/14/14 0458  NA 138 135 136 137 137  K 3.5 3.9 3.1* 3.4* 3.6  CL 107 104 101 106 105  CO2 25 24 27 26 26   GLUCOSE 94 104* 86 83 83  BUN 7 5* <5* <5* <5*  CREATININE 0.55 0.48 0.73 0.74 0.74  CALCIUM 8.8* 8.8* 9.1 8.6* 8.1*  MG  --   --   --  1.4* 1.8   Liver Function Tests:  Recent Labs Lab 10/08/14 1906 10/09/14 0703  AST 16 20  ALT 9* 10*  ALKPHOS 39 42  BILITOT 0.6 0.6  PROT 7.6 7.9  ALBUMIN 4.2 4.4    Recent Labs Lab 10/08/14 1906  LIPASE 23   No results for input(s): AMMONIA in the last 168 hours. CBC:  Recent Labs Lab 10/08/14 1906 10/09/14 0703 10/12/14 1150  WBC 8.2 10.2 8.4  NEUTROABS 3.7  --   --   HGB 13.0 12.9 12.3  HCT 36.7 36.6 35.3*  MCV 81.6 81.5 80.4  PLT 234 221 186   Cardiac Enzymes:  Recent Labs Lab 10/09/14 1440  CKTOTAL 90   BNP (last 3 results) No results for input(s): BNP in the last 8760 hours.  ProBNP (last 3 results) No results for input(s): PROBNP in the last 8760 hours.  CBG: No results for input(s): GLUCAP in the last 168 hours.  No results found for this or any previous visit (from the past 240 hour(s)).   Studies: No results found.  Scheduled Meds:  Scheduled Meds: . amitriptyline  25 mg Oral QHS  . bisacodyl  10 mg Rectal Once  . cyanocobalamin  1,000 mcg Subcutaneous Q0600  . enoxaparin (LOVENOX) injection  40 mg  Subcutaneous Q24H  . folic acid  1 mg Oral Daily  . gi cocktail  30 mL Oral Once  . methocarbamol (ROBAXIN)  IV  500 mg Intravenous 3 times per day  . nicotine  21 mg Transdermal Daily  . ondansetron  4 mg Oral Q6H   Or  . ondansetron (ZOFRAN) IV  4 mg Intravenous Q6H  . pantoprazole  40 mg Oral Daily  . promethazine  25 mg Intravenous Q6H  . sodium chloride  10-40 mL Intracatheter Q12H  . vitamin B-12  5,000 mcg Oral Daily   Continuous Infusions:    Time spent on care of this patient: 35 min   Tanya Krause,SAIMA, MD 10/14/2014, 2:20 PM  LOS:  5 days   [facility] Office  [phone] Pager - Text Page per [email or web address] If 7PM-7AM, please contact night-coverage [email or web address]

## 2014-10-14 NOTE — Progress Notes (Signed)
Physical Therapy Treatment Patient Details Name: Tanya Krause MRN: [id number] DOB: Feb 19, 1985 Today's Date: 10/14/2014    History of Present Illness Tanya Krause is a 29 y.o. female with a history of fibromyalgia who presents with a complaint of bilateral lower extremity pain followed by numbness and tingling and subsequently followed by weakness. Symptoms started about 1 week ago. Soon after the symptoms started, the patient became nauseated and started vomiting. MDs are considering B12 deficiency as source of symptoms;  has a past medical history of Hypertension; Migraine; Asthma; Pelvic inflammatory disease (PID); Sickle cell disease; Fibromyalgia; and Chronic abdominal pain.    PT Comments    Making slow progress, but Tanya Krause is motivated and seems happy with every bit of progress she makes; Noted she is not a candidate for CIR, and given her insurance, would not get therapies at SNF -- she is therefore opting to go home; would recommend HHPT, HHRN, HHOT  Follow Up Recommendations  Home health PT;Supervision - Intermittent  Does a Letter of Guaranty exist to provide [facility] serivces? I'm not sure that her insurance pays for [facility] therapies.      Equipment Recommendations  Rolling walker with 5" wheels;3in1 (PT);Wheelchair (measurements PT);Wheelchair cushion (measurements PT)    Recommendations for Other Services OT consult     Precautions / Restrictions Precautions Precautions: Fall    Mobility  Bed Mobility Overal bed mobility: Needs Assistance Bed Mobility: Sit to Supine       Sit to supine: Min assist   General bed mobility comments: Assist to help LEs into bed  Transfers Overall transfer level: Needs assistance Equipment used: Rolling walker (2 wheeled) Transfers: Sit to/from Stand Sit to Stand: Min guard (without physical contact)         General transfer comment: Very slow moving; cues for hand placement and safety; very  independent-minded  Ambulation/Gait Ambulation/Gait assistance: Min guard (without physical contact) Ambulation Distance (Feet): 12 Feet Assistive device: Rolling walker (2 wheeled) Gait Pattern/deviations: Shuffle;Decreased step length - right;Decreased step length - left (decr foot clearance bilaterally)   Gait velocity interpretation: Below normal speed for age/gender General Gait Details: Very slow moving; heavily dependent on RW use; Noted bilateral LE shaking which increased with fatigue; did not throw up after this walk   Stairs            Wheelchair Mobility    Modified Rankin (Stroke Patients Only)       Balance             Standing balance-Leahy Scale: Poor                      Cognition Arousal/Alertness: Awake/alert Behavior During Therapy: WFL for tasks assessed/performed Overall Cognitive Status: Within Functional Limits for tasks assessed                      Exercises      General Comments        Pertinent Vitals/Pain Pain Assessment: Faces Faces Pain Scale: Hurts even more Pain Location: Bil LEs, mostly knees Pain Descriptors / Indicators: Aching;Burning Pain Intervention(s): Limited activity within patient's tolerance;Monitored during session;Premedicated before session;Repositioned    Home Living                      Prior Function            PT Goals (current goals can now be found in the care plan section) Acute Rehab PT  Goals Patient Stated Goal: Hopes to find a job and be able to care for her 29 yo and 30yo PT Goal Formulation: With patient Time For Goal Achievement: 10/27/14 Potential to Achieve Goals: Good Progress towards PT goals: Progressing toward goals    Frequency  Min 3X/week    PT Plan Discharge plan needs to be updated    Co-evaluation             End of Session Equipment Utilized During Treatment: Gait belt Activity Tolerance: Patient limited by pain;Patient limited by  fatigue Patient left: in bed;with call bell/phone within reach     Time: 1202-1230 PT Time Calculation (min) (ACUTE ONLY): 28 min  Charges:  $Gait Training: 8-22 mins $Therapeutic Activity: 8-22 mins                    G Codes:      Olen Pel 10/14/2014, 2:46 PM  Van Clines,   Acute Rehabilitation Services Pager [phone] Office [phone]

## 2014-10-14 NOTE — Care Management Note (Signed)
Case Management Note  Patient Details  Name: Tanya Krause MRN: [id number] Date of Birth: 05-30-1985  Subjective/Objective:                CM following for progression and d/c planning.    Action/Plan: Spoke with PT, Lance Sell re pt d/c needs, this pt needs HHPT however her insurance does not cover this , she is also requesting HH services which are not covered. PT is recommending outpt PT and the pt is in agreement , she as states that she has transportation to get to outpt PT services. This CM will ask for orders and arrange at [facility] as the pt lives in [place]. Will order rollator per PT recommendation as pt has only walked 60ft at this point.   Expected Discharge Date:                  Expected Discharge Plan:  Home/Self Care  In-House Referral:  NA  Discharge planning Services  Follow-up appt scheduled  Post Acute Care Choice:  NA Choice offered to:  NA  DME Arranged:  Walker rolling with seat DME Agency:  [facility]  HH Arranged:    HH Agency:     Status of Service:  Completed, signed off  Medicare Important Message Given:    Date Medicare IM Given:    Medicare IM give by:    Date Additional Medicare IM Given:    Additional Medicare Important Message give by:     If discussed at Long Length of Stay Meetings, dates discussed:    Additional Comments:  [name], RN 10/14/2014, 4:33 PM

## 2014-10-15 LAB — BASIC METABOLIC PANEL
Anion gap: 3 — ABNORMAL LOW (ref 5–15)
CALCIUM: 8.6 mg/dL — AB (ref 8.9–10.3)
CHLORIDE: 108 mmol/L (ref 101–111)
CO2: 27 mmol/L (ref 22–32)
CREATININE: 0.66 mg/dL (ref 0.44–1.00)
GFR calc Af Amer: >60 mL/min (ref >60)
GFR calc non Af Amer: >60 mL/min (ref >60)
Glucose, Bld: 112 mg/dL — ABNORMAL HIGH (ref 65–99)
Potassium: 4.1 mmol/L (ref 3.5–5.1)
SODIUM: 138 mmol/L (ref 135–145)

## 2014-10-15 LAB — CREATININE, SERUM: CREATININE: 0.74 mg/dL (ref 0.44–1.00)

## 2014-10-15 LAB — MAGNESIUM: MAGNESIUM: 1.9 mg/dL (ref 1.7–2.4)

## 2014-10-15 MED ORDER — MAGNESIUM OXIDE 400 (241.3 MG) MG PO TABS
400.0000 mg | ORAL_TABLET | Freq: Two times a day (BID) | ORAL | Status: DC
  Administered 2014-10-15 – 2014-10-16 (×4): 400 mg via ORAL
  Filled 2014-10-15 (×3): qty 1

## 2014-10-15 MED ORDER — DIPHENHYDRAMINE HCL 25 MG PO CAPS
25.0000 mg | ORAL_CAPSULE | Freq: Four times a day (QID) | ORAL | Status: DC | PRN
  Administered 2014-10-15: 25 mg via ORAL
  Filled 2014-10-15 (×2): qty 1

## 2014-10-15 MED ORDER — GABAPENTIN 300 MG PO CAPS
300.0000 mg | ORAL_CAPSULE | Freq: Three times a day (TID) | ORAL | Status: DC
  Administered 2014-10-15 – 2014-10-16 (×3): 300 mg via ORAL
  Filled 2014-10-15 (×3): qty 1

## 2014-10-15 NOTE — Clinical Social Work Note (Addendum)
Clinical Social Work Assessment  Patient Details  Name: Tanya Krause MRN: [id number] Date of Birth: 07-23-1985  Date of referral:  10/13/14               Reason for consult:  Facility Placement                Permission sought to share information with:  Facility Medical sales representative (Conversation entailed patient going to a skilled facility for ST rehab) Permission granted to share information::  Yes, Verbal Permission Granted  Name::      Skilled nursing faciities  Agency::     Relationship::     Contact Information:     Housing/Transportation Living arrangements for the past 2 months:  Single Family Home (Patient and her children live with her parents) Source of Information:  Patient Patient Interpreter Needed:  None Criminal Activity/Legal Involvement Pertinent to Current Situation/Hospitalization:  No - Comment as needed Significant Relationships:  Parents, Dependent Children Lives with:  Parents Do you feel safe going back to the place where you live?  Yes Need for family participation in patient care:  Yes (Comment)  Care giving concerns:  Patient reported that she has good family support that includes her parents, grandmother and other family.   Social Worker assessment / plan:  On 10/13/14, CSW talked with patient regarding discharge plans and SNF versus ST rehab. CSW talked with patient regarding skilled facility placement for ST rehab and informed her that because of her insurance (Medicaid only), she will not receive rehab at a skilled facility, only nursing services, and that she may have to go outside of [facility] to a nursing facility. Patient expressed that she does not want to go outside of the county due to her children and feels that she has the family support (parents, grandmother, aunts, cousins) in order to discharge home. 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 CSW and patient also discussed  her medical conditions and Tanya Krause reported that she has applied for disability before and was denied. Patient feels comfortable that she will have the help she needs when she gets home.  Employment status:  Other (Comment) (Patient expressed that she wants to work, but due to her illnesses, has not been able to get a job.) Insurance information:  [place]icaid [place] (Patient receives Medicaid for herself and her 2 children, ages 48 and 5.) PT Recommendations:  Inpatient Rehab Consult, Home with Home Health Information / Referral to community resources:  Other (Comment Required) (None needed or requested at this time)  Patient/Family's Response to care:  No concerns expressed.  Patient/Family's Understanding of and Emotional Response to Diagnosis, Current Treatment, and Prognosis:  Not discussed.  Emotional Assessment Appearance:  Appears stated age Attitude/Demeanor/Rapport:  Other (Appropriate) Affect (typically observed):  Pleasant, Appropriate Orientation:  Oriented to Self, Oriented to Place, Oriented to  Time, Oriented to Situation Alcohol / Substance use:  Tobacco Use, Alcohol Use (Patient reports that she smokes cigarettes and drinks alcohol, but does not use illicit drugs.) Psych involvement (Current and /or in the community):     Discharge Needs  Concerns to be addressed:  Discharge Planning Concerns Readmission within the last 30 days:  No Current discharge risk:  None Barriers to Discharge:  No Barriers Identified   Cristobal Goldmann, LCSW 10/15/2014, 3:54 PM

## 2014-10-15 NOTE — Progress Notes (Signed)
Occupational Therapy Evaluation Patient Details Name: Tanya Krause MRN: [id number] DOB: March 11, 1985 Today's Date: 10/15/2014    History of Present Illness Tanya Krause is a 29 y.o. female with a history of fibromyalgia who presents with a complaint of bilateral lower extremity pain followed by numbness and tingling and subsequently followed by weakness. Symptoms started about 1 week ago. Soon after the symptoms started, the patient became nauseated and started vomiting. MDs are considering B12 deficiency as source of symptoms;  has a past medical history of Hypertension; Migraine; Asthma; Pelvic inflammatory disease (PID); Sickle cell disease; Fibromyalgia; and Chronic abdominal pain.   Clinical Impression   PTA, pt independent with ADL and mobility. Pt currently requires mod A for LB ADL and min A for mobility and RW level. Will plan to see tomorrow to address use of AE and DME for ADL to facilitate safe D/C home with initial 24/7 S and HHOT.     Follow Up Recommendations  Home health OT;Supervision/Assistance - 24 hour    Equipment Recommendations  3 in 1 bedside comode;Tub/shower bench    Recommendations for Other Services       Precautions / Restrictions Precautions Precautions: Fall      Mobility Bed Mobility Overal bed mobility: Needs Assistance Bed Mobility: Supine to Sit     Supine to sit: Supervision;HOB elevated (very slowly to EOB) Sit to supine: Supervision;HOB elevated   General bed mobility comments: moved entirely in long sitting  Transfers Overall transfer level: Needs assistance Equipment used: Rolling walker (2 wheeled)   Sit to Stand: Min guard         General transfer comment: Very slow moving; cues for hand placement and safety    Balance             Standing balance-Leahy Scale: Poor                              ADL Overall ADL's : Needs assistance/impaired     Grooming: Set up   Upper Body Bathing: Set up    Lower Body Bathing: Moderate assistance;Sit to/from stand   Upper Body Dressing : Set up   Lower Body Dressing: Moderate assistance;Sit to/from stand   Toilet Transfer: Minimal assistance;Stand-pivot   Toileting- Architect and Hygiene: Supervision/safety;Set up       Functional mobility during ADLs: Minimal assistance;Rolling walker General ADL Comments: to get EOB, pt rotating around using B hands to boost buttocks off bed and swing legs around slowly. Pt staes she does this because of pain and weakness. Pt however able to stand at EOB with minguard A and take 2 . May benefit from AE for LB ADL. discusssed recommendation of tub bench and 3 in 1     Vision     Perception     Praxis      Pertinent Vitals/Pain Pain Assessment: 0-10 Pain Score: 9  Pain Location: B Knees  Limited participation within pain tolerance; heat on B knees     Hand Dominance     Extremity/Trunk Assessment Upper Extremity Assessment Upper Extremity Assessment: Overall WFL for tasks assessed   Lower Extremity Assessment Lower Extremity Assessment: Defer to PT evaluation   Cervical / Trunk Assessment Cervical / Trunk Assessment: Normal   Communication Communication Communication: No difficulties   Cognition Arousal/Alertness: Awake/alert Behavior During Therapy: WFL for tasks assessed/performed Overall Cognitive Status: Within Functional Limits for tasks assessed  General Comments       Exercises       Shoulder Instructions      Home Living Family/patient expects to be discharged to:: Private residence Living Arrangements: Parent Available Help at Discharge: Family;Available PRN/intermittently Type of Home: House Home Access: Stairs to enter [employer] of Steps: 1 Entrance Stairs-Rails: None Home Layout: One level     Bathroom Shower/Tub: Tub/shower unit Shower/tub characteristics: Engineer, building services: Standard Bathroom  Accessibility: Yes How Accessible: Accessible via walker Home Equipment: None          Prior Functioning/Environment Level of Independence: Independent             OT Diagnosis: Generalized weakness;Acute pain   OT Problem List: Decreased strength;Decreased range of motion;Decreased activity tolerance;Impaired balance (sitting and/or standing);Decreased knowledge of use of DME or AE;Pain   OT Treatment/Interventions: Self-care/ADL training;Therapeutic exercise;DME and/or AE instruction;Therapeutic activities;Patient/family education;Balance training    OT Goals(Current goals can be found in the care plan section) Acute Rehab OT Goals Patient Stated Goal: to go home OT Goal Formulation: With patient Time For Goal Achievement: 10/29/14 Potential to Achieve Goals: Good ADL Goals Pt Will Perform Lower Body Bathing: with supervision;with set-up;with caregiver independent in assisting;with adaptive equipment;sit to/from stand Pt Will Perform Lower Body Dressing: with supervision;with set-up;with caregiver independent in assisting;with adaptive equipment;sit to/from stand Pt Will Transfer to Toilet: stand pivot transfer;bedside commode;with min guard assist (with caregiver independent in assisting) Pt Will Perform Toileting - Clothing Manipulation and hygiene: with supervision;sit to/from stand  OT Frequency: Min 3X/week   Barriers to D/C:            Co-evaluation              End of Session Equipment Utilized During Treatment: Engineer, water Communication: Mobility status  Activity Tolerance: Patient tolerated treatment well Patient left: in bed;with call bell/phone within reach;Other (comment);with bed alarm set (4 rails left up for safety. Pt fears that she will fall out of bed )   Time: 4270-6237 OT Time Calculation (min): 19 min Charges:  OT General Charges $OT Visit: 1 Procedure OT Evaluation $Initial OT Evaluation Tier I: 1 Procedure G-Codes:     WARD,HILLARY November 03, 2014, 5:08 PM   [facility], OTR/L  [phone]/11/03 Hilary Ward, OTR/L  [phone] November 03, 2014

## 2014-10-15 NOTE — Progress Notes (Signed)
[facility]                                                                              Patient Demographics  Tanya Krause, is a 29 y.o. female, DOB - [birth year], LKH:[id number]  Admit date - 10/08/2014   Admitting Physician Meredeth Ide, MD  Outpatient Primary MD for the patient is Default, Provider, MD  LOS - 6   Chief Complaint  Patient presents with  . Sickle Cell Pain Crisis       Brief HPI   Tanya Krause is a 29 y.o. female with a history of fibromyalgia who presents with a complaint of bilateral lower extremity pain followed by numbness and tingling and subsequently followed by weakness. Symptoms started about 1 week ago. Soon after the symptoms started, the patient became nauseated and started vomiting. She attributes the GI symptoms to the pain in her legs. No complaint of diarrhea. She has diffuse abdominal tenderness. No fever or chills.    Assessment & Plan    Principal Problem: Bilateral Lower extremity weakness/ Vitamin B12 deficiency: Bilateral lower extremity pain, numbness, tingling likely secondary to severe B-12 deficiency. -B-12 level 91 on admission-has improved with replacement and is now elevated -MRI of the lumbar and thoracic spine is negative for cord compression  -Intrinsic factor and antiparietal antibodies negative, she is not a vegetarian -Dr. Kerry Hough discussed with neurology-she may have idiopathic B-12 deficiency - recommended we start Neurontin instead of narcotics for pain. Patient was started on Neurontin at a dose of 100 mg 3 times a day, will increase to 300 TID -monitor for increasing somnolence. - Patient has been requesting for round-the-clock IV Dilaudid and oral narcotics (? Monitor for pain medication seeking behavior). I will discontinue IV Dilaudid, continue oral narcotics as needed for now to wean slowly.   Active Problems:  Abdominal pain/ Nausea & vomiting -No history of peptic ulcer disease. No  hematemesis. No sick contacts and no history of ingesting fast food prior to symptoms-no fever or chills and no diarrhea -CT abdomen showed no acute inflammatory process, normal appendix. No SBO.  -wean down narcotics, continue antiemetics, PPI - Tolerating diet without difficulty.   Hypokalemia, hypomagnesemia -Currently stable, recheck BMET - Placed on oral magnesium   Fibromyalgia and chronic pain syndrome -States she is on Robaxin for this (muscle cramping?)-Will continue via IV - Since patient is tolerating diet, will continue oral narcotics, takes Percocet as an outpatient. She will need to follow up with Pain clinic.   Anxiety state -Have changed oral Xanax to IV Ativan  Cigarette smoker -Have counseled her-continue nicotine patch  Sickle cell anemia? -Does not have anemia -Hemoglobin electrophoresis showed hemoglobin C trait  Code Status: Full code  Family Communication: Discussed in detail with the patient, all imaging results, lab results explained to the patient    Disposition Plan: Discussed in detail with the patient, PT evaluation recommended home health PT. Per case management, will be approved for outpatient PT rehabilitation at [facility]. She has been noted as not a candidate for CIR. She wants to be discharged on Friday as she states that her parents  are not at home. Possible DC in a.m. if medically improved.  Time Spent in minutes  25 minutes  Procedures  MRI of the lumbar spine, thoracic spine   Consults   None  DVT Prophylaxis Lovenox  Medications  Scheduled Meds: . amitriptyline  25 mg Oral QHS  . bisacodyl  10 mg Rectal Once  . cyanocobalamin  1,000 mcg Subcutaneous Q0600  . enoxaparin (LOVENOX) injection  40 mg Subcutaneous Q24H  . feeding supplement (ENSURE ENLIVE)  237 mL Oral TID BM  . folic acid  1 mg Oral Daily  . gabapentin  100 mg Oral TID  . gi cocktail  30 mL Oral Once  . methocarbamol (ROBAXIN)  IV  500 mg Intravenous  3 times per day  . nicotine  21 mg Transdermal Daily  . ondansetron  4 mg Oral Q6H   Or  . ondansetron (ZOFRAN) IV  4 mg Intravenous Q6H  . pantoprazole  40 mg Oral Daily  . promethazine  25 mg Intravenous Q6H  . sodium chloride  10-40 mL Intracatheter Q12H  . vitamin B-12  1,000 mcg Oral Daily   Continuous Infusions:  PRN Meds:.acetaminophen **OR** acetaminophen, albuterol, butalbital-acetaminophen-caffeine, diphenhydrAMINE, LORazepam, oxyCODONE-acetaminophen **AND** oxyCODONE, sodium chloride   Antibiotics   Anti-infectives    None        Subjective:   Tanya Krause was seen and examined today. Feels better today, however somewhat upset after mentioning disposition plans. She states that she still has numbness and weakness in her lower legs. No fevers or chills or any diarrhea. Tolerating regular diet without any difficulty.   Objective:   Blood pressure 115/79, pulse 89, temperature 98 F (36.7 C), temperature source Oral, resp. rate 18, height 5' (1.524 m), weight 65.681 kg (144 lb 12.8 oz), last menstrual period 10/02/2014, SpO2 100 %.  Wt Readings from Last 3 Encounters:  10/14/14 65.681 kg (144 lb 12.8 oz)  09/09/14 77.111 kg (170 lb)  08/11/14 75.751 kg (167 lb)     Intake/Output Summary (Last 24 hours) at 10/15/14 1317 Last data filed at 10/15/14 1059  Gross per 24 hour  Intake 2167.17 ml  Output    700 ml  Net 1467.17 ml    Exam  General: Alert and oriented x 3, NAD  HEENT:  PERRLA, EOMI, Anicteric Sclera, mucous membranes moist.   Neck: Supple, no JVD, no masses  CVS: S1 S2 auscultated, no rubs, murmurs or gallops. Regular rate and rhythm.  Respiratory: Clear to auscultation bilaterally, no wheezing, rales or rhonchi  Abdomen: Soft, nontender, nondistended, + bowel sounds  Ext: no cyanosis clubbing or edema  Neuro: AAOx3, Cr N's II- XII. Strength 5/5 upper and lower extremities bilaterally  Skin: No rashes  Psych: Normal affect and  demeanor, alert and oriented x3    Data Review   Micro Results No results found for this or any previous visit (from the past 240 hour(s)).  Radiology Reports Mr Thoracic Spine Wo Contrast  10/12/2014   CLINICAL DATA:  29 year old female with 1 week of pain and tingling in the lower extremities with weakness. Increased difficulty walking. Reportedly B12 the levels are low. Initial encounter.  EXAM: MRI THORACIC AND LUMBAR SPINE WITHOUT CONTRAST  TECHNIQUE: Multiplanar and multiecho pulse sequences of the thoracic and lumbar spine were obtained without intravenous contrast.  COMPARISON:  Chest CTA 06/07/2010. CT Abdomen and Pelvis 10/10/2014, and earlier.  FINDINGS: MR THORACIC SPINE FINDINGS  Limited sagittal T1 weighted imaging of the cervical spine is unremarkable.  Mild levoconvex thoracic scoliosis, otherwise normal thoracic vertebral height and alignment. No marrow edema or evidence of acute osseous abnormality. Negative visualized posterior paraspinal soft tissues.  Negative visualized thoracic and upper abdominal viscera.  Visualized lower cervical and thoracic spinal cord signal and morphology is within normal limits; mild prominence of the central spinal canal suspected at the C6-C7 level, normal anatomic variant. Normal patency of the thoracic spinal canal. No significant thoracic disc degeneration.  MR LUMBAR SPINE FINDINGS  Normal lumbar segmentation. Minimal levoconvex scoliosis, otherwise normal vertebral height and alignment. No marrow edema or evidence of acute osseous abnormality.  Visualized lower thoracic spinal cord is normal with conus medularis at L1-L2. Cauda equina nerve roots appear normal.  The bladder is distended. Otherwise negative visualized abdominal and pelvic viscera. Negative visualized posterior paraspinal soft tissues.  No lumbar spinal stenosis. No significant lumbar disc degeneration. No lumbar foraminal stenosis.  IMPRESSION: Normal noncontrast MRI appearance of the  thoracic and lumbar spine.   Electronically Signed   By: Odessa Fleming M.D.   On: 10/12/2014 11:09   Mr Lumbar Spine Wo Contrast  10/12/2014   CLINICAL DATA:  29 year old female with 1 week of pain and tingling in the lower extremities with weakness. Increased difficulty walking. Reportedly B12 the levels are low. Initial encounter.  EXAM: MRI THORACIC AND LUMBAR SPINE WITHOUT CONTRAST  TECHNIQUE: Multiplanar and multiecho pulse sequences of the thoracic and lumbar spine were obtained without intravenous contrast.  COMPARISON:  Chest CTA 06/07/2010. CT Abdomen and Pelvis 10/10/2014, and earlier.  FINDINGS: MR THORACIC SPINE FINDINGS  Limited sagittal T1 weighted imaging of the cervical spine is unremarkable.  Mild levoconvex thoracic scoliosis, otherwise normal thoracic vertebral height and alignment. No marrow edema or evidence of acute osseous abnormality. Negative visualized posterior paraspinal soft tissues.  Negative visualized thoracic and upper abdominal viscera.  Visualized lower cervical and thoracic spinal cord signal and morphology is within normal limits; mild prominence of the central spinal canal suspected at the C6-C7 level, normal anatomic variant. Normal patency of the thoracic spinal canal. No significant thoracic disc degeneration.  MR LUMBAR SPINE FINDINGS  Normal lumbar segmentation. Minimal levoconvex scoliosis, otherwise normal vertebral height and alignment. No marrow edema or evidence of acute osseous abnormality.  Visualized lower thoracic spinal cord is normal with conus medularis at L1-L2. Cauda equina nerve roots appear normal.  The bladder is distended. Otherwise negative visualized abdominal and pelvic viscera. Negative visualized posterior paraspinal soft tissues.  No lumbar spinal stenosis. No significant lumbar disc degeneration. No lumbar foraminal stenosis.  IMPRESSION: Normal noncontrast MRI appearance of the thoracic and lumbar spine.   Electronically Signed   By: Odessa Fleming M.D.    On: 10/12/2014 11:09   Ct Abdomen Pelvis W Contrast  10/10/2014   CLINICAL DATA:  Abdominal pain, vomiting  EXAM: CT ABDOMEN AND PELVIS WITH CONTRAST  TECHNIQUE: Multidetector CT imaging of the abdomen and pelvis was performed using the standard protocol following bolus administration of intravenous contrast.  CONTRAST:  OMNIPAQUE IOHEXOL 300 MG/ML  SOLN  COMPARISON:  07/29/2014  FINDINGS: Sagittal images of the spine are unremarkable. Lung bases are unremarkable. Liver, pancreas, spleen and adrenal glands are unremarkable. No focal hepatic mass.  No aortic aneurysm.  Kidneys are symmetrical in size and enhancement. No hydronephrosis or hydroureter.  No small bowel obstruction.  No ascites or free air.  No adenopathy.  Normal appendix. No pericecal inflammation. The terminal ileum is unremarkable. The uterus and ovaries are unremarkable. The uterus  is anteflexed. Urinary bladder is unremarkable. No inguinal adenopathy. No pelvic ascites or adenopathy.  IMPRESSION: 1. No acute inflammatory process within abdomen or pelvis. 2. Normal appendix.  No pericecal inflammation. 3. No hydronephrosis or hydroureter. 4. No small bowel obstruction.   Electronically Signed   By: Natasha Mead M.D.   On: 10/10/2014 14:48   Dg Chest Port 1 View  10/11/2014   CLINICAL DATA:  Status post PICC line placement, history of sickle cell disease  EXAM: PORTABLE CHEST - 1 VIEW  COMPARISON:  08/11/2014  FINDINGS: Cardiac shadow is within normal limits. The lungs are clear bilaterally. No bony abnormality is noted. A new right-sided PICC line is noted with the tip at the cavoatrial junction in satisfactory position.  IMPRESSION: Satisfactory placement of right-sided PICC line.   Electronically Signed   By: Alcide Clever M.D.   On: 10/11/2014 19:29    CBC  Recent Labs Lab 10/08/14 1906 10/09/14 0703 10/12/14 1150  WBC 8.2 10.2 8.4  HGB 13.0 12.9 12.3  HCT 36.7 36.6 35.3*  PLT 234 221 186  MCV 81.6 81.5 80.4  MCH 28.9 28.7  28.0  MCHC 35.4 35.2 34.8  RDW 13.6 13.7 13.1  LYMPHSABS 3.5  --   --   MONOABS 0.8  --   --   EOSABS 0.3  --   --   BASOSABS 0.0  --   --     Chemistries   Recent Labs Lab 10/08/14 1906 10/09/14 0703 10/12/14 1150 10/13/14 0552 10/14/14 0458 10/15/14 0540  NA 138 135 136 137 137  --   K 3.5 3.9 3.1* 3.4* 3.6  --   CL 107 104 101 106 105  --   CO2 25 24 27 26 26   --   GLUCOSE 94 104* 86 83 83  --   BUN 7 5* <5* <5* <5*  --   CREATININE 0.55 0.48 0.73 0.74 0.74 0.74  CALCIUM 8.8* 8.8* 9.1 8.6* 8.1*  --   MG  --   --   --  1.4* 1.8 1.9  AST 16 20  --   --   --   --   ALT 9* 10*  --   --   --   --   ALKPHOS 39 42  --   --   --   --   BILITOT 0.6 0.6  --   --   --   --    ------------------------------------------------------------------------------------------------------------------ estimated creatinine clearance is 87.8 mL/min (by C-G formula based on Cr of 0.74). ------------------------------------------------------------------------------------------------------------------ No results for input(s): HGBA1C in the last 72 hours. ------------------------------------------------------------------------------------------------------------------ No results for input(s): CHOL, HDL, LDLCALC, TRIG, CHOLHDL, LDLDIRECT in the last 72 hours. ------------------------------------------------------------------------------------------------------------------ No results for input(s): TSH, T4TOTAL, T3FREE, THYROIDAB in the last 72 hours.  Invalid input(s): FREET3 ------------------------------------------------------------------------------------------------------------------  Recent Labs  10/14/14 0458  VITAMINB12 3394*    Coagulation profile  Recent Labs Lab 10/08/14 1906  INR 1.05    No results for input(s): DDIMER in the last 72 hours.  Cardiac Enzymes No results for input(s): CKMB, TROPONINI, MYOGLOBIN in the last 168 hours.  Invalid input(s):  CK ------------------------------------------------------------------------------------------------------------------ Invalid input(s): POCBNP  No results for input(s): GLUCAP in the last 72 hours.   Jaiquan Temme M.D. [facility] 10/15/2014, 1:17 PM  Pager: [phone] Between 7am to 7pm - call Pager - [phone]  After 7pm go to [email or web address] - password [email or web address]  Call night coverage person covering after 7pm

## 2014-10-15 NOTE — Progress Notes (Signed)
Received phone call from patients sister Gerarda Gunther who indicated that patient had called her and stated that she was having "problems with her nurse" today. Sister stated "I would hate to have to come all the way from [facility] to see what was going on". Discussed with the patients sister that DD would have a conversation with the patient to determine needs.   DD went in to speak with patient stated that she didn't feel that the nurse had a friendly attitude with her regarding her requests. Also stated that she needed medication for a migraine and itching. DD reviewed MAR and previously administered medications today. Informed patient that medications have been administered as ordered and that DD would ensure that migraine medication and itching medication would be administered as ordered. Caple, Charlyne Quale

## 2014-10-15 NOTE — Progress Notes (Signed)
Physical Therapy Treatment Patient Details Name: Tanya Krause MRN: [id number] DOB: May 05, 1985 Today's Date: 10/15/2014    History of Present Illness ZEPHYRA BERNARDI is a 29 y.o. female with a history of fibromyalgia who presents with a complaint of bilateral lower extremity pain followed by numbness and tingling and subsequently followed by weakness. Symptoms started about 1 week ago. Soon after the symptoms started, the patient became nauseated and started vomiting. MDs are considering B12 deficiency as source of symptoms;  has a past medical history of Hypertension; Migraine; Asthma; Pelvic inflammatory disease (PID); Sickle cell disease; Fibromyalgia; and Chronic abdominal pain.    PT Comments    Slow progress with mobility, but still progress; We discussed dc plans and therapy follow-up that her insurance would provide -- it is unlikely that her insurance will cover HHPT, therefore we discussed Outpatient PT, and Case Mgr set this up with [facility];   Recommend pt get follow up at a Chronic Pain Clinic, or perhaps the Sickle Cell Clinic (?); MD, please consider peer-to-peer referral.  Follow Up Recommendations  Outpatient PT;Supervision - Intermittent     Equipment Recommendations   (Rollator RW)    Recommendations for Other Services       Precautions / Restrictions Precautions Precautions: Fall    Mobility  Bed Mobility Overal bed mobility: Needs Assistance Bed Mobility: Supine to Sit     Supine to sit: Supervision;HOB elevated Sit to supine: Supervision;HOB elevated   General bed mobility comments: moved entirely in long sitting; Able to push up through UEs to move hips  Transfers Overall transfer level: Needs assistance Equipment used: Rolling walker (2 wheeled) Transfers: Sit to/from Stand Sit to Stand: Supervision         General transfer comment: Very slow moving; cues for hand placement and safety  Ambulation/Gait Ambulation/Gait  assistance: Supervision;Min guard Ambulation Distance (Feet): 20 Feet (to/from bathroom) Assistive device: Rolling walker (2 wheeled) Gait Pattern/deviations: Shuffle;Decreased step length - right;Decreased step length - left;Step-to pattern   Gait velocity interpretation: Below normal speed for age/gender General Gait Details: Very slow moving; heavily dependent on RW use; Noted bilateral LE shaking which increased with fatigue; Able to stop and stand in Rw, open a drawer and get and apply powder walking back to bed   Stairs            Wheelchair Mobility    Modified Rankin (Stroke Patients Only)       Balance             Standing balance-Leahy Scale: Fair                      Cognition Arousal/Alertness: Awake/alert Behavior During Therapy: WFL for tasks assessed/performed Overall Cognitive Status: Within Functional Limits for tasks assessed                      Exercises      General Comments        Pertinent Vitals/Pain Pain Assessment: 0-10 Pain Score: 10-Worst pain ever Pain Location: Migraine Headache Pain Descriptors / Indicators: Headache Pain Intervention(s): Limited activity within patient's tolerance (lights low)    Home Living Family/patient expects to be discharged to:: Private residence Living Arrangements: Parent Available Help at Discharge: Family;Available PRN/intermittently Type of Home: House Home Access: Stairs to enter Entrance Stairs-Rails: None Home Layout: One level Home Equipment: None      Prior Function Level of Independence: Independent  PT Goals (current goals can now be found in the care plan section) Acute Rehab PT Goals Patient Stated Goal: to go home PT Goal Formulation: With patient Time For Goal Achievement: 10/27/14 Potential to Achieve Goals: Good Progress towards PT goals: Progressing toward goals    Frequency  Min 3X/week    PT Plan Discharge plan needs to be updated     Co-evaluation             End of Session   Activity Tolerance: Patient limited by pain;Patient limited by fatigue Patient left: in bed;with call bell/phone within reach;Other (comment) (with Nursing Director in room)     Time: [phone] PT Time Calculation (min) (ACUTE ONLY): 26 min  Charges:  $Gait Training: 8-22 mins $Therapeutic Activity: 8-22 mins                    G Codes:      Olen Pel 10/15/2014, 5:19 PM  Van Clines,   Acute Rehabilitation Services Pager [phone] Office [phone]

## 2014-10-15 NOTE — Progress Notes (Signed)
Patient did not voice any complaints to me . She has asked for her medication frequently and her medication were given on time. DD of unit had talk with patient /request. i have met patients needs medication was given so PT could work with patient and all of this was explained to patient and all care was explained before i gave any care and meddications   

## 2014-10-15 NOTE — Care Management Note (Signed)
Case Management Note  Patient Details  Name: Tanya Krause MRN: [id number] Date of Birth: [birth year]  Subjective/Objective:         CM following for progression and d/c planning.           Action/Plan: 10/15/2014 Pt eval by CIR for comprehensive inpt rehab , however this pt is not an appropriate candidate per CIR RN liason B Boyette.  Plan now to return to home with plan for outpatient PT to be arranged at [facility].   Expected Discharge Date:       10/16/2014           Expected Discharge Plan:  Home/Self Care  In-House Referral:  NA  Discharge planning Services  Follow-up appt scheduled  Post Acute Care Choice:  NA Choice offered to:  NA  DME Arranged:  Walker rolling with seat DME Agency:  [facility]  HH Arranged:    [facility] Agency:     Status of Service:  Completed, signed off  Medicare Important Message Given:    Date Medicare IM Given:    Medicare IM give by:    Date Additional Medicare IM Given:    Additional Medicare Important Message give by:     If discussed at Long Length of Stay Meetings, dates discussed:    Additional Comments:  [name], RN 10/15/2014, 1:32 PM

## 2014-10-16 LAB — BASIC METABOLIC PANEL
ANION GAP: 6 (ref 5–15)
BUN: <5 mg/dL — ABNORMAL LOW (ref 6–20)
CHLORIDE: 103 mmol/L (ref 101–111)
CO2: 28 mmol/L (ref 22–32)
Calcium: 8.8 mg/dL — ABNORMAL LOW (ref 8.9–10.3)
Creatinine, Ser: 0.62 mg/dL (ref 0.44–1.00)
GFR calc non Af Amer: >60 mL/min (ref >60)
GLUCOSE: 85 mg/dL (ref 65–99)
POTASSIUM: 3.8 mmol/L (ref 3.5–5.1)
Sodium: 137 mmol/L (ref 135–145)

## 2014-10-16 MED ORDER — HYDROMORPHONE HCL 1 MG/ML IJ SOLN
INTRAMUSCULAR | Status: AC
  Filled 2014-10-16: qty 1

## 2014-10-16 MED ORDER — AMITRIPTYLINE HCL 25 MG PO TABS: 25.0000 mg | ORAL_TABLET | Freq: Every day | ORAL | Status: DC

## 2014-10-16 MED ORDER — OXYCODONE-ACETAMINOPHEN 5-325 MG PO TABS: 1.0000 | ORAL_TABLET | ORAL | Status: DC | PRN

## 2014-10-16 MED ORDER — MAGNESIUM OXIDE 400 (241.3 MG) MG PO TABS: 400.0000 mg | ORAL_TABLET | Freq: Two times a day (BID) | ORAL | Status: DC

## 2014-10-16 MED ORDER — DIPHENHYDRAMINE HCL 25 MG PO CAPS: 25.0000 mg | ORAL_CAPSULE | Freq: Four times a day (QID) | ORAL | Status: DC | PRN

## 2014-10-16 MED ORDER — CYCLOBENZAPRINE HCL 5 MG PO TABS
5.0000 mg | ORAL_TABLET | Freq: Three times a day (TID) | ORAL | Status: DC
  Administered 2014-10-16: 5 mg via ORAL
  Filled 2014-10-16: qty 1

## 2014-10-16 MED ORDER — FOLIC ACID 1 MG PO TABS: 1.0000 mg | ORAL_TABLET | Freq: Every day | ORAL | Status: DC

## 2014-10-16 MED ORDER — UNABLE TO FIND: Status: DC

## 2014-10-16 MED ORDER — BUTALBITAL-APAP-CAFFEINE 50-325-40 MG PO TABS: 2.0000 | ORAL_TABLET | Freq: Four times a day (QID) | ORAL | Status: DC | PRN

## 2014-10-16 MED ORDER — POLYETHYLENE GLYCOL 3350 17 G PO PACK: 17.0000 g | PACK | Freq: Every day | ORAL | Status: DC

## 2014-10-16 MED ORDER — GABAPENTIN 300 MG PO CAPS: 300.0000 mg | ORAL_CAPSULE | Freq: Three times a day (TID) | ORAL | Status: DC

## 2014-10-16 MED ORDER — CYCLOBENZAPRINE HCL 10 MG PO TABS: 10.0000 mg | ORAL_TABLET | Freq: Three times a day (TID) | ORAL | Status: DC | PRN

## 2014-10-16 MED ORDER — OXYCODONE HCL 5 MG PO TABS: 5.0000 mg | ORAL_TABLET | ORAL | Status: DC | PRN

## 2014-10-16 MED ORDER — HYDROMORPHONE HCL 1 MG/ML IJ SOLN
0.5000 mg | Freq: Once | INTRAMUSCULAR | Status: AC
Start: 2014-10-16 — End: 2014-10-16
  Administered 2014-10-16: 0.5 mg via INTRAVENOUS

## 2014-10-16 MED ORDER — PANTOPRAZOLE SODIUM 40 MG PO TBEC
40.0000 mg | DELAYED_RELEASE_TABLET | Freq: Every day | ORAL | Status: DC
Start: 2014-10-16 — End: 2014-10-17

## 2014-10-16 MED ORDER — NICOTINE 21 MG/24HR TD PT24: 21.0000 mg | MEDICATED_PATCH | Freq: Every day | TRANSDERMAL | Status: DC

## 2014-10-16 MED ORDER — ENSURE ENLIVE PO LIQD: 237.0000 mL | Freq: Three times a day (TID) | ORAL | Status: DC

## 2014-10-16 MED ORDER — CYANOCOBALAMIN 1000 MCG PO TABS: 1000.0000 ug | ORAL_TABLET | Freq: Every day | ORAL | Status: DC

## 2014-10-16 MED ORDER — PROMETHAZINE HCL 25 MG PO TABS: 25.0000 mg | ORAL_TABLET | Freq: Four times a day (QID) | ORAL | Status: DC | PRN

## 2014-10-16 NOTE — Progress Notes (Addendum)
Occupational Therapy Treatment Patient Details Name: Tanya Krause MRN: [id number] DOB: 1985/09/11 Today's Date: 10/16/2014    History of present illness Tanya Krause is a 29 y.o. female with a history of fibromyalgia who presents with a complaint of bilateral lower extremity pain followed by numbness and tingling and subsequently followed by weakness. Symptoms started about 1 week ago. Soon after the symptoms started, the patient became nauseated and started vomiting. MDs are considering B12 deficiency as source of symptoms;  has a past medical history of Hypertension; Migraine; Asthma; Pelvic inflammatory disease (PID); Sickle cell disease; Fibromyalgia; and Chronic abdominal pain.   OT comments  Completed education regarding use of DME and AE for ADL. Relayed equipment needs to case manager. Pt would benefit form home health OT if possible.Anticipate D/C today. Recommend pt walk with staff @ RW level.   Follow Up Recommendations  Home health OT;Supervision/Assistance - 24 hour    Equipment Recommendations  3 in 1 bedside comode;Tub/shower bench    Recommendations for Other Services      Precautions / Restrictions Precautions Precautions: Fall       Mobility Bed Mobility Overal bed mobility: Modified Independent                Transfers Overall transfer level: Needs assistance Equipment used: Rolling walker (2 wheeled) Transfers: Sit to/from Stand Sit to Stand: Supervision         General transfer comment: Very slow moving; cues for hand placement and safety    Balance Overall balance assessment: Needs assistance           Standing balance-Leahy Scale: Fair                     ADL                                       Functional mobility during ADLs: Min guard;Rolling walker;Cueing for safety General ADL Comments: Educated pt on use of reacher for LB ADL. P able to donn/doff socks at bed level. Completed toilet transfer with  S. REcommended pt use 3 in 1 by bed at night and over the toilet during the day.       Vision                     Perception     Praxis      Cognition   Behavior During Therapy: WFL for tasks assessed/performed Overall Cognitive Status: Within Functional Limits for tasks assessed                       Extremity/Trunk Assessment               Exercises     Shoulder Instructions       General Comments      Pertinent Vitals/ Pain       Pain Assessment: 0-10 Pain Score: 8  Pain Location: B legs Pain Descriptors / Indicators: Aching Pain Intervention(s): Limited activity within patient's tolerance;Repositioned;Heat applied  Home Living                                          Prior Functioning/Environment              Frequency Min 3X/week  Progress Toward Goals  OT Goals(current goals can now be found in the care plan section)  Progress towards OT goals: Progressing toward goals  Acute Rehab OT Goals Patient Stated Goal: to go home OT Goal Formulation: With patient Time For Goal Achievement: 10/29/14 Potential to Achieve Goals: Good ADL Goals Pt Will Perform Lower Body Bathing: with supervision;with set-up;with caregiver independent in assisting;with adaptive equipment;sit to/from stand Pt Will Perform Lower Body Dressing: with supervision;with set-up;with caregiver independent in assisting;with adaptive equipment;sit to/from stand Pt Will Transfer to Toilet: stand pivot transfer;bedside commode;with min guard assist Pt Will Perform Toileting - Clothing Manipulation and hygiene: with supervision;sit to/from stand  Plan Discharge plan remains appropriate    Co-evaluation                 End of Session Equipment Utilized During Treatment: Rolling walker   Activity Tolerance Patient tolerated treatment well   Patient Left in bed;with call bell/phone within reach;Other (comment);with bed alarm set    Nurse Communication Mobility status        Time: [phone] OT Time Calculation (min): 28 min  Charges: OT General Charges $OT Visit: 1 Procedure OT Treatments $Self Care/Home Management : 23-37 mins  WARD,HILLARY 10/16/2014, 10:19 AM   Luisa Dago, OTR/L  [phone] 10/16/2014

## 2014-10-16 NOTE — Care Management Note (Addendum)
Case Management Note  Patient Details  Name: Tanya Krause MRN: [id number] Date of Birth: March 25, 1985  Subjective/Objective:            CM following for progression and d/c planning.        Action/Plan: Pt for d/c to home today 10/16/2014 , Outpatient PT ordered and will be scheduled at [facility].  Pt referred to [facility] in [place], Dr Cornell Barman, await appointment time.  Application for personal care services provide to pt per her request, pt instructed to complete form with assistance of primary care , which has been arranged at the [facility].  This may only be completed after pt is d/c from the hospital and with the help of PCP.   Expected Discharge Date:     10/16/2014             Expected Discharge Plan:  Home/Self Care  In-House Referral:  NA  Discharge planning Services  Follow-up appt scheduled  Post Acute Care Choice:  NA Choice offered to:  NA  DME Arranged:  Walker rolling with seat, tub bench, and 3:1 commode.  DME Agency:  [facility]  HH Arranged:   No eligible for [facility] services as the pt will be receiving outpatient PT. HH Agency:     Status of Service:  Completed, signed off  Medicare Important Message Given:    Date Medicare IM Given:    Medicare IM give by:    Date Additional Medicare IM Given:    Additional Medicare Important Message give by:     If discussed at Long Length of Stay Meetings, dates discussed:    Additional Comments:  [name], RN 10/16/2014, 10:31 AM

## 2014-10-16 NOTE — Progress Notes (Signed)
Patient requested to speak to CN due to feeling like she has received inadequate care today. Patient's complaints included feeling like she has not received adequate pain control today and that she is being ignored by staff including RN and NT. When looking at patient's [facility] she has received oxycodone at 0810, flexeril at 1055, gabapentin at 1055, and IV phenergan at 1142. Patient also stated she was told by Dr. Isidoro Donning that she would receive an extra dose of dilaudid either before or after working with PT today prior to discharge. Dr. Isidoro Donning paged, order received, and patient received dilaudid at 1221. Patient stated feeling satisfied after receiving the additional pain medication.   When questioned about her other complaints, she stated she had called the NT for assistance several times and the NT was unable to meet her needs. The NT stated she had only received 1 call from patient and she had informed  Ms. Leopard that she wouldn't be able to come into her room to assist with a bath for another 15-20 minutes. The patient was not satisfied with this response and felt like she should've been able to have assistance sooner. Attempted to resolve the situation. Patient appeared satisfied after the discussion.  Leanna Battles, RN.

## 2014-10-16 NOTE — Discharge Summary (Signed)
Physician Discharge Summary   Patient ID: ETHNE BENTS MRN: [id number] DOB/AGE: January 16, 1986 29 y.o.  Admit date: 10/08/2014 Discharge date: 10/16/2014  Primary Care Physician:  No PCP, patient had recently moved from [facility]  Discharge Diagnoses:    . lower extremity weakness due to vitamin B-12 deficiency  . Nausea & vomiting . Abdominal pain . Chronic migraine . Fibromyalgia . pain medication seeking behavior  . hypokalemia, hypomagnesemia  . Anxiety state  Consults: Neurology via phone consultation   Recommendations for Outpatient Follow-up:  Patient will need close pain management clinic follow-up, case management currently arranging  PCP appointment has been scheduled for the patient for close follow-up  TESTS THAT NEED FOLLOW-UP CBC, BMET   DIET: Regular diet    Allergies:   Allergies  Allergen Reactions  . Tomato Shortness Of Breath, Swelling and Rash  . Aspirin Hives  . Doxycycline Nausea And Vomiting  . Hydromorphone Hcl Itching    Patient can take but has to have benadryl in addition to  . Ibuprofen Hives  . Metoclopramide Other (See Comments)    "makes me feel jittery"  . Other Hives    Mayonaise  . Shellfish Allergy Hives  . Toradol [Ketorolac Tromethamine] Hives  . Latex Itching and Rash    Vaginal Only     Discharge Medications:   Medication List    STOP taking these medications        diazepam 5 MG tablet  Commonly known as:  VALIUM     methocarbamol 500 MG tablet  Commonly known as:  ROBAXIN      TAKE these medications        acetaminophen 500 MG tablet  Commonly known as:  TYLENOL  Take 500 mg by mouth every 6 (six) hours as needed for pain.     albuterol 108 (90 BASE) MCG/ACT inhaler  Commonly known as:  PROVENTIL HFA;VENTOLIN HFA  Inhale 2 puffs into the lungs every 6 (six) hours as needed for wheezing or shortness of breath.     amitriptyline 25 MG tablet  Commonly known as:  ELAVIL  Take 1 tablet (25 mg  total) by mouth at bedtime.     butalbital-acetaminophen-caffeine 50-325-40 MG tablet  Commonly known as:  FIORICET, ESGIC  Take 2 tablets by mouth every 6 (six) hours as needed for headache.     cyanocobalamin 1000 MCG tablet  Take 1 tablet (1,000 mcg total) by mouth daily.     cyclobenzaprine 10 MG tablet  Commonly known as:  FLEXERIL  Take 1 tablet (10 mg total) by mouth 3 (three) times daily as needed for muscle spasms.     diphenhydrAMINE 25 mg capsule  Commonly known as:  BENADRYL  Take 1 capsule (25 mg total) by mouth every 6 (six) hours as needed for itching. Also available OTC     feeding supplement (ENSURE ENLIVE) Liqd  Take 237 mLs by mouth 3 (three) times daily between meals.     folic acid 1 MG tablet  Commonly known as:  FOLVITE  Take 1 tablet (1 mg total) by mouth daily.     gabapentin 300 MG capsule  Commonly known as:  NEURONTIN  Take 1 capsule (300 mg total) by mouth 3 (three) times daily.     magnesium oxide 400 (241.3 MG) MG tablet  Commonly known as:  MAG-OX  Take 1 tablet (400 mg total) by mouth 2 (two) times daily.     nicotine 21 mg/24hr patch  Commonly known as:  NICODERM CQ - dosed in mg/24 hours  Place 1 patch (21 mg total) onto the skin daily.     oxyCODONE 5 MG immediate release tablet  Commonly known as:  Oxy IR/ROXICODONE  Take 1 tablet (5 mg total) by mouth every 4 (four) hours as needed for severe pain or breakthrough pain.     oxyCODONE-acetaminophen 5-325 MG tablet  Commonly known as:  PERCOCET/ROXICET  Take 1-2 tablets by mouth every 4 (four) hours as needed for moderate pain.     pantoprazole 40 MG tablet  Commonly known as:  PROTONIX  Take 1 tablet (40 mg total) by mouth daily.     polyethylene glycol packet  Commonly known as:  MIRALAX  Take 17 g by mouth daily.     promethazine 25 MG tablet  Commonly known as:  PHENERGAN  Take 1 tablet (25 mg total) by mouth every 6 (six) hours as needed for nausea or vomiting.     UNABLE  TO FIND  Outpatient physical therapy  Diagnosis: lower extremity weakness, B12 deficiency         Brief H and P: For complete details please refer to admission H and P, but in brief [place] HEIRESS WILLIAMSON is a 29 y.o. female with a history of fibromyalgia who presents with a complaint of bilateral lower extremity pain followed by numbness and tingling and subsequently followed by weakness. Symptoms started about 1 week ago. Soon after the symptoms started, the patient became nauseated and started vomiting. She attributes the GI symptoms to the pain in her legs. No complaint of diarrhea. She has diffuse abdominal tenderness. No fever or chills.   Hospital Course:   Bilateral Lower extremity weakness/ Vitamin B12 deficiency: Bilateral lower extremity pain, numbness, tingling likely secondary to severe B-12 deficiency. -B-12 level 91 on admission-has improved with replacement and is now elevated 3394 - MRI of the lumbar and thoracic spine is negative for cord compression  - Intrinsic factor and antiparietal antibodies negative, she is not a vegetarian - Dr. Kerry Hough discussed with neurology, Dr. Thad Ranger who recommended that patient may have idiopathic B-12 deficiency - recommended we start Neurontin instead of narcotics for pain. Patient was started on Neurontin at a dose of 100 mg 3 times a day and subsequently increased to 300 TID.patient has been tolerating Neurontin very well.   - Patient has been requesting for round-the-clock IV Dilaudid and oral narcotics (Which raises the suspicion for pain medication seeking behavior). IV Dilaudid was discontinued. Patient reports that she was on Percocet 10 mg prior to coming to [facility] and she was being established at the pain clinic. For now will continue current pain management, given prescription for Percocet 5/25 mg #30, with no refills, oxycodone 5 mg #30 with no refills for severe or breakthrough pain). I strongly recommended her to follow up with pain  management clinic for further weaning or escalation of pain medications.    Abdominal pain/ Nausea & vomiting -No history of peptic ulcer disease. No hematemesis. No sick contacts and no history of ingesting fast food prior to symptoms-no fever or chills and no diarrhea -CT abdomen showed no acute inflammatory process, normal appendix. No SBO.  -wean down narcotics, continue antiemetics, PPI - Tolerating diet without difficulty.   Hypokalemia, hypomagnesemia -Currently stable, Placed on oral magnesium   Fibromyalgia and chronic pain syndrome -States she is on Robaxin for this (muscle cramping?)-Will continue via IV - Since patient is tolerating diet, will continue oral narcotics, takes Percocet as an outpatient.  She will need to follow up with Pain clinic.Case management working on establishing pain clinic appointment  Cigarette smoker -Have counseled her-continue nicotine patch  Sickle cell anemia? -Does not have anemia, Hemoglobin electrophoresis showed hemoglobin C trait   Day of Discharge BP 122/84 mmHg  Pulse 98  Temp(Src) 98.2 F (36.8 C) (Oral)  Resp 18  Ht 5' (1.524 m)  Wt 65.862 kg (145 lb 3.2 oz)  BMI 28.36 kg/m2  SpO2 100%  LMP 10/02/2014  Physical Exam: General: Alert and awake oriented x3 not in any acute distress. HEENT: anicteric sclera, pupils reactive to light and accommodation CVS: S1-S2 clear no murmur rubs or gallops Chest: clear to auscultation bilaterally, no wheezing rales or rhonchi Abdomen: soft nontender, nondistended, normal bowel sounds Extremities: no cyanosis, clubbing or edema noted bilaterally Neuro: Cranial nerves II-XII intact, no focal neurological deficits   The results of significant diagnostics from this hospitalization (including imaging, microbiology, ancillary and laboratory) are listed below for reference.    LAB RESULTS: Basic Metabolic Panel:  Recent Labs Lab 10/15/14 0540 10/15/14 1500 10/16/14 0526  NA  --  138  137  K  --  4.1 3.8  CL  --  108 103  CO2  --  27 28  GLUCOSE  --  112* 85  BUN  --  <5* <5*  CREATININE 0.74 0.66 0.62  CALCIUM  --  8.6* 8.8*  MG 1.9  --   --    Liver Function Tests: No results for input(s): AST, ALT, ALKPHOS, BILITOT, PROT, ALBUMIN in the last 168 hours. No results for input(s): LIPASE, AMYLASE in the last 168 hours. No results for input(s): AMMONIA in the last 168 hours. CBC:  Recent Labs Lab 10/12/14 1150  WBC 8.4  HGB 12.3  HCT 35.3*  MCV 80.4  PLT 186   Cardiac Enzymes:  Recent Labs Lab 10/09/14 1440  CKTOTAL 90   BNP: Invalid input(s): POCBNP CBG: No results for input(s): GLUCAP in the last 168 hours.  Significant Diagnostic Studies:  No results found.  2D ECHO:   Disposition and Follow-up:     Discharge Instructions    Diet general    Complete by:  As directed      Increase activity slowly    Complete by:  As directed             DISPOSITION:home    DISCHARGE FOLLOW-UP Follow-up Information    Follow up with [facility] 10/31/2014.   Specialty:  Occupational Therapy   Why:  at 1:00PM, for hospital follow-up   Contact information:   [address] [place] [address] [phone]       Follow up with [facility].   Why:  Outpatient Rehabilitation ph [phone] they will call you with the appointment time.   Contact information:   [address] [place] [address]       Please follow up.   Why:  [facility],  Dr Cornell Barman ph [phone]  await appointment time.        Time spent on Discharge: 40 mins   Signed:   RAI,RIPUDEEP M.D. [facility] 10/16/2014, 12:34 PM Pager: [phone]

## 2014-10-16 NOTE — Progress Notes (Signed)
Pt being discharged home via wheelchair with family. Pt alert and oriented x4. VSS. Pt c/o no pain at this time. No signs of respiratory distress. Education complete and care plans resolved. IV removed with catheter intact and pt tolerated well. No further issues at this time. Pt to follow up with PCP. Stone,Heather R, RN 

## 2014-10-16 NOTE — Progress Notes (Signed)
Pt requesting IV pain medication since this am. MD notified and RN spoke with MD on several occasions about this. No orders received; however, pt received nausea, po pain and anxiety medications. All was explained to the patient via the RN. NT also spoke with pt about bathing and working towards discharge but pt refused at this time.  Hourly rounding was done, if not more frequent.  Pt became more aggravated once IV pain med not to be given per MD. CN spoke with pt and one dose of dilaudid was given. Pt to be discharged once PICC removed. Father at bedside and no issues at this time. Jillyn Hidden, RN

## 2014-10-17 MED ORDER — FOLIC ACID 1 MG PO TABS: 1.0000 mg | ORAL_TABLET | Freq: Every day | ORAL | Status: DC

## 2014-10-17 MED ORDER — POLYETHYLENE GLYCOL 3350 17 G PO PACK: 17.0000 g | PACK | Freq: Every day | ORAL | Status: DC

## 2014-10-17 MED ORDER — BUTALBITAL-APAP-CAFFEINE 50-325-40 MG PO TABS: 2.0000 | ORAL_TABLET | Freq: Four times a day (QID) | ORAL | Status: DC | PRN

## 2014-10-17 MED ORDER — NICOTINE 21 MG/24HR TD PT24: 21.0000 mg | MEDICATED_PATCH | Freq: Every day | TRANSDERMAL | Status: DC

## 2014-10-17 MED ORDER — AMITRIPTYLINE HCL 25 MG PO TABS: 25.0000 mg | ORAL_TABLET | Freq: Every day | ORAL | Status: DC

## 2014-10-17 MED ORDER — DIPHENHYDRAMINE HCL 25 MG PO CAPS: 25.0000 mg | ORAL_CAPSULE | Freq: Four times a day (QID) | ORAL | Status: DC | PRN

## 2014-10-17 MED ORDER — GABAPENTIN 300 MG PO CAPS: 300.0000 mg | ORAL_CAPSULE | Freq: Three times a day (TID) | ORAL | Status: DC

## 2014-10-17 MED ORDER — OXYCODONE-ACETAMINOPHEN 5-325 MG PO TABS: 1.0000 | ORAL_TABLET | ORAL | Status: DC | PRN

## 2014-10-17 MED ORDER — OXYCODONE HCL 5 MG PO TABS: 5.0000 mg | ORAL_TABLET | ORAL | Status: DC | PRN

## 2014-10-17 MED ORDER — MAGNESIUM OXIDE 400 (241.3 MG) MG PO TABS: 400.0000 mg | ORAL_TABLET | Freq: Two times a day (BID) | ORAL | Status: DC

## 2014-10-17 MED ORDER — CYCLOBENZAPRINE HCL 10 MG PO TABS: 10.0000 mg | ORAL_TABLET | Freq: Three times a day (TID) | ORAL | Status: DC | PRN

## 2014-10-17 MED ORDER — CYANOCOBALAMIN 1000 MCG PO TABS: 1000.0000 ug | ORAL_TABLET | Freq: Every day | ORAL | Status: DC

## 2014-10-17 MED ORDER — PANTOPRAZOLE SODIUM 40 MG PO TBEC: 40.0000 mg | DELAYED_RELEASE_TABLET | Freq: Every day | ORAL | Status: DC

## 2014-10-17 MED ORDER — PROMETHAZINE HCL 25 MG PO TABS: 25.0000 mg | ORAL_TABLET | Freq: Four times a day (QID) | ORAL | Status: DC | PRN

## 2014-10-17 MED ORDER — ENSURE ENLIVE PO LIQD: 237.0000 mL | Freq: Three times a day (TID) | ORAL | Status: DC

## 2014-10-20 NOTE — Progress Notes (Signed)
10/20/2014 1:07 PM  This is a late entry.  Received phone call from patient mid-morning regarding difficulties obtaining prescription medications.  Ms. Sapien stated that the [facility] [place] would not fill her medications as Dr. Isidoro Donning was not a Medicaid approved provider.  I informed Ms. Hapke that I would call the pharmacy and try to figure out what the issue was.  Indeed, the pharmacist stated that Dr. Isidoro Donning for whatever reason was not a Medicaid approved provider and that another physician would have to write the scripts.  Dr. Isidoro Donning also called the pharmacy and received the same information.  I told Ms. Tregre that we would arrange for another MD to write her scripts and that she could pick them up shortly.  She verbalized understanding.  Dr. Rickey Barbara wrote the scripts for the patient.  Patient arrived mid to late afternoon to pick them up accompanied by a child and her father.  Patient took all of the prescriptions and stated that she was going to get them filled at a pharmacy  In [place].  Pt then left with her family, scripts in hand.  Theadora Rama

## 2014-10-23 ENCOUNTER — Ambulatory Visit (HOSPITAL_COMMUNITY): Payer: Medicaid - Out of State | Attending: Internal Medicine | Admitting: Physical Therapy

## 2014-10-23 DIAGNOSIS — R29898 Other symptoms and signs involving the musculoskeletal system: Secondary | ICD-10-CM | POA: Diagnosis present

## 2014-10-23 DIAGNOSIS — R6889 Other general symptoms and signs: Secondary | ICD-10-CM | POA: Insufficient documentation

## 2014-10-23 DIAGNOSIS — R262 Difficulty in walking, not elsewhere classified: Secondary | ICD-10-CM

## 2014-10-23 NOTE — Progress Notes (Signed)
10/23/2014 4:18 PM  This is a late entry.  Received phone call around 1430 today from Ms. Navedo with questions regarding insurance coverage of outpatient therapy and home health therapy, as well as questions regarding physician follow up with a primary care doctor and the outpatient pain clinic.  I referred Ms. Ochs to her discharge instructions, in which an appointment has been made with [facility] on 10/14.  I also referred her to the same discharge instructions stating that she would receive outpatient follow up with [facility].  As far as her therapy needs, she had an appointment with outpatient PT today.  Patient asked how she should go about getting a home health aid to help her around the house, to which I informed her that she would likely have to privately pay for those services since she is under [employer].  I encouraged her to research home health aides on her own and contact them accordingly.  Pt then stated that she had been trying to get in touch with the outpatient pain clinic but had been unable to reach someone.  Elnita Maxwell, Case Manager, contacted the clinic and requested for them to contact her right away.  About an hour and a half later, I received a call back from Ms. Holten stating that [facility] pain clinic had denied her and that she didn't know what she would do.  I informed her that if she needed to seek medical treatment, to either followup immediately with the PCP she had been provided, or seek help in the emergency room.  I also gave her phone numbers to the pain management clinics in [address] so that she could contact them on her own to see if she could be accepted.  Patient stated that she would began calling these centers, however, she might end up in the ED sometime in the next 24-28 hours for pain control issues/weakness if she couldn't get the help that she needed.  She had no further questions at this time and stated she would be in touch if  she needed [place].  Theadora Rama

## 2014-10-23 NOTE — Patient Instructions (Signed)
Quad Set  Sit or lie down with small towel roll up under knee. Tighten up quadriceps muscle and you will feel the knee start to straighten. This may not be a great contraction to start with, but should improve with more repetitions.   LONG ARC QUAD - LAQ - HIGH SEAT  While seated with your knee in a bent position, slowly straighten your knee as you raise your foot upwards as shown.  STANDING HEEL RAISES  While standing, raise up on your toes as you lift your heels off the ground.   STANDING HAMSTRING CURLS  While standing, bend your knee so that your heel moves towards your buttock.  WALKER HIP ABDUCTION - FWW ABDUCTIONS  While standing up using a walker, raise your leg out to the side. Keep your knee straight and maintain your toes pointed forward the entire time.   Use your arms for support and balance and have a chair behind you for safety. SIT TO STAND - NO HANDS  Start by sitting in a chair. Next, raise up to standing without using your hands for support.

## 2014-10-24 NOTE — Therapy (Signed)
[facility] [address] [place], [place], [address] Phone: [phone]   Fax:  [phone]  Physical Therapy Evaluation  Patient Details  Name: Tanya Krause MRN: [id number] Date of Birth: 1985/11/22 Referring Provider:  Cathren Harsh, MD  Encounter Date: 10/23/2014      PT End of Session - 10/23/14 0807    Visit Number 1   Number of Visits 1   Authorization Type Medicaid   PT Start Time 1315   PT Stop Time 1349   PT Time Calculation (min) 34 min   Activity Tolerance Patient limited by fatigue;Patient limited by pain   Behavior During Therapy [facility] for tasks assessed/performed      Past Medical History  Diagnosis Date  . Hypertension   . Migraine   . Asthma   . Pelvic inflammatory disease (PID)   . Sickle cell disease   . Fibromyalgia   . Chronic abdominal pain     Past Surgical History  Procedure Laterality Date  . Tubal ligation    . Kidney infections    . Wisdom tooth extraction      There were no vitals filed for this visit.  Visit Diagnosis:  Weakness of both legs  Difficulty walking  Decreased functional activity tolerance      Subjective Assessment - 10/23/14 1320    Subjective Pt reports that she has vitamin B deficiency, sickle cell anemia, and fibromyalgia. She states that she was hospitalized about 2 weeks ago for pain in both legs, and since she has gotten out of the hospital, she has not been able to walk as well as she could before. She reports that there are times that she can walk well, and times that she can't because of weakness and shaking in her legs. Pt uses rollator for ambulating at all times. Pt reports that she still has excruciating pain in her back and both of her legs. Pt states that she doesn't have much feeling in her feet at this time.     Pertinent History Pt presented to the hospital on 9/21 with c/o BLE pain followed by weakness, numbness, and tingling. Pt was hospitalized until  9/29. In hospital, it was found that pt had severe B-12 deficiency that  normalized prior to discharge. Pt has PMH of sickle cell disease, HTN, PID, fibromyalgia, and chronic abdominal pain.    How long can you sit comfortably? 10-15 minutes   How long can you stand comfortably? <5 minutes   How long can you walk comfortably? <5 minutes   Patient Stated Goals Improve balance and strength, improve walking   Currently in Pain? Yes   Pain Score 8    Pain Location Back  back and into legs   Pain Orientation Right;Left            [facility] PT Assessment - 10/23/14 0001    Assessment   Medical Diagnosis BLE weakness   Next MD Visit 10/31/14   Prior Therapy no   Balance Screen   Has the patient fallen in the past 6 months No   Has the patient had a decrease in activity level because of a fear of falling?  No   Is the patient reluctant to leave their home because of a fear of falling?  No   Strength   Right Hip Flexion 2+/5   Left Hip Flexion 2+/5   Right Knee Flexion 2+/5   Right Knee Extension 2+/5   Left Knee Flexion 3/5  Left Knee Extension 3/5                      PT Education - 10/23/14 1320    Education provided Yes   Education Details Prognosis, HEP, educated about payment options due to Medicaid coverage   Person(s) Educated Patient   Methods Explanation;Handout   Comprehension Verbalized understanding;Returned demonstration             PT Long Term Goals - 10/23/14 1320   PT LONG TERM GOAL #1   Title Pt will be independent with HEP.    Time 1   Period Days   Status Achieved               Plan - 10/076/16 1320   Clinical Impression Statement Pt presents to PT with c/o recent onset of BLE pain, numbness, and weakness, which she was hospitalized for. Pt reported severe pain in today's evaluation, making it difficult to fully assess strength or ROM. Pt demonstrates weakness of bilateral LE, difficulty walking, impaired functional mobility,and  decreased functional activity tolerance. Due to limited insurance coverage, pt was given HEP for LE strengthening to continue with independently. Due to pt's severely impaired functional activity tolerance and inability to tolerate many positions due to pain, pt is a more appropriate candidate for HHPT at this time. It was explained to pt that [facility] is not covered by insurance, and continuing with OPPT would also not be covered by Medicaid. Pt was presented with option to continue with self pay, however, she reported that she would try to find other options for insurance in order to continue with PT treatments. At this time, pt has chosen to continue with HEP.    Pt will benefit from skilled therapeutic intervention in order to improve on the following deficits Abnormal gait;Decreased activity tolerance;Decreased balance;Decreased strength;Difficulty walking;Pain   Rehab Potential Fair   PT Frequency 1x / week   PT Duration --  1 day   PT Treatment/Interventions ADLs/Self Care Home Management   PT Home Exercise Plan given for LE strengthening         Problem List Patient Active Problem List   Diagnosis Date Noted  . Chronic migraine 10/11/2014  . Fibromyalgia 10/11/2014  . Lower extremity weakness 10/11/2014  . Vitamin B12 deficiency 10/11/2014  . Anxiety state 10/11/2014  . Nausea & vomiting 10/08/2014  . Headache, tension type, chronic 09/07/2012  . Abdominal pain 06/15/2012    Leona Singleton, PT, DPT [phone]   [facility] [address] [place], [place], [address] Phone: [phone]   Fax:  [phone]

## 2014-10-24 NOTE — Care Management (Addendum)
Late Entry:  10/24/2014   10/23/2014 Notified by charge RN, Carita Pian that this pt had called the department stating that she needed [facility] services and that Outpatient PT was not able to help her. The pt was also concerned that she had not been able to reach the [facility] which was setup at the time of her d/c from the hospital.  This CM reviewed the notes and per EPIC the pt was seen at [facility] on 10/23/2014. I then call [facility] and was informed that the pt was seen and evaluated, however because Medicaid is her payor they are unable to provide ongoing treatment. They had recommended that she contact her PCP for an order for HHPT. Again this pt does not have coverage for HHPT as her [place] Medicaid does not qualify her for this services. Additionally this pt does not have a PCP, however an appointment has been arranged for her at [facility] for Oct 14,2016 .  This CM also contacted the office of Dr Cornell Barman at the [facility] and was told that the pt record had been reviewed and that the doctor would be unable to accept her as he had reviewed her case and was unable to manage her pain as all test and radiology studies were neg. This CM requested that that office contact the pt and inform her of that decision.  The pt was reminded of her appointment at the Health Dept. Johny Shock RN MPH, case manager, [phone]

## 2014-11-11 NOTE — Progress Notes (Signed)
Late Entry 11/10/14  Received call from Ms. Jan Fireman on 10/24. During the phone call Ms. Mccolm stated that she was needing assistance because she was almost out of her medications and needed new prescriptions. Discussed with Ms. Locey that routinely the hospitalitis do not provide additional refills on prescriptions once patients have been discharged, and encouraged her to seek out her PCP for additional medications if needed. Ms. Rodriges stated that she was being seen at the health department and that they could not write her prescriptions for her medications and that she  did not have a PCP because no MDs or PAs were currently accepting Medicaid patients. Ms. Spagnolo stated that she was almost out of her pain and nausea medications and didn't want to go without them. She stated that she had an appointment scheduled with the pain clinic on 12/24/14 but didn't have enough medications to last until then.  Informed Ms. Luevano that I would speak with the care manager and return her call.   11/11/14. Spoke with [employer], CM regarding patietns requests and situation discussed above. Elnita Maxwell made patient appointment at the [facility] for Nov 1 at 12 noon.   Called and spoke with patient to make aware of the above appointment details. Patient requested the information be emailed to her personal email at [email or web address]. The email copied below was emailed to patient.  Patient stated that she was almost out of her medications. Informed patient to contact her PCP or to seek medical treatment if she felt it necessary. Pt was thankful for the return call and assistance provided with this matter.   Pasted from email sent to [email or web address] Hello Ms. Granzow,  As requested when we spoke via phone today,  here is the information about your appointment that was made for you.  Nov 18, 2014 at 12 noon please arrive 15 min early [address] [place] [phone]  (Remember to call and cancel or reschedule if needed)  Feel free to call me back with questions.  Adaline Sill [phone] Director [address] [facility]

## 2014-11-17 ENCOUNTER — Ambulatory Visit (INDEPENDENT_AMBULATORY_CARE_PROVIDER_SITE_OTHER): Payer: Medicaid Other | Admitting: Diagnostic Neuroimaging

## 2014-11-17 ENCOUNTER — Encounter: Payer: Self-pay | Admitting: Diagnostic Neuroimaging

## 2014-11-17 ENCOUNTER — Telehealth: Payer: Self-pay | Admitting: Diagnostic Neuroimaging

## 2014-11-17 VITALS — BP 132/93 | HR 96 | Ht 60.0 in | Wt 156.8 lb

## 2014-11-17 DIAGNOSIS — E538 Deficiency of other specified B group vitamins: Secondary | ICD-10-CM

## 2014-11-17 DIAGNOSIS — R200 Anesthesia of skin: Secondary | ICD-10-CM | POA: Diagnosis not present

## 2014-11-17 DIAGNOSIS — G894 Chronic pain syndrome: Secondary | ICD-10-CM | POA: Diagnosis not present

## 2014-11-17 DIAGNOSIS — G43719 Chronic migraine without aura, intractable, without status migrainosus: Secondary | ICD-10-CM

## 2014-11-17 DIAGNOSIS — R51 Headache: Secondary | ICD-10-CM | POA: Diagnosis not present

## 2014-11-17 DIAGNOSIS — R519 Headache, unspecified: Secondary | ICD-10-CM | POA: Insufficient documentation

## 2014-11-17 MED ORDER — AMITRIPTYLINE HCL 50 MG PO TABS: 50.0000 mg | ORAL_TABLET | Freq: Every day | ORAL | Status: DC

## 2014-11-17 NOTE — Progress Notes (Signed)
[facility]  PATIENT: Tanya Krause DOB: Oct 24, 1985  REFERRING CLINICIAN: Muse  HISTORY FROM: patient  REASON FOR VISIT: new consult (existing patient)   HISTORICAL  CHIEF COMPLAINT:  Chief Complaint  Patient presents with  . Migraine    rm 7, New Patient, Transfer of Care    HISTORY OF PRESENT ILLNESS:   UPDATE 11/17/14: Since last visit had moved to [facility] for a job. Over summer 2016, was having more headache, fibromyalgia, numbness, weakness, and multiple ER visits in [place]. Then cam back to [place] in August 2016. Sept 2016 went to ER, then admitted. Found to have low B12 level (91). Had poor PO intake due to nausea and vomiting. Now using a walker. Now living with her parents.   Migraine headaches --> global headache, throbbing and beating pain, neck pain, shoulder, nausea/vomiting, photophobia/phonophobia. 15-20 days per month. On other days, milder dull headaches.  UPDATE 03/08/12: Since last visit, was doing well until 3 weeks ago, then developed increasing HA (similar type, but more severe intensity) with left neck pain. Zomig not helping. Went to ER as well.  UPDATE 12/06/11: She continues to have headaches daily, more on left side of head, radiating to neck and shoulders.  She was taking Atenolol for blood pressure during pregnancy but has not taken x 1 year.  She is tolerating Amitriptylline 50mg  at hs without relief.  Also taking phenergan 25mg  without relief.  She is also taking Advil PM 1 1/2 tablet daily.  Not sleeping averaging 2-3 hours per night.  Denies increase stress.  She did have relief after last visit and depacon infusion.  Now has a headache 10/10.  She has not been seen by opthamololgy.  She reports she has also lost a significant amount of weight in the last month.  Since her last visit she has lost 19 pounds. She has had a tubal ligation.    UPDATE 07/04/11:  Continues to have persistent headaches 4-5 times per day, 3-4 days per week.   Amitriptylline 25mg  has helped but she is currently out of her prescription.  Headache is frontal, throbbing with nausea, photophobia and phonophobia.  She has had multiple ER visits.  She has tried sumitriptan without relief, Fiorcet helped minimally.     PRIOR HPI (11/23/10):  29 year old right-handed female with history of hypertension, here for evaluation of intractable migraine headaches. Patient reports history of headaches since age 29 years old. She describes left-sided, throbbing, severe headaches associated with nausea, vomiting, photophobia and phonophobia. Now she is having 3-5 days of headache per week. She's had multiple visits to the emergency room for headache relief. Now she also feels left-hand weakness with some of her headaches. Strong smells, light and loud noises tend to trigger her headaches. She has tried Topamax, hydrocodone, Fioricet, Maxalt, sumatriptan, Tylenol without relief. Toradol injections provide some relief.    REVIEW OF SYSTEMS: Full 14 system review of systems performed and notable only for weight loss swelling in legs shortness of breath snoring joint pain cramps constipation headache numbness weakness depression not enough sleep decr energy change in appetite.    ALLERGIES: Allergies  Allergen Reactions  . Tomato Shortness Of Breath, Swelling and Rash  . Aspirin Hives  . Doxycycline Nausea And Vomiting  . Hydromorphone Hcl Itching    Patient can take but has to have benadryl in addition to  . Ibuprofen Hives  . Metoclopramide Other (See Comments)    "makes me feel jittery"  . Other Hives  Mayonaise  . Shellfish Allergy Hives  . Toradol [Ketorolac Tromethamine] Hives  . Latex Itching and Rash    Vaginal Only    HOME MEDICATIONS: Outpatient Prescriptions Prior to Visit  Medication Sig Dispense Refill  . acetaminophen (TYLENOL) 500 MG tablet Take 500 mg by mouth every 6 (six) hours as needed for pain.     Marland Kitchen albuterol (PROVENTIL HFA;VENTOLIN HFA)  108 (90 BASE) MCG/ACT inhaler Inhale 2 puffs into the lungs every 6 (six) hours as needed for wheezing or shortness of breath.    Marland Kitchen amitriptyline (ELAVIL) 25 MG tablet Take 1 tablet (25 mg total) by mouth at bedtime. 30 tablet 1  . butalbital-acetaminophen-caffeine (FIORICET, ESGIC) 50-325-40 MG tablet Take 2 tablets by mouth every 6 (six) hours as needed for headache. 30 tablet 0  . cyanocobalamin 1000 MCG tablet Take 1 tablet (1,000 mcg total) by mouth daily. 30 tablet 3  . cyclobenzaprine (FLEXERIL) 10 MG tablet Take 1 tablet (10 mg total) by mouth 3 (three) times daily as needed for muscle spasms. 90 tablet 1  . diphenhydrAMINE (BENADRYL) 25 mg capsule Take 1 capsule (25 mg total) by mouth every 6 (six) hours as needed for itching. Also available OTC 60 capsule 0  . feeding supplement, ENSURE ENLIVE, (ENSURE ENLIVE) LIQD Take 237 mLs by mouth 3 (three) times daily between meals. 30 Bottle 1  . folic acid (FOLVITE) 1 MG tablet Take 1 tablet (1 mg total) by mouth daily. 30 tablet 2  . gabapentin (NEURONTIN) 300 MG capsule Take 1 capsule (300 mg total) by mouth 3 (three) times daily. 90 capsule 1  . magnesium oxide (MAG-OX) 400 (241.3 MG) MG tablet Take 1 tablet (400 mg total) by mouth 2 (two) times daily. 60 tablet 0  . nicotine (NICODERM CQ - DOSED IN MG/24 HOURS) 21 mg/24hr patch Place 1 patch (21 mg total) onto the skin daily. 28 patch 1  . oxyCODONE (OXY IR/ROXICODONE) 5 MG immediate release tablet Take 1 tablet (5 mg total) by mouth every 4 (four) hours as needed for severe pain or breakthrough pain. 30 tablet 0  . oxyCODONE-acetaminophen (PERCOCET/ROXICET) 5-325 MG tablet Take 1-2 tablets by mouth every 4 (four) hours as needed for moderate pain. 30 tablet 0  . pantoprazole (PROTONIX) 40 MG tablet Take 1 tablet (40 mg total) by mouth daily. 30 tablet 1  . polyethylene glycol (MIRALAX) packet Take 17 g by mouth daily. 30 each 3  . promethazine (PHENERGAN) 25 MG tablet Take 1 tablet (25 mg  total) by mouth every 6 (six) hours as needed for nausea or vomiting. 30 tablet 0  . UNABLE TO FIND Outpatient physical therapy  Diagnosis: lower extremity weakness, B12 deficiency 1 Mutually Defined 0   No facility-administered medications prior to visit.    PAST MEDICAL HISTORY: Past Medical History  Diagnosis Date  . Hypertension   . Asthma   . Pelvic inflammatory disease (PID)   . Sickle cell disease (HCC)   . Fibromyalgia   . Chronic abdominal pain   . Migraine     PAST SURGICAL HISTORY: Past Surgical History  Procedure Laterality Date  . Tubal ligation    . Kidney infections    . Wisdom tooth extraction      FAMILY HISTORY: Family History  Problem Relation Age of Onset  . Hypertension Mother   . Diabetes Mother   . Heart failure Father     SOCIAL HISTORY:  Social History   Social History  . Marital Status: Divorced  Spouse Name: N/A  . Number of Children: 4  . Years of Education: 12   Occupational History  .      unemployed   Social History Main Topics  . Smoking status: Light Tobacco Smoker    Types: Cigarettes  . Smokeless tobacco: Never Used     Comment: Occasional smoker  . Alcohol Use: Yes     Comment: occasionally-once a month  . Drug Use: No  . Sexual Activity: Yes    Birth Control/ Protection: Surgical   Other Topics Concern  . Not on file   Social History Narrative   Lives with parents, children   Caffeine use- coffee once a day, sodas x 2 a day     PHYSICAL EXAM  GENERAL EXAM/CONSTITUTIONAL: Vitals:  Filed Vitals:   11/17/14 0936  BP: 132/93  Pulse: 96  Height: 5' (1.524 m)  Weight: 156 lb 12.8 oz (71.124 kg)     Body mass index is 30.62 kg/(m^2).  Visual Acuity Screening   Right eye Left eye Both eyes  Without correction:     With correction: 20/50 20/70   Comments: 11/17/14 Wears contacts    Patient is in no distress; well developed, nourished and groomed; neck is supple  WEAK, MALAISE  APPEARANCE  INTERMITTENT, WAXING AND WANING, HEAD, NECK, SHOULDER TREMORS  CARDIOVASCULAR:  Examination of carotid arteries is normal; no carotid bruits  Regular rate and rhythm, no murmurs  Examination of peripheral vascular system by observation and palpation is normal  EYES:  Ophthalmoscopic exam of optic discs and posterior segments is normal; no papilledema or hemorrhages  MUSCULOSKELETAL:  Gait, strength, tone, movements noted in Neurologic exam below  NEUROLOGIC: MENTAL STATUS:  No flowsheet data found.  awake, alert, oriented to person, place and time  recent and remote memory intact  normal attention and concentration  language fluent, comprehension intact, naming intact,   fund of knowledge appropriate  CRANIAL NERVE:   2nd - no papilledema on fundoscopic exam  2nd, 3rd, 4th, 6th - pupils equal and reactive to light, visual fields full to confrontation, extraocular muscles intact, no nystagmus; SUBJECTIVE BLURRED VISION IN ALL DIRECTIONS OF EYE MOVEMENTS  5th - facial sensation symmetric  7th - facial strength symmetric  8th - hearing intact  9th - palate elevates symmetrically, uvula midline  11th - shoulder shrug symmetric  12th - tongue protrusion midline  MOTOR:   normal bulk and tone; BUE 3; BLE (RIGHT HF 2-3; LEFT HF 3; KE/KF 3, DF 2-3)  SENSORY:   normal and symmetric to light touch, pinprick, temperature, vibration and proprioception  COORDINATION:   finger-nose-finger, fine finger movements --> SLOW  REFLEXES:   deep tendon reflexes; RUE 3, LUE 2; RLE 2; LLE 1  GAIT/STATION:   narrow based gait; able to walk on toes, heels and tandem; romberg is negative    DIAGNOSTIC DATA (LABS, IMAGING, TESTING) - I reviewed patient records, labs, notes, testing and imaging myself where available.  Lab Results  Component Value Date   WBC 8.4 10/12/2014   HGB 12.3 10/12/2014   HCT 35.3* 10/12/2014   MCV 80.4 10/12/2014   PLT 186  10/12/2014      Component Value Date/Time   NA 137 10/16/2014 0526   K 3.8 10/16/2014 0526   CL 103 10/16/2014 0526   CO2 28 10/16/2014 0526   GLUCOSE 85 10/16/2014 0526   BUN <5* 10/16/2014 0526   CREATININE 0.62 10/16/2014 0526   CALCIUM 8.8* 10/16/2014 0526  PROT 7.9 10/09/2014 0703   ALBUMIN 4.4 10/09/2014 0703   AST 20 10/09/2014 0703   ALT 10* 10/09/2014 0703   ALKPHOS 42 10/09/2014 0703   BILITOT 0.6 10/09/2014 0703   GFRNONAA >60 10/16/2014 0526   GFRAA >60 10/16/2014 0526   No results found for: CHOL, HDL, LDLCALC, LDLDIRECT, TRIG, CHOLHDL No results found for: ZHYQ6V Lab Results  Component Value Date   VITAMINB12 3394* 10/14/2014   Lab Results  Component Value Date   TSH 0.718 10/09/2014    10/12/14 MRI thoracic / lumbar  - Normal noncontrast MRI appearance of the thoracic and lumbar spine.    ASSESSMENT AND PLAN  29 y.o. year old female here with intractable headaches, migraine, pain, insomnia. Now with B12 deficiency, weakness, asymmetric weakness and hyperreflexia. Needs further workup.  Dx:  Intractable chronic migraine without aura and without status migrainosus  Intractable headache, unspecified chronicity pattern, unspecified headache type - Plan: MR Cervical Spine Wo Contrast, MR Brain Wo Contrast  Numbness - Plan: MR Cervical Spine Wo Contrast, MR Brain Wo Contrast  Chronic pain syndrome - Plan: MR Cervical Spine Wo Contrast, MR Brain Wo Contrast  B12 deficiency - Plan: MR Cervical Spine Wo Contrast, MR Brain Wo Contrast     PLAN: - check MRI brain and cervical spine - increase amitriptyline to  at bedtime for migraine prevention - OTC tylenol for breakthrough headaches - pain mgmt clinic referral is pending  Orders Placed This Encounter  Procedures  . MR Cervical Spine Wo Contrast  . MR Brain Wo Contrast   Meds ordered this encounter  Medications  . amitriptyline (ELAVIL) 50 MG tablet    Sig: Take 1 tablet (50 mg total) by  mouth at bedtime.    Dispense:  30 tablet    Refill:  6   Return in about 6 weeks (around 12/29/2014).    Suanne Marker, MD 11/17/2014, 9:50 AM Certified in Neurology, Neurophysiology and Neuroimaging  [facility] [address], [address] [place], [place] [address] [phone]

## 2014-11-17 NOTE — Telephone Encounter (Signed)
Pt stated that should would like to see if there can be a pain med like an extra strength tylenol sent as an Rx.

## 2014-11-17 NOTE — Telephone Encounter (Signed)
OTC tylenol only.

## 2014-11-17 NOTE — Patient Instructions (Signed)
Thank you for coming to see [place] at [facility]. I hope we have been able to provide you high quality care today.  You may receive a patient satisfaction survey over the next few weeks. We would appreciate your feedback and comments so that we may continue to improve ourselves and the health of our patients.  - I will check MRI brain and cervical spine - increase amitriptyline to 76m at bedtime   ~~~~~~~~~~~~~~~~~~~~~~~~~~~~~~~~~~~~~~~~~~~~~~~~~~~~~~~~~~~~~~~~~  DR. PENUMALLI'S GUIDE TO HAPPY AND HEALTHY LIVING These are some of my general health and wellness recommendations. Some of them may apply to you better than others. Please use common sense as you try these suggestions and feel free to ask me any questions.   ACTIVITY/FITNESS Mental, social, emotional and physical stimulation are very important for brain and body health. Try learning a new activity (arts, music, language, sports, games).  Keep moving your body to the best of your abilities. You can do this at home, inside or outside, the park, community center, gym or anywhere you like. Consider a physical therapist or personal trainer to get started. Consider the app Sworkit. Fitness trackers such as smart-watches, smart-phones or Fitbits can help as well.   NUTRITION Eat more plants: colorful vegetables, nuts, seeds and berries.  Eat less sugar, salt, preservatives and processed foods.  Avoid toxins such as cigarettes and alcohol.  Drink water when you are thirsty. Warm water with a slice of lemon is an excellent morning drink to start the day.  Consider these websites for more information The Nutrition Source (hhttps://www.henry-hernandez.biz/ [employer] Nutrition (wWindowBlog.ch   RELAXATION Consider practicing mindfulness meditation or other relaxation techniques such as deep breathing, prayer, yoga, tai chi, massage. See website mindful.org or the apps  Headspace or Calm to help get started.   SLEEP Try to get at least 7-8+ hours sleep per day. Regular exercise and reduced caffeine will help you sleep better. Practice good sleep hygeine techniques. See website sleep.org for more information.   PLANNING Prepare estate planning, living will, healthcare POA documents. Sometimes this is best planned with the help of an attorney. Theconversationproject.org and agingwithdignity.org are excellent resources.

## 2014-11-17 NOTE — Telephone Encounter (Addendum)
Called number listed as home , no answer, unable to leave VM.  Called number listed as home phone, [phone] and was told wrong number. Called mobile number, left vm requesting call back ; left this caller's name, number. Patient called back, gave current phone numbers. Per Dr Marjory Lies, informed her that he will not prescribe any medications at this time. He recommends she increase Amitriptyline at night, give the increased dose time to work and buy OTC Tylenol but not take more than 5-10 a month. She stated she is seeing another dr tomorrow, verbalized understanding and hung up phone.Marland Kitchen

## 2014-11-18 ENCOUNTER — Telehealth: Payer: Self-pay | Admitting: Family Medicine

## 2014-11-18 ENCOUNTER — Encounter: Payer: Self-pay | Admitting: Family Medicine

## 2014-11-18 ENCOUNTER — Ambulatory Visit: Payer: Medicaid Other | Attending: Family Medicine | Admitting: Family Medicine

## 2014-11-18 VITALS — BP 134/88 | HR 92 | Temp 98.5°F | Resp 18 | Ht 60.0 in | Wt 146.0 lb

## 2014-11-18 DIAGNOSIS — Z833 Family history of diabetes mellitus: Secondary | ICD-10-CM | POA: Diagnosis not present

## 2014-11-18 DIAGNOSIS — Z885 Allergy status to narcotic agent status: Secondary | ICD-10-CM | POA: Diagnosis not present

## 2014-11-18 DIAGNOSIS — D571 Sickle-cell disease without crisis: Secondary | ICD-10-CM | POA: Insufficient documentation

## 2014-11-18 DIAGNOSIS — Z79899 Other long term (current) drug therapy: Secondary | ICD-10-CM | POA: Insufficient documentation

## 2014-11-18 DIAGNOSIS — Z5189 Encounter for other specified aftercare: Secondary | ICD-10-CM | POA: Diagnosis present

## 2014-11-18 DIAGNOSIS — F329 Major depressive disorder, single episode, unspecified: Secondary | ICD-10-CM | POA: Insufficient documentation

## 2014-11-18 DIAGNOSIS — G894 Chronic pain syndrome: Secondary | ICD-10-CM | POA: Diagnosis not present

## 2014-11-18 DIAGNOSIS — I10 Essential (primary) hypertension: Secondary | ICD-10-CM | POA: Diagnosis not present

## 2014-11-18 DIAGNOSIS — E538 Deficiency of other specified B group vitamins: Secondary | ICD-10-CM | POA: Insufficient documentation

## 2014-11-18 DIAGNOSIS — G43709 Chronic migraine without aura, not intractable, without status migrainosus: Secondary | ICD-10-CM | POA: Diagnosis not present

## 2014-11-18 DIAGNOSIS — M797 Fibromyalgia: Secondary | ICD-10-CM | POA: Insufficient documentation

## 2014-11-18 DIAGNOSIS — K219 Gastro-esophageal reflux disease without esophagitis: Secondary | ICD-10-CM | POA: Insufficient documentation

## 2014-11-18 DIAGNOSIS — J45909 Unspecified asthma, uncomplicated: Secondary | ICD-10-CM | POA: Insufficient documentation

## 2014-11-18 DIAGNOSIS — B370 Candidal stomatitis: Secondary | ICD-10-CM | POA: Insufficient documentation

## 2014-11-18 DIAGNOSIS — F17210 Nicotine dependence, cigarettes, uncomplicated: Secondary | ICD-10-CM | POA: Diagnosis not present

## 2014-11-18 DIAGNOSIS — Z8249 Family history of ischemic heart disease and other diseases of the circulatory system: Secondary | ICD-10-CM | POA: Diagnosis not present

## 2014-11-18 DIAGNOSIS — R29898 Other symptoms and signs involving the musculoskeletal system: Secondary | ICD-10-CM | POA: Insufficient documentation

## 2014-11-18 DIAGNOSIS — IMO0002 Reserved for concepts with insufficient information to code with codable children: Secondary | ICD-10-CM

## 2014-11-18 DIAGNOSIS — F419 Anxiety disorder, unspecified: Secondary | ICD-10-CM | POA: Insufficient documentation

## 2014-11-18 DIAGNOSIS — G43909 Migraine, unspecified, not intractable, without status migrainosus: Secondary | ICD-10-CM | POA: Insufficient documentation

## 2014-11-18 MED ORDER — PANTOPRAZOLE SODIUM 40 MG PO TBEC: 40.0000 mg | DELAYED_RELEASE_TABLET | Freq: Every day | ORAL | Status: DC

## 2014-11-18 MED ORDER — MAGNESIUM OXIDE 400 (241.3 MG) MG PO TABS: 400.0000 mg | ORAL_TABLET | Freq: Two times a day (BID) | ORAL | Status: DC

## 2014-11-18 MED ORDER — CYCLOBENZAPRINE HCL 10 MG PO TABS: 10.0000 mg | ORAL_TABLET | Freq: Three times a day (TID) | ORAL | Status: DC | PRN

## 2014-11-18 MED ORDER — ACETAMINOPHEN-CODEINE #3 300-30 MG PO TABS: 1.0000 | ORAL_TABLET | Freq: Two times a day (BID) | ORAL | Status: DC | PRN

## 2014-11-18 MED ORDER — NYSTATIN 100000 UNIT/ML MT SUSP
5.0000 mL | Freq: Four times a day (QID) | OROMUCOSAL | Status: DC
Start: 2014-11-18 — End: 2015-01-13

## 2014-11-18 MED ORDER — CYANOCOBALAMIN 1000 MCG PO TABS: 1000.0000 ug | ORAL_TABLET | Freq: Every day | ORAL | Status: DC

## 2014-11-18 MED ORDER — FLUOXETINE HCL 20 MG PO TABS: 20.0000 mg | ORAL_TABLET | Freq: Every day | ORAL | Status: DC

## 2014-11-18 MED ORDER — GABAPENTIN 300 MG PO CAPS: 300.0000 mg | ORAL_CAPSULE | Freq: Three times a day (TID) | ORAL | Status: DC

## 2014-11-18 MED ORDER — PROMETHAZINE HCL 6.25 MG/5ML PO SYRP: 12.5000 mg | ORAL_SOLUTION | Freq: Four times a day (QID) | ORAL | Status: DC | PRN

## 2014-11-18 NOTE — Progress Notes (Signed)
11/18/14 1504  Received voice mail from Mrs. Stemmler. Prior to calling patient back, received phone call from Mrs Mazzaferro who indicated that she attended her appointment this morning at [facility], and that the " MD there would not give her any pain medication" also stated "why did yall send me there anyway? Was it because yall didn't know what you were talking about?" Attempted to expalin that the appointment was made d/t patients last call indicating that she had not been able to find a PCP and get her pain medications. Prior to completing conversation Ms. Mulkern disconnected the line. Caple, Charlyne Quale

## 2014-11-18 NOTE — Telephone Encounter (Signed)
Pharmacist called stating that patients insurance does not cover FLUoxetine (PROZAC) 20 MG tablet in the tablet form but it does cover the capsules. If you are able to change this please send over to patients pharmacy ([facility] in [place]). Thank you.

## 2014-11-18 NOTE — Progress Notes (Signed)
Pt's here for f/up for back pain. Pt describes her pain 9/10. Pain described as shooting, dull, throbbing.  Pt reports pain behind R calf.  Pt vomiting dry heaving. Pt states that she's having pain in her mouth x51mo.

## 2014-11-18 NOTE — Patient Instructions (Signed)

## 2014-11-19 LAB — DRUG SCR UR, PAIN MGMT, REFLEX CONF
Amphetamine Screen, Ur: NEGATIVE
Benzodiazepines.: NEGATIVE
Cocaine Metabolites: NEGATIVE
Creatinine,U: 272.8 mg/dL
Methadone: NEGATIVE
Phencyclidine (PCP): NEGATIVE
Propoxyphene: NEGATIVE

## 2014-11-19 MED ORDER — FLUOXETINE HCL 20 MG PO CAPS: 20.0000 mg | ORAL_CAPSULE | Freq: Every day | ORAL | Status: DC

## 2014-11-19 NOTE — Progress Notes (Signed)
CC: Follow up from hospitalization (10/08/14-10/16/14)  HPI: Tanya Krause is a 29 y.o. female with a history of fibromyalgia, chronic migraines who had presented to [facility] ED with lower extremity weakness, tingling and numbness as well as nausea and vomiting.  Workup revealed chronic B12 deficiency with a level of 91 for which she was commenced on vitamin B12 replacement; MRI thoracic and lumbar spine were negative for spinal cord compression. Neurontin was commenced as per Neurology recommendations. CT abdomen and pelvis was unremarkable and she was placed on antiemetics and PPI. Seen by rehab and deemed not to be a candidate for comprehensive inpatient rehab.She was subsequently discharged on Oxycodone tablets.  Interval history: She complains of weakness in lower extremities necessitating ambulation by means of a walker, tremors, burning sensation in her legs which all started within a space of the last 6 weeks. States she was diagnosed with fibromyalgia in [facility] and was receiving Percocets from pain management but had to return to [place] to live with her parents when her symptoms began and up until then she was ambulating without assistive devices. States she received Xanax for her "shakes" which calmed her. Also has nausea and vomiting and would like Promethazine suspension as the tabs do not work for her. Admits to some anxiety and depressive symptoms. Also complains of oral thrush.  She was seen by [facility] where her migraine is managed and other imaging studies were ordered; Elavil was increased to 50mg .   Allergies  Allergen Reactions  . Tomato Shortness Of Breath, Swelling and Rash  . Aspirin Hives  . Doxycycline Nausea And Vomiting  . Hydromorphone Hcl Itching    Patient can take but has to have benadryl in addition to  . Ibuprofen Hives  . Metoclopramide Other (See Comments)    "makes me feel jittery"  . Other Hives    [place]  . Shellfish  Allergy Hives  . Toradol [Ketorolac Tromethamine] Hives  . Latex Itching and Rash    Vaginal Only   Past Medical History  Diagnosis Date  . Hypertension   . Asthma   . Pelvic inflammatory disease (PID)   . Sickle cell disease (HCC)   . Fibromyalgia   . Chronic abdominal pain   . Migraine    Current Outpatient Prescriptions on File Prior to Visit  Medication Sig Dispense Refill  . albuterol (PROVENTIL HFA;VENTOLIN HFA) 108 (90 BASE) MCG/ACT inhaler Inhale 2 puffs into the lungs every 6 (six) hours as needed for wheezing or shortness of breath.    Marland Kitchen amitriptyline (ELAVIL) 50 MG tablet Take 1 tablet (50 mg total) by mouth at bedtime. 30 tablet 6  . butalbital-acetaminophen-caffeine (FIORICET, ESGIC) 50-325-40 MG tablet Take 2 tablets by mouth every 6 (six) hours as needed for headache. 30 tablet 0  . diphenhydrAMINE (BENADRYL) 25 mg capsule Take 1 capsule (25 mg total) by mouth every 6 (six) hours as needed for itching. Also available OTC 60 capsule 0  . feeding supplement, ENSURE ENLIVE, (ENSURE ENLIVE) LIQD Take 237 mLs by mouth 3 (three) times daily between meals. 30 Bottle 1  . folic acid (FOLVITE) 1 MG tablet Take 1 tablet (1 mg total) by mouth daily. 30 tablet 2  . nicotine (NICODERM CQ - DOSED IN MG/24 HOURS) 21 mg/24hr patch Place 1 patch (21 mg total) onto the skin daily. 28 patch 1  . oxyCODONE (OXY IR/ROXICODONE) 5 MG immediate release tablet Take 1 tablet (5 mg total) by mouth every 4 (four) hours as  needed for severe pain or breakthrough pain. 30 tablet 0  . oxyCODONE-acetaminophen (PERCOCET/ROXICET) 5-325 MG tablet Take 1-2 tablets by mouth every 4 (four) hours as needed for moderate pain. 30 tablet 0  . polyethylene glycol (MIRALAX) packet Take 17 g by mouth daily. 30 each 3  . UNABLE TO FIND Outpatient physical therapy  Diagnosis: lower extremity weakness, B12 deficiency 1 Mutually Defined 0   No current facility-administered medications on file prior to visit.   Family  History  Problem Relation Age of Onset  . Hypertension Mother   . Diabetes Mother   . Heart failure Father    Social History   Social History  . Marital Status: Divorced    Spouse Name: N/A  . Number of Children: 4  . Years of Education: 12   Occupational History  .      unemployed   Social History Main Topics  . Smoking status: Light Tobacco Smoker    Types: Cigarettes  . Smokeless tobacco: Never Used     Comment: Occasional smoker  . Alcohol Use: Yes     Comment: occasionally-once a month  . Drug Use: No  . Sexual Activity: Yes    Birth Control/ Protection: Surgical   Other Topics Concern  . Not on file   Social History Narrative   Lives with parents, children   Caffeine use- coffee once a day, sodas x 2 a day    Review of Systems: Constitutional: Negative for fever, chills, diaphoresis, activity change, appetite change and fatigue. HENT: Negative for ear pain, nosebleeds, congestion, facial swelling, rhinorrhea, neck pain, neck stiffness and ear discharge.  Eyes: Negative for pain, discharge, redness, itching and visual disturbance. Respiratory: Negative for cough, choking, chest tightness, shortness of breath, wheezing and stridor.  Cardiovascular: Negative for chest pain, palpitations and leg swelling. Gastrointestinal: Negative for abdominal distention. Genitourinary: Negative for dysuria, urgency, frequency, hematuria, flank pain, decreased urine volume, difficulty urinating and dyspareunia.  Musculoskeletal: Negative for back pain, joint swelling, arthralgias and gait problem. Neurological: Negative for dizziness, tremors, seizures, syncope, facial asymmetry, speech difficulty, weakness, light-headedness, numbness and headaches.  Hematological: Negative for adenopathy. Does not bruise/bleed easily. Psychiatric/Behavioral: Negative for hallucinations, behavioral problems, confusion, positive for dysphoric mood, decreased concentration and agitation.     Objective:   Filed Vitals:   11/18/14 1216  BP: 134/88  Pulse: 92  Temp: 98.5 F (36.9 C)  Resp: 18    Physical Exam: Constitutional: Patient appears well-developed and well-nourished. No distress. HENT: Normocephalic, atraumatic, External right and left ear normal. Thrush on tongue.  Eyes: Conjunctivae and EOM are normal. PERRLA, no scleral icterus. Neck: Normal ROM. Neck supple. No JVD. No tracheal deviation. No thyromegaly. CVS: RRR, S1/S2 +, no murmurs, no gallops, no carotid bruit.  Pulmonary: Effort and breath sounds normal, no stridor, rhonchi, wheezes, rales.  Abdominal: Soft. BS +,  no distension, tenderness, rebound or guarding.  Musculoskeletal: Normal range of motion. No edema and no tenderness.  Lymphadenopathy: No lymphadenopathy noted, cervical, inguinal or axillary Neuro: Alert.tremors noted more on right side; motor strength 3/5 in lower extremities bilaterally; 4/5 in upper extremities bilaterally Skin: Skin is warm and dry. No rash noted. Not diaphoretic. No erythema. No pallor. Psychiatric: Normal mood and affect. Behavior, judgment, thought content normal.  Lab Results  Component Value Date   WBC 8.4 10/12/2014   HGB 12.3 10/12/2014   HCT 35.3* 10/12/2014   MCV 80.4 10/12/2014   PLT 186 10/12/2014   Lab Results  Component Value  Date   CREATININE 0.62 10/16/2014   BUN <5* 10/16/2014   NA 137 10/16/2014   K 3.8 10/16/2014   CL 103 10/16/2014   CO2 28 10/16/2014        Assessment and plan:  Fibromyalgia/chronic pain syndrome: Scheduled to see [place] pain management on 12/24/14. I have explained the pain policy of clinic to her and will be placing her on Tylenol #3 until her appointment with the pain clinic.  Migraine: Uncontrolled on Elavil and Fioricet She is currently managed by [facility] neurology and I Will defer changes in regimen to them.  Weakness of lower extremities: She has unexplainable lower extremity weakness and tremors  and has upcoming appointments for imaging of the spine which were ordered by neurology. She is high risk for falls and will continue to use a walker for ambulation.  Oral thrush: Placed on nystatin drops.  Depression and anxiety: She is requesting Xanax which I have informed I will be unable to give her that I have placed her on an SSRI.  GERD: Remains on Protonix. She is still experiencing nausea and so I will place her on promethazine suspension which she states works better than the pills and if symptoms persist she will need an EGD and evaluation for H. pylori.      Jaclyn Shaggy, MD. [facility] [phone] 11/19/2014, 8:31 AM

## 2014-11-23 LAB — BARBITURATES (GC/LC/MS), URINE
Amobarbital: NEGATIVE ng/mL (ref <100)
BUTALBITAL GC/MS, URINE: 4481 ng/mL — AB (ref <100)
PHENOBARBITAL GC/MS, URINE: NEGATIVE ng/mL (ref <100)
Pentabarbital: NEGATIVE ng/mL (ref <100)
SECOBARBITAL GC/MS, URINE: NEGATIVE ng/mL (ref <100)

## 2014-11-23 LAB — OPIATES/OPIOIDS (LC/MS-MS)
CODEINE URINE: 1043 ng/mL — AB (ref <50)
Hydrocodone: 104 ng/mL — ABNORMAL HIGH (ref <50)
Hydromorphone: 137 ng/mL — ABNORMAL HIGH (ref <50)
MORPHINE: 2141 ng/mL — AB (ref <50)
Norhydrocodone, Ur: 810 ng/mL — ABNORMAL HIGH (ref <50)
Noroxycodone, Ur: NEGATIVE ng/mL (ref <50)
OXYCODONE, UR: NEGATIVE ng/mL (ref <50)
OXYMORPHONE, URINE: NEGATIVE ng/mL (ref <50)

## 2014-11-23 LAB — CANNABANOIDS (GC/LC/MS), URINE: THC-COOH (GC/LC/MS), ur confirm: 541 ng/mL — ABNORMAL HIGH (ref <5)

## 2014-12-02 ENCOUNTER — Telehealth: Payer: Self-pay | Admitting: Diagnostic Neuroimaging

## 2014-12-02 ENCOUNTER — Encounter (HOSPITAL_COMMUNITY): Payer: Self-pay | Admitting: Emergency Medicine

## 2014-12-02 ENCOUNTER — Emergency Department (HOSPITAL_COMMUNITY)
Admission: EM | Admit: 2014-12-02 | Discharge: 2014-12-02 | Disposition: A | Discharge status: Home / Self Care | Payer: Medicaid Other | Attending: Emergency Medicine | Admitting: Emergency Medicine

## 2014-12-02 DIAGNOSIS — I10 Essential (primary) hypertension: Secondary | ICD-10-CM | POA: Diagnosis not present

## 2014-12-02 DIAGNOSIS — F17210 Nicotine dependence, cigarettes, uncomplicated: Secondary | ICD-10-CM | POA: Diagnosis not present

## 2014-12-02 DIAGNOSIS — Z91040 Latex allergy status: Secondary | ICD-10-CM | POA: Diagnosis not present

## 2014-12-02 DIAGNOSIS — G43909 Migraine, unspecified, not intractable, without status migrainosus: Secondary | ICD-10-CM | POA: Insufficient documentation

## 2014-12-02 DIAGNOSIS — R112 Nausea with vomiting, unspecified: Secondary | ICD-10-CM | POA: Diagnosis present

## 2014-12-02 DIAGNOSIS — F418 Other specified anxiety disorders: Secondary | ICD-10-CM

## 2014-12-02 DIAGNOSIS — M797 Fibromyalgia: Secondary | ICD-10-CM | POA: Diagnosis not present

## 2014-12-02 DIAGNOSIS — G8929 Other chronic pain: Secondary | ICD-10-CM | POA: Insufficient documentation

## 2014-12-02 DIAGNOSIS — Z862 Personal history of diseases of the blood and blood-forming organs and certain disorders involving the immune mechanism: Secondary | ICD-10-CM | POA: Diagnosis not present

## 2014-12-02 DIAGNOSIS — J45909 Unspecified asthma, uncomplicated: Secondary | ICD-10-CM | POA: Diagnosis not present

## 2014-12-02 DIAGNOSIS — Z8742 Personal history of other diseases of the female genital tract: Secondary | ICD-10-CM | POA: Diagnosis not present

## 2014-12-02 DIAGNOSIS — Z79899 Other long term (current) drug therapy: Secondary | ICD-10-CM | POA: Diagnosis not present

## 2014-12-02 LAB — CBC WITH DIFFERENTIAL/PLATELET
Basophils Absolute: 0 10*3/uL (ref 0.0–0.1)
Basophils Relative: 1 %
EOS ABS: 0.2 10*3/uL (ref 0.0–0.7)
EOS PCT: 3 %
HCT: 36.7 % (ref 36.0–46.0)
Hemoglobin: 12.9 g/dL (ref 12.0–15.0)
LYMPHS ABS: 3.1 10*3/uL (ref 0.7–4.0)
Lymphocytes Relative: 37 %
MCH: 28.7 pg (ref 26.0–34.0)
MCHC: 35.1 g/dL (ref 30.0–36.0)
MCV: 81.7 fL (ref 78.0–100.0)
MONOS PCT: 7 %
Monocytes Absolute: 0.6 10*3/uL (ref 0.1–1.0)
Neutro Abs: 4.4 10*3/uL (ref 1.7–7.7)
Neutrophils Relative %: 52 %
PLATELETS: 227 10*3/uL (ref 150–400)
RBC: 4.49 MIL/uL (ref 3.87–5.11)
RDW: 13.5 % (ref 11.5–15.5)
WBC: 8.4 10*3/uL (ref 4.0–10.5)

## 2014-12-02 LAB — COMPREHENSIVE METABOLIC PANEL
ALT: 16 U/L (ref 14–54)
ANION GAP: 7 (ref 5–15)
AST: 21 U/L (ref 15–41)
Albumin: 4.5 g/dL (ref 3.5–5.0)
Alkaline Phosphatase: 50 U/L (ref 38–126)
BUN: 7 mg/dL (ref 6–20)
CHLORIDE: 104 mmol/L (ref 101–111)
CO2: 25 mmol/L (ref 22–32)
Calcium: 9.4 mg/dL (ref 8.9–10.3)
Creatinine, Ser: 0.67 mg/dL (ref 0.44–1.00)
GFR calc non Af Amer: >60 mL/min (ref >60)
Glucose, Bld: 91 mg/dL (ref 65–99)
POTASSIUM: 3.6 mmol/L (ref 3.5–5.1)
SODIUM: 136 mmol/L (ref 135–145)
Total Bilirubin: 0.4 mg/dL (ref 0.3–1.2)
Total Protein: 8.1 g/dL (ref 6.5–8.1)

## 2014-12-02 LAB — LACTIC ACID, PLASMA
LACTIC ACID, VENOUS: 0.6 mmol/L (ref 0.5–2.0)
Lactic Acid, Venous: 1.1 mmol/L (ref 0.5–2.0)

## 2014-12-02 LAB — RETICULOCYTES
RBC.: 4.45 MIL/uL (ref 3.87–5.11)
RETIC COUNT ABSOLUTE: 80.1 10*3/uL (ref 19.0–186.0)
RETIC CT PCT: 1.8 % (ref 0.4–3.1)

## 2014-12-02 MED ORDER — SODIUM CHLORIDE 0.9 % IV SOLN: INTRAVENOUS | Status: DC

## 2014-12-02 MED ORDER — DIPHENHYDRAMINE HCL 50 MG/ML IJ SOLN
25.0000 mg | Freq: Once | INTRAMUSCULAR | Status: AC
Start: 2014-12-02 — End: 2014-12-02
  Administered 2014-12-02: 25 mg via INTRAVENOUS
  Filled 2014-12-02: qty 1

## 2014-12-02 MED ORDER — HYDROMORPHONE HCL 1 MG/ML IJ SOLN
1.0000 mg | Freq: Once | INTRAMUSCULAR | Status: AC
  Administered 2014-12-02: 1 mg via INTRAVENOUS
  Filled 2014-12-02: qty 1

## 2014-12-02 MED ORDER — PROMETHAZINE HCL 25 MG/ML IJ SOLN
12.5000 mg | Freq: Once | INTRAMUSCULAR | Status: AC
  Administered 2014-12-02: 12.5 mg via INTRAVENOUS
  Filled 2014-12-02: qty 1

## 2014-12-02 MED ORDER — ALPRAZOLAM 0.5 MG PO TABS: ORAL_TABLET | ORAL | Status: DC

## 2014-12-02 MED ORDER — SODIUM CHLORIDE 0.9 % IV BOLUS (SEPSIS)
1000.0000 mL | Freq: Once | INTRAVENOUS | Status: AC
  Administered 2014-12-02: 1000 mL via INTRAVENOUS

## 2014-12-02 MED ORDER — HYDROMORPHONE HCL 1 MG/ML IJ SOLN
2.0000 mg | Freq: Once | INTRAMUSCULAR | Status: AC
  Administered 2014-12-02: 2 mg via INTRAVENOUS
  Filled 2014-12-02: qty 2

## 2014-12-02 MED ORDER — DIPHENHYDRAMINE HCL 50 MG/ML IJ SOLN
25.0000 mg | Freq: Once | INTRAMUSCULAR | Status: AC
  Administered 2014-12-02: 25 mg via INTRAVENOUS
  Filled 2014-12-02: qty 1

## 2014-12-02 MED ORDER — PROMETHAZINE HCL 25 MG PO TABS: 25.0000 mg | ORAL_TABLET | Freq: Four times a day (QID) | ORAL | Status: DC | PRN

## 2014-12-02 NOTE — Telephone Encounter (Signed)
pls give her the xanax pack for mri. -VRP

## 2014-12-02 NOTE — Discharge Instructions (Signed)
Take the antinausea medicine as directed. Return as needed for any new or worse symptoms. Make a follow-up with your doctor.

## 2014-12-02 NOTE — Telephone Encounter (Signed)
Called pt back. Advised Rx xanax sent to pharmacy as requested. Ok per Dr Marjory Lies. Pt verbalized understanding.

## 2014-12-02 NOTE — Telephone Encounter (Signed)
Patient is calling as she is having her MRI tomorrow morning and needs something to take for anxiety sent to [facility].  Please call.  Leave message if needed.

## 2014-12-02 NOTE — ED Provider Notes (Signed)
CSN: [id number]     Arrival date & time 12/02/14  1342 History   First MD Initiated Contact with Patient 12/02/14 1542     Chief Complaint  Patient presents with  . Emesis     (Consider location/radiation/quality/duration/timing/severity/associated sxs/prior Treatment) Patient is a 29 y.o. female presenting with vomiting. The history is provided by the patient.  Emesis Associated symptoms: myalgias   Associated symptoms: no abdominal pain, no diarrhea and no headaches    patient with history of sickle cell disease and fibromyalgia. Patient with complaint of body aches generalized similar to her fibromyalgia since Friday associated with nausea and vomiting. Patient does not feel that this is like her sickle cell pain. No fevers no blood in the vomit. No diarrhea. Pain is 10 out of 10.  Past Medical History  Diagnosis Date  . Hypertension   . Asthma   . Pelvic inflammatory disease (PID)   . Sickle cell disease (HCC)   . Fibromyalgia   . Chronic abdominal pain   . Migraine    Past Surgical History  Procedure Laterality Date  . Tubal ligation    . Kidney infections    . Wisdom tooth extraction     Family History  Problem Relation Age of Onset  . Hypertension Mother   . Diabetes Mother   . Heart failure Father    Social History  Substance Use Topics  . Smoking status: Light Tobacco Smoker    Types: Cigarettes  . Smokeless tobacco: Never Used     Comment: Occasional smoker  . Alcohol Use: Yes     Comment: occasionally-once a month   OB History    Gravida Para Term Preterm AB TAB SAB Ectopic Multiple Living   4 4 1 3      4      Review of Systems  Constitutional: Negative for fever.  HENT: Negative for congestion.   Eyes: Negative for visual disturbance.  Respiratory: Negative for shortness of breath.   Cardiovascular: Negative for chest pain.  Gastrointestinal: Positive for nausea and vomiting. Negative for abdominal pain and diarrhea.  Genitourinary: Negative for  dysuria.  Musculoskeletal: Positive for myalgias.  Skin: Negative for rash.  Neurological: Negative for headaches.  Hematological: Does not bruise/bleed easily.  Psychiatric/Behavioral: Negative for confusion.  All other systems reviewed and are negative.     Allergies  Tomato; Aspirin; Doxycycline; Hydromorphone hcl; Ibuprofen; Metoclopramide; Other; Shellfish allergy; Toradol; and Latex  Home Medications   Prior to Admission medications   Medication Sig Start Date End Date Taking? Authorizing Provider  acetaminophen-codeine (TYLENOL #3) 300-30 MG tablet Take 1 tablet by mouth every 12 (twelve) hours as needed for moderate pain. 11/18/14  Yes 13/1/16, MD  albuterol (PROVENTIL HFA;VENTOLIN HFA) 108 (90 BASE) MCG/ACT inhaler Inhale 2 puffs into the lungs every 6 (six) hours as needed for wheezing or shortness of breath. 01/14/12  Yes 01/16/12, MD  amitriptyline (ELAVIL) 50 MG tablet Take 1 tablet (50 mg total) by mouth at bedtime. 11/17/14  Yes 11/19/14, MD  butalbital-acetaminophen-caffeine (FIORICET, ESGIC) 50-325-40 MG tablet Take 2 tablets by mouth every 6 (six) hours as needed for headache. 10/17/14  Yes 10/19/14, MD  cyanocobalamin 1000 MCG tablet Take 1 tablet (1,000 mcg total) by mouth daily. 11/18/14  Yes 13/1/16, MD  cyclobenzaprine (FLEXERIL) 10 MG tablet Take 1 tablet (10 mg total) by mouth 3 (three) times daily as needed for muscle spasms. 11/18/14  Yes 13/1/16, MD  diphenhydrAMINE (  BENADRYL) 25 mg capsule Take 1 capsule (25 mg total) by mouth every 6 (six) hours as needed for itching. Also available OTC 10/17/14  Yes Jerald Kief, MD  feeding supplement, ENSURE ENLIVE, (ENSURE ENLIVE) LIQD Take 237 mLs by mouth 3 (three) times daily between meals. 10/17/14  Yes Jerald Kief, MD  FLUoxetine (PROZAC) 20 MG capsule Take 1 capsule (20 mg total) by mouth daily. 11/19/14  Yes Jaclyn Shaggy, MD  folic acid (FOLVITE) 1 MG tablet Take 1 tablet (1 mg  total) by mouth daily. 10/17/14  Yes Jerald Kief, MD  gabapentin (NEURONTIN) 300 MG capsule Take 1 capsule (300 mg total) by mouth 3 (three) times daily. 11/18/14  Yes Jaclyn Shaggy, MD  magnesium oxide (MAG-OX) 400 (241.3 MG) MG tablet Take 1 tablet (400 mg total) by mouth 2 (two) times daily. 11/18/14  Yes Jaclyn Shaggy, MD  nicotine (NICODERM CQ - DOSED IN MG/24 HOURS) 21 mg/24hr patch Place 1 patch (21 mg total) onto the skin daily. 10/17/14  Yes Jerald Kief, MD  nystatin (MYCOSTATIN) 100000 UNIT/ML suspension Take 5 mLs (500,000 Units total) by mouth 4 (four) times daily. 11/18/14  Yes Jaclyn Shaggy, MD  oxyCODONE (OXY IR/ROXICODONE) 5 MG immediate release tablet Take 1 tablet (5 mg total) by mouth every 4 (four) hours as needed for severe pain or breakthrough pain. 10/17/14  Yes Jerald Kief, MD  oxyCODONE-acetaminophen (PERCOCET/ROXICET) 5-325 MG tablet Take 1-2 tablets by mouth every 4 (four) hours as needed for moderate pain. 10/17/14  Yes Jerald Kief, MD  pantoprazole (PROTONIX) 40 MG tablet Take 1 tablet (40 mg total) by mouth daily. 11/18/14  Yes Jaclyn Shaggy, MD  polyethylene glycol (MIRALAX) packet Take 17 g by mouth daily. 10/17/14  Yes Jerald Kief, MD  promethazine (PHENERGAN) 25 MG tablet TK 1 T PO  Q 6 H PRN NV 10/17/14  Yes Historical Provider, MD  promethazine (PHENERGAN) 6.25 MG/5ML syrup Take 10 mLs (12.5 mg total) by mouth every 6 (six) hours as needed for nausea or vomiting. 11/18/14  Yes Jaclyn Shaggy, MD  ALPRAZolam Prudy Feeler) 0.5 MG tablet Take 1-2 tablets prior to test. You can take additional tablet at time of test if needed. Must have someone drive you to and from. Medication can cause drowsiness. 12/02/14   Suanne Marker, MD  promethazine (PHENERGAN) 25 MG tablet Take 1 tablet (25 mg total) by mouth every 6 (six) hours as needed. 12/02/14   Vanetta Mulders, MD  UNABLE TO FIND Outpatient physical therapy  Diagnosis: lower extremity weakness, B12 deficiency 10/16/14    Ripudeep K Rai, MD   BP 133/94 mmHg  Pulse 107  Temp(Src) 98.5 F (36.9 C) (Oral)  Resp 18  Ht 5' (1.524 m)  Wt 149 lb (67.586 kg)  BMI 29.10 kg/m2  SpO2 98%  LMP 12/02/2014 Physical Exam  Constitutional: She is oriented to person, place, and time. She appears well-developed and well-nourished. No distress.  HENT:  Head: Normocephalic and atraumatic.  Mucous membranes slightly dry.  Eyes: Conjunctivae and EOM are normal. Pupils are equal, round, and reactive to light.  Neck: Normal range of motion.  Pulmonary/Chest: Effort normal and breath sounds normal.  Abdominal: Soft. Bowel sounds are normal. There is no tenderness.  Musculoskeletal: Normal range of motion. She exhibits no edema.  Neurological: She is alert and oriented to person, place, and time. No cranial nerve deficit. She exhibits normal muscle tone. Coordination normal.  Skin: Skin is warm. No rash  noted.  Nursing note and vitals reviewed.   ED Course  Procedures (including critical care time) Labs Review Labs Reviewed  CBC WITH DIFFERENTIAL/PLATELET  COMPREHENSIVE METABOLIC PANEL  LACTIC ACID, PLASMA  RETICULOCYTES  LACTIC ACID, PLASMA  URINALYSIS, ROUTINE W REFLEX MICROSCOPIC (NOT AT [facility])   Results for orders placed or performed during the hospital encounter of 12/02/14  CBC with Differential  Result Value Ref Range   WBC 8.4 4.0 - 10.5 K/uL   RBC 4.49 3.87 - 5.11 MIL/uL   Hemoglobin 12.9 12.0 - 15.0 g/dL   HCT 22.2 97.9 - 89.2 %   MCV 81.7 78.0 - 100.0 fL   MCH 28.7 26.0 - 34.0 pg   MCHC 35.1 30.0 - 36.0 g/dL   RDW 11.9 41.7 - 40.8 %   Platelets 227 150 - 400 K/uL   Neutrophils Relative % 52 %   Neutro Abs 4.4 1.7 - 7.7 K/uL   Lymphocytes Relative 37 %   Lymphs Abs 3.1 0.7 - 4.0 K/uL   Monocytes Relative 7 %   Monocytes Absolute 0.6 0.1 - 1.0 K/uL   Eosinophils Relative 3 %   Eosinophils Absolute 0.2 0.0 - 0.7 K/uL   Basophils Relative 1 %   Basophils Absolute 0.0 0.0 - 0.1 K/uL   Comprehensive metabolic panel  Result Value Ref Range   Sodium 136 135 - 145 mmol/L   Potassium 3.6 3.5 - 5.1 mmol/L   Chloride 104 101 - 111 mmol/L   CO2 25 22 - 32 mmol/L   Glucose, Bld 91 65 - 99 mg/dL   BUN 7 6 - 20 mg/dL   Creatinine, Ser 1.44 0.44 - 1.00 mg/dL   Calcium 9.4 8.9 - 81.8 mg/dL   Total Protein 8.1 6.5 - 8.1 g/dL   Albumin 4.5 3.5 - 5.0 g/dL   AST 21 15 - 41 U/L   ALT 16 14 - 54 U/L   Alkaline Phosphatase 50 38 - 126 U/L   Total Bilirubin 0.4 0.3 - 1.2 mg/dL   GFR calc non Af Amer >60 >60 mL/min   GFR calc Af Amer >60 >60 mL/min   Anion gap 7 5 - 15  Lactic acid, plasma  Result Value Ref Range   Lactic Acid, Venous 1.1 0.5 - 2.0 mmol/L  Reticulocytes  Result Value Ref Range   Retic Ct Pct 1.8 0.4 - 3.1 %   RBC. 4.45 3.87 - 5.11 MIL/uL   Retic Count, Manual 80.1 19.0 - 186.0 K/uL     Imaging Review No results found. I have personally reviewed and evaluated these images and lab results as part of my medical decision-making.   EKG Interpretation None      MDM   Final diagnoses:  Fibromyalgia    Patient improved here with pain medication and Phenergan. Patient with no further vomiting here. Electrolytes without any significant abnormalities. Patient also received IV hydration. Patient we discharged home with Phenergan. Symptoms seem to be consistent more with fibromyalgia. Protect cell count is not elevated not think this is a sickle cell type pain. Patient nontoxic no acute distress.    Vanetta Mulders, MD 12/02/14 [id number]

## 2014-12-02 NOTE — ED Notes (Signed)
Pt reports emesis and generalized body aches since Fri.

## 2014-12-03 ENCOUNTER — Inpatient Hospital Stay: Admission: RE | Admit: 2014-12-03 | Payer: Medicaid - Out of State | Source: Ambulatory Visit

## 2014-12-03 ENCOUNTER — Other Ambulatory Visit: Payer: Medicaid - Out of State

## 2014-12-03 ENCOUNTER — Telehealth: Payer: Self-pay | Admitting: Diagnostic Neuroimaging

## 2014-12-03 NOTE — Telephone Encounter (Signed)
Spoke with patient and informed her that Dr Marjory Lies will not prescribe xanax at this time. Will wait for MRI results. Patient verbalized understanding.

## 2014-12-03 NOTE — Telephone Encounter (Signed)
Pt called and states her MRI was cancelled due to her insurance. She says she would like to have a rx of ALPRAZolam (XANAX) 0.5 MG tablet for daily use. She says her MRI is to be rescheduled in a couple of days. Please call and advise [phone]

## 2014-12-03 NOTE — Telephone Encounter (Signed)
No long term xanax rx. Needs MRI. -VRP

## 2014-12-03 NOTE — Telephone Encounter (Signed)
Spoke with patient who states she took Xanax this morning in anticipation of having MRI. However her MRI was cancelled due to insurance issue. She states she is expecting a call back to reschedule her MRI. She states that ever since she took Xanax her tremors have stopped. She is now requesting prescription for Xanax to take daily to control her tremors. Informed her would route her request to Dr Marjory Lies and call her back by tomorrow with his response. She verbalized understanding, appreciation.

## 2014-12-05 ENCOUNTER — Emergency Department (HOSPITAL_COMMUNITY)
Admission: EM | Admit: 2014-12-05 | Discharge: 2014-12-06 | Disposition: A | Discharge status: Home / Self Care | Payer: Medicaid Other | Attending: Emergency Medicine | Admitting: Emergency Medicine

## 2014-12-05 ENCOUNTER — Encounter (HOSPITAL_COMMUNITY): Payer: Self-pay

## 2014-12-05 ENCOUNTER — Emergency Department (HOSPITAL_COMMUNITY): Payer: Medicaid Other

## 2014-12-05 DIAGNOSIS — G43909 Migraine, unspecified, not intractable, without status migrainosus: Secondary | ICD-10-CM | POA: Insufficient documentation

## 2014-12-05 DIAGNOSIS — Z8742 Personal history of other diseases of the female genital tract: Secondary | ICD-10-CM | POA: Diagnosis not present

## 2014-12-05 DIAGNOSIS — I10 Essential (primary) hypertension: Secondary | ICD-10-CM | POA: Diagnosis not present

## 2014-12-05 DIAGNOSIS — F17210 Nicotine dependence, cigarettes, uncomplicated: Secondary | ICD-10-CM | POA: Insufficient documentation

## 2014-12-05 DIAGNOSIS — Z862 Personal history of diseases of the blood and blood-forming organs and certain disorders involving the immune mechanism: Secondary | ICD-10-CM | POA: Insufficient documentation

## 2014-12-05 DIAGNOSIS — Z91040 Latex allergy status: Secondary | ICD-10-CM | POA: Insufficient documentation

## 2014-12-05 DIAGNOSIS — R1110 Vomiting, unspecified: Secondary | ICD-10-CM | POA: Diagnosis present

## 2014-12-05 DIAGNOSIS — Z79899 Other long term (current) drug therapy: Secondary | ICD-10-CM | POA: Insufficient documentation

## 2014-12-05 DIAGNOSIS — G8929 Other chronic pain: Secondary | ICD-10-CM | POA: Insufficient documentation

## 2014-12-05 DIAGNOSIS — M797 Fibromyalgia: Secondary | ICD-10-CM | POA: Insufficient documentation

## 2014-12-05 DIAGNOSIS — J45909 Unspecified asthma, uncomplicated: Secondary | ICD-10-CM | POA: Insufficient documentation

## 2014-12-05 DIAGNOSIS — E860 Dehydration: Secondary | ICD-10-CM

## 2014-12-05 LAB — COMPREHENSIVE METABOLIC PANEL
ALK PHOS: 47 U/L (ref 38–126)
ALT: 18 U/L (ref 14–54)
AST: 24 U/L (ref 15–41)
Albumin: 3.6 g/dL (ref 3.5–5.0)
Anion gap: 6 (ref 5–15)
BUN: 8 mg/dL (ref 6–20)
CHLORIDE: 109 mmol/L (ref 101–111)
CO2: 26 mmol/L (ref 22–32)
Calcium: 8.9 mg/dL (ref 8.9–10.3)
Creatinine, Ser: 0.67 mg/dL (ref 0.44–1.00)
GFR calc non Af Amer: >60 mL/min (ref >60)
GLUCOSE: 107 mg/dL — AB (ref 65–99)
Potassium: 4.4 mmol/L (ref 3.5–5.1)
SODIUM: 141 mmol/L (ref 135–145)
Total Bilirubin: 0.4 mg/dL (ref 0.3–1.2)
Total Protein: 6.6 g/dL (ref 6.5–8.1)

## 2014-12-05 LAB — CBC WITH DIFFERENTIAL/PLATELET
BASOS ABS: 0 10*3/uL (ref 0.0–0.1)
Basophils Relative: 1 %
EOS ABS: 0.3 10*3/uL (ref 0.0–0.7)
Eosinophils Relative: 5 %
HCT: 38.1 % (ref 36.0–46.0)
HEMOGLOBIN: 11.2 g/dL — AB (ref 12.0–15.0)
LYMPHS ABS: 2.6 10*3/uL (ref 0.7–4.0)
Lymphocytes Relative: 42 %
MCH: 28.6 pg (ref 26.0–34.0)
MCHC: 29.4 g/dL — ABNORMAL LOW (ref 30.0–36.0)
MCV: 97.2 fL (ref 78.0–100.0)
Monocytes Absolute: 0.8 10*3/uL (ref 0.1–1.0)
Monocytes Relative: 12 %
NEUTROS PCT: 40 %
Neutro Abs: 2.5 10*3/uL (ref 1.7–7.7)
PLATELETS: 228 10*3/uL (ref 150–400)
RBC: 3.92 MIL/uL (ref 3.87–5.11)
RDW: 13.6 % (ref 11.5–15.5)
WBC: 6.3 10*3/uL (ref 4.0–10.5)

## 2014-12-05 LAB — URINALYSIS, ROUTINE W REFLEX MICROSCOPIC
BILIRUBIN URINE: NEGATIVE
Glucose, UA: NEGATIVE mg/dL
Hgb urine dipstick: NEGATIVE
Ketones, ur: NEGATIVE mg/dL
LEUKOCYTES UA: NEGATIVE
NITRITE: NEGATIVE
Protein, ur: NEGATIVE mg/dL
SPECIFIC GRAVITY, URINE: 1.02 (ref 1.005–1.030)
pH: 8 (ref 5.0–8.0)

## 2014-12-05 MED ORDER — ONDANSETRON HCL 4 MG/2ML IJ SOLN
4.0000 mg | Freq: Once | INTRAMUSCULAR | Status: AC
  Administered 2014-12-05: 4 mg via INTRAVENOUS
  Filled 2014-12-05: qty 2

## 2014-12-05 MED ORDER — MORPHINE SULFATE (PF) 4 MG/ML IV SOLN
4.0000 mg | Freq: Once | INTRAVENOUS | Status: AC
  Administered 2014-12-05: 4 mg via NASAL

## 2014-12-05 MED ORDER — SODIUM CHLORIDE 0.9 % IV BOLUS (SEPSIS)
1000.0000 mL | Freq: Once | INTRAVENOUS | Status: AC
  Administered 2014-12-05: 1000 mL via INTRAVENOUS

## 2014-12-05 MED ORDER — MORPHINE SULFATE (PF) 4 MG/ML IV SOLN
4.0000 mg | Freq: Once | INTRAVENOUS | Status: AC
  Administered 2014-12-05: 4 mg via INTRAVENOUS
  Filled 2014-12-05: qty 1

## 2014-12-05 MED ORDER — PROMETHAZINE HCL 25 MG PO TABS: 25.0000 mg | ORAL_TABLET | Freq: Four times a day (QID) | ORAL | Status: DC | PRN

## 2014-12-05 MED ORDER — MORPHINE SULFATE (PF) 4 MG/ML IV SOLN
INTRAVENOUS | Status: AC
  Administered 2014-12-05: 4 mg via NASAL
  Filled 2014-12-05: qty 1

## 2014-12-05 MED ORDER — HYDROMORPHONE HCL 1 MG/ML IJ SOLN: 1.0000 mg | Freq: Once | INTRAMUSCULAR | Status: DC

## 2014-12-05 MED ORDER — SODIUM CHLORIDE 0.9 % IV BOLUS (SEPSIS)
1000.0000 mL | Freq: Once | INTRAVENOUS | Status: AC
Start: 2014-12-05 — End: 2014-12-06
  Administered 2014-12-05: 1000 mL via INTRAVENOUS

## 2014-12-05 MED ORDER — OXYCODONE-ACETAMINOPHEN 5-325 MG PO TABS: 1.0000 | ORAL_TABLET | Freq: Four times a day (QID) | ORAL | Status: DC | PRN

## 2014-12-05 NOTE — ED Notes (Signed)
Pt would not stand for orthostatic vitals, stated she could not stand due to her feet being "numb" and pain all over.

## 2014-12-05 NOTE — ED Notes (Signed)
Pt fiance called to express pts concerns about medication management and care. Nurse explained patient rights and how medications are administered to avoid mistakes. Also explained to pt that if she had concerns it is encouraged for her to inform nurse or any available staff. Stated she understood.

## 2014-12-05 NOTE — ED Provider Notes (Signed)
CSN: [id number]     Arrival date & time 12/05/14  1745 History   First MD Initiated Contact with Patient 12/05/14 1849     Chief Complaint  Patient presents with  . Emesis     (Consider location/radiation/quality/duration/timing/severity/associated sxs/prior Treatment) Patient is a 29 y.o. female presenting with vomiting. The history is provided by the patient (pt complains of vomiting and aching all over.  she has history of chronic pain).  Emesis Severity:  Moderate Timing:  Intermittent Quality:  Bilious material Able to tolerate:  Liquids Progression:  Unchanged Chronicity:  Recurrent Recent urination:  Normal Context: not self-induced   Relieved by:  Nothing Associated symptoms: no abdominal pain, no diarrhea and no headaches     Past Medical History  Diagnosis Date  . Hypertension   . Asthma   . Pelvic inflammatory disease (PID)   . Sickle cell disease (HCC)   . Fibromyalgia   . Chronic abdominal pain   . Migraine    Past Surgical History  Procedure Laterality Date  . Tubal ligation    . Kidney infections    . Wisdom tooth extraction     Family History  Problem Relation Age of Onset  . Hypertension Mother   . Diabetes Mother   . Heart failure Father    Social History  Substance Use Topics  . Smoking status: Light Tobacco Smoker    Types: Cigarettes  . Smokeless tobacco: Never Used     Comment: Occasional smoker  . Alcohol Use: Yes     Comment: occasionally-once a month   OB History    Gravida Para Term Preterm AB TAB SAB Ectopic Multiple Living   4 4 1 3      4      Review of Systems  Constitutional: Negative for appetite change and fatigue.  HENT: Negative for congestion, ear discharge and sinus pressure.   Eyes: Negative for discharge.  Respiratory: Negative for cough.   Cardiovascular: Negative for chest pain.  Gastrointestinal: Positive for vomiting. Negative for abdominal pain and diarrhea.  Genitourinary: Negative for frequency and  hematuria.  Musculoskeletal: Negative for back pain.  Skin: Negative for rash.  Neurological: Negative for seizures and headaches.  Psychiatric/Behavioral: Negative for hallucinations.      Allergies  Tomato; Aspirin; Doxycycline; Hydromorphone hcl; Ibuprofen; Metoclopramide; Other; Shellfish allergy; Toradol; and Latex  Home Medications   Prior to Admission medications   Medication Sig Start Date End Date Taking? Authorizing Provider  acetaminophen-codeine (TYLENOL #3) 300-30 MG tablet Take 1 tablet by mouth every 12 (twelve) hours as needed for moderate pain. 11/18/14  Yes Jaclyn Shaggy, MD  albuterol (PROVENTIL HFA;VENTOLIN HFA) 108 (90 BASE) MCG/ACT inhaler Inhale 2 puffs into the lungs every 6 (six) hours as needed for wheezing or shortness of breath. 01/14/12  Yes Consuello Masse, MD  amitriptyline (ELAVIL) 50 MG tablet Take 1 tablet (50 mg total) by mouth at bedtime. 11/17/14  Yes Suanne Marker, MD  butalbital-acetaminophen-caffeine (FIORICET, ESGIC) 50-325-40 MG tablet Take 2 tablets by mouth every 6 (six) hours as needed for headache. 10/17/14  Yes Jerald Kief, MD  cyanocobalamin 1000 MCG tablet Take 1 tablet (1,000 mcg total) by mouth daily. 11/18/14  Yes Jaclyn Shaggy, MD  feeding supplement, ENSURE ENLIVE, (ENSURE ENLIVE) LIQD Take 237 mLs by mouth 3 (three) times daily between meals. 10/17/14  Yes Jerald Kief, MD  FLUoxetine (PROZAC) 20 MG capsule Take 1 capsule (20 mg total) by mouth daily. 11/19/14  Yes Enobong Amao,  MD  folic acid (FOLVITE) 1 MG tablet Take 1 tablet (1 mg total) by mouth daily. 10/17/14  Yes Jerald Kief, MD  gabapentin (NEURONTIN) 300 MG capsule Take 1 capsule (300 mg total) by mouth 3 (three) times daily. 11/18/14  Yes Jaclyn Shaggy, MD  magnesium oxide (MAG-OX) 400 (241.3 MG) MG tablet Take 1 tablet (400 mg total) by mouth 2 (two) times daily. 11/18/14  Yes Jaclyn Shaggy, MD  nicotine (NICODERM CQ - DOSED IN MG/24 HOURS) 21 mg/24hr patch Place 1 patch (21  mg total) onto the skin daily. 10/17/14  Yes Jerald Kief, MD  nystatin (MYCOSTATIN) 100000 UNIT/ML suspension Take 5 mLs (500,000 Units total) by mouth 4 (four) times daily. 11/18/14  Yes Jaclyn Shaggy, MD  oxyCODONE (OXY IR/ROXICODONE) 5 MG immediate release tablet Take 1 tablet (5 mg total) by mouth every 4 (four) hours as needed for severe pain or breakthrough pain. 10/17/14  Yes Jerald Kief, MD  pantoprazole (PROTONIX) 40 MG tablet Take 1 tablet (40 mg total) by mouth daily. 11/18/14  Yes Jaclyn Shaggy, MD  ALPRAZolam Prudy Feeler) 0.5 MG tablet Take 1-2 tablets prior to test. You can take additional tablet at time of test if needed. Must have someone drive you to and from. Medication can cause drowsiness. Patient not taking: Reported on 12/05/2014 12/02/14   Suanne Marker, MD  cyclobenzaprine (FLEXERIL) 10 MG tablet Take 1 tablet (10 mg total) by mouth 3 (three) times daily as needed for muscle spasms. 11/18/14   Jaclyn Shaggy, MD  diphenhydrAMINE (BENADRYL) 25 mg capsule Take 1 capsule (25 mg total) by mouth every 6 (six) hours as needed for itching. Also available OTC 10/17/14   Jerald Kief, MD  oxyCODONE-acetaminophen (PERCOCET/ROXICET) 5-325 MG tablet Take 1 tablet by mouth every 6 (six) hours as needed. 12/05/14   Bethann Berkshire, MD  oxyCODONE-acetaminophen (PERCOCET/ROXICET) 5-325 MG tablet Take 1 tablet by mouth every 6 (six) hours as needed. 12/05/14   Bethann Berkshire, MD  polyethylene glycol [facility]) packet Take 17 g by mouth daily. 10/17/14   Jerald Kief, MD  promethazine (PHENERGAN) 25 MG tablet Take 1 tablet (25 mg total) by mouth every 6 (six) hours as needed for nausea or vomiting. 12/05/14   Bethann Berkshire, MD  UNABLE TO FIND Outpatient physical therapy  Diagnosis: lower extremity weakness, B12 deficiency 10/16/14   Ripudeep K Rai, MD   BP 137/118 mmHg  Pulse 110  Temp(Src) 98.2 F (36.8 C) (Oral)  Resp 20  Ht 5' (1.524 m)  Wt 150 lb (68.04 kg)  BMI 29.30 kg/m2  SpO2  98%  LMP 12/02/2014 Physical Exam  Constitutional: She is oriented to person, place, and time. She appears well-developed.  HENT:  Head: Normocephalic.  Eyes: Conjunctivae and EOM are normal. No scleral icterus.  Neck: Neck supple. No thyromegaly present.  Cardiovascular: Normal rate and regular rhythm.  Exam reveals no gallop and no friction rub.   No murmur heard. Pulmonary/Chest: No stridor. She has no wheezes. She has no rales. She exhibits no tenderness.  Abdominal: She exhibits no distension. There is no tenderness. There is no rebound.  Musculoskeletal: Normal range of motion. She exhibits edema.  Edema in both legs,  chronic  Lymphadenopathy:    She has no cervical adenopathy.  Neurological: She is oriented to person, place, and time. She exhibits normal muscle tone. Coordination normal.  Skin: No rash noted. No erythema.  Psychiatric: She has a normal mood and affect. Her behavior is  normal.    ED Course  Procedures (including critical care time) Labs Review Labs Reviewed  CBC WITH DIFFERENTIAL/PLATELET - Abnormal; Notable for the following:    Hemoglobin 11.2 (*)    MCHC 29.4 (*)    All other components within normal limits  COMPREHENSIVE METABOLIC PANEL - Abnormal; Notable for the following:    Glucose, Bld 107 (*)    All other components within normal limits  URINALYSIS, ROUTINE W REFLEX MICROSCOPIC (NOT AT [facility])    Imaging Review Dg Abd Acute W/chest  12/05/2014  CLINICAL DATA:  Nausea and vomiting with weakness. EXAM: DG ABDOMEN ACUTE W/ 1V CHEST COMPARISON:  CT 10/10/2014 FINDINGS: The cardiomediastinal contours are normal. The lungs are clear. There is no free intra-abdominal air. No dilated bowel loops to suggest obstruction. The stomach distended with ingested contents. Moderate to large volume of stool throughout the colon. No radiopaque calculi. No acute osseous abnormalities are seen. IMPRESSION: 1. Moderate to large volume of stool suggesting constipation.  No bowel obstruction. 2. Clear lungs. Electronically Signed   By: Rubye Oaks M.D.   On: 12/05/2014 21:30   I have personally reviewed and evaluated these images and lab results as part of my medical decision-making.   EKG Interpretation None      MDM   Final diagnoses:  Dehydration  Chronic pain   Labs and x-ray unremarkable Patient has a history of chronic pain she is out of her Percocets. Patient is also out of Phenergan. She was dehydrated in the emergency department. Patient was given 2 L of fluids along with some pain medicine. Patient was discharged with Percocets and Phenergan she is to follow-up with her family doctor    Bethann Berkshire, MD 12/06/14 1524

## 2014-12-05 NOTE — Discharge Instructions (Signed)
Follow up with your md next week. °

## 2014-12-05 NOTE — ED Notes (Signed)
Pt reports vomiting, aching all over, and swelling.

## 2014-12-06 NOTE — ED Notes (Signed)
Discharge instructions given, pt demonstrated teach back and verbal understanding. Hostile with reception, but voice no concerns

## 2014-12-08 DIAGNOSIS — Z0289 Encounter for other administrative examinations: Secondary | ICD-10-CM

## 2014-12-14 ENCOUNTER — Ambulatory Visit
Admission: RE | Admit: 2014-12-14 | Discharge: 2014-12-14 | Disposition: A | Discharge status: Home / Self Care | Payer: Medicaid Other | Source: Ambulatory Visit | Attending: Diagnostic Neuroimaging | Admitting: Diagnostic Neuroimaging

## 2014-12-14 ENCOUNTER — Other Ambulatory Visit: Payer: Medicaid Other

## 2014-12-14 ENCOUNTER — Inpatient Hospital Stay: Admission: RE | Admit: 2014-12-14 | Payer: Medicaid Other | Source: Ambulatory Visit

## 2014-12-14 DIAGNOSIS — R200 Anesthesia of skin: Secondary | ICD-10-CM | POA: Diagnosis not present

## 2014-12-14 DIAGNOSIS — G894 Chronic pain syndrome: Secondary | ICD-10-CM

## 2014-12-14 DIAGNOSIS — R51 Headache: Principal | ICD-10-CM

## 2014-12-14 DIAGNOSIS — E538 Deficiency of other specified B group vitamins: Secondary | ICD-10-CM

## 2014-12-14 DIAGNOSIS — R519 Headache, unspecified: Secondary | ICD-10-CM

## 2014-12-15 NOTE — Telephone Encounter (Signed)
Patient medication was switched to Prozac 20mg  capsules. CMA called pharmacy to verify change and spoke April at [facility]. April verified that it was switched from tabs to caps.

## 2014-12-17 ENCOUNTER — Telehealth: Payer: Self-pay | Admitting: Diagnostic Neuroimaging

## 2014-12-17 ENCOUNTER — Telehealth: Payer: Self-pay | Admitting: Family Medicine

## 2014-12-17 NOTE — Telephone Encounter (Signed)
Patient called to advise she just got back from out of town, she thought she grabbed her promethazine (PHENERGAN) 25 MG tablet out of the fridge but she forgot it. Would like a prescription for promethazine (PHENERGAN) 25 MG tablet sent to pharmacy, is having nausea and vomiting, states she can't take the tablet, it makes her gag, the liquid works better. Pharmacy is [facility] in [place].

## 2014-12-17 NOTE — Telephone Encounter (Signed)
Attempted to reach patient; no answer, no means of leaving VM.

## 2014-12-17 NOTE — Telephone Encounter (Signed)
Patient Called stating that she lost her medicine and needs a medication refill for Liquid promethazine and tylenol 3 Please follow up with patient

## 2014-12-18 ENCOUNTER — Emergency Department (HOSPITAL_COMMUNITY): Payer: Medicaid Other

## 2014-12-18 ENCOUNTER — Emergency Department (HOSPITAL_COMMUNITY)
Admission: EM | Admit: 2014-12-18 | Discharge: 2014-12-18 | Disposition: A | Discharge status: Home / Self Care | Payer: Medicaid Other | Attending: Emergency Medicine | Admitting: Emergency Medicine

## 2014-12-18 ENCOUNTER — Encounter (HOSPITAL_COMMUNITY): Payer: Self-pay | Admitting: Cardiology

## 2014-12-18 DIAGNOSIS — Z79899 Other long term (current) drug therapy: Secondary | ICD-10-CM | POA: Diagnosis not present

## 2014-12-18 DIAGNOSIS — R51 Headache: Secondary | ICD-10-CM | POA: Insufficient documentation

## 2014-12-18 DIAGNOSIS — Z8742 Personal history of other diseases of the female genital tract: Secondary | ICD-10-CM | POA: Insufficient documentation

## 2014-12-18 DIAGNOSIS — R519 Headache, unspecified: Secondary | ICD-10-CM

## 2014-12-18 DIAGNOSIS — G8929 Other chronic pain: Secondary | ICD-10-CM | POA: Insufficient documentation

## 2014-12-18 DIAGNOSIS — R200 Anesthesia of skin: Secondary | ICD-10-CM | POA: Insufficient documentation

## 2014-12-18 DIAGNOSIS — Z8739 Personal history of other diseases of the musculoskeletal system and connective tissue: Secondary | ICD-10-CM | POA: Diagnosis not present

## 2014-12-18 DIAGNOSIS — H53149 Visual discomfort, unspecified: Secondary | ICD-10-CM | POA: Diagnosis not present

## 2014-12-18 DIAGNOSIS — Z91040 Latex allergy status: Secondary | ICD-10-CM | POA: Diagnosis not present

## 2014-12-18 DIAGNOSIS — Z862 Personal history of diseases of the blood and blood-forming organs and certain disorders involving the immune mechanism: Secondary | ICD-10-CM | POA: Insufficient documentation

## 2014-12-18 DIAGNOSIS — R112 Nausea with vomiting, unspecified: Secondary | ICD-10-CM | POA: Insufficient documentation

## 2014-12-18 DIAGNOSIS — R202 Paresthesia of skin: Secondary | ICD-10-CM | POA: Diagnosis not present

## 2014-12-18 DIAGNOSIS — J45909 Unspecified asthma, uncomplicated: Secondary | ICD-10-CM | POA: Diagnosis not present

## 2014-12-18 DIAGNOSIS — M6281 Muscle weakness (generalized): Secondary | ICD-10-CM | POA: Insufficient documentation

## 2014-12-18 DIAGNOSIS — I10 Essential (primary) hypertension: Secondary | ICD-10-CM | POA: Insufficient documentation

## 2014-12-18 DIAGNOSIS — F17210 Nicotine dependence, cigarettes, uncomplicated: Secondary | ICD-10-CM | POA: Insufficient documentation

## 2014-12-18 LAB — COMPREHENSIVE METABOLIC PANEL
ALK PHOS: 53 U/L (ref 38–126)
ALT: 29 U/L (ref 14–54)
ANION GAP: 10 (ref 5–15)
AST: 29 U/L (ref 15–41)
Albumin: 4.3 g/dL (ref 3.5–5.0)
BUN: 7 mg/dL (ref 6–20)
CALCIUM: 9 mg/dL (ref 8.9–10.3)
CHLORIDE: 105 mmol/L (ref 101–111)
CO2: 23 mmol/L (ref 22–32)
CREATININE: 0.68 mg/dL (ref 0.44–1.00)
Glucose, Bld: 80 mg/dL (ref 65–99)
Potassium: 3.7 mmol/L (ref 3.5–5.1)
Sodium: 138 mmol/L (ref 135–145)
Total Bilirubin: 0.4 mg/dL (ref 0.3–1.2)
Total Protein: 8.1 g/dL (ref 6.5–8.1)

## 2014-12-18 LAB — LIPASE, BLOOD: LIPASE: 28 U/L (ref 11–51)

## 2014-12-18 LAB — CSF CELL COUNT WITH DIFFERENTIAL
OTHER CELLS CSF: NONE SEEN
OTHER CELLS CSF: NONE SEEN
RBC COUNT CSF: 1 /mm3 — AB
RBC Count, CSF: 8 /mm3 — ABNORMAL HIGH
TUBE #: 1
Tube #: 4
WBC, CSF: 1 /mm3 (ref 0–5)
WBC, CSF: 2 /mm3 (ref 0–5)

## 2014-12-18 LAB — CBC
HCT: 37.6 % (ref 36.0–46.0)
Hemoglobin: 13.5 g/dL (ref 12.0–15.0)
MCH: 29.3 pg (ref 26.0–34.0)
MCHC: 35.9 g/dL (ref 30.0–36.0)
MCV: 81.7 fL (ref 78.0–100.0)
PLATELETS: 193 10*3/uL (ref 150–400)
RBC: 4.6 MIL/uL (ref 3.87–5.11)
RDW: 14.1 % (ref 11.5–15.5)
WBC: 10.4 10*3/uL (ref 4.0–10.5)

## 2014-12-18 LAB — PROTEIN AND GLUCOSE, CSF
Glucose, CSF: 56 mg/dL (ref 40–70)
TOTAL PROTEIN, CSF: 16 mg/dL (ref 15–45)

## 2014-12-18 LAB — URINALYSIS, ROUTINE W REFLEX MICROSCOPIC
Bilirubin Urine: NEGATIVE
Glucose, UA: NEGATIVE mg/dL
Hgb urine dipstick: NEGATIVE
Ketones, ur: NEGATIVE mg/dL
LEUKOCYTES UA: NEGATIVE
Nitrite: NEGATIVE
PROTEIN: NEGATIVE mg/dL
Specific Gravity, Urine: 1.015 (ref 1.005–1.030)
pH: 6.5 (ref 5.0–8.0)

## 2014-12-18 LAB — PREGNANCY, URINE: PREG TEST UR: NEGATIVE

## 2014-12-18 MED ORDER — HYDROMORPHONE HCL 1 MG/ML IJ SOLN
1.0000 mg | Freq: Once | INTRAMUSCULAR | Status: AC
  Administered 2014-12-18: 1 mg via INTRAVENOUS
  Filled 2014-12-18: qty 1

## 2014-12-18 MED ORDER — SODIUM CHLORIDE 0.9 % IV BOLUS (SEPSIS)
1000.0000 mL | Freq: Once | INTRAVENOUS | Status: AC
  Administered 2014-12-18: 1000 mL via INTRAVENOUS

## 2014-12-18 MED ORDER — ONDANSETRON HCL 4 MG/2ML IJ SOLN
4.0000 mg | Freq: Once | INTRAMUSCULAR | Status: AC
  Administered 2014-12-18: 4 mg via INTRAVENOUS
  Filled 2014-12-18: qty 2

## 2014-12-18 MED ORDER — DIPHENHYDRAMINE HCL 50 MG/ML IJ SOLN
25.0000 mg | Freq: Once | INTRAMUSCULAR | Status: AC
  Administered 2014-12-18: 25 mg via INTRAVENOUS
  Filled 2014-12-18: qty 1

## 2014-12-18 MED ORDER — MORPHINE SULFATE (PF) 4 MG/ML IV SOLN
4.0000 mg | Freq: Once | INTRAVENOUS | Status: AC
  Administered 2014-12-18: 4 mg via INTRAVENOUS
  Filled 2014-12-18: qty 1

## 2014-12-18 MED ORDER — LORAZEPAM 2 MG/ML IJ SOLN
1.0000 mg | Freq: Once | INTRAMUSCULAR | Status: AC
  Administered 2014-12-18: 1 mg via INTRAVENOUS
  Filled 2014-12-18: qty 1

## 2014-12-18 MED ORDER — PROMETHAZINE HCL 25 MG/ML IJ SOLN
25.0000 mg | Freq: Once | INTRAMUSCULAR | Status: AC
  Administered 2014-12-18: 25 mg via INTRAVENOUS
  Filled 2014-12-18: qty 1

## 2014-12-18 MED ORDER — PROMETHAZINE HCL 25 MG PO TABS: 25.0000 mg | ORAL_TABLET | Freq: Four times a day (QID) | ORAL | Status: DC | PRN

## 2014-12-18 MED ORDER — PROMETHAZINE HCL 25 MG/ML IJ SOLN
25.0000 mg | Freq: Four times a day (QID) | INTRAMUSCULAR | Status: DC | PRN
  Administered 2014-12-18: 25 mg via INTRAVENOUS
  Filled 2014-12-18: qty 1

## 2014-12-18 NOTE — Discharge Instructions (Signed)

## 2014-12-18 NOTE — Telephone Encounter (Signed)
Per Inetta Fermo, infusion RN this office cannot give migraine cocktail due to patient's insurance. Dr Marjory Lies then recommends patient go to nearest Urgent Care for infusion. Spoke with patient and advised her. She lives out of town, so suggested this may be more convenient for her as well. She states her parent will drive her, understanding that the Phenergan she is taking causes sedation. She verbalized understanding, appreciation for call back.

## 2014-12-18 NOTE — ED Notes (Signed)
Cluster headache times 5 days.  Vomiting times 3 days.  States her fibromyalgia is flaring up too

## 2014-12-18 NOTE — Telephone Encounter (Signed)
Offer IV Migraine cocktail here in clinic. -VRP

## 2014-12-18 NOTE — Telephone Encounter (Signed)
Spoke to patient re: Phenergan script. She states she received a 50 mL bottle yesterday from another dr. She accidentally left her previous bottle at another home when she was away from home last week. She states she is not getting relief of migraine or nausea. She states she is taking Amitriptyline 50 mg at bedtime, taking Fioricet and Tylenol 500 mg tabs prn with little relief. She states "I am trying to stay asleep all day with the lights off. I have to keep my eyes closed."  She is requesting another medication to relieve her migraine and nausea. Informed her would route her request to Dr Marjory Lies and call her back today.  She verbalized understanding, appreciation.

## 2014-12-18 NOTE — ED Provider Notes (Signed)
Results for orders placed or performed during the hospital encounter of 12/18/14  CSF culture  Result Value Ref Range   Specimen Description CSF    Special Requests Normal    Gram Stain NO ORGANISMS SEEN PERFORMED AT [facility]     Culture PENDING    Report Status PENDING   Lipase, blood  Result Value Ref Range   Lipase 28 11 - 51 U/L  Comprehensive metabolic panel  Result Value Ref Range   Sodium 138 135 - 145 mmol/L   Potassium 3.7 3.5 - 5.1 mmol/L   Chloride 105 101 - 111 mmol/L   CO2 23 22 - 32 mmol/L   Glucose, Bld 80 65 - 99 mg/dL   BUN 7 6 - 20 mg/dL   Creatinine, Ser 3.79 0.44 - 1.00 mg/dL   Calcium 9.0 8.9 - 02.4 mg/dL   Total Protein 8.1 6.5 - 8.1 g/dL   Albumin 4.3 3.5 - 5.0 g/dL   AST 29 15 - 41 U/L   ALT 29 14 - 54 U/L   Alkaline Phosphatase 53 38 - 126 U/L   Total Bilirubin 0.4 0.3 - 1.2 mg/dL   GFR calc non Af Amer >60 >60 mL/min   GFR calc Af Amer >60 >60 mL/min   Anion gap 10 5 - 15  CBC  Result Value Ref Range   WBC 10.4 4.0 - 10.5 K/uL   RBC 4.60 3.87 - 5.11 MIL/uL   Hemoglobin 13.5 12.0 - 15.0 g/dL   HCT 09.7 35.3 - 29.9 %   MCV 81.7 78.0 - 100.0 fL   MCH 29.3 26.0 - 34.0 pg   MCHC 35.9 30.0 - 36.0 g/dL   RDW 24.2 68.3 - 41.9 %   Platelets 193 150 - 400 K/uL  Urinalysis, Routine w reflex microscopic (not at [facility])  Result Value Ref Range   Color, Urine YELLOW YELLOW   APPearance CLEAR CLEAR   Specific Gravity, Urine 1.015 1.005 - 1.030   pH 6.5 5.0 - 8.0   Glucose, UA NEGATIVE NEGATIVE mg/dL   Hgb urine dipstick NEGATIVE NEGATIVE   Bilirubin Urine NEGATIVE NEGATIVE   Ketones, ur NEGATIVE NEGATIVE mg/dL   Protein, ur NEGATIVE NEGATIVE mg/dL   Nitrite NEGATIVE NEGATIVE   Leukocytes, UA NEGATIVE NEGATIVE  Pregnancy, urine  Result Value Ref Range   Preg Test, Ur NEGATIVE NEGATIVE  CSF cell count with differential collection tube #: 1  Result Value Ref Range   Tube # 1    Color, CSF COLORLESS COLORLESS   Appearance, CSF CLEAR (A) CLEAR   Supernatant NOT INDICATED    RBC Count, CSF 8 (H) 0 /cu mm   WBC, CSF 1 0 - 5 /cu mm   Segmented Neutrophils-CSF TOO FEW TO COUNT, SMEAR AVAILABLE FOR REVIEW 0 - 6 %   Lymphs, CSF TOO FEW TO COUNT, SMEAR AVAILABLE FOR REVIEW 40 - 80 %   Monocyte-Macrophage-Spinal Fluid TOO FEW TO COUNT, SMEAR AVAILABLE FOR REVIEW 15 - 45 %   Eosinophils, CSF TOO FEW TO COUNT, SMEAR AVAILABLE FOR REVIEW 0 - 1 %   Other Cells, CSF NO WHITE CELLS SEEN   CSF cell count with differential collection tube #: 4  Result Value Ref Range   Tube # 4    Color, CSF COLORLESS COLORLESS   Appearance, CSF CLEAR (A) CLEAR   Supernatant NOT INDICATED    RBC Count, CSF 1 (H) 0 /cu mm   WBC, CSF 2 0 - 5 /cu mm  Segmented Neutrophils-CSF TOO FEW TO COUNT, SMEAR AVAILABLE FOR REVIEW 0 - 6 %   Lymphs, CSF TOO FEW TO COUNT, SMEAR AVAILABLE FOR REVIEW 40 - 80 %   Monocyte-Macrophage-Spinal Fluid TOO FEW TO COUNT, SMEAR AVAILABLE FOR REVIEW 15 - 45 %   Eosinophils, CSF TOO FEW TO COUNT, SMEAR AVAILABLE FOR REVIEW 0 - 1 %   Other Cells, CSF NO WHITE CELLS SEEN   Protein and glucose, CSF  Result Value Ref Range   Glucose, CSF 56 40 - 70 mg/dL   Total  Protein, CSF 16 15 - 45 mg/dL   Lumbar puncture results without any evidence of infection no significant white blood cells no significant red blood cells. Gram stains also negative. No significant elevation in glucose or protein. Patient stable for discharge home we'll treat with Phenergan.  Vanetta Mulders, MD 12/18/14 [phone]

## 2014-12-18 NOTE — ED Notes (Signed)
Pt requesting additional pain medication. States her back pain is excruciating. Pt in no obvious distress.

## 2014-12-18 NOTE — ED Notes (Signed)
MD at bedside. 

## 2014-12-18 NOTE — ED Notes (Signed)
Patient given discharge instruction, verbalized understand. IV removed, band aid applied. Patient wheelchair out of the department.  

## 2014-12-18 NOTE — ED Provider Notes (Signed)
CSN: [id number]     Arrival date & time 12/18/14  1503 History   First MD Initiated Contact with Patient 12/18/14 1631     Chief Complaint  Patient presents with  . Headache  . Emesis     (Consider location/radiation/quality/duration/timing/severity/associated sxs/prior Treatment) HPI Comments: Patient reports 4 day history of "cluster migraine headaches" that have been progressively worsening. Denies thunderclap onset. Associated with nausea and vomiting and photophobia. Reports fever to 102 yesterday. Headache is similar to her typical migraine headaches and not improved with Fioricet. She endorses worsening of her chronic fibromyalgia pain as well from her neck all the way down to her toes. She endorses generalized weakness in her arms and legs with numbness and tingling which she states is from her fibromyalgia. Denies diarrhea. Denies abdominal pain. Denies any chest pain or shortness of breath. Questionable history of sickle cell disease. Denies any sick contacts.  Patient is a 29 y.o. female presenting with vomiting. The history is provided by the patient.  Emesis Associated symptoms: headaches   Associated symptoms: no abdominal pain, no arthralgias and no myalgias     Past Medical History  Diagnosis Date  . Hypertension   . Asthma   . Pelvic inflammatory disease (PID)   . Sickle cell disease (HCC)   . Fibromyalgia   . Chronic abdominal pain   . Migraine    Past Surgical History  Procedure Laterality Date  . Tubal ligation    . Kidney infections    . Wisdom tooth extraction     Family History  Problem Relation Age of Onset  . Hypertension Mother   . Diabetes Mother   . Heart failure Father    Social History  Substance Use Topics  . Smoking status: Light Tobacco Smoker    Types: Cigarettes  . Smokeless tobacco: Never Used     Comment: Occasional smoker  . Alcohol Use: Yes     Comment: occasionally-once a month   OB History    Gravida Para Term Preterm AB TAB  SAB Ectopic Multiple Living   4 4 1 3      4      Review of Systems  Constitutional: Positive for activity change and appetite change. Negative for fever.  HENT: Negative for congestion and rhinorrhea.   Eyes: Negative for photophobia.  Respiratory: Negative for cough, chest tightness and shortness of breath.   Cardiovascular: Negative for chest pain.  Gastrointestinal: Positive for nausea and vomiting. Negative for abdominal pain.  Genitourinary: Negative for dysuria, hematuria, vaginal bleeding and vaginal discharge.  Musculoskeletal: Negative for myalgias and arthralgias.  Skin: Negative for rash.  Neurological: Positive for headaches. Negative for weakness.  A complete 10 system review of systems was obtained and all systems are negative except as noted in the HPI and PMH.      Allergies  Tomato; Aspirin; Doxycycline; Hydromorphone hcl; Ibuprofen; Metoclopramide; Other; Shellfish allergy; Toradol; and Latex  Home Medications   Prior to Admission medications   Medication Sig Start Date End Date Taking? Authorizing Provider  acetaminophen-codeine (TYLENOL #3) 300-30 MG tablet Take 1 tablet by mouth every 12 (twelve) hours as needed for moderate pain. 11/18/14  Yes Jaclyn Shaggy, MD  albuterol (PROVENTIL HFA;VENTOLIN HFA) 108 (90 BASE) MCG/ACT inhaler Inhale 2 puffs into the lungs every 6 (six) hours as needed for wheezing or shortness of breath. 01/14/12  Yes Consuello Masse, MD  amitriptyline (ELAVIL) 50 MG tablet Take 1 tablet (50 mg total) by mouth at bedtime. 11/17/14  Yes Suanne Marker, MD  butalbital-acetaminophen-caffeine (FIORICET, ESGIC) [phone] MG tablet Take 2 tablets by mouth every 6 (six) hours as needed for headache. 10/17/14  Yes Jerald Kief, MD  cyanocobalamin 1000 MCG tablet Take 1 tablet (1,000 mcg total) by mouth daily. 11/18/14  Yes Jaclyn Shaggy, MD  cyclobenzaprine (FLEXERIL) 10 MG tablet Take 1 tablet (10 mg total) by mouth 3 (three) times daily as needed  for muscle spasms. 11/18/14  Yes Jaclyn Shaggy, MD  diphenhydrAMINE (BENADRYL) 25 mg capsule Take 1 capsule (25 mg total) by mouth every 6 (six) hours as needed for itching. Also available OTC Patient taking differently: Take 25 mg by mouth at bedtime. Also available OTC 10/17/14  Yes Jerald Kief, MD  feeding supplement, ENSURE ENLIVE, (ENSURE ENLIVE) LIQD Take 237 mLs by mouth 3 (three) times daily between meals. 10/17/14  Yes Jerald Kief, MD  FLUoxetine (PROZAC) 20 MG capsule Take 1 capsule (20 mg total) by mouth daily. 11/19/14  Yes Jaclyn Shaggy, MD  folic acid (FOLVITE) 1 MG tablet Take 1 tablet (1 mg total) by mouth daily. 10/17/14  Yes Jerald Kief, MD  gabapentin (NEURONTIN) 300 MG capsule Take 1 capsule (300 mg total) by mouth 3 (three) times daily. 11/18/14  Yes Jaclyn Shaggy, MD  magnesium oxide (MAG-OX) 400 (241.3 MG) MG tablet Take 1 tablet (400 mg total) by mouth 2 (two) times daily. 11/18/14  Yes Jaclyn Shaggy, MD  nicotine (NICODERM CQ - DOSED IN MG/24 HOURS) 21 mg/24hr patch Place 1 patch (21 mg total) onto the skin daily. 10/17/14  Yes Jerald Kief, MD  nystatin (MYCOSTATIN) 100000 UNIT/ML suspension Take 5 mLs (500,000 Units total) by mouth 4 (four) times daily. 11/18/14  Yes Jaclyn Shaggy, MD  oxyCODONE (OXY IR/ROXICODONE) 5 MG immediate release tablet Take 1 tablet (5 mg total) by mouth every 4 (four) hours as needed for severe pain or breakthrough pain. 10/17/14  Yes Jerald Kief, MD  oxyCODONE-acetaminophen (PERCOCET/ROXICET) 5-325 MG tablet Take 1 tablet by mouth every 6 (six) hours as needed. 12/05/14  Yes Bethann Berkshire, MD  pantoprazole (PROTONIX) 40 MG tablet Take 1 tablet (40 mg total) by mouth daily. 11/18/14  Yes Jaclyn Shaggy, MD  polyethylene glycol (MIRALAX) packet Take 17 g by mouth daily. 10/17/14  Yes Jerald Kief, MD  promethazine (PHENERGAN) 25 MG tablet Take 1 tablet (25 mg total) by mouth every 6 (six) hours as needed for nausea or vomiting. 12/05/14  Yes Bethann Berkshire, MD  promethazine (PHENERGAN) 25 MG tablet Take 1 tablet (25 mg total) by mouth every 6 (six) hours as needed. 12/18/14   Vanetta Mulders, MD  UNABLE TO FIND Outpatient physical therapy  Diagnosis: lower extremity weakness, B12 deficiency 10/16/14   Ripudeep K Rai, MD   BP 140/60 mmHg  Pulse 88  Temp(Src) 98.4 F (36.9 C) (Oral)  Resp 13  Ht 5' (1.524 m)  Wt 150 lb (68.04 kg)  BMI 29.30 kg/m2  SpO2 100%  LMP 12/02/2014 Physical Exam  Constitutional: She is oriented to person, place, and time. She appears well-developed and well-nourished. No distress.  HENT:  Head: Normocephalic and atraumatic.  Mouth/Throat: Oropharynx is clear and moist. No oropharyngeal exudate.  Eyes: Conjunctivae and EOM are normal. Pupils are equal, round, and reactive to light.  photophobic  Neck: Normal range of motion. Neck supple.  No meningismus.  Cardiovascular: Normal rate, regular rhythm, normal heart sounds and intact distal pulses.   No murmur heard. Pulmonary/Chest: Effort  normal and breath sounds normal. No respiratory distress.  Abdominal: Soft. There is no tenderness. There is no rebound and no guarding.  Musculoskeletal: Normal range of motion. She exhibits no edema or tenderness.  Neurological: She is alert and oriented to person, place, and time. No cranial nerve deficit. She exhibits normal muscle tone. Coordination normal.  Generally poor effort on neuro exam with 4/5 strength throughout, CN 2-12 intact Able to raise legs of bed bilaterally No pronator drift No ataxia on finger to nose +2 patellar reflexes bilaterally  Using a rolling walker which she states is normal "when my fibromyalgia gets bad"  Skin: Skin is warm.  Psychiatric: She has a normal mood and affect. Her behavior is normal.  Nursing note and vitals reviewed.   ED Course  .Lumbar Puncture Date/Time: 12/18/2014 9:55 PM Performed by: Glynn Octave Authorized by: Glynn Octave Consent: Verbal consent  obtained. Written consent obtained. Risks and benefits: risks, benefits and alternatives were discussed Consent given by: patient Patient understanding: patient states understanding of the procedure being performed Patient consent: the patient's understanding of the procedure matches consent given Procedure consent: procedure consent matches procedure scheduled Relevant documents: relevant documents present and verified Test results: test results available and properly labeled Site marked: the operative site was marked Imaging studies: imaging studies available Patient identity confirmed: provided demographic data and verbally with patient Time out: Immediately prior to procedure a "time out" was called to verify the correct patient, procedure, equipment, support staff and site/side marked as required. Indications: evaluation for infection Anesthesia: local infiltration Local anesthetic: lidocaine 1% without epinephrine Anesthetic total: 5 ml Patient sedated: no Preparation: Patient was prepped and draped in the usual sterile fashion. Lumbar space: L4-L5 interspace Patient's position: right lateral decubitus Needle gauge: 20 Needle type: spinal needle - Quincke tip Needle length: 2.5 in Number of attempts: 2 Fluid appearance: clear Tubes of fluid: 4 Total volume: 4 ml Post-procedure: site cleaned and adhesive bandage applied Patient tolerance: Patient tolerated the procedure well with no immediate complications   (including critical care time) Labs Review Labs Reviewed  CSF CELL COUNT WITH DIFFERENTIAL - Abnormal; Notable for the following:    Appearance, CSF CLEAR (*)    RBC Count, CSF 8 (*)    All other components within normal limits  CSF CELL COUNT WITH DIFFERENTIAL - Abnormal; Notable for the following:    Appearance, CSF CLEAR (*)    RBC Count, CSF 1 (*)    All other components within normal limits  CSF CULTURE  LIPASE, BLOOD  COMPREHENSIVE METABOLIC PANEL  CBC   URINALYSIS, ROUTINE W REFLEX MICROSCOPIC (NOT AT [facility])  PREGNANCY, URINE  PROTEIN AND GLUCOSE, CSF    Imaging Review Ct Head Wo Contrast  12/18/2014  CLINICAL DATA:  Cluster headaches. EXAM: CT HEAD WITHOUT CONTRAST TECHNIQUE: Contiguous axial images were obtained from the base of the skull through the vertex without intravenous contrast. COMPARISON:  MR brain 12/14/2014 FINDINGS: There is no evidence of mass effect, midline shift or extra-axial fluid collections. There is no evidence of a space-occupying lesion or intracranial hemorrhage. There is no evidence of a cortical-based area of acute infarction. The ventricles and sulci are appropriate for the patient's age. The basal cisterns are patent. Visualized portions of the orbits are unremarkable. The visualized portions of the paranasal sinuses and mastoid air cells are unremarkable. The osseous structures are unremarkable. IMPRESSION: Normal CT of the brain without intravenous contrast. Electronically Signed   By: Elige Ko   On:  12/18/2014 19:19   I have personally reviewed and evaluated these images and lab results as part of my medical decision-making.   EKG Interpretation None      MDM   Final diagnoses:  Headache, unspecified headache type   4 days of headache with photophobia, nausea, vomiting, fever to 102.  States "pain all over" from fibromyalgia as well.  No thunderclap onset.  Globally weak without localizing deficits. States part of her weaknes is from B12 deficiency.  Had MRI of T and L spine in September that were normal.  IVF, symptom control.  MRI brain on 11/27 was reassuring. CT head negative. Afebrile in the ED. No leukocytosis.  No meningismus on exam.  With persistent headache, photophobia and reported fever with vomiting, will perform LP.  LP results pending at time of sign out to Dr. Deretha Emory.   Glynn Octave, MD 12/19/14 [id number]

## 2014-12-19 NOTE — Telephone Encounter (Signed)
Patient Called stating that she lost her medicine and needs a medication refill for Liquid promethazine and tylenol 3 Please follow up with patient.  Patient is upset because she has not herd anything back, patient hung up the phone before more options could be given to the patient.

## 2014-12-19 NOTE — Telephone Encounter (Signed)
Patient was called by RN and name and date of birth verified.  Dr. Venetia Night spoke to patient and informed her that she would be unable to prescribe additional controlled substances to the patient.  Dr. Venetia Night reminded patient that at her last office visit on 11/18/14 she informed her of the [place] substance prescribing website and that she would prescribe her Tylenol #3 until her pain management appointment on 12/24/14.  Patient was in agreement.  Dr. Venetia Night looked up narcotic prescribing history on the [facility] registry for this patient and she has been frequenting the emergency department and filled 2 Rx for oxycodone on 12/05/14 and 12/06/14.  Dr. Venetia Night explained because of this she would be unable to refill narcotics to her again.  Patient would need to attend her pain management appointment on 12/24/14.

## 2014-12-24 LAB — CSF CULTURE W GRAM STAIN
Culture: NO GROWTH
Special Requests: NORMAL

## 2014-12-24 LAB — CSF CULTURE

## 2014-12-29 ENCOUNTER — Other Ambulatory Visit: Payer: Self-pay | Admitting: Family Medicine

## 2014-12-29 NOTE — Telephone Encounter (Signed)
Pt called back stating she had not heard back from Dr Marjory Lies. I explained there had been some communication with the provider but a decision had not been made and the RN would give her a call.

## 2014-12-29 NOTE — Telephone Encounter (Addendum)
Spoke with patient and informed her that Dr Marjory Lies will not prescribe any medications at this time. Informed her that he will discuss with her in her FU on 01/05/15. Inquired if she is going to pain clinic. Patient raised her voice stating the pain clinic is "only treating me for my fibromyalgia. They tell me to get Dr Marjory Lies to treat my migraines then you tell me he won't prescribe medicine." She continued to speak, not allowing this caller to speak, with her voice raised. She stated "Then you have to see me tomorrow." Informed her Dr Marjory Lies is out of office tomorrow but will see if he has opening on Thurs or Fri. Patient ended the call.  Addendum: During conversation patient began using foul language. This caller informed patient that she would not continue to talk with her if patient used foul language. Patient became louder, continued to interrupt until she ended call abruptly.

## 2014-12-29 NOTE — Telephone Encounter (Signed)
Patient was referred to pain management and she went on the 7th of December and they increased her dosage of gabapentin and pt is stating that it is still not working to reduce her pain. Pt also mentioned that she is still having headaches and vomiting. She cant seem to hold her food down and is hoping she can be prescribed acetaminophen-codeine (TYLENOL #3) 300-30 MG tablet and promethazine (PHENERGAN) 25 MG tablet. Please follow up with pt. Thank you.

## 2014-12-29 NOTE — Telephone Encounter (Signed)
Will see her in follow up appt on 01/05/15. No meds for now. -VRP

## 2014-12-29 NOTE — Telephone Encounter (Signed)
Pt called sts the Urgent Care in [place] does not give infusion anymore. She doesn't want to go to hospital as they don't do anything for her. She is having nausea and vomiting, sensitive to light and sound and not sleeping x 2 weeks. She sts she knows it is a migraine cluster because when she gets those she can't lift her head and throws up all day.she cannot take reglan as she throws it up and the fioricet is not working. She is requesting liquid promethazine. She did go to pain management 12/24/14 but provider did not give her anything sts it had to come from neurologist. Please call and advise

## 2014-12-31 NOTE — Telephone Encounter (Signed)
Patient was called back and verified name and date of birth.  Patient was reminded that she was being seen by neuro for her migraines and the pain clinic for her fibromyalgia and that this MD would not be addressing the medications she is asking for refills on.  Patient was reminded the Dr. Venetia Night spoke to her on November 30th regarding her pain medication her not being able to refill narcotics to her due to her "MD shopping" When patient heard this she immediately hung up the phone.

## 2015-01-02 ENCOUNTER — Emergency Department (HOSPITAL_COMMUNITY)
Admission: EM | Admit: 2015-01-02 | Discharge: 2015-01-02 | Disposition: A | Discharge status: Home / Self Care | Payer: Medicaid Other | Attending: Emergency Medicine | Admitting: Emergency Medicine

## 2015-01-02 ENCOUNTER — Encounter (HOSPITAL_COMMUNITY): Payer: Self-pay | Admitting: Emergency Medicine

## 2015-01-02 DIAGNOSIS — G8929 Other chronic pain: Secondary | ICD-10-CM | POA: Insufficient documentation

## 2015-01-02 DIAGNOSIS — H53149 Visual discomfort, unspecified: Secondary | ICD-10-CM | POA: Insufficient documentation

## 2015-01-02 DIAGNOSIS — J45909 Unspecified asthma, uncomplicated: Secondary | ICD-10-CM | POA: Insufficient documentation

## 2015-01-02 DIAGNOSIS — I10 Essential (primary) hypertension: Secondary | ICD-10-CM | POA: Diagnosis not present

## 2015-01-02 DIAGNOSIS — M797 Fibromyalgia: Secondary | ICD-10-CM | POA: Diagnosis not present

## 2015-01-02 DIAGNOSIS — F17210 Nicotine dependence, cigarettes, uncomplicated: Secondary | ICD-10-CM | POA: Insufficient documentation

## 2015-01-02 DIAGNOSIS — Z91040 Latex allergy status: Secondary | ICD-10-CM | POA: Diagnosis not present

## 2015-01-02 DIAGNOSIS — G44011 Episodic cluster headache, intractable: Secondary | ICD-10-CM | POA: Diagnosis not present

## 2015-01-02 DIAGNOSIS — R112 Nausea with vomiting, unspecified: Secondary | ICD-10-CM | POA: Insufficient documentation

## 2015-01-02 DIAGNOSIS — Z8742 Personal history of other diseases of the female genital tract: Secondary | ICD-10-CM | POA: Diagnosis not present

## 2015-01-02 DIAGNOSIS — Z79899 Other long term (current) drug therapy: Secondary | ICD-10-CM | POA: Insufficient documentation

## 2015-01-02 DIAGNOSIS — R51 Headache: Secondary | ICD-10-CM | POA: Diagnosis present

## 2015-01-02 DIAGNOSIS — D571 Sickle-cell disease without crisis: Secondary | ICD-10-CM | POA: Diagnosis not present

## 2015-01-02 LAB — BASIC METABOLIC PANEL
Anion gap: 11 (ref 5–15)
BUN: 7 mg/dL (ref 6–20)
CALCIUM: 9.6 mg/dL (ref 8.9–10.3)
CO2: 25 mmol/L (ref 22–32)
CREATININE: 0.67 mg/dL (ref 0.44–1.00)
Chloride: 104 mmol/L (ref 101–111)
GFR calc non Af Amer: >60 mL/min (ref >60)
Glucose, Bld: 79 mg/dL (ref 65–99)
Potassium: 3.6 mmol/L (ref 3.5–5.1)
SODIUM: 140 mmol/L (ref 135–145)

## 2015-01-02 LAB — CBC WITH DIFFERENTIAL/PLATELET
BASOS PCT: 0 %
Basophils Absolute: 0 10*3/uL (ref 0.0–0.1)
EOS ABS: 0.3 10*3/uL (ref 0.0–0.7)
Eosinophils Relative: 3 %
HCT: 40.5 % (ref 36.0–46.0)
HEMOGLOBIN: 14.2 g/dL (ref 12.0–15.0)
Lymphocytes Relative: 39 %
Lymphs Abs: 3.7 10*3/uL (ref 0.7–4.0)
MCH: 28.9 pg (ref 26.0–34.0)
MCHC: 35.1 g/dL (ref 30.0–36.0)
MCV: 82.5 fL (ref 78.0–100.0)
MONOS PCT: 8 %
Monocytes Absolute: 0.7 10*3/uL (ref 0.1–1.0)
NEUTROS PCT: 50 %
Neutro Abs: 4.7 10*3/uL (ref 1.7–7.7)
PLATELETS: 232 10*3/uL (ref 150–400)
RBC: 4.91 MIL/uL (ref 3.87–5.11)
RDW: 13.5 % (ref 11.5–15.5)
WBC: 9.5 10*3/uL (ref 4.0–10.5)

## 2015-01-02 MED ORDER — SODIUM CHLORIDE 0.9 % IV SOLN: INTRAVENOUS | Status: DC

## 2015-01-02 MED ORDER — DEXAMETHASONE SODIUM PHOSPHATE 4 MG/ML IJ SOLN
10.0000 mg | Freq: Once | INTRAMUSCULAR | Status: AC
  Administered 2015-01-02: 10 mg via INTRAVENOUS
  Filled 2015-01-02: qty 3

## 2015-01-02 MED ORDER — DIPHENHYDRAMINE HCL 50 MG/ML IJ SOLN
25.0000 mg | Freq: Once | INTRAMUSCULAR | Status: AC
  Administered 2015-01-02: 25 mg via INTRAVENOUS
  Filled 2015-01-02: qty 1

## 2015-01-02 MED ORDER — PROMETHAZINE HCL 6.25 MG/5ML PO SYRP: 25.0000 mg | ORAL_SOLUTION | Freq: Four times a day (QID) | ORAL | Status: DC | PRN

## 2015-01-02 MED ORDER — HYDROMORPHONE HCL 1 MG/ML IJ SOLN
1.0000 mg | Freq: Once | INTRAMUSCULAR | Status: AC
  Administered 2015-01-02: 1 mg via INTRAVENOUS
  Filled 2015-01-02: qty 1

## 2015-01-02 MED ORDER — PROMETHAZINE HCL 25 MG/ML IJ SOLN
12.5000 mg | Freq: Once | INTRAMUSCULAR | Status: AC
  Administered 2015-01-02: 12.5 mg via INTRAVENOUS
  Filled 2015-01-02: qty 1

## 2015-01-02 MED ORDER — SODIUM CHLORIDE 0.9 % IV BOLUS (SEPSIS)
1000.0000 mL | Freq: Once | INTRAVENOUS | Status: AC
  Administered 2015-01-02: 1000 mL via INTRAVENOUS

## 2015-01-02 NOTE — ED Notes (Signed)
Patient complaining of migraine with vomiting x 5 days.

## 2015-01-02 NOTE — ED Notes (Signed)
Pt states she can't tell a difference with her headache.  Asking for more phenergan.

## 2015-01-02 NOTE — Discharge Instructions (Signed)
Cluster Headache Cluster headaches are recognized by their pattern of deep, intense head pain. They normally occur on one side of your head, but they may "switch sides" in subsequent episodes. Typically, cluster headaches:   Are severe in nature.   Occur repeatedly over weeks to months and are followed by periods of no headaches.   Can last from 15 minutes to 3 hours.   Occur at the same time each day, often at night.   Occur several times a day. CAUSES The exact cause of cluster headaches is not known. Alcohol use may be associated with cluster headaches. SIGNS AND SYMPTOMS   Severe pain that begins in or around your eye or temple.   One-sided head pain.   Feeling sick to your stomach (nauseous).   Sensitivity to light.   Runny nose.   Eye redness, tearing, and nasal stuffiness on the side of your head where you are experiencing pain.   Sweaty, pale skin of the face.   Droopy or swollen eyelid.   Restlessness. DIAGNOSIS  Cluster headaches are diagnosed based on symptoms and a physical exam. Your health care provider may order a CT scan or an MRI of your head or lab tests to see if your headaches are caused by other medical conditions.  TREATMENT   Medicines for pain relief and to prevent recurrent attacks. Some people may need a combination of medicines.  Oxygen for pain relief.   Biofeedback programs to help reduce headache pain.  It may be helpful to keep a headache diary. This may help you find a trend for what is triggering your headaches. Your health care provider can develop a treatment plan.  HOME CARE INSTRUCTIONS  During cluster periods:   Follow a regular sleep schedule. Do not vary the amount and time that you sleep from day to day. It is important to stay on the same schedule during a cluster period to help prevent headaches.   Avoid alcohol.   Stop smoking if you smoke.  SEEK MEDICAL CARE IF:  You have any changes from your previous  cluster headaches either in intensity or frequency.   You are not getting relief from medicines you are taking.  SEEK IMMEDIATE MEDICAL CARE IF:   You faint.   You have weakness or numbness, especially on one side of your body or face.   You have double vision.   You have nausea or vomiting that is not relieved within several hours.   You cannot keep your balance or have difficulty talking or walking.   You have neck pain or stiffness.   You have a fever. MAKE SURE YOU:  Understand these instructions.   Will watch your condition.   Will get help right away if you are not doing well or get worse.   This information is not intended to replace advice given to you by your health care provider. Make sure you discuss any questions you have with your health care provider.   Headache migraine-like go home and rest in dark room. Take Phenergan as needed. Follow-up with your doctors if not improved. Return for any new or worse symptoms.   Document Released: 01/03/2005 Document Revised: 10/24/2012 Document Reviewed: 07/26/2012 Elsevier Interactive Patient Education [employer]

## 2015-01-02 NOTE — ED Provider Notes (Signed)
CSN: [id number]     Arrival date & time 01/02/15  1405 History   First MD Initiated Contact with Patient 01/02/15 1420     Chief Complaint  Patient presents with  . Migraine     (Consider location/radiation/quality/duration/timing/severity/associated sxs/prior Treatment) Patient is a 29 y.o. female presenting with migraines. The history is provided by the patient.  Migraine Pertinent negatives include no chest pain, no abdominal pain, no headaches and no shortness of breath.   patient with known history of migraines more cluster headache type pattern. And fibro myalgia. Patient seen beginning of December with extensive workup to include head CT and lumbar puncture all was negative. The patient states that head hurts all over his been present for 5 days associated with nausea and vomiting and photophobia. It is similar to her past headaches. Also has body aches everywhere from her history of fibromyalgia. Head pain is 10 out of 10.  Past Medical History  Diagnosis Date  . Hypertension   . Asthma   . Pelvic inflammatory disease (PID)   . Sickle cell disease (HCC)   . Fibromyalgia   . Chronic abdominal pain   . Migraine    Past Surgical History  Procedure Laterality Date  . Tubal ligation    . Kidney infections    . Wisdom tooth extraction     Family History  Problem Relation Age of Onset  . Hypertension Mother   . Diabetes Mother   . Heart failure Father    Social History  Substance Use Topics  . Smoking status: Light Tobacco Smoker    Types: Cigarettes  . Smokeless tobacco: Never Used     Comment: Occasional smoker  . Alcohol Use: Yes     Comment: occasionally-once a month   OB History    Gravida Para Term Preterm AB TAB SAB Ectopic Multiple Living   Review of Systems  Constitutional: Negative for fever.  HENT: Negative for congestion.   Eyes: Positive for photophobia.  Respiratory: Negative for shortness of breath.   Cardiovascular: Negative  for chest pain.  Gastrointestinal: Positive for nausea and vomiting. Negative for abdominal pain.  Genitourinary: Negative for dysuria.  Musculoskeletal: Positive for myalgias.  Skin: Negative for rash.  Neurological: Negative for headaches.  Hematological: Does not bruise/bleed easily.  Psychiatric/Behavioral: Negative for confusion.      Allergies  Tomato; Aspirin; Doxycycline; Hydromorphone hcl; Ibuprofen; Metoclopramide; Other; Shellfish allergy; Toradol; and Latex  Home Medications   Prior to Admission medications   Medication Sig Start Date End Date Taking? Authorizing Provider  acetaminophen-codeine (TYLENOL #3) 300-30 MG tablet Take 1 tablet by mouth every 12 (twelve) hours as needed for moderate pain. 11/18/14  Yes Jaclyn Shaggy, MD  albuterol (PROVENTIL HFA;VENTOLIN HFA) 108 (90 BASE) MCG/ACT inhaler Inhale 2 puffs into the lungs every 6 (six) hours as needed for wheezing or shortness of breath. 01/14/12  Yes Consuello Masse, MD  amitriptyline (ELAVIL) 50 MG tablet Take 1 tablet (50 mg total) by mouth at bedtime. 11/17/14  Yes Suanne Marker, MD  atenolol (TENORMIN) 50 MG tablet Take 50 mg by mouth daily. 12/18/14  Yes Historical Provider, MD  butalbital-acetaminophen-caffeine (FIORICET, ESGIC) 50-325-40 MG tablet Take 2 tablets by mouth every 6 (six) hours as needed for headache. 10/17/14  Yes Jerald Kief, MD  cyanocobalamin 1000 MCG tablet Take 1 tablet (1,000 mcg total) by mouth daily. 11/18/14  Yes Jaclyn Shaggy, MD  cyclobenzaprine (FLEXERIL) 10 MG tablet Take 1 tablet (10 mg total) by mouth 3 (three) times daily as needed for muscle spasms. 11/18/14  Yes Jaclyn Shaggy, MD  diphenhydrAMINE (BENADRYL) 25 mg capsule Take 1 capsule (25 mg total) by mouth every 6 (six) hours as needed for itching. Also available OTC Patient taking differently: Take 25 mg by mouth at bedtime. Also available OTC 10/17/14  Yes Jerald Kief, MD  feeding supplement, ENSURE ENLIVE, (ENSURE ENLIVE)  LIQD Take 237 mLs by mouth 3 (three) times daily between meals. 10/17/14  Yes Jerald Kief, MD  FLUoxetine (PROZAC) 20 MG capsule Take 1 capsule (20 mg total) by mouth daily. 11/19/14  Yes Jaclyn Shaggy, MD  folic acid (FOLVITE) 1 MG tablet Take 1 tablet (1 mg total) by mouth daily. 10/17/14  Yes Jerald Kief, MD  gabapentin (NEURONTIN) 300 MG capsule Take 1 capsule (300 mg total) by mouth 3 (three) times daily. 11/18/14  Yes Jaclyn Shaggy, MD  magnesium oxide (MAG-OX) 400 (241.3 MG) MG tablet Take 1 tablet (400 mg total) by mouth 2 (two) times daily. 11/18/14  Yes Jaclyn Shaggy, MD  nicotine (NICODERM CQ - DOSED IN MG/24 HOURS) 21 mg/24hr patch Place 1 patch (21 mg total) onto the skin daily. 10/17/14  Yes Jerald Kief, MD  nystatin (MYCOSTATIN) 100000 UNIT/ML suspension Take 5 mLs (500,000 Units total) by mouth 4 (four) times daily. 11/18/14  Yes Jaclyn Shaggy, MD  oxyCODONE (OXY IR/ROXICODONE) 5 MG immediate release tablet Take 1 tablet (5 mg total) by mouth every 4 (four) hours as needed for severe pain or breakthrough pain. 10/17/14  Yes Jerald Kief, MD  oxyCODONE-acetaminophen (PERCOCET/ROXICET) 5-325 MG tablet Take 1 tablet by mouth every 6 (six) hours as needed. 12/05/14  Yes Bethann Berkshire, MD  pantoprazole (PROTONIX) 40 MG tablet Take 1 tablet (40 mg total) by mouth daily. 11/18/14  Yes Jaclyn Shaggy, MD  polyethylene glycol (MIRALAX) packet Take 17 g by mouth daily. 10/17/14  Yes Jerald Kief, MD  promethazine (PHENERGAN) 25 MG tablet Take 1 tablet (25 mg total) by mouth every 6 (six) hours as needed for nausea or vomiting. 12/05/14  Yes Bethann Berkshire, MD  promethazine (PHENERGAN) 25 MG tablet Take 1 tablet (25 mg total) by mouth every 6 (six) hours as needed. 12/18/14  Yes Vanetta Mulders, MD  promethazine (PHENERGAN) 6.25 MG/5ML syrup Take 20 mLs (25 mg total) by mouth every 6 (six) hours as needed for nausea or vomiting. 01/02/15   Vanetta Mulders, MD  UNABLE TO FIND Outpatient physical  therapy  Diagnosis: lower extremity weakness, B12 deficiency 10/16/14   Ripudeep K Rai, MD   BP 137/83 mmHg  Pulse 79  Temp(Src) 97.5 F (36.4 C) (Oral)  Resp 16  Ht 5' (1.524 m)  Wt 72.122 kg  BMI 31.05 kg/m2  SpO2 100%  LMP 01/01/2015 Physical Exam  Constitutional: She is oriented to person, place, and time. She appears well-developed and well-nourished. No distress.  HENT:  Head: Normocephalic and atraumatic.  Mouth/Throat: Oropharynx is clear and moist.  Eyes: Conjunctivae and EOM are normal. Pupils are equal, round, and reactive to light.  Neck: Normal range of motion. Neck supple.  Cardiovascular: Normal rate, regular rhythm and normal heart sounds.   Pulmonary/Chest: Effort normal and breath sounds normal. No respiratory distress.  Abdominal: Soft. There is no tenderness.  Musculoskeletal: Normal range of motion. She exhibits no edema.  Neurological: She is alert and oriented to person, place, and time. No cranial  nerve deficit. She exhibits normal muscle tone. Coordination normal.  Skin: Skin is warm. No rash noted.  Nursing note and vitals reviewed.   ED Course  Procedures (including critical care time) Labs Review Labs Reviewed  BASIC METABOLIC PANEL  CBC WITH DIFFERENTIAL/PLATELET   Results for orders placed or performed during the hospital encounter of 01/02/15  Basic metabolic panel  Result Value Ref Range   Sodium 140 135 - 145 mmol/L   Potassium 3.6 3.5 - 5.1 mmol/L   Chloride 104 101 - 111 mmol/L   CO2 25 22 - 32 mmol/L   Glucose, Bld 79 65 - 99 mg/dL   BUN 7 6 - 20 mg/dL   Creatinine, Ser 6.06 0.44 - 1.00 mg/dL   Calcium 9.6 8.9 - 30.1 mg/dL   GFR calc non Af Amer >60 >60 mL/min   GFR calc Af Amer >60 >60 mL/min   Anion gap 11 5 - 15  CBC with Differential/Platelet  Result Value Ref Range   WBC 9.5 4.0 - 10.5 K/uL   RBC 4.91 3.87 - 5.11 MIL/uL   Hemoglobin 14.2 12.0 - 15.0 g/dL   HCT 60.1 09.3 - 23.5 %   MCV 82.5 78.0 - 100.0 fL   MCH 28.9  26.0 - 34.0 pg   MCHC 35.1 30.0 - 36.0 g/dL   RDW 57.3 22.0 - 25.4 %   Platelets 232 150 - 400 K/uL   Neutrophils Relative % 50 %   Neutro Abs 4.7 1.7 - 7.7 K/uL   Lymphocytes Relative 39 %   Lymphs Abs 3.7 0.7 - 4.0 K/uL   Monocytes Relative 8 %   Monocytes Absolute 0.7 0.1 - 1.0 K/uL   Eosinophils Relative 3 %   Eosinophils Absolute 0.3 0.0 - 0.7 K/uL   Basophils Relative 0 %   Basophils Absolute 0.0 0.0 - 0.1 K/uL     Imaging Review No results found. I have personally reviewed and evaluated these images and lab results as part of my medical decision-making.   EKG Interpretation None      MDM   Final diagnoses:  Intractable episodic cluster headache  Fibromyalgia    Patient with a history of migraines cluster headaches. Seems to be similar to her cluster headaches. Pain all over his present for 5 days associated with nausea and vomiting. Patient improved here with migraine cocktail which included Benadryl, Phenergan and Decadron. Patient also supplemented with hydromorphone. Patient showing signs of improvement. Patient recently seen for headache hadn't LP done at that time to rule out any infection or other problems and that was negative. This headache is similar to that. Patient also treated with 100% nonrebreather oxygen. Headache is improving but not completely resolved. Labs without significant abnormalities. Patient will be discharged home with Phenergan.    Vanetta Mulders, MD 01/02/15 1827

## 2015-01-04 ENCOUNTER — Emergency Department (HOSPITAL_COMMUNITY)
Admission: EM | Admit: 2015-01-04 | Discharge: 2015-01-04 | Disposition: A | Discharge status: Home / Self Care | Payer: Medicaid Other | Attending: Emergency Medicine | Admitting: Emergency Medicine

## 2015-01-04 ENCOUNTER — Encounter (HOSPITAL_COMMUNITY): Payer: Self-pay | Admitting: Nurse Practitioner

## 2015-01-04 DIAGNOSIS — Z862 Personal history of diseases of the blood and blood-forming organs and certain disorders involving the immune mechanism: Secondary | ICD-10-CM | POA: Insufficient documentation

## 2015-01-04 DIAGNOSIS — J45909 Unspecified asthma, uncomplicated: Secondary | ICD-10-CM | POA: Insufficient documentation

## 2015-01-04 DIAGNOSIS — Z91040 Latex allergy status: Secondary | ICD-10-CM | POA: Diagnosis not present

## 2015-01-04 DIAGNOSIS — G43909 Migraine, unspecified, not intractable, without status migrainosus: Secondary | ICD-10-CM | POA: Insufficient documentation

## 2015-01-04 DIAGNOSIS — Z79899 Other long term (current) drug therapy: Secondary | ICD-10-CM | POA: Insufficient documentation

## 2015-01-04 DIAGNOSIS — I10 Essential (primary) hypertension: Secondary | ICD-10-CM | POA: Insufficient documentation

## 2015-01-04 DIAGNOSIS — G8929 Other chronic pain: Secondary | ICD-10-CM | POA: Insufficient documentation

## 2015-01-04 DIAGNOSIS — Z8742 Personal history of other diseases of the female genital tract: Secondary | ICD-10-CM | POA: Diagnosis not present

## 2015-01-04 DIAGNOSIS — R509 Fever, unspecified: Secondary | ICD-10-CM | POA: Diagnosis not present

## 2015-01-04 DIAGNOSIS — F17210 Nicotine dependence, cigarettes, uncomplicated: Secondary | ICD-10-CM | POA: Diagnosis not present

## 2015-01-04 DIAGNOSIS — R52 Pain, unspecified: Secondary | ICD-10-CM | POA: Diagnosis present

## 2015-01-04 DIAGNOSIS — M797 Fibromyalgia: Secondary | ICD-10-CM | POA: Diagnosis not present

## 2015-01-04 LAB — CBC
HEMATOCRIT: 38.7 % (ref 36.0–46.0)
HEMOGLOBIN: 13.3 g/dL (ref 12.0–15.0)
MCH: 28.5 pg (ref 26.0–34.0)
MCHC: 34.4 g/dL (ref 30.0–36.0)
MCV: 82.9 fL (ref 78.0–100.0)
Platelets: 242 10*3/uL (ref 150–400)
RBC: 4.67 MIL/uL (ref 3.87–5.11)
RDW: 13.5 % (ref 11.5–15.5)
WBC: 11.2 10*3/uL — ABNORMAL HIGH (ref 4.0–10.5)

## 2015-01-04 LAB — COMPREHENSIVE METABOLIC PANEL
ALBUMIN: 3.7 g/dL (ref 3.5–5.0)
ALT: 13 U/L — ABNORMAL LOW (ref 14–54)
ANION GAP: 6 (ref 5–15)
AST: 23 U/L (ref 15–41)
Alkaline Phosphatase: 54 U/L (ref 38–126)
BILIRUBIN TOTAL: 0.3 mg/dL (ref 0.3–1.2)
BUN: 7 mg/dL (ref 6–20)
CHLORIDE: 110 mmol/L (ref 101–111)
CO2: 24 mmol/L (ref 22–32)
Calcium: 9 mg/dL (ref 8.9–10.3)
Creatinine, Ser: 0.71 mg/dL (ref 0.44–1.00)
GFR calc Af Amer: >60 mL/min (ref >60)
GFR calc non Af Amer: >60 mL/min (ref >60)
GLUCOSE: 83 mg/dL (ref 65–99)
POTASSIUM: 3.8 mmol/L (ref 3.5–5.1)
SODIUM: 140 mmol/L (ref 135–145)
TOTAL PROTEIN: 7.1 g/dL (ref 6.5–8.1)

## 2015-01-04 LAB — VITAMIN B12: Vitamin B-12: 205 pg/mL (ref 180–914)

## 2015-01-04 LAB — RAPID STREP SCREEN (MED CTR MEBANE ONLY): Streptococcus, Group A Screen (Direct): NEGATIVE

## 2015-01-04 LAB — CK: Total CK: 69 U/L (ref 38–234)

## 2015-01-04 MED ORDER — SODIUM CHLORIDE 0.9 % IV BOLUS (SEPSIS)
1000.0000 mL | Freq: Once | INTRAVENOUS | Status: AC
  Administered 2015-01-04: 1000 mL via INTRAVENOUS

## 2015-01-04 MED ORDER — PROCHLORPERAZINE EDISYLATE 5 MG/ML IJ SOLN
10.0000 mg | Freq: Four times a day (QID) | INTRAMUSCULAR | Status: DC | PRN
  Administered 2015-01-04: 10 mg via INTRAVENOUS
  Filled 2015-01-04: qty 2

## 2015-01-04 MED ORDER — OXYCODONE-ACETAMINOPHEN 5-325 MG PO TABS
1.0000 | ORAL_TABLET | Freq: Once | ORAL | Status: DC
  Filled 2015-01-04: qty 1

## 2015-01-04 MED ORDER — KETOROLAC TROMETHAMINE 30 MG/ML IJ SOLN
30.0000 mg | Freq: Once | INTRAMUSCULAR | Status: AC
  Administered 2015-01-04: 30 mg via INTRAVENOUS
  Filled 2015-01-04: qty 1

## 2015-01-04 MED ORDER — CYCLOBENZAPRINE HCL 10 MG PO TABS: 10.0000 mg | ORAL_TABLET | Freq: Two times a day (BID) | ORAL | Status: DC | PRN

## 2015-01-04 MED ORDER — DIPHENHYDRAMINE HCL 50 MG/ML IJ SOLN
25.0000 mg | Freq: Once | INTRAMUSCULAR | Status: AC
  Administered 2015-01-04: 25 mg via INTRAVENOUS
  Filled 2015-01-04: qty 1

## 2015-01-04 NOTE — ED Notes (Signed)
Pt notified of need for urine. ?

## 2015-01-04 NOTE — ED Notes (Signed)
Pt reports fevers, chills, body aches, n/v over past week. States she was seen at [facility] for same this week and symptoms are not better. She reports she can not sleep or eat due to symptoms

## 2015-01-04 NOTE — Discharge Instructions (Signed)
Myofascial Pain Syndrome and Fibromyalgia Myofascial pain syndrome and fibromyalgia are both pain disorders. This pain may be felt mainly in your muscles.   Myofascial pain syndrome:  Always has trigger points or tender points in the muscle that will cause pain when pressed. The pain may come and go.  Usually affects your neck, upper back, and shoulder areas. The pain often radiates into your arms and hands.  Fibromyalgia:  Has muscle pains and tenderness that come and go.  Is often associated with fatigue and sleep disturbances.  Has trigger points.  Tends to be long-lasting (chronic), but is not life-threatening. Fibromyalgia and myofascial pain are not the same. However, they often occur together. If you have both conditions, each can make the other worse. Both are common and can cause enough pain and fatigue to make day-to-day activities difficult.  CAUSES  The exact causes of fibromyalgia and myofascial pain are not known. People with certain gene types may be more likely to develop fibromyalgia. Some factors can be triggers for both conditions, such as:   Spine disorders.  Arthritis.  Severe injury (trauma) and other physical stressors.  Being under a lot of stress.  A medical illness. SIGNS AND SYMPTOMS  Fibromyalgia The main symptom of fibromyalgia is widespread pain and tenderness in your muscles. This can vary over time. Pain is sometimes described as stabbing, shooting, or burning. You may have tingling or numbness, too. You may also have sleep problems and fatigue. You may wake up feeling tired and groggy (fibro fog). Other symptoms may include:   Bowel and bladder problems.  Headaches.  Visual problems.  Problems with odors and noises.  Depression or mood changes.  Painful menstrual periods (dysmenorrhea).  Dry skin or eyes. Myofascial pain syndrome Symptoms of myofascial pain syndrome include:   Tight, ropy bands of muscle.   Uncomfortable  sensations in muscular areas, such as:  Aching.  Cramping.  Burning.  Numbness.  Tingling.   Muscle weakness.  Trouble moving certain muscles freely (range of motion). DIAGNOSIS  There are no specific tests to diagnose fibromyalgia or myofascial pain syndrome. Both can be hard to diagnose because their symptoms are common in many other conditions. Your health care provider may suspect one or both of these conditions based on your symptoms and medical history. Your health care provider will also do a physical exam.  The key to diagnosing fibromyalgia is having pain, fatigue, and other symptoms for more than three months that cannot be explained by another condition.  The key to diagnosing myofascial pain syndrome is finding trigger points in muscles that are tender and cause pain elsewhere in your body (referred pain). TREATMENT  Treating fibromyalgia and myofascial pain often requires a team of health care providers. This usually starts with your primary provider and a physical therapist. You may also find it helpful to work with alternative health care providers, such as massage therapists or acupuncturists. Treatment for fibromyalgia may include medicines. This may include nonsteroidal anti-inflammatory drugs (NSAIDs), along with other medicines.  Treatment for myofascial pain may also include:  NSAIDs.  Cooling and stretching of muscles.  Trigger point injections.  Sound wave (ultrasound) treatments to stimulate muscles. HOME CARE INSTRUCTIONS   Take medicines only as directed by your health care provider.  Exercise as directed by your health care provider or physical therapist.  Try to avoid stressful situations.  Practice relaxation techniques to control your stress. You may want to try:  Biofeedback.  Visual imagery.  Hypnosis.  Muscle relaxation.  Yoga.  Meditation.  Talk to your health care provider about alternative treatments, such as acupuncture or  massage treatment.  Maintain a healthy lifestyle. This includes eating a healthy diet and getting enough sleep.  Consider joining a support group.  Do not do activities that stress or strain your muscles. That includes repetitive motions and heavy lifting. SEEK MEDICAL CARE IF:   You have new symptoms.  Your symptoms get worse.  You have side effects from your medicines.  You have trouble sleeping.  Your condition is causing depression or anxiety. FOR MORE INFORMATION   National Fibromyalgia Association: http://www.fmaware.orgwww.fmaware.org  Arthritis Foundation: http://www.arthritis.orgwww.arthritis.org  American Chronic Pain Association: michaeledo.com.CandyDash.co.za   This information is not intended to replace advice given to you by your health care provider. Make sure you discuss any questions you have with your health care provider.  Follow up with your primary care provider for further evaluation. Return to the Emergency Department if you experience worsening of your symptoms, fever, difficulty breathing, numbness/tingling in your extremities.

## 2015-01-04 NOTE — ED Notes (Signed)
Attempted IV without results.  Placed pt on bedpan for urine sample.  Pt will call out when she is finished.

## 2015-01-04 NOTE — ED Notes (Signed)
MD at bedside. 

## 2015-01-05 ENCOUNTER — Ambulatory Visit: Payer: Medicaid Other | Admitting: Diagnostic Neuroimaging

## 2015-01-05 ENCOUNTER — Telehealth: Payer: Self-pay | Admitting: Diagnostic Neuroimaging

## 2015-01-05 LAB — URINE MICROSCOPIC-ADD ON

## 2015-01-05 LAB — URINALYSIS, ROUTINE W REFLEX MICROSCOPIC
BILIRUBIN URINE: NEGATIVE
GLUCOSE, UA: NEGATIVE mg/dL
Ketones, ur: NEGATIVE mg/dL
LEUKOCYTES UA: NEGATIVE
NITRITE: NEGATIVE
PH: 6 (ref 5.0–8.0)
Protein, ur: NEGATIVE mg/dL
SPECIFIC GRAVITY, URINE: 1.029 (ref 1.005–1.030)

## 2015-01-05 NOTE — Telephone Encounter (Signed)
Spoke with patient who states she cancelled appointment today "because the dr told me not to go out because I have to keep my eyes shut". Scheduled her for FU next week; she had no other requests. She verbalized understanding.

## 2015-01-05 NOTE — Telephone Encounter (Signed)
Patient called back to advise her phone has been cut off, however she has 2 different phone apps where we can call back, [phone] or [phone].

## 2015-01-05 NOTE — Telephone Encounter (Signed)
Patient returned your call. Can be reached @[phone] - .

## 2015-01-05 NOTE — Telephone Encounter (Signed)
Received call from number patient left to call, and female stated she is patient's "girl friend", and she is at work. She gave this number [phone], to reach patient. Called [phone], LVM for return call.

## 2015-01-05 NOTE — Telephone Encounter (Signed)
Patient called in to cancel her appointment today as she just came from the ER due to severe migraines to the point of throwing up.  She states she does not have medication to take.  Please call.

## 2015-01-05 NOTE — Telephone Encounter (Signed)
LVM requesting call back. Left this caller's name, number. 

## 2015-01-06 LAB — CULTURE, GROUP A STREP: Strep A Culture: NEGATIVE

## 2015-01-06 NOTE — ED Provider Notes (Signed)
CSN: [id number]     Arrival date & time 01/04/15  1630 History   First MD Initiated Contact with Patient 01/04/15 1816     Chief Complaint  Patient presents with  . Fever  . Generalized Body Aches     (Consider location/radiation/quality/duration/timing/severity/associated sxs/prior Treatment) HPI   Tanya Krause is a 29 y.o F with a pmhx of fibromyalgia, Vit B deficiency, migraines, chronic pain who presents to the ED today c/o generalized body aches. Pt states that over the last week she has been having progressively worsening generalized body aches.Now pt states that her body hurts so much that she cannot walk. Pt states that she has been taking home tylenol #3 with no relief. Pt reports subjective fevers. Pt has had similar symptoms in the past and it was due to both fibromyalgia and Vit B deficiency. Pt states she has been taking home Vit B supplements as prescribed. Pt also reports nausea and vomiting, NBNB. Denies, blurry vision, numbness, paresthesias, melena, hematochezia, SOB, chest pain, dizziness, syncope.  Of note, pt was seen at the beginning of December for similar symptoms and had extensive workup including lumbar puncture, CT scans which were all negative. She also presented on 12/16 for migraine. Past Medical History  Diagnosis Date  . Hypertension   . Asthma   . Pelvic inflammatory disease (PID)   . Sickle cell disease (HCC)   . Fibromyalgia   . Chronic abdominal pain   . Migraine    Past Surgical History  Procedure Laterality Date  . Tubal ligation    . Kidney infections    . Wisdom tooth extraction     Family History  Problem Relation Age of Onset  . Hypertension Mother   . Diabetes Mother   . Heart failure Father    Social History  Substance Use Topics  . Smoking status: Light Tobacco Smoker    Types: Cigarettes  . Smokeless tobacco: Never Used     Comment: Occasional smoker  . Alcohol Use: Yes     Comment: occasionally-once a month   OB History     Gravida Para Term Preterm AB TAB SAB Ectopic Multiple Living   Review of Systems  All other systems reviewed and are negative.     Allergies  Tomato; Aspirin; Doxycycline; Hydromorphone hcl; Ibuprofen; Metoclopramide; Other; Shellfish allergy; Toradol; and Latex  Home Medications   Prior to Admission medications   Medication Sig Start Date End Date Taking? Authorizing Provider  albuterol (PROVENTIL HFA;VENTOLIN HFA) 108 (90 BASE) MCG/ACT inhaler Inhale 2 puffs into the lungs every 6 (six) hours as needed for wheezing or shortness of breath. 01/14/12  Yes Consuello Masse, MD  amitriptyline (ELAVIL) 50 MG tablet Take 1 tablet (50 mg total) by mouth at bedtime. 11/17/14  Yes Suanne Marker, MD  atenolol (TENORMIN) 50 MG tablet Take 50 mg by mouth daily. 12/18/14  Yes Historical Provider, MD  butalbital-acetaminophen-caffeine (FIORICET, ESGIC) 50-325-40 MG tablet Take 2 tablets by mouth every 6 (six) hours as needed for headache. Patient taking differently: Take 1-2 tablets by mouth every 6 (six) hours as needed for headache.  10/17/14  Yes Jerald Kief, MD  cyanocobalamin 1000 MCG tablet Take 1 tablet (1,000 mcg total) by mouth daily. 11/18/14  Yes Jaclyn Shaggy, MD  diphenhydrAMINE (BENADRYL) 25 mg capsule Take 1 capsule (25 mg total) by mouth every 6 (six) hours as needed for itching. Also available  OTC 10/17/14  Yes Jerald Kief, MD  feeding supplement, ENSURE ENLIVE, (ENSURE ENLIVE) LIQD Take 237 mLs by mouth 3 (three) times daily between meals. 10/17/14  Yes Jerald Kief, MD  folic acid (FOLVITE) 1 MG tablet Take 1 tablet (1 mg total) by mouth daily. 10/17/14  Yes Jerald Kief, MD  gabapentin (NEURONTIN) 300 MG capsule Take 1 capsule (300 mg total) by mouth 3 (three) times daily. Patient taking differently: Take 900 mg by mouth 3 (three) times daily.  11/18/14  Yes Jaclyn Shaggy, MD  magnesium oxide (MAG-OX) 400 (241.3 MG) MG tablet Take 1 tablet (400 mg total)  by mouth 2 (two) times daily. 11/18/14  Yes Jaclyn Shaggy, MD  nicotine (NICODERM CQ - DOSED IN MG/24 HOURS) 21 mg/24hr patch Place 1 patch (21 mg total) onto the skin daily. 10/17/14  Yes Jerald Kief, MD  nystatin (MYCOSTATIN) 100000 UNIT/ML suspension Take 5 mLs (500,000 Units total) by mouth 4 (four) times daily. Patient taking differently: Take 5 mLs by mouth 4 (four) times daily as needed (thrush).  11/18/14  Yes Jaclyn Shaggy, MD  pantoprazole (PROTONIX) 40 MG tablet Take 1 tablet (40 mg total) by mouth daily. Patient taking differently: Take 40 mg by mouth daily before supper.  11/18/14  Yes Jaclyn Shaggy, MD  polyethylene glycol (MIRALAX) packet Take 17 g by mouth daily. Patient taking differently: Take 17 g by mouth daily as needed (constipation). Mix in 8 oz of liquid and drink 10/17/14  Yes Jerald Kief, MD  promethazine (PHENERGAN) 6.25 MG/5ML syrup Take 20 mLs (25 mg total) by mouth every 6 (six) hours as needed for nausea or vomiting. 01/02/15  Yes Vanetta Mulders, MD  acetaminophen-codeine (TYLENOL #3) 300-30 MG tablet Take 1 tablet by mouth every 12 (twelve) hours as needed for moderate pain. Patient not taking: Reported on 01/04/2015 11/18/14   Jaclyn Shaggy, MD  cyclobenzaprine (FLEXERIL) 10 MG tablet Take 1 tablet (10 mg total) by mouth 3 (three) times daily as needed for muscle spasms. Patient not taking: Reported on 01/04/2015 11/18/14   Jaclyn Shaggy, MD  cyclobenzaprine (FLEXERIL) 10 MG tablet Take 1 tablet (10 mg total) by mouth 2 (two) times daily as needed for muscle spasms. 01/04/15   Radiah Lubinski Tripp Anastasha Ortez, PA-C  FLUoxetine (PROZAC) 20 MG capsule Take 1 capsule (20 mg total) by mouth daily. Patient not taking: Reported on 01/04/2015 11/19/14   Jaclyn Shaggy, MD  oxyCODONE (OXY IR/ROXICODONE) 5 MG immediate release tablet Take 1 tablet (5 mg total) by mouth every 4 (four) hours as needed for severe pain or breakthrough pain. Patient not taking: Reported on 01/04/2015 10/17/14    Jerald Kief, MD  oxyCODONE-acetaminophen (PERCOCET/ROXICET) 5-325 MG tablet Take 1 tablet by mouth every 6 (six) hours as needed. Patient not taking: Reported on 01/04/2015 12/05/14   Bethann Berkshire, MD  promethazine (PHENERGAN) 25 MG tablet Take 1 tablet (25 mg total) by mouth every 6 (six) hours as needed for nausea or vomiting. Patient not taking: Reported on 01/04/2015 12/05/14   Bethann Berkshire, MD  promethazine (PHENERGAN) 25 MG tablet Take 1 tablet (25 mg total) by mouth every 6 (six) hours as needed. Patient not taking: Reported on 01/04/2015 12/18/14   Vanetta Mulders, MD  UNABLE TO FIND Outpatient physical therapy  Diagnosis: lower extremity weakness, B12 deficiency 10/16/14   Ripudeep K Rai, MD   BP 108/75 mmHg  Pulse 85  Temp(Src) 97.9 F (36.6 C) (Oral)  Resp 16  SpO2 100%  LMP 01/01/2015 Physical Exam  Constitutional: She is oriented to person, place, and time. She appears well-developed and well-nourished. She appears distressed.  HENT:  Head: Normocephalic and atraumatic.  Mouth/Throat: No oropharyngeal exudate.  Eyes: Conjunctivae and EOM are normal. Pupils are equal, round, and reactive to light. Right eye exhibits no discharge. Left eye exhibits no discharge. No scleral icterus.  Neck: Neck supple. No JVD present.  Cardiovascular: Normal rate, regular rhythm, normal heart sounds and intact distal pulses.  Exam reveals no gallop and no friction rub.   No murmur heard. Pulmonary/Chest: Effort normal and breath sounds normal. No respiratory distress. She has no wheezes. She has no rales. She exhibits no tenderness.  Abdominal: Soft. She exhibits no distension. There is no tenderness. There is no guarding.  Musculoskeletal: Normal range of motion. She exhibits tenderness. She exhibits no edema.  Pt is significantly TTP on all skin surfaces. Pain is out of proportion to exam. No obvious bony deformity. FROM of all extremities, however pt reports significant pain with  movement of extremities.   Lymphadenopathy:    She has no cervical adenopathy.  Neurological: She is alert and oriented to person, place, and time. She has normal reflexes. No cranial nerve deficit. She exhibits normal muscle tone. Coordination normal.  Decreased strength in all 4 extremities. Concern that this may be due to poor effort. No sensory deficits.   Skin: Skin is warm and dry. No rash noted. She is not diaphoretic. No erythema. No pallor.  Psychiatric: She has a normal mood and affect. Her behavior is normal.  Pt very tearful on exam.  Nursing note and vitals reviewed.   ED Course  Procedures (including critical care time) Labs Review Labs Reviewed  COMPREHENSIVE METABOLIC PANEL - Abnormal; Notable for the following:    ALT 13 (*)    All other components within normal limits  CBC - Abnormal; Notable for the following:    WBC 11.2 (*)    All other components within normal limits  URINALYSIS, ROUTINE W REFLEX MICROSCOPIC (NOT AT [facility]) - Abnormal; Notable for the following:    APPearance CLOUDY (*)    Hgb urine dipstick SMALL (*)    All other components within normal limits  URINE MICROSCOPIC-ADD ON - Abnormal; Notable for the following:    Squamous Epithelial / LPF 6-30 (*)    Bacteria, UA MANY (*)    Casts HYALINE CASTS (*)    All other components within normal limits  RAPID STREP SCREEN (NOT AT [facility])  CULTURE, GROUP A STREP  VITAMIN B12  CK    Imaging Review No results found. I have personally reviewed and evaluated these images and lab results as part of my medical decision-making.   EKG Interpretation None      MDM   Final diagnoses:  Fibromyalgia   Pt with extensive hx of chronic pain, fibromyalgia, Vit B deficiency presents for generalized body aches onset 1 week ago. Pt very tearful on exam. Pt afebrile, all VSS. Pt is significantly tender to palpation on all skin surfaces. In attempt to palpate LE, pt screaming in pain. Pain out of proportion to  exam. Upon review of pt documentation she was admitted in July for same symptoms as was found to have Vit B deficiency. Per documentation pt was requesting around the clock Dilaudid and it became a concern that she was exhibiting drug seeking behaviors. She has presented to ED with same symptoms many times since this. Complete workup including LP and CT scans  were performed which were all negative. Will check blood work today including CK and Vit B to r/o. Discussed with pt that we will treat pain with non-narcotic means today including fluids, toradol, compazine and benadryl. Pt agrees to this.   Lab work unremarkable. Normal CK and Vit B level. Pt reports symptomatic improvement after intervention. Pt ambulates with a walker at baseline and was able to do this in ED. Will d/c home with flexeril. Encourage pt to follow up with PCP and pain management. Discussed treatment plan with pt who is agreeable. Return precautions outlined in patient discharge instructions.   Patient was discussed with and seen by Dr. Dalene Seltzer who agrees with the treatment plan.      Lester Kinsman Goodrich, PA-C 01/07/15 0157  Alvira Monday, MD 01/08/15 [phone]

## 2015-01-11 ENCOUNTER — Emergency Department (HOSPITAL_COMMUNITY)
Admission: EM | Admit: 2015-01-11 | Discharge: 2015-01-12 | Disposition: A | Discharge status: Home / Self Care | Payer: Medicaid Other | Attending: Emergency Medicine | Admitting: Emergency Medicine

## 2015-01-11 ENCOUNTER — Encounter (HOSPITAL_COMMUNITY): Payer: Self-pay

## 2015-01-11 DIAGNOSIS — M797 Fibromyalgia: Secondary | ICD-10-CM | POA: Diagnosis not present

## 2015-01-11 DIAGNOSIS — F17210 Nicotine dependence, cigarettes, uncomplicated: Secondary | ICD-10-CM | POA: Insufficient documentation

## 2015-01-11 DIAGNOSIS — Z3202 Encounter for pregnancy test, result negative: Secondary | ICD-10-CM | POA: Diagnosis not present

## 2015-01-11 DIAGNOSIS — R300 Dysuria: Secondary | ICD-10-CM | POA: Diagnosis present

## 2015-01-11 DIAGNOSIS — Z8742 Personal history of other diseases of the female genital tract: Secondary | ICD-10-CM | POA: Insufficient documentation

## 2015-01-11 DIAGNOSIS — Z792 Long term (current) use of antibiotics: Secondary | ICD-10-CM | POA: Insufficient documentation

## 2015-01-11 DIAGNOSIS — Z91040 Latex allergy status: Secondary | ICD-10-CM | POA: Insufficient documentation

## 2015-01-11 DIAGNOSIS — Z9851 Tubal ligation status: Secondary | ICD-10-CM | POA: Insufficient documentation

## 2015-01-11 DIAGNOSIS — Z862 Personal history of diseases of the blood and blood-forming organs and certain disorders involving the immune mechanism: Secondary | ICD-10-CM | POA: Insufficient documentation

## 2015-01-11 DIAGNOSIS — J45909 Unspecified asthma, uncomplicated: Secondary | ICD-10-CM | POA: Insufficient documentation

## 2015-01-11 DIAGNOSIS — G8929 Other chronic pain: Secondary | ICD-10-CM

## 2015-01-11 DIAGNOSIS — I10 Essential (primary) hypertension: Secondary | ICD-10-CM | POA: Diagnosis not present

## 2015-01-11 DIAGNOSIS — N390 Urinary tract infection, site not specified: Secondary | ICD-10-CM | POA: Diagnosis not present

## 2015-01-11 DIAGNOSIS — Z79899 Other long term (current) drug therapy: Secondary | ICD-10-CM | POA: Insufficient documentation

## 2015-01-11 LAB — CBC WITH DIFFERENTIAL/PLATELET
BASOS PCT: 0 %
Basophils Absolute: 0 10*3/uL (ref 0.0–0.1)
Eosinophils Absolute: 0.4 10*3/uL (ref 0.0–0.7)
Eosinophils Relative: 4 %
HEMATOCRIT: 37.2 % (ref 36.0–46.0)
HEMOGLOBIN: 12.9 g/dL (ref 12.0–15.0)
LYMPHS ABS: 4.5 10*3/uL — AB (ref 0.7–4.0)
Lymphocytes Relative: 45 %
MCH: 28.5 pg (ref 26.0–34.0)
MCHC: 34.7 g/dL (ref 30.0–36.0)
MCV: 82.1 fL (ref 78.0–100.0)
MONO ABS: 0.8 10*3/uL (ref 0.1–1.0)
MONOS PCT: 7 %
NEUTROS ABS: 4.4 10*3/uL (ref 1.7–7.7)
Neutrophils Relative %: 44 %
Platelets: 213 10*3/uL (ref 150–400)
RBC: 4.53 MIL/uL (ref 3.87–5.11)
RDW: 13.5 % (ref 11.5–15.5)
WBC: 10.1 10*3/uL (ref 4.0–10.5)

## 2015-01-11 LAB — URINALYSIS, ROUTINE W REFLEX MICROSCOPIC
BILIRUBIN URINE: NEGATIVE
GLUCOSE, UA: NEGATIVE mg/dL
KETONES UR: NEGATIVE mg/dL
NITRITE: NEGATIVE
PROTEIN: 30 mg/dL — AB
Specific Gravity, Urine: 1.02 (ref 1.005–1.030)
pH: 8.5 — ABNORMAL HIGH (ref 5.0–8.0)

## 2015-01-11 LAB — URINE MICROSCOPIC-ADD ON

## 2015-01-11 MED ORDER — PROMETHAZINE HCL 25 MG/ML IJ SOLN
12.5000 mg | Freq: Once | INTRAMUSCULAR | Status: AC
  Administered 2015-01-11: 12.5 mg via INTRAVENOUS
  Filled 2015-01-11: qty 1

## 2015-01-11 MED ORDER — SODIUM CHLORIDE 0.9 % IV BOLUS (SEPSIS)
1000.0000 mL | Freq: Once | INTRAVENOUS | Status: AC
  Administered 2015-01-11: 1000 mL via INTRAVENOUS

## 2015-01-11 NOTE — ED Notes (Signed)
Pt reports burning and throbbing with urination. Urinary pressure and urgency since last Monday. Unable to keep anything down

## 2015-01-12 LAB — BASIC METABOLIC PANEL
ANION GAP: 5 (ref 5–15)
BUN: 10 mg/dL (ref 6–20)
CHLORIDE: 109 mmol/L (ref 101–111)
CO2: 24 mmol/L (ref 22–32)
Calcium: 9 mg/dL (ref 8.9–10.3)
Creatinine, Ser: 0.65 mg/dL (ref 0.44–1.00)
GLUCOSE: 73 mg/dL (ref 65–99)
POTASSIUM: 4.6 mmol/L (ref 3.5–5.1)
SODIUM: 138 mmol/L (ref 135–145)

## 2015-01-12 LAB — PREGNANCY, URINE: PREG TEST UR: NEGATIVE

## 2015-01-12 MED ORDER — TRAMADOL HCL 50 MG PO TABS
50.0000 mg | ORAL_TABLET | Freq: Once | ORAL | Status: AC
  Administered 2015-01-12: 50 mg via ORAL
  Filled 2015-01-12: qty 1

## 2015-01-12 MED ORDER — PHENAZOPYRIDINE HCL 100 MG PO TABS
200.0000 mg | ORAL_TABLET | Freq: Once | ORAL | Status: AC
  Administered 2015-01-12: 200 mg via ORAL
  Filled 2015-01-12: qty 2

## 2015-01-12 MED ORDER — CEPHALEXIN 500 MG PO CAPS: 500.0000 mg | ORAL_CAPSULE | Freq: Four times a day (QID) | ORAL | Status: DC

## 2015-01-12 MED ORDER — CEPHALEXIN 500 MG PO CAPS
500.0000 mg | ORAL_CAPSULE | Freq: Once | ORAL | Status: AC
  Administered 2015-01-12: 500 mg via ORAL
  Filled 2015-01-12: qty 1

## 2015-01-12 MED ORDER — TRAMADOL HCL 50 MG PO TABS: 50.0000 mg | ORAL_TABLET | Freq: Four times a day (QID) | ORAL | Status: DC | PRN

## 2015-01-12 NOTE — ED Provider Notes (Signed)
CSN: [id number]     Arrival date & time 01/11/15  2121 History   First MD Initiated Contact with Patient 01/11/15 2204     Chief Complaint  Patient presents with  . Dysuria     (Consider location/radiation/quality/duration/timing/severity/associated sxs/prior Treatment) The history is provided by the patient.   Tanya Krause is a 29 y.o. female with a history significant for fibromyalgia, chronic abdominal pain presenting with a flare of her fibromyalgia, reporting generalized body aches which has not responded to tylenol (currently awaiting to establish with a chronic pain specialist in [place] in 3 days) and also has complaint of dysuria and increased urinary frequency.  She describes a darker than normal urine, urinating small amounts frequently with urgency and suprapubic pressure.  She also endorses bilateral low back pain.  She denies fevers or chills.  She does report several episodes of nausea with vomiting today.  She has found no alleviators.  She denies vaginal discharge.   Past Medical History  Diagnosis Date  . Hypertension   . Asthma   . Pelvic inflammatory disease (PID)   . Sickle cell disease (HCC)   . Fibromyalgia   . Chronic abdominal pain   . Migraine    Past Surgical History  Procedure Laterality Date  . Tubal ligation    . Kidney infections    . Wisdom tooth extraction     Family History  Problem Relation Age of Onset  . Hypertension Mother   . Diabetes Mother   . Heart failure Father    Social History  Substance Use Topics  . Smoking status: Light Tobacco Smoker    Types: Cigarettes  . Smokeless tobacco: Never Used     Comment: Occasional smoker  . Alcohol Use: Yes     Comment: occasionally-once a month   OB History    Gravida Para Term Preterm AB TAB SAB Ectopic Multiple Living   4 4 1 3      4      Review of Systems  Constitutional: Negative for fever.  HENT: Negative for congestion and sore throat.   Eyes: Negative.   Respiratory:  Negative for chest tightness and shortness of breath.   Cardiovascular: Negative for chest pain.  Gastrointestinal: Negative for nausea and abdominal pain.  Genitourinary: Positive for dysuria, urgency and frequency. Negative for vaginal discharge and vaginal pain.  Musculoskeletal: Positive for back pain. Negative for joint swelling, arthralgias and neck pain.  Skin: Negative.  Negative for rash and wound.  Neurological: Negative for dizziness, weakness, light-headedness, numbness and headaches.  Psychiatric/Behavioral: Negative.       Allergies  Tomato; Aspirin; Doxycycline; Hydromorphone hcl; Ibuprofen; Metoclopramide; Other; Shellfish allergy; Toradol; and Latex  Home Medications   Prior to Admission medications   Medication Sig Start Date End Date Taking? Authorizing Provider  acetaminophen-codeine (TYLENOL #3) 300-30 MG tablet Take 1 tablet by mouth every 12 (twelve) hours as needed for moderate pain. Patient not taking: Reported on 01/04/2015 11/18/14   13/1/16, MD  albuterol (PROVENTIL HFA;VENTOLIN HFA) 108 (90 BASE) MCG/ACT inhaler Inhale 2 puffs into the lungs every 6 (six) hours as needed for wheezing or shortness of breath. 01/14/12   01/16/12, MD  amitriptyline (ELAVIL) 50 MG tablet Take 1 tablet (50 mg total) by mouth at bedtime. 11/17/14   11/19/14, MD  atenolol (TENORMIN) 50 MG tablet Take 50 mg by mouth daily. 12/18/14   Historical Provider, MD  butalbital-acetaminophen-caffeine (FIORICET, ESGIC) 50-325-40 MG tablet Take 2  tablets by mouth every 6 (six) hours as needed for headache. Patient taking differently: Take 1-2 tablets by mouth every 6 (six) hours as needed for headache.  10/17/14   Jerald Kief, MD  cephALEXin (KEFLEX) 500 MG capsule Take 1 capsule (500 mg total) by mouth 4 (four) times daily. 01/12/15   Burgess Amor, PA-C  cyanocobalamin 1000 MCG tablet Take 1 tablet (1,000 mcg total) by mouth daily. 11/18/14   Jaclyn Shaggy, MD  cyclobenzaprine  (FLEXERIL) 10 MG tablet Take 1 tablet (10 mg total) by mouth 3 (three) times daily as needed for muscle spasms. Patient not taking: Reported on 01/04/2015 11/18/14   Jaclyn Shaggy, MD  cyclobenzaprine (FLEXERIL) 10 MG tablet Take 1 tablet (10 mg total) by mouth 2 (two) times daily as needed for muscle spasms. 01/04/15   Samantha Tripp Dowless, PA-C  diphenhydrAMINE (BENADRYL) 25 mg capsule Take 1 capsule (25 mg total) by mouth every 6 (six) hours as needed for itching. Also available OTC 10/17/14   Jerald Kief, MD  feeding supplement, ENSURE ENLIVE, (ENSURE ENLIVE) LIQD Take 237 mLs by mouth 3 (three) times daily between meals. 10/17/14   Jerald Kief, MD  FLUoxetine (PROZAC) 20 MG capsule Take 1 capsule (20 mg total) by mouth daily. Patient not taking: Reported on 01/04/2015 11/19/14   Jaclyn Shaggy, MD  folic acid (FOLVITE) 1 MG tablet Take 1 tablet (1 mg total) by mouth daily. 10/17/14   Jerald Kief, MD  gabapentin (NEURONTIN) 300 MG capsule Take 1 capsule (300 mg total) by mouth 3 (three) times daily. Patient taking differently: Take 900 mg by mouth 3 (three) times daily.  11/18/14   Jaclyn Shaggy, MD  magnesium oxide (MAG-OX) 400 (241.3 MG) MG tablet Take 1 tablet (400 mg total) by mouth 2 (two) times daily. 11/18/14   Jaclyn Shaggy, MD  nicotine (NICODERM CQ - DOSED IN MG/24 HOURS) 21 mg/24hr patch Place 1 patch (21 mg total) onto the skin daily. 10/17/14   Jerald Kief, MD  nystatin (MYCOSTATIN) 100000 UNIT/ML suspension Take 5 mLs (500,000 Units total) by mouth 4 (four) times daily. Patient taking differently: Take 5 mLs by mouth 4 (four) times daily as needed (thrush).  11/18/14   Jaclyn Shaggy, MD  oxyCODONE (OXY IR/ROXICODONE) 5 MG immediate release tablet Take 1 tablet (5 mg total) by mouth every 4 (four) hours as needed for severe pain or breakthrough pain. Patient not taking: Reported on 01/04/2015 10/17/14   Jerald Kief, MD  oxyCODONE-acetaminophen (PERCOCET/ROXICET) 5-325 MG tablet Take  1 tablet by mouth every 6 (six) hours as needed. Patient not taking: Reported on 01/04/2015 12/05/14   Bethann Berkshire, MD  pantoprazole (PROTONIX) 40 MG tablet Take 1 tablet (40 mg total) by mouth daily. Patient taking differently: Take 40 mg by mouth daily before supper.  11/18/14   Jaclyn Shaggy, MD  polyethylene glycol (MIRALAX) packet Take 17 g by mouth daily. Patient taking differently: Take 17 g by mouth daily as needed (constipation). Mix in 8 oz of liquid and drink 10/17/14   Jerald Kief, MD  promethazine (PHENERGAN) 25 MG tablet Take 1 tablet (25 mg total) by mouth every 6 (six) hours as needed for nausea or vomiting. Patient not taking: Reported on 01/04/2015 12/05/14   Bethann Berkshire, MD  promethazine (PHENERGAN) 25 MG tablet Take 1 tablet (25 mg total) by mouth every 6 (six) hours as needed. Patient not taking: Reported on 01/04/2015 12/18/14   Vanetta Mulders, MD  promethazine [facility])  6.25 MG/5ML syrup Take 20 mLs (25 mg total) by mouth every 6 (six) hours as needed for nausea or vomiting. 01/02/15   Vanetta Mulders, MD  traMADol (ULTRAM) 50 MG tablet Take 1 tablet (50 mg total) by mouth every 6 (six) hours as needed. 01/12/15   Burgess Amor, PA-C  UNABLE TO FIND Outpatient physical therapy  Diagnosis: lower extremity weakness, B12 deficiency 10/16/14   Ripudeep Jenna Luo, MD   BP 111/84 mmHg  Pulse 85  Temp(Src) 98.2 F (36.8 C) (Oral)  Resp 20  Ht 5' (1.524 m)  Wt 72.576 kg  BMI 31.25 kg/m2  SpO2 100%  LMP 01/01/2015 Physical Exam  Constitutional: She appears well-developed and well-nourished.  HENT:  Head: Normocephalic and atraumatic.  Eyes: Conjunctivae are normal.  Neck: Normal range of motion.  Cardiovascular: Normal rate, regular rhythm, normal heart sounds and intact distal pulses.   Pulmonary/Chest: Effort normal and breath sounds normal. She has no wheezes.  Abdominal: Soft. Bowel sounds are normal. There is tenderness in the suprapubic area. There is no guarding  and no CVA tenderness.  Mild suprapubic discomfort.  Musculoskeletal: Normal range of motion.       Thoracic back: Normal.       Lumbar back: Normal.  Neurological: She is alert.  Skin: Skin is warm and dry.  Psychiatric: She has a normal mood and affect.  Nursing note and vitals reviewed.   ED Course  Procedures (including critical care time) Labs Review Labs Reviewed  URINALYSIS, ROUTINE W REFLEX MICROSCOPIC (NOT AT [facility]) - Abnormal; Notable for the following:    pH 8.5 (*)    Hgb urine dipstick SMALL (*)    Protein, ur 30 (*)    Leukocytes, UA TRACE (*)    All other components within normal limits  URINE MICROSCOPIC-ADD ON - Abnormal; Notable for the following:    Squamous Epithelial / LPF 0-5 (*)    Bacteria, UA FEW (*)    All other components within normal limits  CBC WITH DIFFERENTIAL/PLATELET - Abnormal; Notable for the following:    Lymphs Abs 4.5 (*)    All other components within normal limits  URINE CULTURE  PREGNANCY, URINE  BASIC METABOLIC PANEL    Patients  labs reviewed.    Results were also discussed with patient.    MDM   Final diagnoses:  UTI (lower urinary tract infection)  Chronic pain    Pt with dysuria and sx suggesting uti, with equivocal UA.  Culture sent.  Pt was placed on keflex, tramadol prescribed, pyridium prescribed.  Pt to f/u with pcp if sx persist or worsen. Will be seeing her chronic pain specialist in 3 days.  The patient appears reasonably screened and/or stabilized for discharge and I doubt any other medical condition or other [facility] requiring further screening, evaluation, or treatment in the ED at this time prior to discharge.       Burgess Amor, PA-C 01/12/15 1332  Eber Hong, MD 01/13/15 [phone]

## 2015-01-12 NOTE — ED Notes (Signed)
Patient states that she needs an antibiotic. States that she has vaginal pain.

## 2015-01-12 NOTE — Discharge Instructions (Signed)
Dysuria Dysuria is pain or discomfort while urinating. The pain or discomfort may be felt in the tube that carries urine out of the bladder (urethra) or in the surrounding tissue of the genitals. The pain may also be felt in the groin area, lower abdomen, and lower back. You may have to urinate frequently or have the sudden feeling that you have to urinate (urgency). Dysuria can affect both men and women, but is more common in women. Dysuria can be caused by many different things, including:  Urinary tract infection in women.  Infection of the kidney or bladder.  Kidney stones or bladder stones.  Certain sexually transmitted infections (STIs), such as chlamydia.  Dehydration.  Inflammation of the vagina.  Use of certain medicines.  Use of certain soaps or scented products that cause irritation. HOME CARE INSTRUCTIONS Watch your dysuria for any changes. The following actions may help to reduce any discomfort you are feeling:  Drink enough fluid to keep your urine clear or pale yellow.  Empty your bladder often. Avoid holding urine for long periods of time.  After a bowel movement or urination, women should cleanse from front to back, using each tissue only once.  Empty your bladder after sexual intercourse.  Take medicines only as directed by your health care provider.  If you were prescribed an antibiotic medicine, finish it all even if you start to feel better.  Avoid caffeine, tea, and alcohol. They can irritate the bladder and make dysuria worse. In men, alcohol may irritate the prostate.  Keep all follow-up visits as directed by your health care provider. This is important.  If you had any tests done to find the cause of dysuria, it is your responsibility to obtain your test results. Ask the lab or department performing the test when and how you will get your results. Talk with your health care provider if you have any questions about your results. SEEK MEDICAL CARE  IF:  You develop pain in your back or sides.  You have a fever.  You have nausea or vomiting.  You have blood in your urine.  You are not urinating as often as you usually do. SEEK IMMEDIATE MEDICAL CARE IF:  You pain is severe and not relieved with medicines.  You are unable to hold down any fluids.  You or someone else notices a change in your mental function.  You have a rapid heartbeat at rest.  You have shaking or chills.  You feel extremely weak.   This information is not intended to replace advice given to you by your health care provider. Make sure you discuss any questions you have with your health care provider.   Document Released: 10/02/2003 Document Revised: 01/24/2014 Document Reviewed: 08/29/2013 Elsevier Interactive Patient Education 2016 [employer]  Chronic Pain Chronic pain can be defined as pain that is off and on and lasts for 3-6 months or longer. Many things cause chronic pain, which can make it difficult to make a diagnosis. There are many treatment options available for chronic pain. However, finding a treatment that works well for you may require trying various approaches until the right one is found. Many people benefit from a combination of two or more types of treatment to control their pain. SYMPTOMS  Chronic pain can occur anywhere in the body and can range from mild to very severe. Some types of chronic pain include:  Headache.  Low back pain.  Cancer pain.  Arthritis pain.  Neurogenic pain. This is pain  resulting from damage to nerves. People with chronic pain may also have other symptoms such as:  Depression.  Anger.  Insomnia.  Anxiety. DIAGNOSIS  Your health care provider will help diagnose your condition over time. In many cases, the initial focus will be on excluding possible conditions that could be causing the pain. Depending on your symptoms, your health care provider may order tests to diagnose your condition. Some of  these tests may include:   Blood tests.   CT scan.   MRI.   X-rays.   Ultrasounds.   Nerve conduction studies.  You may need to see a specialist.  TREATMENT  Finding treatment that works well may take time. You may be referred to a pain specialist. He or she may prescribe medicine or therapies, such as:   Mindful meditation or yoga.  Shots (injections) of numbing or pain-relieving medicines into the spine or area of pain.  Local electrical stimulation.  Acupuncture.   Massage therapy.   Aroma, color, light, or sound therapy.   Biofeedback.   Working with a physical therapist to keep from getting stiff.   Regular, gentle exercise.   Cognitive or behavioral therapy.   Group support.  Sometimes, surgery may be recommended.  HOME CARE INSTRUCTIONS   Take all medicines as directed by your health care provider.   Lessen stress in your life by relaxing and doing things such as listening to calming music.   Exercise or be active as directed by your health care provider.   Eat a healthy diet and include things such as vegetables, fruits, fish, and lean meats in your diet.   Keep all follow-up appointments with your health care provider.   Attend a support group with others suffering from chronic pain. SEEK MEDICAL CARE IF:   Your pain gets worse.   You develop a new pain that was not there before.   You cannot tolerate medicines given to you by your health care provider.   You have new symptoms since your last visit with your health care provider.  SEEK IMMEDIATE MEDICAL CARE IF:   You feel weak.   You have decreased sensation or numbness.   You lose control of bowel or bladder function.   Your pain suddenly gets much worse.   You develop shaking.  You develop chills.  You develop confusion.  You develop chest pain.  You develop shortness of breath.  MAKE SURE YOU:  Understand these instructions.  Will watch your  condition.  Will get help right away if you are not doing well or get worse.   This information is not intended to replace advice given to you by your health care provider. Make sure you discuss any questions you have with your health care provider.   Document Released: 09/25/2001 Document Revised: 09/05/2012 Document Reviewed: 06/29/2012 Elsevier Interactive Patient Education [employer]

## 2015-01-13 ENCOUNTER — Ambulatory Visit (INDEPENDENT_AMBULATORY_CARE_PROVIDER_SITE_OTHER): Payer: Medicaid Other | Admitting: Diagnostic Neuroimaging

## 2015-01-13 ENCOUNTER — Encounter: Payer: Self-pay | Admitting: Diagnostic Neuroimaging

## 2015-01-13 VITALS — BP 141/92 | HR 94 | Wt 156.0 lb

## 2015-01-13 DIAGNOSIS — R519 Headache, unspecified: Secondary | ICD-10-CM

## 2015-01-13 DIAGNOSIS — G894 Chronic pain syndrome: Secondary | ICD-10-CM

## 2015-01-13 DIAGNOSIS — G43719 Chronic migraine without aura, intractable, without status migrainosus: Secondary | ICD-10-CM | POA: Diagnosis not present

## 2015-01-13 DIAGNOSIS — R51 Headache: Secondary | ICD-10-CM

## 2015-01-13 MED ORDER — AMITRIPTYLINE HCL 100 MG PO TABS
100.0000 mg | ORAL_TABLET | Freq: Every day | ORAL | Status: DC
Start: 2015-01-13 — End: 2015-04-23

## 2015-01-13 NOTE — Progress Notes (Signed)
[facility]  PATIENT: Tanya Krause DOB: January 12, 1986  REFERRING CLINICIAN: Muse  HISTORY FROM: patient  REASON FOR VISIT: follow up   HISTORICAL  CHIEF COMPLAINT:  Chief Complaint  Patient presents with  . Migraine    rm 6, "migraines, pain from neck down back to buttocks"  . Follow-up    2 month, "Out of most medications"    HISTORY OF PRESENT ILLNESS:   UPDATE 01/13/15: Since last visit, continues with severe headaches. Only on gabapentin, amitriptyline, cephalexin and folic acid. Still with depression, anxiety, chronic pain.  UPDATE 11/17/14: Since last visit had moved to [facility] for a job. Over summer 2016, was having more headache, fibromyalgia, numbness, weakness, and multiple ER visits in [place]. Then came back to [facility] in August 2016. Sept 2016 went to ER, then admitted. Found to have low B12 level (91). Had poor PO intake due to nausea and vomiting. Now using a walker. Now living with her parents.   Migraine headaches --> global headache, throbbing and beating pain, neck pain, shoulder, nausea/vomiting, photophobia/phonophobia. 15-20 days per month. On other days, milder dull headaches.  UPDATE 03/08/12: Since last visit, was doing well until 3 weeks ago, then developed increasing HA (similar type, but more severe intensity) with left neck pain. Zomig not helping. Went to ER as well.  UPDATE 12/06/11: She continues to have headaches daily, more on left side of head, radiating to neck and shoulders.  She was taking Atenolol for blood pressure during pregnancy but has not taken x 1 year.  She is tolerating Amitriptylline 50mg  at hs without relief.  Also taking phenergan 25mg  without relief.  She is also taking Advil PM 1 1/2 tablet daily.  Not sleeping averaging 2-3 hours per night.  Denies increase stress.  She did have relief after last visit and depacon infusion.  Now has a headache 10/10.  She has not been seen by opthamolology.  She reports she has also lost a  significant amount of weight in the last month.  Since her last visit she has lost 19 pounds. She has had a tubal ligation.    UPDATE 07/04/11:  Continues to have persistent headaches 4-5 times per day, 3-4 days per week.  Amitriptylline 25mg  has helped but she is currently out of her prescription.  Headache is frontal, throbbing with nausea, photophobia and phonophobia.  She has had multiple ER visits.  She has tried sumitriptan without relief, Fiorcet helped minimally.     PRIOR HPI (11/23/10):  29 year old right-handed female with history of hypertension, here for evaluation of intractable migraine headaches. Patient reports history of headaches since age 1 years old. She describes left-sided, throbbing, severe headaches associated with nausea, vomiting, photophobia and phonophobia. Now she is having 3-5 days of headache per week. She's had multiple visits to the emergency room for headache relief. Now she also feels left-hand weakness with some of her headaches. Strong smells, light and loud noises tend to trigger her headaches. She has tried Topamax, hydrocodone, Fioricet, Maxalt, sumatriptan, Tylenol without relief. Toradol injections provide some relief.    REVIEW OF SYSTEMS: Full 14 system review of systems performed and notable only for weight loss swelling in legs shortness of breath snoring joint pain cramps constipation headache numbness weakness depression not enough sleep decr energy change in appetite.    ALLERGIES: Allergies  Allergen Reactions  . Tomato Shortness Of Breath, Swelling and Rash  . Aspirin Hives  . Doxycycline Nausea And Vomiting  . Hydromorphone Hcl Itching  Patient can take but has to have benadryl with it (Dilaudid)  . Ibuprofen Hives  . Metoclopramide Itching and Other (See Comments)    "makes me feel jittery"  . Other Hives    Mayonaise  . Shellfish Allergy Hives  . Toradol [Ketorolac Tromethamine] Hives  . Latex Itching and Rash    Vaginal Only     HOME MEDICATIONS: Outpatient Prescriptions Prior to Visit  Medication Sig Dispense Refill  . amitriptyline (ELAVIL) 50 MG tablet Take 1 tablet (50 mg total) by mouth at bedtime. 30 tablet 6  . cephALEXin (KEFLEX) 500 MG capsule Take 1 capsule (500 mg total) by mouth 4 (four) times daily. 28 capsule 0  . folic acid (FOLVITE) 1 MG tablet Take 1 tablet (1 mg total) by mouth daily. 30 tablet 2  . gabapentin (NEURONTIN) 300 MG capsule Take 1 capsule (300 mg total) by mouth 3 (three) times daily. (Patient taking differently: Take 900 mg by mouth 3 (three) times daily. ) 90 capsule 1  . albuterol (PROVENTIL HFA;VENTOLIN HFA) 108 (90 BASE) MCG/ACT inhaler Inhale 2 puffs into the lungs every 6 (six) hours as needed for wheezing or shortness of breath. Reported on 01/13/2015    . atenolol (TENORMIN) 50 MG tablet Take 50 mg by mouth daily. Reported on 01/13/2015  1  . acetaminophen-codeine (TYLENOL #3) 300-30 MG tablet Take 1 tablet by mouth every 12 (twelve) hours as needed for moderate pain. (Patient not taking: Reported on 01/04/2015) 60 tablet 0  . butalbital-acetaminophen-caffeine (FIORICET, ESGIC) 50-325-40 MG tablet Take 2 tablets by mouth every 6 (six) hours as needed for headache. (Patient not taking: Reported on 01/13/2015) 30 tablet 0  . cyanocobalamin 1000 MCG tablet Take 1 tablet (1,000 mcg total) by mouth daily. (Patient not taking: Reported on 01/13/2015) 30 tablet 3  . cyclobenzaprine (FLEXERIL) 10 MG tablet Take 1 tablet (10 mg total) by mouth 3 (three) times daily as needed for muscle spasms. (Patient not taking: Reported on 01/04/2015) 90 tablet 1  . cyclobenzaprine (FLEXERIL) 10 MG tablet Take 1 tablet (10 mg total) by mouth 2 (two) times daily as needed for muscle spasms. (Patient not taking: Reported on 01/13/2015) 20 tablet 0  . diphenhydrAMINE (BENADRYL) 25 mg capsule Take 1 capsule (25 mg total) by mouth every 6 (six) hours as needed for itching. Also available OTC (Patient not  taking: Reported on 01/13/2015) 60 capsule 0  . feeding supplement, ENSURE ENLIVE, (ENSURE ENLIVE) LIQD Take 237 mLs by mouth 3 (three) times daily between meals. (Patient not taking: Reported on 01/13/2015) 30 Bottle 1  . FLUoxetine (PROZAC) 20 MG capsule Take 1 capsule (20 mg total) by mouth daily. (Patient not taking: Reported on 01/04/2015) 30 capsule 2  . magnesium oxide (MAG-OX) 400 (241.3 MG) MG tablet Take 1 tablet (400 mg total) by mouth 2 (two) times daily. (Patient not taking: Reported on 01/13/2015) 60 tablet 1  . nicotine (NICODERM CQ - DOSED IN MG/24 HOURS) 21 mg/24hr patch Place 1 patch (21 mg total) onto the skin daily. (Patient not taking: Reported on 01/13/2015) 28 patch 1  . nystatin (MYCOSTATIN) 100000 UNIT/ML suspension Take 5 mLs (500,000 Units total) by mouth 4 (four) times daily. (Patient not taking: Reported on 01/13/2015) 240 mL 0  . oxyCODONE (OXY IR/ROXICODONE) 5 MG immediate release tablet Take 1 tablet (5 mg total) by mouth every 4 (four) hours as needed for severe pain or breakthrough pain. (Patient not taking: Reported on 01/04/2015) 30 tablet 0  . oxyCODONE-acetaminophen (PERCOCET/ROXICET)  5-325 MG tablet Take 1 tablet by mouth every 6 (six) hours as needed. (Patient not taking: Reported on 01/04/2015) 6 tablet 0  . pantoprazole (PROTONIX) 40 MG tablet Take 1 tablet (40 mg total) by mouth daily. (Patient not taking: Reported on 01/13/2015) 30 tablet 1  . polyethylene glycol (MIRALAX) packet Take 17 g by mouth daily. (Patient not taking: Reported on 01/13/2015) 30 each 3  . promethazine (PHENERGAN) 25 MG tablet Take 1 tablet (25 mg total) by mouth every 6 (six) hours as needed for nausea or vomiting. (Patient not taking: Reported on 01/04/2015) 30 tablet 0  . promethazine (PHENERGAN) 25 MG tablet Take 1 tablet (25 mg total) by mouth every 6 (six) hours as needed. (Patient not taking: Reported on 01/04/2015) 12 tablet 1  . promethazine (PHENERGAN) 6.25 MG/5ML syrup Take 20  mLs (25 mg total) by mouth every 6 (six) hours as needed for nausea or vomiting. (Patient not taking: Reported on 01/13/2015) 120 mL 0  . traMADol (ULTRAM) 50 MG tablet Take 1 tablet (50 mg total) by mouth every 6 (six) hours as needed. (Patient not taking: Reported on 01/13/2015) 12 tablet 0  . UNABLE TO FIND Outpatient physical therapy  Diagnosis: lower extremity weakness, B12 deficiency (Patient not taking: Reported on 01/13/2015) 1 Mutually Defined 0   No facility-administered medications prior to visit.    PAST MEDICAL HISTORY: Past Medical History  Diagnosis Date  . Hypertension   . Asthma   . Pelvic inflammatory disease (PID)   . Sickle cell disease (HCC)   . Fibromyalgia   . Chronic abdominal pain   . Migraine     PAST SURGICAL HISTORY: Past Surgical History  Procedure Laterality Date  . Tubal ligation    . Kidney infections    . Wisdom tooth extraction      FAMILY HISTORY: Family History  Problem Relation Age of Onset  . Hypertension Mother   . Diabetes Mother   . Heart failure Father     SOCIAL HISTORY:  Social History   Social History  . Marital Status: Divorced    Spouse Name: N/A  . Number of Children: 4  . Years of Education: 12   Occupational History  .      unemployed   Social History Main Topics  . Smoking status: Light Tobacco Smoker    Types: Cigarettes  . Smokeless tobacco: Never Used     Comment: Occasional smoker  . Alcohol Use: Yes     Comment: occasionally-once a month  . Drug Use: No  . Sexual Activity: Yes    Birth Control/ Protection: Surgical   Other Topics Concern  . Not on file   Social History Narrative   Lives with parents, children   Caffeine use- coffee once a day, sodas x 2 a day     PHYSICAL EXAM  GENERAL EXAM/CONSTITUTIONAL: Vitals:  Filed Vitals:   01/13/15 1343  BP: 141/92  Pulse: 94  Weight: 156 lb (70.761 kg)   Body mass index is 30.47 kg/(m^2). No exam data present  Patient is in no distress;  well developed, nourished and groomed; neck is supple  SEVERE WEAK, MALAISE APPEARANCE  SITTING IN DARK ROOM  CARDIOVASCULAR:  Examination of carotid arteries is normal; no carotid bruits  Regular rate and rhythm, no murmurs  Examination of peripheral vascular system by observation and palpation is normal  EYES:  Ophthalmoscopic exam of optic discs and posterior segments is normal; no papilledema or hemorrhages  MUSCULOSKELETAL:  Gait,  strength, tone, movements noted in Neurologic exam below  NEUROLOGIC: MENTAL STATUS:  No flowsheet data found.  awake, alert, oriented to person, place and time  recent and remote memory intact  normal attention and concentration  language fluent, comprehension intact, naming intact,   fund of knowledge appropriate  CRANIAL NERVE:   2nd - no papilledema on fundoscopic exam  2nd, 3rd, 4th, 6th - pupils equal and reactive to light, visual fields full to confrontation, extraocular muscles intact, no nystagmus; SUBJECTIVE BLURRED VISION IN ALL DIRECTIONS OF EYE MOVEMENTS  5th - facial sensation symmetric  7th - facial strength symmetric  8th - hearing intact  9th - palate elevates symmetrically, uvula midline  11th - shoulder shrug symmetric  12th - tongue protrusion midline  MOTOR:   normal bulk and tone; BUE 3; BLE 2-3  SENSORY:   normal and symmetric to light touch  COORDINATION:   finger-nose-finger, fine finger movements --> SLOW  REFLEXES:   deep tendon reflexes; RUE 3, LUE 2; RLE 2; LLE 1  GAIT/STATION:   narrow based gait; USING ROLLATOR WALKER    DIAGNOSTIC DATA (LABS, IMAGING, TESTING) - I reviewed patient records, labs, notes, testing and imaging myself where available.  Lab Results  Component Value Date   WBC 10.1 01/11/2015   HGB 12.9 01/11/2015   HCT 37.2 01/11/2015   MCV 82.1 01/11/2015   PLT 213 01/11/2015      Component Value Date/Time   NA 138 01/11/2015 2337   K 4.6 01/11/2015 2337    CL 109 01/11/2015 2337   CO2 24 01/11/2015 2337   GLUCOSE 73 01/11/2015 2337   BUN 10 01/11/2015 2337   CREATININE 0.65 01/11/2015 2337   CALCIUM 9.0 01/11/2015 2337   PROT 7.1 01/04/2015 1645   ALBUMIN 3.7 01/04/2015 1645   AST 23 01/04/2015 1645   ALT 13* 01/04/2015 1645   ALKPHOS 54 01/04/2015 1645   BILITOT 0.3 01/04/2015 1645   GFRNONAA >60 01/11/2015 2337   GFRAA >60 01/11/2015 2337   No results found for: CHOL, HDL, LDLCALC, LDLDIRECT, TRIG, CHOLHDL No results found for: VPXT0G Lab Results  Component Value Date   VITAMINB12 205 01/04/2015   Lab Results  Component Value Date   TSH 0.718 10/09/2014    10/12/14 MRI thoracic / lumbar  - Normal noncontrast MRI appearance of the thoracic and lumbar spine.    ASSESSMENT AND PLAN  29 y.o. year old female here with intractable headaches, migraine, pain, insomnia. Has failed multiple medications (topamax, hydrocodone, fioricet, maxalt, sumatriptan, tylenol, amitriptyline). Also with superimposed depression, anxiety, fibromyalgia. Unfortunately, I do not have much more to offer this patient after 4 years of treatment. I would refer patient back to PCP, and see if patient can be referred to another headache specialist.   Dx:  Intractable headache, unspecified chronicity pattern, unspecified headache type  Intractable chronic migraine without aura and without status migrainosus  Chronic pain syndrome    PLAN: - increase amitriptyline to 100mg  at bedtime - pain mgmt clinic for fibromyalgia - ask PCP to refer to another headache clinic for second opinion  Meds ordered this encounter  Medications  . amitriptyline (ELAVIL) 100 MG tablet    Sig: Take 1 tablet (100 mg total) by mouth at bedtime.    Dispense:  30 tablet    Refill:  6   No Follow-up on file.    Suanne Marker, MD 01/13/2015, 2:25 PM Certified in Neurology, Neurophysiology and Neuroimaging  [facility] [address]  [address], [place], [place] [address] [phone]

## 2015-01-13 NOTE — Patient Instructions (Signed)
Increase amitriptyline to 100 mg at bedtime 

## 2015-01-14 ENCOUNTER — Emergency Department (HOSPITAL_COMMUNITY)
Admission: EM | Admit: 2015-01-14 | Discharge: 2015-01-14 | Disposition: A | Discharge status: Home / Self Care | Payer: Medicaid Other | Attending: Emergency Medicine | Admitting: Emergency Medicine

## 2015-01-14 ENCOUNTER — Encounter (HOSPITAL_COMMUNITY): Payer: Self-pay | Admitting: Emergency Medicine

## 2015-01-14 DIAGNOSIS — M79605 Pain in left leg: Secondary | ICD-10-CM | POA: Insufficient documentation

## 2015-01-14 DIAGNOSIS — Z8742 Personal history of other diseases of the female genital tract: Secondary | ICD-10-CM | POA: Diagnosis not present

## 2015-01-14 DIAGNOSIS — M79604 Pain in right leg: Secondary | ICD-10-CM | POA: Insufficient documentation

## 2015-01-14 DIAGNOSIS — J45909 Unspecified asthma, uncomplicated: Secondary | ICD-10-CM | POA: Insufficient documentation

## 2015-01-14 DIAGNOSIS — Z792 Long term (current) use of antibiotics: Secondary | ICD-10-CM | POA: Insufficient documentation

## 2015-01-14 DIAGNOSIS — M79601 Pain in right arm: Secondary | ICD-10-CM | POA: Diagnosis not present

## 2015-01-14 DIAGNOSIS — G43909 Migraine, unspecified, not intractable, without status migrainosus: Secondary | ICD-10-CM | POA: Diagnosis not present

## 2015-01-14 DIAGNOSIS — M79602 Pain in left arm: Secondary | ICD-10-CM | POA: Diagnosis not present

## 2015-01-14 DIAGNOSIS — Z91040 Latex allergy status: Secondary | ICD-10-CM | POA: Diagnosis not present

## 2015-01-14 DIAGNOSIS — R110 Nausea: Secondary | ICD-10-CM | POA: Insufficient documentation

## 2015-01-14 DIAGNOSIS — G894 Chronic pain syndrome: Secondary | ICD-10-CM | POA: Diagnosis not present

## 2015-01-14 DIAGNOSIS — F17210 Nicotine dependence, cigarettes, uncomplicated: Secondary | ICD-10-CM | POA: Diagnosis not present

## 2015-01-14 DIAGNOSIS — D571 Sickle-cell disease without crisis: Secondary | ICD-10-CM | POA: Diagnosis not present

## 2015-01-14 DIAGNOSIS — I10 Essential (primary) hypertension: Secondary | ICD-10-CM | POA: Insufficient documentation

## 2015-01-14 DIAGNOSIS — M549 Dorsalgia, unspecified: Secondary | ICD-10-CM | POA: Insufficient documentation

## 2015-01-14 DIAGNOSIS — Z79899 Other long term (current) drug therapy: Secondary | ICD-10-CM | POA: Insufficient documentation

## 2015-01-14 DIAGNOSIS — M797 Fibromyalgia: Secondary | ICD-10-CM | POA: Diagnosis not present

## 2015-01-14 LAB — URINE CULTURE
Culture: >=100000
Special Requests: NORMAL

## 2015-01-14 MED ORDER — OXYCODONE-ACETAMINOPHEN 5-325 MG PO TABS: 1.0000 | ORAL_TABLET | ORAL | Status: DC | PRN

## 2015-01-14 MED ORDER — FENTANYL CITRATE (PF) 100 MCG/2ML IJ SOLN
50.0000 ug | Freq: Once | INTRAMUSCULAR | Status: AC
Start: 2015-01-14 — End: 2015-01-14
  Administered 2015-01-14: 50 ug via INTRAVENOUS
  Filled 2015-01-14: qty 2

## 2015-01-14 MED ORDER — PROMETHAZINE HCL 25 MG/ML IJ SOLN
12.5000 mg | Freq: Once | INTRAMUSCULAR | Status: AC
  Administered 2015-01-14: 12.5 mg via INTRAVENOUS
  Filled 2015-01-14: qty 1

## 2015-01-14 MED ORDER — MORPHINE SULFATE (PF) 4 MG/ML IV SOLN
4.0000 mg | Freq: Once | INTRAVENOUS | Status: AC
  Administered 2015-01-14: 4 mg via INTRAVENOUS
  Filled 2015-01-14: qty 1

## 2015-01-14 MED ORDER — DIPHENHYDRAMINE HCL 50 MG/ML IJ SOLN
25.0000 mg | Freq: Once | INTRAMUSCULAR | Status: AC
  Administered 2015-01-14: 25 mg via INTRAVENOUS
  Filled 2015-01-14: qty 1

## 2015-01-14 MED ORDER — DEXAMETHASONE SODIUM PHOSPHATE 4 MG/ML IJ SOLN
4.0000 mg | Freq: Once | INTRAMUSCULAR | Status: AC
  Administered 2015-01-14: 4 mg via INTRAVENOUS
  Filled 2015-01-14: qty 1

## 2015-01-14 MED ORDER — SODIUM CHLORIDE 0.9 % IV BOLUS (SEPSIS)
1000.0000 mL | Freq: Once | INTRAVENOUS | Status: AC
  Administered 2015-01-14: 1000 mL via INTRAVENOUS

## 2015-01-14 NOTE — Discharge Instructions (Signed)
Small prescription for pain medication. You'll need to follow-up with your primary care doctor.

## 2015-01-14 NOTE — ED Notes (Signed)
POC discussed with patient. Importance of primary care for chronic concerns emphasized.

## 2015-01-14 NOTE — ED Notes (Addendum)
Pt comes to Ed after visiting the [facility] this morning 01/14/15, was told to go to the Ed for help. Pt has a hx fibromyalgia and rates her pain 10 out 10. Pt has been taking 500 mg OTC for pain along with Flexall with no relief.  Pt reports unable to eat, sleep, and move in this pain, has nausea, and vomiting this morning.

## 2015-01-14 NOTE — ED Provider Notes (Signed)
CSN: [id number]     Arrival date & time 01/14/15  1001 History   First MD Initiated Contact with Patient 01/14/15 1220     Chief Complaint  Patient presents with  . Back Pain  . Leg Pain  . Leg Swelling  . Nausea     (Consider location/radiation/quality/duration/timing/severity/associated sxs/prior Treatment) HPI..... Patient has well-documented chronic pain issues. Today she complains of pain in her arms and legs. She apparently was seen today at a pain clinic" and was told to go to the emergency department for further pain management. She also has a history of fibromyalgia. No fever, sweats, chills, chest pain, dyspnea, dysuria  Past Medical History  Diagnosis Date  . Hypertension   . Asthma   . Pelvic inflammatory disease (PID)   . Sickle cell disease (HCC)   . Fibromyalgia   . Chronic abdominal pain   . Migraine    Past Surgical History  Procedure Laterality Date  . Tubal ligation    . Kidney infections    . Wisdom tooth extraction     Family History  Problem Relation Age of Onset  . Hypertension Mother   . Diabetes Mother   . Heart failure Father    Social History  Substance Use Topics  . Smoking status: Light Tobacco Smoker    Types: Cigarettes  . Smokeless tobacco: Never Used     Comment: Occasional smoker  . Alcohol Use: Yes     Comment: occasionally-once a month   OB History    Gravida Para Term Preterm AB TAB SAB Ectopic Multiple Living   4 4 1 3      4      Review of Systems  All other systems reviewed and are negative.     Allergies  Tomato; Aspirin; Doxycycline; Hydromorphone hcl; Ibuprofen; Metoclopramide; Other; Shellfish allergy; Toradol; Latex; and Tramadol  Home Medications   Prior to Admission medications   Medication Sig Start Date End Date Taking? Authorizing Provider  albuterol (PROVENTIL HFA;VENTOLIN HFA) 108 (90 BASE) MCG/ACT inhaler Inhale 2 puffs into the lungs every 6 (six) hours as needed for wheezing or shortness of breath.  Reported on 01/13/2015 01/14/12  Yes 01/16/12, MD  amitriptyline (ELAVIL) 100 MG tablet Take 1 tablet (100 mg total) by mouth at bedtime. 01/13/15  Yes 01/15/15, MD  atenolol (TENORMIN) 50 MG tablet Take 50 mg by mouth daily. Reported on 01/13/2015 12/18/14  Yes Historical Provider, MD  butalbital-acetaminophen-caffeine (FIORICET, ESGIC) 50-325-40 MG tablet Take 1-2 tablets by mouth every 6 (six) hours as needed. headache 10/17/14  Yes Historical Provider, MD  cephALEXin (KEFLEX) 500 MG capsule Take 1 capsule (500 mg total) by mouth 4 (four) times daily. 01/12/15  Yes 01/14/15, PA-C  folic acid (FOLVITE) 1 MG tablet Take 1 tablet (1 mg total) by mouth daily. 10/17/14  Yes 10/19/14, MD  gabapentin (NEURONTIN) 300 MG capsule Take 1 capsule (300 mg total) by mouth 3 (three) times daily. Patient taking differently: Take 900 mg by mouth 3 (three) times daily.  11/18/14  Yes 13/1/16, MD  pantoprazole (PROTONIX) 40 MG tablet Take 40 mg by mouth daily as needed. pain 11/18/14  Yes Historical Provider, MD  oxyCODONE-acetaminophen (PERCOCET/ROXICET) 5-325 MG tablet Take 1-2 tablets by mouth every 4 (four) hours as needed for severe pain. 01/14/15   01/16/15, MD   BP 127/87 mmHg  Pulse 96  Temp(Src) 98.1 F (36.7 C) (Oral)  Resp 18  SpO2 100%  LMP  01/01/2015 Physical Exam  Constitutional: She is oriented to person, place, and time. She appears well-developed and well-nourished.  HENT:  Head: Normocephalic and atraumatic.  Eyes: Conjunctivae and EOM are normal. Pupils are equal, round, and reactive to light.  Neck: Normal range of motion. Neck supple.  Cardiovascular: Normal rate and regular rhythm.   Pulmonary/Chest: Effort normal and breath sounds normal.  Abdominal: Soft. Bowel sounds are normal.  Musculoskeletal: Normal range of motion.  Neurological: She is alert and oriented to person, place, and time.  Skin:  Minimal soft tissue tenderness arms and legs   Psychiatric: She has a normal mood and affect. Her behavior is normal.  Nursing note and vitals reviewed.   ED Course  Procedures (including critical care time) Labs Review Labs Reviewed - No data to display  Imaging Review No results found. I have personally reviewed and evaluated these images and lab results as part of my medical decision-making.   EKG Interpretation None      MDM   Final diagnoses:  Chronic pain syndrome    Patient given IV fluids, pain medicine, Phenergan, Benadryl IV. She was discharged home with Percocet 5-325 [#10].  She has been encouraged to follow-up with her other physicians.    Donnetta Hutching, MD 01/14/15 [phone]

## 2015-01-14 NOTE — Progress Notes (Addendum)
Confirmed pcp is Dr Ledell Peoples  Pt reports stating she is trying to find another medicaid pcp Pt has called DSS to recommend a medicaid Dr and pt is to call back to 2 recommended doctors after beginning of 2017  [facility] r/t not being able to give her extra medication Pt stating she does NOT have a pain management contract with Dr Genella Mech This will be her second visit to Dr Genella Mech  Has a PCS home nurse coming in for ADLs everyday 7days a week

## 2015-01-14 NOTE — ED Notes (Signed)
Patient was alert, oriented and stable upon discharge. RN went over AVS and patient had no further questions.  

## 2015-01-16 ENCOUNTER — Telehealth (HOSPITAL_COMMUNITY): Payer: Self-pay

## 2015-01-16 NOTE — Telephone Encounter (Signed)
Post ED Visit - Positive Culture Follow-up  Culture report reviewed by antimicrobial stewardship pharmacist:  []  , Pharm.D. []  Enzo Bi, Pharm.D., BCPS [x]  , Pharm.D. []  Celedonio Miyamoto, Pharm.D., BCPS []  [place], Garvin Fila.D., BCPS, AAHIVP []  , Pharm.D., BCPS, AAHIVP []  Georgina Pillion, Pharm.D. []  , [place].D.  Positive urine culture, >/= 100,000 colonies -> E Coli Treated with Cephalexin, organism sensitive to the same and no further patient follow-up is required at this time.  [place] 01/16/2015, 8:45 AM

## 2015-01-19 ENCOUNTER — Telehealth: Payer: Self-pay | Admitting: Neurology

## 2015-01-19 DIAGNOSIS — R112 Nausea with vomiting, unspecified: Secondary | ICD-10-CM

## 2015-01-19 MED ORDER — PROMETHAZINE HCL 25 MG PO TABS: 25.0000 mg | ORAL_TABLET | Freq: Three times a day (TID) | ORAL | Status: DC | PRN

## 2015-01-19 NOTE — Telephone Encounter (Signed)
Patient called c/o N/V since increasing Elavil to 100 mg after recent clinic visit with Dr. Marjory Lies. She had some left over Phenergan, which she took and it helped. Requested Rx for phenergan 25 mg. I advised patient that phenergan is not to be taken every day, but it may help to bridge symptoms as her body gets adjusted to the increase in the amitriptyline. Will Rx 10 pills, no refills.

## 2015-01-23 ENCOUNTER — Emergency Department (HOSPITAL_COMMUNITY)
Admission: EM | Admit: 2015-01-23 | Discharge: 2015-01-23 | Disposition: A | Discharge status: Home / Self Care | Payer: Medicaid Other | Attending: Emergency Medicine | Admitting: Emergency Medicine

## 2015-01-23 ENCOUNTER — Encounter (HOSPITAL_COMMUNITY): Payer: Self-pay | Admitting: Emergency Medicine

## 2015-01-23 ENCOUNTER — Emergency Department (HOSPITAL_COMMUNITY): Payer: Medicaid Other

## 2015-01-23 DIAGNOSIS — F17210 Nicotine dependence, cigarettes, uncomplicated: Secondary | ICD-10-CM | POA: Insufficient documentation

## 2015-01-23 DIAGNOSIS — G43909 Migraine, unspecified, not intractable, without status migrainosus: Secondary | ICD-10-CM | POA: Diagnosis not present

## 2015-01-23 DIAGNOSIS — Z79899 Other long term (current) drug therapy: Secondary | ICD-10-CM | POA: Diagnosis not present

## 2015-01-23 DIAGNOSIS — J45909 Unspecified asthma, uncomplicated: Secondary | ICD-10-CM | POA: Insufficient documentation

## 2015-01-23 DIAGNOSIS — M797 Fibromyalgia: Secondary | ICD-10-CM | POA: Insufficient documentation

## 2015-01-23 DIAGNOSIS — Z8742 Personal history of other diseases of the female genital tract: Secondary | ICD-10-CM | POA: Insufficient documentation

## 2015-01-23 DIAGNOSIS — Z862 Personal history of diseases of the blood and blood-forming organs and certain disorders involving the immune mechanism: Secondary | ICD-10-CM | POA: Insufficient documentation

## 2015-01-23 DIAGNOSIS — R079 Chest pain, unspecified: Secondary | ICD-10-CM | POA: Diagnosis present

## 2015-01-23 DIAGNOSIS — R112 Nausea with vomiting, unspecified: Secondary | ICD-10-CM | POA: Insufficient documentation

## 2015-01-23 DIAGNOSIS — Z91040 Latex allergy status: Secondary | ICD-10-CM | POA: Diagnosis not present

## 2015-01-23 DIAGNOSIS — G894 Chronic pain syndrome: Secondary | ICD-10-CM | POA: Diagnosis not present

## 2015-01-23 DIAGNOSIS — I10 Essential (primary) hypertension: Secondary | ICD-10-CM | POA: Insufficient documentation

## 2015-01-23 LAB — CBC
HEMATOCRIT: 36.8 % (ref 36.0–46.0)
HEMOGLOBIN: 12.7 g/dL (ref 12.0–15.0)
MCH: 28.4 pg (ref 26.0–34.0)
MCHC: 34.5 g/dL (ref 30.0–36.0)
MCV: 82.3 fL (ref 78.0–100.0)
Platelets: 227 10*3/uL (ref 150–400)
RBC: 4.47 MIL/uL (ref 3.87–5.11)
RDW: 13.5 % (ref 11.5–15.5)
WBC: 9.7 10*3/uL (ref 4.0–10.5)

## 2015-01-23 LAB — BASIC METABOLIC PANEL
ANION GAP: 7 (ref 5–15)
BUN: 9 mg/dL (ref 6–20)
CALCIUM: 9 mg/dL (ref 8.9–10.3)
CO2: 24 mmol/L (ref 22–32)
Chloride: 108 mmol/L (ref 101–111)
Creatinine, Ser: 0.64 mg/dL (ref 0.44–1.00)
GFR calc Af Amer: >60 mL/min (ref >60)
GFR calc non Af Amer: >60 mL/min (ref >60)
GLUCOSE: 88 mg/dL (ref 65–99)
Potassium: 4 mmol/L (ref 3.5–5.1)
Sodium: 139 mmol/L (ref 135–145)

## 2015-01-23 LAB — TROPONIN I

## 2015-01-23 MED ORDER — CLONIDINE HCL 0.1 MG PO TABS: 0.1000 mg | ORAL_TABLET | Freq: Three times a day (TID) | ORAL | Status: DC | PRN

## 2015-01-23 MED ORDER — ACETAMINOPHEN 500 MG PO TABS
1000.0000 mg | ORAL_TABLET | Freq: Once | ORAL | Status: AC
  Administered 2015-01-23: 1000 mg via ORAL
  Filled 2015-01-23: qty 2

## 2015-01-23 MED ORDER — ONDANSETRON HCL 4 MG/2ML IJ SOLN
4.0000 mg | Freq: Once | INTRAMUSCULAR | Status: AC
  Administered 2015-01-23: 4 mg via INTRAVENOUS
  Filled 2015-01-23: qty 2

## 2015-01-23 MED ORDER — LORAZEPAM 2 MG/ML IJ SOLN
1.0000 mg | Freq: Once | INTRAMUSCULAR | Status: AC
  Administered 2015-01-23: 1 mg via INTRAVENOUS
  Filled 2015-01-23: qty 1

## 2015-01-23 MED ORDER — ONDANSETRON 8 MG PO TBDP: 8.0000 mg | ORAL_TABLET | Freq: Three times a day (TID) | ORAL | Status: DC | PRN

## 2015-01-23 MED ORDER — SODIUM CHLORIDE 0.9 % IV BOLUS (SEPSIS)
1000.0000 mL | Freq: Once | INTRAVENOUS | Status: AC
  Administered 2015-01-23: 1000 mL via INTRAVENOUS

## 2015-01-23 MED ORDER — CLONIDINE HCL 0.1 MG PO TABS
0.1000 mg | ORAL_TABLET | Freq: Once | ORAL | Status: AC
  Administered 2015-01-23: 0.1 mg via ORAL
  Filled 2015-01-23: qty 1

## 2015-01-23 NOTE — Discharge Instructions (Signed)
Chronic Pain Discharge Instructions  °Emergency care providers appreciate that many patients coming to us are in severe pain and we wish to address their pain in the safest, most responsible manner.  It is important to recognize however, that the proper treatment of chronic pain differs from that of the pain of injuries and acute illnesses.  Our goal is to provide quality, safe, personalized care and we thank you for giving us the opportunity to serve you. °The use of narcotics and related agents for chronic pain syndromes may lead to additional physical and psychological problems.  Nearly as many people die from prescription narcotics each year as die from car crashes.  Additionally, this risk is increased if such prescriptions are obtained from a variety of sources.  Therefore, only your primary care physician or a pain management specialist is able to safely treat such syndromes with narcotic medications long-term.   ° °Documentation revealing such prescriptions have been sought from multiple sources may prohibit us from providing a refill or different narcotic medication.  Your name may be checked first through the [place] Controlled Substances Reporting System.  This database is a record of controlled substance medication prescriptions that the patient has received.  This has been established by Secretary in an effort to eliminate the dangerous, and often life threatening, practice of obtaining multiple prescriptions from different medical providers.  ° °If you have a chronic pain syndrome (i.e. chronic headaches, recurrent back or neck pain, dental pain, abdominal or pelvis pain without a specific diagnosis, or neuropathic pain such as fibromyalgia) or recurrent visits for the same condition without an acute diagnosis, you may be treated with non-narcotics and other non-addictive medicines.  Allergic reactions or negative side effects that may be reported by a patient to such medications will not  typically lead to the use of a narcotic analgesic or other controlled substance as an alternative. °  °Patients managing chronic pain with a personal physician should have provisions in place for breakthrough pain.  If you are in crisis, you should call your physician.  If your physician directs you to the emergency department, please have the doctor call and speak to our attending physician concerning your care. °  °When patients come to the Emergency Department (ED) with acute medical conditions in which the Emergency Department physician feels appropriate to prescribe narcotic or sedating pain medication, the physician will prescribe these in very limited quantities.  The amount of these medications will last only until you can see your primary care physician in his/her office.  Any patient who returns to the ED seeking refills should expect only non-narcotic pain medications.  ° °In the event of an acute medical condition exists and the emergency physician feels it is necessary that the patient be given a narcotic or sedating medication -  a responsible adult driver should be present in the room prior to the medication being given by the nurse. °  °Prescriptions for narcotic or sedating medications that have been lost, stolen or expired will not be refilled in the Emergency Department.   ° °Patients who have chronic pain may receive non-narcotic prescriptions until seen by their primary care physician.  It is every patient’s personal responsibility to maintain active prescriptions with his or her primary care physician or specialist. °

## 2015-01-23 NOTE — ED Notes (Signed)
Pt reports fibromyalgia pain (chronic pain), pt not able to hold any food down, intermittent cp in central chest.  Pt has occasional sob.

## 2015-01-23 NOTE — ED Provider Notes (Signed)
CSN: [id number]     Arrival date & time 01/23/15  1358 History   First MD Initiated Contact with Patient 01/23/15 1617     Chief Complaint  Patient presents with  . Chest Pain      HPI Patient has a long-standing history of fibromyalgia and chronic pain.  She has been treated with Percocet before in the past.  Currently she is being managed by a pain clinic in [facility] and they're not giving her any opiate medications.  They have given her Neurontin which she states is not helping with discomfort or pain.  She presents today complaining of nausea and vomiting and inability keep food and fluids down.  She reports that she does have some Percocet at home for which she's been taking a half tablet in attempt to make it last.  She reports she ran out of all Percocet 2-3 days ago.  Her symptoms began today.  She also reports intermittent central chest discomfort associated with her vomiting.  She denies shortness of breath on my history.  She reports the majority of her pain is a burning sensation in arms and her legs.  She is requesting medication for her pain   Past Medical History  Diagnosis Date  . Hypertension   . Asthma   . Pelvic inflammatory disease (PID)   . Sickle cell disease (HCC)   . Fibromyalgia   . Chronic abdominal pain   . Migraine    Past Surgical History  Procedure Laterality Date  . Tubal ligation    . Kidney infections    . Wisdom tooth extraction     Family History  Problem Relation Age of Onset  . Hypertension Mother   . Diabetes Mother   . Heart failure Father    Social History  Substance Use Topics  . Smoking status: Light Tobacco Smoker    Types: Cigarettes  . Smokeless tobacco: Never Used     Comment: Occasional smoker  . Alcohol Use: Yes     Comment: occasionally-once a month   OB History    Gravida Para Term Preterm AB TAB SAB Ectopic Multiple Living   4 4 1 3      4      Review of Systems  All other systems reviewed and are  negative.     Allergies  Metoclopramide; Tomato; Aspirin; Doxycycline; Hydromorphone hcl; Ibuprofen; Other; Shellfish allergy; Toradol; Latex; and Tramadol  Home Medications   Prior to Admission medications   Medication Sig Start Date End Date Taking? Authorizing Provider  albuterol (PROVENTIL HFA;VENTOLIN HFA) 108 (90 BASE) MCG/ACT inhaler Inhale 2 puffs into the lungs every 6 (six) hours as needed for wheezing or shortness of breath. Reported on 01/13/2015 01/14/12  Yes 01/16/12, MD  amitriptyline (ELAVIL) 100 MG tablet Take 1 tablet (100 mg total) by mouth at bedtime. 01/13/15  Yes 01/15/15, MD  atenolol (TENORMIN) 50 MG tablet Take 50 mg by mouth daily. Reported on 01/13/2015 12/18/14  Yes Historical Provider, MD  butalbital-acetaminophen-caffeine (FIORICET, ESGIC) 50-325-40 MG tablet Take 1-2 tablets by mouth every 6 (six) hours as needed. headache 10/17/14  Yes Historical Provider, MD  folic acid (FOLVITE) 1 MG tablet Take 1 tablet (1 mg total) by mouth daily. 10/17/14  Yes 10/19/14, MD  gabapentin (NEURONTIN) 300 MG capsule Take 1 capsule (300 mg total) by mouth 3 (three) times daily. Patient taking differently: Take 900 mg by mouth 3 (three) times daily.  11/18/14  Yes Enobong  Venetia Night, MD  oxyCODONE-acetaminophen (PERCOCET/ROXICET) 5-325 MG tablet Take 1-2 tablets by mouth every 4 (four) hours as needed for severe pain. Patient taking differently: Take 0.5 tablets by mouth every 4 (four) hours as needed for severe pain.  01/14/15  Yes Donnetta Hutching, MD  pantoprazole (PROTONIX) 40 MG tablet Take 40 mg by mouth daily as needed. pain 11/18/14  Yes Historical Provider, MD  promethazine (PHENERGAN) 25 MG tablet Take 1 tablet (25 mg total) by mouth every 8 (eight) hours as needed for nausea or vomiting. 01/19/15  Yes Huston Foley, MD  cloNIDine (CATAPRES) 0.1 MG tablet Take 1 tablet (0.1 mg total) by mouth every 8 (eight) hours as needed (muscle aches, withdrawl symptoms). 01/23/15    Azalia Bilis, MD  ondansetron (ZOFRAN ODT) 8 MG disintegrating tablet Take 1 tablet (8 mg total) by mouth every 8 (eight) hours as needed for nausea or vomiting. 01/23/15   Azalia Bilis, MD   BP 116/98 mmHg  Pulse 111  Temp(Src) 97.5 F (36.4 C) (Oral)  Resp 16  Ht 5' (1.524 m)  Wt 162 lb (73.483 kg)  BMI 31.64 kg/m2  SpO2 100%  LMP 01/01/2015 Physical Exam  Constitutional: She is oriented to person, place, and time. She appears well-developed and well-nourished. No distress.  HENT:  Head: Normocephalic and atraumatic.  Eyes: EOM are normal.  Neck: Normal range of motion.  Cardiovascular: Normal rate, regular rhythm and normal heart sounds.   Pulmonary/Chest: Effort normal and breath sounds normal.  Abdominal: Soft. She exhibits no distension. There is no tenderness.  Musculoskeletal: Normal range of motion.  Neurological: She is alert and oriented to person, place, and time.  Skin: Skin is warm and dry.  Psychiatric: She has a normal mood and affect. Judgment normal.  Nursing note and vitals reviewed.   ED Course  Procedures (including critical care time) Labs Review Labs Reviewed  BASIC METABOLIC PANEL  CBC  TROPONIN I    Imaging Review Dg Chest 2 View  01/23/2015  CLINICAL DATA:  Chest pain and short of breath for 45 minutes EXAM: CHEST  2 VIEW COMPARISON:  12/05/2014 FINDINGS: The heart size and mediastinal contours are within normal limits. Both lungs are clear. The visualized skeletal structures are unremarkable. IMPRESSION: No active cardiopulmonary disease. Electronically Signed   By: Jolaine Click M.D.   On: 01/23/2015 14:39   I have personally reviewed and evaluated these images and lab results as part of my medical decision-making.   EKG Interpretation   Date/Time:  Friday January 23 2015 14:07:00 EST Ventricular Rate:  121 PR Interval:  136 QRS Duration: 82 QT Interval:  342 QTC Calculation: 485 R Axis:   76 Text Interpretation:  Sinus tachycardia  Otherwise normal ECG No  significant change was found Confirmed by Vernestine Brodhead  MD, Caryn Bee ([id number]) on  01/23/2015 2:10:22 PM      MDM   Final diagnoses:  Nausea and vomiting, vomiting of unspecified type  Fibromyalgia  Chronic pain syndrome    Patient with exacerbation of chronic pain.  Some of her nausea vomiting may be narcotic withdrawal.  Patient be treated for narcotic withdrawal.  She was treated in the emergency department with IV nausea medicine as well as IV fluids.  She was hydrated with IV fluids.  She still having some discomfort and pain at this time.  I recommended that she call her pain clinic for further evaluation and recommendations regarding her recurrent pain.  She does understand that she will not be prescribed any  of your medication for discharge from the emergency department.  I will send her home with nausea medicine and clonidine    Azalia Bilis, MD 01/23/15 1742

## 2015-02-10 DIAGNOSIS — Z0271 Encounter for disability determination: Secondary | ICD-10-CM

## 2015-02-18 ENCOUNTER — Emergency Department (HOSPITAL_COMMUNITY): Payer: Medicaid Other

## 2015-02-18 ENCOUNTER — Emergency Department (HOSPITAL_COMMUNITY)
Admission: EM | Admit: 2015-02-18 | Discharge: 2015-02-18 | Discharge status: Against Medical Advice | Payer: Medicaid Other | Attending: Emergency Medicine | Admitting: Emergency Medicine

## 2015-02-18 ENCOUNTER — Encounter (HOSPITAL_COMMUNITY): Payer: Self-pay | Admitting: *Deleted

## 2015-02-18 DIAGNOSIS — D571 Sickle-cell disease without crisis: Secondary | ICD-10-CM | POA: Diagnosis not present

## 2015-02-18 DIAGNOSIS — F17210 Nicotine dependence, cigarettes, uncomplicated: Secondary | ICD-10-CM | POA: Diagnosis not present

## 2015-02-18 DIAGNOSIS — G8929 Other chronic pain: Secondary | ICD-10-CM | POA: Diagnosis not present

## 2015-02-18 DIAGNOSIS — M797 Fibromyalgia: Secondary | ICD-10-CM | POA: Insufficient documentation

## 2015-02-18 DIAGNOSIS — Z91040 Latex allergy status: Secondary | ICD-10-CM | POA: Diagnosis not present

## 2015-02-18 DIAGNOSIS — G43909 Migraine, unspecified, not intractable, without status migrainosus: Secondary | ICD-10-CM | POA: Diagnosis not present

## 2015-02-18 DIAGNOSIS — R112 Nausea with vomiting, unspecified: Secondary | ICD-10-CM | POA: Insufficient documentation

## 2015-02-18 DIAGNOSIS — I10 Essential (primary) hypertension: Secondary | ICD-10-CM | POA: Insufficient documentation

## 2015-02-18 DIAGNOSIS — Z3202 Encounter for pregnancy test, result negative: Secondary | ICD-10-CM | POA: Insufficient documentation

## 2015-02-18 DIAGNOSIS — R0789 Other chest pain: Secondary | ICD-10-CM | POA: Insufficient documentation

## 2015-02-18 DIAGNOSIS — Z8742 Personal history of other diseases of the female genital tract: Secondary | ICD-10-CM | POA: Diagnosis not present

## 2015-02-18 DIAGNOSIS — Z79899 Other long term (current) drug therapy: Secondary | ICD-10-CM | POA: Insufficient documentation

## 2015-02-18 DIAGNOSIS — J45909 Unspecified asthma, uncomplicated: Secondary | ICD-10-CM | POA: Insufficient documentation

## 2015-02-18 DIAGNOSIS — R079 Chest pain, unspecified: Secondary | ICD-10-CM | POA: Diagnosis present

## 2015-02-18 LAB — RAPID URINE DRUG SCREEN, HOSP PERFORMED
Amphetamines: NOT DETECTED
BARBITURATES: NOT DETECTED
Benzodiazepines: NOT DETECTED
COCAINE: NOT DETECTED
Opiates: NOT DETECTED
Tetrahydrocannabinol: POSITIVE — AB

## 2015-02-18 LAB — CBC
HEMATOCRIT: 37.2 % (ref 36.0–46.0)
Hemoglobin: 13 g/dL (ref 12.0–15.0)
MCH: 28.6 pg (ref 26.0–34.0)
MCHC: 34.9 g/dL (ref 30.0–36.0)
MCV: 81.9 fL (ref 78.0–100.0)
Platelets: 234 10*3/uL (ref 150–400)
RBC: 4.54 MIL/uL (ref 3.87–5.11)
RDW: 13.5 % (ref 11.5–15.5)
WBC: 8.9 10*3/uL (ref 4.0–10.5)

## 2015-02-18 LAB — URINALYSIS, ROUTINE W REFLEX MICROSCOPIC
Bilirubin Urine: NEGATIVE
GLUCOSE, UA: NEGATIVE mg/dL
Hgb urine dipstick: NEGATIVE
KETONES UR: NEGATIVE mg/dL
LEUKOCYTES UA: NEGATIVE
Nitrite: NEGATIVE
PROTEIN: NEGATIVE mg/dL
Specific Gravity, Urine: 1.015 (ref 1.005–1.030)
pH: 8.5 — ABNORMAL HIGH (ref 5.0–8.0)

## 2015-02-18 LAB — BASIC METABOLIC PANEL
ANION GAP: 9 (ref 5–15)
BUN: 6 mg/dL (ref 6–20)
CALCIUM: 9.1 mg/dL (ref 8.9–10.3)
CO2: 24 mmol/L (ref 22–32)
CREATININE: 0.66 mg/dL (ref 0.44–1.00)
Chloride: 106 mmol/L (ref 101–111)
Glucose, Bld: 99 mg/dL (ref 65–99)
Potassium: 3.4 mmol/L — ABNORMAL LOW (ref 3.5–5.1)
SODIUM: 139 mmol/L (ref 135–145)

## 2015-02-18 LAB — PREGNANCY, URINE: Preg Test, Ur: NEGATIVE

## 2015-02-18 LAB — TROPONIN I

## 2015-02-18 MED ORDER — DEXAMETHASONE SODIUM PHOSPHATE 10 MG/ML IJ SOLN
10.0000 mg | Freq: Once | INTRAMUSCULAR | Status: AC
  Administered 2015-02-18: 10 mg via INTRAVENOUS
  Filled 2015-02-18: qty 1

## 2015-02-18 MED ORDER — MAGNESIUM SULFATE 2 GM/50ML IV SOLN
2.0000 g | Freq: Once | INTRAVENOUS | Status: AC
  Administered 2015-02-18: 2 g via INTRAVENOUS
  Filled 2015-02-18: qty 50

## 2015-02-18 MED ORDER — SODIUM CHLORIDE 0.9 % IV BOLUS (SEPSIS)
1000.0000 mL | Freq: Once | INTRAVENOUS | Status: AC
  Administered 2015-02-18: 1000 mL via INTRAVENOUS

## 2015-02-18 MED ORDER — PROMETHAZINE HCL 25 MG RE SUPP: 25.0000 mg | Freq: Four times a day (QID) | RECTAL | Status: DC | PRN

## 2015-02-18 MED ORDER — PROMETHAZINE HCL 25 MG/ML IJ SOLN
25.0000 mg | Freq: Once | INTRAMUSCULAR | Status: AC
  Administered 2015-02-18: 25 mg via INTRAMUSCULAR
  Filled 2015-02-18: qty 1

## 2015-02-18 MED ORDER — DIPHENHYDRAMINE HCL 50 MG/ML IJ SOLN
25.0000 mg | Freq: Once | INTRAMUSCULAR | Status: AC
  Administered 2015-02-18: 25 mg via INTRAVENOUS
  Filled 2015-02-18: qty 1

## 2015-02-18 MED ORDER — PROCHLORPERAZINE EDISYLATE 5 MG/ML IJ SOLN
10.0000 mg | Freq: Once | INTRAMUSCULAR | Status: AC
  Administered 2015-02-18: 10 mg via INTRAVENOUS
  Filled 2015-02-18: qty 2

## 2015-02-18 MED ORDER — CYCLOBENZAPRINE HCL 10 MG PO TABS: 10.0000 mg | ORAL_TABLET | Freq: Three times a day (TID) | ORAL | Status: DC | PRN

## 2015-02-18 MED ORDER — PREDNISONE 20 MG PO TABS: ORAL_TABLET | ORAL | Status: DC

## 2015-02-18 MED ORDER — CYCLOBENZAPRINE HCL 10 MG PO TABS
10.0000 mg | ORAL_TABLET | Freq: Once | ORAL | Status: AC
  Administered 2015-02-18: 10 mg via ORAL
  Filled 2015-02-18: qty 1

## 2015-02-18 NOTE — ED Notes (Signed)
Pt c/o chest pain and sob x 2 days; pt states she recently started a new medication and is unsure if it is the cause of her pain

## 2015-02-18 NOTE — Discharge Instructions (Signed)
Take the medication as prescribed. Try ice and heat on your chest for comfort. You need to discuss your pain medications with your pain management doctor.   Chest Wall Pain Chest wall pain is pain in or around the bones and muscles of your chest. Sometimes, an injury causes this pain. Sometimes, the cause may not be known. This pain may take several weeks or longer to get better. HOME CARE Pay attention to any changes in your symptoms. Take these actions to help with your pain:  Rest as told by your doctor.  Avoid activities that cause pain. Try not to use your chest, belly (abdominal), or side muscles to lift heavy things.  If directed, apply ice to the painful area:  Put ice in a plastic bag.  Place a towel between your skin and the bag.  Leave the ice on for 20 minutes, 2-3 times per day.  Take over-the-counter and prescription medicines only as told by your doctor.  Do not use tobacco products, including cigarettes, chewing tobacco, and e-cigarettes. If you need help quitting, ask your doctor.  Keep all follow-up visits as told by your doctor. This is important. GET HELP IF:  You have a fever.  Your chest pain gets worse.  You have new symptoms. GET HELP RIGHT AWAY IF:  You feel sick to your stomach (nauseous) or you throw up (vomit).  You feel sweaty or light-headed.  You have a cough with phlegm (sputum) or you cough up blood.  You are short of breath.   This information is not intended to replace advice given to you by your health care provider. Make sure you discuss any questions you have with your health care provider.   Document Released: 06/22/2007 Document Revised: 09/24/2014 Document Reviewed: 03/31/2014 Elsevier Interactive Patient Education [employer]

## 2015-02-18 NOTE — ED Provider Notes (Signed)
CSN: [id number]     Arrival date & time 02/18/15  0400 History   First MD Initiated Contact with Patient 02/18/15 [phone]    Chief Complaint  Patient presents with  . Chest Pain     (Consider location/radiation/quality/duration/timing/severity/associated sxs/prior Treatment) HPI patient has a history of chronic pain. She states for the last 2 weeks she's been getting worse. She states 2 weeks ago she was started on a new medication by her pain management physician which was nucynta. She feels since last week she's having more shortness of breath. Yesterday she had vomiting about 4 times. She states she has pain in the left side of her chest and she describes it as dull, sharp, and pressure. It is been constant for at least 2-3 days. She denies any cough or fever. She denies any diarrhea. She denies any abdominal pain. But she has diffuse abdominal aches all over. She also complains of a headache that is diffuse and states her face is tender. She denies any blurred vision. She states she's having noise and light sensitivity.  PCP [facility] [place], [place]  Past Medical History  Diagnosis Date  . Hypertension   . Asthma   . Pelvic inflammatory disease (PID)   . Sickle cell disease (HCC)   . Fibromyalgia   . Chronic abdominal pain   . Migraine    Past Surgical History  Procedure Laterality Date  . Tubal ligation    . Kidney infections    . Wisdom tooth extraction     Family History  Problem Relation Age of Onset  . Hypertension Mother   . Diabetes Mother   . Heart failure Father    Social History  Substance Use Topics  . Smoking status: Light Tobacco Smoker    Types: Cigarettes  . Smokeless tobacco: Never Used     Comment: Occasional smoker  . Alcohol Use: Yes     Comment: occasionally-once a month   was smoking 1 pack per day, now using a nicotine patch 21 mg daily Applying for disability  OB History    Gravida  Para Term Preterm AB TAB SAB Ectopic Multiple Living   4 4 1 3      4      Review of Systems  All other systems reviewed and are negative.     Allergies  Metoclopramide; Tomato; Aspirin; Doxycycline; Hydromorphone hcl; Ibuprofen; Other; Shellfish allergy; Toradol; Latex; and Tramadol  Home Medications   Prior to Admission medications   Medication Sig Start Date End Date Taking? Authorizing Provider  albuterol (PROVENTIL HFA;VENTOLIN HFA) 108 (90 BASE) MCG/ACT inhaler Inhale 2 puffs into the lungs every 6 (six) hours as needed for wheezing or shortness of breath. Reported on 01/13/2015 01/14/12   Consuello Masse, MD  amitriptyline (ELAVIL) 100 MG tablet Take 1 tablet (100 mg total) by mouth at bedtime. 01/13/15   Suanne Marker, MD  atenolol (TENORMIN) 50 MG tablet Take 50 mg by mouth daily. Reported on 01/13/2015 12/18/14   Historical Provider, MD  butalbital-acetaminophen-caffeine (FIORICET, ESGIC) 50-325-40 MG tablet Take 1-2 tablets by mouth every 6 (six) hours as needed. headache 10/17/14   Historical Provider, MD  cloNIDine (CATAPRES) 0.1 MG tablet Take 1 tablet (0.1 mg total) by mouth every 8 (eight) hours as needed (muscle aches, withdrawl symptoms). 01/23/15   Azalia Bilis, MD  cyclobenzaprine (FLEXERIL) 10 MG tablet Take 1 tablet (10 mg total) by mouth 3 (three) times daily as needed for  muscle spasms. 02/18/15   Devoria Albe, MD  folic acid (FOLVITE) 1 MG tablet Take 1 tablet (1 mg total) by mouth daily. 10/17/14   Jerald Kief, MD  gabapentin (NEURONTIN) 300 MG capsule Take 1 capsule (300 mg total) by mouth 3 (three) times daily. Patient taking differently: Take 900 mg by mouth 3 (three) times daily.  11/18/14   Jaclyn Shaggy, MD  ondansetron (ZOFRAN ODT) 8 MG disintegrating tablet Take 1 tablet (8 mg total) by mouth every 8 (eight) hours as needed for nausea or vomiting. 01/23/15   Azalia Bilis, MD  oxyCODONE-acetaminophen (PERCOCET/ROXICET) 5-325 MG tablet Take 1-2 tablets by mouth  every 4 (four) hours as needed for severe pain. Patient taking differently: Take 0.5 tablets by mouth every 4 (four) hours as needed for severe pain.  01/14/15   Donnetta Hutching, MD  pantoprazole (PROTONIX) 40 MG tablet Take 40 mg by mouth daily as needed. pain 11/18/14   Historical Provider, MD  predniSONE (DELTASONE) 20 MG tablet Take 3 po QD x 3d , then 2 po QD x 3d then 1 po QD x 3d 02/18/15   Devoria Albe, MD  promethazine (PHENERGAN) 25 MG suppository Place 1 suppository (25 mg total) rectally every 6 (six) hours as needed for nausea or vomiting. 02/18/15   Devoria Albe, MD   BP 137/83 mmHg  Pulse 108  Temp(Src) 97.9 F (36.6 C) (Oral)  Resp 23  Ht 5' (1.524 m)  Wt 165 lb (74.844 kg)  BMI 32.22 kg/m2  SpO2 100%  LMP 01/31/2015  Vital signs normal except for tachycardia  Physical Exam  Constitutional: She is oriented to person, place, and time. She appears well-developed and well-nourished.  Non-toxic appearance. She does not appear ill. No distress.  HENT:  Head: Normocephalic and atraumatic.  Right Ear: External ear normal.  Left Ear: External ear normal.  Nose: Nose normal. No mucosal edema or rhinorrhea.  Mouth/Throat: Oropharynx is clear and moist and mucous membranes are normal. No dental abscesses or uvula swelling.  Eyes: Conjunctivae and EOM are normal. Pupils are equal, round, and reactive to light.  Neck: Normal range of motion and full passive range of motion without pain. Neck supple.  Cardiovascular: Normal rate, regular rhythm and normal heart sounds.  Exam reveals no gallop and no friction rub.   No murmur heard. Pulmonary/Chest: Effort normal and breath sounds normal. No respiratory distress. She has no wheezes. She has no rhonchi. She has no rales. She exhibits tenderness. She exhibits no crepitus.  Chest wall is extremely tender to palpation even to light touch  Abdominal: Soft. Normal appearance and bowel sounds are normal. She exhibits no distension. There is no tenderness.  There is no rebound and no guarding.  Musculoskeletal: Normal range of motion. She exhibits no edema or tenderness.  Moves all extremities well.   Neurological: She is alert and oriented to person, place, and time. She has normal strength. No cranial nerve deficit.  Skin: Skin is warm, dry and intact. No rash noted. No erythema. No pallor.  Psychiatric: She has a normal mood and affect. Her speech is normal and behavior is normal. Her mood appears not anxious.  Nursing note and vitals reviewed.   ED Course  Procedures (including critical care time)  Medications  promethazine (PHENERGAN) injection 25 mg (not administered)  sodium chloride 0.9 % bolus 1,000 mL (0 mLs Intravenous Stopped 02/18/15 0706)  dexamethasone (DECADRON) injection 10 mg (10 mg Intravenous Given 02/18/15 0540)  cyclobenzaprine (FLEXERIL) tablet 10  mg (10 mg Oral Given 02/18/15 0540)  diphenhydrAMINE (BENADRYL) injection 25 mg (25 mg Intravenous Given 02/18/15 0540)  magnesium sulfate IVPB 2 g 50 mL (0 g Intravenous Stopped 02/18/15 0600)  prochlorperazine (COMPAZINE) injection 10 mg (10 mg Intravenous Given 02/18/15 0702)    I have explained to patient only more glad to treat her nausea and her pain however she will not be getting any narcotics in the emergency room tonight. She was started on IV Decadron for an anti-inflammatory for her chest wall pain, oral Flexeril, and Benadryl IV. She also was given IV magnesium for her headache.  Patient states she isn't any better. She states she went to the bathroom and vomited. Patient was given IV Compazine and phenergan IM.  PT decided to leave,states "You aren't doing anything for me".   Review of the [place] shows patient has gotten 14 narcotic prescriptions since September 7. She has had 12 providers (she also got alprazolam or Valium) some from our emergency department, also from 2 different entities on his oral surgeon, one from the [facility], one from the [employer] [facility], and from a hospitalist,  a neurologist, and urgent care and [place].   Labs Review Results for orders placed or performed during the hospital encounter of 02/18/15  Basic metabolic panel  Result Value Ref Range   Sodium 139 135 - 145 mmol/L   Potassium 3.4 (L) 3.5 - 5.1 mmol/L   Chloride 106 101 - 111 mmol/L   CO2 24 22 - 32 mmol/L   Glucose, Bld 99 65 - 99 mg/dL   BUN 6 6 - 20 mg/dL   Creatinine, Ser 8.65 0.44 - 1.00 mg/dL   Calcium 9.1 8.9 - 78.4 mg/dL   GFR calc non Af Amer >60 >60 mL/min   GFR calc Af Amer >60 >60 mL/min   Anion gap 9 5 - 15  CBC  Result Value Ref Range   WBC 8.9 4.0 - 10.5 K/uL   RBC 4.54 3.87 - 5.11 MIL/uL   Hemoglobin 13.0 12.0 - 15.0 g/dL   HCT 69.6 29.5 - 28.4 %   MCV 81.9 78.0 - 100.0 fL   MCH 28.6 26.0 - 34.0 pg   MCHC 34.9 30.0 - 36.0 g/dL   RDW 13.2 44.0 - 10.2 %   Platelets 234 150 - 400 K/uL  Troponin I  Result Value Ref Range   Troponin I <0.03 <0.031 ng/mL  Urinalysis, Routine w reflex microscopic  Result Value Ref Range   Color, Urine YELLOW YELLOW   APPearance CLEAR CLEAR   Specific Gravity, Urine 1.015 1.005 - 1.030   pH 8.5 (H) 5.0 - 8.0   Glucose, UA NEGATIVE NEGATIVE mg/dL   Hgb urine dipstick NEGATIVE NEGATIVE   Bilirubin Urine NEGATIVE NEGATIVE   Ketones, ur NEGATIVE NEGATIVE mg/dL   Protein, ur NEGATIVE NEGATIVE mg/dL   Nitrite NEGATIVE NEGATIVE   Leukocytes, UA NEGATIVE NEGATIVE  Pregnancy, urine  Result Value Ref Range   Preg Test, Ur NEGATIVE NEGATIVE  Urine rapid drug screen (hosp performed)  Result Value Ref Range   Opiates NONE DETECTED NONE DETECTED   Cocaine NONE DETECTED NONE DETECTED   Benzodiazepines NONE DETECTED NONE DETECTED   Amphetamines NONE DETECTED NONE DETECTED   Tetrahydrocannabinol POSITIVE (A) NONE DETECTED   Barbiturates NONE DETECTED NONE DETECTED   Laboratory interpretation all normal except mild hypokalemia, +  UDS     Imaging Review Dg Chest 2 View  02/18/2015  CLINICAL DATA:  Chest pain EXAM: CHEST  2 VIEW COMPARISON:  01/23/2015 FINDINGS: Normal heart size and mediastinal contours. No acute infiltrate or edema. No effusion or pneumothorax. No acute osseous findings. IMPRESSION: Negative chest. Electronically Signed   By: Marnee Spring M.D.   On: 02/18/2015 04:46   I have personally reviewed and evaluated these images and lab results as part of my medical decision-making.   EKG Interpretation   Date/Time:  Wednesday February 18 2015 04:12:54 EST Ventricular Rate:  95 PR Interval:  128 QRS Duration: 88 QT Interval:  357 QTC Calculation: 449 R Axis:   75 Text Interpretation:  Sinus rhythm Normal ECG Since last tracing rate  slower (23 Jan 2015) Confirmed by [facility]  MD-I, Halo Shevlin ([id number]) on 02/18/2015  5:04:21 AM      MDM   Final diagnoses:  Chronic pain  Chest wall pain  Nausea and vomiting, vomiting of unspecified type    New Prescriptions   CYCLOBENZAPRINE (FLEXERIL) 10 MG TABLET    Take 1 tablet (10 mg total) by mouth 3 (three) times daily as needed for muscle spasms.   PREDNISONE (DELTASONE) 20 MG TABLET    Take 3 po QD x 3d , then 2 po QD x 3d then 1 po QD x 3d   PROMETHAZINE (PHENERGAN) 25 MG SUPPOSITORY    Place 1 suppository (25 mg total) rectally every 6 (six) hours as needed for nausea or vomiting.    Plan discharge  Devoria Albe, MD, Concha Pyo, MD 02/18/15 [phone]

## 2015-02-18 NOTE — ED Notes (Signed)
Pt c/o body weakness, chest hurts worse upon palpation since starting clonidine.

## 2015-02-19 ENCOUNTER — Telehealth: Payer: Self-pay | Admitting: Diagnostic Neuroimaging

## 2015-02-19 NOTE — Telephone Encounter (Signed)
Pt called requesting refill for butalbital-acetaminophen-caffeine (FIORICET, ESGIC) 50-325-40 MG tablet .

## 2015-02-19 NOTE — Telephone Encounter (Signed)
Spoke with patient and informed her that Dr Marjory Lies has released her to her PCP for further pain management and will not refill Fioricet. She stated "That is fine. I called my doctor, and she is referring me to [facility] or [facility]."  Informed her that she will need to sign release for this office to send records for her disability. She then stated she has a Neurosurgeon her disability papers and doesn't need records from this office. She then ended the call.

## 2015-02-19 NOTE — Telephone Encounter (Signed)
I called back and spoke with the patient.  She is requesting a Rx for Fioricet.  Says she received a migraine cocktail at ED, but it only made her sleep, and she still has headache with nausea.  Would you like to prescribe?  States she has not yet spoken with PCP regarding second opinion ([facility]).  She would like to know if we can refer her to [facility].   As well, says she is trying to get disability, and they are in need of her records from our office.

## 2015-02-19 NOTE — Telephone Encounter (Signed)
I will not refill fioricet. She should follow up with PCP. -VRP

## 2015-02-26 ENCOUNTER — Emergency Department (HOSPITAL_COMMUNITY): Payer: Medicaid Other

## 2015-02-26 ENCOUNTER — Emergency Department (HOSPITAL_COMMUNITY)
Admission: EM | Admit: 2015-02-26 | Discharge: 2015-02-26 | Disposition: A | Discharge status: Home / Self Care | Payer: Medicaid Other | Attending: Emergency Medicine | Admitting: Emergency Medicine

## 2015-02-26 ENCOUNTER — Encounter (HOSPITAL_COMMUNITY): Payer: Self-pay | Admitting: Emergency Medicine

## 2015-02-26 DIAGNOSIS — I10 Essential (primary) hypertension: Secondary | ICD-10-CM | POA: Diagnosis not present

## 2015-02-26 DIAGNOSIS — M797 Fibromyalgia: Secondary | ICD-10-CM | POA: Insufficient documentation

## 2015-02-26 DIAGNOSIS — Y998 Other external cause status: Secondary | ICD-10-CM | POA: Insufficient documentation

## 2015-02-26 DIAGNOSIS — S20222A Contusion of left back wall of thorax, initial encounter: Secondary | ICD-10-CM

## 2015-02-26 DIAGNOSIS — S300XXA Contusion of lower back and pelvis, initial encounter: Secondary | ICD-10-CM | POA: Diagnosis not present

## 2015-02-26 DIAGNOSIS — G43909 Migraine, unspecified, not intractable, without status migrainosus: Secondary | ICD-10-CM | POA: Diagnosis not present

## 2015-02-26 DIAGNOSIS — Z79899 Other long term (current) drug therapy: Secondary | ICD-10-CM | POA: Insufficient documentation

## 2015-02-26 DIAGNOSIS — Y9389 Activity, other specified: Secondary | ICD-10-CM | POA: Insufficient documentation

## 2015-02-26 DIAGNOSIS — W19XXXA Unspecified fall, initial encounter: Secondary | ICD-10-CM

## 2015-02-26 DIAGNOSIS — S3992XA Unspecified injury of lower back, initial encounter: Secondary | ICD-10-CM | POA: Diagnosis present

## 2015-02-26 DIAGNOSIS — G8929 Other chronic pain: Secondary | ICD-10-CM | POA: Insufficient documentation

## 2015-02-26 DIAGNOSIS — B379 Candidiasis, unspecified: Secondary | ICD-10-CM | POA: Diagnosis not present

## 2015-02-26 DIAGNOSIS — W010XXA Fall on same level from slipping, tripping and stumbling without subsequent striking against object, initial encounter: Secondary | ICD-10-CM | POA: Diagnosis not present

## 2015-02-26 DIAGNOSIS — Y9289 Other specified places as the place of occurrence of the external cause: Secondary | ICD-10-CM | POA: Insufficient documentation

## 2015-02-26 DIAGNOSIS — Z8742 Personal history of other diseases of the female genital tract: Secondary | ICD-10-CM | POA: Insufficient documentation

## 2015-02-26 DIAGNOSIS — J45909 Unspecified asthma, uncomplicated: Secondary | ICD-10-CM | POA: Insufficient documentation

## 2015-02-26 DIAGNOSIS — D571 Sickle-cell disease without crisis: Secondary | ICD-10-CM | POA: Insufficient documentation

## 2015-02-26 DIAGNOSIS — F17210 Nicotine dependence, cigarettes, uncomplicated: Secondary | ICD-10-CM | POA: Diagnosis not present

## 2015-02-26 DIAGNOSIS — B370 Candidal stomatitis: Secondary | ICD-10-CM

## 2015-02-26 DIAGNOSIS — Z3202 Encounter for pregnancy test, result negative: Secondary | ICD-10-CM | POA: Diagnosis not present

## 2015-02-26 DIAGNOSIS — Z91040 Latex allergy status: Secondary | ICD-10-CM | POA: Insufficient documentation

## 2015-02-26 LAB — COMPREHENSIVE METABOLIC PANEL
ALBUMIN: 4 g/dL (ref 3.5–5.0)
ALK PHOS: 48 U/L (ref 38–126)
ALT: 10 U/L — ABNORMAL LOW (ref 14–54)
ANION GAP: 10 (ref 5–15)
AST: 18 U/L (ref 15–41)
BILIRUBIN TOTAL: 0.4 mg/dL (ref 0.3–1.2)
BUN: 9 mg/dL (ref 6–20)
CO2: 24 mmol/L (ref 22–32)
Calcium: 9.3 mg/dL (ref 8.9–10.3)
Chloride: 105 mmol/L (ref 101–111)
Creatinine, Ser: 0.63 mg/dL (ref 0.44–1.00)
GFR calc Af Amer: >60 mL/min (ref >60)
GFR calc non Af Amer: >60 mL/min (ref >60)
GLUCOSE: 82 mg/dL (ref 65–99)
POTASSIUM: 3.9 mmol/L (ref 3.5–5.1)
SODIUM: 139 mmol/L (ref 135–145)
Total Protein: 7.3 g/dL (ref 6.5–8.1)

## 2015-02-26 LAB — CBC WITH DIFFERENTIAL/PLATELET
BASOS ABS: 0 10*3/uL (ref 0.0–0.1)
BASOS PCT: 0 %
EOS ABS: 0.3 10*3/uL (ref 0.0–0.7)
Eosinophils Relative: 2 %
HEMATOCRIT: 35.3 % — AB (ref 36.0–46.0)
HEMOGLOBIN: 12.4 g/dL (ref 12.0–15.0)
Lymphocytes Relative: 33 %
Lymphs Abs: 3.6 10*3/uL (ref 0.7–4.0)
MCH: 28.8 pg (ref 26.0–34.0)
MCHC: 35.1 g/dL (ref 30.0–36.0)
MCV: 81.9 fL (ref 78.0–100.0)
MONOS PCT: 7 %
Monocytes Absolute: 0.8 10*3/uL (ref 0.1–1.0)
NEUTROS ABS: 6.2 10*3/uL (ref 1.7–7.7)
NEUTROS PCT: 58 %
Platelets: 231 10*3/uL (ref 150–400)
RBC: 4.31 MIL/uL (ref 3.87–5.11)
RDW: 13.3 % (ref 11.5–15.5)
WBC: 10.9 10*3/uL — ABNORMAL HIGH (ref 4.0–10.5)

## 2015-02-26 LAB — POC URINE PREG, ED: Preg Test, Ur: NEGATIVE

## 2015-02-26 MED ORDER — OXYCODONE-ACETAMINOPHEN 5-325 MG PO TABS: 1.0000 | ORAL_TABLET | Freq: Four times a day (QID) | ORAL | Status: DC | PRN

## 2015-02-26 MED ORDER — SODIUM CHLORIDE 0.9 % IV BOLUS (SEPSIS)
1000.0000 mL | Freq: Once | INTRAVENOUS | Status: AC
Start: 2015-02-26 — End: 2015-02-26
  Administered 2015-02-26: 1000 mL via INTRAVENOUS

## 2015-02-26 MED ORDER — HYDROMORPHONE HCL 1 MG/ML IJ SOLN
1.0000 mg | Freq: Once | INTRAMUSCULAR | Status: AC
  Administered 2015-02-26: 1 mg via INTRAVENOUS
  Filled 2015-02-26: qty 1

## 2015-02-26 MED ORDER — ONDANSETRON 4 MG PO TBDP: ORAL_TABLET | ORAL | Status: DC

## 2015-02-26 MED ORDER — ONDANSETRON HCL 4 MG/2ML IJ SOLN
4.0000 mg | Freq: Once | INTRAMUSCULAR | Status: AC
  Administered 2015-02-26: 4 mg via INTRAVENOUS
  Filled 2015-02-26: qty 2

## 2015-02-26 MED ORDER — CLOTRIMAZOLE 10 MG MT TROC: 10.0000 mg | Freq: Every day | OROMUCOSAL | Status: DC

## 2015-02-26 MED ORDER — LORAZEPAM 2 MG/ML IJ SOLN
0.5000 mg | Freq: Once | INTRAMUSCULAR | Status: AC
  Administered 2015-02-26: 0.5 mg via INTRAVENOUS
  Filled 2015-02-26: qty 1

## 2015-02-26 MED ORDER — HYDROMORPHONE HCL 1 MG/ML IJ SOLN
0.5000 mg | Freq: Once | INTRAMUSCULAR | Status: AC
  Administered 2015-02-26: 0.5 mg via INTRAVENOUS
  Filled 2015-02-26: qty 1

## 2015-02-26 MED ORDER — DIPHENHYDRAMINE HCL 50 MG/ML IJ SOLN
25.0000 mg | Freq: Once | INTRAMUSCULAR | Status: AC
  Administered 2015-02-26: 25 mg via INTRAVENOUS
  Filled 2015-02-26: qty 1

## 2015-02-26 NOTE — ED Provider Notes (Signed)
CSN: [id number]     Arrival date & time 02/26/15  1413 History   First MD Initiated Contact with Patient 02/26/15 1529     Chief Complaint  Patient presents with  . Fall     (Consider location/radiation/quality/duration/timing/severity/associated sxs/prior Treatment) Patient is a 30 y.o. female presenting with fall. The history is provided by the patient (Patient states that she fell on her back and has pain in her left leg and lower back).  Fall This is a new problem. The current episode started 2 days ago. The problem occurs constantly. The problem has not changed since onset.Pertinent negatives include no chest pain, no abdominal pain and no headaches. Nothing aggravates the symptoms. Nothing relieves the symptoms.    Past Medical History  Diagnosis Date  . Hypertension   . Asthma   . Pelvic inflammatory disease (PID)   . Sickle cell disease (HCC)   . Fibromyalgia   . Chronic abdominal pain   . Migraine    Past Surgical History  Procedure Laterality Date  . Tubal ligation    . Kidney infections    . Wisdom tooth extraction     Family History  Problem Relation Age of Onset  . Hypertension Mother   . Diabetes Mother   . Heart failure Father    Social History  Substance Use Topics  . Smoking status: Heavy Tobacco Smoker -- 0.50 packs/day    Types: Cigarettes  . Smokeless tobacco: Never Used     Comment: Occasional smoker  . Alcohol Use: Yes     Comment: occasionally-once a month   OB History    Gravida Para Term Preterm AB TAB SAB Ectopic Multiple Living   4 4 1 3      4      Review of Systems  Constitutional: Negative for appetite change and fatigue.  HENT: Negative for congestion, ear discharge and sinus pressure.        Difficulty swallowing  Eyes: Negative for discharge.  Respiratory: Negative for cough.   Cardiovascular: Negative for chest pain.  Gastrointestinal: Negative for abdominal pain and diarrhea.  Genitourinary: Negative for frequency and  hematuria.  Musculoskeletal: Negative for back pain.       Back pain  Skin: Negative for rash.  Neurological: Negative for seizures and headaches.  Psychiatric/Behavioral: Negative for hallucinations.      Allergies  Metoclopramide; Tomato; Aspirin; Doxycycline; Hydromorphone hcl; Ibuprofen; Other; Shellfish allergy; Toradol; Latex; and Tramadol  Home Medications   Prior to Admission medications   Medication Sig Start Date End Date Taking? Authorizing Provider  tapentadol (NUCYNTA) 50 MG TABS tablet Take 50 mg by mouth every 8 (eight) hours as needed. 01/30/15  Yes Historical Provider, MD  albuterol (PROVENTIL HFA;VENTOLIN HFA) 108 (90 BASE) MCG/ACT inhaler Inhale 2 puffs into the lungs every 6 (six) hours as needed for wheezing or shortness of breath. Reported on 01/13/2015 01/14/12   01/16/12, MD  amitriptyline (ELAVIL) 100 MG tablet Take 1 tablet (100 mg total) by mouth at bedtime. 01/13/15   01/15/15, MD  atenolol (TENORMIN) 50 MG tablet Take 50 mg by mouth daily. Reported on 01/13/2015 12/18/14   Historical Provider, MD  butalbital-acetaminophen-caffeine (FIORICET, ESGIC) 50-325-40 MG tablet Take 1-2 tablets by mouth every 6 (six) hours as needed. headache 10/17/14   Historical Provider, MD  cloNIDine (CATAPRES) 0.1 MG tablet Take 1 tablet (0.1 mg total) by mouth every 8 (eight) hours as needed (muscle aches, withdrawl symptoms). 01/23/15   03/23/15, MD  clotrimazole (MYCELEX) 10 MG troche Take 1 tablet (10 mg total) by mouth 5 (five) times daily. 02/26/15   Bethann Berkshire, MD  cyclobenzaprine (FLEXERIL) 10 MG tablet Take 1 tablet (10 mg total) by mouth 3 (three) times daily as needed for muscle spasms. 02/18/15   Devoria Albe, MD  FLUoxetine (PROZAC) 20 MG capsule Take 20 mg by mouth daily. 02/19/15   Historical Provider, MD  folic acid (FOLVITE) 1 MG tablet Take 1 tablet (1 mg total) by mouth daily. 10/17/14   Jerald Kief, MD  gabapentin (NEURONTIN) 300 MG capsule Take 1  capsule (300 mg total) by mouth 3 (three) times daily. Patient taking differently: Take 900 mg by mouth 3 (three) times daily.  11/18/14   Jaclyn Shaggy, MD  ondansetron (ZOFRAN ODT) 4 MG disintegrating tablet 4mg  ODT q4 hours prn nausea/vomit 02/26/15   04/26/15, MD  oxyCODONE-acetaminophen (PERCOCET/ROXICET) 5-325 MG tablet Take 1 tablet by mouth every 6 (six) hours as needed. 02/26/15   04/26/15, MD  pantoprazole (PROTONIX) 40 MG tablet Take 40 mg by mouth daily as needed. pain 11/18/14   Historical Provider, MD  predniSONE (DELTASONE) 20 MG tablet Take 3 po QD x 3d , then 2 po QD x 3d then 1 po QD x 3d 02/18/15   04/18/15, MD  promethazine (PHENERGAN) 25 MG suppository Place 1 suppository (25 mg total) rectally every 6 (six) hours as needed for nausea or vomiting. 02/18/15   04/18/15, MD   BP 114/76 mmHg  Pulse 89  Temp(Src) 98.3 F (36.8 C) (Oral)  Resp 16  Ht 5' (1.524 m)  Wt 162 lb (73.483 kg)  BMI 31.64 kg/m2  SpO2 100%  LMP 01/31/2015 Physical Exam  Constitutional: She is oriented to person, place, and time. She appears well-developed.  HENT:  Head: Normocephalic.  Eyes: Conjunctivae and EOM are normal. No scleral icterus.  Neck: Neck supple. No thyromegaly present.  Cardiovascular: Normal rate and regular rhythm.  Exam reveals no gallop and no friction rub.   No murmur heard. Pulmonary/Chest: No stridor. She has no wheezes. She has no rales. She exhibits no tenderness.  Abdominal: She exhibits no distension. There is no tenderness. There is no rebound.  Musculoskeletal: Normal range of motion. She exhibits no edema.  Lumbar spine tenderness positive straight leg raise on left  Lymphadenopathy:    She has no cervical adenopathy.  Neurological: She is alert and oriented to person, place, and time. She has normal reflexes. She exhibits normal muscle tone. Coordination normal.  Skin: No rash noted. No erythema.  Psychiatric: She has a normal mood and affect. Her behavior is  normal.    ED Course  Procedures (including critical care time) Labs Review Labs Reviewed  CBC WITH DIFFERENTIAL/PLATELET - Abnormal; Notable for the following:    WBC 10.9 (*)    HCT 35.3 (*)    All other components within normal limits  COMPREHENSIVE METABOLIC PANEL - Abnormal; Notable for the following:    ALT 10 (*)    All other components within normal limits  POC URINE PREG, ED    Imaging Review Dg Lumbar Spine Complete  02/26/2015  CLINICAL DATA:  Left Lower back pain, radiating down left leg x 2 days. Pt stated she was walking with a walker down driveway, slipped on a rock and fell onto ground. History of back spasms. EXAM: LUMBAR SPINE - COMPLETE 4+ VIEW COMPARISON:  Lumbar MRI, 10/12/2014. FINDINGS: There is no evidence of lumbar spine fracture.  Alignment is normal. Intervertebral disc spaces are maintained. IMPRESSION: Negative. Electronically Signed   By: Amie Portland M.D.   On: 02/26/2015 17:53   I have personally reviewed and evaluated these images and lab results as part of my medical decision-making.   EKG Interpretation None      MDM   Final diagnoses:  Fall, initial encounter  Back contusion, left, initial encounter  Thrush    Diagnosis contusion the lumbar spine. Will treat with Percocet and Zofran and rest. Patient also has mild thrush probably secondary to 9 days of prednisone. She will be put on Mycelex troches.  She will follow-up her PCP in a week    Bethann Berkshire, MD 02/26/15 1858

## 2015-02-26 NOTE — Discharge Instructions (Signed)
Follow up with your family md in one week °

## 2015-02-26 NOTE — ED Notes (Signed)
PT stated she has a walker d/t fibromyalgia pain and was going to the car and slipped on a rock and fell onto ground. PT c/o lower back pain radiating into her left leg worsening since fall. PT ambulatory in triage and doesn't have walker with her.

## 2015-02-26 NOTE — ED Notes (Signed)
Assumed care of patient from [place], [place]. Pt awaiting father to provide transportation home. Pt stable. States med have improved her condition. VSS. Call bell within reach. No distress. Will monitor.

## 2015-03-02 ENCOUNTER — Other Ambulatory Visit: Payer: Self-pay | Admitting: Family Medicine

## 2015-04-06 ENCOUNTER — Encounter (HOSPITAL_COMMUNITY): Payer: Self-pay | Admitting: Emergency Medicine

## 2015-04-06 ENCOUNTER — Emergency Department (HOSPITAL_COMMUNITY)
Admission: EM | Admit: 2015-04-06 | Discharge: 2015-04-06 | Disposition: A | Discharge status: Home / Self Care | Payer: Medicaid Other | Attending: Emergency Medicine | Admitting: Emergency Medicine

## 2015-04-06 DIAGNOSIS — Z79899 Other long term (current) drug therapy: Secondary | ICD-10-CM | POA: Insufficient documentation

## 2015-04-06 DIAGNOSIS — Z87891 Personal history of nicotine dependence: Secondary | ICD-10-CM | POA: Diagnosis not present

## 2015-04-06 DIAGNOSIS — M797 Fibromyalgia: Secondary | ICD-10-CM | POA: Diagnosis not present

## 2015-04-06 DIAGNOSIS — I10 Essential (primary) hypertension: Secondary | ICD-10-CM | POA: Insufficient documentation

## 2015-04-06 DIAGNOSIS — R52 Pain, unspecified: Secondary | ICD-10-CM

## 2015-04-06 DIAGNOSIS — J45909 Unspecified asthma, uncomplicated: Secondary | ICD-10-CM | POA: Insufficient documentation

## 2015-04-06 DIAGNOSIS — R112 Nausea with vomiting, unspecified: Secondary | ICD-10-CM | POA: Diagnosis not present

## 2015-04-06 LAB — CBC WITH DIFFERENTIAL/PLATELET
Basophils Absolute: 0 10*3/uL (ref 0.0–0.1)
Basophils Relative: 0 %
Eosinophils Absolute: 0.3 10*3/uL (ref 0.0–0.7)
Eosinophils Relative: 4 %
HCT: 38.9 % (ref 36.0–46.0)
Hemoglobin: 13.5 g/dL (ref 12.0–15.0)
Lymphocytes Relative: 42 %
Lymphs Abs: 3 10*3/uL (ref 0.7–4.0)
MCH: 28.6 pg (ref 26.0–34.0)
MCHC: 34.7 g/dL (ref 30.0–36.0)
MCV: 82.4 fL (ref 78.0–100.0)
Monocytes Absolute: 0.5 10*3/uL (ref 0.1–1.0)
Monocytes Relative: 7 %
Neutro Abs: 3.4 10*3/uL (ref 1.7–7.7)
Neutrophils Relative %: 47 %
Platelets: 222 10*3/uL (ref 150–400)
RBC: 4.72 MIL/uL (ref 3.87–5.11)
RDW: 13.7 % (ref 11.5–15.5)
WBC: 7.3 10*3/uL (ref 4.0–10.5)

## 2015-04-06 LAB — COMPREHENSIVE METABOLIC PANEL
ALT: 16 U/L (ref 14–54)
AST: 20 U/L (ref 15–41)
Albumin: 3.9 g/dL (ref 3.5–5.0)
Alkaline Phosphatase: 59 U/L (ref 38–126)
Anion gap: 6 (ref 5–15)
BUN: 6 mg/dL (ref 6–20)
CO2: 26 mmol/L (ref 22–32)
Calcium: 8.9 mg/dL (ref 8.9–10.3)
Chloride: 107 mmol/L (ref 101–111)
Creatinine, Ser: 0.65 mg/dL (ref 0.44–1.00)
GFR calc Af Amer: >60 mL/min (ref >60)
GFR calc non Af Amer: >60 mL/min (ref >60)
Glucose, Bld: 91 mg/dL (ref 65–99)
Potassium: 4.2 mmol/L (ref 3.5–5.1)
Sodium: 139 mmol/L (ref 135–145)
Total Bilirubin: 0.3 mg/dL (ref 0.3–1.2)
Total Protein: 7.3 g/dL (ref 6.5–8.1)

## 2015-04-06 MED ORDER — PROMETHAZINE HCL 6.25 MG/5ML PO SYRP: 25.0000 mg | ORAL_SOLUTION | Freq: Four times a day (QID) | ORAL | Status: DC | PRN

## 2015-04-06 MED ORDER — PROMETHAZINE HCL 25 MG/ML IJ SOLN
INTRAMUSCULAR | Status: AC
  Filled 2015-04-06: qty 1

## 2015-04-06 MED ORDER — PROMETHAZINE HCL 25 MG PO TABS: 25.0000 mg | ORAL_TABLET | Freq: Four times a day (QID) | ORAL | Status: DC | PRN

## 2015-04-06 MED ORDER — SODIUM CHLORIDE 0.9 % IV BOLUS (SEPSIS)
1000.0000 mL | Freq: Once | INTRAVENOUS | Status: AC
  Administered 2015-04-06: 1000 mL via INTRAVENOUS

## 2015-04-06 MED ORDER — MORPHINE SULFATE (PF) 4 MG/ML IV SOLN
4.0000 mg | Freq: Once | INTRAVENOUS | Status: AC
  Administered 2015-04-06: 4 mg via INTRAVENOUS
  Filled 2015-04-06: qty 1

## 2015-04-06 MED ORDER — ONDANSETRON HCL 4 MG/2ML IJ SOLN
4.0000 mg | Freq: Once | INTRAMUSCULAR | Status: AC
  Administered 2015-04-06: 4 mg via INTRAVENOUS
  Filled 2015-04-06: qty 2

## 2015-04-06 MED ORDER — ACETAMINOPHEN 500 MG PO TABS
1000.0000 mg | ORAL_TABLET | Freq: Once | ORAL | Status: AC
  Administered 2015-04-06: 1000 mg via ORAL
  Filled 2015-04-06: qty 2

## 2015-04-06 MED ORDER — DIPHENHYDRAMINE HCL 50 MG/ML IJ SOLN
12.5000 mg | Freq: Once | INTRAMUSCULAR | Status: AC
  Administered 2015-04-06: 12.5 mg via INTRAVENOUS
  Filled 2015-04-06: qty 1

## 2015-04-06 MED ORDER — PROMETHAZINE HCL 25 MG/ML IJ SOLN
12.5000 mg | Freq: Once | INTRAMUSCULAR | Status: AC
  Administered 2015-04-06: 12.5 mg via INTRAVENOUS

## 2015-04-06 MED ORDER — PROMETHAZINE HCL 25 MG RE SUPP
25.0000 mg | Freq: Four times a day (QID) | RECTAL | Status: DC | PRN
Start: 2015-04-06 — End: 2015-05-19

## 2015-04-06 MED ORDER — METHYLPREDNISOLONE SODIUM SUCC 125 MG IJ SOLR
80.0000 mg | Freq: Once | INTRAMUSCULAR | Status: AC
  Administered 2015-04-06: 80 mg via INTRAVENOUS
  Filled 2015-04-06: qty 2

## 2015-04-06 MED ORDER — PROMETHAZINE HCL 25 MG RE SUPP: 25.0000 mg | Freq: Four times a day (QID) | RECTAL | Status: DC | PRN

## 2015-04-06 NOTE — ED Notes (Signed)
Pt states she thinks she is having a fibromyalgia flare up. Pt c/o generalized body aches since Wednesday. C/o feeling swollen in legs and back. Pt also reports vomiting x 2 days.

## 2015-04-06 NOTE — ED Notes (Signed)
Pt given ginger ale.

## 2015-04-06 NOTE — ED Notes (Signed)
Patient states she is still having pain feels like "burning" in her stomach.

## 2015-04-06 NOTE — ED Provider Notes (Signed)
The patient is a 30 year old female, she has a history that she claims of having fibromyalgia. She states that she has pain from her head to her toes. She has been vomiting for a couple of days. She has tried her home medications without relief. On exam the patient has vital signs which are rather unremarkable other than mild tachycardia. She is tender to palpation or even light touch to her arms, legs, back, neck, scalp, abdomen. She has moist mucous membranes, normal phonation, normal lung sounds. I doubt there is a pathologic process, labs ordered, fluids ordered, antinausea medication ordered, reevaluate.  Filed Vitals:   04/06/15 0959  BP: 132/92  Pulse: 109  Temp: 97.8 F (36.6 C)  Resp: 18   Medical screening examination/treatment/procedure(s) were conducted as a shared visit with non-physician practitioner(s) and myself.  I personally evaluated the patient during the encounter.  Clinical Impression:   Final diagnoses:  Body aches  Non-intractable vomiting with nausea, vomiting of unspecified type  Fibromyalgia         Eber Hong, MD 04/08/15 1450

## 2015-04-06 NOTE — ED Notes (Signed)
Pt c/o generalized aching, swelling in her legs and feet, and emesis since Friday. Pt states she has fibromyalgia and this feels like a flare up. Pt reports unable to tolerate anything PO. Denies diarrhea.

## 2015-04-06 NOTE — Discharge Instructions (Signed)
Medications: Phenergan  Treatment: Please take Phenergan every 6 hours as needed for nausea and vomiting. Please use suppository form if you are not able to tolerate any oral intake. Do not take both at the same time.  Follow-up: Please follow up with Dr. Ledell Peoples if your symptoms do not improve in the next few days. Please return to the emergency department if your symptoms worsen, or you develop any new or concerning symptoms.  Nausea and Vomiting Nausea is a sick feeling that often comes before throwing up (vomiting). Vomiting is a reflex where stomach contents come out of your mouth. Vomiting can cause severe loss of body fluids (dehydration). Children and elderly adults can become dehydrated quickly, especially if they also have diarrhea. Nausea and vomiting are symptoms of a condition or disease. It is important to find the cause of your symptoms. CAUSES   Direct irritation of the stomach lining. This irritation can result from increased acid production (gastroesophageal reflux disease), infection, food poisoning, taking certain medicines (such as nonsteroidal anti-inflammatory drugs), alcohol use, or tobacco use.  Signals from the brain.These signals could be caused by a headache, heat exposure, an inner ear disturbance, increased pressure in the brain from injury, infection, a tumor, or a concussion, pain, emotional stimulus, or metabolic problems.  An obstruction in the gastrointestinal tract (bowel obstruction).  Illnesses such as diabetes, hepatitis, gallbladder problems, appendicitis, kidney problems, cancer, sepsis, atypical symptoms of a heart attack, or eating disorders.  Medical treatments such as chemotherapy and radiation.  Receiving medicine that makes you sleep (general anesthetic) during surgery. DIAGNOSIS Your caregiver may ask for tests to be done if the problems do not improve after a few days. Tests may also be done if symptoms are severe or if the reason for the nausea  and vomiting is not clear. Tests may include:  Urine tests.  Blood tests.  Stool tests.  Cultures (to look for evidence of infection).  X-rays or other imaging studies. Test results can help your caregiver make decisions about treatment or the need for additional tests. TREATMENT You need to stay well hydrated. Drink frequently but in small amounts.You may wish to drink water, sports drinks, clear broth, or eat frozen ice pops or gelatin dessert to help stay hydrated.When you eat, eating slowly may help prevent nausea.There are also some antinausea medicines that may help prevent nausea. HOME CARE INSTRUCTIONS   Take all medicine as directed by your caregiver.  If you do not have an appetite, do not force yourself to eat. However, you must continue to drink fluids.  If you have an appetite, eat a normal diet unless your caregiver tells you differently.  Eat a variety of complex carbohydrates (rice, wheat, potatoes, bread), lean meats, yogurt, fruits, and vegetables.  Avoid high-fat foods because they are more difficult to digest.  Drink enough water and fluids to keep your urine clear or pale yellow.  If you are dehydrated, ask your caregiver for specific rehydration instructions. Signs of dehydration may include:  Severe thirst.  Dry lips and mouth.  Dizziness.  Dark urine.  Decreasing urine frequency and amount.  Confusion.  Rapid breathing or pulse. SEEK IMMEDIATE MEDICAL CARE IF:   You have blood or brown flecks (like coffee grounds) in your vomit.  You have black or bloody stools.  You have a severe headache or stiff neck.  You are confused.  You have severe abdominal pain.  You have chest pain or trouble breathing.  You do not urinate at least  once every 8 hours.  You develop cold or clammy skin.  You continue to vomit for longer than 24 to 48 hours.  You have a fever. MAKE SURE YOU:   Understand these instructions.  Will watch your  condition.  Will get help right away if you are not doing well or get worse.   This information is not intended to replace advice given to you by your health care provider. Make sure you discuss any questions you have with your health care provider.   Document Released: 01/03/2005 Document Revised: 03/28/2011 Document Reviewed: 06/02/2010 Elsevier Interactive Patient Education [employer]

## 2015-04-06 NOTE — ED Provider Notes (Signed)
CSN: [id number]     Arrival date & time 04/06/15  6384 History   First MD Initiated Contact with Patient 04/06/15 1013     Chief Complaint  Patient presents with  . Generalized Body Aches     (Consider location/radiation/quality/duration/timing/severity/associated sxs/prior Treatment) HPI Comments: Patient is a 30yo female with history of fibromyalgia and asthma who presents today with generalized body aches and pains and nausea and vomiting. Patient began with the body aches on Wednesday and began with nausea and vomiting on Saturday. Patient has not been able to tolerate PO. Patient describes her pain as aching, throbbing, and tingling mostly in her back and legs. She states she has been restless. Patient has tried gabapentin and Cymbalta without relief. Patient also endorses generalized abdominal pain and HA. 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 Patient has tried Fiorcet without relief. Patient reports occasional nonproductive cough that "feels like an asthma attack" since Saturday. Denies fevers, chest pain, shortness of breath, diarrhea, dysuria. Patient reports she felt this way during her last flare up of fibromyalgia for which she was admitted to the hospital for observation.  The history is provided by the patient. No language interpreter was used.    Past Medical History  Diagnosis Date  . Hypertension   . Asthma   . Pelvic inflammatory disease (PID)   . Sickle cell disease (HCC)   . Fibromyalgia   . Chronic abdominal pain   . Migraine    Past Surgical History  Procedure Laterality Date  . Tubal ligation    . Kidney infections    . Wisdom tooth extraction     Family History  Problem Relation Age of Onset  . Hypertension Mother   . Diabetes Mother   . Heart failure Father    Social History  Substance Use Topics  . Smoking status: Former Smoker -- 0.50 packs/day    Types: Cigarettes    Quit date: 03/18/2015  . Smokeless tobacco: Never Used     Comment:  Occasional smoker  . Alcohol Use: Yes     Comment: occasionally-once a month   OB History    Gravida Para Term Preterm AB TAB SAB Ectopic Multiple Living   4 4 1 3      4      Review of Systems  Constitutional: Negative for fever and chills.  HENT: Negative for facial swelling.   Respiratory: Positive for cough. Negative for shortness of breath.   Cardiovascular: Negative for chest pain.  Gastrointestinal: Positive for nausea, vomiting and abdominal pain. Negative for diarrhea.  Genitourinary: Negative for dysuria.  Musculoskeletal: Positive for myalgias, back pain and neck pain.  Skin: Negative for rash and wound.  Neurological: Positive for headaches.  Psychiatric/Behavioral: The patient is not nervous/anxious.       Allergies  Metoclopramide; Tomato; Aspirin; Doxycycline; Hydromorphone hcl; Ibuprofen; Ondansetron; Other; Shellfish allergy; Toradol; Latex; and Tramadol  Home Medications   Prior to Admission medications   Medication Sig Start Date End Date Taking? Authorizing Provider  albuterol (PROVENTIL HFA;VENTOLIN HFA) 108 (90 BASE) MCG/ACT inhaler Inhale 2 puffs into the lungs every 6 (six) hours as needed for wheezing or shortness of breath. Reported on 01/13/2015 01/14/12  Yes Consuello Masse, MD  atenolol (TENORMIN) 50 MG tablet Take 50 mg by mouth daily. Reported on 01/13/2015 12/18/14  Yes Historical Provider, MD  butalbital-acetaminophen-caffeine (FIORICET, ESGIC) 50-325-40 MG tablet Take 1-2 tablets by mouth every 6 (six) hours as needed. headache 10/17/14  Yes Historical Provider, MD  folic  acid (FOLVITE) 1 MG tablet Take 1 tablet (1 mg total) by mouth daily. 10/17/14  Yes Jerald Kief, MD  gabapentin (NEURONTIN) 300 MG capsule Take 1 capsule (300 mg total) by mouth 3 (three) times daily. Patient taking differently: Take 600 mg by mouth 3 (three) times daily.  11/18/14  Yes Jaclyn Shaggy, MD  pantoprazole (PROTONIX) 40 MG tablet Take 40 mg by mouth daily as needed. pain  11/18/14  Yes Historical Provider, MD  rOPINIRole (REQUIP) 0.25 MG tablet Take 0.25 mg by mouth at bedtime.   Yes Historical Provider, MD  tapentadol (NUCYNTA) 50 MG TABS tablet Take 50 mg by mouth every 8 (eight) hours as needed for moderate pain.  01/30/15  Yes Historical Provider, MD  traZODone (DESYREL) 100 MG tablet Take 100-200 mg by mouth at bedtime.   Yes Historical Provider, MD  amitriptyline (ELAVIL) 100 MG tablet Take 1 tablet (100 mg total) by mouth at bedtime. 01/13/15   Suanne Marker, MD  cefdinir (OMNICEF) 300 MG capsule Take 1 capsule by mouth 2 (two) times daily. Reported on 04/06/2015 03/25/15   Historical Provider, MD  cloNIDine (CATAPRES) 0.1 MG tablet Take 1 tablet (0.1 mg total) by mouth every 8 (eight) hours as needed (muscle aches, withdrawl symptoms). Patient not taking: Reported on 04/06/2015 01/23/15   Azalia Bilis, MD  clotrimazole [facility]) 10 MG troche Take 1 tablet (10 mg total) by mouth 5 (five) times daily. Patient not taking: Reported on 04/06/2015 02/26/15   Bethann Berkshire, MD  cyclobenzaprine (FLEXERIL) 10 MG tablet Take 1 tablet (10 mg total) by mouth 3 (three) times daily as needed for muscle spasms. Patient not taking: Reported on 04/06/2015 02/18/15   Devoria Albe, MD  fluconazole (DIFLUCAN) 100 MG tablet Take 1 tablet by mouth daily. Reported on 04/06/2015 03/25/15   Historical Provider, MD  oxyCODONE-acetaminophen (PERCOCET/ROXICET) 5-325 MG tablet Take 1 tablet by mouth every 6 (six) hours as needed. Patient not taking: Reported on 04/06/2015 02/26/15   Bethann Berkshire, MD  predniSONE (DELTASONE) 20 MG tablet Take 3 po QD x 3d , then 2 po QD x 3d then 1 po QD x 3d Patient not taking: Reported on 04/06/2015 02/18/15   Devoria Albe, MD  promethazine (PHENERGAN) 25 MG suppository Place 1 suppository (25 mg total) rectally every 6 (six) hours as needed for nausea or vomiting. 04/06/15   Emi Holes, PA-C  promethazine (PHENERGAN) 6.25 MG/5ML syrup Take 20 mLs (25 mg total) by mouth  every 6 (six) hours as needed for nausea or vomiting. 04/06/15   Waylan Boga Areliz Rothman, PA-C   BP 140/85 mmHg  Pulse 88  Temp(Src) 98 F (36.7 C) (Oral)  Resp 16  Ht 5' (1.524 m)  Wt 79.379 kg  BMI 34.18 kg/m2  SpO2 100%  LMP 04/04/2015 Physical Exam  Constitutional: She appears well-developed and well-nourished. No distress.  HENT:  Head: Normocephalic and atraumatic.  Mouth/Throat: Mucous membranes are dry. No oropharyngeal exudate.  Eyes: Conjunctivae and EOM are normal. Pupils are equal, round, and reactive to light. Right eye exhibits no discharge. Left eye exhibits no discharge. No scleral icterus.  Neck: Normal range of motion. Neck supple. No thyromegaly present.  Cardiovascular: Normal rate, regular rhythm, normal heart sounds and intact distal pulses.  Exam reveals no gallop and no friction rub.   No murmur heard. Pulmonary/Chest: Effort normal and breath sounds normal. No stridor. No respiratory distress. She has no wheezes. She has no rales.  Abdominal: Soft. Bowel sounds are normal.  She exhibits no distension. There is tenderness (Diffuse). There is no rebound and no guarding.  Musculoskeletal: She exhibits tenderness. She exhibits no edema.  Lymphadenopathy:    She has no cervical adenopathy.  Neurological: She is alert. Coordination normal.  CN 3-12, no focal neuro deficits, Normal sensation; patient very reactive to any light touch diffusely  Skin: Skin is warm and dry. No rash noted. She is not diaphoretic. No pallor.  Psychiatric: She has a normal mood and affect.  Nursing note and vitals reviewed.   ED Course  Procedures (including critical care time) Labs Review Labs Reviewed  COMPREHENSIVE METABOLIC PANEL  CBC WITH DIFFERENTIAL/PLATELET    Imaging Review No results found. I have personally reviewed and evaluated these images and lab results as part of my medical decision-making.   EKG Interpretation None      MDM   Patient is a 30yo F with PMHx of  fibromyalgia who presents today with generalized body aches and tenderness, nausea, and vomiting. CBC and CMP completely unremarkable. Given Zofran in ED and patient reported burning in her stomach. Given Tylenol for pain and pt apparently vomited after nurse left room. Given IV phenergan and morphine before discharge. Discharged with Phenergan. Patient's pain and nausea improved and stable at discharge. She stated she "just needed some rest to feel better." Discussed follow up with PCP for further evaluation and treatment. Patient also evaluated by Dr. Hyacinth Meeker who is in agreement with the plan.   Final diagnoses:  Body aches  Non-intractable vomiting with nausea, vomiting of unspecified type  Fibromyalgia      Emi Holes, PA-C 04/06/15 1529  Eber Hong, MD 04/08/15 1450

## 2015-04-08 ENCOUNTER — Emergency Department (HOSPITAL_COMMUNITY)
Admission: EM | Admit: 2015-04-08 | Discharge: 2015-04-08 | Disposition: A | Discharge status: Home / Self Care | Payer: Medicaid Other | Attending: Emergency Medicine | Admitting: Emergency Medicine

## 2015-04-08 ENCOUNTER — Emergency Department (HOSPITAL_COMMUNITY): Payer: Medicaid Other

## 2015-04-08 ENCOUNTER — Encounter (HOSPITAL_COMMUNITY): Payer: Self-pay | Admitting: Emergency Medicine

## 2015-04-08 DIAGNOSIS — J45909 Unspecified asthma, uncomplicated: Secondary | ICD-10-CM | POA: Diagnosis not present

## 2015-04-08 DIAGNOSIS — Z79899 Other long term (current) drug therapy: Secondary | ICD-10-CM | POA: Insufficient documentation

## 2015-04-08 DIAGNOSIS — Z87891 Personal history of nicotine dependence: Secondary | ICD-10-CM | POA: Diagnosis not present

## 2015-04-08 DIAGNOSIS — R52 Pain, unspecified: Secondary | ICD-10-CM

## 2015-04-08 DIAGNOSIS — R1084 Generalized abdominal pain: Secondary | ICD-10-CM | POA: Insufficient documentation

## 2015-04-08 DIAGNOSIS — I10 Essential (primary) hypertension: Secondary | ICD-10-CM | POA: Insufficient documentation

## 2015-04-08 DIAGNOSIS — R109 Unspecified abdominal pain: Secondary | ICD-10-CM

## 2015-04-08 LAB — CBC WITH DIFFERENTIAL/PLATELET
Basophils Absolute: 0.1 10*3/uL (ref 0.0–0.1)
Basophils Relative: 1 %
EOS ABS: 0.3 10*3/uL (ref 0.0–0.7)
EOS PCT: 2 %
HCT: 36.2 % (ref 36.0–46.0)
HEMOGLOBIN: 12.8 g/dL (ref 12.0–15.0)
LYMPHS ABS: 4.7 10*3/uL — AB (ref 0.7–4.0)
Lymphocytes Relative: 43 %
MCH: 29 pg (ref 26.0–34.0)
MCHC: 35.4 g/dL (ref 30.0–36.0)
MCV: 82.1 fL (ref 78.0–100.0)
MONO ABS: 0.8 10*3/uL (ref 0.1–1.0)
MONOS PCT: 7 %
Neutro Abs: 5.3 10*3/uL (ref 1.7–7.7)
Neutrophils Relative %: 47 %
Platelets: 227 10*3/uL (ref 150–400)
RBC: 4.41 MIL/uL (ref 3.87–5.11)
RDW: 13.5 % (ref 11.5–15.5)
WBC: 11.1 10*3/uL — ABNORMAL HIGH (ref 4.0–10.5)

## 2015-04-08 LAB — COMPREHENSIVE METABOLIC PANEL
ALK PHOS: 52 U/L (ref 38–126)
ALT: 13 U/L — ABNORMAL LOW (ref 14–54)
ANION GAP: 6 (ref 5–15)
AST: 19 U/L (ref 15–41)
Albumin: 4.1 g/dL (ref 3.5–5.0)
BUN: 6 mg/dL (ref 6–20)
CALCIUM: 8.7 mg/dL — AB (ref 8.9–10.3)
CO2: 25 mmol/L (ref 22–32)
Chloride: 106 mmol/L (ref 101–111)
Creatinine, Ser: 0.58 mg/dL (ref 0.44–1.00)
GFR calc non Af Amer: >60 mL/min (ref >60)
Glucose, Bld: 86 mg/dL (ref 65–99)
Potassium: 3.7 mmol/L (ref 3.5–5.1)
SODIUM: 137 mmol/L (ref 135–145)
TOTAL PROTEIN: 7.2 g/dL (ref 6.5–8.1)
Total Bilirubin: 0.4 mg/dL (ref 0.3–1.2)

## 2015-04-08 LAB — URINALYSIS, ROUTINE W REFLEX MICROSCOPIC
BILIRUBIN URINE: NEGATIVE
Glucose, UA: NEGATIVE mg/dL
Hgb urine dipstick: NEGATIVE
KETONES UR: NEGATIVE mg/dL
Leukocytes, UA: NEGATIVE
NITRITE: NEGATIVE
Protein, ur: NEGATIVE mg/dL
Specific Gravity, Urine: 1.005 (ref 1.005–1.030)
pH: 8 (ref 5.0–8.0)

## 2015-04-08 LAB — LIPASE, BLOOD: Lipase: 33 U/L (ref 11–51)

## 2015-04-08 MED ORDER — SODIUM CHLORIDE 0.9 % IV BOLUS (SEPSIS)
1000.0000 mL | Freq: Once | INTRAVENOUS | Status: AC
  Administered 2015-04-08: 1000 mL via INTRAVENOUS

## 2015-04-08 MED ORDER — DIPHENHYDRAMINE HCL 50 MG/ML IJ SOLN
25.0000 mg | Freq: Once | INTRAMUSCULAR | Status: AC
  Administered 2015-04-08: 25 mg via INTRAVENOUS
  Filled 2015-04-08: qty 1

## 2015-04-08 MED ORDER — PANTOPRAZOLE SODIUM 20 MG PO TBEC: 20.0000 mg | DELAYED_RELEASE_TABLET | Freq: Every day | ORAL | Status: DC

## 2015-04-08 MED ORDER — PROMETHAZINE HCL 25 MG/ML IJ SOLN
25.0000 mg | Freq: Once | INTRAMUSCULAR | Status: AC
  Administered 2015-04-08: 25 mg via INTRAVENOUS
  Filled 2015-04-08: qty 1

## 2015-04-08 MED ORDER — HYDROMORPHONE HCL 1 MG/ML IJ SOLN
1.0000 mg | Freq: Once | INTRAMUSCULAR | Status: AC
  Administered 2015-04-08: 1 mg via INTRAVENOUS
  Filled 2015-04-08: qty 1

## 2015-04-08 MED ORDER — PROMETHAZINE HCL 25 MG/ML IJ SOLN
12.5000 mg | Freq: Once | INTRAMUSCULAR | Status: AC
  Administered 2015-04-08: 12.5 mg via INTRAVENOUS
  Filled 2015-04-08: qty 1

## 2015-04-08 MED ORDER — ONDANSETRON 4 MG PO TBDP: ORAL_TABLET | ORAL | Status: DC

## 2015-04-08 MED ORDER — MORPHINE SULFATE (PF) 4 MG/ML IV SOLN
4.0000 mg | Freq: Once | INTRAVENOUS | Status: AC
  Administered 2015-04-08: 4 mg via INTRAVENOUS
  Filled 2015-04-08: qty 1

## 2015-04-08 NOTE — Discharge Instructions (Signed)
Follow up with your md tomorrow as planned 

## 2015-04-08 NOTE — ED Notes (Signed)
Having abdominal pain since last Wednesday, was here in ED on Monday for treatment.  C/o vomiting and fever at home.  Took tylenol for fever.

## 2015-04-10 NOTE — ED Provider Notes (Signed)
CSN: [id number]     Arrival date & time 04/08/15  1658 History   First MD Initiated Contact with Patient 04/08/15 2015     Chief Complaint  Patient presents with  . Abdominal Pain     (Consider location/radiation/quality/duration/timing/severity/associated sxs/prior Treatment) Patient is a 30 y.o. female presenting with abdominal pain. The history is provided by the patient (Patient complains of abdominal pain and vomiting).  Abdominal Pain Pain location:  Generalized Pain quality: aching   Pain radiates to:  Does not radiate Pain severity:  Moderate Onset quality:  Gradual Timing:  Constant Progression:  Waxing and waning Chronicity:  Recurrent Context: not alcohol use   Associated symptoms: no chest pain, no cough, no diarrhea, no fatigue and no hematuria     Past Medical History  Diagnosis Date  . Hypertension   . Asthma   . Pelvic inflammatory disease (PID)   . Sickle cell disease (HCC)   . Fibromyalgia   . Chronic abdominal pain   . Migraine    Past Surgical History  Procedure Laterality Date  . Tubal ligation    . Kidney infections    . Wisdom tooth extraction     Family History  Problem Relation Age of Onset  . Hypertension Mother   . Diabetes Mother   . Heart failure Father    Social History  Substance Use Topics  . Smoking status: Former Smoker -- 0.50 packs/day    Types: Cigarettes    Quit date: 03/18/2015  . Smokeless tobacco: Never Used     Comment: Occasional smoker  . Alcohol Use: Yes     Comment: occasionally-once a month   OB History    Gravida Para Term Preterm AB TAB SAB Ectopic Multiple Living   4 4 1 3      4      Review of Systems  Constitutional: Negative for appetite change and fatigue.  HENT: Negative for congestion, ear discharge and sinus pressure.   Eyes: Negative for discharge.  Respiratory: Negative for cough.   Cardiovascular: Negative for chest pain.  Gastrointestinal: Positive for abdominal pain. Negative for diarrhea.   Genitourinary: Negative for frequency and hematuria.  Musculoskeletal: Negative for back pain.  Skin: Negative for rash.  Neurological: Negative for seizures and headaches.  Psychiatric/Behavioral: Negative for hallucinations.      Allergies  Metoclopramide; Tomato; Aspirin; Doxycycline; Hydromorphone hcl; Ibuprofen; Ondansetron; Other; Shellfish allergy; Toradol; Latex; and Tramadol  Home Medications   Prior to Admission medications   Medication Sig Start Date End Date Taking? Authorizing Provider  amitriptyline (ELAVIL) 100 MG tablet Take 1 tablet (100 mg total) by mouth at bedtime. 01/13/15  Yes 01/15/15, MD  atenolol (TENORMIN) 50 MG tablet Take 50 mg by mouth daily. Reported on 01/13/2015 12/18/14  Yes Historical Provider, MD  folic acid (FOLVITE) 1 MG tablet Take 1 tablet (1 mg total) by mouth daily. 10/17/14  Yes 10/19/14, MD  gabapentin (NEURONTIN) 300 MG capsule Take 1 capsule (300 mg total) by mouth 3 (three) times daily. Patient taking differently: Take 600 mg by mouth 3 (three) times daily.  11/18/14  Yes 13/1/16, MD  rOPINIRole (REQUIP) 0.25 MG tablet Take 0.25 mg by mouth at bedtime.   Yes Historical Provider, MD  traZODone (DESYREL) 100 MG tablet Take 100-200 mg by mouth at bedtime.   Yes Historical Provider, MD  albuterol (PROVENTIL HFA;VENTOLIN HFA) 108 (90 BASE) MCG/ACT inhaler Inhale 2 puffs into the lungs every 6 (six) hours as needed  for wheezing or shortness of breath. Reported on 01/13/2015 01/14/12   Consuello Masse, MD  butalbital-acetaminophen-caffeine (FIORICET, ESGIC) [phone] MG tablet Take 1-2 tablets by mouth every 6 (six) hours as needed. headache 10/17/14   Historical Provider, MD  cloNIDine (CATAPRES) 0.1 MG tablet Take 1 tablet (0.1 mg total) by mouth every 8 (eight) hours as needed (muscle aches, withdrawl symptoms). Patient not taking: Reported on 04/06/2015 01/23/15   Azalia Bilis, MD  clotrimazole [facility]) 10 MG troche Take 1 tablet  (10 mg total) by mouth 5 (five) times daily. Patient not taking: Reported on 04/06/2015 02/26/15   Bethann Berkshire, MD  cyclobenzaprine (FLEXERIL) 10 MG tablet Take 1 tablet (10 mg total) by mouth 3 (three) times daily as needed for muscle spasms. Patient not taking: Reported on 04/06/2015 02/18/15   Devoria Albe, MD  ondansetron (ZOFRAN ODT) 4 MG disintegrating tablet 4mg  ODT q4 hours prn nausea/vomit 04/08/15   04/10/15, MD  oxyCODONE-acetaminophen (PERCOCET/ROXICET) 5-325 MG tablet Take 1 tablet by mouth every 6 (six) hours as needed. Patient not taking: Reported on 04/06/2015 02/26/15   04/26/15, MD  pantoprazole (PROTONIX) 20 MG tablet Take 1 tablet (20 mg total) by mouth daily. 04/08/15   04/10/15, MD  predniSONE (DELTASONE) 20 MG tablet Take 3 po QD x 3d , then 2 po QD x 3d then 1 po QD x 3d Patient not taking: Reported on 04/06/2015 02/18/15   04/18/15, MD  promethazine (PHENERGAN) 25 MG suppository Place 1 suppository (25 mg total) rectally every 6 (six) hours as needed for nausea or vomiting. 04/06/15   04/08/15, PA-C  promethazine (PHENERGAN) 6.25 MG/5ML syrup Take 20 mLs (25 mg total) by mouth every 6 (six) hours as needed for nausea or vomiting. 04/06/15   04/08/15, PA-C  tapentadol (NUCYNTA) 50 MG TABS tablet Take 50 mg by mouth every 8 (eight) hours as needed for moderate pain.  01/30/15   Historical Provider, MD   BP 133/80 mmHg  Pulse 98  Temp(Src) 98.1 F (36.7 C) (Oral)  Resp 16  Ht 5' (1.524 m)  Wt 175 lb (79.379 kg)  BMI 34.18 kg/m2  SpO2 98%  LMP 04/04/2015 Physical Exam  Constitutional: She is oriented to person, place, and time. She appears well-developed.  HENT:  Head: Normocephalic.  Eyes: Conjunctivae and EOM are normal. No scleral icterus.  Neck: Neck supple. No thyromegaly present.  Cardiovascular: Normal rate and regular rhythm.  Exam reveals no gallop and no friction rub.   No murmur heard. Pulmonary/Chest: No stridor. She has no wheezes. She has  no rales. She exhibits no tenderness.  Abdominal: She exhibits no distension. There is tenderness. There is no rebound.  Mild tenderness to abdomen  Musculoskeletal: Normal range of motion. She exhibits no edema.  Lymphadenopathy:    She has no cervical adenopathy.  Neurological: She is oriented to person, place, and time. She exhibits normal muscle tone. Coordination normal.  Skin: No rash noted. No erythema.  Psychiatric: She has a normal mood and affect. Her behavior is normal.    ED Course  Procedures (including critical care time) Labs Review Labs Reviewed  CBC WITH DIFFERENTIAL/PLATELET - Abnormal; Notable for the following:    WBC 11.1 (*)    Lymphs Abs 4.7 (*)    All other components within normal limits  COMPREHENSIVE METABOLIC PANEL - Abnormal; Notable for the following:    Calcium 8.7 (*)    ALT 13 (*)    All  other components within normal limits  LIPASE, BLOOD  URINALYSIS, ROUTINE W REFLEX MICROSCOPIC (NOT AT [facility])    Imaging Review Ct Renal Stone Study  04/08/2015  CLINICAL DATA:  Generalized body aches and pains with nausea and vomiting. EXAM: CT ABDOMEN AND PELVIS WITHOUT CONTRAST TECHNIQUE: Multidetector CT imaging of the abdomen and pelvis was performed following the standard protocol without IV contrast. COMPARISON:  10/10/2014 FINDINGS: The lung bases are clear. Evaluation of solid organs and vascular structures is limited without IV contrast material. Evaluation of bowel structures is limited without oral contrast material. The unenhanced appearance of the liver, spleen, gallbladder, pancreas, adrenal glands, abdominal aorta, inferior vena cava, and retroperitoneal lymph nodes is unremarkable. Stomach, small bowel, and colon are decompressed. Scattered stool in the colon. Minimal periumbilical hernia containing fat. No free air or free fluid in the abdomen. Pelvis: Right thumb was hypertrophy of the ileocecal valve. Appendix is segmentally identified and appears  normal. There a few prominent lymph nodes in the right lower quadrant which are not pathologically enlarged and are likely reactive. Uterus and ovaries are not enlarged. Bladder wall is not thickened. No free or loculated pelvic fluid collections. No evidence of diverticulitis. No destructive bone lesions. IMPRESSION: No acute process demonstrated in the abdomen and pelvis on noncontrast imaging. No evidence of bowel obstruction. Electronically Signed   By: Burman Nieves M.D.   On: 04/08/2015 21:41   I have personally reviewed and evaluated these images and lab results as part of my medical decision-making.   EKG Interpretation None      MDM   Final diagnoses:  Pain  Abdominal pain in female    Patient with normal labs and normal renal CT. Suspect exacerbation of chronic abdominal discomfort. Patient will be referred back to her family doctor    Bethann Berkshire, MD 04/10/15 1328

## 2015-04-23 ENCOUNTER — Emergency Department (HOSPITAL_COMMUNITY): Payer: Medicaid Other

## 2015-04-23 ENCOUNTER — Emergency Department (HOSPITAL_COMMUNITY)
Admission: EM | Admit: 2015-04-23 | Discharge: 2015-04-23 | Disposition: A | Discharge status: Home / Self Care | Payer: Medicaid Other | Attending: Emergency Medicine | Admitting: Emergency Medicine

## 2015-04-23 ENCOUNTER — Encounter (HOSPITAL_COMMUNITY): Payer: Self-pay | Admitting: *Deleted

## 2015-04-23 DIAGNOSIS — Z79899 Other long term (current) drug therapy: Secondary | ICD-10-CM | POA: Insufficient documentation

## 2015-04-23 DIAGNOSIS — M549 Dorsalgia, unspecified: Secondary | ICD-10-CM | POA: Diagnosis present

## 2015-04-23 DIAGNOSIS — Z87891 Personal history of nicotine dependence: Secondary | ICD-10-CM | POA: Diagnosis not present

## 2015-04-23 DIAGNOSIS — I10 Essential (primary) hypertension: Secondary | ICD-10-CM | POA: Insufficient documentation

## 2015-04-23 DIAGNOSIS — J45909 Unspecified asthma, uncomplicated: Secondary | ICD-10-CM | POA: Insufficient documentation

## 2015-04-23 DIAGNOSIS — M542 Cervicalgia: Secondary | ICD-10-CM | POA: Diagnosis not present

## 2015-04-23 LAB — COMPREHENSIVE METABOLIC PANEL
ALBUMIN: 4 g/dL (ref 3.5–5.0)
ALT: 11 U/L — AB (ref 14–54)
AST: 20 U/L (ref 15–41)
Alkaline Phosphatase: 44 U/L (ref 38–126)
Anion gap: 10 (ref 5–15)
BUN: 5 mg/dL — ABNORMAL LOW (ref 6–20)
CHLORIDE: 111 mmol/L (ref 101–111)
CO2: 18 mmol/L — AB (ref 22–32)
Calcium: 8.6 mg/dL — ABNORMAL LOW (ref 8.9–10.3)
Creatinine, Ser: 0.54 mg/dL (ref 0.44–1.00)
GFR calc non Af Amer: >60 mL/min (ref >60)
GLUCOSE: 88 mg/dL (ref 65–99)
Potassium: 3.9 mmol/L (ref 3.5–5.1)
SODIUM: 139 mmol/L (ref 135–145)
Total Bilirubin: 0.5 mg/dL (ref 0.3–1.2)
Total Protein: 7.3 g/dL (ref 6.5–8.1)

## 2015-04-23 LAB — CBC WITH DIFFERENTIAL/PLATELET
BASOS ABS: 0 10*3/uL (ref 0.0–0.1)
BASOS PCT: 1 %
EOS ABS: 0.2 10*3/uL (ref 0.0–0.7)
EOS PCT: 3 %
HCT: 36.6 % (ref 36.0–46.0)
HEMOGLOBIN: 12.8 g/dL (ref 12.0–15.0)
Lymphocytes Relative: 34 %
Lymphs Abs: 2.8 10*3/uL (ref 0.7–4.0)
MCH: 28.2 pg (ref 26.0–34.0)
MCHC: 35 g/dL (ref 30.0–36.0)
MCV: 80.6 fL (ref 78.0–100.0)
Monocytes Absolute: 0.8 10*3/uL (ref 0.1–1.0)
Monocytes Relative: 10 %
NEUTROS PCT: 53 %
Neutro Abs: 4.3 10*3/uL (ref 1.7–7.7)
PLATELETS: 147 10*3/uL — AB (ref 150–400)
RBC: 4.54 MIL/uL (ref 3.87–5.11)
RDW: 13.2 % (ref 11.5–15.5)
WBC: 8.5 10*3/uL (ref 4.0–10.5)

## 2015-04-23 LAB — LACTIC ACID, PLASMA: Lactic Acid, Venous: 1.1 mmol/L (ref 0.5–2.0)

## 2015-04-23 MED ORDER — MORPHINE SULFATE (PF) 4 MG/ML IV SOLN
8.0000 mg | Freq: Once | INTRAVENOUS | Status: AC
  Administered 2015-04-23: 8 mg via INTRAVENOUS
  Filled 2015-04-23: qty 2

## 2015-04-23 MED ORDER — LORAZEPAM 2 MG/ML IJ SOLN
1.0000 mg | Freq: Once | INTRAMUSCULAR | Status: AC
  Administered 2015-04-23: 1 mg via INTRAVENOUS
  Filled 2015-04-23: qty 1

## 2015-04-23 MED ORDER — ONDANSETRON HCL 4 MG/2ML IJ SOLN
INTRAMUSCULAR | Status: AC
  Filled 2015-04-23: qty 2

## 2015-04-23 MED ORDER — MORPHINE SULFATE (PF) 4 MG/ML IV SOLN
4.0000 mg | Freq: Once | INTRAVENOUS | Status: AC
  Administered 2015-04-23: 4 mg via INTRAVENOUS
  Filled 2015-04-23: qty 1

## 2015-04-23 MED ORDER — OXYCODONE-ACETAMINOPHEN 5-325 MG PO TABS
2.0000 | ORAL_TABLET | Freq: Once | ORAL | Status: AC
  Administered 2015-04-23: 2 via ORAL
  Filled 2015-04-23: qty 2

## 2015-04-23 MED ORDER — SODIUM CHLORIDE 0.9 % IV BOLUS (SEPSIS)
1000.0000 mL | Freq: Once | INTRAVENOUS | Status: AC
  Administered 2015-04-23: 1000 mL via INTRAVENOUS
  Filled 2015-04-23: qty 1000

## 2015-04-23 MED ORDER — OXYCODONE-ACETAMINOPHEN 5-325 MG PO TABS: 1.0000 | ORAL_TABLET | Freq: Four times a day (QID) | ORAL | Status: DC | PRN

## 2015-04-23 MED ORDER — ONDANSETRON 4 MG PO TBDP: ORAL_TABLET | ORAL | Status: DC

## 2015-04-23 MED ORDER — PROMETHAZINE HCL 25 MG/ML IJ SOLN
INTRAMUSCULAR | Status: AC
  Administered 2015-04-23: 12.5 mg
  Filled 2015-04-23: qty 1

## 2015-04-23 NOTE — Discharge Instructions (Signed)
Follow up with your family md next week for recheck.   Follow up with dr. Lovell Sheehan [facility]

## 2015-04-23 NOTE — ED Notes (Signed)
In radiology

## 2015-04-23 NOTE — ED Provider Notes (Signed)
CSN: [id number]     Arrival date & time 04/23/15  1309 History   First MD Initiated Contact with Patient 04/23/15 1316     Chief Complaint  Patient presents with  . Back Pain     (Consider location/radiation/quality/duration/timing/severity/associated sxs/prior Treatment) Patient is a 30 y.o. female presenting with back pain. The history is provided by the patient (Patient complains of neck pain and upper back pain last few days. She came from the pain center yesterday and is to be followed up by them in a couple weeks).  Back Pain Pain location: Patient hurts the back of her neck and upper part of her back. Quality:  Aching Radiates to: Left arm. Pain severity:  Moderate Pain is:  Worse during the day Onset quality:  Gradual Timing:  Constant Progression:  Waxing and waning Chronicity:  Recurrent Context: emotional stress   Associated symptoms: no abdominal pain, no chest pain and no headaches     Past Medical History  Diagnosis Date  . Hypertension   . Asthma   . Pelvic inflammatory disease (PID)   . Sickle cell disease (HCC)   . Fibromyalgia   . Chronic abdominal pain   . Migraine    Past Surgical History  Procedure Laterality Date  . Tubal ligation    . Kidney infections    . Wisdom tooth extraction     Family History  Problem Relation Age of Onset  . Hypertension Mother   . Diabetes Mother   . Heart failure Father    Social History  Substance Use Topics  . Smoking status: Former Smoker -- 0.50 packs/day    Types: Cigarettes    Quit date: 03/18/2015  . Smokeless tobacco: Never Used     Comment: Occasional smoker  . Alcohol Use: Yes     Comment: occasionally-once a month   OB History    Gravida Para Term Preterm AB TAB SAB Ectopic Multiple Living   4 4 1 3      4      Review of Systems  Constitutional: Negative for appetite change and fatigue.  HENT: Negative for congestion, ear discharge and sinus pressure.        Neck pain  Eyes: Negative for  discharge.  Respiratory: Negative for cough.   Cardiovascular: Negative for chest pain.  Gastrointestinal: Negative for abdominal pain and diarrhea.  Genitourinary: Negative for frequency and hematuria.  Musculoskeletal: Positive for back pain.  Skin: Negative for rash.  Neurological: Negative for seizures and headaches.       Back pain  Psychiatric/Behavioral: Negative for hallucinations.      Allergies  Metoclopramide; Tomato; Aspirin; Doxycycline; Hydromorphone hcl; Ibuprofen; Ondansetron; Other; Shellfish allergy; Toradol; Latex; and Tramadol  Home Medications   Prior to Admission medications   Medication Sig Start Date End Date Taking? Authorizing Provider  acetaminophen-codeine (TYLENOL #4) 300-60 MG tablet Take 1 tablet by mouth every 4 (four) hours as needed for moderate pain.   Yes Historical Provider, MD  albuterol (PROVENTIL HFA;VENTOLIN HFA) 108 (90 BASE) MCG/ACT inhaler Inhale 2 puffs into the lungs every 6 (six) hours as needed for wheezing or shortness of breath. Reported on 01/13/2015 01/14/12  Yes 01/16/12, MD  atenolol (TENORMIN) 50 MG tablet Take 50 mg by mouth daily. Reported on 01/13/2015 12/18/14  Yes Historical Provider, MD  butalbital-acetaminophen-caffeine (FIORICET, ESGIC) 50-325-40 MG tablet Take 1-2 tablets by mouth every 6 (six) hours as needed. headache 10/17/14  Yes Historical Provider, MD  DULoxetine (CYMBALTA) 60 MG  capsule Take 60 mg by mouth daily.   Yes Historical Provider, MD  folic acid (FOLVITE) 1 MG tablet Take 1 tablet (1 mg total) by mouth daily. 10/17/14  Yes Jerald Kief, MD  gabapentin (NEURONTIN) 300 MG capsule Take 1 capsule (300 mg total) by mouth 3 (three) times daily. Patient taking differently: Take 600 mg by mouth 3 (three) times daily.  11/18/14  Yes Jaclyn Shaggy, MD  hydrOXYzine (VISTARIL) 25 MG capsule Take 25 mg by mouth 3 (three) times daily as needed.   Yes Historical Provider, MD  promethazine (PHENERGAN) 6.25 MG/5ML syrup  Take 20 mLs (25 mg total) by mouth every 6 (six) hours as needed for nausea or vomiting. 04/06/15  Yes Waylan Boga Law, PA-C  rOPINIRole (REQUIP) 0.25 MG tablet Take 0.25 mg by mouth at bedtime.   Yes Historical Provider, MD  traZODone (DESYREL) 100 MG tablet Take 100-200 mg by mouth at bedtime.   Yes Historical Provider, MD  cloNIDine (CATAPRES) 0.1 MG tablet Take 1 tablet (0.1 mg total) by mouth every 8 (eight) hours as needed (muscle aches, withdrawl symptoms). Patient not taking: Reported on 04/06/2015 01/23/15   Azalia Bilis, MD  clotrimazole [facility]) 10 MG troche Take 1 tablet (10 mg total) by mouth 5 (five) times daily. Patient not taking: Reported on 04/06/2015 02/26/15   Bethann Berkshire, MD  cyclobenzaprine (FLEXERIL) 10 MG tablet Take 1 tablet (10 mg total) by mouth 3 (three) times daily as needed for muscle spasms. Patient not taking: Reported on 04/06/2015 02/18/15   Devoria Albe, MD  ondansetron (ZOFRAN ODT) 4 MG disintegrating tablet 4mg  ODT q4 hours prn nausea/vomit 04/23/15   06/23/15, MD  oxyCODONE-acetaminophen (PERCOCET/ROXICET) 5-325 MG tablet Take 1 tablet by mouth every 6 (six) hours as needed. 04/23/15   06/23/15, MD  pantoprazole (PROTONIX) 20 MG tablet Take 1 tablet (20 mg total) by mouth daily. Patient not taking: Reported on 04/23/2015 04/08/15   04/10/15, MD  predniSONE (DELTASONE) 20 MG tablet Take 3 po QD x 3d , then 2 po QD x 3d then 1 po QD x 3d Patient not taking: Reported on 04/06/2015 02/18/15   04/18/15, MD  promethazine (PHENERGAN) 25 MG suppository Place 1 suppository (25 mg total) rectally every 6 (six) hours as needed for nausea or vomiting. Patient not taking: Reported on 04/23/2015 04/06/15   04/08/15, PA-C  tapentadol (NUCYNTA) 50 MG TABS tablet Take 50 mg by mouth every 8 (eight) hours as needed for moderate pain.  01/30/15   Historical Provider, MD   BP 119/83 mmHg  Pulse 101  Temp(Src) 98.6 F (37 C) (Oral)  Resp 14  SpO2 100%  LMP  04/04/2015 Physical Exam  Constitutional: She is oriented to person, place, and time. She appears well-developed.  HENT:  Head: Normocephalic.  Tenderness to posterior neck  Eyes: Conjunctivae and EOM are normal. No scleral icterus.  Neck: Neck supple. No thyromegaly present.  Cardiovascular: Normal rate and regular rhythm.  Exam reveals no gallop and no friction rub.   No murmur heard. Pulmonary/Chest: No stridor. She has no wheezes. She has no rales. She exhibits no tenderness.  Abdominal: She exhibits no distension. There is no tenderness. There is no rebound.  Musculoskeletal: Normal range of motion. She exhibits no edema.  Patient attendance to upper thoracic  Lymphadenopathy:    She has no cervical adenopathy.  Neurological: She is oriented to person, place, and time. She exhibits normal muscle tone. Coordination normal.  Skin:  No rash noted. No erythema.  Psychiatric: She has a normal mood and affect. Her behavior is normal.    ED Course  Procedures (including critical care time) Labs Review Labs Reviewed  CBC WITH DIFFERENTIAL/PLATELET - Abnormal; Notable for the following:    Platelets 147 (*)    All other components within normal limits  COMPREHENSIVE METABOLIC PANEL - Abnormal; Notable for the following:    CO2 18 (*)    BUN 5 (*)    Calcium 8.6 (*)    ALT 11 (*)    All other components within normal limits  LACTIC ACID, PLASMA    Imaging Review Dg Chest 2 View  04/23/2015  CLINICAL DATA:  Patient reports from home for chronic back pain. EXAM: CHEST  2 VIEW COMPARISON:  02/18/2015 FINDINGS: The heart size and mediastinal contours are within normal limits. Both lungs are clear. The visualized skeletal structures are unremarkable. IMPRESSION: No active cardiopulmonary disease. Electronically Signed   By: Signa Kell M.D.   On: 04/23/2015 16:39   Mr Cervical Spine Wo Contrast  04/23/2015  CLINICAL DATA:  Severe neck pain with BILATERAL arm and leg numbness. Symptom  duration unspecified. EXAM: MRI CERVICAL SPINE WITHOUT CONTRAST TECHNIQUE: Multiplanar, multisequence MR imaging of the cervical spine was performed. No intravenous contrast was administered. COMPARISON:  None. FINDINGS: Significant motion degradation. Small or subtle abnormalities could be overlooked. Alignment: Reversal normal cervical lordotic curve.  No subluxation. Vertebrae: No worrisome osseous lesion. No compression fracture is observed. Cord: Cord flattening without abnormal cord signal greatest at C5-6 and C6-7. Posterior Fossa: No tonsillar herniation. Vertebral Arteries: Patent. Paraspinal tissues: Grossly unremarkable. Disc levels: The individual disc spaces were examined as follows: C2-3:  Normal. C3-4:  Normal. C4-5:  Mild bulge. C5-6:  Central protrusion.  Mild cord flattening. C6-7:  Central protrusion.  Mild cord flattening. C7-T1:  Normal. IMPRESSION: Significantly motion degraded exam demonstrating reversal of the normal cervical lordotic curve and spinal stenosis at C5-6 and C6-7. No acute abnormalities are detected. Electronically Signed   By: Elsie Stain M.D.   On: 04/23/2015 16:14   I have personally reviewed and evaluated these images and lab results as part of my medical decision-making.   EKG Interpretation None      MDM   Final diagnoses:  Neck pain    Patient had MRI of her neck that shows some mild central disc protrusion with mild cord flattening. I spoke with the radiologist who felt like this should not be causing significant symptoms. Patient has a history of chronic pain suspect a lot of her symptoms are related to her chronic pain issues. She will be referred to a neurosurgeon also referred back to her family doctor given some pain medicine and some nausea medicine    Bethann Berkshire, MD 04/23/15 1924

## 2015-04-23 NOTE — ED Notes (Signed)
EDP at bedside  

## 2015-04-23 NOTE — ED Notes (Signed)
Pt refusing to answer any other questions, pt only stating, "this pain, this pain, this pain."

## 2015-04-23 NOTE — ED Notes (Signed)
Patients visitor to desk asking if they patient can have pain medication through her IV before she leaves. Dr. Estell Harpin made aware.

## 2015-04-23 NOTE — ED Notes (Signed)
Pt comes in via EMS from home for chronic back pain. Pt crying upon triage and will not talk with the nurse at this time. Per EMS, pt was seen at a unknown facility yesterday and had tests done. Family nor pt able to provide any more information at this time.

## 2015-04-23 NOTE — ED Notes (Signed)
Assisted patient with bedpan 

## 2015-05-12 ENCOUNTER — Emergency Department (HOSPITAL_COMMUNITY): Payer: Medicaid Other

## 2015-05-12 ENCOUNTER — Encounter (HOSPITAL_COMMUNITY): Payer: Self-pay | Admitting: Emergency Medicine

## 2015-05-12 ENCOUNTER — Emergency Department (HOSPITAL_COMMUNITY)
Admission: EM | Admit: 2015-05-12 | Discharge: 2015-05-12 | Disposition: A | Discharge status: Home / Self Care | Payer: Medicaid Other | Attending: Emergency Medicine | Admitting: Emergency Medicine

## 2015-05-12 DIAGNOSIS — M2550 Pain in unspecified joint: Secondary | ICD-10-CM | POA: Insufficient documentation

## 2015-05-12 DIAGNOSIS — I10 Essential (primary) hypertension: Secondary | ICD-10-CM | POA: Insufficient documentation

## 2015-05-12 DIAGNOSIS — Z87891 Personal history of nicotine dependence: Secondary | ICD-10-CM | POA: Diagnosis not present

## 2015-05-12 DIAGNOSIS — R2689 Other abnormalities of gait and mobility: Secondary | ICD-10-CM | POA: Diagnosis not present

## 2015-05-12 DIAGNOSIS — R51 Headache: Secondary | ICD-10-CM | POA: Insufficient documentation

## 2015-05-12 DIAGNOSIS — M791 Myalgia: Secondary | ICD-10-CM | POA: Diagnosis not present

## 2015-05-12 DIAGNOSIS — F341 Dysthymic disorder: Secondary | ICD-10-CM | POA: Insufficient documentation

## 2015-05-12 DIAGNOSIS — J45909 Unspecified asthma, uncomplicated: Secondary | ICD-10-CM | POA: Insufficient documentation

## 2015-05-12 DIAGNOSIS — M549 Dorsalgia, unspecified: Secondary | ICD-10-CM | POA: Diagnosis not present

## 2015-05-12 DIAGNOSIS — R109 Unspecified abdominal pain: Secondary | ICD-10-CM | POA: Insufficient documentation

## 2015-05-12 DIAGNOSIS — Z79899 Other long term (current) drug therapy: Secondary | ICD-10-CM | POA: Diagnosis not present

## 2015-05-12 DIAGNOSIS — M542 Cervicalgia: Secondary | ICD-10-CM | POA: Insufficient documentation

## 2015-05-12 DIAGNOSIS — W19XXXA Unspecified fall, initial encounter: Secondary | ICD-10-CM

## 2015-05-12 MED ORDER — OXYCODONE-ACETAMINOPHEN 5-325 MG PO TABS
2.0000 | ORAL_TABLET | Freq: Once | ORAL | Status: AC
  Administered 2015-05-12: 2 via ORAL
  Filled 2015-05-12: qty 2

## 2015-05-12 MED ORDER — CYCLOBENZAPRINE HCL 10 MG PO TABS
10.0000 mg | ORAL_TABLET | Freq: Once | ORAL | Status: AC
  Administered 2015-05-12: 10 mg via ORAL
  Filled 2015-05-12: qty 1

## 2015-05-12 NOTE — ED Notes (Signed)
Pt requested emesis bag prior to taking medication.  Prior to taking medication (which was already scanned, pt c/o nausea).  Pt given emesis bag and tissue.  Pt then put meds in mouth, sipped water and spit meds (not sure if it was all, one seen one of the percocet)in emesis bag.  pt then placed dry tissue into emesis bag.  I requested the bag from pt and placed int he sharps container.

## 2015-05-12 NOTE — ED Notes (Signed)
PT c/o neck pain and went to pain clinic today and states she was told to come to ED for eval d/t HTN and swelling to neck. PT also states she fell while onto her left side yesterday while trying to get in the car.

## 2015-05-12 NOTE — ED Provider Notes (Signed)
CSN: [id number]     Arrival date & time 05/12/15  1342 History   First MD Initiated Contact with Patient 05/12/15 1345     Chief Complaint  Patient presents with  . Neck Pain    (Consider location/radiation/quality/duration/timing/severity/associated sxs/prior Treatment) Patient is a 30 y.o. female presenting with neck pain. The history is provided by the patient.  Neck Pain Associated symptoms: headaches   Associated symptoms: no fever   30 y/o woman with extensive chronic pain symptoms presenting with back and neck pain ongoing since Friday (4 days ago) now with a new fall. This pain is worst in the back of the neck and upper back and exquisitely tender to touch. It was worsened after a fall from standing in a parking lot today during which she caught herself on her leg and arm. She did not lose conciousness or strike her head during this fall She reports going to her pain clinic appointment today for steroid injections but on arrival was too tachycardic and hypertensive and was referred to the ED. She has had frequent ED evaluations for pain with 14 visits in the past 6 months, including recent visit with MRI of cervical spine several weeks ago.  Past Medical History  Diagnosis Date  . Hypertension   . Asthma   . Pelvic inflammatory disease (PID)   . Sickle cell disease (HCC)   . Fibromyalgia   . Chronic abdominal pain   . Migraine    Past Surgical History  Procedure Laterality Date  . Tubal ligation    . Kidney infections    . Wisdom tooth extraction     Family History  Problem Relation Age of Onset  . Hypertension Mother   . Diabetes Mother   . Heart failure Father    Social History  Substance Use Topics  . Smoking status: Former Smoker -- 0.50 packs/day    Types: Cigarettes    Quit date: 03/18/2015  . Smokeless tobacco: Never Used     Comment: Occasional smoker  . Alcohol Use: Yes     Comment: occasionally-once a month   OB History    Gravida Para Term Preterm AB  TAB SAB Ectopic Multiple Living   4 4 1 3      4      Review of Systems  Constitutional: Negative for fever.  HENT: Negative for facial swelling.   Eyes: Negative for visual disturbance.  Respiratory: Negative for chest tightness.   Cardiovascular: Negative for palpitations.  Gastrointestinal: Positive for abdominal pain.  Musculoskeletal: Positive for myalgias, back pain, arthralgias, gait problem, neck pain and neck stiffness. Negative for joint swelling.  Skin: Negative for wound.  Neurological: Positive for light-headedness and headaches.  Psychiatric/Behavioral: Positive for dysphoric mood. The patient is nervous/anxious.       Allergies  Metoclopramide; Tomato; Aspirin; Doxycycline; Hydromorphone hcl; Ibuprofen; Ondansetron; Other; Shellfish allergy; Toradol; Latex; and Tramadol  Home Medications   Prior to Admission medications   Medication Sig Start Date End Date Taking? Authorizing Provider  acetaminophen-codeine (TYLENOL #4) 300-60 MG tablet Take 1 tablet by mouth every 4 (four) hours as needed for moderate pain.    Historical Provider, MD  albuterol (PROVENTIL HFA;VENTOLIN HFA) 108 (90 BASE) MCG/ACT inhaler Inhale 2 puffs into the lungs every 6 (six) hours as needed for wheezing or shortness of breath. Reported on 01/13/2015 01/14/12   01/16/12, MD  atenolol (TENORMIN) 50 MG tablet Take 50 mg by mouth daily. Reported on 01/13/2015 12/18/14   Historical Provider,  MD  butalbital-acetaminophen-caffeine (FIORICET, ESGIC) 50-325-40 MG tablet Take 1-2 tablets by mouth every 6 (six) hours as needed. headache 10/17/14   Historical Provider, MD  cloNIDine (CATAPRES) 0.1 MG tablet Take 1 tablet (0.1 mg total) by mouth every 8 (eight) hours as needed (muscle aches, withdrawl symptoms). Patient not taking: Reported on 04/06/2015 01/23/15   Azalia Bilis, MD  clotrimazole [facility]) 10 MG troche Take 1 tablet (10 mg total) by mouth 5 (five) times daily. Patient not taking: Reported on  04/06/2015 02/26/15   Bethann Berkshire, MD  cyclobenzaprine (FLEXERIL) 10 MG tablet Take 1 tablet (10 mg total) by mouth 3 (three) times daily as needed for muscle spasms. Patient not taking: Reported on 04/06/2015 02/18/15   Devoria Albe, MD  DULoxetine (CYMBALTA) 60 MG capsule Take 60 mg by mouth daily.    Historical Provider, MD  folic acid (FOLVITE) 1 MG tablet Take 1 tablet (1 mg total) by mouth daily. 10/17/14   Jerald Kief, MD  gabapentin (NEURONTIN) 300 MG capsule Take 1 capsule (300 mg total) by mouth 3 (three) times daily. Patient taking differently: Take 600 mg by mouth 3 (three) times daily.  11/18/14   Jaclyn Shaggy, MD  hydrOXYzine (VISTARIL) 25 MG capsule Take 25 mg by mouth 3 (three) times daily as needed.    Historical Provider, MD  ondansetron (ZOFRAN ODT) 4 MG disintegrating tablet 4mg  ODT q4 hours prn nausea/vomit 04/23/15   06/23/15, MD  oxyCODONE-acetaminophen (PERCOCET/ROXICET) 5-325 MG tablet Take 1 tablet by mouth every 6 (six) hours as needed. 04/23/15   06/23/15, MD  pantoprazole (PROTONIX) 20 MG tablet Take 1 tablet (20 mg total) by mouth daily. Patient not taking: Reported on 04/23/2015 04/08/15   04/10/15, MD  predniSONE (DELTASONE) 20 MG tablet Take 3 po QD x 3d , then 2 po QD x 3d then 1 po QD x 3d Patient not taking: Reported on 04/06/2015 02/18/15   04/18/15, MD  promethazine (PHENERGAN) 25 MG suppository Place 1 suppository (25 mg total) rectally every 6 (six) hours as needed for nausea or vomiting. Patient not taking: Reported on 04/23/2015 04/06/15   04/08/15, PA-C  promethazine (PHENERGAN) 6.25 MG/5ML syrup Take 20 mLs (25 mg total) by mouth every 6 (six) hours as needed for nausea or vomiting. 04/06/15   04/08/15, PA-C  rOPINIRole (REQUIP) 0.25 MG tablet Take 0.25 mg by mouth at bedtime.    Historical Provider, MD  tapentadol (NUCYNTA) 50 MG TABS tablet Take 50 mg by mouth every 8 (eight) hours as needed for moderate pain.  01/30/15   Historical Provider,  MD  traZODone (DESYREL) 100 MG tablet Take 100-200 mg by mouth at bedtime.    Historical Provider, MD   BP 140/95 mmHg  Pulse 101  Temp(Src) 98.5 F (36.9 C) (Oral)  Resp 18  Ht 5' (1.524 m)  Wt 81.194 kg  BMI 34.96 kg/m2  SpO2 100%  LMP 04/28/2015 Physical Exam  GENERAL- alert, co-operative, NAD HEENT- Atraumatic, allodynia to very light touch over neck extensor muscles,CARDIAC- Tachycardic, regular rhythm, no murmurs, rubs or gallops. RESP- CTAB, no wheezes or crackles. BACK-  Exquisite tenderness to pressure over trapezius muscles, rhomboid, and paraspinals along thoracic spine, no deformity or edema NEURO- Weakness of LUE on exam however poor effort due to pain, sensation intact EXTREMITIES- pulse 2+, symmetric, no pedal edema. SKIN- Warm, dry, No rash or lesion. PSYCH- Appropriate thought content and speech.   ED Course  Procedures (including critical  care time) Labs Review Labs Reviewed - No data to display  Imaging Review Dg Cervical Spine Complete  05/12/2015  CLINICAL DATA:  Neck and left arm pain after fall yesterday. EXAM: CERVICAL SPINE - COMPLETE 4+ VIEW COMPARISON:  MRI of April 23, 2015. FINDINGS: There is no evidence of cervical spine fracture or prevertebral soft tissue swelling. Alignment is normal. No other significant bone abnormalities are identified. IMPRESSION: Negative cervical spine radiographs. Electronically Signed   By: Lupita Raider, M.D.   On: 05/12/2015 15:19   I have personally reviewed and evaluated these images and lab results as part of my medical decision-making.   EKG Interpretation None      MDM   Final diagnoses:  Fall, initial encounter   Woman with extensive history of pain complaints now with a new fall and exacerbation of chronic neck and back pain. The mechanism described in her fall with no impact to her head or neck is very unlikely to have caused new acute fracture so only obtaining complete neck series on plain radiography  for screening. Pain treated here with oxycodone and flexeril which were previously prescribed as outpatient meds for acute pain, without plan for outpatient prescription as she is already established with a pain clinic for long term management. Radiographs were negative for ay acute fracture and exam remained nonspecific with very generalized pain. She had some nausea with taking medication in the ED, however does not appear significant dehydrated and was tolerating drinking provided water. Some behavior in the ED reported by staff was concerning for the patient trying to retain medication rather than take it as requested. I recommended she follow up with her pain specialty clinic or PCP for treatment of ongoing symptoms.  Fuller Plan, MD 05/12/15 1548  Blane Ohara, MD 05/13/15 [phone]

## 2015-05-12 NOTE — ED Notes (Signed)
Dr Dimple Casey informed of the pt spitting med into emesis bag.

## 2015-05-12 NOTE — ED Notes (Signed)
Pt has left Ed before receiving discharge instructions.

## 2015-05-14 ENCOUNTER — Emergency Department (HOSPITAL_COMMUNITY)
Admission: EM | Admit: 2015-05-14 | Discharge: 2015-05-14 | Disposition: A | Discharge status: Home / Self Care | Payer: Medicaid Other | Attending: Emergency Medicine | Admitting: Emergency Medicine

## 2015-05-14 ENCOUNTER — Encounter (HOSPITAL_COMMUNITY): Payer: Self-pay | Admitting: Cardiology

## 2015-05-14 DIAGNOSIS — Z87891 Personal history of nicotine dependence: Secondary | ICD-10-CM | POA: Diagnosis not present

## 2015-05-14 DIAGNOSIS — I10 Essential (primary) hypertension: Secondary | ICD-10-CM | POA: Insufficient documentation

## 2015-05-14 DIAGNOSIS — M549 Dorsalgia, unspecified: Secondary | ICD-10-CM | POA: Diagnosis not present

## 2015-05-14 DIAGNOSIS — R112 Nausea with vomiting, unspecified: Secondary | ICD-10-CM | POA: Diagnosis not present

## 2015-05-14 DIAGNOSIS — Z79899 Other long term (current) drug therapy: Secondary | ICD-10-CM | POA: Diagnosis not present

## 2015-05-14 DIAGNOSIS — R197 Diarrhea, unspecified: Secondary | ICD-10-CM | POA: Diagnosis not present

## 2015-05-14 DIAGNOSIS — J45909 Unspecified asthma, uncomplicated: Secondary | ICD-10-CM | POA: Insufficient documentation

## 2015-05-14 DIAGNOSIS — G8929 Other chronic pain: Secondary | ICD-10-CM

## 2015-05-14 DIAGNOSIS — M542 Cervicalgia: Secondary | ICD-10-CM | POA: Insufficient documentation

## 2015-05-14 LAB — CBC
HCT: 36.5 % (ref 36.0–46.0)
Hemoglobin: 12.8 g/dL (ref 12.0–15.0)
MCH: 28.4 pg (ref 26.0–34.0)
MCHC: 35.1 g/dL (ref 30.0–36.0)
MCV: 80.9 fL (ref 78.0–100.0)
Platelets: 260 10*3/uL (ref 150–400)
RBC: 4.51 MIL/uL (ref 3.87–5.11)
RDW: 13.3 % (ref 11.5–15.5)
WBC: 7.9 10*3/uL (ref 4.0–10.5)

## 2015-05-14 LAB — COMPREHENSIVE METABOLIC PANEL
ALT: 12 U/L — ABNORMAL LOW (ref 14–54)
AST: 17 U/L (ref 15–41)
Albumin: 3.8 g/dL (ref 3.5–5.0)
Alkaline Phosphatase: 46 U/L (ref 38–126)
Anion gap: 7 (ref 5–15)
BUN: 6 mg/dL (ref 6–20)
CO2: 24 mmol/L (ref 22–32)
Calcium: 9.1 mg/dL (ref 8.9–10.3)
Chloride: 107 mmol/L (ref 101–111)
Creatinine, Ser: 0.66 mg/dL (ref 0.44–1.00)
GFR calc Af Amer: >60 mL/min (ref >60)
GFR calc non Af Amer: >60 mL/min (ref >60)
Glucose, Bld: 99 mg/dL (ref 65–99)
Potassium: 3.8 mmol/L (ref 3.5–5.1)
Sodium: 138 mmol/L (ref 135–145)
Total Bilirubin: 0.3 mg/dL (ref 0.3–1.2)
Total Protein: 7.2 g/dL (ref 6.5–8.1)

## 2015-05-14 MED ORDER — PROMETHAZINE HCL 12.5 MG PO TABS
25.0000 mg | ORAL_TABLET | Freq: Once | ORAL | Status: AC
  Administered 2015-05-14: 25 mg via ORAL
  Filled 2015-05-14: qty 2

## 2015-05-14 MED ORDER — OXYCODONE-ACETAMINOPHEN 5-325 MG PO TABS
2.0000 | ORAL_TABLET | Freq: Once | ORAL | Status: AC
  Administered 2015-05-14: 2 via ORAL
  Filled 2015-05-14: qty 2

## 2015-05-14 MED ORDER — PROMETHAZINE HCL 25 MG RE SUPP: 25.0000 mg | Freq: Four times a day (QID) | RECTAL | Status: DC | PRN

## 2015-05-14 MED ORDER — DICYCLOMINE HCL 10 MG PO CAPS
10.0000 mg | ORAL_CAPSULE | Freq: Once | ORAL | Status: AC
  Administered 2015-05-14: 10 mg via ORAL
  Filled 2015-05-14: qty 1

## 2015-05-14 NOTE — ED Provider Notes (Signed)
CSN: [id number]     Arrival date & time 05/14/15  1127 History  By signing my name below, I, Tanya Krause, attest that this documentation has been prepared under the direction and in the presence of Raeford Razor, MD. Electronically Signed: Ronney Krause, ED Scribe. 05/14/2015. 12:41 PM.   Chief Complaint  Patient presents with  . Emesis   The history is provided by the patient. No language interpreter was used.   HPI Comments: Tanya Krause is a 30 y.o. female with a history of sickle cell, fibromyalgia, chronic abdominal pain, and asthma, who presents to the Emergency Department with multiple complaints, including nausea and vomiting that began 1-2 days ago. She also complains of associated generalized abdominal pain diarrhea, and a fever of 100.2 measured at home yesterday. She denies any known sick contact. She denies SOB.  Patient also complains of ongoing neck and back pain since being seen here 2 days ago S/P a slip and fall in a parking lot. Patient states she slipped while using her walker 2 days ago when it was raining. She reports she normally ambulates with a walker due to a Vitamin B and D deficiency. She states she had already had neck and back pain that was exacerbated by the fall. She states she had gone to pain management for injection in neck and back, but was sent here for x-rays due to her fall.   Past Medical History  Diagnosis Date  . Hypertension   . Asthma   . Pelvic inflammatory disease (PID)   . Sickle cell disease (HCC)   . Fibromyalgia   . Chronic abdominal pain   . Migraine    Past Surgical History  Procedure Laterality Date  . Tubal ligation    . Kidney infections    . Wisdom tooth extraction     Family History  Problem Relation Age of Onset  . Hypertension Mother   . Diabetes Mother   . Heart failure Father    Social History  Substance Use Topics  . Smoking status: Former Smoker -- 0.50 packs/day    Types: Cigarettes    Quit date: 03/18/2015  .  Smokeless tobacco: Never Used     Comment: Occasional smoker  . Alcohol Use: Yes     Comment: occasionally-once a month   OB History    Gravida Para Term Preterm AB TAB SAB Ectopic Multiple Living   4 4 1 3      4      Review of Systems  Constitutional: Positive for fever.  Respiratory: Negative for shortness of breath.   Gastrointestinal: Positive for nausea, vomiting, abdominal pain and diarrhea.  Musculoskeletal: Positive for back pain and neck pain.  All other systems reviewed and are negative.     Allergies  Metoclopramide; Tomato; Aspirin; Doxycycline; Hydromorphone hcl; Ibuprofen; Ondansetron; Other; Shellfish allergy; Toradol; Latex; and Tramadol  Home Medications   Prior to Admission medications   Medication Sig Start Date End Date Taking? Authorizing Provider  acetaminophen-codeine (TYLENOL #4) 300-60 MG tablet Take 1 tablet by mouth every 4 (four) hours as needed for moderate pain.    Historical Provider, MD  albuterol (PROVENTIL HFA;VENTOLIN HFA) 108 (90 BASE) MCG/ACT inhaler Inhale 2 puffs into the lungs every 6 (six) hours as needed for wheezing or shortness of breath. Reported on 01/13/2015 01/14/12   01/16/12, MD  atenolol (TENORMIN) 50 MG tablet Take 50 mg by mouth daily. Reported on 01/13/2015 12/18/14   Historical Provider, MD  butalbital-acetaminophen-caffeine (FIORICET,  ESGIC) 50-325-40 MG tablet Take 1-2 tablets by mouth every 6 (six) hours as needed. headache 10/17/14   Historical Provider, MD  cloNIDine (CATAPRES) 0.1 MG tablet Take 1 tablet (0.1 mg total) by mouth every 8 (eight) hours as needed (muscle aches, withdrawl symptoms). Patient not taking: Reported on 04/06/2015 01/23/15   Azalia Bilis, MD  clotrimazole [facility]) 10 MG troche Take 1 tablet (10 mg total) by mouth 5 (five) times daily. Patient not taking: Reported on 04/06/2015 02/26/15   Bethann Berkshire, MD  cyclobenzaprine (FLEXERIL) 10 MG tablet Take 1 tablet (10 mg total) by mouth 3 (three) times  daily as needed for muscle spasms. Patient not taking: Reported on 04/06/2015 02/18/15   Devoria Albe, MD  DULoxetine (CYMBALTA) 60 MG capsule Take 60 mg by mouth daily.    Historical Provider, MD  folic acid (FOLVITE) 1 MG tablet Take 1 tablet (1 mg total) by mouth daily. 10/17/14   Jerald Kief, MD  gabapentin (NEURONTIN) 300 MG capsule Take 1 capsule (300 mg total) by mouth 3 (three) times daily. Patient taking differently: Take 600 mg by mouth 3 (three) times daily.  11/18/14   Jaclyn Shaggy, MD  hydrOXYzine (VISTARIL) 25 MG capsule Take 25 mg by mouth 3 (three) times daily as needed.    Historical Provider, MD  ondansetron (ZOFRAN ODT) 4 MG disintegrating tablet 4mg  ODT q4 hours prn nausea/vomit 04/23/15   06/23/15, MD  oxyCODONE-acetaminophen (PERCOCET/ROXICET) 5-325 MG tablet Take 1 tablet by mouth every 6 (six) hours as needed. 04/23/15   06/23/15, MD  pantoprazole (PROTONIX) 20 MG tablet Take 1 tablet (20 mg total) by mouth daily. Patient not taking: Reported on 04/23/2015 04/08/15   04/10/15, MD  predniSONE (DELTASONE) 20 MG tablet Take 3 po QD x 3d , then 2 po QD x 3d then 1 po QD x 3d Patient not taking: Reported on 04/06/2015 02/18/15   04/18/15, MD  promethazine (PHENERGAN) 25 MG suppository Place 1 suppository (25 mg total) rectally every 6 (six) hours as needed for nausea or vomiting. Patient not taking: Reported on 04/23/2015 04/06/15   04/08/15, PA-C  promethazine (PHENERGAN) 6.25 MG/5ML syrup Take 20 mLs (25 mg total) by mouth every 6 (six) hours as needed for nausea or vomiting. 04/06/15   04/08/15, PA-C  rOPINIRole (REQUIP) 0.25 MG tablet Take 0.25 mg by mouth at bedtime.    Historical Provider, MD  tapentadol (NUCYNTA) 50 MG TABS tablet Take 50 mg by mouth every 8 (eight) hours as needed for moderate pain.  01/30/15   Historical Provider, MD  traZODone (DESYREL) 100 MG tablet Take 100-200 mg by mouth at bedtime.    Historical Provider, MD   BP 135/92 mmHg  Pulse 88   Temp(Src) 97.5 F (36.4 C) (Temporal)  Resp 18  Ht 5' (1.524 m)  Wt 179 lb (81.194 kg)  BMI 34.96 kg/m2  SpO2 100%  LMP 04/28/2015 Physical Exam  Constitutional: She is oriented to person, place, and time. She appears well-developed and well-nourished. No distress.  Laying in bed. Initially did not seems distressed. Began crying shortly after initiating conversation with her.   HENT:  Head: Normocephalic and atraumatic.  Eyes: Conjunctivae and EOM are normal.  Neck: Neck supple. No tracheal deviation present.  Cardiovascular: Normal rate.   Pulmonary/Chest: Effort normal. No respiratory distress.  Musculoskeletal: Normal range of motion.  Pt crying and pushing my hands away even with barely palpating skin over L neck, upper back  and shoulder. She was noted to change positions on bed prior to this and did not visibly react like this when this area in contact with bed. No obvious deformity or concerns lesions noted.   Neurological: She is alert and oriented to person, place, and time.  Skin: Skin is warm and dry.  Psychiatric: She has a normal mood and affect. Her behavior is normal.  Nursing note and vitals reviewed.   ED Course  Procedures (including critical care time)  DIAGNOSTIC STUDIES: Oxygen Saturation is 100% on RA, normal by my interpretation.    COORDINATION OF CARE: 12:42 PM - Discussed treatment plan with pt at bedside. Pt verbalized understanding and agreed to plan.   Labs Review Labs Reviewed  COMPREHENSIVE METABOLIC PANEL - Abnormal; Notable for the following:    ALT 12 (*)    All other components within normal limits  CBC    Imaging Review Dg Cervical Spine Complete  05/12/2015  CLINICAL DATA:  Neck and left arm pain after fall yesterday. EXAM: CERVICAL SPINE - COMPLETE 4+ VIEW COMPARISON:  MRI of April 23, 2015. FINDINGS: There is no evidence of cervical spine fracture or prevertebral soft tissue swelling. Alignment is normal. No other significant bone  abnormalities are identified. IMPRESSION: Negative cervical spine radiographs. Electronically Signed   By: Lupita Raider, M.D.   On: 05/12/2015 15:19   I have personally reviewed and evaluated these images and lab results as part of my medical decision-making.   EKG Interpretation None      MDM   Final diagnoses:  None     29yF with multiple complaints. Most related to chronic pain issues. Also recent fall with neck pain which she was evaluated for in ED 2 days ago. Denies interim trauma. N/v/d, body aches and sweating. This may potentially be opiate withdrawal or at least contributing. Her pain seems very out of proportion to her exam. She is also keenly interested in medication I was planning to give her, dosing and route of administration. Is in pain management. Review of [facility] database shows numerous controlled medications filled in various pharmacies from multiple providers.  Some concern for malingering. Regardless, I have a low suspicion for an emergent process. Pt unhappy, but I do not have objective basis to justify prescriptions for additional controlled medications at this time.  I personally preformed the services scribed in my presence. The recorded information has been reviewed is accurate. Raeford Razor, MD.     Raeford Razor, MD 05/20/15 1325

## 2015-05-14 NOTE — ED Notes (Signed)
Pt extremely hostile when this RN went to d/c patient. Dr. Juleen China at bedside initially explaining to patient the need to follow up with pain clinic. When attempting to go over d/c paperwork and prescriptions, pt refused prescription for phenergan stating, "I can't take that, it breaks my bottom out. Y'all should know that, it is in my chart." When attempting to get patient to sign for d/c, pt stated "I am not signing that. Y'all did not make me sign it last time I was here. You guys didn't do nothing for me." Pt then cursing at this RN as I was leaving the room. Security notified and is at bedside to escort patient out of ED.

## 2015-05-14 NOTE — ED Notes (Signed)
Pt refusing any d/c vitals.

## 2015-05-14 NOTE — ED Notes (Signed)
Seen here Tuesday after a fall.  Now c/o sweating,  Vomiting, diarrhea and left sided body pain.

## 2015-05-14 NOTE — Discharge Instructions (Signed)
Chronic Pain Chronic pain can be defined as pain that is off and on and lasts for 3-6 months or longer. Many things cause chronic pain, which can make it difficult to make a diagnosis. There are many treatment options available for chronic pain. However, finding a treatment that works well for you may require trying various approaches until the right one is found. Many people benefit from a combination of two or more types of treatment to control their pain. SYMPTOMS  Chronic pain can occur anywhere in the body and can range from mild to very severe. Some types of chronic pain include:  Headache.  Low back pain.  Cancer pain.  Arthritis pain.  Neurogenic pain. This is pain resulting from damage to nerves. People with chronic pain may also have other symptoms such as:  Depression.  Anger.  Insomnia.  Anxiety. DIAGNOSIS  Your health care provider will help diagnose your condition over time. In many cases, the initial focus will be on excluding possible conditions that could be causing the pain. Depending on your symptoms, your health care provider may order tests to diagnose your condition. Some of these tests may include:   Blood tests.   CT scan.   MRI.   X-rays.   Ultrasounds.   Nerve conduction studies.  You may need to see a specialist.  TREATMENT  Finding treatment that works well may take time. You may be referred to a pain specialist. He or she may prescribe medicine or therapies, such as:   Mindful meditation or yoga.  Shots (injections) of numbing or pain-relieving medicines into the spine or area of pain.  Local electrical stimulation.  Acupuncture.   Massage therapy.   Aroma, color, light, or sound therapy.   Biofeedback.   Working with a physical therapist to keep from getting stiff.   Regular, gentle exercise.   Cognitive or behavioral therapy.   Group support.  Sometimes, surgery may be recommended.  HOME CARE INSTRUCTIONS    Take all medicines as directed by your health care provider.   Lessen stress in your life by relaxing and doing things such as listening to calming music.   Exercise or be active as directed by your health care provider.   Eat a healthy diet and include things such as vegetables, fruits, fish, and lean meats in your diet.   Keep all follow-up appointments with your health care provider.   Attend a support group with others suffering from chronic pain. SEEK MEDICAL CARE IF:   Your pain gets worse.   You develop a new pain that was not there before.   You cannot tolerate medicines given to you by your health care provider.   You have new symptoms since your last visit with your health care provider.  SEEK IMMEDIATE MEDICAL CARE IF:   You feel weak.   You have decreased sensation or numbness.   You lose control of bowel or bladder function.   Your pain suddenly gets much worse.   You develop shaking.  You develop chills.  You develop confusion.  You develop chest pain.  You develop shortness of breath.  MAKE SURE Y Nausea and Vomiting Nausea means you feel sick to your stomach. Throwing up (vomiting) is a reflex where stomach contents come out of your mouth. HOME CARE   Take medicine as told by your doctor.  Do not force yourself to eat. However, you do need to drink fluids.  If you feel like eating, eat a normal diet  as told by your doctor.  Eat rice, wheat, potatoes, bread, lean meats, yogurt, fruits, and vegetables.  Avoid high-fat foods.  Drink enough fluids to keep your pee (urine) clear or pale yellow.  Ask your doctor how to replace body fluid losses (rehydrate). Signs of body fluid loss (dehydration) include:  Feeling very thirsty.  Dry lips and mouth.  Feeling dizzy.  Dark pee.  Peeing less than normal.  Feeling confused.  Fast breathing or heart rate. GET HELP RIGHT AWAY IF:   You have blood in your throw up.  You  have black or bloody poop (stool).  You have a bad headache or stiff neck.  You feel confused.  You have bad belly (abdominal) pain.  You have chest pain or trouble breathing.  You do not pee at least once every 8 hours.  You have cold, clammy skin.  You keep throwing up after 24 to 48 hours.  You have a fever. MAKE SURE YOU:   Understand these instructions.  Will watch your condition.  Will get help right away if you are not doing well or get worse.   This information is not intended to replace advice given to you by your health care provider. Make sure you discuss any questions you have with your health care provider.   Document Released: 06/22/2007 Document Revised: 03/28/2011 Document Reviewed: 06/04/2010 Elsevier Interactive Patient Education 2016 [employer] OU:  Understand these instructions.  Will watch your condition.  Will get help right away if you are not doing well or get worse.   This information is not intended to replace advice given to you by your health care provider. Make sure you discuss any questions you have with your health care provider.   Document Released: 09/25/2001 Document Revised: 09/05/2012 Document Reviewed: 06/29/2012 Elsevier Interactive Patient Education [employer]

## 2015-05-14 NOTE — ED Notes (Signed)
Pt reportedly vomited. States she may have vomited up medication. No evidence of emesis noted. MD aware.

## 2015-05-19 ENCOUNTER — Encounter (HOSPITAL_COMMUNITY): Payer: Self-pay

## 2015-05-19 ENCOUNTER — Emergency Department (HOSPITAL_COMMUNITY)
Admission: EM | Admit: 2015-05-19 | Discharge: 2015-05-19 | Disposition: A | Discharge status: Home / Self Care | Payer: Medicaid Other | Attending: Emergency Medicine | Admitting: Emergency Medicine

## 2015-05-19 DIAGNOSIS — R51 Headache: Secondary | ICD-10-CM | POA: Insufficient documentation

## 2015-05-19 DIAGNOSIS — Z87891 Personal history of nicotine dependence: Secondary | ICD-10-CM | POA: Diagnosis not present

## 2015-05-19 DIAGNOSIS — I10 Essential (primary) hypertension: Secondary | ICD-10-CM | POA: Diagnosis not present

## 2015-05-19 DIAGNOSIS — R519 Headache, unspecified: Secondary | ICD-10-CM

## 2015-05-19 DIAGNOSIS — J45909 Unspecified asthma, uncomplicated: Secondary | ICD-10-CM | POA: Diagnosis not present

## 2015-05-19 HISTORY — DX: Other hemoglobinopathies: D58.2

## 2015-05-19 LAB — URINALYSIS, ROUTINE W REFLEX MICROSCOPIC
BILIRUBIN URINE: NEGATIVE
GLUCOSE, UA: NEGATIVE mg/dL
HGB URINE DIPSTICK: NEGATIVE
KETONES UR: NEGATIVE mg/dL
Leukocytes, UA: NEGATIVE
Nitrite: NEGATIVE
Protein, ur: NEGATIVE mg/dL
Specific Gravity, Urine: 1.025 (ref 1.005–1.030)
pH: 5.5 (ref 5.0–8.0)

## 2015-05-19 MED ORDER — DIPHENHYDRAMINE HCL 50 MG/ML IJ SOLN
25.0000 mg | Freq: Once | INTRAMUSCULAR | Status: AC
  Administered 2015-05-19: 25 mg via INTRAVENOUS
  Filled 2015-05-19: qty 1

## 2015-05-19 MED ORDER — SODIUM CHLORIDE 0.9 % IV BOLUS (SEPSIS)
1000.0000 mL | Freq: Once | INTRAVENOUS | Status: AC
  Administered 2015-05-19: 1000 mL via INTRAVENOUS

## 2015-05-19 MED ORDER — PROCHLORPERAZINE EDISYLATE 5 MG/ML IJ SOLN
10.0000 mg | Freq: Once | INTRAMUSCULAR | Status: AC
  Administered 2015-05-19: 10 mg via INTRAVENOUS
  Filled 2015-05-19: qty 2

## 2015-05-19 MED ORDER — BUTALBITAL-APAP-CAFFEINE 50-325-40 MG PO TABS: 1.0000 | ORAL_TABLET | Freq: Four times a day (QID) | ORAL | Status: DC | PRN

## 2015-05-19 NOTE — ED Notes (Signed)
Pt alert & oriented x4, stable gait. Patient given discharge instructions, paperwork & prescription(s). Patient  instructed to stop at the registration desk to finish any additional paperwork. Patient verbalized understanding. Pt left department w/ no further questions. 

## 2015-05-19 NOTE — Discharge Instructions (Signed)

## 2015-05-19 NOTE — ED Provider Notes (Signed)
CSN: [id number]     Arrival date & time 05/19/15  1839 History   First MD Initiated Contact with Patient 05/19/15 1900     Chief Complaint  Patient presents with  . Headache     (Consider location/radiation/quality/duration/timing/severity/associated sxs/prior Treatment) HPI   The pt has chronic pain / fibromyalgia / chronic migraines / chronic fatigue and multiple ED Visits for pain complaints - she has stated that she has had a hedache for 2.5 days - it is constant - it comes up the back of her head and is on both side s- she states that it is the same as previous headaches and she has been taking her home meds of fioricet without relief and "took my last one today".  She reports that she has had nothing to eat in 2 days - can't hold down water and has had zofran and "the other nausea medicine" without relief.  She states she has visual auras with her headaches and in fact has been having them with this one - she reports no numbness / weakenss / abd pain / chest pain, cough or SOB - but claims to have diffuse body body in her neck and shoulder which she has been seen for frequently as well.  In addition to her home medicines, she has also been trying to keep herself in dark rooms and minimizing the noises - none of this has helped.    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  Past Medical History  Diagnosis Date  . Hypertension   . Asthma   . Pelvic inflammatory disease (PID)   . Fibromyalgia   . Chronic abdominal pain   . Migraine   . 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 Tubal ligation    . Kidney infections    . Wisdom tooth extraction     Family History  Problem Relation Age of Onset  . Hypertension Mother   . Diabetes Mother   . Heart failure Father    Social History  Substance Use Topics  . Smoking status: Former Smoker -- 0.50 packs/day    Types: Cigarettes    Quit date: 03/18/2015  . Smokeless  tobacco: Never Used     Comment: Occasional smoker  . Alcohol Use: Yes     Comment: occasionally   OB History    Gravida Para Term Preterm AB TAB SAB Ectopic Multiple Living   4 4 1 3      4      Review of Systems  All other systems reviewed and are negative.     Allergies  Metoclopramide; Tomato; Aspirin; Doxycycline; Hydromorphone hcl; Ibuprofen; Ondansetron; Other; Shellfish allergy; Toradol; Latex; and Tramadol  Home Medications   Prior to Admission medications   Medication Sig Start Date End Date Taking? Authorizing Provider  acetaminophen-codeine (TYLENOL #4) 300-60 MG tablet Take 1 tablet by mouth every 4 (four) hours as needed for moderate pain.    Historical Provider, MD  albuterol (PROVENTIL HFA;VENTOLIN HFA) 108 (90 BASE) MCG/ACT inhaler Inhale 2 puffs into the lungs every 6 (six) hours as needed for wheezing or shortness of breath. Reported on 01/13/2015 01/14/12   Consuello Masse, MD  atenolol (TENORMIN) 50 MG tablet Take 50 mg by mouth daily. Reported on 01/13/2015 12/18/14   Historical Provider, MD  butalbital-acetaminophen-caffeine (FIORICET) 50-325-40 MG tablet Take 1-2 tablets by mouth every 6 (six) hours as needed for headache.  05/19/15 05/18/16  Eber Hong, MD  DULoxetine (CYMBALTA) 60 MG capsule Take 60 mg by mouth daily.    Historical Provider, MD  folic acid (FOLVITE) 1 MG tablet Take 1 tablet (1 mg total) by mouth daily. 10/17/14   Jerald Kief, MD  gabapentin (NEURONTIN) 300 MG capsule Take 1 capsule (300 mg total) by mouth 3 (three) times daily. Patient taking differently: Take 600 mg by mouth 3 (three) times daily.  11/18/14   Jaclyn Shaggy, MD  hydrOXYzine (VISTARIL) 25 MG capsule Take 25 mg by mouth 3 (three) times daily as needed.    Historical Provider, MD  LORazepam (ATIVAN) 1 MG tablet Take 1 mg by mouth 2 (two) times daily as needed. For 30 days 05/07/15   Historical Provider, MD  oxyCODONE-acetaminophen (PERCOCET/ROXICET) 5-325 MG tablet Take 1 tablet by  mouth every 6 (six) hours as needed. 04/23/15   Bethann Berkshire, MD  promethazine (PHENERGAN) 25 MG suppository Place 1 suppository (25 mg total) rectally every 6 (six) hours as needed for nausea or vomiting. 05/14/15   Raeford Razor, MD  rOPINIRole (REQUIP) 0.25 MG tablet Take 0.25 mg by mouth at bedtime.    Historical Provider, MD  tapentadol (NUCYNTA) 50 MG TABS tablet Take 50 mg by mouth every 8 (eight) hours as needed for moderate pain.  01/30/15   Historical Provider, MD  traZODone (DESYREL) 100 MG tablet Take 100-200 mg by mouth at bedtime.    Historical Provider, MD   BP 147/99 mmHg  Pulse 98  Temp(Src) 98.4 F (36.9 C) (Temporal)  Resp 18  Wt 164 lb 6 oz (74.56 kg)  SpO2 100%  LMP 04/28/2015 Physical Exam  Constitutional: She appears well-developed and well-nourished. No distress.  HENT:  Head: Normocephalic and atraumatic.  Mouth/Throat: Oropharynx is clear and moist. No oropharyngeal exudate.  Normal appearing mucous membranes, clear OP, no erythema, swelling, uvular deviation or exudate.  Normal phonation  Eyes: Conjunctivae and EOM are normal. Pupils are equal, round, and reactive to light. Right eye exhibits no discharge. Left eye exhibits no discharge. No scleral icterus.  Clear conj and reactive pupils  Neck: Normal range of motion. Neck supple. No JVD present. No thyromegaly present.  The pt withdraws from even light touch of her neck / her shoulders or her trapezius  Muscles.  Cardiovascular: Normal rate, regular rhythm, normal heart sounds and intact distal pulses.  Exam reveals no gallop and no friction rub.   No murmur heard. Pulmonary/Chest: Effort normal and breath sounds normal. No respiratory distress. She has no wheezes. She has no rales.  Abdominal: Soft. Bowel sounds are normal. She exhibits no distension and no mass. There is no tenderness.  Musculoskeletal: Normal range of motion. She exhibits tenderness ( diffuse tenderness over all muscle groups). She exhibits no  edema.  Lymphadenopathy:    She has no cervical adenopathy.  Neurological: She is alert. Coordination normal.  Moves all 4 extremities with normal strength, normal sensation and coordination - she has normal speech and memory.  Skin: Skin is warm and dry. No rash noted. No erythema.  Psychiatric: She has a normal mood and affect. Her behavior is normal.  Nursing note and vitals reviewed.   ED Course  Procedures (including critical care time) Labs Review Labs Reviewed  URINALYSIS, ROUTINE W REFLEX MICROSCOPIC (NOT AT [facility])    Imaging Review No results found. I have personally reviewed and evaluated these images and lab results as part of my medical decision-making.    MDM  Final diagnoses:  Nonintractable headache, unspecified chronicity pattern, unspecified headache type    The pt does not appear ill, she has chronic pain and her presentation suggests chronic pain. 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  Improved after meds - pt requesting more opiates I will defer to her primary fioricet X 15 pills given as Rx for home, Pt in agreement.  Meds given in ED:  Medications  prochlorperazine (COMPAZINE) injection 10 mg (10 mg Intravenous Given 05/19/15 1944)  diphenhydrAMINE (BENADRYL) injection 25 mg (25 mg Intravenous Given 05/19/15 1944)  sodium chloride 0.9 % bolus 1,000 mL (0 mLs Intravenous Stopped 05/19/15 2043)    New Prescriptions   BUTALBITAL-ACETAMINOPHEN-CAFFEINE (FIORICET) 50-325-40 MG TABLET    Take 1-2 tablets by mouth every 6 (six) hours as needed for headache.        Eber Hong, MD 05/19/15 2130

## 2015-05-19 NOTE — ED Notes (Signed)
Pt resting calmly w/ eyes closed. Rise & fall of the chest noted. Family at bedside. Bed in low position, side rails up x2. NAD noted at this time.  

## 2015-05-19 NOTE — ED Notes (Signed)
Pt c/o migraine and vomiting x 2 days.

## 2015-06-07 ENCOUNTER — Emergency Department (HOSPITAL_COMMUNITY)
Admission: EM | Admit: 2015-06-07 | Discharge: 2015-06-07 | Disposition: A | Discharge status: Home / Self Care | Payer: Medicaid Other | Attending: Emergency Medicine | Admitting: Emergency Medicine

## 2015-06-07 ENCOUNTER — Encounter (HOSPITAL_COMMUNITY): Payer: Self-pay

## 2015-06-07 ENCOUNTER — Emergency Department (HOSPITAL_COMMUNITY): Payer: Medicaid Other

## 2015-06-07 DIAGNOSIS — J45909 Unspecified asthma, uncomplicated: Secondary | ICD-10-CM | POA: Diagnosis not present

## 2015-06-07 DIAGNOSIS — K529 Noninfective gastroenteritis and colitis, unspecified: Secondary | ICD-10-CM | POA: Insufficient documentation

## 2015-06-07 DIAGNOSIS — Z87891 Personal history of nicotine dependence: Secondary | ICD-10-CM | POA: Diagnosis not present

## 2015-06-07 DIAGNOSIS — Z79899 Other long term (current) drug therapy: Secondary | ICD-10-CM | POA: Diagnosis not present

## 2015-06-07 DIAGNOSIS — R1032 Left lower quadrant pain: Secondary | ICD-10-CM | POA: Diagnosis present

## 2015-06-07 DIAGNOSIS — I10 Essential (primary) hypertension: Secondary | ICD-10-CM | POA: Diagnosis not present

## 2015-06-07 LAB — URINALYSIS, ROUTINE W REFLEX MICROSCOPIC
Bilirubin Urine: NEGATIVE
GLUCOSE, UA: NEGATIVE mg/dL
HGB URINE DIPSTICK: NEGATIVE
Ketones, ur: NEGATIVE mg/dL
Leukocytes, UA: NEGATIVE
Nitrite: NEGATIVE
PH: 5.5 (ref 5.0–8.0)
Protein, ur: NEGATIVE mg/dL
SPECIFIC GRAVITY, URINE: 1.015 (ref 1.005–1.030)

## 2015-06-07 LAB — PREGNANCY, URINE: PREG TEST UR: NEGATIVE

## 2015-06-07 LAB — CBC
HEMATOCRIT: 36.2 % (ref 36.0–46.0)
HEMOGLOBIN: 12.5 g/dL (ref 12.0–15.0)
MCH: 28.4 pg (ref 26.0–34.0)
MCHC: 34.5 g/dL (ref 30.0–36.0)
MCV: 82.3 fL (ref 78.0–100.0)
Platelets: 270 10*3/uL (ref 150–400)
RBC: 4.4 MIL/uL (ref 3.87–5.11)
RDW: 13.4 % (ref 11.5–15.5)
WBC: 8.7 10*3/uL (ref 4.0–10.5)

## 2015-06-07 LAB — COMPREHENSIVE METABOLIC PANEL
ALT: 9 U/L — AB (ref 14–54)
ANION GAP: 6 (ref 5–15)
AST: 15 U/L (ref 15–41)
Albumin: 3.5 g/dL (ref 3.5–5.0)
Alkaline Phosphatase: 46 U/L (ref 38–126)
BUN: 6 mg/dL (ref 6–20)
CHLORIDE: 107 mmol/L (ref 101–111)
CO2: 23 mmol/L (ref 22–32)
CREATININE: 0.6 mg/dL (ref 0.44–1.00)
Calcium: 8.7 mg/dL — ABNORMAL LOW (ref 8.9–10.3)
GFR calc non Af Amer: >60 mL/min (ref >60)
Glucose, Bld: 110 mg/dL — ABNORMAL HIGH (ref 65–99)
Potassium: 3.5 mmol/L (ref 3.5–5.1)
SODIUM: 136 mmol/L (ref 135–145)
Total Bilirubin: 0.4 mg/dL (ref 0.3–1.2)
Total Protein: 6.5 g/dL (ref 6.5–8.1)

## 2015-06-07 LAB — LIPASE, BLOOD: LIPASE: 24 U/L (ref 11–51)

## 2015-06-07 LAB — RAPID URINE DRUG SCREEN, HOSP PERFORMED
Amphetamines: NOT DETECTED
BARBITURATES: NOT DETECTED
BENZODIAZEPINES: NOT DETECTED
Cocaine: NOT DETECTED
Opiates: NOT DETECTED
Tetrahydrocannabinol: POSITIVE — AB

## 2015-06-07 MED ORDER — PROMETHAZINE HCL 25 MG RE SUPP
25.0000 mg | Freq: Four times a day (QID) | RECTAL | Status: DC | PRN
  Administered 2015-06-07: 25 mg via RECTAL
  Filled 2015-06-07: qty 1

## 2015-06-07 MED ORDER — SODIUM CHLORIDE 0.9 % IV BOLUS (SEPSIS)
1000.0000 mL | Freq: Once | INTRAVENOUS | Status: AC
  Administered 2015-06-07: 1000 mL via INTRAVENOUS

## 2015-06-07 MED ORDER — METRONIDAZOLE IN NACL 5-0.79 MG/ML-% IV SOLN
500.0000 mg | Freq: Once | INTRAVENOUS | Status: AC
  Administered 2015-06-07: 500 mg via INTRAVENOUS
  Filled 2015-06-07: qty 100

## 2015-06-07 MED ORDER — DIPHENHYDRAMINE HCL 50 MG/ML IJ SOLN
25.0000 mg | Freq: Once | INTRAMUSCULAR | Status: AC
  Administered 2015-06-07: 25 mg via INTRAVENOUS
  Filled 2015-06-07: qty 1

## 2015-06-07 MED ORDER — MORPHINE SULFATE (PF) 4 MG/ML IV SOLN
4.0000 mg | Freq: Once | INTRAVENOUS | Status: AC
  Administered 2015-06-07: 4 mg via INTRAMUSCULAR
  Filled 2015-06-07: qty 1

## 2015-06-07 MED ORDER — MORPHINE SULFATE (PF) 4 MG/ML IV SOLN
INTRAVENOUS | Status: AC
  Administered 2015-06-07: 4 mg via INTRAVENOUS
  Filled 2015-06-07: qty 1

## 2015-06-07 MED ORDER — CIPROFLOXACIN HCL 250 MG PO TABS
500.0000 mg | ORAL_TABLET | Freq: Once | ORAL | Status: AC
  Administered 2015-06-07: 500 mg via ORAL
  Filled 2015-06-07: qty 2

## 2015-06-07 MED ORDER — METRONIDAZOLE 500 MG PO TABS
500.0000 mg | ORAL_TABLET | Freq: Once | ORAL | Status: AC
  Administered 2015-06-07: 500 mg via ORAL
  Filled 2015-06-07: qty 1

## 2015-06-07 MED ORDER — OXYCODONE-ACETAMINOPHEN 5-325 MG PO TABS: 2.0000 | ORAL_TABLET | ORAL | Status: DC | PRN

## 2015-06-07 MED ORDER — HYDROMORPHONE HCL 1 MG/ML IJ SOLN
1.0000 mg | Freq: Once | INTRAMUSCULAR | Status: AC
  Administered 2015-06-07: 1 mg via INTRAVENOUS
  Filled 2015-06-07: qty 1

## 2015-06-07 MED ORDER — IOPAMIDOL (ISOVUE-300) INJECTION 61%
100.0000 mL | Freq: Once | INTRAVENOUS | Status: AC | PRN
  Administered 2015-06-07: 100 mL via INTRAVENOUS

## 2015-06-07 MED ORDER — MORPHINE SULFATE (PF) 4 MG/ML IV SOLN
4.0000 mg | Freq: Once | INTRAVENOUS | Status: AC
  Administered 2015-06-07: 4 mg via INTRAVENOUS

## 2015-06-07 MED ORDER — METRONIDAZOLE 500 MG PO TABS: 500.0000 mg | ORAL_TABLET | Freq: Four times a day (QID) | ORAL | Status: DC

## 2015-06-07 MED ORDER — PROMETHAZINE HCL 25 MG PO TABS: 25.0000 mg | ORAL_TABLET | Freq: Four times a day (QID) | ORAL | Status: DC | PRN

## 2015-06-07 MED ORDER — CIPROFLOXACIN IN D5W 400 MG/200ML IV SOLN: 400.0000 mg | Freq: Once | INTRAVENOUS | Status: DC

## 2015-06-07 MED ORDER — MORPHINE SULFATE (PF) 4 MG/ML IV SOLN
4.0000 mg | Freq: Once | INTRAVENOUS | Status: AC
  Administered 2015-06-07: 4 mg via INTRAVENOUS
  Filled 2015-06-07: qty 1

## 2015-06-07 MED ORDER — ZOLPIDEM TARTRATE 5 MG PO TABS
10.0000 mg | ORAL_TABLET | Freq: Once | ORAL | Status: AC
  Administered 2015-06-07: 10 mg via ORAL
  Filled 2015-06-07: qty 2

## 2015-06-07 MED ORDER — DIATRIZOATE MEGLUMINE & SODIUM 66-10 % PO SOLN
ORAL | Status: AC
  Filled 2015-06-07: qty 30

## 2015-06-07 MED ORDER — CIPROFLOXACIN HCL 500 MG PO TABS: 500.0000 mg | ORAL_TABLET | Freq: Two times a day (BID) | ORAL | Status: DC

## 2015-06-07 NOTE — ED Notes (Signed)
Patient ambulated to restroom.

## 2015-06-07 NOTE — Discharge Instructions (Signed)
Prescription for antibiotics, pain medicine, nausea medicine. Drink clear liquids. Rest. Follow-up your primary care doctor.

## 2015-06-07 NOTE — ED Provider Notes (Addendum)
CSN: [id number]     Arrival date & time 06/07/15  1434 History   First MD Initiated Contact with Patient 06/07/15 1510     Chief Complaint  Patient presents with  . Abdominal Pain     (Consider location/radiation/quality/duration/timing/severity/associated sxs/prior Treatment) HPI...Marland KitchenMarland KitchenLeft lower quadrant pain for the past day with associated vomiting. She has chronic pain and fibromyalgia. No fever, sweats, chills, vomiting, bloody diarrhea. Palpation makes symptoms worse. Severity is moderate.  Past Medical History  Diagnosis Date  . Hypertension   . Asthma   . Pelvic inflammatory disease (PID)   . Fibromyalgia   . Chronic abdominal pain   . Migraine   . 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 Tubal ligation    . Kidney infections    . Wisdom tooth extraction     Family History  Problem Relation Age of Onset  . Hypertension Mother   . Diabetes Mother   . Heart failure Father    Social History  Substance Use Topics  . Smoking status: Former Smoker -- 0.50 packs/day    Types: Cigarettes    Quit date: 03/18/2015  . Smokeless tobacco: Never Used     Comment: Occasional smoker  . Alcohol Use: Yes     Comment: occasionally   OB History    Gravida Para Term Preterm AB TAB SAB Ectopic Multiple Living   4 4 1 3      4      Review of Systems  All other systems reviewed and are negative.     Allergies  Metoclopramide; Tomato; Aspirin; Doxycycline; Hydromorphone hcl; Ibuprofen; Ondansetron; Other; Shellfish allergy; Toradol; Latex; and Tramadol  Home Medications   Prior to Admission medications   Medication Sig Start Date End Date Taking? Authorizing Provider  albuterol (PROVENTIL HFA;VENTOLIN HFA) 108 (90 BASE) MCG/ACT inhaler Inhale 2 puffs into the lungs every 6 (six) hours as needed for wheezing or shortness of breath. Reported on 01/13/2015 01/14/12  Yes 01/16/12, MD  B Complex Vitamins (VITAMIN B COMPLEX  PO) Take 1 tablet by mouth daily.   Yes Historical Provider, MD  buPROPion (WELLBUTRIN XL) 150 MG 24 hr tablet Take 150 mg by mouth daily. 05/26/15  Yes Historical Provider, MD  Cholecalciferol (VITAMIN D PO) Take 1 tablet by mouth daily.   Yes Historical Provider, MD  diphenhydrAMINE (BENADRYL) 25 mg capsule Take 25 mg by mouth every 6 (six) hours as needed for allergies.   Yes Historical Provider, MD  DULoxetine (CYMBALTA) 60 MG capsule Take 60 mg by mouth daily.   Yes Historical Provider, MD  folic acid (FOLVITE) 1 MG tablet Take 1 tablet (1 mg total) by mouth daily. 10/17/14  Yes 10/19/14, MD  gabapentin (NEURONTIN) 300 MG capsule Take 1 capsule (300 mg total) by mouth 3 (three) times daily. 11/18/14  Yes 13/1/16, MD  hydrOXYzine (VISTARIL) 25 MG capsule Take 25 mg by mouth 3 (three) times daily as needed.   Yes Historical Provider, MD  LORazepam (ATIVAN) 1 MG tablet Take 1 mg by mouth 2 (two) times daily as needed. For 30 days 05/07/15  Yes Historical Provider, MD  oxyCODONE (OXY IR/ROXICODONE) 5 MG immediate release tablet Take 5 mg by mouth 3 (three) times daily as needed. 06/04/15  Yes Historical Provider, MD  QUEtiapine (SEROQUEL) 50 MG tablet Take 50 mg by mouth at bedtime. 05/26/15  Yes Historical Provider, MD  rOPINIRole (REQUIP) 2 MG tablet Take  2 mg by mouth daily. 05/26/15  Yes Historical Provider, MD  tiZANidine (ZANAFLEX) 4 MG tablet Take 4 mg by mouth 2 (two) times daily as needed for muscle spasms.  05/26/15  Yes Historical Provider, MD  traZODone (DESYREL) 100 MG tablet Take 100-200 mg by mouth at bedtime.   Yes Historical Provider, MD  butalbital-acetaminophen-caffeine (FIORICET) 50-325-40 MG tablet Take 1-2 tablets by mouth every 6 (six) hours as needed for headache. 05/19/15 05/18/16  Eber Hong, MD  ciprofloxacin (CIPRO) 500 MG tablet Take 1 tablet (500 mg total) by mouth 2 (two) times daily. 06/07/15   Donnetta Hutching, MD  metroNIDAZOLE (FLAGYL) 500 MG tablet Take 1 tablet (500 mg  total) by mouth 4 (four) times daily. 06/07/15   Donnetta Hutching, MD  oxyCODONE-acetaminophen (PERCOCET) 5-325 MG tablet Take 2 tablets by mouth every 4 (four) hours as needed. 06/07/15   Donnetta Hutching, MD  oxyCODONE-acetaminophen (PERCOCET) 5-325 MG tablet Take 2 tablets by mouth every 4 (four) hours as needed. 06/07/15   Donnetta Hutching, MD  promethazine (PHENERGAN) 25 MG tablet Take 1 tablet (25 mg total) by mouth every 6 (six) hours as needed. 06/07/15   Donnetta Hutching, MD   BP 135/99 mmHg  Pulse 87  Temp(Src) 97.6 F (36.4 C) (Oral)  Resp 16  Ht 5' (1.524 m)  Wt 170 lb (77.111 kg)  BMI 33.20 kg/m2  SpO2 97%  LMP 06/02/2015 Physical Exam  Constitutional: She is oriented to person, place, and time. She appears well-developed and well-nourished.  HENT:  Head: Normocephalic and atraumatic.  Eyes: Conjunctivae and EOM are normal. Pupils are equal, round, and reactive to light.  Neck: Normal range of motion. Neck supple.  Cardiovascular: Normal rate and regular rhythm.   Pulmonary/Chest: Effort normal and breath sounds normal.  Abdominal: Soft. Bowel sounds are normal.  Tender left lower quadrant  Musculoskeletal: Normal range of motion.  Neurological: She is alert and oriented to person, place, and time.  Skin: Skin is warm and dry.  Psychiatric: She has a normal mood and affect. Her behavior is normal.  Nursing note and vitals reviewed.   ED Course  Procedures (including critical care time) Labs Review Labs Reviewed  COMPREHENSIVE METABOLIC PANEL - Abnormal; Notable for the following:    Glucose, Bld 110 (*)    Calcium 8.7 (*)    ALT 9 (*)    All other components within normal limits  URINALYSIS, ROUTINE W REFLEX MICROSCOPIC (NOT AT [facility]) - Abnormal; Notable for the following:    APPearance HAZY (*)    All other components within normal limits  URINE RAPID DRUG SCREEN, HOSP PERFORMED - Abnormal; Notable for the following:    Tetrahydrocannabinol POSITIVE (*)    All other components within  normal limits  LIPASE, BLOOD  CBC  PREGNANCY, URINE    Imaging Review Ct Abdomen Pelvis W Contrast  06/07/2015  CLINICAL DATA:  Left-sided abdominal pain with emesis. EXAM: CT ABDOMEN AND PELVIS WITH CONTRAST TECHNIQUE: Multidetector CT imaging of the abdomen and pelvis was performed using the standard protocol following bolus administration of intravenous contrast. CONTRAST:  ISOVUE-300 IOPAMIDOL (ISOVUE-300) INJECTION 61% COMPARISON:  CT abdomen dated 04/08/2015. FINDINGS: Lower chest:  No acute findings. Hepatobiliary: Liver and gallbladder appear normal. No bile duct dilatation. Pancreas: No mass, inflammatory changes, or other significant abnormality. Spleen: Within normal limits in size and appearance. Adrenals/Urinary Tract: No masses identified. No evidence of hydronephrosis. Stomach/Bowel: Thickening of the walls of the entire rectosigmoid colon, and at least mild  thickening of the majority of the descending colon as well. Questionable mild thickening of the transverse colon and ascending colon. No dilated large or small bowel loops. Appendix is normal. Vascular/Lymphatic: No pathologically enlarged lymph nodes. No evidence of abdominal aortic aneurysm. Reproductive: No mass or other significant abnormality. Other: No free fluid or abscess collections seen. No free intraperitoneal air. Musculoskeletal:  No suspicious bone lesions identified. IMPRESSION: 1. Diffuse colitis of infectious or inflammatory nature, most pronounced within the rectosigmoid colon and lower descending colon. 2. Remainder of the abdomen and pelvis CT is unremarkable. No free fluid or abscess collection. No free intraperitoneal air. No bowel obstruction. Electronically Signed   By: Bary Richard M.D.   On: 06/07/2015 19:36   I have personally reviewed and evaluated these images and lab results as part of my medical decision-making.   EKG Interpretation None      MDM   Final diagnoses:  Colitis    Patient  feels much better after IV fluids and pain management. No acute abdomen. CT scan reveals colitis distally in lower descending colon and rectosigmoid colon. Will attempt outpatient management with Cipro, Flagyl, pain and nausea medicine. Patient has had normal vital signs throughout her emergency dept course.    Donnetta Hutching, MD 06/07/15 [id number]  Donnetta Hutching, MD 06/07/15 2229

## 2015-06-07 NOTE — ED Notes (Signed)
Left sided abdominal pain with emesis X6 per patient.

## 2015-06-09 ENCOUNTER — Encounter (HOSPITAL_COMMUNITY): Payer: Self-pay | Admitting: *Deleted

## 2015-06-09 DIAGNOSIS — Z79899 Other long term (current) drug therapy: Secondary | ICD-10-CM | POA: Insufficient documentation

## 2015-06-09 DIAGNOSIS — R112 Nausea with vomiting, unspecified: Secondary | ICD-10-CM | POA: Insufficient documentation

## 2015-06-09 DIAGNOSIS — I10 Essential (primary) hypertension: Secondary | ICD-10-CM | POA: Diagnosis not present

## 2015-06-09 DIAGNOSIS — J45909 Unspecified asthma, uncomplicated: Secondary | ICD-10-CM | POA: Insufficient documentation

## 2015-06-09 DIAGNOSIS — R109 Unspecified abdominal pain: Secondary | ICD-10-CM | POA: Insufficient documentation

## 2015-06-09 DIAGNOSIS — Z87891 Personal history of nicotine dependence: Secondary | ICD-10-CM | POA: Insufficient documentation

## 2015-06-09 MED FILL — Oxycodone w/ Acetaminophen Tab 5-325 MG: ORAL | Qty: 6 | Status: AC

## 2015-06-09 NOTE — ED Notes (Signed)
Pt c/o abdominal pain and vomiting that has not improved since she was here last

## 2015-06-10 ENCOUNTER — Emergency Department (HOSPITAL_COMMUNITY)
Admission: EM | Admit: 2015-06-10 | Discharge: 2015-06-10 | Disposition: A | Discharge status: Home / Self Care | Payer: Medicaid Other | Attending: Emergency Medicine | Admitting: Emergency Medicine

## 2015-06-10 DIAGNOSIS — R109 Unspecified abdominal pain: Secondary | ICD-10-CM

## 2015-06-10 LAB — CBC WITH DIFFERENTIAL/PLATELET
BASOS ABS: 0 10*3/uL (ref 0.0–0.1)
BASOS PCT: 1 %
EOS ABS: 0.4 10*3/uL (ref 0.0–0.7)
Eosinophils Relative: 5 %
HCT: 38.3 % (ref 36.0–46.0)
HEMOGLOBIN: 13.3 g/dL (ref 12.0–15.0)
LYMPHS ABS: 3.9 10*3/uL (ref 0.7–4.0)
LYMPHS PCT: 49 %
MCH: 28.4 pg (ref 26.0–34.0)
MCHC: 34.7 g/dL (ref 30.0–36.0)
MCV: 81.7 fL (ref 78.0–100.0)
MONOS PCT: 6 %
Monocytes Absolute: 0.5 10*3/uL (ref 0.1–1.0)
NEUTROS ABS: 3 10*3/uL (ref 1.7–7.7)
NEUTROS PCT: 39 %
Platelets: 265 10*3/uL (ref 150–400)
RBC: 4.69 MIL/uL (ref 3.87–5.11)
RDW: 13.4 % (ref 11.5–15.5)
WBC: 7.8 10*3/uL (ref 4.0–10.5)

## 2015-06-10 LAB — COMPREHENSIVE METABOLIC PANEL
ALBUMIN: 4.2 g/dL (ref 3.5–5.0)
ALK PHOS: 51 U/L (ref 38–126)
ALT: 10 U/L — AB (ref 14–54)
ANION GAP: 6 (ref 5–15)
AST: 15 U/L (ref 15–41)
BUN: 5 mg/dL — ABNORMAL LOW (ref 6–20)
CALCIUM: 9.5 mg/dL (ref 8.9–10.3)
CO2: 29 mmol/L (ref 22–32)
CREATININE: 0.68 mg/dL (ref 0.44–1.00)
Chloride: 104 mmol/L (ref 101–111)
GFR calc Af Amer: >60 mL/min (ref >60)
GFR calc non Af Amer: >60 mL/min (ref >60)
GLUCOSE: 94 mg/dL (ref 65–99)
Potassium: 3.4 mmol/L — ABNORMAL LOW (ref 3.5–5.1)
SODIUM: 139 mmol/L (ref 135–145)
Total Bilirubin: 0.4 mg/dL (ref 0.3–1.2)
Total Protein: 7.8 g/dL (ref 6.5–8.1)

## 2015-06-10 LAB — LIPASE, BLOOD: Lipase: 24 U/L (ref 11–51)

## 2015-06-10 MED ORDER — HYDROMORPHONE HCL 1 MG/ML IJ SOLN
1.0000 mg | Freq: Once | INTRAMUSCULAR | Status: AC
  Administered 2015-06-10: 1 mg via INTRAVENOUS
  Filled 2015-06-10: qty 1

## 2015-06-10 MED ORDER — KETOROLAC TROMETHAMINE 30 MG/ML IJ SOLN: 30.0000 mg | Freq: Once | INTRAMUSCULAR | Status: DC

## 2015-06-10 MED ORDER — ONDANSETRON HCL 4 MG/2ML IJ SOLN
4.0000 mg | Freq: Once | INTRAMUSCULAR | Status: AC
  Administered 2015-06-10: 4 mg via INTRAVENOUS
  Filled 2015-06-10: qty 2

## 2015-06-10 MED ORDER — CIPROFLOXACIN IN D5W 400 MG/200ML IV SOLN
400.0000 mg | Freq: Once | INTRAVENOUS | Status: AC
  Administered 2015-06-10: 400 mg via INTRAVENOUS
  Filled 2015-06-10: qty 200

## 2015-06-10 MED ORDER — METRONIDAZOLE IN NACL 5-0.79 MG/ML-% IV SOLN
500.0000 mg | Freq: Once | INTRAVENOUS | Status: AC
  Administered 2015-06-10: 500 mg via INTRAVENOUS
  Filled 2015-06-10: qty 100

## 2015-06-10 MED ORDER — OXYCODONE HCL 5 MG PO TABS
10.0000 mg | ORAL_TABLET | Freq: Once | ORAL | Status: AC
  Administered 2015-06-10: 10 mg via ORAL
  Filled 2015-06-10: qty 2

## 2015-06-10 MED ORDER — DIPHENHYDRAMINE HCL 25 MG PO CAPS
25.0000 mg | ORAL_CAPSULE | Freq: Once | ORAL | Status: AC
  Administered 2015-06-10: 25 mg via ORAL
  Filled 2015-06-10: qty 1

## 2015-06-10 MED ORDER — SODIUM CHLORIDE 0.9 % IV BOLUS (SEPSIS)
1000.0000 mL | Freq: Once | INTRAVENOUS | Status: AC
  Administered 2015-06-10: 1000 mL via INTRAVENOUS

## 2015-06-10 NOTE — ED Provider Notes (Signed)
CSN: [id number]     Arrival date & time 06/09/15  2057 History  By signing my name below, I, Iona Beard, attest that this documentation has been prepared under the direction and in the presence of Marily Memos, MD.   Electronically Signed: Iona Beard, ED Scribe. 06/10/2015. 4:33 AM   Chief Complaint  Patient presents with  . Abdominal Pain    The history is provided by the patient. No language interpreter was used.   HPI Comments: Tanya Krause is a 30 y.o. female who presents to the Emergency Department complaining of gradual onset, constant, abdominal pain, ongoing for four days. Pt reports associated nausea and emesis. Pt was seen for the same on 06/07/2015. She states her symptoms have worsened since her previous visit to the ED. No other associated symptoms noted. She has taken her ciprofloxacin with no relief to symptoms because she cannot keep the medication down. Pt has taken percocet with no relief to symptoms. She has also placed a heating pad on her abdomen with no relief to symptoms. No other worsening or alleviating factors noted. Pt denies diarrhea, constipation, fever, chills, or any other pertinent symptoms.   Past Medical History  Diagnosis Date  . Hypertension   . Asthma   . Pelvic inflammatory disease (PID)   . Fibromyalgia   . Chronic abdominal pain   . Migraine   . 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 Tubal ligation    . Kidney infections    . Wisdom tooth extraction     Family History  Problem Relation Age of Onset  . Hypertension Mother   . Diabetes Mother   . Heart failure Father    Social History  Substance Use Topics  . Smoking status: Former Smoker -- 0.50 packs/day    Types: Cigarettes    Quit date: 03/18/2015  . Smokeless tobacco: Never Used     Comment: Occasional smoker  . Alcohol Use: Yes     Comment: occasionally   OB History    Gravida Para Term Preterm AB TAB SAB  Ectopic Multiple Living   4 4 1 3      4      Review of Systems  Constitutional: Negative for fever and chills.  Gastrointestinal: Positive for nausea, vomiting and abdominal pain. Negative for diarrhea and constipation.  All other systems reviewed and are negative.   Allergies  Metoclopramide; Tomato; Aspirin; Doxycycline; Hydromorphone hcl; Ibuprofen; Ondansetron; Other; Shellfish allergy; Toradol; Latex; and Tramadol  Home Medications   Prior to Admission medications   Medication Sig Start Date End Date Taking? Authorizing Provider  albuterol (PROVENTIL HFA;VENTOLIN HFA) 108 (90 BASE) MCG/ACT inhaler Inhale 2 puffs into the lungs every 6 (six) hours as needed for wheezing or shortness of breath. Reported on 01/13/2015 01/14/12   01/16/12, MD  B Complex Vitamins (VITAMIN B COMPLEX PO) Take 1 tablet by mouth daily.    Historical Provider, MD  buPROPion (WELLBUTRIN XL) 150 MG 24 hr tablet Take 150 mg by mouth daily. 05/26/15   Historical Provider, MD  butalbital-acetaminophen-caffeine (FIORICET) 50-325-40 MG tablet Take 1-2 tablets by mouth every 6 (six) hours as needed for headache. 05/19/15 05/18/16  07/18/16, MD  Cholecalciferol (VITAMIN D PO) Take 1 tablet by mouth daily.    Historical Provider, MD  ciprofloxacin (CIPRO) 500 MG tablet Take 1 tablet (500 mg total) by mouth 2 (two) times daily. 06/07/15   06/09/15, MD  diphenhydrAMINE (BENADRYL) 25 mg capsule Take 25 mg by mouth every 6 (six) hours as needed for allergies.    Historical Provider, MD  DULoxetine (CYMBALTA) 60 MG capsule Take 60 mg by mouth daily.    Historical Provider, MD  folic acid (FOLVITE) 1 MG tablet Take 1 tablet (1 mg total) by mouth daily. 10/17/14   Jerald Kief, MD  gabapentin (NEURONTIN) 300 MG capsule Take 1 capsule (300 mg total) by mouth 3 (three) times daily. 11/18/14   Jaclyn Shaggy, MD  hydrOXYzine (VISTARIL) 25 MG capsule Take 25 mg by mouth 3 (three) times daily as needed.    Historical Provider, MD   LORazepam (ATIVAN) 1 MG tablet Take 1 mg by mouth 2 (two) times daily as needed. For 30 days 05/07/15   Historical Provider, MD  metroNIDAZOLE (FLAGYL) 500 MG tablet Take 1 tablet (500 mg total) by mouth 4 (four) times daily. 06/07/15   Donnetta Hutching, MD  oxyCODONE (OXY IR/ROXICODONE) 5 MG immediate release tablet Take 5 mg by mouth 3 (three) times daily as needed. 06/04/15   Historical Provider, MD  oxyCODONE-acetaminophen (PERCOCET) 5-325 MG tablet Take 2 tablets by mouth every 4 (four) hours as needed. 06/07/15   Donnetta Hutching, MD  oxyCODONE-acetaminophen (PERCOCET) 5-325 MG tablet Take 2 tablets by mouth every 4 (four) hours as needed. 06/07/15   Donnetta Hutching, MD  promethazine (PHENERGAN) 25 MG tablet Take 1 tablet (25 mg total) by mouth every 6 (six) hours as needed. 06/07/15   Donnetta Hutching, MD  QUEtiapine (SEROQUEL) 50 MG tablet Take 50 mg by mouth at bedtime. 05/26/15   Historical Provider, MD  rOPINIRole (REQUIP) 2 MG tablet Take 2 mg by mouth daily. 05/26/15   Historical Provider, MD  tiZANidine (ZANAFLEX) 4 MG tablet Take 4 mg by mouth 2 (two) times daily as needed for muscle spasms.  05/26/15   Historical Provider, MD  traZODone (DESYREL) 100 MG tablet Take 100-200 mg by mouth at bedtime.    Historical Provider, MD   BP 151/99 mmHg  Pulse 85  Temp(Src) 98.2 F (36.8 C) (Oral)  Resp 14  Ht 5' (1.524 m)  Wt 170 lb (77.111 kg)  BMI 33.20 kg/m2  SpO2 100%  LMP 06/02/2015 Physical Exam  Constitutional: She appears well-developed and well-nourished. No distress.  HENT:  Head: Normocephalic and atraumatic.  Eyes: Conjunctivae and EOM are normal.  Neck: Neck supple. No tracheal deviation present.  Cardiovascular: Normal rate, regular rhythm and normal heart sounds.  Exam reveals no gallop.   No murmur heard. Pulmonary/Chest: Effort normal and breath sounds normal. No respiratory distress. She has no wheezes. She has no rales.  Abdominal: Soft. Bowel sounds are normal. She exhibits no distension. There  is tenderness. There is no rebound and no guarding.  Mild diffuse abdominal TTP.   Musculoskeletal: Normal range of motion.  Neurological: She is alert.  Skin: Skin is warm and dry.  Psychiatric: She has a normal mood and affect. Her behavior is normal.    ED Course  Procedures (including critical care time) DIAGNOSTIC STUDIES: Oxygen Saturation is 100% on RA, normal by my interpretation.    COORDINATION OF CARE: 12:14 AM Discussed treatment plan with pt at bedside and pt agreed to plan.   Labs Review Labs Reviewed  COMPREHENSIVE METABOLIC PANEL - Abnormal; Notable for the following:    Potassium 3.4 (*)    BUN 5 (*)    ALT 10 (*)    All other components within normal limits  CBC WITH DIFFERENTIAL/PLATELET  LIPASE, BLOOD  URINALYSIS, ROUTINE W REFLEX MICROSCOPIC (NOT AT [facility])    Imaging Review No results found. I have personally reviewed and evaluated these images and lab results as part of my medical decision-making.   EKG Interpretation None      MDM   Final diagnoses:  Abdominal pain, unspecified abdominal location   Recent diagnosis of colitis. States vomiting and pain not improved at home.  Exam with minimal ttp in upper abdomen without peritonitis. No fever or other signs of serious bacterial infection. Pain treated in ED, symptoms substantially improved. No vomiting in >3 hours while in ED, should be able to tolerate her home pain meds and antibiotics.  Without systemic illness or worsening abdominal exam, complications from colitis (abscess, perforation) are unlikely, however patient will return in 1-2 days if not improving with oral antibiotics or having worsening vomiting.   New Prescriptions: New Prescriptions   No medications on file     I have personally and contemperaneously reviewed labs and imaging and used in my decision making as above.   A medical screening exam was performed and I feel the patient has had an appropriate workup for their chief  complaint at this time and likelihood of emergent condition existing is low and thus workup can continue on an outpatient basis.. Their vital signs are stable. They have been counseled on decision, discharge, follow up and which symptoms necessitate immediate return to the emergency department.  They verbally stated understanding and agreement with plan and discharged in stable condition.   I personally performed the services described in this documentation, which was scribed in my presence. The recorded information has been reviewed and is accurate.     Marily Memos, MD 06/10/15 [id number]

## 2015-06-10 NOTE — ED Notes (Signed)
Pt provided pain meds per MD instruct-

## 2015-06-10 NOTE — ED Notes (Signed)
Physician in to reassess patient

## 2015-06-10 NOTE — ED Notes (Signed)
Pt requesting pain meds. She is resting quietly with eyes closed.

## 2015-06-22 ENCOUNTER — Emergency Department (HOSPITAL_COMMUNITY)
Admission: EM | Admit: 2015-06-22 | Discharge: 2015-06-22 | Disposition: A | Discharge status: Home / Self Care | Payer: Medicaid Other | Attending: Emergency Medicine | Admitting: Emergency Medicine

## 2015-06-22 ENCOUNTER — Encounter (HOSPITAL_COMMUNITY): Payer: Self-pay | Admitting: Emergency Medicine

## 2015-06-22 DIAGNOSIS — R1032 Left lower quadrant pain: Secondary | ICD-10-CM

## 2015-06-22 DIAGNOSIS — I10 Essential (primary) hypertension: Secondary | ICD-10-CM | POA: Diagnosis not present

## 2015-06-22 DIAGNOSIS — Z87891 Personal history of nicotine dependence: Secondary | ICD-10-CM | POA: Diagnosis not present

## 2015-06-22 DIAGNOSIS — E669 Obesity, unspecified: Secondary | ICD-10-CM | POA: Insufficient documentation

## 2015-06-22 DIAGNOSIS — M79605 Pain in left leg: Secondary | ICD-10-CM | POA: Insufficient documentation

## 2015-06-22 DIAGNOSIS — R112 Nausea with vomiting, unspecified: Secondary | ICD-10-CM | POA: Insufficient documentation

## 2015-06-22 DIAGNOSIS — M79604 Pain in right leg: Secondary | ICD-10-CM | POA: Diagnosis not present

## 2015-06-22 DIAGNOSIS — J45909 Unspecified asthma, uncomplicated: Secondary | ICD-10-CM | POA: Insufficient documentation

## 2015-06-22 LAB — URINALYSIS, ROUTINE W REFLEX MICROSCOPIC
Bilirubin Urine: NEGATIVE
Glucose, UA: NEGATIVE mg/dL
Ketones, ur: 40 mg/dL — AB
Leukocytes, UA: NEGATIVE
NITRITE: NEGATIVE
Protein, ur: NEGATIVE mg/dL
SPECIFIC GRAVITY, URINE: 1.02 (ref 1.005–1.030)
pH: 5.5 (ref 5.0–8.0)

## 2015-06-22 LAB — CBC WITH DIFFERENTIAL/PLATELET
BASOS PCT: 0 %
Basophils Absolute: 0 10*3/uL (ref 0.0–0.1)
EOS ABS: 0.3 10*3/uL (ref 0.0–0.7)
Eosinophils Relative: 4 %
HCT: 38.9 % (ref 36.0–46.0)
HEMOGLOBIN: 13.3 g/dL (ref 12.0–15.0)
LYMPHS ABS: 4 10*3/uL (ref 0.7–4.0)
Lymphocytes Relative: 43 %
MCH: 27.9 pg (ref 26.0–34.0)
MCHC: 34.2 g/dL (ref 30.0–36.0)
MCV: 81.6 fL (ref 78.0–100.0)
Monocytes Absolute: 0.7 10*3/uL (ref 0.1–1.0)
Monocytes Relative: 7 %
NEUTROS ABS: 4.2 10*3/uL (ref 1.7–7.7)
NEUTROS PCT: 46 %
Platelets: 266 10*3/uL (ref 150–400)
RBC: 4.77 MIL/uL (ref 3.87–5.11)
RDW: 13.3 % (ref 11.5–15.5)
WBC: 9.2 10*3/uL (ref 4.0–10.5)

## 2015-06-22 LAB — BASIC METABOLIC PANEL
Anion gap: 6 (ref 5–15)
BUN: 7 mg/dL (ref 6–20)
CHLORIDE: 105 mmol/L (ref 101–111)
CO2: 25 mmol/L (ref 22–32)
Calcium: 9.4 mg/dL (ref 8.9–10.3)
Creatinine, Ser: 0.57 mg/dL (ref 0.44–1.00)
GFR calc non Af Amer: >60 mL/min (ref >60)
Glucose, Bld: 94 mg/dL (ref 65–99)
POTASSIUM: 3.6 mmol/L (ref 3.5–5.1)
SODIUM: 136 mmol/L (ref 135–145)

## 2015-06-22 LAB — URINE MICROSCOPIC-ADD ON

## 2015-06-22 MED ORDER — ACETAMINOPHEN 325 MG PO TABS
650.0000 mg | ORAL_TABLET | Freq: Once | ORAL | Status: AC
  Administered 2015-06-22: 650 mg via ORAL
  Filled 2015-06-22: qty 2

## 2015-06-22 MED ORDER — PROCHLORPERAZINE EDISYLATE 5 MG/ML IJ SOLN
10.0000 mg | Freq: Once | INTRAMUSCULAR | Status: AC
  Administered 2015-06-22: 10 mg via INTRAMUSCULAR
  Filled 2015-06-22: qty 2

## 2015-06-22 MED ORDER — PROCHLORPERAZINE MALEATE 10 MG PO TABS: 10.0000 mg | ORAL_TABLET | Freq: Two times a day (BID) | ORAL | Status: DC | PRN

## 2015-06-22 MED ORDER — PROMETHAZINE HCL 25 MG PO TABS: 25.0000 mg | ORAL_TABLET | Freq: Four times a day (QID) | ORAL | Status: DC | PRN

## 2015-06-22 NOTE — Discharge Instructions (Signed)

## 2015-06-22 NOTE — ED Notes (Signed)
Pt has multiple complaints. 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 Pt also c/o abd pain/n/v.

## 2015-06-22 NOTE — ED Provider Notes (Signed)
CSN: [id number]     Arrival date & time 06/22/15  1615 History   First MD Initiated Contact with Patient 06/22/15 2002     Chief Complaint  Patient presents with  . Sickle Cell Pain Crisis     (Consider location/radiation/quality/duration/timing/severity/associated sxs/prior Treatment) HPI  The patient is a 30 year old female, she is well known to the emergency department for chronic pain, she was seen in the last 2 weeks at which time she had abdominal pain and a CT scan showing colitis for which she was treated with antibiotics including Cipro and Flagyl. She finished the antibiotics, she no longer has any diarrhea but reports that her abdominal pain persists. She states it is in her lower abdomen, bilateral and middle of the lower abdomen, there is no back pain, she has associated vomiting today and states that she cannot hold anything down. She also reports severe pain all over including her bilateral lower extremities, all joints, all compartments. Review of her prior record and my knowledge of this patient through personal interaction of multiple visits shows that she is often frequently here with lots of medical complaints, frequently stating that she hurts all over. She has tried to state that she has sickle cell disease in the past when in fact she does not, she has a hemoglobin C trait.  Past Medical History  Diagnosis Date  . Hypertension   . Asthma   . Pelvic inflammatory disease (PID)   . Fibromyalgia   . Chronic abdominal pain   . Migraine   . 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 Tubal ligation    . Kidney infections    . Wisdom tooth extraction     Family History  Problem Relation Age of Onset  . Hypertension Mother   . Diabetes Mother   . Heart failure Father    Social History  Substance Use Topics  . Smoking status: Former Smoker -- 0.50 packs/day    Types: Cigarettes    Quit date: 03/18/2015  . Smokeless  tobacco: Never Used     Comment: Occasional smoker  . Alcohol Use: Yes     Comment: occasionally   OB History    Gravida Para Term Preterm AB TAB SAB Ectopic Multiple Living   4 4 1 3      4      Review of Systems  All other systems reviewed and are negative.     Allergies  Metoclopramide; Tomato; Aspirin; Doxycycline; Hydromorphone hcl; Ibuprofen; Ondansetron; Other; Shellfish allergy; Toradol; Latex; and Tramadol  Home Medications   Prior to Admission medications   Medication Sig Start Date End Date Taking? Authorizing Provider  albuterol (PROVENTIL HFA;VENTOLIN HFA) 108 (90 BASE) MCG/ACT inhaler Inhale 2 puffs into the lungs every 6 (six) hours as needed for wheezing or shortness of breath. Reported on 01/13/2015 01/14/12   01/16/12, MD  B Complex Vitamins (VITAMIN B COMPLEX PO) Take 1 tablet by mouth daily.    Historical Provider, MD  buPROPion (WELLBUTRIN XL) 150 MG 24 hr tablet Take 150 mg by mouth daily. 05/26/15   Historical Provider, MD  butalbital-acetaminophen-caffeine (FIORICET) 50-325-40 MG tablet Take 1-2 tablets by mouth every 6 (six) hours as needed for headache. 05/19/15 05/18/16  07/18/16, MD  Cholecalciferol (VITAMIN D PO) Take 1 tablet by mouth daily.    Historical Provider, MD  ciprofloxacin (CIPRO) 500 MG tablet Take 1 tablet (500 mg total) by mouth 2 (two)  times daily. 06/07/15   Donnetta Hutching, MD  diphenhydrAMINE (BENADRYL) 25 mg capsule Take 25 mg by mouth every 6 (six) hours as needed for allergies.    Historical Provider, MD  DULoxetine (CYMBALTA) 60 MG capsule Take 60 mg by mouth daily.    Historical Provider, MD  folic acid (FOLVITE) 1 MG tablet Take 1 tablet (1 mg total) by mouth daily. 10/17/14   Jerald Kief, MD  gabapentin (NEURONTIN) 300 MG capsule Take 1 capsule (300 mg total) by mouth 3 (three) times daily. 11/18/14   Jaclyn Shaggy, MD  hydrOXYzine (VISTARIL) 25 MG capsule Take 25 mg by mouth 3 (three) times daily as needed.    Historical Provider,  MD  LORazepam (ATIVAN) 1 MG tablet Take 1 mg by mouth 2 (two) times daily as needed. For 30 days 05/07/15   Historical Provider, MD  metroNIDAZOLE (FLAGYL) 500 MG tablet Take 1 tablet (500 mg total) by mouth 4 (four) times daily. 06/07/15   Donnetta Hutching, MD  oxyCODONE (OXY IR/ROXICODONE) 5 MG immediate release tablet Take 5 mg by mouth 3 (three) times daily as needed. 06/04/15   Historical Provider, MD  oxyCODONE-acetaminophen (PERCOCET) 5-325 MG tablet Take 2 tablets by mouth every 4 (four) hours as needed. 06/07/15   Donnetta Hutching, MD  oxyCODONE-acetaminophen (PERCOCET) 5-325 MG tablet Take 2 tablets by mouth every 4 (four) hours as needed. 06/07/15   Donnetta Hutching, MD  prochlorperazine (COMPAZINE) 10 MG tablet Take 1 tablet (10 mg total) by mouth 2 (two) times daily as needed for nausea or vomiting (Nausea ). 06/22/15   Eber Hong, MD  promethazine (PHENERGAN) 25 MG tablet Take 1 tablet (25 mg total) by mouth every 6 (six) hours as needed for nausea or vomiting. 06/22/15   Eber Hong, MD  QUEtiapine (SEROQUEL) 50 MG tablet Take 50 mg by mouth at bedtime. 05/26/15   Historical Provider, MD  rOPINIRole (REQUIP) 2 MG tablet Take 2 mg by mouth daily. 05/26/15   Historical Provider, MD  tiZANidine (ZANAFLEX) 4 MG tablet Take 4 mg by mouth 2 (two) times daily as needed for muscle spasms.  05/26/15   Historical Provider, MD  traZODone (DESYREL) 100 MG tablet Take 100-200 mg by mouth at bedtime.    Historical Provider, MD   BP 154/104 mmHg  Pulse 70  Temp(Src) 98.3 F (36.8 C)  Resp 18  Ht 5' (1.524 m)  Wt 170 lb (77.111 kg)  BMI 33.20 kg/m2  SpO2 100%  LMP 06/02/2015 Physical Exam  Constitutional: She appears well-developed and well-nourished. No distress.  HENT:  Head: Normocephalic and atraumatic.  Mouth/Throat: Oropharynx is clear and moist. No oropharyngeal exudate.  Eyes: Conjunctivae and EOM are normal. Pupils are equal, round, and reactive to light. Right eye exhibits no discharge. Left eye exhibits no  discharge. No scleral icterus.  Neck: Normal range of motion. Neck supple. No JVD present. No thyromegaly present.  Cardiovascular: Normal rate, regular rhythm, normal heart sounds and intact distal pulses.  Exam reveals no gallop and no friction rub.   No murmur heard. Pulmonary/Chest: Effort normal and breath sounds normal. No respiratory distress. She has no wheezes. She has no rales.  Abdominal: Soft. Bowel sounds are normal. She exhibits no distension and no mass. There is tenderness ( Very soft abdomen, very obese, expresses tenderness when pushed on the right and left lower quadrants and suprapubic region, no guarding, no peritoneal signs, no masses).  Musculoskeletal: Normal range of motion. She exhibits no edema or tenderness.  Lymphadenopathy:    She has no cervical adenopathy.  Neurological: She is alert. Coordination normal.  Skin: Skin is warm and dry. No rash noted. No erythema.  Psychiatric: She has a normal mood and affect. Her behavior is normal.  Nursing note and vitals reviewed.   ED Course  Procedures (including critical care time) Labs Review Labs Reviewed  URINALYSIS, ROUTINE W REFLEX MICROSCOPIC (NOT AT [facility]) - Abnormal; Notable for the following:    Hgb urine dipstick TRACE (*)    Ketones, ur 40 (*)    All other components within normal limits  URINE MICROSCOPIC-ADD ON - Abnormal; Notable for the following:    Squamous Epithelial / LPF 6-30 (*)    Bacteria, UA FEW (*)    All other components within normal limits  CBC WITH DIFFERENTIAL/PLATELET  BASIC METABOLIC PANEL    Imaging Review No results found. I have personally reviewed and evaluated these images and lab results as part of my medical decision-making.    MDM   Final diagnoses:  Left lower quadrant pain  Non-intractable vomiting with nausea, vomiting of unspecified type    The patient has a normal pulse, her blood pressure is slightly elevated and there is no fever. I will obtain some labs and  give her an antiemetic, I do not plan on giving her any significant pain medication unless she has significant abnormalities on her workup. The patient does have an element of drug-seeking behavior. As she has no longer had any diarrhea for several days to over a week I do not think that she has ongoing colitis.  The pt has normal labs other than some ketones in the urine - she is tolerating fluids after compazine - she will be given Rx for compazine and phenergan - she has been given tylenol for pain - she has no tachycardia, fever or leukocytosis - stable appearing for d/c.  Meds given in ED:  Medications  acetaminophen (TYLENOL) tablet 650 mg (not administered)  prochlorperazine (COMPAZINE) injection 10 mg (10 mg Intramuscular Given 06/22/15 2035)    New Prescriptions   PROCHLORPERAZINE (COMPAZINE) 10 MG TABLET    Take 1 tablet (10 mg total) by mouth 2 (two) times daily as needed for nausea or vomiting (Nausea ).   PROMETHAZINE (PHENERGAN) 25 MG TABLET    Take 1 tablet (25 mg total) by mouth every 6 (six) hours as needed for nausea or vomiting.      Eber Hong, MD 06/22/15 2144

## 2015-07-06 ENCOUNTER — Encounter: Payer: Self-pay | Admitting: Internal Medicine

## 2015-07-27 ENCOUNTER — Emergency Department (HOSPITAL_COMMUNITY)
Admission: EM | Admit: 2015-07-27 | Discharge: 2015-07-27 | Discharge status: Against Medical Advice | Payer: Medicaid Other | Attending: Emergency Medicine | Admitting: Emergency Medicine

## 2015-07-27 ENCOUNTER — Emergency Department (HOSPITAL_COMMUNITY): Payer: Medicaid Other

## 2015-07-27 ENCOUNTER — Encounter (HOSPITAL_COMMUNITY): Payer: Self-pay | Admitting: Emergency Medicine

## 2015-07-27 DIAGNOSIS — J45909 Unspecified asthma, uncomplicated: Secondary | ICD-10-CM | POA: Insufficient documentation

## 2015-07-27 DIAGNOSIS — Z79899 Other long term (current) drug therapy: Secondary | ICD-10-CM | POA: Diagnosis not present

## 2015-07-27 DIAGNOSIS — M791 Myalgia: Secondary | ICD-10-CM | POA: Insufficient documentation

## 2015-07-27 DIAGNOSIS — R112 Nausea with vomiting, unspecified: Secondary | ICD-10-CM | POA: Diagnosis not present

## 2015-07-27 DIAGNOSIS — I10 Essential (primary) hypertension: Secondary | ICD-10-CM | POA: Insufficient documentation

## 2015-07-27 DIAGNOSIS — Z87891 Personal history of nicotine dependence: Secondary | ICD-10-CM | POA: Insufficient documentation

## 2015-07-27 DIAGNOSIS — R1110 Vomiting, unspecified: Secondary | ICD-10-CM

## 2015-07-27 LAB — COMPREHENSIVE METABOLIC PANEL
ALBUMIN: 4.4 g/dL (ref 3.5–5.0)
ALT: 10 U/L — AB (ref 14–54)
AST: 18 U/L (ref 15–41)
Alkaline Phosphatase: 48 U/L (ref 38–126)
Anion gap: 6 (ref 5–15)
BILIRUBIN TOTAL: 0.7 mg/dL (ref 0.3–1.2)
BUN: 7 mg/dL (ref 6–20)
CHLORIDE: 107 mmol/L (ref 101–111)
CO2: 24 mmol/L (ref 22–32)
CREATININE: 0.68 mg/dL (ref 0.44–1.00)
Calcium: 9.1 mg/dL (ref 8.9–10.3)
GFR calc Af Amer: >60 mL/min (ref >60)
GFR calc non Af Amer: >60 mL/min (ref >60)
GLUCOSE: 90 mg/dL (ref 65–99)
POTASSIUM: 4.2 mmol/L (ref 3.5–5.1)
Sodium: 137 mmol/L (ref 135–145)
Total Protein: 8 g/dL (ref 6.5–8.1)

## 2015-07-27 LAB — CBC WITH DIFFERENTIAL/PLATELET
BASOS ABS: 0 10*3/uL (ref 0.0–0.1)
BASOS PCT: 0 %
EOS ABS: 0.2 10*3/uL (ref 0.0–0.7)
Eosinophils Relative: 4 %
HEMATOCRIT: 39.1 % (ref 36.0–46.0)
Hemoglobin: 13.8 g/dL (ref 12.0–15.0)
Lymphocytes Relative: 44 %
Lymphs Abs: 3 10*3/uL (ref 0.7–4.0)
MCH: 28 pg (ref 26.0–34.0)
MCHC: 35.3 g/dL (ref 30.0–36.0)
MCV: 79.5 fL (ref 78.0–100.0)
Monocytes Absolute: 0.7 10*3/uL (ref 0.1–1.0)
Monocytes Relative: 10 %
NEUTROS ABS: 2.9 10*3/uL (ref 1.7–7.7)
NEUTROS PCT: 42 %
PLATELETS: 247 10*3/uL (ref 150–400)
RBC: 4.92 MIL/uL (ref 3.87–5.11)
RDW: 12.8 % (ref 11.5–15.5)
WBC: 6.8 10*3/uL (ref 4.0–10.5)

## 2015-07-27 MED ORDER — PROCHLORPERAZINE 25 MG RE SUPP: 25.0000 mg | Freq: Two times a day (BID) | RECTAL | Status: DC | PRN

## 2015-07-27 MED ORDER — PROCHLORPERAZINE EDISYLATE 5 MG/ML IJ SOLN
10.0000 mg | Freq: Once | INTRAMUSCULAR | Status: AC
  Administered 2015-07-27: 10 mg via INTRAVENOUS
  Filled 2015-07-27: qty 2

## 2015-07-27 MED ORDER — SODIUM CHLORIDE 0.9 % IV BOLUS (SEPSIS)
1000.0000 mL | Freq: Once | INTRAVENOUS | Status: AC
  Administered 2015-07-27: 1000 mL via INTRAVENOUS

## 2015-07-27 NOTE — ED Provider Notes (Signed)
CSN: [id number]     Arrival date & time 07/27/15  1816 History   First MD Initiated Contact with Patient 07/27/15 1940     Chief Complaint  Patient presents with  . Joint Pain     (Consider location/radiation/quality/duration/timing/severity/associated sxs/prior Treatment) Patient is a 30 y.o. female presenting with general illness. The history is provided by the patient (Patient complains of nausea vomiting and pain all over).  Illness Severity:  Moderate Onset quality:  Sudden Timing:  Constant Progression:  Worsening Chronicity:  Recurrent Associated symptoms: nausea and vomiting   Associated symptoms: no abdominal pain, no chest pain, no congestion, no cough, no diarrhea, no fatigue, no headaches and no rash     Past Medical History  Diagnosis Date  . Hypertension   . Asthma   . Pelvic inflammatory disease (PID)   . Fibromyalgia   . Chronic abdominal pain   . Migraine   . 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 Tubal ligation    . Kidney infections    . Wisdom tooth extraction     Family History  Problem Relation Age of Onset  . Hypertension Mother   . Diabetes Mother   . Heart failure Father    Social History  Substance Use Topics  . Smoking status: Former Smoker -- 0.50 packs/day    Types: Cigarettes    Quit date: 03/18/2015  . Smokeless tobacco: Never Used     Comment: Occasional smoker  . Alcohol Use: Yes     Comment: occasionally   OB History    Gravida Para Term Preterm AB TAB SAB Ectopic Multiple Living   4 4 1 3      4      Review of Systems  Constitutional: Negative for appetite change and fatigue.  HENT: Negative for congestion, ear discharge and sinus pressure.   Eyes: Negative for discharge.  Respiratory: Negative for cough.   Cardiovascular: Negative for chest pain.  Gastrointestinal: Positive for nausea and vomiting. Negative for abdominal pain and diarrhea.  Genitourinary: Negative for  frequency and hematuria.  Musculoskeletal: Negative for back pain.  Skin: Negative for rash.  Neurological: Negative for seizures and headaches.  Psychiatric/Behavioral: Negative for hallucinations.      Allergies  Metoclopramide; Tomato; Aspirin; Doxycycline; Hydromorphone hcl; Ibuprofen; Ondansetron; Other; Shellfish allergy; Toradol; Latex; and Tramadol  Home Medications   Prior to Admission medications   Medication Sig Start Date End Date Taking? Authorizing Provider  albuterol (PROVENTIL HFA;VENTOLIN HFA) 108 (90 BASE) MCG/ACT inhaler Inhale 2 puffs into the lungs every 6 (six) hours as needed for wheezing or shortness of breath. Reported on 01/13/2015 01/14/12   Consuello Masse, MD  B Complex Vitamins (VITAMIN B COMPLEX PO) Take 1 tablet by mouth daily.    Historical Provider, MD  buPROPion (WELLBUTRIN XL) 150 MG 24 hr tablet Take 150 mg by mouth daily. 05/26/15   Historical Provider, MD  butalbital-acetaminophen-caffeine (FIORICET) 50-325-40 MG tablet Take 1-2 tablets by mouth every 6 (six) hours as needed for headache. 05/19/15 05/18/16  Eber Hong, MD  Cholecalciferol (VITAMIN D PO) Take 1 tablet by mouth daily.    Historical Provider, MD  ciprofloxacin (CIPRO) 500 MG tablet Take 1 tablet (500 mg total) by mouth 2 (two) times daily. 06/07/15   Donnetta Hutching, MD  diphenhydrAMINE (BENADRYL) 25 mg capsule Take 25 mg by mouth every 6 (six) hours as needed for allergies.    Historical Provider, MD  DULoxetine (CYMBALTA) 60 MG capsule Take 60 mg by mouth daily.    Historical Provider, MD  folic acid (FOLVITE) 1 MG tablet Take 1 tablet (1 mg total) by mouth daily. 10/17/14   Jerald Kief, MD  gabapentin (NEURONTIN) 300 MG capsule Take 1 capsule (300 mg total) by mouth 3 (three) times daily. 11/18/14   Jaclyn Shaggy, MD  hydrOXYzine (VISTARIL) 25 MG capsule Take 25 mg by mouth 3 (three) times daily as needed.    Historical Provider, MD  LORazepam (ATIVAN) 1 MG tablet Take 1 mg by mouth 2 (two)  times daily as needed. For 30 days 05/07/15   Historical Provider, MD  metroNIDAZOLE (FLAGYL) 500 MG tablet Take 1 tablet (500 mg total) by mouth 4 (four) times daily. 06/07/15   Donnetta Hutching, MD  oxyCODONE (OXY IR/ROXICODONE) 5 MG immediate release tablet Take 5 mg by mouth 3 (three) times daily as needed. 06/04/15   Historical Provider, MD  oxyCODONE-acetaminophen (PERCOCET) 5-325 MG tablet Take 2 tablets by mouth every 4 (four) hours as needed. 06/07/15   Donnetta Hutching, MD  oxyCODONE-acetaminophen (PERCOCET) 5-325 MG tablet Take 2 tablets by mouth every 4 (four) hours as needed. 06/07/15   Donnetta Hutching, MD  prochlorperazine (COMPAZINE) 25 MG suppository Place 1 suppository (25 mg total) rectally every 12 (twelve) hours as needed for nausea or vomiting. 07/27/15   Bethann Berkshire, MD  promethazine (PHENERGAN) 25 MG tablet Take 1 tablet (25 mg total) by mouth every 6 (six) hours as needed for nausea or vomiting. 06/22/15   Eber Hong, MD  QUEtiapine (SEROQUEL) 50 MG tablet Take 50 mg by mouth at bedtime. 05/26/15   Historical Provider, MD  rOPINIRole (REQUIP) 2 MG tablet Take 2 mg by mouth daily. 05/26/15   Historical Provider, MD  tiZANidine (ZANAFLEX) 4 MG tablet Take 4 mg by mouth 2 (two) times daily as needed for muscle spasms.  05/26/15   Historical Provider, MD  traZODone (DESYREL) 100 MG tablet Take 100-200 mg by mouth at bedtime.    Historical Provider, MD   BP 162/92 mmHg  Pulse 84  Temp(Src) 98.3 F (36.8 C) (Oral)  Resp 20  Ht 5' (1.524 m)  Wt 173 lb (78.472 kg)  BMI 33.79 kg/m2  SpO2 100%  LMP 07/19/2015 Physical Exam  Constitutional: She is oriented to person, place, and time. She appears well-developed.  HENT:  Head: Normocephalic.  Eyes: Conjunctivae and EOM are normal. No scleral icterus.  Neck: Neck supple. No thyromegaly present.  Cardiovascular: Normal rate and regular rhythm.  Exam reveals no gallop and no friction rub.   No murmur heard. Pulmonary/Chest: No stridor. She has no wheezes.  She has no rales. She exhibits no tenderness.  Abdominal: She exhibits no distension. There is no tenderness. There is no rebound.  Musculoskeletal: Normal range of motion. She exhibits no edema.  Lymphadenopathy:    She has no cervical adenopathy.  Neurological: She is oriented to person, place, and time. She exhibits normal muscle tone. Coordination normal.  Skin: No rash noted. No erythema.  Psychiatric: She has a normal mood and affect. Her behavior is normal.    ED Course  Procedures (including critical care time) Labs Review Labs Reviewed  CBC WITH DIFFERENTIAL/PLATELET  COMPREHENSIVE METABOLIC PANEL    Imaging Review No results found. I have personally reviewed and evaluated these images and lab results as part of my medical decision-making.   EKG Interpretation None      MDM   Final diagnoses:  Acute vomiting    Patient with myalgias and vomiting. Patient was given Compazine and felt some better. Patient's stated that she had a death in the family and had to leave the hospital [facility]   Bethann Berkshire, MD 07/27/15 2055

## 2015-07-27 NOTE — Discharge Instructions (Signed)
Follow up with your md °

## 2015-07-27 NOTE — ED Notes (Signed)
Patient had death in family and stated she could not stay, EDP Zammit, informed.

## 2015-07-27 NOTE — ED Notes (Signed)
Pt states she has been hurting all over for the past few days.  States she feels she may be in sickle cell crisis, yet pt has no documented dx of sickle cell.

## 2015-08-12 ENCOUNTER — Encounter: Payer: Self-pay | Admitting: Nurse Practitioner

## 2015-08-12 ENCOUNTER — Ambulatory Visit (INDEPENDENT_AMBULATORY_CARE_PROVIDER_SITE_OTHER): Payer: Medicaid Other | Admitting: Nurse Practitioner

## 2015-08-12 VITALS — BP 126/89 | HR 71 | Temp 98.5°F | Ht 60.0 in | Wt 164.0 lb

## 2015-08-12 DIAGNOSIS — R112 Nausea with vomiting, unspecified: Secondary | ICD-10-CM

## 2015-08-12 DIAGNOSIS — K529 Noninfective gastroenteritis and colitis, unspecified: Secondary | ICD-10-CM | POA: Insufficient documentation

## 2015-08-12 DIAGNOSIS — R1084 Generalized abdominal pain: Secondary | ICD-10-CM

## 2015-08-12 NOTE — Progress Notes (Signed)
cc'ed to pcp °

## 2015-08-12 NOTE — Progress Notes (Signed)
Primary Care Physician:  Gwenyth Bender, MD Primary Gastroenterologist:  Dr. Jena Gauss  Chief Complaint  Patient presents with  . Abdominal Pain    x 4-5 months  . Nausea  . Emesis    HPI:   Tanya Krause is a 30 y.o. female who presents On referral from primary care for abdominal pain 4-5 months, nausea. PCP notes reviewed, last saw PCP on 07/02/2015. Per PCP notes patient with increased abdominal pain for the previous 2 weeks worse over her chronic pain. Had a CT scan in the emergency room which demonstrated colitis for which she was given Flagyl with minimal relief. Noted constipation, give herself fleets enema as well. Episodic nausea without vomiting.  ER notes reviewed from 06/07/2015 at which point she presented with acute abdominal pain, nausea, vomiting, bloody diarrhea for the day prior to presentation. Symptoms worse with palpation, moderately severe. Patient improved with IV fluids and pain management, no acute abdomen. Attempted outpatient management Cipro, Flagyl, pain and nausea medication.  CT scan completed in the emergency department on 06/07/2015 for left-sided abdominal pain with emesis found diffuse colitis of infectious or inflammatory nature most pronounced in the rectosigmoid colon and lower descending colon, remainder of abdominal pelvic CT unremarkable.  Today she confirms the above information. Completed antibiotics. Pain did not improved. Is having "excruciating pain" which is described as stabbing in the LLQ. Continued N/V, last episode of emesis last night when she tried to eat. States only thing that has helped is promethazine syrup which stays down better than the pills, suppositories burn. Unable to keep down food/fluids, last time she was able to eat and keep it down about 3 days ago, small meal. Otherwise trying crackers or "nabs" here and there "but my stomach wasn't hurting as bad then as it was not." Occasional hematemesis, not in the last 2-3 times. Pain is  worse when trying to lay down. Denies diarrhea. No hematochezia/melena. Not having many bowel movements because of poor appetite. Denies chest pain, dyspnea, dizziness, lightheadedness, syncope, near syncope. Denies any other upper or lower GI symptoms.   Took a dose of oxycodone which she has for her neck this morning; helped her neck "but didn't even take the edge off" of her abdominal pain.  Past Medical History:  Diagnosis Date  . Asthma   . Chronic abdominal pain   . Fibromyalgia   . 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 Hypertension   . Migraine   . Pelvic inflammatory disease (PID)     Past Surgical History:  Procedure Laterality Date  . kidney infections    . TUBAL LIGATION    . WISDOM TOOTH EXTRACTION      Current Outpatient Prescriptions  Medication Sig Dispense Refill  . albuterol (PROVENTIL HFA;VENTOLIN HFA) 108 (90 BASE) MCG/ACT inhaler Inhale 2 puffs into the lungs every 6 (six) hours as needed for wheezing or shortness of breath. Reported on 01/13/2015    . B Complex Vitamins (VITAMIN B COMPLEX PO) Take 1 tablet by mouth daily.    Marland Kitchen buPROPion (WELLBUTRIN XL) 150 MG 24 hr tablet Take 150 mg by mouth daily.  0  . Cholecalciferol (VITAMIN D PO) Take 1 tablet by mouth daily.    . DULoxetine (CYMBALTA) 60 MG capsule Take 60 mg by mouth daily.    . folic acid (FOLVITE) 1 MG tablet Take 1 tablet (1 mg total) by mouth daily. 30 tablet 2  . LORazepam (ATIVAN) 1 MG tablet Take 1 mg  by mouth 2 (two) times daily as needed. For 30 days  1  . oxyCODONE (OXY IR/ROXICODONE) 5 MG immediate release tablet Take 5 mg by mouth 3 (three) times daily as needed.  0  . rOPINIRole (REQUIP) 2 MG tablet Take 2 mg by mouth daily.  1  . traZODone (DESYREL) 100 MG tablet Take 100-200 mg by mouth at bedtime.     No current facility-administered medications for this visit.     Allergies as of 08/12/2015 - Review Complete 08/12/2015  Allergen Reaction Noted  . Metoclopramide Itching and Other  (See Comments) 01/14/2012  . Tomato Shortness Of Breath, Swelling, and Rash 06/19/2012  . Aspirin Hives 06/07/2010  . Doxycycline Nausea And Vomiting 01/14/2012  . Hydromorphone hcl Itching 10/10/2011  . Ibuprofen Hives 06/07/2010  . Ondansetron Nausea And Vomiting 04/06/2015  . Other Hives 08/11/2010  . Shellfish allergy Hives 06/07/2010  . Toradol [ketorolac tromethamine] Hives 12/30/2011  . Latex Itching and Rash 02/14/2011  . Tramadol Rash 01/14/2015    Family History  Problem Relation Age of Onset  . Hypertension Mother   . Diabetes Mother   . Heart failure Father   . Colon cancer Paternal Grandfather     Social History   Social History  . Marital status: Divorced    Spouse name: N/A  . Number of children: 4  . Years of education: 12   Occupational History  .      unemployed   Social History Main Topics  . Smoking status: Former Smoker    Packs/day: 0.50    Types: Cigarettes    Quit date: 03/18/2015  . Smokeless tobacco: Never Used     Comment: Occasional smoker  . Alcohol use Yes     Comment: occasionally  . Drug use: No  . Sexual activity: Yes    Birth control/ protection: Surgical   Other Topics Concern  . Not on file   Social History Narrative   Lives with parents, children   Caffeine use- coffee once a day, sodas x 2 a day    Review of Systems: General: Negative for anorexia, weight loss, fever, chills, fatigue. ENT: Negative for hoarseness, difficulty swallowing. CV: Negative for chest pain, angina, palpitations, peripheral edema.  Respiratory: Negative for dyspnea at rest, cough, sputum, wheezing.  GI: See history of present illness. MS: Admits chronic neck pain.  Derm: Negative for rash or itching.  Endo: Negative for unusual weight change.  Heme: Negative for bruising or bleeding. Allergy: Negative for rash or hives.    Physical Exam: BP 126/89   Pulse 71   Temp 98.5 F (36.9 C) (Oral)   Ht 5' (1.524 m)   Wt 164 lb (74.4 kg)    LMP 08/11/2015   BMI 32.03 kg/m  General:   Alert and oriented. Pleasant and cooperative. Well-nourished and well-developed. Appears fatigued and not feeling well. Head:  Normocephalic and atraumatic. Eyes:  Without icterus, sclera clear and conjunctiva pink.  Ears:  Normal auditory acuity. Cardiovascular:  S1, S2 present without murmurs appreciated. Extremities without clubbing or edema. Respiratory:  Clear to auscultation bilaterally. No wheezes, rales, or rhonchi. No distress.  Gastrointestinal:  +BS, soft, and non-distended. Significant/severe abdominal pain even to light palpation. No HSM noted. No rebound. Rectal:  Deferred  Musculoskalatal:  Symmetrical without gross deformities. Neurologic:  Alert and oriented x4;  grossly normal neurologically. Psych:  Alert and cooperative. Normal mood and affect. Heme/Lymph/Immune: No excessive bruising noted.    08/12/2015 11:09 AM  Disclaimer: This note was dictated with voice recognition software. Similar sounding words can inadvertently be transcribed and may not be corrected upon review.

## 2015-08-12 NOTE — Assessment & Plan Note (Signed)
Patient seen in the emergency department in May of this year for significant abdominal pain, nausea/vomiting. She was diagnosed based on CT imaging with colitis, nonspecific to infectious versus inflammatory. She was treated with a course of Cipro and Flagyl but this time states there is minimal improvement. Her symptoms have gotten progressively worse recently. Describes "excruciating" pain including nausea and vomiting. Very difficult to keep down food and fluids. Last significant by mouth intake was approximately 3 days ago. Has been trying crackers intermittently but notes significant nausea. Abdominal pain with significant worsening with even light palpation on exam. Appears ill. Concern for acute abdomen due to worsening colitis, possible bowel obstruction. May also need to consider possible ulcerative colitis.  I will refer her to the emergency department for further workup and triage.

## 2015-08-12 NOTE — Assessment & Plan Note (Signed)
Describes excruciating abdominal pain. Took her oxycodone that she has for her neck pain which improves her neck pain this morning, but not even "take the edge off" her abdominal pain. Recently diagnosed with colitis and treated with a course of antibiotics with minimal improvement. See further description above. I will refer her to the emergency department.

## 2015-08-12 NOTE — Patient Instructions (Signed)
1. Proceed to the emergency department. 2. Return for follow-up in 4 weeks. 3. Call if any worsening symptoms before then.

## 2015-08-12 NOTE — Assessment & Plan Note (Signed)
Significant nausea and vomiting, Phenergan helps. Has not been able to keep down significant food or fluids since Sunday. Attempts crackers and sips of liquids however has continued nausea. Last emesis last night. Notes a couple instances of hematemesis/blood tinged vomit, but this has not happened in the past 2-3 emesis episodes.  Concern for significant dehydration. She has sickle cell trait and/or sickle cell disease (the exact extent her disease is unclear) and there is a risk of sickle cell crisis with dehydration if she does indeed have full sickle cell disease.  I will refer her to the emergency department for further evaluation and workup/triage.

## 2015-08-26 ENCOUNTER — Emergency Department (HOSPITAL_COMMUNITY)
Admission: EM | Admit: 2015-08-26 | Discharge: 2015-08-26 | Disposition: A | Discharge status: Home / Self Care | Payer: Medicaid Other | Attending: Emergency Medicine | Admitting: Emergency Medicine

## 2015-08-26 ENCOUNTER — Encounter (HOSPITAL_COMMUNITY): Payer: Self-pay | Admitting: *Deleted

## 2015-08-26 DIAGNOSIS — Z79899 Other long term (current) drug therapy: Secondary | ICD-10-CM | POA: Insufficient documentation

## 2015-08-26 DIAGNOSIS — Z87891 Personal history of nicotine dependence: Secondary | ICD-10-CM | POA: Diagnosis not present

## 2015-08-26 DIAGNOSIS — J45909 Unspecified asthma, uncomplicated: Secondary | ICD-10-CM | POA: Diagnosis not present

## 2015-08-26 DIAGNOSIS — I10 Essential (primary) hypertension: Secondary | ICD-10-CM | POA: Insufficient documentation

## 2015-08-26 DIAGNOSIS — R42 Dizziness and giddiness: Secondary | ICD-10-CM | POA: Insufficient documentation

## 2015-08-26 DIAGNOSIS — G43909 Migraine, unspecified, not intractable, without status migrainosus: Secondary | ICD-10-CM | POA: Diagnosis not present

## 2015-08-26 DIAGNOSIS — G43009 Migraine without aura, not intractable, without status migrainosus: Secondary | ICD-10-CM

## 2015-08-26 MED ORDER — MECLIZINE HCL 25 MG PO TABS: 25.0000 mg | ORAL_TABLET | Freq: Three times a day (TID) | ORAL | 0 refills | Status: DC | PRN

## 2015-08-26 MED ORDER — VALPROATE SODIUM 500 MG/5ML IV SOLN
500.0000 mg | Freq: Once | INTRAVENOUS | Status: DC
  Filled 2015-08-26: qty 5

## 2015-08-26 MED ORDER — PROMETHAZINE HCL 25 MG/ML IJ SOLN
25.0000 mg | Freq: Once | INTRAMUSCULAR | Status: DC
  Filled 2015-08-26: qty 1

## 2015-08-26 MED ORDER — MECLIZINE HCL 12.5 MG PO TABS
25.0000 mg | ORAL_TABLET | Freq: Once | ORAL | Status: AC
  Administered 2015-08-26: 25 mg via ORAL
  Filled 2015-08-26: qty 2

## 2015-08-26 MED ORDER — DIPHENHYDRAMINE HCL 50 MG/ML IJ SOLN
25.0000 mg | Freq: Once | INTRAMUSCULAR | Status: DC
  Filled 2015-08-26: qty 1

## 2015-08-26 MED ORDER — PROMETHAZINE HCL 25 MG/ML IJ SOLN
25.0000 mg | Freq: Once | INTRAMUSCULAR | Status: AC
Start: 2015-08-26 — End: 2015-08-26
  Administered 2015-08-26: 25 mg via INTRAMUSCULAR

## 2015-08-26 MED ORDER — SODIUM CHLORIDE 0.9 % IV BOLUS (SEPSIS): 1000.0000 mL | Freq: Once | INTRAVENOUS | Status: DC

## 2015-08-26 MED ORDER — SCOPOLAMINE 1 MG/3DAYS TD PT72
1.0000 | MEDICATED_PATCH | TRANSDERMAL | Status: DC
  Administered 2015-08-26: 1.5 mg via TRANSDERMAL
  Filled 2015-08-26: qty 1

## 2015-08-26 MED ORDER — DEXAMETHASONE SODIUM PHOSPHATE 10 MG/ML IJ SOLN
10.0000 mg | Freq: Once | INTRAMUSCULAR | Status: DC
  Filled 2015-08-26: qty 1

## 2015-08-26 MED ORDER — DIPHENHYDRAMINE HCL 50 MG/ML IJ SOLN
50.0000 mg | Freq: Once | INTRAMUSCULAR | Status: AC
  Administered 2015-08-26: 50 mg via INTRAMUSCULAR

## 2015-08-26 NOTE — ED Notes (Signed)
Unable to obtain IV access after attempts x 2. Dr Fayrene Fearing attempted IV access via ultrasound x 2 unsuccessfully. Patient states "they usually have to give me a PICC line when I come."

## 2015-08-26 NOTE — ED Triage Notes (Signed)
Pt comes in with multiple complaints. Pt states that she had head a migraine for "a minute" and will no specify how long, she states she couldn't keep down fluids starting yesterday. She feels "achy all over."  Pt BP 146/112 in triage (taken twice)

## 2015-08-26 NOTE — ED Provider Notes (Signed)
[facility]EM[facility]Y [facility]PT Provider Note   CSN: [id number] Arrival date & time: 08/26/15  2458  First Provider Contact:   First MD Initiated Contact with Patient 08/26/15 [phone]     By signing my name below, I, Placido Sou, attest that this documentation has been prepared under the direction and in the presence of Rolland Porter, MD. Electronically Signed: Placido Sou, ED Scribe. 08/26/15. 10:11 AM.   History   Chief Complaint Chief Complaint  Patient presents with  . Migraine  . Emesis    HPI HPI Comments: Tanya Krause is a 30 y.o. female with a PMHx of HTN and has a care plan in place who presents to the Emergency Department complaining of worsening, moderate, migraine x 1 day. Pt states she has chronic pain in her bilateral legs and back further noting she is experiencing a migraine which is also a chronic issue. Beginning yesterday she began experiencing n/v, photophobia and dizziness noting "I feel as if I am going to pass out". Pt states her migraine is worse than nml and worsens with eye movement. She typically takes oxycodone 3x per day which is rx by her pain clinic. Pt states she saw her psychiatrist this morning and was rx phenergan which she denies she has taken as of yet. Her next appointment with her pain clinic is 09/03/2015. She states Asprin and Toradol cause redness and itchiness and notes she takes benadryl when taking dilaudid. She denies any other associated symptoms at this time.   The history is provided by the patient. No language interpreter was used.    Past Medical History:  Diagnosis Date  . Asthma   . Chronic abdominal pain   . Fibromyalgia   . 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 Hypertension   . Migraine   . Pelvic inflammatory disease (PID)     Patient Active Problem List   Diagnosis Date Noted  . Colitis 08/12/2015  . Oral thrush 11/18/2014  . B12 deficiency 11/17/2014  . Chronic pain syndrome 11/17/2014  . Numbness 11/17/2014  . Cephalalgia  11/17/2014  . Intractable chronic migraine without aura and without status migrainosus 11/17/2014  . Chronic migraine 10/11/2014  . Fibromyalgia 10/11/2014  . Lower extremity weakness 10/11/2014  . Vitamin B12 deficiency 10/11/2014  . Anxiety state 10/11/2014  . Nausea & vomiting 10/08/2014  . Headache, tension type, chronic 09/07/2012  . Abdominal pain 06/15/2012    Past Surgical History:  Procedure Laterality Date  . kidney infections    . TUBAL LIGATION    . WISDOM TOOTH EXTRACTION      OB History    Gravida Para Term Preterm AB Living   4 4 1 3   4    SAB TAB Ectopic Multiple Live Births           4       Home Medications    Prior to Admission medications   Medication Sig Start Date End Date Taking? Authorizing Provider  albuterol (PROVENTIL HFA;VENTOLIN HFA) 108 (90 BASE) MCG/ACT inhaler Inhale 2 puffs into the lungs every 6 (six) hours as needed for wheezing or shortness of breath. Reported on 01/13/2015 01/14/12  Yes 01/16/12, MD  B Complex Vitamins (VITAMIN B COMPLEX PO) Take 1 tablet by mouth daily.   Yes Historical Provider, MD  Cholecalciferol (VITAMIN D PO) Take 1 tablet by mouth daily.   Yes Historical Provider, MD  DULoxetine (CYMBALTA) 60 MG capsule Take 60 mg by mouth daily.   Yes Historical  Provider, MD  ENSURE (ENSURE) Take 237 mLs by mouth.   Yes Historical Provider, MD  folic acid (FOLVITE) 1 MG tablet Take 1 tablet (1 mg total) by mouth daily. 10/17/14  Yes Jerald Kief, MD  nicotine (NICODERM CQ - DOSED IN MG/24 HOURS) 21 mg/24hr patch Place 21 mg onto the skin daily.   Yes Historical Provider, MD  oxyCODONE (OXY IR/ROXICODONE) 5 MG immediate release tablet Take 5 mg by mouth 3 (three) times daily as needed. 06/04/15  Yes Historical Provider, MD  meclizine (ANTIVERT) 25 MG tablet Take 1 tablet (25 mg total) by mouth 3 (three) times daily as needed for dizziness. 08/26/15   Rolland Porter, MD    Family History Family History  Problem Relation Age of  Onset  . Hypertension Mother   . Diabetes Mother   . Heart failure Father   . Colon cancer Paternal Grandfather     Social History Social History  Substance Use Topics  . Smoking status: Former Smoker    Packs/day: 0.50    Types: Cigarettes    Quit date: 03/18/2015  . Smokeless tobacco: Never Used     Comment: Occasional smoker  . Alcohol use Yes     Comment: occasionally     Allergies   Metoclopramide; Tomato; Aspirin; Doxycycline; Hydromorphone hcl; Ibuprofen; Ondansetron; Other; Shellfish allergy; Toradol [ketorolac tromethamine]; Latex; and Tramadol   Review of Systems Review of Systems  Constitutional: Negative for appetite change, chills, diaphoresis, fatigue and fever.  HENT: Negative for mouth sores, sore throat and trouble swallowing.   Eyes: Negative for visual disturbance.  Respiratory: Negative for cough, chest tightness, shortness of breath and wheezing.   Cardiovascular: Negative for chest pain.  Gastrointestinal: Negative for abdominal distention, abdominal pain, diarrhea, nausea and vomiting.  Endocrine: Negative for polydipsia, polyphagia and polyuria.  Genitourinary: Negative for dysuria, frequency and hematuria.  Musculoskeletal: Positive for arthralgias, back pain and myalgias. Negative for gait problem.  Skin: Negative for color change, pallor and rash.  Neurological: Positive for dizziness and headaches. Negative for syncope and light-headedness.  Hematological: Does not bruise/bleed easily.  Psychiatric/Behavioral: Negative for behavioral problems and confusion.   Physical Exam Updated Vital Signs BP 133/97 (BP Location: Left Arm)   Pulse 88   Temp 97.6 F (36.4 C) (Oral)   Resp 17   Ht 5' (1.524 m)   Wt 172 lb (78 kg)   LMP 08/11/2015   SpO2 100%   BMI 33.59 kg/m   Physical Exam  Constitutional: She is oriented to person, place, and time. She appears well-developed and well-nourished. No distress.  HENT:  Head: Normocephalic.  Complains  of severe pain with eye movement but no nystagmus noted  Eyes: Conjunctivae are normal. Pupils are equal, round, and reactive to light. No scleral icterus.  Neck: Normal range of motion. Neck supple. No thyromegaly present.  Cardiovascular: Normal rate and regular rhythm.  Exam reveals no gallop and no friction rub.   No murmur heard. Pulmonary/Chest: Effort normal and breath sounds normal. No respiratory distress. She has no wheezes. She has no rales.  Abdominal: Soft. Bowel sounds are normal. She exhibits no distension. There is no tenderness. There is no rebound.  Musculoskeletal: Normal range of motion.  Neurological: She is alert and oriented to person, place, and time.  Skin: Skin is warm and dry. No rash noted.  Psychiatric: She has a normal mood and affect. Her behavior is normal.   ED Treatments / Results  Labs (all labs ordered are  listed, but only abnormal results are displayed) Labs Reviewed - No data to display  EKG  EKG Interpretation None       Radiology No results found.  Procedures Procedures  DIAGNOSTIC STUDIES: Oxygen Saturation is 100% on RA, normal by my interpretation.    COORDINATION OF CARE: 10:10 AM Discussed next steps with pt. Pt verbalized understanding and is agreeable with the plan.    Medications Ordered in ED Medications  scopolamine (TRANSDERM-SCOP) 1 MG/3DAYS 1.5 mg (1.5 mg Transdermal Patch Applied 08/26/15 1229)  meclizine (ANTIVERT) tablet 25 mg (25 mg Oral Given 08/26/15 1224)  diphenhydrAMINE (BENADRYL) injection 50 mg (50 mg Intramuscular Given 08/26/15 1226)  promethazine (PHENERGAN) injection 25 mg (25 mg Intramuscular Given 08/26/15 1225)     Initial Impression / Assessment and Plan / ED Course  I have reviewed the triage vital signs and the nursing notes.  Pertinent labs & imaging results that were available during my care of the patient were reviewed by me and considered in my medical decision making (see chart for  details).  Clinical Course    I personally performed the services described in this documentation, which was scribed in my presence. The recorded information has been reviewed and is accurate.   After multiple attempts Axis was obtained. Patient given multiple IV medications including Phenergan and Benadryl. The scopolamine patches placed. Given by mouth meclizine. Her symptoms did improve. States her headache was on before. Her vertigo is improving and to take by mouth liquids. She is discharged home to continue on scopolamine patch, Phenergan at home, when necessary meclizine, vertigo precautions. No driving until 24 hours without symptoms. Take the medicines until 24 hours without symptoms. Encouraged primary care follow-up this week face-to-face.  Final Clinical Impressions(s) / ED Diagnoses   Final diagnoses:  Migraine without aura and without status migrainosus, not intractable  Vertigo    New Prescriptions New Prescriptions   MECLIZINE (ANTIVERT) 25 MG TABLET    Take 1 tablet (25 mg total) by mouth 3 (three) times daily as needed for dizziness.     Rolland Porter, MD 08/26/15 [phone]

## 2015-08-26 NOTE — ED Notes (Signed)
In to discharge pt. Pt not in room 

## 2015-09-15 ENCOUNTER — Ambulatory Visit: Payer: Medicaid Other | Admitting: Nurse Practitioner

## 2015-09-15 ENCOUNTER — Encounter: Payer: Self-pay | Admitting: Nurse Practitioner

## 2015-09-15 ENCOUNTER — Telehealth: Payer: Self-pay | Admitting: Nurse Practitioner

## 2015-09-15 NOTE — Telephone Encounter (Signed)
PATIENT WAS A NO SHOW AND LETTER SENT  °

## 2015-09-17 NOTE — Telephone Encounter (Signed)
Noted  

## 2016-01-28 ENCOUNTER — Telehealth: Payer: Self-pay | Admitting: Internal Medicine

## 2016-01-28 DIAGNOSIS — R110 Nausea: Secondary | ICD-10-CM

## 2016-01-28 MED ORDER — PROMETHAZINE HCL 6.25 MG/5ML PO SYRP: 12.5000 mg | ORAL_SOLUTION | Freq: Four times a day (QID) | ORAL | 1 refills | Status: DC | PRN

## 2016-01-28 NOTE — Telephone Encounter (Signed)
Tanya Krause, can we send this in for her?

## 2016-01-28 NOTE — Telephone Encounter (Signed)
[phone] PATIENT CALLED AND ASKED IF WE COULD PRESCRIBE HER LIQUID PHENAGRIN FOR HER NAUSEA.  SHE HAS NOT BEEN HERE IN A WHILE AND I MADE HER AN APPOINTMENT FOR THE END OF THE MONTH .  PLEASE ADVISE.

## 2016-01-28 NOTE — Telephone Encounter (Signed)
Pt said she has been having some problems swallowing her pills. She has been crushing all of her other medications. She also said the liquid was easier for her to keep down. Pt uses [facility] in [place], I have changed the pharmacy to this in epic.

## 2016-01-28 NOTE — Telephone Encounter (Signed)
Can we find out why she specifically needs liquid? All of her other meds are pills.

## 2016-01-28 NOTE — Telephone Encounter (Signed)
Please notify the patient, Rx sent to pharmacy.

## 2016-01-28 NOTE — Telephone Encounter (Signed)
Pt is aware.  

## 2016-01-28 NOTE — Addendum Note (Signed)
Addended by: Delane Ginger, Telina Kleckley A on: 01/28/2016 03:25 PM   Modules accepted: Orders

## 2016-02-16 ENCOUNTER — Encounter: Payer: Self-pay | Admitting: Nurse Practitioner

## 2016-02-16 ENCOUNTER — Ambulatory Visit: Payer: Medicaid Other | Admitting: Nurse Practitioner

## 2016-02-16 ENCOUNTER — Telehealth: Payer: Self-pay | Admitting: Nurse Practitioner

## 2016-02-16 NOTE — Telephone Encounter (Signed)
Patient was a no show and letter sent  °

## 2016-02-16 NOTE — Progress Notes (Deleted)
Referring Provider: Gwenyth Bender, MD Primary Care Physician:  Gwenyth Bender, MD Primary GI:  Dr. Jena Gauss  No chief complaint on file.   HPI:   Tanya Krause is a 31 y.o. female who presents for follow-up on nausea and vomiting. Patient was last seen in our office 08/12/2015 at which point it was documented that she presented to the ER 06/07/2015 with abdominal pain, nausea, vomiting, bloody diarrhea, moderately severe. Improvement with IV fluids and pain management, discharged on Cipro, Flagyl, pain medicine, nausea medicine. CT of the abdomen in the emergency department found diffuse colitis of infectious or inflammatory nature most pronounced in the rectosigmoid colon and lower descending colon. At the time of her last visit she had completed her antibiotics without improvement in pain described as "excruciating" and stabbing in the left lower quadrant with continued nausea and vomiting. The only relief discomfort and promethazine syrup. She noted and inability to keep down food/fluids and last meal was 3 days prior. Occasional hematemesis. Oxycodone did not help her abdominal pain. There is expressed concern for significant dehydration with risk of sickle cell disease/trait decompensation. Abdominal pain worsened/severe. She was referred to the emergency department.  It appears she did not go to the ER as recommended. She was a no-show for follow-up office visit. She called our office approximately 2 weeks ago asking for liquid Phenergan and prescription was sent to her pharmacy and a follow-up visit made for today.  Today she states   Past Medical History:  Diagnosis Date  . Asthma   . Chronic abdominal pain   . Fibromyalgia   . 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 Hypertension   . Migraine   . Pelvic inflammatory disease (PID)     Past Surgical History:  Procedure Laterality Date  . kidney infections    . TUBAL LIGATION    . WISDOM TOOTH EXTRACTION      Current Outpatient  Prescriptions  Medication Sig Dispense Refill  . albuterol (PROVENTIL HFA;VENTOLIN HFA) 108 (90 BASE) MCG/ACT inhaler Inhale 2 puffs into the lungs every 6 (six) hours as needed for wheezing or shortness of breath. Reported on 01/13/2015    . B Complex Vitamins (VITAMIN B COMPLEX PO) Take 1 tablet by mouth daily.    . Cholecalciferol (VITAMIN D PO) Take 1 tablet by mouth daily.    . DULoxetine (CYMBALTA) 60 MG capsule Take 60 mg by mouth daily.    Marland Kitchen ENSURE (ENSURE) Take 237 mLs by mouth.    . folic acid (FOLVITE) 1 MG tablet Take 1 tablet (1 mg total) by mouth daily. 30 tablet 2  . meclizine (ANTIVERT) 25 MG tablet Take 1 tablet (25 mg total) by mouth 3 (three) times daily as needed for dizziness. 30 tablet 0  . nicotine (NICODERM CQ - DOSED IN MG/24 HOURS) 21 mg/24hr patch Place 21 mg onto the skin daily.    Marland Kitchen oxyCODONE (OXY IR/ROXICODONE) 5 MG immediate release tablet Take 5 mg by mouth 3 (three) times daily as needed.  0  . promethazine (PHENERGAN) 6.25 MG/5ML syrup Take 10 mLs (12.5 mg total) by mouth every 6 (six) hours as needed for nausea or vomiting. 240 mL 1   No current facility-administered medications for this visit.     Allergies as of 02/16/2016 - Review Complete 08/26/2015  Allergen Reaction Noted  . Metoclopramide Itching and Other (See Comments) 01/14/2012  . Tomato Shortness Of Breath, Swelling, and Rash 06/19/2012  . Aspirin Hives 06/07/2010  .  Doxycycline Nausea And Vomiting 01/14/2012  . Hydromorphone hcl Itching 10/10/2011  . Ibuprofen Hives 06/07/2010  . Ondansetron Nausea And Vomiting 04/06/2015  . Other Hives 08/11/2010  . Shellfish allergy Hives 06/07/2010  . Toradol [ketorolac tromethamine] Hives 12/30/2011  . Latex Itching and Rash 02/14/2011  . Tramadol Rash 01/14/2015    Family History  Problem Relation Age of Onset  . Hypertension Mother   . Diabetes Mother   . Heart failure Father   . Colon cancer Paternal Grandfather     Social History    Social History  . Marital status: Divorced    Spouse name: N/A  . Number of children: 4  . Years of education: 12   Occupational History  .      unemployed   Social History Main Topics  . Smoking status: Former Smoker    Packs/day: 0.50    Types: Cigarettes    Quit date: 03/18/2015  . Smokeless tobacco: Never Used     Comment: Occasional smoker  . Alcohol use Yes     Comment: occasionally  . Drug use: No  . Sexual activity: Yes    Birth control/ protection: Surgical   Other Topics Concern  . Not on file   Social History Narrative   Lives with parents, children   Caffeine use- coffee once a day, sodas x 2 a day    Review of Systems: General: Negative for anorexia, weight loss, fever, chills, fatigue, weakness. Eyes: Negative for vision changes.  ENT: Negative for hoarseness, difficulty swallowing , nasal congestion. CV: Negative for chest pain, angina, palpitations, dyspnea on exertion, peripheral edema.  Respiratory: Negative for dyspnea at rest, dyspnea on exertion, cough, sputum, wheezing.  GI: See history of present illness. GU:  Negative for dysuria, hematuria, urinary incontinence, urinary frequency, nocturnal urination.  MS: Negative for joint pain, low back pain.  Derm: Negative for rash or itching.  Neuro: Negative for weakness, abnormal sensation, seizure, frequent headaches, memory loss, confusion.  Psych: Negative for anxiety, depression, suicidal ideation, hallucinations.  Endo: Negative for unusual weight change.  Heme: Negative for bruising or bleeding. Allergy: Negative for rash or hives.   Physical Exam: There were no vitals taken for this visit. General:   Alert and oriented. Pleasant and cooperative. Well-nourished and well-developed.  Head:  Normocephalic and atraumatic. Eyes:  Without icterus, sclera clear and conjunctiva pink.  Ears:  Normal auditory acuity. Mouth:  No deformity or lesions, oral mucosa pink.  Throat/Neck:  Supple, without  mass or thyromegaly. Cardiovascular:  S1, S2 present without murmurs appreciated. Normal pulses noted. Extremities without clubbing or edema. Respiratory:  Clear to auscultation bilaterally. No wheezes, rales, or rhonchi. No distress.  Gastrointestinal:  +BS, soft, non-tender and non-distended. No HSM noted. No guarding or rebound. No masses appreciated.  Rectal:  Deferred  Musculoskalatal:  Symmetrical without gross deformities. Normal posture. Skin:  Intact without significant lesions or rashes. Neurologic:  Alert and oriented x4;  grossly normal neurologically. Psych:  Alert and cooperative. Normal mood and affect. Heme/Lymph/Immune: No significant cervical adenopathy. No excessive bruising noted.    02/16/2016 8:29 AM   Disclaimer: This note was dictated with voice recognition software. Similar sounding words can inadvertently be transcribed and may not be corrected upon review.

## 2016-02-16 NOTE — Telephone Encounter (Signed)
Note:

## 2016-02-24 ENCOUNTER — Emergency Department (HOSPITAL_COMMUNITY)
Admission: EM | Admit: 2016-02-24 | Discharge: 2016-02-24 | Disposition: A | Discharge status: Home / Self Care | Payer: Medicaid Other | Attending: Emergency Medicine | Admitting: Emergency Medicine

## 2016-02-24 ENCOUNTER — Encounter (HOSPITAL_COMMUNITY): Payer: Self-pay | Admitting: *Deleted

## 2016-02-24 DIAGNOSIS — N3001 Acute cystitis with hematuria: Secondary | ICD-10-CM | POA: Insufficient documentation

## 2016-02-24 DIAGNOSIS — R112 Nausea with vomiting, unspecified: Secondary | ICD-10-CM | POA: Diagnosis present

## 2016-02-24 DIAGNOSIS — J45909 Unspecified asthma, uncomplicated: Secondary | ICD-10-CM | POA: Insufficient documentation

## 2016-02-24 DIAGNOSIS — Z87891 Personal history of nicotine dependence: Secondary | ICD-10-CM | POA: Diagnosis not present

## 2016-02-24 DIAGNOSIS — N3000 Acute cystitis without hematuria: Secondary | ICD-10-CM

## 2016-02-24 DIAGNOSIS — Z79899 Other long term (current) drug therapy: Secondary | ICD-10-CM | POA: Diagnosis not present

## 2016-02-24 DIAGNOSIS — I10 Essential (primary) hypertension: Secondary | ICD-10-CM | POA: Insufficient documentation

## 2016-02-24 DIAGNOSIS — Z91040 Latex allergy status: Secondary | ICD-10-CM | POA: Insufficient documentation

## 2016-02-24 LAB — CBC
HEMATOCRIT: 36.2 % (ref 36.0–46.0)
Hemoglobin: 12.6 g/dL (ref 12.0–15.0)
MCH: 28.7 pg (ref 26.0–34.0)
MCHC: 34.8 g/dL (ref 30.0–36.0)
MCV: 82.5 fL (ref 78.0–100.0)
PLATELETS: DECREASED 10*3/uL (ref 150–400)
RBC: 4.39 MIL/uL (ref 3.87–5.11)
RDW: 13.5 % (ref 11.5–15.5)
WBC: 6.3 10*3/uL (ref 4.0–10.5)

## 2016-02-24 LAB — URINALYSIS, ROUTINE W REFLEX MICROSCOPIC
Bilirubin Urine: NEGATIVE
GLUCOSE, UA: NEGATIVE mg/dL
Hgb urine dipstick: NEGATIVE
KETONES UR: NEGATIVE mg/dL
Nitrite: POSITIVE — AB
Protein, ur: 30 mg/dL — AB
SPECIFIC GRAVITY, URINE: 1.026 (ref 1.005–1.030)
pH: 6 (ref 5.0–8.0)

## 2016-02-24 LAB — COMPREHENSIVE METABOLIC PANEL
ALBUMIN: 3.5 g/dL (ref 3.5–5.0)
ALT: 10 U/L — AB (ref 14–54)
AST: 17 U/L (ref 15–41)
Alkaline Phosphatase: 35 U/L — ABNORMAL LOW (ref 38–126)
Anion gap: 6 (ref 5–15)
BUN: 7 mg/dL (ref 6–20)
CHLORIDE: 108 mmol/L (ref 101–111)
CO2: 24 mmol/L (ref 22–32)
CREATININE: 0.67 mg/dL (ref 0.44–1.00)
Calcium: 8.7 mg/dL — ABNORMAL LOW (ref 8.9–10.3)
GFR calc Af Amer: >60 mL/min (ref >60)
GFR calc non Af Amer: >60 mL/min (ref >60)
GLUCOSE: 110 mg/dL — AB (ref 65–99)
POTASSIUM: 3.6 mmol/L (ref 3.5–5.1)
Sodium: 138 mmol/L (ref 135–145)
Total Bilirubin: 0.5 mg/dL (ref 0.3–1.2)
Total Protein: 6.4 g/dL — ABNORMAL LOW (ref 6.5–8.1)

## 2016-02-24 LAB — PREGNANCY, URINE: Preg Test, Ur: NEGATIVE

## 2016-02-24 LAB — LIPASE, BLOOD: LIPASE: 18 U/L (ref 11–51)

## 2016-02-24 MED ORDER — ACETAMINOPHEN 500 MG PO TABS
1000.0000 mg | ORAL_TABLET | Freq: Once | ORAL | Status: AC
  Administered 2016-02-24: 1000 mg via ORAL
  Filled 2016-02-24: qty 2

## 2016-02-24 MED ORDER — PROMETHAZINE HCL 25 MG/ML IJ SOLN
25.0000 mg | Freq: Once | INTRAMUSCULAR | Status: AC
  Administered 2016-02-24: 25 mg via INTRAMUSCULAR
  Filled 2016-02-24: qty 1

## 2016-02-24 MED ORDER — SULFAMETHOXAZOLE-TRIMETHOPRIM 800-160 MG PO TABS: 1.0000 | ORAL_TABLET | Freq: Two times a day (BID) | ORAL | 0 refills | Status: AC

## 2016-02-24 MED ORDER — SULFAMETHOXAZOLE-TRIMETHOPRIM 800-160 MG PO TABS
1.0000 | ORAL_TABLET | Freq: Once | ORAL | Status: AC
  Administered 2016-02-24: 1 via ORAL
  Filled 2016-02-24: qty 1

## 2016-02-24 MED ORDER — PROMETHAZINE HCL 25 MG PO TABS: 25.0000 mg | ORAL_TABLET | Freq: Four times a day (QID) | ORAL | 0 refills | Status: DC | PRN

## 2016-02-24 NOTE — ED Triage Notes (Signed)
Pt comes in with multiple symptoms. Pt states she began having nausea and vomiting 2 days ago. At that time she began having bilateral flank pain and malodorous urine. Pt states she had a light amount of blood in her urine. She also states she is having pain all over her body and she feels like she is having sickle cell pain. She has had increased fatigue as well, with patient having a fall yesterday. Pt is alert and oriented in triage.

## 2016-02-24 NOTE — ED Provider Notes (Signed)
[facility]-EMERGENCY DEPT Provider Note   CSN: [id number] Arrival date & time: 02/24/16  1157     History   Chief Complaint Chief Complaint  Patient presents with  . Flank Pain  . Emesis    HPI El Tanya Krause is a 31 y.o. female.  Pt presents to the ED today with n/v, flank pain, fever, fatigue, body swelling.  Pt told the triage nurse that she is having sickle cell pain; however, she only has sickle cell trait.  The pt has been taking tylenol for her sx.  The pt has a care plan as she has had frequent visits to the ED in the past for pain related complaints.      Past Medical History:  Diagnosis Date  . Asthma   . Chronic abdominal pain   . Fibromyalgia   . 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 Hypertension   . Migraine   . Pelvic inflammatory disease (PID)     Patient Active Problem List   Diagnosis Date Noted  . Colitis 08/12/2015  . Oral thrush 11/18/2014  . B12 deficiency 11/17/2014  . Chronic pain syndrome 11/17/2014  . Numbness 11/17/2014  . Cephalalgia 11/17/2014  . Intractable chronic migraine without aura and without status migrainosus 11/17/2014  . Chronic migraine 10/11/2014  . Fibromyalgia 10/11/2014  . Lower extremity weakness 10/11/2014  . Vitamin B12 deficiency 10/11/2014  . Anxiety state 10/11/2014  . Nausea & vomiting 10/08/2014  . Headache, tension type, chronic 09/07/2012  . Abdominal pain 06/15/2012    Past Surgical History:  Procedure Laterality Date  . kidney infections    . TUBAL LIGATION    . WISDOM TOOTH EXTRACTION      OB History    Gravida Para Term Preterm AB Living   4 4 1 3   4    SAB TAB Ectopic Multiple Live Births           4       Home Medications    Prior to Admission medications   Medication Sig Start Date End Date Taking? Authorizing Provider  albuterol (PROVENTIL HFA;VENTOLIN HFA) 108 (90 BASE) MCG/ACT inhaler Inhale 2 puffs into the lungs every 6 (six) hours as needed for wheezing or shortness of breath.  Reported on 01/13/2015 01/14/12  Yes 01/16/12, MD  B Complex Vitamins (VITAMIN B COMPLEX PO) Take 1 tablet by mouth daily.   Yes Historical Provider, MD  Cholecalciferol (VITAMIN D PO) Take 1 tablet by mouth daily.   Yes Historical Provider, MD  DULoxetine (CYMBALTA) 60 MG capsule Take 60 mg by mouth daily.   Yes Historical Provider, MD  folic acid (FOLVITE) 1 MG tablet Take 1 tablet (1 mg total) by mouth daily. 10/17/14  Yes 10/19/14, MD  meclizine (ANTIVERT) 25 MG tablet Take 1 tablet (25 mg total) by mouth 3 (three) times daily as needed for dizziness. 08/26/15  Yes 10/26/15, MD  nicotine (NICODERM CQ - DOSED IN MG/24 HOURS) 21 mg/24hr patch Place 21 mg onto the skin daily.   Yes Historical Provider, MD  oxyCODONE (OXY IR/ROXICODONE) 5 MG immediate release tablet Take 5 mg by mouth 3 (three) times daily as needed. 06/04/15  Yes Historical Provider, MD  propranolol (INDERAL) 40 MG tablet Take 40 mg by mouth 2 (two) times daily.   Yes Historical Provider, MD  promethazine (PHENERGAN) 25 MG tablet Take 1 tablet (25 mg total) by mouth every 6 (six) hours as needed for nausea or vomiting. 02/24/16  Jacalyn Lefevre, MD  sulfamethoxazole-trimethoprim (BACTRIM DS,SEPTRA DS) 800-160 MG tablet Take 1 tablet by mouth 2 (two) times daily. 02/24/16 03/02/16  Jacalyn Lefevre, MD    Family History Family History  Problem Relation Age of Onset  . Hypertension Mother   . Diabetes Mother   . Heart failure Father   . Colon cancer Paternal Grandfather     Social History Social History  Substance Use Topics  . Smoking status: Former Smoker    Packs/day: 0.50    Types: Cigarettes    Quit date: 03/18/2015  . Smokeless tobacco: Never Used     Comment: Occasional smoker  . Alcohol use Yes     Comment: occasionally     Allergies   Metoclopramide; Tomato; Aspirin; Doxycycline; Hydromorphone hcl; Ibuprofen; Ondansetron; Other; Shellfish allergy; Toradol [ketorolac tromethamine]; Latex; and  Tramadol   Review of Systems Review of Systems  Constitutional: Positive for chills and fever.  Genitourinary: Positive for dysuria and hematuria.  Neurological: Positive for weakness.  All other systems reviewed and are negative.    Physical Exam Updated Vital Signs BP 107/72   Pulse 83   Temp 98.3 F (36.8 C) (Oral)   Resp 16   Ht 5' (1.524 m)   Wt 173 lb (78.5 kg)   LMP 02/18/2016   SpO2 100%   BMI 33.79 kg/m   Physical Exam  Constitutional: She is oriented to person, place, and time. She appears well-developed and well-nourished.  HENT:  Head: Normocephalic and atraumatic.  Right Ear: External ear normal.  Left Ear: External ear normal.  Nose: Nose normal.  Mouth/Throat: Oropharynx is clear and moist.  Eyes: Conjunctivae and EOM are normal. Pupils are equal, round, and reactive to light.  Neck: Normal range of motion. Neck supple.  Cardiovascular: Normal rate, regular rhythm, normal heart sounds and intact distal pulses.   Pulmonary/Chest: Effort normal and breath sounds normal.  Abdominal: Soft. Bowel sounds are normal.  Musculoskeletal: She exhibits edema.  Neurological: She is alert and oriented to person, place, and time.  Skin: Skin is warm.  Psychiatric: She has a normal mood and affect. Her behavior is normal. Judgment and thought content normal.  Nursing note and vitals reviewed.    ED Treatments / Results  Labs (all labs ordered are listed, but only abnormal results are displayed) Labs Reviewed  COMPREHENSIVE METABOLIC PANEL - Abnormal; Notable for the following:       Result Value   Glucose, Bld 110 (*)    Calcium 8.7 (*)    Total Protein 6.4 (*)    ALT 10 (*)    Alkaline Phosphatase 35 (*)    All other components within normal limits  URINALYSIS, ROUTINE W REFLEX MICROSCOPIC - Abnormal; Notable for the following:    APPearance CLOUDY (*)    Protein, ur 30 (*)    Nitrite POSITIVE (*)    Leukocytes, UA LARGE (*)    Bacteria, UA RARE (*)     All other components within normal limits  LIPASE, BLOOD  CBC  PREGNANCY, URINE    EKG  EKG Interpretation None       Radiology No results found.  Procedures Procedures (including critical care time)  Medications Ordered in ED Medications  sulfamethoxazole-trimethoprim (BACTRIM DS,SEPTRA DS) 800-160 MG per tablet 1 tablet (not administered)  promethazine (PHENERGAN) injection 25 mg (25 mg Intramuscular Given 02/24/16 1315)  acetaminophen (TYLENOL) tablet 1,000 mg (1,000 mg Oral Given 02/24/16 1315)     Initial Impression / Assessment and Plan /  ED Course  I have reviewed the triage vital signs and the nursing notes.  Pertinent labs & imaging results that were available during my care of the patient were reviewed by me and considered in my medical decision making (see chart for details).    Pt told that we would be unable to give her narcotics as she is on a care plan.  She was treated with phenergan and tylenol.  She does have an UTI, so she is given a rx for bactrim.  She knows to return if worse and to f/u with pcp.  Final Clinical Impressions(s) / ED Diagnoses   Final diagnoses:  Acute cystitis without hematuria  Non-intractable vomiting with nausea, unspecified vomiting type    New Prescriptions New Prescriptions   PROMETHAZINE (PHENERGAN) 25 MG TABLET    Take 1 tablet (25 mg total) by mouth every 6 (six) hours as needed for nausea or vomiting.   SULFAMETHOXAZOLE-TRIMETHOPRIM (BACTRIM DS,SEPTRA DS) 800-160 MG TABLET    Take 1 tablet by mouth 2 (two) times daily.     Jacalyn Lefevre, MD 02/24/16 [phone]

## 2016-02-24 NOTE — ED Notes (Signed)
Not able to obtain IV site, MD aware.

## 2016-10-20 ENCOUNTER — Telehealth: Payer: Self-pay | Admitting: *Deleted

## 2016-10-20 NOTE — Telephone Encounter (Signed)
Pt called said she was seen at [facility] for migraine last night and was referred to clinic. Pt is aware clinic will need a referral. Please see OV 02/19/15 if Dr Demetrius Charity will see her. Thank you

## 2016-10-21 NOTE — Telephone Encounter (Signed)
Spoke with patient and informed her that Dr Marjory Lies had recommended when he last saw her that she go to pain management, and have PCP refer her to headache clinic for second opinion. Patient stated "that is fine. I saw my doctor yesterday, and he has referred me to [facility]." she verbalized understanding, appreciation of call.

## 2017-01-18 ENCOUNTER — Emergency Department (HOSPITAL_COMMUNITY): Payer: Medicaid Other

## 2017-01-18 ENCOUNTER — Other Ambulatory Visit: Payer: Self-pay

## 2017-01-18 ENCOUNTER — Emergency Department (HOSPITAL_COMMUNITY)
Admission: EM | Admit: 2017-01-18 | Discharge: 2017-01-19 | Disposition: A | Discharge status: Home / Self Care | Payer: Medicaid Other | Attending: Emergency Medicine | Admitting: Emergency Medicine

## 2017-01-18 ENCOUNTER — Encounter (HOSPITAL_COMMUNITY): Payer: Self-pay | Admitting: *Deleted

## 2017-01-18 DIAGNOSIS — J45909 Unspecified asthma, uncomplicated: Secondary | ICD-10-CM | POA: Insufficient documentation

## 2017-01-18 DIAGNOSIS — Z79899 Other long term (current) drug therapy: Secondary | ICD-10-CM | POA: Insufficient documentation

## 2017-01-18 DIAGNOSIS — Z87891 Personal history of nicotine dependence: Secondary | ICD-10-CM | POA: Insufficient documentation

## 2017-01-18 DIAGNOSIS — Z91040 Latex allergy status: Secondary | ICD-10-CM | POA: Diagnosis not present

## 2017-01-18 DIAGNOSIS — I10 Essential (primary) hypertension: Secondary | ICD-10-CM | POA: Diagnosis not present

## 2017-01-18 DIAGNOSIS — R1110 Vomiting, unspecified: Secondary | ICD-10-CM | POA: Diagnosis present

## 2017-01-18 HISTORY — DX: Chronic pain syndrome: G89.4

## 2017-01-18 HISTORY — DX: Chronic migraine without aura, not intractable, without status migrainosus: G43.709

## 2017-01-18 HISTORY — DX: Reserved for concepts with insufficient information to code with codable children: IMO0002

## 2017-01-18 LAB — LIPASE, BLOOD: Lipase: 25 U/L (ref 11–51)

## 2017-01-18 LAB — TROPONIN I: Troponin I: <0.03 ng/mL (ref <0.03)

## 2017-01-18 LAB — CBC
HCT: 39.6 % (ref 36.0–46.0)
Hemoglobin: 13.3 g/dL (ref 12.0–15.0)
MCH: 28.5 pg (ref 26.0–34.0)
MCHC: 33.6 g/dL (ref 30.0–36.0)
MCV: 84.8 fL (ref 78.0–100.0)
Platelets: 252 10*3/uL (ref 150–400)
RBC: 4.67 MIL/uL (ref 3.87–5.11)
RDW: 13.4 % (ref 11.5–15.5)
WBC: 12.5 10*3/uL — ABNORMAL HIGH (ref 4.0–10.5)

## 2017-01-18 LAB — PREGNANCY, URINE: PREG TEST UR: NEGATIVE

## 2017-01-18 LAB — URINALYSIS, ROUTINE W REFLEX MICROSCOPIC
Bilirubin Urine: NEGATIVE
Glucose, UA: NEGATIVE mg/dL
Ketones, ur: NEGATIVE mg/dL
NITRITE: NEGATIVE
Protein, ur: NEGATIVE mg/dL
SPECIFIC GRAVITY, URINE: 1.021 (ref 1.005–1.030)
pH: 5 (ref 5.0–8.0)

## 2017-01-18 LAB — COMPREHENSIVE METABOLIC PANEL
ALBUMIN: 4.3 g/dL (ref 3.5–5.0)
ALK PHOS: 49 U/L (ref 38–126)
ALT: 12 U/L — AB (ref 14–54)
AST: 17 U/L (ref 15–41)
Anion gap: 13 (ref 5–15)
BILIRUBIN TOTAL: 0.5 mg/dL (ref 0.3–1.2)
BUN: 8 mg/dL (ref 6–20)
CALCIUM: 9.5 mg/dL (ref 8.9–10.3)
CO2: 20 mmol/L — ABNORMAL LOW (ref 22–32)
Chloride: 103 mmol/L (ref 101–111)
Creatinine, Ser: 0.7 mg/dL (ref 0.44–1.00)
GFR calc Af Amer: >60 mL/min (ref >60)
GFR calc non Af Amer: >60 mL/min (ref >60)
Glucose, Bld: 103 mg/dL — ABNORMAL HIGH (ref 65–99)
Potassium: 3.9 mmol/L (ref 3.5–5.1)
Sodium: 136 mmol/L (ref 135–145)
TOTAL PROTEIN: 7.9 g/dL (ref 6.5–8.1)

## 2017-01-18 MED ORDER — SODIUM CHLORIDE 0.9 % IV BOLUS (SEPSIS): 1000.0000 mL | Freq: Once | INTRAVENOUS | Status: DC

## 2017-01-18 MED ORDER — DIPHENHYDRAMINE HCL 25 MG PO CAPS
50.0000 mg | ORAL_CAPSULE | Freq: Once | ORAL | Status: AC
  Administered 2017-01-18: 50 mg via ORAL
  Filled 2017-01-18: qty 2

## 2017-01-18 MED ORDER — PROMETHAZINE HCL 25 MG/ML IJ SOLN: 25.0000 mg | Freq: Once | INTRAMUSCULAR | Status: DC

## 2017-01-18 MED ORDER — DIPHENHYDRAMINE HCL 50 MG/ML IJ SOLN: 25.0000 mg | Freq: Once | INTRAMUSCULAR | Status: DC

## 2017-01-18 MED ORDER — PROMETHAZINE HCL 25 MG/ML IJ SOLN
12.5000 mg | Freq: Once | INTRAMUSCULAR | Status: AC
  Administered 2017-01-18: 12.5 mg via INTRAMUSCULAR
  Filled 2017-01-18: qty 1

## 2017-01-18 MED ORDER — PREDNISONE 50 MG PO TABS
60.0000 mg | ORAL_TABLET | Freq: Once | ORAL | Status: AC
  Administered 2017-01-18: 60 mg via ORAL
  Filled 2017-01-18: qty 1

## 2017-01-18 MED ORDER — HYDROMORPHONE HCL 1 MG/ML IJ SOLN: 1.0000 mg | Freq: Once | INTRAMUSCULAR | Status: DC

## 2017-01-18 MED ORDER — METHYLPREDNISOLONE SODIUM SUCC 125 MG IJ SOLR: 125.0000 mg | Freq: Once | INTRAMUSCULAR | Status: DC

## 2017-01-18 MED ORDER — HYDROMORPHONE HCL 1 MG/ML IJ SOLN
1.0000 mg | Freq: Once | INTRAMUSCULAR | Status: AC
  Administered 2017-01-18: 1 mg via INTRAMUSCULAR
  Filled 2017-01-18: qty 1

## 2017-01-18 NOTE — ED Triage Notes (Signed)
Pt c/o nausea, vomiting, headaches that started Sunday. Pt saw her PCP yesterday and was diagnosed with shingles and given prescription. Pt denies any relief of pain.   Pt also c/o mid chest pain that started yesterday with SOB.

## 2017-01-19 MED ORDER — HYDROXYZINE HCL 25 MG PO TABS
25.0000 mg | ORAL_TABLET | Freq: Once | ORAL | Status: AC
  Administered 2017-01-19: 25 mg via ORAL
  Filled 2017-01-19: qty 1

## 2017-01-19 MED ORDER — HYDROMORPHONE HCL 2 MG/ML IJ SOLN
2.0000 mg | Freq: Once | INTRAMUSCULAR | Status: AC
  Administered 2017-01-19: 2 mg via INTRAMUSCULAR
  Filled 2017-01-19: qty 1

## 2017-01-19 NOTE — ED Provider Notes (Signed)
[facility] EMERGENCY DEPARTMENT Provider Note   CSN: [id number] Arrival date & time: 01/18/17  1605     History   Chief Complaint Chief Complaint  Patient presents with  . Emesis  . Herpes Zoster    HPI Tanya Krause is a 32 y.o. female.   Emesis   This is a new problem. The current episode started 1 to 2 hours ago. The problem occurs 2 to 4 times per day. The problem has not changed since onset.The emesis has an appearance of stomach contents.    Past Medical History:  Diagnosis Date  . Asthma   . Chronic abdominal pain   . Chronic migraine   . Chronic pain syndrome   . Fibromyalgia   . 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 Hypertension   . Migraine   . Pelvic inflammatory disease (PID)     Patient Active Problem List   Diagnosis Date Noted  . Colitis 08/12/2015  . Oral thrush 11/18/2014  . B12 deficiency 11/17/2014  . Chronic pain syndrome 11/17/2014  . Numbness 11/17/2014  . Cephalalgia 11/17/2014  . Intractable chronic migraine without aura and without status migrainosus 11/17/2014  . Chronic migraine 10/11/2014  . Fibromyalgia 10/11/2014  . Lower extremity weakness 10/11/2014  . Vitamin B12 deficiency 10/11/2014  . Anxiety state 10/11/2014  . Nausea & vomiting 10/08/2014  . Headache, tension type, chronic 09/07/2012  . Abdominal pain 06/15/2012    Past Surgical History:  Procedure Laterality Date  . kidney infections    . TUBAL LIGATION    . WISDOM TOOTH EXTRACTION      OB History    Gravida Para Term Preterm AB Living   4 4 1 3   4    SAB TAB Ectopic Multiple Live Births           4       Home Medications    Prior to Admission medications   Medication Sig Start Date End Date Taking? Authorizing Provider  albuterol (PROVENTIL HFA;VENTOLIN HFA) 108 (90 BASE) MCG/ACT inhaler Inhale 2 puffs into the lungs every 6 (six) hours as needed for wheezing or shortness of breath. Reported on 01/13/2015 01/14/12   01/16/12, MD  B Complex  Vitamins (VITAMIN B COMPLEX PO) Take 1 tablet by mouth daily.    [provider]  Cholecalciferol (VITAMIN D PO) Take 1 tablet by mouth daily.    [provider]  DULoxetine (CYMBALTA) 60 MG capsule Take 60 mg by mouth daily.    [provider]  folic acid (FOLVITE) 1 MG tablet Take 1 tablet (1 mg total) by mouth daily. 10/17/14   10/19/14, MD  meclizine (ANTIVERT) 25 MG tablet Take 1 tablet (25 mg total) by mouth 3 (three) times daily as needed for dizziness. 08/26/15   10/26/15, MD  nicotine (NICODERM CQ - DOSED IN MG/24 HOURS) 21 mg/24hr patch Place 21 mg onto the skin daily.    [provider]  oxyCODONE (OXY IR/ROXICODONE) 5 MG immediate release tablet Take 5 mg by mouth 3 (three) times daily as needed. 06/04/15   [provider]  promethazine (PHENERGAN) 25 MG tablet Take 1 tablet (25 mg total) by mouth every 6 (six) hours as needed for nausea or vomiting. 02/24/16   04/23/16, MD  propranolol (INDERAL) 40 MG tablet Take 40 mg by mouth 2 (two) times daily.    [provider]    Family History Family History  Problem Relation  Age of Onset  . Hypertension Mother   . Diabetes Mother   . Heart failure Father   . Colon cancer Paternal Grandfather     Social History Social History   Tobacco Use  . Smoking status: Former Smoker    Packs/day: 0.50    Types: Cigarettes    Last attempt to quit: 03/18/2015    Years since quitting: 1.8  . Smokeless tobacco: Never Used  . Tobacco comment: Occasional smoker  Substance Use Topics  . Alcohol use: Yes    Comment: occasionally  . Drug use: Yes    Types: Marijuana    Comment: last used Sat.      Allergies   Metoclopramide; Tomato; Aspirin; Doxycycline; Hydromorphone hcl; Ibuprofen; Ondansetron; Other; Shellfish allergy; Toradol [ketorolac tromethamine]; Latex; and Tramadol   Review of Systems Review of Systems  Gastrointestinal: Positive for vomiting.  All other systems  reviewed and are negative.    Physical Exam Updated Vital Signs BP (!) 149/100 (BP Location: Right Arm)   Pulse 71   Temp 98.6 F (37 C) (Oral)   Resp 14   Ht 5' (1.524 m)   Wt 78.9 kg (174 lb)   LMP 01/08/2017   SpO2 98%   BMI 33.98 kg/m   Physical Exam  Constitutional: She appears well-developed and well-nourished.  HENT:  Head: Normocephalic and atraumatic.  Eyes: Conjunctivae and EOM are normal.  Neck: Normal range of motion.  Cardiovascular: Normal rate and regular rhythm.  Pulmonary/Chest: No stridor. No respiratory distress.  Abdominal: Soft. She exhibits no distension.  Neurological: She is alert.  Skin: Skin is warm and dry. Rash (vesicals grouped on left side of mid back) noted.  Nursing note and vitals reviewed.    ED Treatments / Results  Labs (all labs ordered are listed, but only abnormal results are displayed) Labs Reviewed  COMPREHENSIVE METABOLIC PANEL - Abnormal; Notable for the following components:      Result Value   CO2 20 (*)    Glucose, Bld 103 (*)    ALT 12 (*)    All other components within normal limits  CBC - Abnormal; Notable for the following components:   WBC 12.5 (*)    All other components within normal limits  URINALYSIS, ROUTINE W REFLEX MICROSCOPIC - Abnormal; Notable for the following components:   APPearance HAZY (*)    Hgb urine dipstick SMALL (*)    Leukocytes, UA LARGE (*)    Bacteria, UA RARE (*)    Squamous Epithelial / LPF 6-30 (*)    All other components within normal limits  LIPASE, BLOOD  PREGNANCY, URINE  TROPONIN I    EKG  EKG Interpretation  Date/Time:  Wednesday January 18 2017 16:49:30 EST Ventricular Rate:  76 PR Interval:  126 QRS Duration: 84 QT Interval:  380 QTC Calculation: 427 R Axis:   64 Text Interpretation:  Normal sinus rhythm with sinus arrhythmia Normal ECG since last tracing no significant change Confirmed by Mancel Bale [phone]) on 01/18/2017 5:55:10 PM       Radiology Dg Chest  2 View  Result Date: 01/18/2017 CLINICAL DATA:  Chest pain and shortness of breath EXAM: CHEST  2 VIEW COMPARISON:  April 23, 2015 FINDINGS: Lungs are clear. Heart size and pulmonary vascularity are normal. No adenopathy. No pneumothorax. No bone lesions. IMPRESSION: No edema or consolidation. Electronically Signed   By: Bretta Bang III M.D.   On: 01/18/2017 17:56    Procedures Procedures (including critical care time)  Medications Ordered in ED Medications  diphenhydrAMINE (BENADRYL) capsule 50 mg (50 mg Oral Given 01/18/17 2325)  promethazine (PHENERGAN) injection 12.5 mg (12.5 mg Intramuscular Given 01/18/17 2326)  HYDROmorphone (DILAUDID) injection 1 mg (1 mg Intramuscular Given 01/18/17 2326)  predniSONE (DELTASONE) tablet 60 mg (60 mg Oral Given 01/18/17 2325)  HYDROmorphone (DILAUDID) injection 2 mg (2 mg Intramuscular Given 01/19/17 0042)  hydrOXYzine (ATARAX/VISTARIL) tablet 25 mg (25 mg Oral Given 01/19/17 0042)     Initial Impression / Assessment and Plan / ED Course  I have reviewed the triage vital signs and the nursing notes.  Pertinent labs & imaging results that were available during my care of the patient were reviewed by me and considered in my medical decision making (see chart for details).   Zoster. Pain somewhat improved in ED but I suspect it will not be totally controlled. More importantly she is tolerating PO fluids/solids. Plan for dc w/ phenergan and pcp follow up for further narcotic Rx as they see fit.  Final Clinical Impressions(s) / ED Diagnoses   Final diagnoses:  Vomiting, intractability of vomiting not specified, presence of nausea not specified, unspecified vomiting type     Marily Memos, MD 01/19/17 [id number]

## 2017-05-25 ENCOUNTER — Emergency Department (HOSPITAL_COMMUNITY)
Admission: EM | Admit: 2017-05-25 | Discharge: 2017-05-25 | Disposition: A | Discharge status: Home / Self Care | Payer: Medicaid Other | Attending: Emergency Medicine | Admitting: Emergency Medicine

## 2017-05-25 ENCOUNTER — Emergency Department (HOSPITAL_COMMUNITY): Payer: Medicaid Other

## 2017-05-25 ENCOUNTER — Encounter (HOSPITAL_COMMUNITY): Payer: Self-pay | Admitting: *Deleted

## 2017-05-25 ENCOUNTER — Other Ambulatory Visit: Payer: Self-pay

## 2017-05-25 DIAGNOSIS — Z91040 Latex allergy status: Secondary | ICD-10-CM | POA: Insufficient documentation

## 2017-05-25 DIAGNOSIS — R531 Weakness: Secondary | ICD-10-CM | POA: Diagnosis present

## 2017-05-25 DIAGNOSIS — I10 Essential (primary) hypertension: Secondary | ICD-10-CM | POA: Diagnosis not present

## 2017-05-25 DIAGNOSIS — Z79899 Other long term (current) drug therapy: Secondary | ICD-10-CM | POA: Diagnosis not present

## 2017-05-25 DIAGNOSIS — F17210 Nicotine dependence, cigarettes, uncomplicated: Secondary | ICD-10-CM | POA: Diagnosis not present

## 2017-05-25 DIAGNOSIS — J40 Bronchitis, not specified as acute or chronic: Secondary | ICD-10-CM

## 2017-05-25 LAB — COMPREHENSIVE METABOLIC PANEL
ALT: 15 U/L (ref 14–54)
AST: 20 U/L (ref 15–41)
Albumin: 3.7 g/dL (ref 3.5–5.0)
Alkaline Phosphatase: 44 U/L (ref 38–126)
Anion gap: 7 (ref 5–15)
BILIRUBIN TOTAL: 0.6 mg/dL (ref 0.3–1.2)
CALCIUM: 9 mg/dL (ref 8.9–10.3)
CO2: 25 mmol/L (ref 22–32)
CREATININE: 0.62 mg/dL (ref 0.44–1.00)
Chloride: 105 mmol/L (ref 101–111)
GFR calc non Af Amer: >60 mL/min (ref >60)
Glucose, Bld: 112 mg/dL — ABNORMAL HIGH (ref 65–99)
Potassium: 3.4 mmol/L — ABNORMAL LOW (ref 3.5–5.1)
Sodium: 137 mmol/L (ref 135–145)
TOTAL PROTEIN: 6.9 g/dL (ref 6.5–8.1)

## 2017-05-25 LAB — CBC WITH DIFFERENTIAL/PLATELET
BASOS ABS: 0 10*3/uL (ref 0.0–0.1)
Basophils Relative: 0 %
Eosinophils Absolute: 0.2 10*3/uL (ref 0.0–0.7)
Eosinophils Relative: 2 %
HEMATOCRIT: 34.3 % — AB (ref 36.0–46.0)
HEMOGLOBIN: 11.8 g/dL — AB (ref 12.0–15.0)
LYMPHS PCT: 22 %
Lymphs Abs: 2.8 10*3/uL (ref 0.7–4.0)
MCH: 28.8 pg (ref 26.0–34.0)
MCHC: 34.4 g/dL (ref 30.0–36.0)
MCV: 83.7 fL (ref 78.0–100.0)
Monocytes Absolute: 0.9 10*3/uL (ref 0.1–1.0)
Monocytes Relative: 7 %
NEUTROS ABS: 8.9 10*3/uL — AB (ref 1.7–7.7)
Neutrophils Relative %: 69 %
Platelets: 272 10*3/uL (ref 150–400)
RBC: 4.1 MIL/uL (ref 3.87–5.11)
RDW: 14.2 % (ref 11.5–15.5)
WBC: 12.9 10*3/uL — AB (ref 4.0–10.5)

## 2017-05-25 MED ORDER — DIPHENHYDRAMINE HCL 25 MG PO CAPS
25.0000 mg | ORAL_CAPSULE | Freq: Once | ORAL | Status: AC
  Administered 2017-05-25: 25 mg via ORAL
  Filled 2017-05-25: qty 1

## 2017-05-25 MED ORDER — HYDROCODONE-ACETAMINOPHEN 5-325 MG PO TABS
1.0000 | ORAL_TABLET | Freq: Four times a day (QID) | ORAL | 0 refills | Status: DC | PRN
Start: 2017-05-25 — End: 2017-07-28

## 2017-05-25 MED ORDER — PROMETHAZINE HCL 25 MG/ML IJ SOLN
12.5000 mg | Freq: Once | INTRAMUSCULAR | Status: AC
  Administered 2017-05-25: 12.5 mg via INTRAVENOUS
  Filled 2017-05-25: qty 1

## 2017-05-25 MED ORDER — MORPHINE SULFATE (PF) 4 MG/ML IV SOLN
4.0000 mg | Freq: Once | INTRAVENOUS | Status: AC
  Administered 2017-05-25: 4 mg via INTRAVENOUS
  Filled 2017-05-25: qty 1

## 2017-05-25 MED ORDER — HYDROMORPHONE HCL 1 MG/ML IJ SOLN
1.0000 mg | Freq: Once | INTRAMUSCULAR | Status: AC
  Administered 2017-05-25: 1 mg via INTRAVENOUS
  Filled 2017-05-25: qty 1

## 2017-05-25 MED ORDER — PROMETHAZINE HCL 25 MG RE SUPP: 25.0000 mg | Freq: Four times a day (QID) | RECTAL | 0 refills | Status: DC | PRN

## 2017-05-25 MED ORDER — SODIUM CHLORIDE 0.9 % IV BOLUS
1000.0000 mL | Freq: Once | INTRAVENOUS | Status: AC
  Administered 2017-05-25: 1000 mL via INTRAVENOUS

## 2017-05-25 MED ORDER — ALBUTEROL SULFATE HFA 108 (90 BASE) MCG/ACT IN AERS
2.0000 | INHALATION_SPRAY | RESPIRATORY_TRACT | Status: DC
  Administered 2017-05-25: 2 via RESPIRATORY_TRACT
  Filled 2017-05-25: qty 6.7

## 2017-05-25 MED ORDER — ALBUTEROL SULFATE (2.5 MG/3ML) 0.083% IN NEBU
2.5000 mg | INHALATION_SOLUTION | Freq: Once | RESPIRATORY_TRACT | Status: AC
  Administered 2017-05-25: 2.5 mg via RESPIRATORY_TRACT
  Filled 2017-05-25: qty 3

## 2017-05-25 MED ORDER — IPRATROPIUM-ALBUTEROL 0.5-2.5 (3) MG/3ML IN SOLN
3.0000 mL | Freq: Once | RESPIRATORY_TRACT | Status: AC
  Administered 2017-05-25: 3 mL via RESPIRATORY_TRACT
  Filled 2017-05-25: qty 3

## 2017-05-25 MED ORDER — DIPHENHYDRAMINE HCL 50 MG/ML IJ SOLN
25.0000 mg | Freq: Once | INTRAMUSCULAR | Status: AC
  Administered 2017-05-25: 25 mg via INTRAVENOUS
  Filled 2017-05-25: qty 1

## 2017-05-25 MED ORDER — AZITHROMYCIN 250 MG PO TABS: ORAL_TABLET | ORAL | 0 refills | Status: DC

## 2017-05-25 NOTE — ED Triage Notes (Addendum)
Pt c/o lightheadedness, feeling of "passing out", generalized weakness, nausea, vomiting, diarrhea, migraine, thick green sputum, fever, SOB, wheezing that started Sunday. Pt reports fever this morning of 102.3 at 0600 and pt took Tylenol at that time. Pt has used Zofran at home with no relief. Denies abdominal pain.

## 2017-05-25 NOTE — ED Provider Notes (Signed)
[facility] EMERGENCY DEPARTMENT Provider Note   CSN: [id number] Arrival date & time: 05/25/17  0715     History   Chief Complaint Chief Complaint  Patient presents with  . Weakness    HPI Tanya Krause is a 32 y.o. female.  Patient complains of cough congestion fever chills aches.  The history is provided by the patient. No language interpreter was used.  Illness  This is a new problem. The current episode started 12 to 24 hours ago. The problem occurs constantly. The problem has not changed since onset.Pertinent negatives include no chest pain, no abdominal pain and no headaches. Nothing aggravates the symptoms. Nothing relieves the symptoms. She has tried nothing for the symptoms. The treatment provided moderate relief.    Past Medical History:  Diagnosis Date  . Asthma   . Chronic abdominal pain   . Chronic migraine   . Chronic pain syndrome   . Fibromyalgia   . 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 Hypertension   . Migraine   . Pelvic inflammatory disease (PID)     Patient Active Problem List   Diagnosis Date Noted  . Colitis 08/12/2015  . Oral thrush 11/18/2014  . B12 deficiency 11/17/2014  . Chronic pain syndrome 11/17/2014  . Numbness 11/17/2014  . Cephalalgia 11/17/2014  . Intractable chronic migraine without aura and without status migrainosus 11/17/2014  . Chronic migraine 10/11/2014  . Fibromyalgia 10/11/2014  . Lower extremity weakness 10/11/2014  . Vitamin B12 deficiency 10/11/2014  . Anxiety state 10/11/2014  . Nausea & vomiting 10/08/2014  . Headache, tension type, chronic 09/07/2012  . Abdominal pain 06/15/2012    Past Surgical History:  Procedure Laterality Date  . kidney infections    . TUBAL LIGATION    . WISDOM TOOTH EXTRACTION       OB History    Gravida  4   Para  4   Term  1   Preterm  3   AB      Living  4     SAB      TAB      Ectopic      Multiple      Live Births  4            Home Medications     Prior to Admission medications   Medication Sig Start Date End Date Taking? Authorizing Provider  albuterol (PROVENTIL HFA;VENTOLIN HFA) 108 (90 BASE) MCG/ACT inhaler Inhale 2 puffs into the lungs every 6 (six) hours as needed for wheezing or shortness of breath. Reported on 01/13/2015 01/14/12   Consuello Masse, MD  azithromycin (ZITHROMAX Z-PAK) 250 MG tablet 2 po day one, then 1 daily x 4 days 05/25/17   Bethann Berkshire, MD  B Complex Vitamins (VITAMIN B COMPLEX PO) Take 1 tablet by mouth daily.    [provider]  Cholecalciferol (VITAMIN D PO) Take 1 tablet by mouth daily.    [provider]  DULoxetine (CYMBALTA) 60 MG capsule Take 60 mg by mouth daily.    [provider]  folic acid (FOLVITE) 1 MG tablet Take 1 tablet (1 mg total) by mouth daily. 10/17/14   Jerald Kief, MD  HYDROcodone-acetaminophen (NORCO/VICODIN) 5-325 MG tablet Take 1 tablet by mouth every 6 (six) hours as needed for moderate pain. 05/25/17   Bethann Berkshire, MD  meclizine (ANTIVERT) 25 MG tablet Take 1 tablet (25 mg total) by mouth 3 (three) times daily as needed for dizziness. 08/26/15  Rolland Porter, MD  nicotine (NICODERM CQ - DOSED IN MG/24 HOURS) 21 mg/24hr patch Place 21 mg onto the skin daily.    [provider]  oxyCODONE (OXY IR/ROXICODONE) 5 MG immediate release tablet Take 5 mg by mouth 3 (three) times daily as needed. 06/04/15   [provider]  promethazine (PHENERGAN) 25 MG suppository Place 1 suppository (25 mg total) rectally every 6 (six) hours as needed for nausea or vomiting. 05/25/17   Bethann Berkshire, MD  propranolol (INDERAL) 40 MG tablet Take 40 mg by mouth 2 (two) times daily.    [provider]  prochlorperazine (COMPAZINE) 10 MG tablet Take 1 tablet (10 mg total) by mouth 2 (two) times daily as needed for nausea or vomiting (Nausea ). 06/22/15 07/27/15  Eber Hong, MD    Family History Family History  Problem Relation Age of Onset  . Hypertension  Mother   . Diabetes Mother   . Heart failure Father   . Colon cancer Paternal Grandfather     Social History Social History   Tobacco Use  . Smoking status: Current Some Day Smoker    Packs/day: 0.50    Types: Cigarettes    Last attempt to quit: 03/18/2015    Years since quitting: 2.1  . Smokeless tobacco: Never Used  Substance Use Topics  . Alcohol use: Not Currently  . Drug use: Yes    Types: Marijuana    Comment: lase used April 2019     Allergies   Metoclopramide; Tomato; Aspirin; Doxycycline; Hydromorphone hcl; Ibuprofen; Ondansetron; Other; Shellfish allergy; Toradol [ketorolac tromethamine]; Latex; and Tramadol   Review of Systems Review of Systems  Constitutional: Negative for appetite change and fatigue.  HENT: Negative for congestion, ear discharge and sinus pressure.   Eyes: Negative for discharge.  Respiratory: Positive for cough and wheezing.   Cardiovascular: Negative for chest pain.  Gastrointestinal: Negative for abdominal pain and diarrhea.  Genitourinary: Negative for frequency and hematuria.  Musculoskeletal: Negative for back pain.  Skin: Negative for rash.  Neurological: Negative for seizures and headaches.  Psychiatric/Behavioral: Negative for hallucinations.     Physical Exam Updated Vital Signs BP 117/83   Pulse (!) 102   Temp 98.5 F (36.9 C) (Oral)   Resp 15   Ht 5' (1.524 m)   Wt 79.4 kg (175 lb)   LMP 05/12/2017   SpO2 100%   BMI 34.18 kg/m   Physical Exam  Constitutional: She is oriented to person, place, and time. She appears well-developed.  HENT:  Head: Normocephalic.  Eyes: Conjunctivae and EOM are normal. No scleral icterus.  Neck: Neck supple. No thyromegaly present.  Cardiovascular: Normal rate and regular rhythm. Exam reveals no gallop and no friction rub.  No murmur heard. Pulmonary/Chest: No stridor. She has wheezes. She has no rales. She exhibits no tenderness.  Abdominal: She exhibits no distension. There is no  tenderness. There is no rebound.  Musculoskeletal: Normal range of motion. She exhibits no edema.  Lymphadenopathy:    She has no cervical adenopathy.  Neurological: She is oriented to person, place, and time. She exhibits normal muscle tone. Coordination normal.  Skin: No rash noted. No erythema.  Psychiatric: She has a normal mood and affect. Her behavior is normal.     ED Treatments / Results  Labs (all labs ordered are listed, but only abnormal results are displayed) Labs Reviewed  CBC WITH DIFFERENTIAL/PLATELET - Abnormal; Notable for the following components:      Result Value  WBC 12.9 (*)    Hemoglobin 11.8 (*)    HCT 34.3 (*)    Neutro Abs 8.9 (*)    All other components within normal limits  COMPREHENSIVE METABOLIC PANEL - Abnormal; Notable for the following components:   Potassium 3.4 (*)    Glucose, Bld 112 (*)    BUN <5 (*)    All other components within normal limits    EKG None  Radiology Dg Chest 2 View  Result Date: 05/25/2017 CLINICAL DATA:  Productive cough, congestion, wheezing, vomiting, fever and dizziness since Sunday morning. She also said that she has a migraine. Hx of asthma and bronchitis. EXAM: CHEST - 2 VIEW COMPARISON:  01/18/2017 FINDINGS: Lungs are clear. Heart size and mediastinal contours are within normal limits. No effusion. Visualized bones unremarkable. IMPRESSION: No acute cardiopulmonary disease. Electronically Signed   By: D  Hassell M.D.   On: 05/25/2017 09:33    Procedures Procedures (including critical care time)  Medications Ordered in ED Medications  diphenhydrAMINE (BENADRYL) injection 25 mg (has no administration in time range)  HYDROmorphone (DILAUDID) injection 1 mg (has no administration in time range)  sodium chloride 0.9 % bolus 1,000 mL (0 mLs Intravenous Stopped 05/25/17 0900)  promethazine (PHENERGAN) injection 12.5 mg (12.5 mg Intravenous Given 05/25/17 0759)  morphine 4 MG/ML injection 4 mg (4 mg Intravenous Given  05/25/17 0759)  ipratropium-albuterol (DUONEB) 0.5-2.5 (3) MG/3ML nebulizer solution 3 mL (3 mLs Nebulization Given 05/25/17 0844)  albuterol (PROVENTIL) (2.5 MG/3ML) 0.083% nebulizer solution 2.5 mg (2.5 mg Nebulization Given 05/25/17 0844)  diphenhydrAMINE (BENADRYL) capsule 25 mg (25 mg Oral Given 05/25/17 0809)     Initial Impression / Assessment and Plan / ED Course  I have reviewed the triage vital signs and the nursing notes.  Pertinent labs & imaging results that were available during my care of the patient were reviewed by me and considered in my medical decision making (see chart for details).    Patient with cough congestion wheezing.  Labs show mildly elevated white count.  Patient improved with neb treatment.  Suspect bronchitis with bronchospasm she will be discharged home with a Z-Pak along with albuterol inhaler some Phenergan Vicodin and will follow-up with her PCP   Final Clinical Impressions(s) / ED Diagnoses   Final diagnoses:  Bronchitis    ED Discharge Orders        Ordered    azithromycin (ZITHROMAX Z-PAK) 250 MG tablet     05/25/17 1005    promethazine (PHENERGAN) 25 MG suppository  Every 6 hours PRN     05/25/17 1005    HYDROcodone-acetaminophen (NORCO/VICODIN) 5-325 MG tablet  Every 6 hours PRN     05 /09/19 1005       Bethann Berkshire, MD 05/25/17 1016

## 2017-05-25 NOTE — Discharge Instructions (Signed)
Drink plenty of fluids and follow-up next week if not improving °

## 2017-05-25 NOTE — ED Notes (Signed)
Pt has dry heaves, also wanting inhaler, states she is out, MD aware

## 2017-07-28 ENCOUNTER — Encounter (HOSPITAL_COMMUNITY): Payer: Self-pay

## 2017-07-28 ENCOUNTER — Emergency Department (HOSPITAL_COMMUNITY)
Admission: EM | Admit: 2017-07-28 | Discharge: 2017-07-28 | Disposition: A | Discharge status: Home / Self Care | Payer: Medicaid Other | Attending: Emergency Medicine | Admitting: Emergency Medicine

## 2017-07-28 ENCOUNTER — Other Ambulatory Visit: Payer: Self-pay

## 2017-07-28 DIAGNOSIS — Z79899 Other long term (current) drug therapy: Secondary | ICD-10-CM | POA: Diagnosis not present

## 2017-07-28 DIAGNOSIS — R112 Nausea with vomiting, unspecified: Secondary | ICD-10-CM | POA: Diagnosis not present

## 2017-07-28 DIAGNOSIS — I10 Essential (primary) hypertension: Secondary | ICD-10-CM | POA: Insufficient documentation

## 2017-07-28 DIAGNOSIS — G894 Chronic pain syndrome: Secondary | ICD-10-CM | POA: Insufficient documentation

## 2017-07-28 DIAGNOSIS — J45909 Unspecified asthma, uncomplicated: Secondary | ICD-10-CM | POA: Diagnosis not present

## 2017-07-28 DIAGNOSIS — R197 Diarrhea, unspecified: Secondary | ICD-10-CM | POA: Diagnosis not present

## 2017-07-28 DIAGNOSIS — M797 Fibromyalgia: Secondary | ICD-10-CM | POA: Insufficient documentation

## 2017-07-28 DIAGNOSIS — Z91040 Latex allergy status: Secondary | ICD-10-CM | POA: Diagnosis not present

## 2017-07-28 DIAGNOSIS — F17210 Nicotine dependence, cigarettes, uncomplicated: Secondary | ICD-10-CM | POA: Insufficient documentation

## 2017-07-28 LAB — URINALYSIS, ROUTINE W REFLEX MICROSCOPIC
BACTERIA UA: NONE SEEN
Bilirubin Urine: NEGATIVE
GLUCOSE, UA: NEGATIVE mg/dL
Ketones, ur: 5 mg/dL — AB
Leukocytes, UA: NEGATIVE
Nitrite: NEGATIVE
PROTEIN: NEGATIVE mg/dL
SPECIFIC GRAVITY, URINE: 1.004 — AB (ref 1.005–1.030)
pH: 7 (ref 5.0–8.0)

## 2017-07-28 LAB — CBC WITH DIFFERENTIAL/PLATELET
BASOS ABS: 0 10*3/uL (ref 0.0–0.1)
BASOS PCT: 0 %
Eosinophils Absolute: 0.2 10*3/uL (ref 0.0–0.7)
Eosinophils Relative: 1 %
HEMATOCRIT: 34.6 % — AB (ref 36.0–46.0)
Hemoglobin: 12.2 g/dL (ref 12.0–15.0)
Lymphocytes Relative: 14 %
Lymphs Abs: 2.5 10*3/uL (ref 0.7–4.0)
MCH: 30.1 pg (ref 26.0–34.0)
MCHC: 35.3 g/dL (ref 30.0–36.0)
MCV: 85.4 fL (ref 78.0–100.0)
MONOS PCT: 5 %
Monocytes Absolute: 0.9 10*3/uL (ref 0.1–1.0)
NEUTROS ABS: 13.6 10*3/uL — AB (ref 1.7–7.7)
Neutrophils Relative %: 80 %
Platelets: 227 10*3/uL (ref 150–400)
RBC: 4.05 MIL/uL (ref 3.87–5.11)
RDW: 13.4 % (ref 11.5–15.5)
WBC: 17.2 10*3/uL — ABNORMAL HIGH (ref 4.0–10.5)

## 2017-07-28 LAB — COMPREHENSIVE METABOLIC PANEL
ALBUMIN: 3.5 g/dL (ref 3.5–5.0)
ALK PHOS: 48 U/L (ref 38–126)
ALT: 9 U/L (ref 0–44)
AST: 14 U/L — ABNORMAL LOW (ref 15–41)
Anion gap: 7 (ref 5–15)
BILIRUBIN TOTAL: 0.5 mg/dL (ref 0.3–1.2)
BUN: 5 mg/dL — ABNORMAL LOW (ref 6–20)
CALCIUM: 8 mg/dL — AB (ref 8.9–10.3)
CO2: 22 mmol/L (ref 22–32)
Chloride: 110 mmol/L (ref 98–111)
Creatinine, Ser: 0.58 mg/dL (ref 0.44–1.00)
GFR calc Af Amer: >60 mL/min (ref >60)
GFR calc non Af Amer: >60 mL/min (ref >60)
GLUCOSE: 89 mg/dL (ref 70–99)
Potassium: 3.3 mmol/L — ABNORMAL LOW (ref 3.5–5.1)
SODIUM: 139 mmol/L (ref 135–145)
TOTAL PROTEIN: 6.8 g/dL (ref 6.5–8.1)

## 2017-07-28 LAB — LIPASE, BLOOD: Lipase: 24 U/L (ref 11–51)

## 2017-07-28 MED ORDER — PROMETHAZINE HCL 25 MG/ML IJ SOLN
25.0000 mg | Freq: Once | INTRAMUSCULAR | Status: AC
  Administered 2017-07-28: 25 mg via INTRAVENOUS
  Filled 2017-07-28: qty 1

## 2017-07-28 MED ORDER — SODIUM CHLORIDE 0.9 % IV BOLUS
1000.0000 mL | Freq: Once | INTRAVENOUS | Status: AC
  Administered 2017-07-28: 1000 mL via INTRAVENOUS

## 2017-07-28 MED ORDER — PROCHLORPERAZINE EDISYLATE 10 MG/2ML IJ SOLN
10.0000 mg | Freq: Once | INTRAMUSCULAR | Status: AC
  Administered 2017-07-28: 10 mg via INTRAVENOUS
  Filled 2017-07-28: qty 2

## 2017-07-28 MED ORDER — PROMETHAZINE HCL 25 MG RE SUPP: 25.0000 mg | Freq: Four times a day (QID) | RECTAL | 0 refills | Status: DC | PRN

## 2017-07-28 NOTE — ED Triage Notes (Signed)
Pt reports vomiting, diarrhea, sore throat, chest pain, sob and generalized weakness x 2 days.  Denies cough.

## 2017-07-28 NOTE — Discharge Instructions (Signed)
Please take the Phenergan suppository as needed for ongoing nausea  ER for severe or worsening symptoms  Please obtain all of your results from medical records or have your doctors office obtain the results - share them with your doctor - you should be seen at your doctors office in the next 2 days. Call today to arrange your follow up. Take the medications as prescribed. Please review all of the medicines and only take them if you do not have an allergy to them. Please be aware that if you are taking birth control pills, taking other prescriptions, ESPECIALLY ANTIBIOTICS may make the birth control ineffective - if this is the case, either do not engage in sexual activity or use alternative methods of birth control such as condoms until you have finished the medicine and your family doctor says it is OK to restart them. If you are on a blood thinner such as COUMADIN, be aware that any other medicine that you take may cause the coumadin to either work too much, or not enough - you should have your coumadin level rechecked in next 7 days if this is the case.  ?  It is also a possibility that you have an allergic reaction to any of the medicines that you have been prescribed - Everybody reacts differently to medications and while MOST people have no trouble with most medicines, you may have a reaction such as nausea, vomiting, rash, swelling, shortness of breath. If this is the case, please stop taking the medicine immediately and contact your physician.  ?  You should return to the ER if you develop severe or worsening symptoms.

## 2017-07-28 NOTE — ED Notes (Addendum)
DrM in to assess Pt continues to request pain meds for HA Dr Judie Petit explained to pt that fluids continue and she should be ready to be discharged soon  Pt fluids continue thru the IV to thumb

## 2017-07-28 NOTE — ED Provider Notes (Signed)
[facility] EMERGENCY DEPARTMENT Provider Note   CSN: [id number] Arrival date & time: 07/28/17  0700     History   Chief Complaint Chief Complaint  Patient presents with  . Emesis    HPI Tanya Krause is a 32 y.o. female.  HPI  The patient is a 32 year old female, she is well-known to the emergency department for her chronic pain, chronic abdominal pain, chronic headaches.  She presents with 2 days of intermittent vomiting, at least 4 episodes per day, associated loose and watery stools, abdominal discomfort and a feeling of fevers up as high as 100.4.  She has not had any evaluation for this specific illness prior to her visit today.  Review of the medical record shows that she has had multiple CT scans and evaluations for chronic abdominal pain in the past.  She has taken no medications prior to arrival other than Phenergan which she states did not help.  Past Medical History:  Diagnosis Date  . Asthma   . Chronic abdominal pain   . Chronic migraine   . Chronic pain syndrome   . Fibromyalgia   . 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 Hypertension   . Migraine   . Pelvic inflammatory disease (PID)     Patient Active Problem List   Diagnosis Date Noted  . Colitis 08/12/2015  . Oral thrush 11/18/2014  . B12 deficiency 11/17/2014  . Chronic pain syndrome 11/17/2014  . Numbness 11/17/2014  . Cephalalgia 11/17/2014  . Intractable chronic migraine without aura and without status migrainosus 11/17/2014  . Chronic migraine 10/11/2014  . Fibromyalgia 10/11/2014  . Lower extremity weakness 10/11/2014  . Vitamin B12 deficiency 10/11/2014  . Anxiety state 10/11/2014  . Nausea & vomiting 10/08/2014  . Headache, tension type, chronic 09/07/2012  . Abdominal pain 06/15/2012    Past Surgical History:  Procedure Laterality Date  . kidney infections    . TUBAL LIGATION    . WISDOM TOOTH EXTRACTION       OB History    Gravida  4   Para  4   Term  1   Preterm  3   AB      Living  4     SAB      TAB      Ectopic      Multiple      Live Births  4            Home Medications    Prior to Admission medications   Medication Sig Start Date End Date Taking? Authorizing Provider  albuterol (PROVENTIL HFA;VENTOLIN HFA) 108 (90 BASE) MCG/ACT inhaler Inhale 2 puffs into the lungs every 6 (six) hours as needed for wheezing or shortness of breath. Reported on 01/13/2015 01/14/12  Yes Shubin, Valora Corporal, MD  folic acid (FOLVITE) 1 MG tablet Take 1 tablet (1 mg total) by mouth daily. 10/17/14  Yes Jerald Kief, MD  promethazine (PHENERGAN) 25 MG suppository Place 1 suppository (25 mg total) rectally every 6 (six) hours as needed for nausea or vomiting. 07/28/17   Eber Hong, MD    Family History Family History  Problem Relation Age of Onset  . Hypertension Mother   . Diabetes Mother   . Heart failure Father   . Colon cancer Paternal Grandfather     Social History Social History   Tobacco Use  . Smoking status: Current Some Day Smoker    Packs/day: 0.50    Types: Cigarettes  Last attempt to quit: 03/18/2015    Years since quitting: 2.3  . Smokeless tobacco: Never Used  Substance Use Topics  . Alcohol use: Not Currently  . Drug use: Yes    Types: Marijuana    Comment: lase used April 2019     Allergies   Metoclopramide; Tomato; Aspirin; Doxycycline; Hydromorphone hcl; Ibuprofen; Ondansetron; Other; Shellfish allergy; Toradol [ketorolac tromethamine]; Latex; and Tramadol   Review of Systems Review of Systems  All other systems reviewed and are negative.    Physical Exam Updated Vital Signs BP (!) 135/98   Pulse 92   Temp 99.1 F (37.3 C) (Oral)   Resp 17   Ht 5' (1.524 m)   Wt 81.6 kg (180 lb)   LMP 07/16/2017   SpO2 100%   BMI 35.15 kg/m   Physical Exam  Constitutional: She appears well-developed and well-nourished. No distress.  HENT:  Head: Normocephalic and atraumatic.  Mouth/Throat: No oropharyngeal  exudate.  Mucous membranes are dehydrated  Eyes: Pupils are equal, round, and reactive to light. Conjunctivae and EOM are normal. Right eye exhibits no discharge. Left eye exhibits no discharge. No scleral icterus.  Neck: Normal range of motion. Neck supple. No JVD present. No thyromegaly present.  Cardiovascular: Normal rate, regular rhythm, normal heart sounds and intact distal pulses. Exam reveals no gallop and no friction rub.  No murmur heard. Pulmonary/Chest: Effort normal and breath sounds normal. No respiratory distress. She has no wheezes. She has no rales.  Abdominal: Soft. Bowel sounds are normal. She exhibits no distension and no mass. There is tenderness ( Mild diffuse tenderness without guarding).  Musculoskeletal: Normal range of motion. She exhibits no edema or tenderness.  Lymphadenopathy:    She has no cervical adenopathy.  Neurological: She is alert. Coordination normal.  Skin: Skin is warm and dry. No rash noted. No erythema.  Psychiatric: She has a normal mood and affect. Her behavior is normal.  Nursing note and vitals reviewed.    ED Treatments / Results  Labs (all labs ordered are listed, but only abnormal results are displayed) Labs Reviewed  COMPREHENSIVE METABOLIC PANEL - Abnormal; Notable for the following components:      Result Value   Potassium 3.3 (*)    BUN 5 (*)    Calcium 8.0 (*)    AST 14 (*)    All other components within normal limits  CBC WITH DIFFERENTIAL/PLATELET - Abnormal; Notable for the following components:   WBC 17.2 (*)    HCT 34.6 (*)    Neutro Abs 13.6 (*)    All other components within normal limits  URINALYSIS, ROUTINE W REFLEX MICROSCOPIC - Abnormal; Notable for the following components:   Color, Urine STRAW (*)    Specific Gravity, Urine 1.004 (*)    Hgb urine dipstick SMALL (*)    Ketones, ur 5 (*)    All other components within normal limits  LIPASE, BLOOD    EKG EKG Interpretation  Date/Time:  Friday July 28 2017  07:30:55 EDT Ventricular Rate:  90 PR Interval:    QRS Duration: 92 QT Interval:  361 QTC Calculation: 442 R Axis:   68 Text Interpretation:  Sinus rhythm Nonspecific repol abnormality, inferior leads Borderline ST elevation, lateral leads since last tracing no significant change Confirmed by Eber Hong ([id number]) on 07/28/2017 7:33:41 AM   Radiology No results found.  Procedures Procedures (including critical care time)  Medications Ordered in ED Medications  sodium chloride 0.9 % bolus 1,000 mL (0  mLs Intravenous Stopped 07/28/17 0958)  promethazine (PHENERGAN) injection 25 mg (25 mg Intravenous Given 07/28/17 0800)  prochlorperazine (COMPAZINE) injection 10 mg (10 mg Intravenous Given 07/28/17 1052)  sodium chloride 0.9 % bolus 1,000 mL (1,000 mLs Intravenous New Bag/Given 07/28/17 1052)     Initial Impression / Assessment and Plan / ED Course  I have reviewed the triage vital signs and the nursing notes.  Pertinent labs & imaging results that were available during my care of the patient were reviewed by me and considered in my medical decision making (see chart for details).    The patient has chronic pain syndrome, fibromyalgia, she has an abdomen which is nonsurgical and dry mucous membranes consistent with being dehydrated.  This may just be a gastroenteritis.  She does not have any focal abdominal tenderness to suggest cholecystitis appendicitis ovarian torsion or perforated bowel.  Because she has had multiple work-ups in the past and that this is a fairly nonspecific illness I do not think that a CT scan is warranted at this time.  Will obtain some blood work urinalysis and hydrate the patient.  She is in agreement with the plan.  Labs unremarkable other than slight elevation of white blood cells, vital signs improved, urinalysis without infection, mild ketones, 2 L of IV fluids given.  Patient improved and stable for discharge   Final Clinical Impressions(s) / ED Diagnoses     Final diagnoses:  Nausea vomiting and diarrhea    ED Discharge Orders        Ordered    promethazine (PHENERGAN) 25 MG suppository  Every 6 hours PRN     07/28/17 1257       Eber Hong, MD 07/28/17 1258

## 2017-08-20 IMAGING — DX DG ABDOMEN ACUTE W/ 1V CHEST
3 series · 3 of 3 positions shown · non-contrast
Comparison: CT 10/10/2014

CLINICAL DATA: Nausea and vomiting with weakness.

EXAM:
DG ABDOMEN ACUTE W/ 1V CHEST

[chest pa]
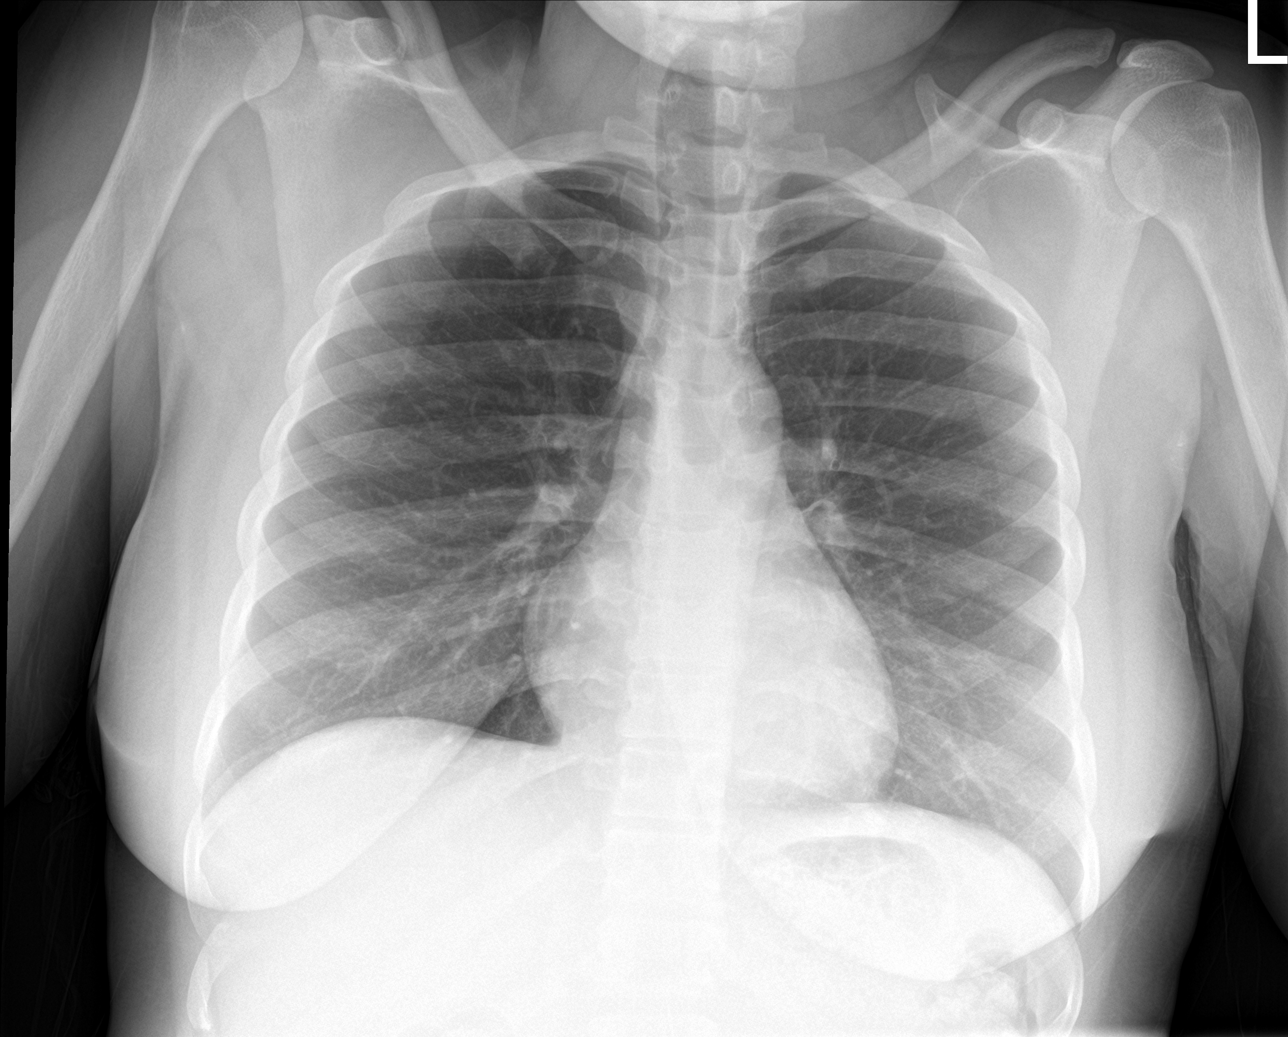

[abdomen erect]
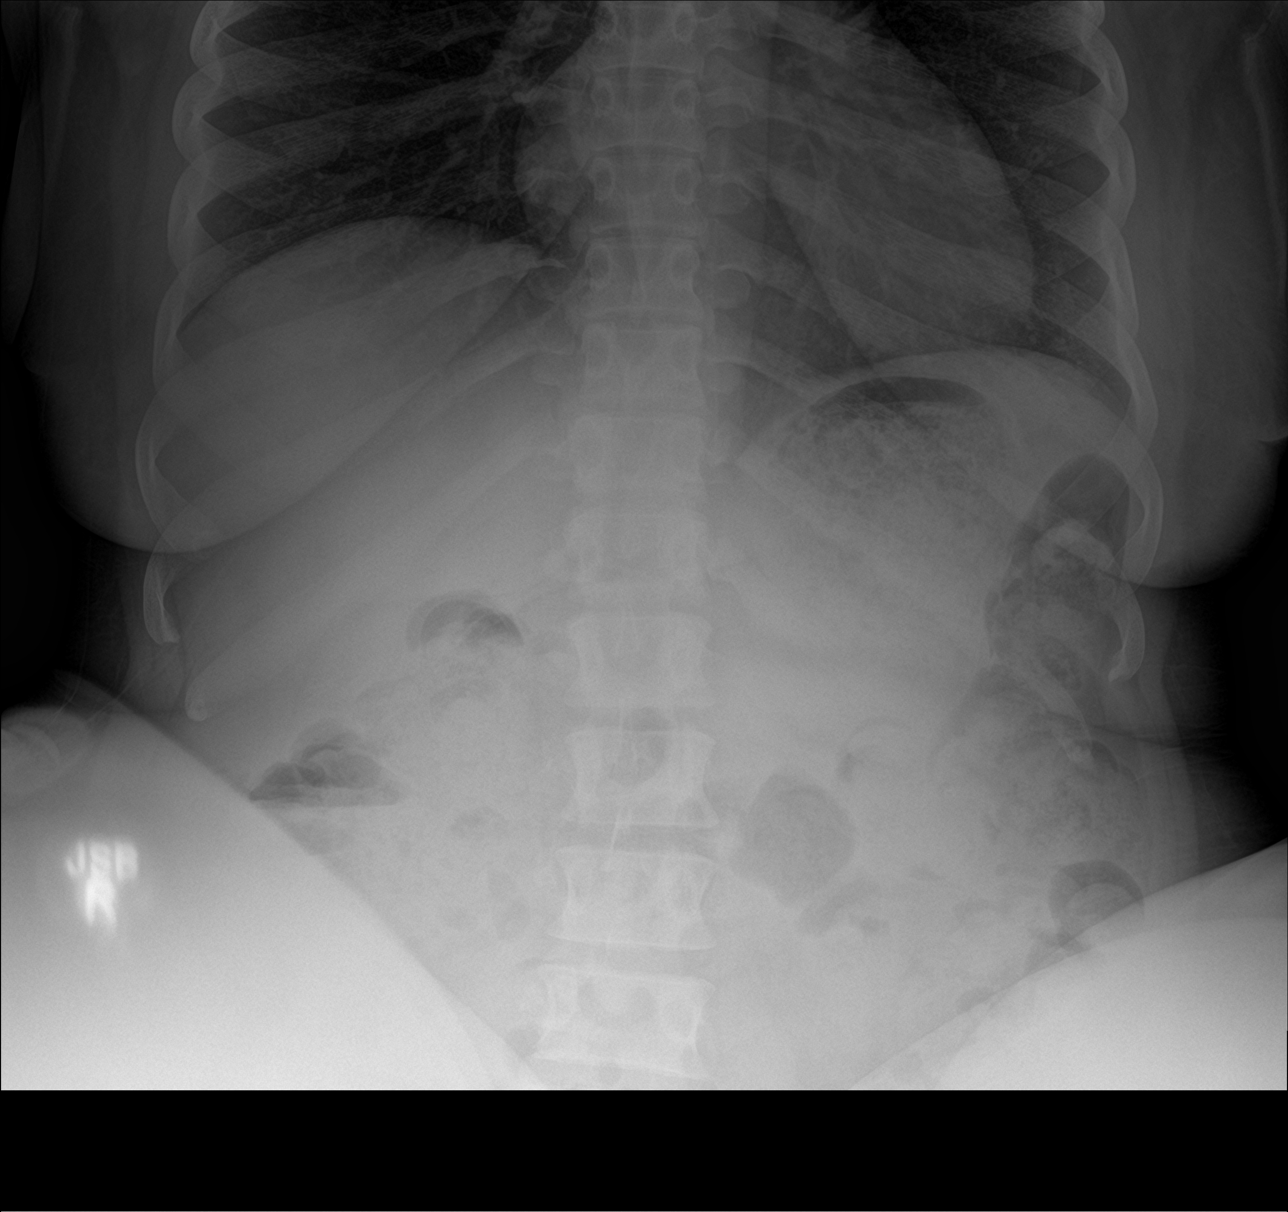

[abdomen supine]
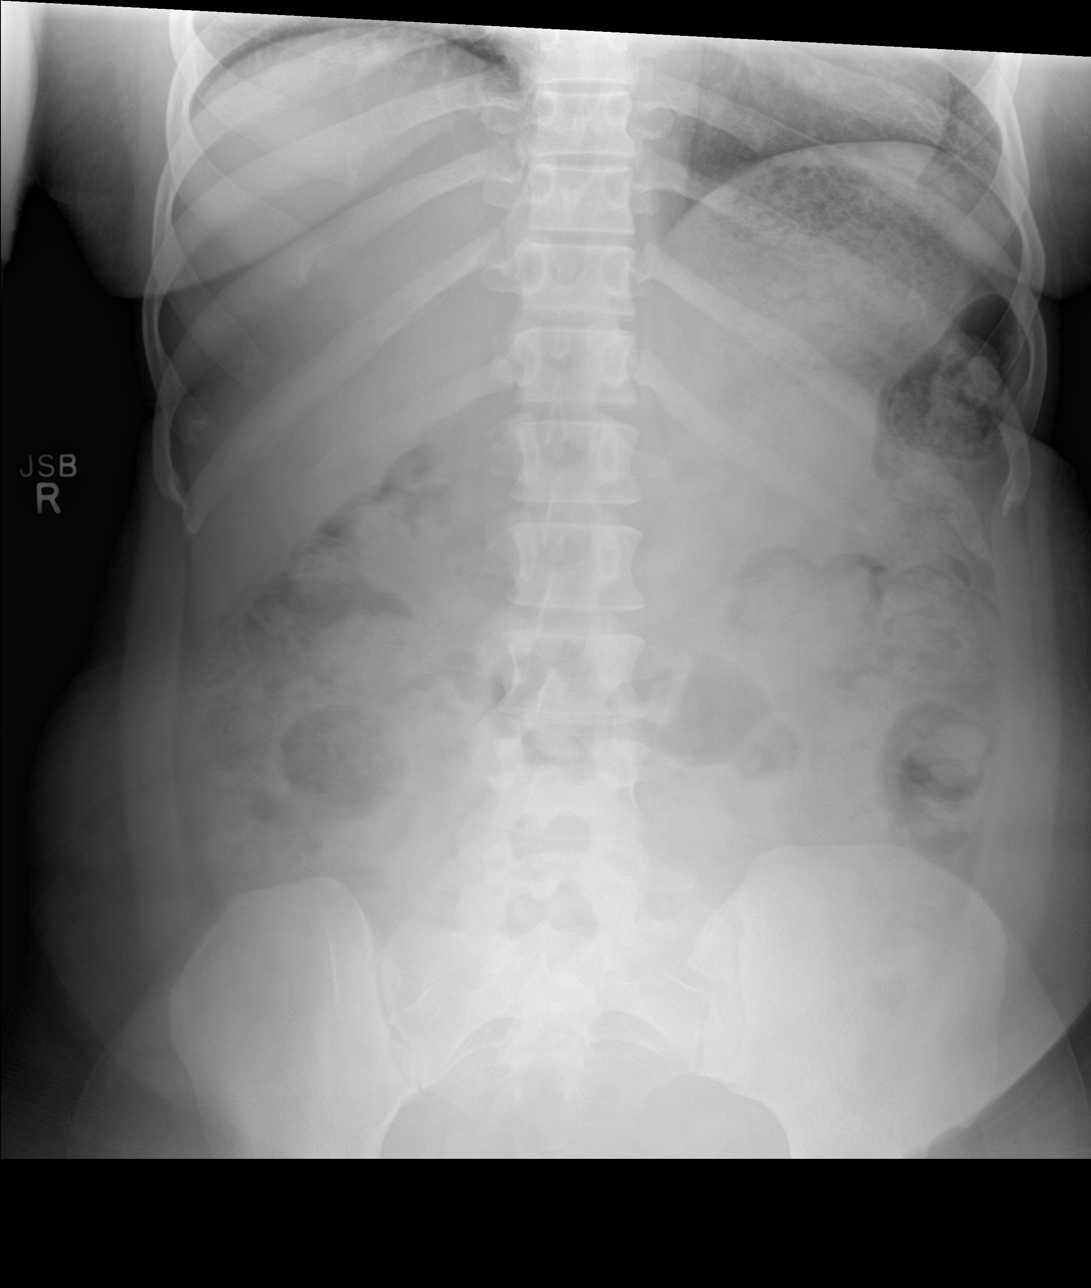

[3 of 3 positions shown; findings below may reference images not displayed]

FINDINGS: The cardiomediastinal contours are normal. The lungs are clear.
There is no free intra-abdominal air. No dilated bowel loops to
suggest obstruction. The stomach distended with ingested contents.
Moderate to large volume of stool throughout the colon. No
radiopaque calculi. No acute osseous abnormalities are seen.
IMPRESSION: 1. Moderate to large volume of stool suggesting constipation. No
bowel obstruction.
2. Clear lungs.

## 2017-10-27 ENCOUNTER — Emergency Department (HOSPITAL_COMMUNITY)
Admission: EM | Admit: 2017-10-27 | Discharge: 2017-10-27 | Disposition: A | Discharge status: Home / Self Care | Payer: Medicaid Other | Attending: Emergency Medicine | Admitting: Emergency Medicine

## 2017-10-27 ENCOUNTER — Encounter (HOSPITAL_COMMUNITY): Payer: Self-pay | Admitting: Emergency Medicine

## 2017-10-27 ENCOUNTER — Other Ambulatory Visit: Payer: Self-pay

## 2017-10-27 DIAGNOSIS — R1110 Vomiting, unspecified: Secondary | ICD-10-CM | POA: Diagnosis not present

## 2017-10-27 DIAGNOSIS — J45909 Unspecified asthma, uncomplicated: Secondary | ICD-10-CM | POA: Insufficient documentation

## 2017-10-27 DIAGNOSIS — F1210 Cannabis abuse, uncomplicated: Secondary | ICD-10-CM | POA: Insufficient documentation

## 2017-10-27 DIAGNOSIS — I10 Essential (primary) hypertension: Secondary | ICD-10-CM | POA: Insufficient documentation

## 2017-10-27 DIAGNOSIS — F17210 Nicotine dependence, cigarettes, uncomplicated: Secondary | ICD-10-CM | POA: Insufficient documentation

## 2017-10-27 DIAGNOSIS — R51 Headache: Secondary | ICD-10-CM | POA: Diagnosis present

## 2017-10-27 DIAGNOSIS — Z91040 Latex allergy status: Secondary | ICD-10-CM | POA: Insufficient documentation

## 2017-10-27 DIAGNOSIS — G43909 Migraine, unspecified, not intractable, without status migrainosus: Secondary | ICD-10-CM | POA: Diagnosis not present

## 2017-10-27 LAB — I-STAT CHEM 8, ED
BUN: 8 mg/dL (ref 6–20)
CREATININE: 0.5 mg/dL (ref 0.44–1.00)
Calcium, Ion: 1.12 mmol/L — ABNORMAL LOW (ref 1.15–1.40)
Chloride: 107 mmol/L (ref 98–111)
Glucose, Bld: 80 mg/dL (ref 70–99)
HEMATOCRIT: 34 % — AB (ref 36.0–46.0)
HEMOGLOBIN: 11.6 g/dL — AB (ref 12.0–15.0)
POTASSIUM: 3.7 mmol/L (ref 3.5–5.1)
SODIUM: 140 mmol/L (ref 135–145)
TCO2: 22 mmol/L (ref 22–32)

## 2017-10-27 MED ORDER — PROCHLORPERAZINE EDISYLATE 10 MG/2ML IJ SOLN
10.0000 mg | Freq: Once | INTRAMUSCULAR | Status: AC
  Administered 2017-10-27: 10 mg via INTRAVENOUS
  Filled 2017-10-27: qty 2

## 2017-10-27 MED ORDER — DIPHENHYDRAMINE HCL 50 MG/ML IJ SOLN
25.0000 mg | Freq: Once | INTRAMUSCULAR | Status: AC
  Administered 2017-10-27: 25 mg via INTRAVENOUS
  Filled 2017-10-27: qty 1

## 2017-10-27 MED ORDER — SODIUM CHLORIDE 0.9 % IV BOLUS
1000.0000 mL | Freq: Once | INTRAVENOUS | Status: AC
  Administered 2017-10-27: 1000 mL via INTRAVENOUS

## 2017-10-27 NOTE — ED Notes (Signed)
Tolerated PO challenge well.

## 2017-10-27 NOTE — Discharge Instructions (Addendum)
Follow up with your primary care physician as needed. Return if concern for any reason °

## 2017-10-27 NOTE — ED Provider Notes (Signed)
[facility] EMERGENCY DEPARTMENT Provider Note   CSN: [id number] Arrival date & time: 10/27/17  3818     History   Chief Complaint Chief Complaint  Patient presents with  . Headache    HPI Tanya Krause is a 32 y.o. female.  HPI Complains of diffuse headache gradual onset 2 days ago accompanied by diffuse body aches typical of migraine/cluster headaches.  She also reports multiple episodes of vomiting over the past 2 days typical for migraines.  She treated herself with Tylenol, without relief.  No other associated symptoms. Past Medical History:  Diagnosis Date  . Asthma   . Chronic abdominal pain   . Chronic migraine   . Chronic pain syndrome   . Fibromyalgia   . 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 Hypertension   . Migraine   . Pelvic inflammatory disease (PID)     Patient Active Problem List   Diagnosis Date Noted  . Colitis 08/12/2015  . Oral thrush 11/18/2014  . B12 deficiency 11/17/2014  . Chronic pain syndrome 11/17/2014  . Numbness 11/17/2014  . Cephalalgia 11/17/2014  . Intractable chronic migraine without aura and without status migrainosus 11/17/2014  . Chronic migraine 10/11/2014  . Fibromyalgia 10/11/2014  . Lower extremity weakness 10/11/2014  . Vitamin B12 deficiency 10/11/2014  . Anxiety state 10/11/2014  . Nausea & vomiting 10/08/2014  . Headache, tension type, chronic 09/07/2012  . Abdominal pain 06/15/2012   Hypertension Past Surgical History:  Procedure Laterality Date  . kidney infections    . TUBAL LIGATION    . WISDOM TOOTH EXTRACTION       OB History    Gravida  4   Para  4   Term  1   Preterm  3   AB      Living  4     SAB      TAB      Ectopic      Multiple      Live Births  4            Home Medications    Prior to Admission medications   Medication Sig Start Date End Date Taking? Authorizing Provider  albuterol (PROVENTIL HFA;VENTOLIN HFA) 108 (90 BASE) MCG/ACT inhaler Inhale 2 puffs into  the lungs every 6 (six) hours as needed for wheezing or shortness of breath. Reported on 01/13/2015 01/14/12   Consuello Masse, MD  folic acid (FOLVITE) 1 MG tablet Take 1 tablet (1 mg total) by mouth daily. 10/17/14   Jerald Kief, MD  promethazine (PHENERGAN) 25 MG suppository Place 1 suppository (25 mg total) rectally every 6 (six) hours as needed for nausea or vomiting. 07/28/17   Eber Hong, MD    Family History Family History  Problem Relation Age of Onset  . Hypertension Mother   . Diabetes Mother   . Heart failure Father   . Colon cancer Paternal Grandfather     Social History Social History   Tobacco Use  . Smoking status: Current Some Day Smoker    Packs/day: 0.50    Types: Cigarettes    Last attempt to quit: 03/18/2015    Years since quitting: 2.6  . Smokeless tobacco: Never Used  Substance Use Topics  . Alcohol use: Not Currently  . Drug use: Yes    Types: Marijuana    Comment: lase used April 2019  Marijuana last used this week no other drug use   Allergies   Metoclopramide; Tomato; Aspirin; Doxycycline;  Hydromorphone hcl; Ibuprofen; Ondansetron; Other; Shellfish allergy; Toradol [ketorolac tromethamine]; Latex; and Tramadol   Review of Systems Review of Systems  Constitutional: Negative.   HENT: Negative.   Respiratory: Negative.   Cardiovascular: Negative.   Gastrointestinal: Positive for nausea and vomiting.  Musculoskeletal: Positive for myalgias.  Skin: Negative.   Neurological: Positive for headaches.  Psychiatric/Behavioral: Negative.   All other systems reviewed and are negative.    Physical Exam Updated Vital Signs BP (!) 121/96 (BP Location: Left Arm)   Pulse 83   Temp 98.6 F (37 C) (Oral)   Resp 16   Ht 5' (1.524 m)   Wt 74.8 kg   LMP 10/06/2017 (Exact Date)   SpO2 100%   BMI 32.22 kg/m   Physical Exam  Constitutional: She is oriented to person, place, and time. She appears well-developed and well-nourished. No distress.    HENT:  Head: Normocephalic and atraumatic.  Eyes: Pupils are equal, round, and reactive to light. Conjunctivae are normal.  Neck: Neck supple. No tracheal deviation present. No thyromegaly present.  Cardiovascular: Normal rate and regular rhythm.  No murmur heard. Pulmonary/Chest: Effort normal and breath sounds normal.  Abdominal: Soft. Bowel sounds are normal. She exhibits no distension. There is no tenderness.  Musculoskeletal: Normal range of motion. She exhibits no edema or tenderness.  Neurological: She is alert and oriented to person, place, and time. No cranial nerve deficit. Coordination normal.  Gait normal Romberg normal pronator drift normal DTRs symmetric bilaterally at knee jerk ankle jerk and biceps was downgoing bilaterally  Skin: Skin is warm and dry. No rash noted.  Psychiatric: She has a normal mood and affect.  Nursing note and vitals reviewed.    ED Treatments / Results  Labs (all labs ordered are listed, but only abnormal results are displayed) Labs Reviewed  I-STAT CHEM 8, ED    EKG None  Radiology No results found.  Procedures Procedures (including critical care time)  Medications Ordered in ED Medications  prochlorperazine (COMPAZINE) injection 10 mg (has no administration in time range)  diphenhydrAMINE (BENADRYL) injection 25 mg (has no administration in time range)  sodium chloride 0.9 % bolus 1,000 mL (has no administration in time range)   Results for orders placed or performed during the hospital encounter of 10/27/17  I-stat chem 8, ed  Result Value Ref Range   Sodium 140 135 - 145 mmol/L   Potassium 3.7 3.5 - 5.1 mmol/L   Chloride 107 98 - 111 mmol/L   BUN 8 6 - 20 mg/dL   Creatinine, Ser 2.35 0.44 - 1.00 mg/dL   Glucose, Bld 80 70 - 99 mg/dL   Calcium, Ion 5.73 (L) 1.15 - 1.40 mmol/L   TCO2 22 22 - 32 mmol/L   Hemoglobin 11.6 (L) 12.0 - 15.0 g/dL   HCT 22.0 (L) 25.4 - 27.0 %   No results found.  Initial Impression / Assessment  and Plan / ED Course  I have reviewed the triage vital signs and the nursing notes.  Pertinent labs & imaging results that were available during my care of the patient were reviewed by me and considered in my medical decision making (see chart for details).     9:25 AM patient feels much improved and ready to go home after treatment with intravenous Compazine, Benadryl and IV normal saline bolus.  She is able to drink without vomiting she is alert and ambulates without difficulty. Follow-up with PMD as needed Final Clinical Impressions(s) / ED Diagnoses  Diagnosis  migraine headache, not intractable Final diagnoses:  None    ED Discharge Orders    None       Doug Sou, MD 10/27/17 [phone]

## 2017-10-27 NOTE — ED Triage Notes (Signed)
Headache and body achessince Wednesday.  Rates pain 10/10.

## 2018-03-08 ENCOUNTER — Encounter (HOSPITAL_COMMUNITY): Payer: Self-pay | Admitting: Emergency Medicine

## 2018-03-08 ENCOUNTER — Emergency Department (HOSPITAL_COMMUNITY)
Admission: EM | Admit: 2018-03-08 | Discharge: 2018-03-08 | Disposition: A | Discharge status: Home / Self Care | Payer: Medicaid Other | Attending: Emergency Medicine | Admitting: Emergency Medicine

## 2018-03-08 ENCOUNTER — Emergency Department (HOSPITAL_COMMUNITY): Payer: Medicaid Other

## 2018-03-08 ENCOUNTER — Other Ambulatory Visit: Payer: Self-pay

## 2018-03-08 DIAGNOSIS — Z79899 Other long term (current) drug therapy: Secondary | ICD-10-CM | POA: Diagnosis not present

## 2018-03-08 DIAGNOSIS — Y92019 Unspecified place in single-family (private) house as the place of occurrence of the external cause: Secondary | ICD-10-CM | POA: Diagnosis not present

## 2018-03-08 DIAGNOSIS — F17210 Nicotine dependence, cigarettes, uncomplicated: Secondary | ICD-10-CM | POA: Insufficient documentation

## 2018-03-08 DIAGNOSIS — W0110XA Fall on same level from slipping, tripping and stumbling with subsequent striking against unspecified object, initial encounter: Secondary | ICD-10-CM | POA: Diagnosis not present

## 2018-03-08 DIAGNOSIS — I10 Essential (primary) hypertension: Secondary | ICD-10-CM | POA: Diagnosis not present

## 2018-03-08 DIAGNOSIS — Z91040 Latex allergy status: Secondary | ICD-10-CM | POA: Insufficient documentation

## 2018-03-08 DIAGNOSIS — Y9301 Activity, walking, marching and hiking: Secondary | ICD-10-CM | POA: Diagnosis not present

## 2018-03-08 DIAGNOSIS — N3000 Acute cystitis without hematuria: Secondary | ICD-10-CM | POA: Diagnosis not present

## 2018-03-08 DIAGNOSIS — Y999 Unspecified external cause status: Secondary | ICD-10-CM | POA: Diagnosis not present

## 2018-03-08 DIAGNOSIS — M545 Low back pain: Secondary | ICD-10-CM | POA: Diagnosis present

## 2018-03-08 DIAGNOSIS — M5441 Lumbago with sciatica, right side: Secondary | ICD-10-CM

## 2018-03-08 DIAGNOSIS — W19XXXA Unspecified fall, initial encounter: Secondary | ICD-10-CM

## 2018-03-08 DIAGNOSIS — J45909 Unspecified asthma, uncomplicated: Secondary | ICD-10-CM | POA: Insufficient documentation

## 2018-03-08 LAB — I-STAT BETA HCG BLOOD, ED (MC, WL, AP ONLY): I-stat hCG, quantitative: <5 m[IU]/mL (ref <5)

## 2018-03-08 LAB — BASIC METABOLIC PANEL
Anion gap: 5 (ref 5–15)
BUN: 8 mg/dL (ref 6–20)
CO2: 24 mmol/L (ref 22–32)
Calcium: 8.6 mg/dL — ABNORMAL LOW (ref 8.9–10.3)
Chloride: 106 mmol/L (ref 98–111)
Creatinine, Ser: 0.7 mg/dL (ref 0.44–1.00)
GFR calc Af Amer: >60 mL/min (ref >60)
Glucose, Bld: 90 mg/dL (ref 70–99)
POTASSIUM: 3.6 mmol/L (ref 3.5–5.1)
Sodium: 135 mmol/L (ref 135–145)

## 2018-03-08 LAB — URINALYSIS, ROUTINE W REFLEX MICROSCOPIC
BILIRUBIN URINE: NEGATIVE
GLUCOSE, UA: NEGATIVE mg/dL
HGB URINE DIPSTICK: NEGATIVE
KETONES UR: NEGATIVE mg/dL
Nitrite: NEGATIVE
PH: 7 (ref 5.0–8.0)
PROTEIN: NEGATIVE mg/dL
Specific Gravity, Urine: 1.013 (ref 1.005–1.030)

## 2018-03-08 LAB — CBC WITH DIFFERENTIAL/PLATELET
Abs Immature Granulocytes: 0.02 10*3/uL (ref 0.00–0.07)
Basophils Absolute: 0.1 10*3/uL (ref 0.0–0.1)
Basophils Relative: 1 %
Eosinophils Absolute: 0.2 10*3/uL (ref 0.0–0.5)
Eosinophils Relative: 3 %
HCT: 37.6 % (ref 36.0–46.0)
Hemoglobin: 12.3 g/dL (ref 12.0–15.0)
Immature Granulocytes: 0 %
Lymphocytes Relative: 47 %
Lymphs Abs: 4.3 10*3/uL — ABNORMAL HIGH (ref 0.7–4.0)
MCH: 27.3 pg (ref 26.0–34.0)
MCHC: 32.7 g/dL (ref 30.0–36.0)
MCV: 83.6 fL (ref 80.0–100.0)
Monocytes Absolute: 0.7 10*3/uL (ref 0.1–1.0)
Monocytes Relative: 8 %
Neutro Abs: 3.6 10*3/uL (ref 1.7–7.7)
Neutrophils Relative %: 41 %
Platelets: 246 10*3/uL (ref 150–400)
RBC: 4.5 MIL/uL (ref 3.87–5.11)
RDW: 13.3 % (ref 11.5–15.5)
WBC: 8.9 10*3/uL (ref 4.0–10.5)
nRBC: 0 % (ref 0.0–0.2)

## 2018-03-08 LAB — POC URINE PREG, ED: Preg Test, Ur: NEGATIVE

## 2018-03-08 MED ORDER — PROMETHAZINE HCL 25 MG/ML IJ SOLN
12.5000 mg | Freq: Once | INTRAMUSCULAR | Status: AC
  Administered 2018-03-08: 12.5 mg via INTRAVENOUS
  Filled 2018-03-08: qty 1

## 2018-03-08 MED ORDER — SODIUM CHLORIDE 0.9 % IV BOLUS
1000.0000 mL | Freq: Once | INTRAVENOUS | Status: AC
  Administered 2018-03-08: 1000 mL via INTRAVENOUS

## 2018-03-08 MED ORDER — DIPHENHYDRAMINE HCL 50 MG/ML IJ SOLN
12.5000 mg | Freq: Once | INTRAMUSCULAR | Status: AC
  Administered 2018-03-08: 12.5 mg via INTRAVENOUS
  Filled 2018-03-08: qty 1

## 2018-03-08 MED ORDER — FENTANYL CITRATE (PF) 100 MCG/2ML IJ SOLN
50.0000 ug | Freq: Once | INTRAMUSCULAR | Status: AC
  Administered 2018-03-08: 50 ug via INTRAVENOUS
  Filled 2018-03-08: qty 2

## 2018-03-08 MED ORDER — CEPHALEXIN 500 MG PO CAPS: 500.0000 mg | ORAL_CAPSULE | Freq: Four times a day (QID) | ORAL | 0 refills | Status: DC

## 2018-03-08 NOTE — ED Notes (Signed)
Bed pan given to patient

## 2018-03-08 NOTE — ED Provider Notes (Signed)
[facility] EMERGENCY DEPARTMENT Provider Note   CSN: [id number] Arrival date & time: 03/08/18  1513    History   Chief Complaint Chief Complaint  Patient presents with  . Fall    HPI Tanya Krause is a 33 y.o. female.     HPI   Tanya Krause is a 33 y.o. female with a hx of chronic pain, nausea and vomiting, and fibromyalgia who presents to the Emergency Department complaining of low back pain and right hip pain.  She reports history of the same, but states the pain has been worsened since a fall that occurred yesterday.  She states that she was using her walker inside her home when "my legs gave out" causing her to fall.  Since that time, she reports worsening pain to her mid lower back that radiates to her right buttock and hip.  Pain is worse with movement and she also endorses a pins-and-needles and sensation to both legs that is constant and was also present prior to the fall.  She states her PCP is currently trying to get her into pain management.  Since yesterday, she states her pain has been so bad that she has been nauseated and vomiting.  She reports several episodes of vomiting today and unable to keep down any food or fluids.  She denies chest pain, shortness of breath, abdominal pain, fever, chills, head injury, neck pain or LOC.  She denies any urinary or bowel incontinence or retention, but does endorse some urinary frequency   Past Medical History:  Diagnosis Date  . Asthma   . Chronic abdominal pain   . Chronic migraine   . Chronic pain syndrome   . Fibromyalgia   . 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 Hypertension   . Migraine   . Pelvic inflammatory disease (PID)     Patient Active Problem List   Diagnosis Date Noted  . Colitis 08/12/2015  . Oral thrush 11/18/2014  . B12 deficiency 11/17/2014  . Chronic pain syndrome 11/17/2014  . Numbness 11/17/2014  . Cephalalgia 11/17/2014  . Intractable chronic migraine without aura and without status  migrainosus 11/17/2014  . Chronic migraine 10/11/2014  . Fibromyalgia 10/11/2014  . Lower extremity weakness 10/11/2014  . Vitamin B12 deficiency 10/11/2014  . Anxiety state 10/11/2014  . Nausea & vomiting 10/08/2014  . Headache, tension type, chronic 09/07/2012  . Abdominal pain 06/15/2012    Past Surgical History:  Procedure Laterality Date  . kidney infections    . TUBAL LIGATION    . WISDOM TOOTH EXTRACTION       OB History    Gravida  4   Para  4   Term  1   Preterm  3   AB      Living  4     SAB      TAB      Ectopic      Multiple      Live Births  4            Home Medications    Prior to Admission medications   Medication Sig Start Date End Date Taking? Authorizing Provider  albuterol (PROVENTIL HFA;VENTOLIN HFA) 108 (90 BASE) MCG/ACT inhaler Inhale 2 puffs into the lungs every 6 (six) hours as needed for wheezing or shortness of breath. Reported on 01/13/2015 01/14/12   Consuello Masse, MD  amitriptyline (ELAVIL) 50 MG tablet Take 50 mg by mouth at bedtime. 07/31/17   [provider]  cephALEXin (KEFLEX) 500 MG capsule Take 1 capsule (500 mg total) by mouth 4 (four) times daily. 03/08/18   Elaysia Devargas, PA-C  folic acid (FOLVITE) 1 MG tablet Take 1 tablet (1 mg total) by mouth daily. 10/17/14   Jerald Kief, MD  promethazine (PHENERGAN) 25 MG suppository Place 1 suppository (25 mg total) rectally every 6 (six) hours as needed for nausea or vomiting. 07/28/17   Eber Hong, MD    Family History Family History  Problem Relation Age of Onset  . Hypertension Mother   . Diabetes Mother   . Heart failure Father   . Colon cancer Paternal Grandfather     Social History Social History   Tobacco Use  . Smoking status: Current Some Day Smoker    Packs/day: 0.50    Types: Cigarettes    Last attempt to quit: 03/18/2015    Years since quitting: 2.9  . Smokeless tobacco: Never Used  Substance Use Topics  . Alcohol use: Not Currently    . Drug use: Yes    Types: Marijuana    Comment: lase used April 2019     Allergies   Metoclopramide; Tomato; Aspirin; Doxycycline; Hydromorphone hcl; Ibuprofen; Ondansetron; Other; Shellfish allergy; Toradol [ketorolac tromethamine]; Latex; and Tramadol   Review of Systems Review of Systems  Constitutional: Negative for fever.  Eyes: Negative for visual disturbance.  Respiratory: Negative for shortness of breath.   Gastrointestinal: Positive for nausea and vomiting. Negative for abdominal pain and constipation.  Genitourinary: Negative for decreased urine volume, difficulty urinating, dysuria, flank pain and hematuria.       Urinary frequency  Musculoskeletal: Positive for back pain. Negative for joint swelling and neck pain.  Skin: Negative for rash.  Neurological: Negative for dizziness, syncope, speech difficulty, weakness, numbness ("pins and needles" sensation to both legs) and headaches.     Physical Exam Updated Vital Signs BP 109/76 (BP Location: Right Arm)   Pulse 77   Temp 98 F (36.7 C) (Oral)   Resp 17   LMP 02/20/2018   SpO2 100%   Physical Exam Vitals signs and nursing note reviewed.  Constitutional:      General: She is not in acute distress.    Appearance: She is well-developed.  HENT:     Head: Normocephalic and atraumatic.     Mouth/Throat:     Mouth: Mucous membranes are dry.  Eyes:     Extraocular Movements: Extraocular movements intact.     Pupils: Pupils are equal, round, and reactive to light.  Neck:     Musculoskeletal: Normal range of motion and neck supple.  Cardiovascular:     Rate and Rhythm: Normal rate and regular rhythm.     Pulses: Normal pulses.     Comments: DP pulses are strong and palpable bilaterally Pulmonary:     Effort: Pulmonary effort is normal. No respiratory distress.     Breath sounds: Normal breath sounds.  Abdominal:     General: There is no distension.     Palpations: Abdomen is soft.     Tenderness: There is  no abdominal tenderness. There is no right CVA tenderness or left CVA tenderness.  Musculoskeletal:        General: Tenderness and signs of injury present. No swelling or deformity.     Lumbar back: She exhibits tenderness and pain. She exhibits normal range of motion, no swelling, no deformity, no laceration and normal pulse.     Comments: Diffuse ttp of midline lower spine and right  lumbar paraspinal muscles.  No bony step offs or abrasions.  Pt has 4/5 strength against resistance of bilateral lower extremities.     Skin:    General: Skin is warm and dry.     Capillary Refill: Capillary refill takes less than 2 seconds.     Findings: No rash.  Neurological:     General: No focal deficit present.     Mental Status: She is alert.     GCS: GCS eye subscore is 4. GCS verbal subscore is 5. GCS motor subscore is 6.     Sensory: No sensory deficit.     Motor: Motor function is intact. No abnormal muscle tone.     Coordination: Coordination is intact. Coordination normal.     Gait: Gait normal.     Deep Tendon Reflexes:     Reflex Scores:      Patellar reflexes are 2+ on the right side and 2+ on the left side.      Achilles reflexes are 2+ on the right side and 2+ on the left side.    Comments: CN II-XII grossly intact, speech clear      ED Treatments / Results  Labs (all labs ordered are listed, but only abnormal results are displayed) Labs Reviewed  CBC WITH DIFFERENTIAL/PLATELET - Abnormal; Notable for the following components:      Result Value   Lymphs Abs 4.3 (*)    All other components within normal limits  BASIC METABOLIC PANEL - Abnormal; Notable for the following components:   Calcium 8.6 (*)    All other components within normal limits  URINALYSIS, ROUTINE W REFLEX MICROSCOPIC - Abnormal; Notable for the following components:   APPearance HAZY (*)    Leukocytes,Ua SMALL (*)    Bacteria, UA RARE (*)    All other components within normal limits  URINE CULTURE  POC URINE  PREG, ED  I-STAT BETA HCG BLOOD, ED ([facility], [facility], [facility] ONLY)    EKG None  Radiology Dg Lumbar Spine Complete  Result Date: 03/08/2018 CLINICAL DATA:  Low back pain radiating to RIGHT hip. Fell using walker yesterday. EXAM: LUMBAR SPINE - COMPLETE 4+ VIEW COMPARISON:  Lumbar spine radiograph August 27, 2016 FINDINGS: Five non rib-bearing lumbar-type vertebral bodies are intact. No malalignment. Maintained lumbar lordosis. Intervertebral disc heights maintained. No destructive bony lesions. Sacroiliac joints are symmetric. Included prevertebral and paraspinal soft tissue planes are non-suspicious. IMPRESSION: Negative. Electronically Signed   By: Awilda Metroourtnay  Bloomer M.D.   On: 03/08/2018 20:32   Dg Hip Unilat W Or Wo Pelvis 2-3 Views Right  Result Date: 03/08/2018 CLINICAL DATA:  Low back pain radiating to RIGHT hip. Fell using walker yesterday. EXAM: DG HIP (WITH OR WITHOUT PELVIS) 2-3V RIGHT COMPARISON:  None. FINDINGS: Femoral heads are well formed and located. Hip joint spaces are intact. Sacroiliac joints are symmetric. No advanced arthropathy. No destructive bony lesions. Included soft tissue planes are non-suspicious. IMPRESSION: Negative. Electronically Signed   By: Awilda Metroourtnay  Bloomer M.D.   On: 03/08/2018 20:33    Procedures Procedures (including critical care time)  Medications Ordered in ED Medications  sodium chloride 0.9 % bolus 1,000 mL (0 mLs Intravenous Stopped 03/08/18 2046)  promethazine (PHENERGAN) injection 12.5 mg (12.5 mg Intravenous Given 03/08/18 1858)  diphenhydrAMINE (BENADRYL) injection 12.5 mg (12.5 mg Intravenous Given 03/08/18 1922)  fentaNYL (SUBLIMAZE) injection 50 mcg (50 mcg Intravenous Given 03/08/18 2051)     Initial Impression / Assessment and Plan / ED Course  I have reviewed  the triage vital signs and the nursing notes.  Pertinent labs & imaging results that were available during my care of the patient were reviewed by me and considered in my medical decision  making (see chart for details).         Patient with likely acute on chronic pain.  Neurovascularly intact.  No focal neuro deficits on exam.  Reports nausea and vomiting without vomiting during ED stay.  Mucous membranes do appear dry.  Will check labs and give IV fluids and antiemetic.  On recheck, patient reports feeling better and is now tolerating oral fluids.  Was also given a snack.  She has tolerated this well.  No further vomiting.  She appears appropriate for discharge home, she does appear to have a possible early UTI, which may be contributing to her back pain.  No fever or CVA tenderness to suggest pyelonephritis.  Urine culture is pending and I will prescribe Keflex.  She agrees to follow-up with her PCP and return precautions were discussed.  Final Clinical Impressions(s) / ED Diagnoses   Final diagnoses:  Fall, initial encounter  Acute cystitis without hematuria  Acute right-sided low back pain with right-sided sciatica    ED Discharge Orders         Ordered    cephALEXin (KEFLEX) 500 MG capsule  4 times daily     03/08/18 2054           Pauline Ausriplett, Zacchaeus Halm, PA-C 03/08/18 2115    Vanetta MuldersZackowski, Scott, MD 03/12/18 [phone]

## 2018-03-08 NOTE — Discharge Instructions (Signed)
Take the antibiotic as directed until its finished.  Small, frequent sips of fluids and bland diet as tolerated.  Follow-up with your primary doctor or pain management doctor for recheck.  Return to the ER for any worsening symptoms.

## 2018-03-08 NOTE — ED Notes (Signed)
Pt states she has to pee but states she cannot move. States her legs feel numb.

## 2018-03-08 NOTE — ED Triage Notes (Signed)
Patient reports pain since Sunday. States she has difficulty walking. Fell yesterday. Was able to get back up with assistance and walk. Patient states she has been unable to keep anything down x 3 days.

## 2018-03-10 LAB — URINE CULTURE

## 2018-04-23 ENCOUNTER — Other Ambulatory Visit: Payer: Self-pay | Admitting: Family Medicine

## 2018-04-23 DIAGNOSIS — M5412 Radiculopathy, cervical region: Secondary | ICD-10-CM

## 2018-04-23 DIAGNOSIS — M4802 Spinal stenosis, cervical region: Secondary | ICD-10-CM

## 2018-04-27 ENCOUNTER — Other Ambulatory Visit: Payer: Medicaid Other

## 2018-05-01 ENCOUNTER — Ambulatory Visit: Payer: Medicaid Other

## 2018-05-09 ENCOUNTER — Ambulatory Visit
Admission: RE | Admit: 2018-05-09 | Discharge: 2018-05-09 | Disposition: A | Discharge status: Home / Self Care | Payer: Medicaid Other | Source: Ambulatory Visit | Attending: Family Medicine | Admitting: Family Medicine

## 2018-05-09 ENCOUNTER — Other Ambulatory Visit: Payer: Self-pay

## 2018-05-09 DIAGNOSIS — M4802 Spinal stenosis, cervical region: Secondary | ICD-10-CM

## 2018-05-09 DIAGNOSIS — M5412 Radiculopathy, cervical region: Secondary | ICD-10-CM

## 2018-08-29 ENCOUNTER — Ambulatory Visit
Admission: RE | Admit: 2018-08-29 | Discharge: 2018-08-29 | Disposition: A | Discharge status: Home / Self Care | Payer: Medicaid Other | Source: Ambulatory Visit | Attending: Family Medicine | Admitting: Family Medicine

## 2018-08-29 ENCOUNTER — Other Ambulatory Visit: Payer: Self-pay

## 2018-08-29 ENCOUNTER — Other Ambulatory Visit: Payer: Self-pay | Admitting: Family Medicine

## 2018-08-29 DIAGNOSIS — R531 Weakness: Secondary | ICD-10-CM

## 2018-08-29 DIAGNOSIS — M25551 Pain in right hip: Secondary | ICD-10-CM

## 2018-08-29 DIAGNOSIS — M79606 Pain in leg, unspecified: Secondary | ICD-10-CM

## 2018-08-29 DIAGNOSIS — M545 Low back pain, unspecified: Secondary | ICD-10-CM

## 2018-09-03 ENCOUNTER — Encounter
Payer: Medicaid Other | Attending: Physical Medicine and Rehabilitation | Admitting: Physical Medicine and Rehabilitation

## 2018-09-03 ENCOUNTER — Other Ambulatory Visit: Payer: Self-pay

## 2018-09-03 ENCOUNTER — Telehealth: Payer: Self-pay

## 2018-09-03 ENCOUNTER — Encounter: Payer: Self-pay | Admitting: Physical Medicine and Rehabilitation

## 2018-09-03 VITALS — BP 134/97 | HR 88 | Ht 60.0 in | Wt 170.0 lb

## 2018-09-03 DIAGNOSIS — M797 Fibromyalgia: Secondary | ICD-10-CM

## 2018-09-03 DIAGNOSIS — G8929 Other chronic pain: Secondary | ICD-10-CM | POA: Insufficient documentation

## 2018-09-03 DIAGNOSIS — M549 Dorsalgia, unspecified: Secondary | ICD-10-CM | POA: Insufficient documentation

## 2018-09-03 MED ORDER — DULOXETINE HCL 30 MG PO CPEP: 30.0000 mg | ORAL_CAPSULE | Freq: Every day | ORAL | 2 refills | Status: DC

## 2018-09-03 NOTE — Telephone Encounter (Signed)
Needs a prescription for cymbalta medication per Dr. Dagoberto Ligas.    Diag: Primary Fibromyalgia syndrome.

## 2018-09-03 NOTE — Patient Instructions (Addendum)
   1. LE subjective weakness - Will try and see if can get another rollator vs fix current walker due to loose handles- to improve balance and gait  2. Fibromyalgia- - check your Thyroid level- TSH - off Zoloft- start Duloxetine- 30 mg daily x 1 week then 60 mg daily- for nerve pain and back pain -   3. Myofascial pain - tennis ball- 3 pack- 1- house, 2 Mama's house, 3 car- hold pressure on trigger points 2-5 minutes- until muscle relaxes - rolling pin- and roll calves, hamstrings (back of thighs) and front of thighs - THERACANE- 2-4 minutes on trigger points- - hold pressure, not massaging - Dr Sharion Balloon- Survivor's Handbook for Fibromyalgia and Myofascial Pain Syndrome-  - no more than 45 minutes of bodywork/day  3. Constipation/nausea/vomiting - mg citrate- over the counter- wait 3-4 hours then take fleet's- based on symptoms -should improve nausea (not even little balls of stool currently)  4. Reduce smoking in the next 6 months to zero.

## 2018-09-03 NOTE — Progress Notes (Signed)
Subjective:    Patient ID: Tanya Krause, female    DOB: 1985/07/10, 33 y.o.   MRN: [id number]   CC; chronic neck and back pain and fibromyalgia HPI   Pt reports pain in back radiates to RUE and RLE- down the arm and leg- gets into her fingers and pain goes down to the toes on RLE.  Sx's- muscles of arms and legs- feels something crawling, tight, tingling, and numb- Mainly on the right.  If pain not a 8-9/10- can get as low as 3-4/10.  Tried: Injections- epidural steroid injections- a pain doctor - used to go there, but needed another doctor that took medicaid. So, Dr Lazarus Salines is new PCP. - lasted for a short period- maybe 2 weeks Never had trigger point injections - meloxicam- not real helpful Got injection last week- toradol- helpful "for a couple of hours" Lyrica- 100 mg BID- pretty much  A little difference mainly with tingling- but not with pain Lidocaine patches- won't stick- Even without moisturizer Tried to sign up for PT- hard with COVID to get appt. Never had problems with lidocaine at dentist  Worse-  Standing, lying down is worse- has to be rotated at times Can't get comfortable on back or side- gets stiff  Gets burning/tingling in feet when walks for a few minutes- like something in her crawling. If in car, more than 10-15 minutes, gets stiff Using heating pads a LOT- even with at night.   Has lost weight lately- because clothes looser- "can't keep food down".  Was getting Rx's for Ensure- not anymore.  Tremors on R side >L side- when stands up for 3-4 minutes- will tremor- doesn't know why. After  Son- 68 yr old- in school   Scared to walk with rollator- because the handles are loose so scared will fall- in 2016/2017.  Tries to take tylenol- not working as much.    Mom/Dad/GM are paying for her meds- since they are $3 per Rx.  Pain Inventory Average Pain 7 Pain Right Now 10 My pain is sharp, burning, dull, stabbing, tingling and aching  In the  last 24 hours, has pain interfered with the following? General activity 10 Relation with others 9 Enjoyment of life 10 What TIME of day is your pain at its worst? all Sleep (in general) Poor  Pain is worse with: walking, bending, sitting, inactivity and standing Pain improves with: medication and injections Relief from Meds: 5  Mobility walk with assistance use a walker needs help with transfers transfers alone  Function I need assistance with the following:  feeding, dressing, bathing, toileting, meal prep, household duties and shopping  Neuro/Psych weakness numbness tingling trouble walking spasms dizziness depression anxiety  Prior Studies new patient  Physicians involved in your care new patient   Family History  Problem Relation Age of Onset  . Hypertension Mother   . Diabetes Mother   . Heart failure Father   . Colon cancer Paternal Grandfather    Social History   Socioeconomic History  . Marital status: Single    Spouse name: Not on file  . Number of children: 4  . Years of education: 48  . Highest education level: Not on file  Occupational History    Comment: unemployed  Social Needs  . Financial resource strain: Not on file  . Food insecurity    Worry: Not on file    Inability: Not on file  . Transportation needs    Medical: Not on file  Non-medical: Not on file  Tobacco Use  . Smoking status: Current Some Day Smoker    Packs/day: 0.50    Types: Cigarettes    Last attempt to quit: 03/18/2015    Years since quitting: 3.4  . Smokeless tobacco: Never Used  Substance and Sexual Activity  . Alcohol use: Not Currently  . Drug use: Yes    Types: Marijuana    Comment: lase used April 2019  . Sexual activity: Yes    Birth control/protection: Surgical  Lifestyle  . Physical activity    Days per week: Not on file    Minutes per session: Not on file  . Stress: Not on file  Relationships  . Social Musician on phone: Not on  file    Gets together: Not on file    Attends religious service: Not on file    Active member of club or organization: Not on file    Attends meetings of clubs or organizations: Not on file    Relationship status: Not on file  Other Topics Concern  . Not on file  Social History Narrative   Lives with parents, children   Caffeine use- coffee once a day, sodas x 2 a day   Past Surgical History:  Procedure Laterality Date  . kidney infections    . TUBAL LIGATION    . WISDOM TOOTH EXTRACTION     Past Medical History:  Diagnosis Date  . Asthma   . Chronic abdominal pain   . Chronic migraine   . Chronic pain syndrome   . Fibromyalgia   . 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 Hypertension   . Migraine   . Pelvic inflammatory disease (PID)    BP (!) 134/97   Pulse 88   Ht 5' (1.524 m)   Wt 170 lb (77.1 kg)   LMP 08/05/2018   SpO2 98%   BMI 33.20 kg/m   Opioid Risk Score:   Fall Risk Score:  `1  Depression screen PHQ 2/9  Depression screen [facility] 2/9 11/18/2014 11/18/2014  Decreased Interest 3 3  Down, Depressed, Hopeless 3 3  PHQ - 2 Score 6 6  Altered sleeping 3 -  Tired, decreased energy 3 -  Change in appetite 3 -  Feeling bad or failure about yourself  3 -  Trouble concentrating 2 -  Moving slowly or fidgety/restless 3 -  Suicidal thoughts 0 -  PHQ-9 Score 23 -   Has nurse- 7 days/week- 3 hrs/day- fixes food, takes bath, gets her dressed, takes to doctor's appt, goes to grocery store for her, etc Stays in house since hard to get around Son 17- was missing school until had nurse  Review of Systems  Constitutional: Positive for appetite change.  HENT: Negative.   Eyes: Negative.   Respiratory: Negative.   Cardiovascular: Negative.   Gastrointestinal: Positive for constipation, nausea and vomiting.  Endocrine: Negative.   Genitourinary: Negative.   Musculoskeletal: Positive for arthralgias, back pain, gait problem and myalgias.  Skin: Negative.    Allergic/Immunologic: Negative.   Neurological: Positive for dizziness, tremors, weakness and numbness.  Hematological: Negative.   Psychiatric/Behavioral: Positive for dysphoric mood. The patient is nervous/anxious.   All other systems reviewed and are negative.      Objective:   Physical Exam  Awake, alert, shaking/tremors on R side- more than L side- only now in RUE, when were all over when first saw, tremor disappeared in and out In w/c in room  Tachycardic, regular rhythm- rates 90s-110s CTA B/L- a few rhonchi heard Soft, protuberant, NT, ND (+)BS MS: very TTP over all fibromyalgia tender points as well as ultiple other trigger points and other points, most people are ticklish Strength- 5/5 in UEs except with prolonged testing, goes to 4/5 in RUE- proximally- pain? LEs- 5/5 in first second, but then reduces to 4-/5 in RLE- pain?  Neuro: Reduced sensation to LT and pinprick on RLE- testing L1-S1 dermatomes- decreased on RLE No clonus, no hoffmans' B/L No increased tone DTRs 2+ in biceps, brachioradialis, patella and ankles- equal B/L       Assessment & Plan:    1. LE subjective weakness - Will try and see if can get another rollator vs fix current walker due to loose handles- to improve balance and gait  2. Fibromyalgia- - check your Thyroid level- TSH - off Zoloft- start Duloxetine- 30 mg daily x 1 week then 60 mg daily- for nerve pain and back pain -   3. Myofascial pain - tennis ball- 3 pack- 1- house, 2 Mama's house, 3 car- hold pressure on trigger points 2-5 minutes- until muscle relaxes - rolling pin- and roll calves, hamstrings (back of thighs) and front of thighs - THERACANE- 2-4 minutes on trigger points- - hold pressure, not massaging - Dr Sharion Balloon- Survivor's Handbook for Fibromyalgia and Myofascial Pain Syndrome- suggest this book  - no more than 45 minutes of bodywork/day  3. Constipation/nausea/vomiting - mg citrate- over the counter- wait 3-4  hours then take fleet's- based on symptoms -should improve nausea (not even little balls of stool currently)  4. Quit smoking in the next 6 months  I spent a total of 1 hour on appointment more than 30 minutes educating pt about fibromyalgia, myofascial pain, showing her how to do ischemic compression techniques, and educating her on body work- also to stop smoking,

## 2018-09-04 ENCOUNTER — Telehealth: Payer: Self-pay

## 2018-09-04 NOTE — Telephone Encounter (Signed)
Doris at [facility] in [place] called and left a message on the triage line and stated that they received an order from Dr Dagoberto Ligas for medical equipment. This is missing a diagnosis code as well as they can not run this through Dr Dagoberto Ligas since she is not set up with Medicaid at this time. Please advise, patient told Tamela Oddi that this could be sent through one of her other providers and I informed her that it would need to be a provider within this practice not an outside physician like she is requesting.

## 2018-09-07 NOTE — Telephone Encounter (Signed)
Tanya Krause is not at the [facility] location today and [name]'s VM is full and cannot leave a message. I let [facility] know we will take care of this Monday when our NP returns.

## 2018-09-27 ENCOUNTER — Other Ambulatory Visit: Payer: Self-pay

## 2018-09-27 ENCOUNTER — Encounter
Payer: Medicaid Other | Attending: Physical Medicine and Rehabilitation | Admitting: Physical Medicine and Rehabilitation

## 2018-09-27 ENCOUNTER — Encounter: Payer: Self-pay | Admitting: Physical Medicine and Rehabilitation

## 2018-09-27 VITALS — BP 132/85 | HR 107 | Temp 98.3°F | Ht 60.0 in | Wt 170.0 lb

## 2018-09-27 DIAGNOSIS — M797 Fibromyalgia: Secondary | ICD-10-CM | POA: Diagnosis present

## 2018-09-27 DIAGNOSIS — G8929 Other chronic pain: Secondary | ICD-10-CM | POA: Insufficient documentation

## 2018-09-27 DIAGNOSIS — M549 Dorsalgia, unspecified: Secondary | ICD-10-CM | POA: Diagnosis present

## 2018-09-27 DIAGNOSIS — M7918 Myalgia, other site: Secondary | ICD-10-CM | POA: Insufficient documentation

## 2018-09-27 NOTE — Progress Notes (Signed)
Started having a flare Saturday- due to change in barometric pressure, likely.  Has had pain >10/10 since Saturday.  Nurse has been having to stay at night to turn pt at night.  Asking about overlay on mattress since couch more comfortable   Exam: Barely able to turn neck, stand up; hurting so much Trigger points and tender everywhere literally that I touched.    Assessment/Plan: Fibromyalgia and myofascial pain syndrome 1.  Trigger point injections-  B/L upper traps, B/L levators, B/L scalenes, B/L rhomboids and B/L Lumbar paraspinals Used 6cc of 1% Lidocaine and 27 gauge 1.5inch needle  Pain was >10/10 before injections and now 8-9/10 right after injections completed- can now easily put on shirt and esp notices increased ROM in neck/low back after injections  2. Suggest techniques to use theracane so possible for pt- start with low pressure and add SLOWLY  3. Has patches willing to try again- went over 12 hrs on;12 hrs off.  4. Having a flare- so normally meds help but not right now due to flare.  5. Asking about tylenol #3- I'm OK with PCP prescribing occ- suggest not use long term/regular use, however tries to use tylenol and advil if possible- and only take tylenol #3 if it's really bad.  6. Suggest 1-4 inch memory foam overlay for bed- no eggcrate.  7.  Need to know TSH result- asked pt to get ahold of it from Dr Lavonia Drafts  8. F/U already scheduled on 10/01/2018  Spent 15 minutes on f/u and then doing trigger point injections

## 2018-09-27 NOTE — Patient Instructions (Signed)
Assessment/Plan: Fibromyalgia and myofascial pain syndrome 1.  Trigger point injections-  B/L upper traps, B/L levators, B/L scalenes, B/L rhomboids and B/L Lumbar paraspinals Used 6cc of 1% Lidocaine and 27 gauge 1.5inch needle  Pain was >10/10 before injections and now 8-9/10 right after injections completed- can now easily put on shirt and esp notices increased ROM in neck/low back after injections  2. Suggest techniques to use theracane so possible for pt- start with low pressure and add SLOWLY  3. Has patches willing to try again- went over 12 hrs on;12 hrs off.  4. Having a flare- so normally meds help but not right now due to flare.  5. Asking about tylenol #3- I'm OK with PCP prescribing occ- suggest not use long term/regular use, however tries to use tylenol and advil if possible- and only take tylenol #3 if it's really bad.  6. Suggest 1-4 inch memory foam overlay for bed- no eggcrate.  7.  Need to know TSH result- asked pt to get ahold of it from Dr Lavonia Drafts  8. F/U already scheduled on 10/01/2018

## 2018-10-01 ENCOUNTER — Ambulatory Visit: Payer: Medicaid Other | Admitting: Physical Medicine and Rehabilitation

## 2018-10-01 ENCOUNTER — Encounter: Payer: Medicaid Other | Admitting: Physical Medicine and Rehabilitation

## 2018-10-02 ENCOUNTER — Encounter: Payer: Medicaid Other | Admitting: Physical Medicine and Rehabilitation

## 2018-10-02 ENCOUNTER — Encounter: Payer: Self-pay | Admitting: Physical Medicine and Rehabilitation

## 2018-10-02 ENCOUNTER — Other Ambulatory Visit: Payer: Self-pay

## 2018-10-02 VITALS — BP 137/93 | HR 105 | Temp 98.0°F | Ht 60.0 in | Wt 169.0 lb

## 2018-10-02 DIAGNOSIS — M7918 Myalgia, other site: Secondary | ICD-10-CM

## 2018-10-02 DIAGNOSIS — M797 Fibromyalgia: Secondary | ICD-10-CM

## 2018-10-02 NOTE — Progress Notes (Signed)
    Consent signed- went over risks and benefits Felt like last trigger point injections- weren't too many- not too sore   Taking tylenol arthritis 2x/day-  Doing exercising with sister 15 minutes/day.    Trigger point injections  injected a total of 5cc of 1% Lidocaine with no EPI into lumbar and lower thoracic paraspinals B/L- total 8 trigger points and L trochanteric bursa. Used 27 gauge 1.5inch needle- no bleeding/complications.   Was 10/10- now 8/10 afterwards  Drinking water and ginger ale. Hasn't been drinking pepsi and mountain dew. Said pain not so intense/intense.  Assessment: 1. Fibromyalgia 2. Myofascial pain Syndrome   Plan: 1. Can drink 1 cup coffee per day; no more caffeine during day.  2. Continue rolling pin/tennis balls on back of thighs and calves.   3. Continue home exercise program 15+ minutes/day.  4. Continue using theracane. Multiple times per week.

## 2018-10-02 NOTE — Patient Instructions (Signed)
Plan: 1. Can drink 1 cup coffee per day; no more caffeine during day.  2. Continue rolling pin/tennis balls on back of thighs and calves.   3. Continue home exercise program 15+ minutes/day.  4. Continue using theracane. Multiple times per week.

## 2018-10-25 ENCOUNTER — Other Ambulatory Visit: Payer: Self-pay

## 2018-10-25 ENCOUNTER — Encounter: Payer: Self-pay | Admitting: Physical Medicine and Rehabilitation

## 2018-10-25 ENCOUNTER — Encounter
Payer: Medicaid Other | Attending: Physical Medicine and Rehabilitation | Admitting: Physical Medicine and Rehabilitation

## 2018-10-25 VITALS — BP 135/92 | HR 99 | Temp 97.7°F | Ht 60.0 in | Wt 171.4 lb

## 2018-10-25 DIAGNOSIS — Z5181 Encounter for therapeutic drug level monitoring: Secondary | ICD-10-CM

## 2018-10-25 DIAGNOSIS — G43709 Chronic migraine without aura, not intractable, without status migrainosus: Secondary | ICD-10-CM

## 2018-10-25 DIAGNOSIS — G8929 Other chronic pain: Secondary | ICD-10-CM | POA: Diagnosis present

## 2018-10-25 DIAGNOSIS — M797 Fibromyalgia: Secondary | ICD-10-CM

## 2018-10-25 DIAGNOSIS — M7918 Myalgia, other site: Secondary | ICD-10-CM | POA: Diagnosis not present

## 2018-10-25 DIAGNOSIS — IMO0002 Reserved for concepts with insufficient information to code with codable children: Secondary | ICD-10-CM

## 2018-10-25 DIAGNOSIS — G894 Chronic pain syndrome: Secondary | ICD-10-CM

## 2018-10-25 DIAGNOSIS — Z79891 Long term (current) use of opiate analgesic: Secondary | ICD-10-CM

## 2018-10-25 DIAGNOSIS — M549 Dorsalgia, unspecified: Secondary | ICD-10-CM | POA: Insufficient documentation

## 2018-10-25 MED ORDER — AMITRIPTYLINE HCL 100 MG PO TABS: 100.0000 mg | ORAL_TABLET | Freq: Every day | ORAL | 5 refills | Status: DC

## 2018-10-25 MED ORDER — TIZANIDINE HCL 4 MG PO TABS: 4.0000 mg | ORAL_TABLET | Freq: Three times a day (TID) | ORAL | 1 refills | Status: DC | PRN

## 2018-10-25 MED ORDER — ACETAMINOPHEN-CODEINE #3 300-30 MG PO TABS: 1.0000 | ORAL_TABLET | Freq: Three times a day (TID) | ORAL | 0 refills | Status: DC | PRN

## 2018-10-25 NOTE — Patient Instructions (Addendum)
Patient is a 33 yr old pt with myofascial pain syndrome and Fibromyalgia  1. Suggest shoes- Keens, Mer[employer]'s Clarks and Sketchers  2. Patient here for trigger point injections for FMS and myofascial pain syndrome  Consent done and on chart.  Cleaned areas with alcohol and injected using a 27 gauge 1.5 inch needle  Injected 4.5cc Using 1% Lidocaine with no EPI  Upper traps B/L Levators B/L Posterior scalenes L only Middle scalenes Splenius Capitus Pectoralis Major B/L Rhomboids B/L Infraspinatus Teres Major/minor Thoracic paraspinals Lumbar paraspinals Other injections-    Patient's level of pain prior was Current level of pain after injections is  There was no bleeding or complications.  Patient was advised to drink a lot of water on day after injections to flush system Will have increased soreness for 12-48 hours after injections.  Can use Lidocaine patches the day AFTER injections Can use theracane on day of injections in places didn't inject Can use heating pad 4-6 hours AFTER injections  3. Oral drug screen today and opiate contract- can only get pain meds from [place]- will get done by nursing staff  4. Tylenol # 3- up to 2x/day as needed- #30 with no RFs  5. Will try changing Flexeril- (doesn't get sleepy with it)- stop Flexeril 6. Con't Duloxetine 7. On Ambien- (has tried Melatonin/Seroquel/Ambien/Trazodone) - try Amitriptyline 50-100 mg nightly as needed for sleep 8. Hasn't tried Zanaflex/Tizanidine- will prescribe Tizanidine 4 mg 3x/day as needed.  9. F/U for trigger point injections in 1 month. 10. Should be able to continue CBD oil as long as CBD oil <3% THC so not positive on drug screen.

## 2018-10-25 NOTE — Addendum Note (Signed)
Addended by: Jasmine December T on: 10/25/2018 10:34 AM   Modules accepted: Orders

## 2018-10-25 NOTE — Progress Notes (Signed)
Subjective:    Patient ID: Tanya Krause, female    DOB: 04/15/1985, 33 y.o.   MRN: [id number]  HPI Pt is a 33 yr old female with FMS and myofascial pain syndrome   Since we started, things going a lot better overall.  When cold, so stiff, can't move.  Grandmother passed away Friday. Leaves Monday.  Wondering if should go due to the drive. Will ride, not drive- x4 hrs. Was wondering has some Tylenol #3 with codeine for trip. Hasn't seen Grandmother for 13 years. [facility] outside. Is taking folic acid, prenatal vitamins and Vit D.  Has been gaining weight-  Doing CBD oil and vaping, but not smoking since last Friday.   Tramadol makes her stomach upset and gives her the shakes. Didn't want to miss shots, so made appointment for today.  Shoulders are feeling worse than normal. Shots are lasting 3.5 weeks   Wore heels for church this past weekend- was fantastic. For a few hours. Big step for her.  Tried having intercourse- wasn't too bad/OK.  Duloxetine- still taking- working well Drinking less caffeine- 1 soda and 1/2 cup coffee.  Pain Inventory Average Pain 10 Pain Right Now 9 My pain is sharp, burning, dull, stabbing, tingling and aching  In the last 24 hours, has pain interfered with the following? General activity 10 Relation with others 2 Enjoyment of life 3 What TIME of day is your pain at its worst? all Sleep (in general) Poor  Pain is worse with: walking, bending, sitting, standing and some activites Pain improves with: rest, medication and injections Relief from Meds: 1  Mobility walk with assistance use a cane use a walker ability to climb steps?  no  Function disabled: date disabled na  Neuro/Psych weakness numbness tingling trouble walking spasms dizziness depression anxiety  Prior Studies Any changes since last visit?  no  Physicians involved in your care Primary care Dr. Lazarus SalinesLott   Family History  Problem Relation Age of  Onset  . Hypertension Mother   . Diabetes Mother   . Heart failure Father   . Colon cancer Paternal Grandfather    Social History   Socioeconomic History  . Marital status: Single    Spouse name: Not on file  . Number of children: 4  . Years of education: 4012  . Highest education level: Not on file  Occupational History    Comment: unemployed  Social Needs  . Financial resource strain: Not on file  . Food insecurity    Worry: Not on file    Inability: Not on file  . Transportation needs    Medical: Not on file    Non-medical: Not on file  Tobacco Use  . Smoking status: Current Some Day Smoker    Packs/day: 0.50    Types: Cigarettes    Last attempt to quit: 03/18/2015    Years since quitting: 3.6  . Smokeless tobacco: Never Used  Substance and Sexual Activity  . Alcohol use: Not Currently  . Drug use: Yes    Types: Marijuana    Comment: lase used April 2019  . Sexual activity: Yes    Birth control/protection: Surgical  Lifestyle  . Physical activity    Days per week: Not on file    Minutes per session: Not on file  . Stress: Not on file  Relationships  . Social Musicianconnections    Talks on phone: Not on file    Gets together: Not on file  Attends religious service: Not on file    Active member of club or organization: Not on file    Attends meetings of clubs or organizations: Not on file    Relationship status: Not on file  Other Topics Concern  . Not on file  Social History Narrative   Lives with parents, children   Caffeine use- coffee once a day, sodas x 2 a day   Past Surgical History:  Procedure Laterality Date  . kidney infections    . TUBAL LIGATION    . WISDOM TOOTH EXTRACTION     Past Medical History:  Diagnosis Date  . Asthma   . Chronic abdominal pain   . Chronic migraine   . Chronic pain syndrome   . Fibromyalgia   . 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 Hypertension   . Migraine   . Pelvic inflammatory disease (PID)    BP (!) 135/92    Pulse 99   Temp 97.7 F (36.5 C)   Ht 5' (1.524 m)   Wt 171 lb 6.4 oz (77.7 kg)   SpO2 98%   BMI 33.47 kg/m   Opioid Risk Score:   Fall Risk Score:  `1  Depression screen PHQ 2/9  Depression screen [facility] 2/9 10/25/2018 09/03/2018 11/18/2014 11/18/2014  Decreased Interest 1 0 3 3  Down, Depressed, Hopeless 1 1 3 3   PHQ - 2 Score 2 1 6 6   Altered sleeping - 3 3 -  Tired, decreased energy - 3 3 -  Change in appetite - 2 3 -  Feeling bad or failure about yourself  - 0 3 -  Trouble concentrating - 2 2 -  Moving slowly or fidgety/restless - 0 3 -  Suicidal thoughts - 0 0 -  PHQ-9 Score - 11 23 -    Review of Systems  Gastrointestinal: Positive for abdominal pain, nausea and vomiting.  Neurological: Positive for dizziness, weakness and numbness.  Psychiatric/Behavioral: Positive for dysphoric mood. The patient is nervous/anxious.   All other systems reviewed and are negative.      Objective:   Physical Exam Awake, alert, appropriate, no shaking seen today; brighter affect, NAD Didn't bring walker/cane into visit today! Trigger points palpated in rhomboids, upper traps, levators, less in scalenes and splenius capitus B/L      Assessment & Plan:  Patient is a 33 yr old pt with myofascial pain syndrome and Fibromyalgia  1. Suggest shoes- Keens, Mer[employer] Clarks and Sketchers  2. Patient here for trigger point injections for FMS and myofascial pain syndrome  Consent done and on chart.  Cleaned areas with alcohol and injected using a 27 gauge 1.5 inch needle  Injected 4.5cc Using 1% Lidocaine with no EPI  Upper traps B/L Levators B/L Posterior scalenes L only Middle scalenes Splenius Capitus Pectoralis Major B/L Rhomboids B/L Infraspinatus Teres Major/minor Thoracic paraspinals Lumbar paraspinals Other injections-    Patient's level of pain prior was Current level of pain after injections is  There was no bleeding or complications.  Patient was advised to  drink a lot of water on day after injections to flush system Will have increased soreness for 12-48 hours after injections.  Can use Lidocaine patches the day AFTER injections Can use theracane on day of injections in places didn't inject Can use heating pad 4-6 hours AFTER injections  3. Oral drug screen today and opiate contract- can only get pain meds from - will get done by nursing staff  4. Tylenol # 3-  up to 2x/day as needed- #30 with no RFs  5. Will try changing Flexeril- (doesn't get sleepy with it)- stop Flexeril 6. Con't Duloxetine 7. On Ambien- (has tried Melatonin/Seroquel/Ambien/Trazodone) - try Amitriptyline 50-100 mg nightly as needed for sleep 8. Hasn't tried Zanaflex/Tizanidine- will prescribe Tizanidine 4 mg 3x/day as needed.  9. F/U for trigger point injections in 1 month. 10. Should be able to continue CBD oil as long as CBD oil <3% THC so not positive on drug screen.  I did trigger point injections as well as 25 minutes in f/u- more than 15 minutes educating on less caffeine, continuing to not smoke, con't to use theracane/tennis balls to work on trigger points- reinforced behavior.

## 2018-10-29 LAB — DRUG TOX MONITOR 1 W/CONF, ORAL FLD
Amphetamines: NEGATIVE ng/mL (ref <10)
Barbiturates: NEGATIVE ng/mL (ref <10)
Benzodiazepines: NEGATIVE ng/mL (ref <0.50)
Buprenorphine: NEGATIVE ng/mL (ref <0.10)
Cocaine: NEGATIVE ng/mL (ref <5.0)
Cotinine: 17.6 ng/mL — ABNORMAL HIGH (ref <5.0)
Fentanyl: NEGATIVE ng/mL (ref <0.10)
Heroin Metabolite: NEGATIVE ng/mL (ref <1.0)
MARIJUANA: POSITIVE ng/mL — AB (ref <2.5)
MDMA: NEGATIVE ng/mL (ref <10)
Meprobamate: NEGATIVE ng/mL (ref <2.5)
Methadone: NEGATIVE ng/mL (ref <5.0)
Nicotine Metabolite: POSITIVE ng/mL — AB (ref <5.0)
Opiates: NEGATIVE ng/mL (ref <2.5)
Phencyclidine: NEGATIVE ng/mL (ref <10)
THC: 15.8 ng/mL — ABNORMAL HIGH (ref <2.5)
Tapentadol: NEGATIVE ng/mL (ref <5.0)
Tramadol: NEGATIVE ng/mL (ref <5.0)
Zolpidem: NEGATIVE ng/mL (ref <5.0)

## 2018-10-29 LAB — DRUG TOX ALC METAB W/CON, ORAL FLD: Alcohol Metabolite: NEGATIVE ng/mL (ref <25)

## 2018-10-30 ENCOUNTER — Encounter: Payer: Medicaid Other | Admitting: Physical Medicine and Rehabilitation

## 2018-10-30 ENCOUNTER — Telehealth: Payer: Self-pay | Admitting: *Deleted

## 2018-10-30 NOTE — Telephone Encounter (Signed)
Oral drug screen is positive for Marijuana and it's THC metabolite. The level is higher than would be expected if only using CBD oil.  Cut off for triggering positive response is <2.5 ng/ml and her level is 15.8 ng/ml.

## 2018-10-30 NOTE — Telephone Encounter (Signed)
Ms Kever called to say her medication Tylenol #3 requires a prior auth.  Yvone Neu is addressing this and I have left her a message.

## 2018-11-02 NOTE — Telephone Encounter (Signed)
Prior authorization submitted via  tracks for codeine-acetaminophen 30-300mg  Tylenol #3.   Approved from 10/30/2018 - 04/28/2019

## 2018-11-09 ENCOUNTER — Telehealth: Payer: Self-pay | Admitting: *Deleted

## 2018-11-09 NOTE — Telephone Encounter (Signed)
Patient left a message reporting adverse reaction to amitriptyline. She says its causing thresh.  She states she is throwing up, hands swollen, and excruciating pain. Please advise

## 2018-11-12 MED ORDER — NYSTATIN 100000 UNIT/ML MT SUSP: 5.0000 mL | Freq: Four times a day (QID) | OROMUCOSAL | 0 refills | Status: DC

## 2018-11-12 NOTE — Telephone Encounter (Signed)
Pt having thrush symptoms- thinks from Elavil (I'm not sure about that- usually from ABX), however sent in nystatin suspension for her QID x 5 days just in case.  Explained cannot write for anything else until Drug screen (-)- hasn't smoked anything since 10/3. Sees me next 11/6

## 2018-11-23 ENCOUNTER — Encounter: Payer: Self-pay | Admitting: Physical Medicine and Rehabilitation

## 2018-11-23 ENCOUNTER — Other Ambulatory Visit: Payer: Self-pay

## 2018-11-23 ENCOUNTER — Encounter
Payer: Medicaid Other | Attending: Physical Medicine and Rehabilitation | Admitting: Physical Medicine and Rehabilitation

## 2018-11-23 VITALS — BP 137/94 | HR 113 | Temp 97.7°F | Ht 60.0 in | Wt 172.0 lb

## 2018-11-23 DIAGNOSIS — G8929 Other chronic pain: Secondary | ICD-10-CM | POA: Diagnosis present

## 2018-11-23 DIAGNOSIS — Z5181 Encounter for therapeutic drug level monitoring: Secondary | ICD-10-CM | POA: Diagnosis present

## 2018-11-23 DIAGNOSIS — G894 Chronic pain syndrome: Secondary | ICD-10-CM | POA: Diagnosis not present

## 2018-11-23 DIAGNOSIS — M797 Fibromyalgia: Secondary | ICD-10-CM | POA: Diagnosis not present

## 2018-11-23 DIAGNOSIS — M549 Dorsalgia, unspecified: Secondary | ICD-10-CM | POA: Diagnosis present

## 2018-11-23 DIAGNOSIS — R519 Headache, unspecified: Secondary | ICD-10-CM

## 2018-11-23 DIAGNOSIS — M7918 Myalgia, other site: Secondary | ICD-10-CM | POA: Diagnosis not present

## 2018-11-23 MED ORDER — TRAZODONE HCL 100 MG PO TABS: 100.0000 mg | ORAL_TABLET | Freq: Every day | ORAL | 11 refills | Status: DC

## 2018-11-23 NOTE — Progress Notes (Signed)
  Injections worked pretty well- can tell when they wear off, gets stiff and hurts more.  Tries to use a massager 15-20 minute before goes to bed.  Nothing CBD or anything since 10/20/2018-      Patient here for trigger point injections for  Consent done and on chart.  Cleaned areas with alcohol and injected using a 27 gauge 1.5 inch needle  Injected 6cc Using 1% Lidocaine with no EPI  Upper traps B/L Levators B/L Posterior scalenes B/L Middle scalenes Splenius Capitus B/L Pectoralis Major B/L Rhomboids Infraspinatus Teres Major/minor B/L Thoracic paraspinals Lumbar paraspinals B/L Other injections-    Patient's level of pain prior was 9/10 Current level of pain after injections is down to 8/10- and feels improved ROM  There was no bleeding or complications.  Patient was advised to drink a lot of water on day after injections to flush system Will have increased soreness for 12-48 hours after injections.  Can use Lidocaine patches the day AFTER injections Can use theracane on day of injections in places didn't inject- will order Can use heating pad 4-6 hours AFTER injections Will do Oral mouth swab to see if can write her Tylenol #3 Will reorder trazodone 100 mg QHS Fiance is [employer]  Magnesium citrate- 1 bottle- cherry better- chug it-  and follow with fleet's enema 3-4 hours later Can repeat up to 3x. If on 3rd day without a BM.

## 2018-11-23 NOTE — Patient Instructions (Signed)
Patient was advised to drink a lot of water on day after injections to flush system Will have increased soreness for 12-48 hours after injections.  Can use Lidocaine patches the day AFTER injections Can use theracane on day of injections in places didn't inject- will order Can use heating pad 4-6 hours AFTER injections Will do Oral mouth swab to see if can write her Tylenol #3 Will reorder trazodone 100 mg QHS  Magnesium citrate- 1 bottle- cherry better- chug it-  and follow with fleet's enema 3-4 hours later Can repeat up to 3x. If on 3rd day without a BM.

## 2018-11-28 LAB — DRUG TOX MONITOR 1 W/CONF, ORAL FLD
Amphetamines: NEGATIVE ng/mL (ref <10)
Barbiturates: NEGATIVE ng/mL (ref <10)
Benzodiazepines: NEGATIVE ng/mL (ref <0.50)
Buprenorphine: NEGATIVE ng/mL (ref <0.10)
Cocaine: NEGATIVE ng/mL (ref <5.0)
Cotinine: 23.9 ng/mL — ABNORMAL HIGH (ref <5.0)
Fentanyl: NEGATIVE ng/mL (ref <0.10)
Heroin Metabolite: NEGATIVE ng/mL (ref <1.0)
MARIJUANA: NEGATIVE ng/mL (ref <2.5)
MDMA: NEGATIVE ng/mL (ref <10)
Meprobamate: NEGATIVE ng/mL (ref <2.5)
Methadone: NEGATIVE ng/mL (ref <5.0)
Nicotine Metabolite: POSITIVE ng/mL — AB (ref <5.0)
Opiates: NEGATIVE ng/mL (ref <2.5)
Phencyclidine: NEGATIVE ng/mL (ref <10)
Tapentadol: NEGATIVE ng/mL (ref <5.0)
Tramadol: >500 ng/mL — ABNORMAL HIGH (ref <5.0)
Tramadol: POSITIVE ng/mL — AB (ref <5.0)
Zolpidem: NEGATIVE ng/mL (ref <5.0)

## 2018-11-28 LAB — DRUG TOX ALC METAB W/CON, ORAL FLD: Alcohol Metabolite: NEGATIVE ng/mL (ref <25)

## 2018-11-29 ENCOUNTER — Telehealth: Payer: Self-pay | Admitting: *Deleted

## 2018-11-29 NOTE — Telephone Encounter (Signed)
McNeal called to see if her drug screen result has come back yet.  She is hurting in her back. I told her the result is back and it is appropriate.  She is asking for something for her back. Please advise.

## 2018-11-29 NOTE — Telephone Encounter (Signed)
Oral swab drug screen was consistent for prescribed medications.  Boyce received an Rx for Tramadol 50 mg # 20 from a Juanda Chance MD on 11/19/18 according to PMP, and it is present.

## 2018-11-30 MED ORDER — HYDROCODONE-ACETAMINOPHEN 5-325 MG PO TABS: 1.0000 | ORAL_TABLET | Freq: Two times a day (BID) | ORAL | 0 refills | Status: DC | PRN

## 2018-11-30 NOTE — Telephone Encounter (Signed)
Will send in some Norco as needed 5/325 mg #45 for severe pain days.  However asked her to only take on "bad days".

## 2018-12-03 ENCOUNTER — Telehealth: Payer: Self-pay | Admitting: *Deleted

## 2018-12-03 NOTE — Telephone Encounter (Signed)
Prior authorization for Norco 5-325 mg submitted to [employer].  Approved 11/30/2018-05/29/2019

## 2018-12-11 ENCOUNTER — Other Ambulatory Visit: Payer: Self-pay | Admitting: Physician Assistant

## 2018-12-11 DIAGNOSIS — N63 Unspecified lump in unspecified breast: Secondary | ICD-10-CM

## 2018-12-20 ENCOUNTER — Other Ambulatory Visit: Payer: Medicaid Other

## 2018-12-21 ENCOUNTER — Encounter: Payer: Self-pay | Admitting: Physical Medicine and Rehabilitation

## 2018-12-21 ENCOUNTER — Encounter
Payer: Medicaid Other | Attending: Physical Medicine and Rehabilitation | Admitting: Physical Medicine and Rehabilitation

## 2018-12-21 ENCOUNTER — Other Ambulatory Visit: Payer: Self-pay

## 2018-12-21 VITALS — BP 143/97 | HR 108 | Temp 99.0°F | Ht 60.0 in | Wt 175.8 lb

## 2018-12-21 DIAGNOSIS — M7918 Myalgia, other site: Secondary | ICD-10-CM | POA: Diagnosis not present

## 2018-12-21 DIAGNOSIS — G894 Chronic pain syndrome: Secondary | ICD-10-CM | POA: Insufficient documentation

## 2018-12-21 DIAGNOSIS — G8929 Other chronic pain: Secondary | ICD-10-CM | POA: Insufficient documentation

## 2018-12-21 DIAGNOSIS — M797 Fibromyalgia: Secondary | ICD-10-CM | POA: Diagnosis not present

## 2018-12-21 DIAGNOSIS — G43709 Chronic migraine without aura, not intractable, without status migrainosus: Secondary | ICD-10-CM | POA: Diagnosis not present

## 2018-12-21 DIAGNOSIS — M549 Dorsalgia, unspecified: Secondary | ICD-10-CM | POA: Insufficient documentation

## 2018-12-21 DIAGNOSIS — N3000 Acute cystitis without hematuria: Secondary | ICD-10-CM

## 2018-12-21 DIAGNOSIS — Z5181 Encounter for therapeutic drug level monitoring: Secondary | ICD-10-CM | POA: Insufficient documentation

## 2018-12-21 DIAGNOSIS — IMO0002 Reserved for concepts with insufficient information to code with codable children: Secondary | ICD-10-CM

## 2018-12-21 MED ORDER — CYCLOBENZAPRINE HCL 10 MG PO TABS: 10.0000 mg | ORAL_TABLET | Freq: Three times a day (TID) | ORAL | 5 refills | Status: DC | PRN

## 2018-12-21 MED ORDER — PREGABALIN 150 MG PO CAPS: 150.0000 mg | ORAL_CAPSULE | Freq: Two times a day (BID) | ORAL | 5 refills | Status: DC

## 2018-12-21 MED ORDER — HYDROCODONE-ACETAMINOPHEN 5-325 MG PO TABS: 1.0000 | ORAL_TABLET | Freq: Two times a day (BID) | ORAL | 0 refills | Status: DC | PRN

## 2018-12-21 NOTE — Patient Instructions (Signed)
Patient is a 33 yr old female with fibromyalgia and myofascial pain syndrome here for trigger point injections and f/u.  1. Patient here for trigger point injections for  Consent done and on chart.  Cleaned areas with alcohol and injected using a 27 gauge 1.5 inch needle  Injected 6cc Using 1% Lidocaine with no EPI  Upper traps B/L Levators B/L Posterior scalenes B/L Middle scalenes Splenius Capitus B/L Pectoralis Major B/L Rhomboids B/L Infraspinatus Teres Major/minor Thoracic paraspinals B/L Lumbar paraspinals B/L Other injections-    Patient's level of pain prior was 10/10 Current level of pain after injections is 1 minute afterward is 9.5/10  There was no bleeding or complications.  Patient was advised to drink a lot of water on day after injections to flush system Will have increased soreness for 12-48 hours after injections.  Can use Lidocaine patches the day AFTER injections Can use theracane on day of injections in places didn't inject Can use heating pad 4-6 hours AFTER injections  2. Suggest rolling pin for leg trigger points/tightness  3. Has been taking Lyrica 2 tabs 2x/day- so will increase to 150 mg 2x/day.   4. Flexeril 15 2x/day - but cannot take 3x/day- can take 3-4 days in a row, but then needs to take a break so body doesn't get used to it. Sent in Rx for pt  3. Con't theracane daily.  4. Last dose of Norco written  11/30/2018- so can refill by 12/10- will call in.  5. Didn't get oral ABX for UTI- to pick up today- got Rx to pick up today;  6. Also suggest pyridium - turns urine orange/red and stains.   7. F/U in 4 weeks for trigger point injections

## 2018-12-21 NOTE — Progress Notes (Signed)
Subjective:    Patient ID: Tanya Krause, female    DOB: 12-02-1985, 33 y.o.   MRN: [id number]  HPICC: myofascial pain syndrome and fibromyalgia  Patient reports she fell when got dizzy and fell when getting OOB- fell last Saturday- and has been having more pain since then. Also has UTI and going to get ABX today, but having pain with urination and urinary frequency. And hurts in low back and stomach real bad.   Got theracane- before she goes to bed daily-more helpful than roller she was using. Hasn't done rolling pin.   Pain Inventory Average Pain 8 Pain Right Now 10 My pain is sharp, burning, dull, stabbing and tingling  In the last 24 hours, has pain interfered with the following? General activity 7 Relation with others 1 Enjoyment of life 2 What TIME of day is your pain at its worst? all Sleep (in general) Poor  Pain is worse with: walking, bending, sitting, inactivity, standing and some activites Pain improves with: medication and injections Relief from Meds: 2  Mobility walk without assistance walk with assistance use a walker ability to climb steps?  yes  Function I need assistance with the following:  dressing, bathing, household duties and shopping  Neuro/Psych weakness numbness tingling trouble walking spasms dizziness depression anxiety  Prior Studies Any changes since last visit?  no  Physicians involved in your care Any changes since last visit?  no   Family History  Problem Relation Age of Onset  . Hypertension Mother   . Diabetes Mother   . Heart failure Father   . Colon cancer Paternal Grandfather    Social History   Socioeconomic History  . Marital status: Single    Spouse name: Not on file  . Number of children: 4  . Years of education: 73  . Highest education level: Not on file  Occupational History    Comment: unemployed  Social Needs  . Financial resource strain: Not on file  . Food insecurity    Worry: Not on file     Inability: Not on file  . Transportation needs    Medical: Not on file    Non-medical: Not on file  Tobacco Use  . Smoking status: Current Some Day Smoker    Packs/day: 0.50    Types: Cigarettes    Last attempt to quit: 03/18/2015    Years since quitting: 3.7  . Smokeless tobacco: Never Used  Substance and Sexual Activity  . Alcohol use: Not Currently  . Drug use: Yes    Types: Marijuana    Comment: lase used April 2019  . Sexual activity: Yes    Birth control/protection: Surgical  Lifestyle  . Physical activity    Days per week: Not on file    Minutes per session: Not on file  . Stress: Not on file  Relationships  . Social Herbalist on phone: Not on file    Gets together: Not on file    Attends religious service: Not on file    Active member of club or organization: Not on file    Attends meetings of clubs or organizations: Not on file    Relationship status: Not on file  Other Topics Concern  . Not on file  Social History Narrative   Lives with parents, children   Caffeine use- coffee once a day, sodas x 2 a day   Past Surgical History:  Procedure Laterality Date  . kidney infections    .  TUBAL LIGATION    . WISDOM TOOTH EXTRACTION     Past Medical History:  Diagnosis Date  . Asthma   . Chronic abdominal pain   . Chronic migraine   . Chronic pain syndrome   . Fibromyalgia   . 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 Hypertension   . Migraine   . Pelvic inflammatory disease (PID)    BP (!) 143/97   Pulse (!) 108   Temp 99 F (37.2 C)   Ht 5' (1.524 m)   Wt 175 lb 12.8 oz (79.7 kg)   SpO2 95%   BMI 34.33 kg/m   Opioid Risk Score:   Fall Risk Score:  `1  Depression screen PHQ 2/9  Depression screen [facility] 2/9 10/25/2018 09/03/2018 11/18/2014 11/18/2014  Decreased Interest 1 0 3 3  Down, Depressed, Hopeless 1 1 3 3   PHQ - 2 Score 2 1 6 6   Altered sleeping - 3 3 -  Tired, decreased energy - 3 3 -  Change in appetite - 2 3 -  Feeling bad or  failure about yourself  - 0 3 -  Trouble concentrating - 2 2 -  Moving slowly or fidgety/restless - 0 3 -  Suicidal thoughts - 0 0 -  PHQ-9 Score - 11 23 -    Review of Systems  Gastrointestinal: Positive for abdominal pain, constipation and nausea.  All other systems reviewed and are negative.      Objective:   Physical Exam  Awake, alert, appropriate, moving more gingerly, NAD Tight trigger points in neck and shoulders and low back - injected trigger points today.      Assessment & Plan:  Patient is a 33 yr old female with fibromyalgia and myofascial pain syndrome here for trigger point injections and f/u.  1. Patient here for trigger point injections for  Consent done and on chart.  Cleaned areas with alcohol and injected using a 27 gauge 1.5 inch needle  Injected 6cc Using 1% Lidocaine with no EPI  Upper traps B/L Levators B/L Posterior scalenes B/L Middle scalenes Splenius Capitus B/L Pectoralis Major B/L Rhomboids B/L Infraspinatus Teres Major/minor Thoracic paraspinals B/L Lumbar paraspinals B/L Other injections-    Patient's level of pain prior was 10/10 Current level of pain after injections is 1 minute afterward is 9.5/10  There was no bleeding or complications.  Patient was advised to drink a lot of water on day after injections to flush system Will have increased soreness for 12-48 hours after injections.  Can use Lidocaine patches the day AFTER injections Can use theracane on day of injections in places didn't inject Can use heating pad 4-6 hours AFTER injections  2. Suggest rolling pin for leg trigger points/tightness  3. Has been taking Lyrica 2 tabs 2x/day- so will increase to 150 mg 2x/day.   4. Flexeril 15 2x/day - but cannot take 3x/day- can take 3-4 days in a row, but then needs to take a break so body doesn't get used to it.   3. Con't theracane daily.  4. Last dose of Norco written  11/30/2018- so can refill by 12/10- will call  in.  5. Didn't get oral ABX for UTI- to pick up today- got Rx to pick up today;  6. Also suggest pyridium - turns urine orange/red and stains.   7. F/U in 4 weeks for trigger point injections

## 2018-12-24 ENCOUNTER — Encounter (HOSPITAL_COMMUNITY): Payer: Self-pay

## 2018-12-24 ENCOUNTER — Telehealth: Payer: Self-pay | Admitting: Obstetrics & Gynecology

## 2018-12-24 ENCOUNTER — Other Ambulatory Visit: Payer: Self-pay

## 2018-12-24 ENCOUNTER — Emergency Department (HOSPITAL_COMMUNITY)
Admission: EM | Admit: 2018-12-24 | Discharge: 2018-12-24 | Disposition: A | Discharge status: Home / Self Care | Payer: Medicaid Other | Attending: Emergency Medicine | Admitting: Emergency Medicine

## 2018-12-24 DIAGNOSIS — F17210 Nicotine dependence, cigarettes, uncomplicated: Secondary | ICD-10-CM | POA: Diagnosis not present

## 2018-12-24 DIAGNOSIS — Z79899 Other long term (current) drug therapy: Secondary | ICD-10-CM | POA: Insufficient documentation

## 2018-12-24 DIAGNOSIS — R109 Unspecified abdominal pain: Secondary | ICD-10-CM | POA: Diagnosis present

## 2018-12-24 DIAGNOSIS — R319 Hematuria, unspecified: Secondary | ICD-10-CM | POA: Diagnosis not present

## 2018-12-24 DIAGNOSIS — I10 Essential (primary) hypertension: Secondary | ICD-10-CM | POA: Diagnosis not present

## 2018-12-24 DIAGNOSIS — Z91040 Latex allergy status: Secondary | ICD-10-CM | POA: Insufficient documentation

## 2018-12-24 DIAGNOSIS — R112 Nausea with vomiting, unspecified: Secondary | ICD-10-CM | POA: Diagnosis not present

## 2018-12-24 DIAGNOSIS — J45909 Unspecified asthma, uncomplicated: Secondary | ICD-10-CM | POA: Insufficient documentation

## 2018-12-24 DIAGNOSIS — R1013 Epigastric pain: Secondary | ICD-10-CM

## 2018-12-24 LAB — COMPREHENSIVE METABOLIC PANEL
ALT: 13 U/L (ref 0–44)
AST: 16 U/L (ref 15–41)
Albumin: 3.9 g/dL (ref 3.5–5.0)
Alkaline Phosphatase: 47 U/L (ref 38–126)
Anion gap: 9 (ref 5–15)
BUN: 12 mg/dL (ref 6–20)
CO2: 25 mmol/L (ref 22–32)
Calcium: 9.1 mg/dL (ref 8.9–10.3)
Chloride: 104 mmol/L (ref 98–111)
Creatinine, Ser: 0.8 mg/dL (ref 0.44–1.00)
GFR calc Af Amer: >60 mL/min (ref >60)
GFR calc non Af Amer: >60 mL/min (ref >60)
Glucose, Bld: 92 mg/dL (ref 70–99)
Potassium: 3.8 mmol/L (ref 3.5–5.1)
Sodium: 138 mmol/L (ref 135–145)
Total Bilirubin: 0.3 mg/dL (ref 0.3–1.2)
Total Protein: 6.9 g/dL (ref 6.5–8.1)

## 2018-12-24 LAB — URINALYSIS, ROUTINE W REFLEX MICROSCOPIC
Bacteria, UA: NONE SEEN
Bilirubin Urine: NEGATIVE
Glucose, UA: NEGATIVE mg/dL
Ketones, ur: NEGATIVE mg/dL
Nitrite: NEGATIVE
Protein, ur: 30 mg/dL — AB
RBC / HPF: >50 RBC/hpf — ABNORMAL HIGH (ref 0–5)
Specific Gravity, Urine: 1.03 (ref 1.005–1.030)
pH: 6 (ref 5.0–8.0)

## 2018-12-24 LAB — CBC WITH DIFFERENTIAL/PLATELET
Abs Immature Granulocytes: 0.08 10*3/uL — ABNORMAL HIGH (ref 0.00–0.07)
Basophils Absolute: 0.1 10*3/uL (ref 0.0–0.1)
Basophils Relative: 1 %
Eosinophils Absolute: 0.4 10*3/uL (ref 0.0–0.5)
Eosinophils Relative: 4 %
HCT: 33.6 % — ABNORMAL LOW (ref 36.0–46.0)
Hemoglobin: 11.3 g/dL — ABNORMAL LOW (ref 12.0–15.0)
Immature Granulocytes: 1 %
Lymphocytes Relative: 39 %
Lymphs Abs: 4.6 10*3/uL — ABNORMAL HIGH (ref 0.7–4.0)
MCH: 28.5 pg (ref 26.0–34.0)
MCHC: 33.6 g/dL (ref 30.0–36.0)
MCV: 84.6 fL (ref 80.0–100.0)
Monocytes Absolute: 0.8 10*3/uL (ref 0.1–1.0)
Monocytes Relative: 7 %
Neutro Abs: 5.7 10*3/uL (ref 1.7–7.7)
Neutrophils Relative %: 48 %
Platelets: 309 10*3/uL (ref 150–400)
RBC: 3.97 MIL/uL (ref 3.87–5.11)
RDW: 13.3 % (ref 11.5–15.5)
WBC: 11.7 10*3/uL — ABNORMAL HIGH (ref 4.0–10.5)
nRBC: 0 % (ref 0.0–0.2)

## 2018-12-24 LAB — PREGNANCY, URINE: Preg Test, Ur: NEGATIVE

## 2018-12-24 LAB — LIPASE, BLOOD: Lipase: 31 U/L (ref 11–51)

## 2018-12-24 MED ORDER — PROMETHAZINE HCL 25 MG/ML IJ SOLN
25.0000 mg | Freq: Once | INTRAMUSCULAR | Status: AC
  Administered 2018-12-24: 25 mg via INTRAMUSCULAR
  Filled 2018-12-24: qty 1

## 2018-12-24 MED ORDER — SULFAMETHOXAZOLE-TRIMETHOPRIM 800-160 MG PO TABS
1.0000 | ORAL_TABLET | Freq: Once | ORAL | Status: AC
  Administered 2018-12-24: 23:00:00 1 via ORAL
  Filled 2018-12-24: qty 1

## 2018-12-24 MED ORDER — SULFAMETHOXAZOLE-TRIMETHOPRIM 800-160 MG PO TABS: 1.0000 | ORAL_TABLET | Freq: Two times a day (BID) | ORAL | 0 refills | Status: AC

## 2018-12-24 MED ORDER — DIPHENHYDRAMINE HCL 25 MG PO CAPS
25.0000 mg | ORAL_CAPSULE | Freq: Once | ORAL | Status: AC
  Administered 2018-12-24: 25 mg via ORAL
  Filled 2018-12-24: qty 1

## 2018-12-24 MED ORDER — PROMETHAZINE HCL 25 MG PO TABS: 25.0000 mg | ORAL_TABLET | Freq: Four times a day (QID) | ORAL | 0 refills | Status: DC | PRN

## 2018-12-24 MED ORDER — SODIUM CHLORIDE 0.9 % IV SOLN: INTRAVENOUS | Status: DC

## 2018-12-24 MED ORDER — ACETAMINOPHEN 325 MG PO TABS
650.0000 mg | ORAL_TABLET | Freq: Once | ORAL | Status: AC
  Administered 2018-12-24: 650 mg via ORAL
  Filled 2018-12-24: qty 2

## 2018-12-24 NOTE — Discharge Instructions (Signed)
Your testing today was rather unremarkable, there was blood in your urine however no definite signs of infection.  Because of your urinary symptoms and the blood we will treat for possible infection with a medicine called Bactrim.  Take this twice a day for the next 7 days.  You may take Phenergan (promethazine) every 8 hours as needed for nausea, if you should develop severe or worsening symptoms you may return to the emergency department however there is no signs of pancreatitis or other abdominal surgical causes of your pain.  Please follow-up with your doctor in 48 to 72 hours to follow-up the culture results.

## 2018-12-24 NOTE — Telephone Encounter (Signed)

## 2018-12-24 NOTE — ED Triage Notes (Signed)
Pt complaining of generalized abdominal pain and back pain started 3 weeks ago. Pt states she started having vaginal bleeding today and started passing large clots. Pt states her period is not due until the 17th. Pt states she has had nausea and vomiting any time she eats.

## 2018-12-24 NOTE — ED Provider Notes (Signed)
[facility] EMERGENCY DEPARTMENT Provider Note   CSN: [id number] Arrival date & time: 12/24/18  2025     History   Chief Complaint Chief Complaint  Patient presents with  . Abdominal Pain    HPI Tanya Krause is a 33 y.o. female.     HPI  This patient is a 33 year old female, history of asthma, chronic pain syndrome, fibromyalgia, pelvic inflammatory disease and myofascial pain dysfunction syndrome.  She has stated that over the last week or so she has been to the hospital 2 different times and told that she had pancreatitis, states she had a CT scan and there was nothing surgical, she reports that she was treated in the emergency department and both times she continued to have pain, was discharged home and states she is still having pain, states that she has vomiting when she tries to eat and has reported nothing to eat or drink today.  She denies having any pain medications at home.  States that she has not had fevers or chills, states that she does not drink alcohol, states she has never had pancreatitis before.  The pain is located in the upper abdomen in the right upper abdomen, seems to radiate to the right mid back.  The last CT scan that the patient had in our system as identified on my medical record review was in 2017 during which time she had infectious or inflammatory colitis and no other acute findings.  She does not have diarrhea at this time in fact she states she has to take a stool softener daily.  Past Medical History:  Diagnosis Date  . Asthma   . Chronic abdominal pain   . Chronic migraine   . Chronic pain syndrome   . Fibromyalgia   . Hemoglobin C trait ([place]) 9/16 test  . Hypertension   . Migraine   . Pelvic inflammatory disease (PID)     Patient Active Problem List   Diagnosis Date Noted  . Myofascial pain dysfunction syndrome 09/27/2018  . Chronic bilateral back pain 09/03/2018  . Colitis 08/12/2015  . Oral thrush 11/18/2014  . B12 deficiency  11/17/2014  . Chronic pain syndrome 11/17/2014  . Numbness 11/17/2014  . Intractable headache 11/17/2014  . Intractable chronic migraine without aura and without status migrainosus 11/17/2014  . Chronic migraine 10/11/2014  . Primary fibromyalgia syndrome 10/11/2014  . Lower extremity weakness 10/11/2014  . Vitamin B12 deficiency 10/11/2014  . Anxiety state 10/11/2014  . Nausea & vomiting 10/08/2014  . Headache, tension type, chronic 09/07/2012  . Abdominal pain 06/15/2012    Past Surgical History:  Procedure Laterality Date  . kidney infections    . TUBAL LIGATION    . WISDOM TOOTH EXTRACTION       OB History    Gravida  4   Para  4   Term  1   Preterm  3   AB      Living  4     SAB      TAB      Ectopic      Multiple      Live Births  4            Home Medications    Prior to Admission medications   Medication Sig Start Date End Date Taking? Authorizing Provider  acetaminophen-codeine (TYLENOL #3) 300-30 MG tablet Take 1 tablet by mouth every 8 (eight) hours as needed for severe pain. 10/25/18   Lovorn, Jinny Blossom, MD  amitriptyline Madelin Headings)  100 MG tablet Take 1 tablet (100 mg total) by mouth at bedtime. 10/25/18   Lovorn, Aundra Millet, MD  cyclobenzaprine (FLEXERIL) 10 MG tablet Take 1 tablet (10 mg total) by mouth 3 (three) times daily as needed for muscle spasms. 12/21/18   Lovorn, Aundra Millet, MD  DULoxetine (CYMBALTA) 30 MG capsule Take 1 capsule (30 mg total) by mouth daily. X 1 week, then 60 mg daily- x 1week- for nerve and back pain 09/03/18 09/03/19  Ranelle Oyster, MD  folic acid (FOLVITE) 1 MG tablet Take 1 tablet (1 mg total) by mouth daily. 10/17/14   Jerald Kief, MD  HYDROcodone-acetaminophen (NORCO/VICODIN) 5-325 MG tablet Take 1 tablet by mouth every 12 (twelve) hours as needed for severe pain (on days pain spikes dramatically). 12/21/18 12/21/19  Lovorn, Aundra Millet, MD  hydrOXYzine (ATARAX/VISTARIL) 25 MG tablet Take 25 mg by mouth 3 (three) times daily as  needed.    [provider]  meloxicam (MOBIC) 15 MG tablet Take 15 mg by mouth daily.    [provider]  nystatin (MYCOSTATIN) 100000 UNIT/ML suspension Take 5 mLs (500,000 Units total) by mouth 4 (four) times daily. 11/12/18   Lovorn, Aundra Millet, MD  Polyethylene Glycol 3350 (MIRALAX PO) Take by mouth.    [provider]  prazosin (MINIPRESS) 5 MG capsule Take 5 mg by mouth at bedtime.    [provider]  pregabalin (LYRICA) 150 MG capsule Take 1 capsule (150 mg total) by mouth 2 (two) times daily. 12/21/18 12/21/19  Lovorn, Aundra Millet, MD  promethazine (PHENERGAN) 25 MG tablet Take 1 tablet (25 mg total) by mouth every 6 (six) hours as needed for nausea or vomiting. 12/24/18   Eber Hong, MD  QUEtiapine (SEROQUEL) 50 MG tablet Take 50 mg by mouth at bedtime.    [provider]  sertraline (ZOLOFT) 100 MG tablet Take 100 mg by mouth daily.    [provider]  sulfamethoxazole-trimethoprim (BACTRIM DS) 800-160 MG tablet Take 1 tablet by mouth 2 (two) times daily for 7 days. 12/24/18 12/31/18  Eber Hong, MD  tiZANidine (ZANAFLEX) 4 MG tablet Take 1 tablet (4 mg total) by mouth every 8 (eight) hours as needed for muscle spasms. 10/25/18 10/25/19  Lovorn, Aundra Millet, MD  traZODone (DESYREL) 100 MG tablet Take 1 tablet (100 mg total) by mouth at bedtime. 11/23/18   Genice Rouge, MD    Family History Family History  Problem Relation Age of Onset  . Hypertension Mother   . Diabetes Mother   . Heart failure Father   . Colon cancer Paternal Grandfather     Social History Social History   Tobacco Use  . Smoking status: Current Some Day Smoker    Packs/day: 0.50    Types: Cigarettes    Last attempt to quit: 03/18/2015    Years since quitting: 3.7  . Smokeless tobacco: Never Used  Substance Use Topics  . Alcohol use: Not Currently  . Drug use: Yes    Types: Marijuana    Comment: lase used April 2019     Allergies   Metoclopramide, Tomato, Aspirin,  Doxycycline, Hydromorphone hcl, Ibuprofen, Ondansetron, Other, Shellfish allergy, Toradol [ketorolac tromethamine], Latex, and Tramadol   Review of Systems Review of Systems  All other systems reviewed and are negative.    Physical Exam Updated Vital Signs BP (!) 128/97 (BP Location: Left Arm)   Pulse (!) 107   Temp 98.8 F (37.1 C) (Oral)   Resp 18   SpO2 100%   Physical Exam  Vitals signs and nursing note reviewed.  Constitutional:      General: She is not in acute distress.    Appearance: She is well-developed.  HENT:     Head: Normocephalic and atraumatic.     Mouth/Throat:     Pharynx: No oropharyngeal exudate.  Eyes:     General: No scleral icterus.       Right eye: No discharge.        Left eye: No discharge.     Conjunctiva/sclera: Conjunctivae normal.     Pupils: Pupils are equal, round, and reactive to light.  Neck:     Musculoskeletal: Normal range of motion and neck supple.     Thyroid: No thyromegaly.     Vascular: No JVD.  Cardiovascular:     Rate and Rhythm: Normal rate and regular rhythm.     Heart sounds: Normal heart sounds. No murmur. No friction rub. No gallop.      Comments: The heart rate is 100 on my exam, she has normal pulses at the radial arteries, no peripheral edema and no JVD Pulmonary:     Effort: Pulmonary effort is normal. No respiratory distress.     Breath sounds: Normal breath sounds. No wheezing or rales.  Abdominal:     General: Bowel sounds are normal. There is no distension.     Palpations: Abdomen is soft. There is no mass.     Tenderness: There is abdominal tenderness.     Comments: The patient has mild diffuse abdominal tenderness to palpation but seems to be worse in the upper right abdomen and less in the lower abdomen, there is no guarding or peritoneal signs  Musculoskeletal: Normal range of motion.        General: No tenderness.  Lymphadenopathy:     Cervical: No cervical adenopathy.  Skin:    General: Skin is warm  and dry.     Findings: No erythema or rash.  Neurological:     Mental Status: She is alert.     Coordination: Coordination normal.  Psychiatric:        Behavior: Behavior normal.      ED Treatments / Results  Labs (all labs ordered are listed, but only abnormal results are displayed) Labs Reviewed  CBC WITH DIFFERENTIAL/PLATELET - Abnormal; Notable for the following components:      Result Value   WBC 11.7 (*)    Hemoglobin 11.3 (*)    HCT 33.6 (*)    Lymphs Abs 4.6 (*)    Abs Immature Granulocytes 0.08 (*)    All other components within normal limits  URINALYSIS, ROUTINE W REFLEX MICROSCOPIC - Abnormal; Notable for the following components:   APPearance HAZY (*)    Hgb urine dipstick LARGE (*)    Protein, ur 30 (*)    Leukocytes,Ua TRACE (*)    RBC / HPF >50 (*)    All other components within normal limits  URINE CULTURE  LIPASE, BLOOD  COMPREHENSIVE METABOLIC PANEL  PREGNANCY, URINE    EKG None  Radiology No results found.  Procedures Procedures (including critical care time)  Medications Ordered in ED Medications  0.9 %  sodium chloride infusion ( Intravenous Not Given 12/24/18 2234)  sulfamethoxazole-trimethoprim (BACTRIM DS) 800-160 MG per tablet 1 tablet (has no administration in time range)  promethazine (PHENERGAN) injection 25 mg (25 mg Intramuscular Given 12/24/18 2242)     Initial Impression / Assessment and Plan / ED Course  I have reviewed the triage vital signs  and the nursing notes.  Pertinent labs & imaging results that were available during my care of the patient were reviewed by me and considered in my medical decision making (see chart for details).  Clinical Course as of Dec 24 2303  Mon Dec 24, 2018  2301 Urinalysis reveals large hemoglobin with too numerous to count red blood cells, 6-10 white blood cells and no bacteria.  Given the fact that she is having some symptoms of urinary frequency we will treat with presumptive antibiotics and  culture the urine, the patient is agreeable.  She was given intramuscular Phenergan, she is stable for discharge.  She is requesting more more pain medications but given her history of chronic pain and visits for frequent pain related complaints we will avoid opiate medications which may make her gastrointestinal symptoms even worse   [BM]    Clinical Course User Index [BM] Eber Hong, MD       The patient is afebrile, normal oxygen, normal temperature, normal heart rate, normal blood pressure.  She does not have any signs of sepsis, she has a nonsurgical abdomen but does have abdominal tenderness.  She does not report a history of frequent pancreatitis, review of our medical record shows that she has had multiple visits for pain related complaints in the past however given her recent history of not tolerating oral food or fluids we will check labs, given IV with some fluids and medications of the patient will be further evaluated.  She does not appear to be toxic appearing nor does she have a surgical abdomen.  She is agreeable to the plan.  Final Clinical Impressions(s) / ED Diagnoses   Final diagnoses:  Hematuria, unspecified type  Epigastric pain  Non-intractable vomiting with nausea, unspecified vomiting type    ED Discharge Orders         Ordered    promethazine (PHENERGAN) 25 MG tablet  Every 6 hours PRN     12/24/18 2304    sulfamethoxazole-trimethoprim (BACTRIM DS) 800-160 MG tablet  2 times daily     12/24/18 2304           Eber Hong, MD 12/24/18 2305

## 2018-12-25 ENCOUNTER — Encounter: Payer: Self-pay | Admitting: Obstetrics & Gynecology

## 2018-12-25 ENCOUNTER — Ambulatory Visit: Payer: Medicaid Other | Admitting: Obstetrics & Gynecology

## 2018-12-25 ENCOUNTER — Other Ambulatory Visit: Payer: Self-pay

## 2018-12-25 VITALS — BP 128/91 | HR 105 | Ht 60.0 in | Wt 178.0 lb

## 2018-12-25 DIAGNOSIS — N938 Other specified abnormal uterine and vaginal bleeding: Secondary | ICD-10-CM

## 2018-12-25 DIAGNOSIS — N920 Excessive and frequent menstruation with regular cycle: Secondary | ICD-10-CM | POA: Diagnosis not present

## 2018-12-25 DIAGNOSIS — N946 Dysmenorrhea, unspecified: Secondary | ICD-10-CM

## 2018-12-25 MED ORDER — MEGESTROL ACETATE 40 MG PO TABS: ORAL_TABLET | ORAL | 0 refills | Status: DC

## 2018-12-25 NOTE — Progress Notes (Signed)
Chief Complaint  Patient presents with  . Discuss Hysterecomy    ED visit Yesterday      33 y.o. [id number] Patient's last menstrual period was 12/04/2018 (exact date). The current method of family planning is tubal ligation.  Outpatient Encounter Medications as of 12/25/2018  Medication Sig Note  . amitriptyline (ELAVIL) 100 MG tablet Take 1 tablet (100 mg total) by mouth at bedtime.   . cyclobenzaprine (FLEXERIL) 10 MG tablet Take 1 tablet (10 mg total) by mouth 3 (three) times daily as needed for muscle spasms.   . DULoxetine (CYMBALTA) 30 MG capsule Take 1 capsule (30 mg total) by mouth daily. X 1 week, then 60 mg daily- x 1week- for nerve and back pain   . folic acid (FOLVITE) 1 MG tablet Take 1 tablet (1 mg total) by mouth daily.   . hydrOXYzine (ATARAX/VISTARIL) 25 MG tablet Take 25 mg by mouth 3 (three) times daily as needed.   . meloxicam (MOBIC) 15 MG tablet Take 15 mg by mouth daily.   . phenazopyridine (PYRIDIUM) 100 MG tablet Take 100 mg by mouth 3 (three) times daily.   . Polyethylene Glycol 3350 (MIRALAX PO) Take by mouth.   . pregabalin (LYRICA) 150 MG capsule Take 1 capsule (150 mg total) by mouth 2 (two) times daily.   . promethazine (PHENERGAN) 25 MG tablet Take 1 tablet (25 mg total) by mouth every 6 (six) hours as needed for nausea or vomiting.   . traZODone (DESYREL) 100 MG tablet Take 1 tablet (100 mg total) by mouth at bedtime.   . megestrol (MEGACE) 40 MG tablet 3 tablets a day for 5 days, 2 tablets a day for 5 days then 1 tablet daily   . sulfamethoxazole-trimethoprim (BACTRIM DS) 800-160 MG tablet Take 1 tablet by mouth 2 (two) times daily for 7 days. (Patient not taking: Reported on 12/25/2018)   . [DISCONTINUED] acetaminophen-codeine (TYLENOL #3) 300-30 MG tablet Take 1 tablet by mouth every 8 (eight) hours as needed for severe pain. (Patient not taking: Reported on 12/25/2018) 11/23/2018: Did not know to bring, last dose 5 days ago   . [DISCONTINUED]  HYDROcodone-acetaminophen (NORCO/VICODIN) 5-325 MG tablet Take 1 tablet by mouth every 12 (twelve) hours as needed for severe pain (on days pain spikes dramatically).   . [DISCONTINUED] nystatin (MYCOSTATIN) 100000 UNIT/ML suspension Take 5 mLs (500,000 Units total) by mouth 4 (four) times daily.   . [DISCONTINUED] prazosin (MINIPRESS) 5 MG capsule Take 5 mg by mouth at bedtime.   . [DISCONTINUED] QUEtiapine (SEROQUEL) 50 MG tablet Take 50 mg by mouth at bedtime.   . [DISCONTINUED] sertraline (ZOLOFT) 100 MG tablet Take 100 mg by mouth daily.   . [DISCONTINUED] tiZANidine (ZANAFLEX) 4 MG tablet Take 1 tablet (4 mg total) by mouth every 8 (eight) hours as needed for muscle spasms.    No facility-administered encounter medications on file as of 12/25/2018.     Subjective Pt was seen in ED yesterday at [facility] for RUQ/epigastric pain Evaluation was largely unremarkable She also is stating she was seen at [facility] in ED and treated for UTI/kidney infection and was given antibiotic and NSAID it appears, I was unable to find a record of it in care everywhere She is seen here for evaluation of menorrhagia dysmenorrhea, that pre dated her visits over the past week She uses towels and soils clothes and sheets Changes pads 4-5 times per hour Her periods have been like this for 1 year No  pain with intercourse Uses a heating pad Gets nauseated and throws up with each cycle when she bleeds not before Past Medical History:  Diagnosis Date  . Asthma   . Chronic abdominal pain   . Chronic migraine   . Chronic pain syndrome   . Fibromyalgia   . 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 Hypertension   . Migraine   . Pelvic inflammatory disease (PID)     Past Surgical History:  Procedure Laterality Date  . kidney infections    . TUBAL LIGATION    . WISDOM TOOTH EXTRACTION      OB History    Gravida  4   Para  4   Term  1   Preterm  3   AB      Living  4     SAB      TAB      Ectopic       Multiple      Live Births  4           Allergies  Allergen Reactions  . Metoclopramide Itching and Other (See Comments)    "makes me feel jittery"  . Tomato Shortness Of Breath, Swelling and Rash  . Aspirin Hives  . Doxycycline Nausea And Vomiting  . Hydromorphone Hcl Itching    Patient can take but has to have benadryl with it (Dilaudid)  . Ibuprofen Hives  . Ondansetron Nausea And Vomiting    Stomach pain.  . Other Hives    Mayonaise  . Shellfish Allergy Hives  . Toradol [Ketorolac Tromethamine] Hives  . Latex Itching and Rash    Vaginal Only  . Tramadol Rash    Social History   Socioeconomic History  . Marital status: Single    Spouse name: Not on file  . Number of children: 4  . Years of education: 8112  . Highest education level: Not on file  Occupational History    Comment: unemployed  Social Needs  . Financial resource strain: Not on file  . Food insecurity    Worry: Not on file    Inability: Not on file  . Transportation needs    Medical: Not on file    Non-medical: Not on file  Tobacco Use  . Smoking status: Current Some Day Smoker    Packs/day: 0.50    Types: Cigarettes  . Smokeless tobacco: Never Used  Substance and Sexual Activity  . Alcohol use: Not Currently  . Drug use: Yes    Types: Marijuana    Comment: last used October 3  . Sexual activity: Yes    Birth control/protection: Surgical  Lifestyle  . Physical activity    Days per week: Not on file    Minutes per session: Not on file  . Stress: Not on file  Relationships  . Social Musicianconnections    Talks on phone: Not on file    Gets together: Not on file    Attends religious service: Not on file    Active member of club or organization: Not on file    Attends meetings of clubs or organizations: Not on file    Relationship status: Not on file  Other Topics Concern  . Not on file  Social History Narrative   Lives with parents, children   Caffeine use- coffee once a day, sodas x 2 a  day    Family History  Problem Relation Age of Onset  . Hypertension Mother   . Diabetes Mother   .  Heart failure Father   . Colon cancer Paternal Grandfather     Medications:       Current Outpatient Medications:  .  amitriptyline (ELAVIL) 100 MG tablet, Take 1 tablet (100 mg total) by mouth at bedtime., Disp: 30 tablet, Rfl: 5 .  cyclobenzaprine (FLEXERIL) 10 MG tablet, Take 1 tablet (10 mg total) by mouth 3 (three) times daily as needed for muscle spasms., Disp: 90 tablet, Rfl: 5 .  DULoxetine (CYMBALTA) 30 MG capsule, Take 1 capsule (30 mg total) by mouth daily. X 1 week, then 60 mg daily- x 1week- for nerve and back pain, Disp: 60 capsule, Rfl: 2 .  folic acid (FOLVITE) 1 MG tablet, Take 1 tablet (1 mg total) by mouth daily., Disp: 30 tablet, Rfl: 2 .  hydrOXYzine (ATARAX/VISTARIL) 25 MG tablet, Take 25 mg by mouth 3 (three) times daily as needed., Disp: , Rfl:  .  meloxicam (MOBIC) 15 MG tablet, Take 15 mg by mouth daily., Disp: , Rfl:  .  phenazopyridine (PYRIDIUM) 100 MG tablet, Take 100 mg by mouth 3 (three) times daily., Disp: , Rfl:  .  Polyethylene Glycol 3350 (MIRALAX PO), Take by mouth., Disp: , Rfl:  .  pregabalin (LYRICA) 150 MG capsule, Take 1 capsule (150 mg total) by mouth 2 (two) times daily., Disp: 60 capsule, Rfl: 5 .  promethazine (PHENERGAN) 25 MG tablet, Take 1 tablet (25 mg total) by mouth every 6 (six) hours as needed for nausea or vomiting., Disp: 12 tablet, Rfl: 0 .  traZODone (DESYREL) 100 MG tablet, Take 1 tablet (100 mg total) by mouth at bedtime., Disp: 30 tablet, Rfl: 11 .  megestrol (MEGACE) 40 MG tablet, 3 tablets a day for 5 days, 2 tablets a day for 5 days then 1 tablet daily, Disp: 45 tablet, Rfl: 0 .  sulfamethoxazole-trimethoprim (BACTRIM DS) 800-160 MG tablet, Take 1 tablet by mouth 2 (two) times daily for 7 days. (Patient not taking: Reported on 12/25/2018), Disp: 14 tablet, Rfl: 0  Objective Blood pressure (!) 128/91, pulse (!) 105, height 5'  (1.524 m), weight 178 lb (80.7 kg), last menstrual period 12/04/2018.  General WDWN female NAD Vulva:  normal appearing vulva with no masses, tenderness or lesions Vagina:  normal mucosa, no discharge Cervix:  Normal no lesions Uterus:  normal size, contour, position, consistency, mobility, non-tender Adnexa: ovaries:present,  normal adnexa in size, nontender and no masses   Pertinent ROS No burning with urination, frequency or urgency No nausea, vomiting or diarrhea Nor fever chills or other constitutional symptoms   Labs or studies Results for orders placed or performed during the hospital encounter of 12/24/18 (from the past 48 hour(s))  Urinalysis, Routine w reflex microscopic   Collection Time: 12/24/18  8:47 PM  Result Value Ref Range   Color, Urine YELLOW YELLOW   APPearance HAZY (A) CLEAR   Specific Gravity, Urine 1.030 1.005 - 1.030   pH 6.0 5.0 - 8.0   Glucose, UA NEGATIVE NEGATIVE mg/dL   Hgb urine dipstick LARGE (A) NEGATIVE   Bilirubin Urine NEGATIVE NEGATIVE   Ketones, ur NEGATIVE NEGATIVE mg/dL   Protein, ur 30 (A) NEGATIVE mg/dL   Nitrite NEGATIVE NEGATIVE   Leukocytes,Ua TRACE (A) NEGATIVE   RBC / HPF >50 (H) 0 - 5 RBC/hpf   WBC, UA 6-10 0 - 5 WBC/hpf   Bacteria, UA NONE SEEN NONE SEEN   Squamous Epithelial / LPF 6-10 0 - 5   Mucus PRESENT   Lipase   Collection  Time: 12/24/18  9:20 PM  Result Value Ref Range   Lipase 31 11 - 51 U/L  CMP   Collection Time: 12/24/18  9:20 PM  Result Value Ref Range   Sodium 138 135 - 145 mmol/L   Potassium 3.8 3.5 - 5.1 mmol/L   Chloride 104 98 - 111 mmol/L   CO2 25 22 - 32 mmol/L   Glucose, Bld 92 70 - 99 mg/dL   BUN 12 6 - 20 mg/dL   Creatinine, Ser 0.80 0.44 - 1.00 mg/dL   Calcium 9.1 8.9 - 10.3 mg/dL   Total Protein 6.9 6.5 - 8.1 g/dL   Albumin 3.9 3.5 - 5.0 g/dL   AST 16 15 - 41 U/L   ALT 13 0 - 44 U/L   Alkaline Phosphatase 47 38 - 126 U/L   Total Bilirubin 0.3 0.3 - 1.2 mg/dL   GFR calc non Af Amer >60  >60 mL/min   GFR calc Af Amer >60 >60 mL/min   Anion gap 9 5 - 15  CBC with Differential   Collection Time: 12/24/18  9:20 PM  Result Value Ref Range   WBC 11.7 (H) 4.0 - 10.5 K/uL   RBC 3.97 3.87 - 5.11 MIL/uL   Hemoglobin 11.3 (L) 12.0 - 15.0 g/dL   HCT 33.6 (L) 36.0 - 46.0 %   MCV 84.6 80.0 - 100.0 fL   MCH 28.5 26.0 - 34.0 pg   MCHC 33.6 30.0 - 36.0 g/dL   RDW 13.3 11.5 - 15.5 %   Platelets 309 150 - 400 K/uL   nRBC 0.0 0.0 - 0.2 %   Neutrophils Relative % 48 %   Neutro Abs 5.7 1.7 - 7.7 K/uL   Lymphocytes Relative 39 %   Lymphs Abs 4.6 (H) 0.7 - 4.0 K/uL   Monocytes Relative 7 %   Monocytes Absolute 0.8 0.1 - 1.0 K/uL   Eosinophils Relative 4 %   Eosinophils Absolute 0.4 0.0 - 0.5 K/uL   Basophils Relative 1 %   Basophils Absolute 0.1 0.0 - 0.1 K/uL   Immature Granulocytes 1 %   Abs Immature Granulocytes 0.08 (H) 0.00 - 0.07 K/uL  Pregnancy, urine   Collection Time: 12/24/18 10:35 PM  Result Value Ref Range   Preg Test, Ur NEGATIVE NEGATIVE       Impression Diagnoses this Encounter::   ICD-10-CM   1. Menorrhagia with regular cycle  N92.0   2. DUB (dysfunctional uterine bleeding)  N93.8   3. Dysmenorrhea  N94.6     Established relevant diagnosis(es):   Plan/Recommendations: Meds ordered this encounter  Medications  . megestrol (MEGACE) 40 MG tablet    Sig: 3 tablets a day for 5 days, 2 tablets a day for 5 days then 1 tablet daily    Dispense:  45 tablet    Refill:  0    Labs or Scans Ordered: No orders of the defined types were placed in this encounter.   Management:: >megestrol for cycle management >sonogram 4 weeks to evaluate uterine/gyn anatomy  Follow up No follow-ups on file.      All questions were answered.

## 2018-12-26 ENCOUNTER — Other Ambulatory Visit: Payer: Self-pay | Admitting: Physical Medicine and Rehabilitation

## 2018-12-26 LAB — URINE CULTURE: Culture: <10000 — AB

## 2018-12-27 ENCOUNTER — Telehealth: Payer: Self-pay | Admitting: *Deleted

## 2018-12-27 MED ORDER — NYSTATIN 100000 UNIT/ML MT SUSP: 5.0000 mL | Freq: Four times a day (QID) | OROMUCOSAL | 0 refills | Status: DC

## 2018-12-27 NOTE — Telephone Encounter (Signed)
refill 

## 2019-01-12 ENCOUNTER — Other Ambulatory Visit: Payer: Self-pay | Admitting: Physical Medicine and Rehabilitation

## 2019-01-15 ENCOUNTER — Telehealth: Payer: Self-pay | Admitting: *Deleted

## 2019-01-15 NOTE — Telephone Encounter (Addendum)
Pt states Megace has not stopped period. Pt states her period is heavier, passing clots. Pt started med on 12/25/18. Went to [facility] and was diagnosed with UTI. Pt was given antibiotic for UTI. Pt finished UTI med last week. UTI symptoms are no better. Pt is having a lot of pain. Scheduled for [place] 01/22/19. Does pt need a visit to see you for bleeding and UTI or nurse visit for urine? Please advise. Thanks!! [place]

## 2019-01-15 NOTE — Telephone Encounter (Signed)
I stopped it because gave her Flexeril- If I remember, she said wasn't really helping her, so switched it.  Thanks

## 2019-01-15 NOTE — Telephone Encounter (Signed)
Patient left message that she saw Dr Elonda Husky a couple of weeks ago in which he prescribed her some medication to stop her cycle.  She is still bleeding along with passing clots and also has an UTI. Would like a call back.

## 2019-01-17 NOTE — Telephone Encounter (Signed)
No just keep her appointment  And she does not have a UTI, both cultures were negative, she may think she has a UTI based on her discomfort but it is probably related to her gyn issues, her urine cultures were negative  Keep her scheduled appt

## 2019-01-17 NOTE — Telephone Encounter (Signed)
Pt aware of Dr. Eure's recommendations and voiced understanding. JSY 

## 2019-01-21 ENCOUNTER — Telehealth: Payer: Self-pay | Admitting: Obstetrics & Gynecology

## 2019-01-21 NOTE — Telephone Encounter (Signed)
Called patient regarding appointment scheduled in our office and advised to come alone to the visit, however, a support person, over age 35, may accompany her to appointment if assistance is needed for safety or care concerns. Otherwise, support persons should remain outside until the visit is complete.   Prescreen questions asked: 1. Any of the following symptoms of COVID such as chills, fever, cough, shortness of breath, muscle pain, diarrhea, rash, vomiting, abdominal pain, red eye, weakness, bruising, bleeding, joint pain, loss of taste or smell, a severe headache, sore throat, fatigue 2. Any exposure to anyone suspected or confirmed of having COVID-19 3. Awaiting test results for COVID-19  Also,to keep you safe, please use the provided hand sanitizer when you enter the office. We are asking everyone in the office to wear a mask to help prevent the spread of germs. If you have a mask of your own, please wear it to your appointment, if not, we are happy to provide one for you.  Thank you for understanding and your cooperation.    [facility] Staff      

## 2019-01-22 ENCOUNTER — Ambulatory Visit (INDEPENDENT_AMBULATORY_CARE_PROVIDER_SITE_OTHER): Payer: Medicaid Other

## 2019-01-22 ENCOUNTER — Encounter: Payer: Self-pay | Admitting: Obstetrics & Gynecology

## 2019-01-22 ENCOUNTER — Ambulatory Visit: Payer: Medicaid Other | Admitting: Obstetrics & Gynecology

## 2019-01-22 ENCOUNTER — Other Ambulatory Visit: Payer: Self-pay

## 2019-01-22 VITALS — BP 142/90 | HR 86 | Ht 60.0 in | Wt 175.0 lb

## 2019-01-22 DIAGNOSIS — N938 Other specified abnormal uterine and vaginal bleeding: Secondary | ICD-10-CM

## 2019-01-22 DIAGNOSIS — N946 Dysmenorrhea, unspecified: Secondary | ICD-10-CM

## 2019-01-22 DIAGNOSIS — R102 Pelvic and perineal pain: Secondary | ICD-10-CM | POA: Diagnosis not present

## 2019-01-22 DIAGNOSIS — N941 Unspecified dyspareunia: Secondary | ICD-10-CM | POA: Diagnosis not present

## 2019-01-22 DIAGNOSIS — G8929 Other chronic pain: Secondary | ICD-10-CM

## 2019-01-22 DIAGNOSIS — N920 Excessive and frequent menstruation with regular cycle: Secondary | ICD-10-CM

## 2019-01-22 MED ORDER — ACETAMINOPHEN-CODEINE #3 300-30 MG PO TABS: 1.0000 | ORAL_TABLET | ORAL | 0 refills | Status: DC | PRN

## 2019-01-22 NOTE — Progress Notes (Addendum)
PELVIC US TA/TV: homogeneous anteverted uterus,wnl,EEC 15 mm (difficult to see boarders of endometrium),trace of simple fluid within the endometrium,normal ovaries,ovaries appear mobile,pelvic pain during ultrasound  Chaperone: Marylu Lund

## 2019-01-22 NOTE — Progress Notes (Signed)
Follow up appointment for results  Chief Complaint  Patient presents with  . Follow-up    Blood pressure (!) 142/90, pulse 86, height 5' (1.524 m), weight 175 lb (79.4 kg).  US Transvaginal Non-OB  Result Date: 01/22/2019 GYNECOLOGIC SONOGRAM Tanya Krause is a 34 y.o. [id number] LMP 10/04/2018,she is here for a pelvic sonogram for menorrhagia/dysmenorrhea. Uterus                      7.2 x 4 x 4.7 cm, Total uterine volume 70 cc, homogeneous anteverted uterus,wnl Endometrium          15 mm, symmetrical, difficult to see boarders of endometrium,trace of simple fluid within the endometrium Right ovary             3.1 x 2.3 x 3 cm, wnl Left ovary                2.6 x 2 x 2.3 cm, wnl Mult small nabothian cysts Technician Comments: PELVIC US TA/TV: homogeneous anteverted uterus,wnl,EEC 15 mm (difficult to see boarders of endometrium),trace of simple fluid within the endometrium,normal ovaries,ovaries appear mobile,pelvic pain during ultrasound [employer] 01/22/2019 3:44 PM Clinical Impression and recommendations: I have reviewed the sonogram results above, combined with the patient's current clinical course, below are my impressions and any appropriate recommendations for management based on the sonographic findings. Uterus is normal Endometrium consistent with megestrol therapy, normal no submucosal myoma is noted Both ovaries are normal Lazaro Arms 01/22/2019 3:59 PM  GYN Trans ABD  Result Date: 01/22/2019 GYNECOLOGIC SONOGRAM Tanya Krause is a 34 y.o. [id number] LMP 10/04/2018,she is here for a pelvic sonogram for menorrhagia/dysmenorrhea. Uterus                      7.2 x 4 x 4.7 cm, Total uterine volume 70 cc, homogeneous anteverted uterus,wnl Endometrium          15 mm, symmetrical, difficult to see boarders of endometrium,trace of simple fluid within the endometrium Right ovary             3.1 x 2.3 x 3 cm, wnl Left ovary                2.6 x 2 x 2.3 cm, wnl Mult small nabothian cysts Technician  Comments: PELVIC US TA/TV: homogeneous anteverted uterus,wnl,EEC 15 mm (difficult to see boarders of endometrium),trace of simple fluid within the endometrium,normal ovaries,ovaries appear mobile,pelvic pain during ultrasound [employer] 01/22/2019 3:44 PM Clinical Impression and recommendations: I have reviewed the sonogram results above, combined with the patient's current clinical course, below are my impressions and any appropriate recommendations for management based on the sonographic findings. Uterus is normal Endometrium consistent with megestrol therapy, normal no submucosal myoma is noted Both ovaries are normal Lazaro Arms 01/22/2019 3:59 PM     MEDS ordered this encounter: No orders of the defined types were placed in this encounter.   Orders for this encounter: No orders of the defined types were placed in this encounter.   Impression:   ICD-10-CM   1. DUB (dysfunctional uterine bleeding)  N93.8   2. Dysmenorrhea  N94.6   3. Dyspareunia, female  N94.10   4. Chronic pelvic pain in female  R10.2    G89.29      Plan: TAH BSO  Follow Up: Return in about 27 days (around 02/18/2019) for Post Op, with Dr Despina Hidden.  Face to face time:  15 minutes  Greater than 50% of the visit time was spent in counseling and coordination of care with the patient.  The summary and outline of the counseling and care coordination is summarized in the note above.   All questions were answered.  Past Medical History:  Diagnosis Date  . Asthma   . Chronic abdominal pain   . Chronic migraine   . Chronic pain syndrome   . Fibromyalgia   . 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 Hypertension   . Migraine   . Pelvic inflammatory disease (PID)     Past Surgical History:  Procedure Laterality Date  . kidney infections    . TUBAL LIGATION    . WISDOM TOOTH EXTRACTION      OB History    Gravida  4   Para  4   Term  1   Preterm  3   AB      Living  4     SAB      TAB       Ectopic      Multiple      Live Births  4           Allergies  Allergen Reactions  . Metoclopramide Itching and Other (See Comments)    "makes me feel jittery"  . Tomato Shortness Of Breath, Swelling and Rash  . Aspirin Hives  . Doxycycline Nausea And Vomiting  . Hydromorphone Hcl Itching    Patient can take but has to have benadryl with it (Dilaudid)  . Ibuprofen Hives  . Ondansetron Nausea And Vomiting    Stomach pain.  . Other Hives    Mayonaise  . Shellfish Allergy Hives  . Toradol [Ketorolac Tromethamine] Hives  . Latex Itching and Rash    Vaginal Only  . Tramadol Rash    Social History   Socioeconomic History  . Marital status: Single    Spouse name: Not on file  . Number of children: 4  . Years of education: 21  . Highest education level: Not on file  Occupational History    Comment: unemployed  Tobacco Use  . Smoking status: Current Some Day Smoker    Packs/day: 0.50    Types: Cigarettes  . Smokeless tobacco: Never Used  Substance and Sexual Activity  . Alcohol use: Not Currently  . Drug use: Yes    Types: Marijuana    Comment: last used October 3  . Sexual activity: Yes    Birth control/protection: Surgical  Other Topics Concern  . Not on file  Social History Narrative   Lives with parents, children   Caffeine use- coffee once a day, sodas x 2 a day   Social Determinants of Health   Financial Resource Strain:   . Difficulty of Paying Living Expenses: Not on file  Food Insecurity:   . Worried About Programme researcher, broadcasting/film/video in the Last Year: Not on file  . Ran Out of Food in the Last Year: Not on file  Transportation Needs:   . Lack of Transportation (Medical): Not on file  . Lack of Transportation (Non-Medical): Not on file  Physical Activity:   . Days of Exercise per Week: Not on file  . Minutes of Exercise per Session: Not on file  Stress:   . Feeling of Stress : Not on file  Social Connections:   . Frequency of Communication with  Friends and Family: Not on file  . Frequency  of Social Gatherings with Friends and Family: Not on file  . Attends Religious Services: Not on file  . Active Member of Clubs or Organizations: Not on file  . Attends Archivist Meetings: Not on file  . Marital Status: Not on file    Family History  Problem Relation Age of Onset  . Hypertension Mother   . Diabetes Mother   . Heart failure Father   . Colon cancer Paternal Grandfather

## 2019-01-23 ENCOUNTER — Encounter: Payer: Self-pay | Admitting: Physical Medicine and Rehabilitation

## 2019-01-23 ENCOUNTER — Encounter
Payer: Medicaid Other | Attending: Physical Medicine and Rehabilitation | Admitting: Physical Medicine and Rehabilitation

## 2019-01-23 VITALS — BP 124/91 | HR 110 | Temp 97.9°F | Ht 60.0 in | Wt 175.0 lb

## 2019-01-23 DIAGNOSIS — Z5181 Encounter for therapeutic drug level monitoring: Secondary | ICD-10-CM | POA: Diagnosis present

## 2019-01-23 DIAGNOSIS — F411 Generalized anxiety disorder: Secondary | ICD-10-CM

## 2019-01-23 DIAGNOSIS — R1084 Generalized abdominal pain: Secondary | ICD-10-CM

## 2019-01-23 DIAGNOSIS — R519 Headache, unspecified: Secondary | ICD-10-CM

## 2019-01-23 DIAGNOSIS — G894 Chronic pain syndrome: Secondary | ICD-10-CM | POA: Diagnosis present

## 2019-01-23 DIAGNOSIS — M7918 Myalgia, other site: Secondary | ICD-10-CM | POA: Diagnosis not present

## 2019-01-23 DIAGNOSIS — G8929 Other chronic pain: Secondary | ICD-10-CM | POA: Diagnosis present

## 2019-01-23 DIAGNOSIS — M797 Fibromyalgia: Secondary | ICD-10-CM | POA: Diagnosis present

## 2019-01-23 DIAGNOSIS — R112 Nausea with vomiting, unspecified: Secondary | ICD-10-CM

## 2019-01-23 DIAGNOSIS — M549 Dorsalgia, unspecified: Secondary | ICD-10-CM | POA: Insufficient documentation

## 2019-01-23 DIAGNOSIS — G43709 Chronic migraine without aura, not intractable, without status migrainosus: Secondary | ICD-10-CM

## 2019-01-23 DIAGNOSIS — IMO0002 Reserved for concepts with insufficient information to code with codable children: Secondary | ICD-10-CM

## 2019-01-23 MED ORDER — HYDROCODONE-ACETAMINOPHEN 5-325 MG PO TABS: 1.0000 | ORAL_TABLET | Freq: Four times a day (QID) | ORAL | 0 refills | Status: DC | PRN

## 2019-01-23 MED ORDER — PROMETHAZINE HCL 6.25 MG/5ML PO SYRP: 12.5000 mg | ORAL_SOLUTION | Freq: Four times a day (QID) | ORAL | 5 refills | Status: DC | PRN

## 2019-01-23 MED ORDER — TIZANIDINE HCL 4 MG PO TABS: 4.0000 mg | ORAL_TABLET | Freq: Three times a day (TID) | ORAL | 1 refills | Status: DC | PRN

## 2019-01-23 MED ORDER — ZOLPIDEM TARTRATE 5 MG PO TABS: 5.0000 mg | ORAL_TABLET | Freq: Every evening | ORAL | 0 refills | Status: DC | PRN

## 2019-01-23 NOTE — Patient Instructions (Signed)
1. Can prescribe Zanaflex AND Flexeril but don't take at same time- can alternate them, but not at same time. Zanaflex 4 mg TID prn  2.  Phenergan syrup per pt 12.5 mg Q6 hours prn  3. Hb is likely low due to physical exam findings-  Will check CBC and CMP due to chronic abd pain.   4. Refill Norco- is due in a 1 week, so will refill to be picked up next week.   5. Wants Trigger point injections- fibromyalgia/chronic pain  Patient here for trigger point injections for  Consent done and on chart.  Cleaned areas with alcohol and injected using a 27 gauge 1.5 inch needle  Injected 5cc Using 1% Lidocaine with no EPI  Upper traps B/L Levators R Posterior scalenes R Middle scalenes Splenius Capitus B/L Pectoralis Major B/L Rhomboids Infraspinatus Teres Major/minor Thoracic paraspinals Lumbar paraspinals x2 B/L Other injections-    Patient's level of pain prior was 9-10/10 Current level of pain after injections is- still is afterwards, since most pain is abdominal   There was no bleeding or complications.  Patient was advised to drink a lot of water on day after injections to flush system Will have increased soreness for 12-48 hours after injections.  Can use Lidocaine patches the day AFTER injections Can use theracane on day of injections in places didn't inject Can use heating pad 4-6 hours AFTER injections  6. Ambien 5 mg QHS for sleep since didn't respond to Trazodone/Elavil  7. Strongly suggest, due to osteoporosis/more bone loss, suggest leaving ovaries, unless has endometriosis affecting ovaries.   8 Suggest filing for Disability due to all medical issues- including chronic migraines, myofascial pain syndrome, severe anemia due to chronic blood loss from needing hysterectomy, and chronic pain.  9. Can try Voltaren gel as needed for hands locking up- is over the counter.   10. F/u in 4 weeks

## 2019-01-23 NOTE — Progress Notes (Signed)
   Has to get Hysterectomy on 02/06/19- menstrual- has been bleeding since September.  Is cold- likely due to Hb low. Not feels self last couple of months.  In so much pain, bleeding so much. Doesn't have any meds for sleep.   Flexeril helps some, but would also would like Zanaflex- just in case.  Hands are "locking up" and "stuck" and has to get GF to massage hands.    Not sleeping due to pain. Can't take Trazodone due to didn't work Can't take Elavil due to rash in mouth Ambien has worked in past- hasn't used lately.  Wanted phenergan- has been having N/V- can't take phenergan pills- vomiting them up. Wants liquid.   Pulse is 110 and BP 124/91  Exam: Awake, alert, appropriate, but looks poor pale color Conjunctiva very pale BL Tight trigger point per trigger point injections Esp low back paraspinals   Plan: 1. Can prescribe Zanaflex AND Flexeril but don't take at same time- can alternate them, but not at same time. Zanaflex 4 mg TID prn  2.  Phenergan syrup per pt 12.5 mg Q6 hours prn  3. Hb is likely low due to physical exam findings-  Will check CBC and CMP due to chronic abd pain.   4. Refill Norco- is due in a 1 week, so will refill to be picked up next week.   5. Wants Trigger point injections- fibromyalgia/chronic pain  Patient here for trigger point injections for  Consent done and on chart.  Cleaned areas with alcohol and injected using a 27 gauge 1.5 inch needle  Injected 5cc Using 1% Lidocaine with no EPI  Upper traps B/L Levators R Posterior scalenes R Middle scalenes Splenius Capitus B/L Pectoralis Major B/L Rhomboids Infraspinatus Teres Major/minor Thoracic paraspinals Lumbar paraspinals x2 B/L Other injections-    Patient's level of pain prior was 9-10/10 Current level of pain after injections is- still is afterwards, since most pain is abdominal   There was no bleeding or complications.  Patient was advised to drink a lot of water  on day after injections to flush system Will have increased soreness for 12-48 hours after injections.  Can use Lidocaine patches the day AFTER injections Can use theracane on day of injections in places didn't inject Can use heating pad 4-6 hours AFTER injections  6. Ambien 5 mg QHS for sleep since didn't respond to Trazodone/Elavil  7. Strongly suggest, due to osteoporosis/more bone loss, suggest leaving ovaries, unless has endometriosis affecting ovaries.   8 Suggest filing for Disability due to all medical issues- including chronic migraines, myofascial pain syndrome, severe anemia due to chronic blood loss from needing hysterectomy, and chronic pain.  9. Can try Voltaren gel as needed for hands locking up- is over the counter.   10. F/u in 4 weeks

## 2019-01-24 LAB — CBC WITH DIFFERENTIAL/PLATELET
Basophils Absolute: 0.1 10*3/uL (ref 0.0–0.2)
Basos: 1 %
EOS (ABSOLUTE): 0.3 10*3/uL (ref 0.0–0.4)
Eos: 3 %
Hematocrit: 40 % (ref 34.0–46.6)
Hemoglobin: 13.3 g/dL (ref 11.1–15.9)
Immature Grans (Abs): 0.1 10*3/uL (ref 0.0–0.1)
Immature Granulocytes: 1 %
Lymphocytes Absolute: 4.6 10*3/uL — ABNORMAL HIGH (ref 0.7–3.1)
Lymphs: 39 %
MCH: 28.4 pg (ref 26.6–33.0)
MCHC: 33.3 g/dL (ref 31.5–35.7)
MCV: 85 fL (ref 79–97)
Monocytes Absolute: 0.7 10*3/uL (ref 0.1–0.9)
Monocytes: 6 %
Neutrophils Absolute: 6.1 10*3/uL (ref 1.4–7.0)
Neutrophils: 50 %
Platelets: 349 10*3/uL (ref 150–450)
RBC: 4.69 x10E6/uL (ref 3.77–5.28)
RDW: 13.2 % (ref 11.7–15.4)
WBC: 11.8 10*3/uL — ABNORMAL HIGH (ref 3.4–10.8)

## 2019-02-01 NOTE — Patient Instructions (Signed)
Tanya Krause  02/01/2019     @PREFPERIOPPHARMACY @   Your procedure is scheduled on  02/06/2019  Report to [facility] at  0800  A.M.  Call this number if you have problems the morning of surgery:  [phone]   Remember:  Do not eat or drink after midnight.                     Take these medicines the morning of surgery with A SIP OF WATER  Tylenol#3 or hydrocodone(if needed), cymbalta, phenergan(if needed), hydroxyzine, mobic or zanaflex( if needed), lyrica.    Do not wear jewelry, make-up or nail polish.  Do not wear lotions, powders, or perfumes. Please wear deodorant and brush your teeth.  Do not shave 48 hours prior to surgery.  Men may shave face and neck.  Do not bring valuables to the hospital.  [facility] is not responsible for any belongings or valuables.  Contacts, dentures or bridgework may not be worn into surgery.  Leave your suitcase in the car.  After surgery it may be brought to your room.  For patients admitted to the hospital, discharge time will be determined by your treatment team.  Patients discharged the day of surgery will not be allowed to drive home.   Name and phone number of your driver:   family Special instructions:  None  Please read over the following fact sheets that you were given. Anesthesia Post-op Instructions and Care and Recovery After Surgery       Bilateral Salpingo-Oophorectomy, Care After This sheet gives you information about how to care for yourself after your procedure. Your health care provider may also give you more specific instructions. If you have problems or questions, contact your health care provider. What can I expect after the procedure? After the procedure, it is common to have:  Abdominal pain.  Some occasional vaginal bleeding (spotting).  Tiredness.  Symptoms of menopause, such as hot flashes, night sweats, or mood swings. Follow these instructions at home: Incision care   Keep your  incision area and your bandage (dressing) clean and dry.  Follow instructions from your health care provider about how to take care of your incision. Make sure you: ? Wash your hands with soap and water before you change your dressing. If soap and water are not available, use hand sanitizer. ? Change your dressing as told by your health care provider. ? Leave stitches (sutures), staples, skin glue, or adhesive strips in place. These skin closures may need to stay in place for 2 weeks or longer. If adhesive strip edges start to loosen and curl up, you may trim the loose edges. Do not remove adhesive strips completely unless your health care provider tells you to do that.  Check your incision area every day for signs of infection. Check for: ? Redness, swelling, or pain. ? Fluid or blood. ? Warmth. ? Pus or a bad smell. Activity   Do not drive or use heavy machinery while taking prescription pain medicine.  Do not drive for 24 hours if you received a medicine to help you relax (sedative) during your procedure.  Take frequent, short walks throughout the day. Rest when you get tired. Ask your health care provider what activities are safe for you.  Avoid activity that requires great effort. Also, avoid heavy lifting. Do not lift anything that is heavier than 10 lbs. (4.5 kg), or the limit that your health  care provider tells you, until he or she says that it is safe to do so.  Do not douche, use tampons, or have sex until your health care provider approves. General instructions   To prevent or treat constipation while you are taking prescription pain medicine, your health care provider may recommend that you: ? Drink enough fluid to keep your urine clear or pale yellow. ? Take over-the-counter or prescription medicines. ? Eat foods that are high in fiber, such as fresh fruits and vegetables, whole grains, and beans. ? Limit foods that are high in fat and processed sugars, such as fried and  sweet foods.  Take over-the-counter and prescription medicines only as told by your health care provider.  Do not take baths, swim, or use a hot tub until your health care provider approves. Ask your health care provider if you can take showers. You may only be allowed to take sponge baths for bathing.  Wear compression stockings as told by your health care provider. These stockings help to prevent blood clots and reduce swelling in your legs.  Keep all follow-up visits as told by your health care provider. This is important. Contact a health care provider if:  You have pain when you urinate.  You have pus or a bad smelling discharge coming from your vagina.  You have redness, swelling, or pain around your incision.  You have fluid or blood coming from your incision.  Your incision feels warm to the touch.  You have pus or a bad smell coming from your incision.  You have a fever.  Your incision starts to break open.  You have pain in the abdomen, and it gets worse or does not get better when you take medicine.  You develop a rash.  You develop nausea and vomiting.  You feel lightheaded. Get help right away if:  You develop pain in your chest or leg.  You become short of breath.  You faint.  You have increased bleeding from your vagina. Summary  After the procedure, it is common to have pain, bleeding in the vagina, and symptoms of menopause.  Follow instructions from your health care provider about how to take care of your incision.  Follow instructions from your health care provider about activities and restrictions.  Check your incision every day for signs of infection and report any symptoms to your health care provider. This information is not intended to replace advice given to you by your health care provider. Make sure you discuss any questions you have with your health care provider. Document Revised: 03/09/2018 Document Reviewed: 02/08/2016 Elsevier  Patient Education  [place].  Abdominal Hysterectomy, Care After This sheet gives you information about how to care for yourself after your procedure. Your doctor may also give you more specific instructions. If you have problems or questions, contact your doctor. Follow these instructions at home: Bathing  Do not take baths, swim, or use a hot tub until your doctor says it is okay. Ask your doctor if you can take showers. You may only be allowed to take sponge baths for bathing.  Keep the bandage (dressing) dry until your doctor says it can be taken off. Surgical cut (incision) care      Follow instructions from your doctor about how to take care of your cut from surgery. Make sure you: ? Wash your hands with soap and water before you change your bandage (dressing). If you cannot use soap and water, use hand sanitizer. ?  Change your bandage as told by your doctor. ? Leave stitches (sutures), skin glue, or skin tape (adhesive) strips in place. They may need to stay in place for 2 weeks or longer. If tape strips get loose and curl up, you may trim the loose edges. Do not remove tape strips completely unless your doctor says it is okay.  Check your surgical cut area every day for signs of infection. Check for: ? Redness, swelling, or pain. ? Fluid or blood. ? Warmth. ? Pus or a bad smell. Activity  Do gentle, daily exercise as told by your doctor. You may be told to take short walks every day and go farther each time.  Do not lift anything that is heavier than 10 lb (4.5 kg), or the limit that your doctor tells you, until he or she says that it is safe.  Do not drive or use heavy machinery while taking prescription pain medicine.  Do not drive for 24 hours if you were given a medicine to help you relax (sedative).  Follow your doctor's advice about exercise, driving, and general activities. Ask your doctor what activities are safe for you. Lifestyle   Do not douche, use  tampons, or have sex for at least 6 weeks or as told by your doctor.  Do not drink alcohol until your doctor says it is okay.  Drink enough fluid to keep your pee (urine) clear or pale yellow.  Try to have someone at home with you for the first 1-2 weeks to help.  Do not use any products that contain nicotine or tobacco, such as cigarettes and e-cigarettes. These can slow down healing. If you need help quitting, ask your doctor. General instructions  Take over-the-counter and prescription medicines only as told by your doctor.  Do not take aspirin or ibuprofen. These medicines can cause bleeding.  To prevent or treat constipation while you are taking prescription pain medicine, your doctor may suggest that you: ? Drink enough fluid to keep your urine clear or pale yellow. ? Take over-the-counter or prescription medicines. ? Eat foods that are high in fiber, such as:  Fresh fruits and vegetables.  Whole grains.  Beans. ? Limit foods that are high in fat and processed sugars, such as fried and sweet foods.  Keep all follow-up visits as told by your doctor. This is important. Contact a doctor if:  You have chills or fever.  You have redness, swelling, or pain around your cut.  You have fluid or blood coming from your cut.  Your cut feels warm to the touch.  You have pus or a bad smell coming from your cut.  Your cut breaks open.  You feel dizzy or light-headed.  You have pain or bleeding when you pee.  You keep having watery poop (diarrhea).  You keep feeling sick to your stomach (nauseous) or keep throwing up (vomiting).  You have unusual fluid (discharge) coming from your vagina.  You have a rash.  You have a reaction to your medicine.  Your pain medicine does not help. Get help right away if:  You have a fever and your symptoms get worse all of a sudden.  You have very bad belly (abdominal) pain.  You are short of breath.  You pass out (faint).  You  have pain, swelling, or redness of your leg.  You bleed a lot from your vagina and notice clumps of blood (clots). Summary  Do not take baths, swim, or use a hot tub  until your doctor says it is okay. Ask your doctor if you can take showers. You may only be allowed to take sponge baths for bathing.  Follow your doctor's advice about exercise, driving, and general activities. Ask your doctor what activities are safe for you.  Do not lift anything that is heavier than 10 lb (4.5 kg), or the limit that your doctor tells you, until he or she says that it is safe.  Try to have someone at home with you for the first 1-2 weeks to help. This information is not intended to replace advice given to you by your health care provider. Make sure you discuss any questions you have with your health care provider. Document Revised: 03/08/2018 Document Reviewed: 12/23/2015 Elsevier Patient Education  2020 [employer]. How to Use Chlorhexidine for Bathing Chlorhexidine gluconate (CHG) is a germ-killing (antiseptic) solution that is used to clean the skin. It can get rid of the bacteria that normally live on the skin and can keep them away for about 24 hours. To clean your skin with CHG, you may be given:  A CHG solution to use in the shower or as part of a sponge bath.  A prepackaged cloth that contains CHG. Cleaning your skin with CHG may help lower the risk for infection:  While you are staying in the intensive care unit of the hospital.  If you have a vascular access, such as a central line, to provide short-term or long-term access to your veins.  If you have a catheter to drain urine from your bladder.  If you are on a ventilator. A ventilator is a machine that helps you breathe by moving air in and out of your lungs.  After surgery. What are the risks? Risks of using CHG include:  A skin reaction.  Hearing loss, if CHG gets in your ears.  Eye injury, if CHG gets in your eyes and is not  rinsed out.  The CHG product catching fire. Make sure that you avoid smoking and flames after applying CHG to your skin. Do not use CHG:  If you have a chlorhexidine allergy or have previously reacted to chlorhexidine.  On babies younger than 19 months of age. How to use CHG solution  Use CHG only as told by your health care provider, and follow the instructions on the label.  Use the full amount of CHG as directed. Usually, this is one bottle. During a shower Follow these steps when using CHG solution during a shower (unless your health care provider gives you different instructions): 1. Start the shower. 2. Use your normal soap and shampoo to wash your face and hair. 3. Turn off the shower or move out of the shower stream. 4. Pour the CHG onto a clean washcloth. Do not use any type of brush or rough-edged sponge. 5. Starting at your neck, lather your body down to your toes. Make sure you follow these instructions: ? If you will be having surgery, pay special attention to the part of your body where you will be having surgery. Scrub this area for at least 1 minute. ? Do not use CHG on your head or face. If the solution gets into your ears or eyes, rinse them well with water. ? Avoid your genital area. ? Avoid any areas of skin that have broken skin, cuts, or scrapes. ? Scrub your back and under your arms. Make sure to wash skin folds. 6. Let the lather sit on your skin for 1-2 minutes  or as long as told by your health care provider. 7. Thoroughly rinse your entire body in the shower. Make sure that all body creases and crevices are rinsed well. 8. Dry off with a clean towel. Do not put any substances on your body afterward--such as powder, lotion, or perfume--unless you are told to do so by your health care provider. Only use lotions that are recommended by the manufacturer. 9. Put on clean clothes or pajamas. 10. If it is the night before your surgery, sleep in clean sheets.  During  a sponge bath Follow these steps when using CHG solution during a sponge bath (unless your health care provider gives you different instructions): 1. Use your normal soap and shampoo to wash your face and hair. 2. Pour the CHG onto a clean washcloth. 3. Starting at your neck, lather your body down to your toes. Make sure you follow these instructions: ? If you will be having surgery, pay special attention to the part of your body where you will be having surgery. Scrub this area for at least 1 minute. ? Do not use CHG on your head or face. If the solution gets into your ears or eyes, rinse them well with water. ? Avoid your genital area. ? Avoid any areas of skin that have broken skin, cuts, or scrapes. ? Scrub your back and under your arms. Make sure to wash skin folds. 4. Let the lather sit on your skin for 1-2 minutes or as long as told by your health care provider. 5. Using a different clean, wet washcloth, thoroughly rinse your entire body. Make sure that all body creases and crevices are rinsed well. 6. Dry off with a clean towel. Do not put any substances on your body afterward--such as powder, lotion, or perfume--unless you are told to do so by your health care provider. Only use lotions that are recommended by the manufacturer. 7. Put on clean clothes or pajamas. 8. If it is the night before your surgery, sleep in clean sheets. How to use CHG prepackaged cloths  Only use CHG cloths as told by your health care provider, and follow the instructions on the label.  Use the CHG cloth on clean, dry skin.  Do not use the CHG cloth on your head or face unless your health care provider tells you to.  When washing with the CHG cloth: ? Avoid your genital area. ? Avoid any areas of skin that have broken skin, cuts, or scrapes. Before surgery Follow these steps when using a CHG cloth to clean before surgery (unless your health care provider gives you different instructions): 1. Using the CHG  cloth, vigorously scrub the part of your body where you will be having surgery. Scrub using a back-and-forth motion for 3 minutes. The area on your body should be completely wet with CHG when you are done scrubbing. 2. Do not rinse. Discard the cloth and let the area air-dry. Do not put any substances on the area afterward, such as powder, lotion, or perfume. 3. Put on clean clothes or pajamas. 4. If it is the night before your surgery, sleep in clean sheets.  For general bathing Follow these steps when using CHG cloths for general bathing (unless your health care provider gives you different instructions). 1. Use a separate CHG cloth for each area of your body. Make sure you wash between any folds of skin and between your fingers and toes. Wash your body in the following order, switching to a new  cloth after each step: ? The front of your neck, shoulders, and chest. ? Both of your arms, under your arms, and your hands. ? Your stomach and groin area, avoiding the genitals. ? Your right leg and foot. ? Your left leg and foot. ? The back of your neck, your back, and your buttocks. 2. Do not rinse. Discard the cloth and let the area air-dry. Do not put any substances on your body afterward--such as powder, lotion, or perfume--unless you are told to do so by your health care provider. Only use lotions that are recommended by the manufacturer. 3. Put on clean clothes or pajamas. Contact a health care provider if:  Your skin gets irritated after scrubbing.  You have questions about using your solution or cloth. Get help right away if:  Your eyes become very red or swollen.  Your eyes itch badly.  Your skin itches badly and is red or swollen.  Your hearing changes.  You have trouble seeing.  You have swelling or tingling in your mouth or throat.  You have trouble breathing.  You swallow any chlorhexidine. Summary  Chlorhexidine gluconate (CHG) is a germ-killing (antiseptic) solution  that is used to clean the skin. Cleaning your skin with CHG may help to lower your risk for infection.  You may be given CHG to use for bathing. It may be in a bottle or in a prepackaged cloth to use on your skin. Carefully follow your health care provider's instructions and the instructions on the product label.  Do not use CHG if you have a chlorhexidine allergy.  Contact your health care provider if your skin gets irritated after scrubbing. This information is not intended to replace advice given to you by your health care provider. Make sure you discuss any questions you have with your health care provider. Document Revised: 03/22/2018 Document Reviewed: 12/01/2016 Elsevier Patient Education  2020 [employer].

## 2019-02-04 ENCOUNTER — Encounter (HOSPITAL_COMMUNITY)
Admission: RE | Admit: 2019-02-04 | Discharge: 2019-02-04 | Disposition: A | Discharge status: Home / Self Care | Payer: Medicaid Other | Source: Ambulatory Visit | Attending: Obstetrics & Gynecology | Admitting: Obstetrics & Gynecology

## 2019-02-04 ENCOUNTER — Other Ambulatory Visit: Payer: Self-pay

## 2019-02-04 ENCOUNTER — Encounter (HOSPITAL_COMMUNITY): Payer: Self-pay

## 2019-02-04 ENCOUNTER — Other Ambulatory Visit (HOSPITAL_COMMUNITY)
Admission: RE | Admit: 2019-02-04 | Discharge: 2019-02-04 | Disposition: A | Discharge status: Home / Self Care | Payer: Medicaid Other | Source: Ambulatory Visit | Attending: Obstetrics & Gynecology | Admitting: Obstetrics & Gynecology

## 2019-02-04 ENCOUNTER — Other Ambulatory Visit: Payer: Self-pay | Admitting: Obstetrics & Gynecology

## 2019-02-04 DIAGNOSIS — Z01812 Encounter for preprocedural laboratory examination: Secondary | ICD-10-CM | POA: Insufficient documentation

## 2019-02-04 LAB — TYPE AND SCREEN
ABO/RH(D): B POS
Antibody Screen: NEGATIVE

## 2019-02-04 LAB — COMPREHENSIVE METABOLIC PANEL
ALT: 12 U/L (ref 0–44)
AST: 19 U/L (ref 15–41)
Albumin: 4.2 g/dL (ref 3.5–5.0)
Alkaline Phosphatase: 39 U/L (ref 38–126)
Anion gap: 7 (ref 5–15)
BUN: 9 mg/dL (ref 6–20)
CO2: 21 mmol/L — ABNORMAL LOW (ref 22–32)
Calcium: 9 mg/dL (ref 8.9–10.3)
Chloride: 108 mmol/L (ref 98–111)
Creatinine, Ser: 0.71 mg/dL (ref 0.44–1.00)
GFR calc Af Amer: >60 mL/min (ref >60)
GFR calc non Af Amer: >60 mL/min (ref >60)
Glucose, Bld: 77 mg/dL (ref 70–99)
Potassium: 4.2 mmol/L (ref 3.5–5.1)
Sodium: 136 mmol/L (ref 135–145)
Total Bilirubin: 0.3 mg/dL (ref 0.3–1.2)
Total Protein: 7.6 g/dL (ref 6.5–8.1)

## 2019-02-04 LAB — CBC
HCT: 38.8 % (ref 36.0–46.0)
Hemoglobin: 13.1 g/dL (ref 12.0–15.0)
MCH: 28.5 pg (ref 26.0–34.0)
MCHC: 33.8 g/dL (ref 30.0–36.0)
MCV: 84.5 fL (ref 80.0–100.0)
Platelets: 294 10*3/uL (ref 150–400)
RBC: 4.59 MIL/uL (ref 3.87–5.11)
RDW: 13.6 % (ref 11.5–15.5)
WBC: 7.8 10*3/uL (ref 4.0–10.5)
nRBC: 0 % (ref 0.0–0.2)

## 2019-02-04 LAB — RAPID HIV SCREEN (HIV 1/2 AB+AG)
HIV 1/2 Antibodies: NONREACTIVE
HIV-1 P24 Antigen - HIV24: NONREACTIVE
Interpretation (HIV Ag Ab): NONREACTIVE

## 2019-02-04 LAB — HCG, QUANTITATIVE, PREGNANCY: hCG, Beta Chain, Quant, S: <1 m[IU]/mL (ref <5)

## 2019-02-04 LAB — SARS CORONAVIRUS 2 (TAT 6-24 HRS): SARS Coronavirus 2: NEGATIVE

## 2019-02-04 NOTE — Pre-Procedure Instructions (Signed)
In for PAT visit. pateint states, "they always have a very hard time with labs and IV's. I have had multiple PICC's in the past.". Her veins are very small and difficult to access with scar tissue noted particularly in [facility]. Finally obtaine labs on the 3rd attempt. Dr Despina Hidden contacted to see if he wants a PICC line prior to surgery. He asks that we consult anesthesia and see what they feel appropriate. Dr Alva Garnet in to see patient and instructs her that she needs to drink lots of liquids from now until her surgery and that she may have clear liquids up to 0400 the morning of her surgery. He advises that we start IV with a 20 if possible and he can look for a larger vein after she is asleep. Patient verbalizes understanding.

## 2019-02-06 ENCOUNTER — Inpatient Hospital Stay (HOSPITAL_COMMUNITY)
Admission: RE | Admit: 2019-02-06 | Discharge: 2019-02-07 | DRG: 743 | Disposition: A | Discharge status: Home / Self Care | Payer: Medicaid Other | Attending: Obstetrics & Gynecology | Admitting: Obstetrics & Gynecology

## 2019-02-06 ENCOUNTER — Inpatient Hospital Stay (HOSPITAL_COMMUNITY): Payer: Medicaid Other | Admitting: Anesthesiology

## 2019-02-06 ENCOUNTER — Encounter (HOSPITAL_COMMUNITY): Payer: Self-pay | Admitting: Obstetrics & Gynecology

## 2019-02-06 ENCOUNTER — Encounter (HOSPITAL_COMMUNITY)
Admission: RE | Disposition: A | Discharge status: Home / Self Care | Payer: Self-pay | Source: Home / Self Care | Attending: Obstetrics & Gynecology

## 2019-02-06 ENCOUNTER — Other Ambulatory Visit: Payer: Self-pay

## 2019-02-06 DIAGNOSIS — Z91040 Latex allergy status: Secondary | ICD-10-CM | POA: Diagnosis not present

## 2019-02-06 DIAGNOSIS — Z800 Family history of malignant neoplasm of digestive organs: Secondary | ICD-10-CM | POA: Diagnosis not present

## 2019-02-06 DIAGNOSIS — G894 Chronic pain syndrome: Secondary | ICD-10-CM | POA: Diagnosis present

## 2019-02-06 DIAGNOSIS — Z91013 Allergy to seafood: Secondary | ICD-10-CM

## 2019-02-06 DIAGNOSIS — G43909 Migraine, unspecified, not intractable, without status migrainosus: Secondary | ICD-10-CM | POA: Diagnosis present

## 2019-02-06 DIAGNOSIS — Z8249 Family history of ischemic heart disease and other diseases of the circulatory system: Secondary | ICD-10-CM

## 2019-02-06 DIAGNOSIS — Z833 Family history of diabetes mellitus: Secondary | ICD-10-CM | POA: Diagnosis not present

## 2019-02-06 DIAGNOSIS — F419 Anxiety disorder, unspecified: Secondary | ICD-10-CM | POA: Diagnosis present

## 2019-02-06 DIAGNOSIS — G709 Myoneural disorder, unspecified: Secondary | ICD-10-CM | POA: Diagnosis present

## 2019-02-06 DIAGNOSIS — F17210 Nicotine dependence, cigarettes, uncomplicated: Secondary | ICD-10-CM | POA: Diagnosis present

## 2019-02-06 DIAGNOSIS — R102 Pelvic and perineal pain: Secondary | ICD-10-CM | POA: Diagnosis present

## 2019-02-06 DIAGNOSIS — Z886 Allergy status to analgesic agent status: Secondary | ICD-10-CM | POA: Diagnosis not present

## 2019-02-06 DIAGNOSIS — N941 Unspecified dyspareunia: Secondary | ICD-10-CM | POA: Diagnosis present

## 2019-02-06 DIAGNOSIS — G8929 Other chronic pain: Secondary | ICD-10-CM | POA: Diagnosis not present

## 2019-02-06 DIAGNOSIS — Z888 Allergy status to other drugs, medicaments and biological substances status: Secondary | ICD-10-CM | POA: Diagnosis not present

## 2019-02-06 DIAGNOSIS — N946 Dysmenorrhea, unspecified: Secondary | ICD-10-CM | POA: Diagnosis present

## 2019-02-06 DIAGNOSIS — Z90710 Acquired absence of both cervix and uterus: Secondary | ICD-10-CM | POA: Diagnosis present

## 2019-02-06 DIAGNOSIS — M797 Fibromyalgia: Secondary | ICD-10-CM | POA: Diagnosis present

## 2019-02-06 DIAGNOSIS — Z881 Allergy status to other antibiotic agents status: Secondary | ICD-10-CM | POA: Diagnosis not present

## 2019-02-06 DIAGNOSIS — Z9109 Other allergy status, other than to drugs and biological substances: Secondary | ICD-10-CM | POA: Diagnosis not present

## 2019-02-06 DIAGNOSIS — Z20822 Contact with and (suspected) exposure to covid-19: Secondary | ICD-10-CM | POA: Diagnosis present

## 2019-02-06 DIAGNOSIS — Z90722 Acquired absence of ovaries, bilateral: Secondary | ICD-10-CM

## 2019-02-06 DIAGNOSIS — J45909 Unspecified asthma, uncomplicated: Secondary | ICD-10-CM | POA: Diagnosis present

## 2019-02-06 DIAGNOSIS — I10 Essential (primary) hypertension: Secondary | ICD-10-CM | POA: Diagnosis present

## 2019-02-06 DIAGNOSIS — N938 Other specified abnormal uterine and vaginal bleeding: Secondary | ICD-10-CM | POA: Diagnosis present

## 2019-02-06 HISTORY — PX: HYSTERECTOMY ABDOMINAL WITH SALPINGO-OOPHORECTOMY: SHX6792

## 2019-02-06 SURGERY — HYSTERECTOMY, ABDOMINAL, WITH SALPINGO-OOPHORECTOMY
Anesthesia: General | Site: Abdomen | Laterality: Bilateral

## 2019-02-06 MED ORDER — SUCCINYLCHOLINE CHLORIDE 200 MG/10ML IV SOSY
PREFILLED_SYRINGE | INTRAVENOUS | Status: DC | PRN
  Administered 2019-02-06: 140 mg via INTRAVENOUS

## 2019-02-06 MED ORDER — ALUM & MAG HYDROXIDE-SIMETH 200-200-20 MG/5ML PO SUSP: 30.0000 mL | ORAL | Status: DC | PRN

## 2019-02-06 MED ORDER — DULOXETINE HCL 60 MG PO CPEP
60.0000 mg | ORAL_CAPSULE | Freq: Every day | ORAL | Status: DC
  Administered 2019-02-06 – 2019-02-07 (×2): 60 mg via ORAL
  Filled 2019-02-06 (×2): qty 1

## 2019-02-06 MED ORDER — ROCURONIUM BROMIDE 100 MG/10ML IV SOLN
INTRAVENOUS | Status: DC | PRN
  Administered 2019-02-06: 10 mg via INTRAVENOUS
  Administered 2019-02-06: 5 mg via INTRAVENOUS
  Administered 2019-02-06: 30 mg via INTRAVENOUS

## 2019-02-06 MED ORDER — SUCCINYLCHOLINE CHLORIDE 200 MG/10ML IV SOSY
PREFILLED_SYRINGE | INTRAVENOUS | Status: AC
  Filled 2019-02-06: qty 10

## 2019-02-06 MED ORDER — BUPIVACAINE LIPOSOME 1.3 % IJ SUSP
INTRAMUSCULAR | Status: AC
  Filled 2019-02-06: qty 20

## 2019-02-06 MED ORDER — PREGABALIN 75 MG PO CAPS
150.0000 mg | ORAL_CAPSULE | Freq: Two times a day (BID) | ORAL | Status: DC
  Administered 2019-02-06 – 2019-02-07 (×3): 150 mg via ORAL
  Filled 2019-02-06 (×3): qty 2

## 2019-02-06 MED ORDER — MIDAZOLAM HCL 2 MG/2ML IJ SOLN
2.0000 mg | Freq: Once | INTRAMUSCULAR | Status: AC
  Administered 2019-02-06: 2 mg via INTRAVENOUS
  Filled 2019-02-06: qty 2

## 2019-02-06 MED ORDER — MELOXICAM 7.5 MG PO TABS
15.0000 mg | ORAL_TABLET | Freq: Every day | ORAL | Status: DC
  Administered 2019-02-06 – 2019-02-07 (×2): 15 mg via ORAL
  Filled 2019-02-06 (×2): qty 2

## 2019-02-06 MED ORDER — ZOLPIDEM TARTRATE 5 MG PO TABS
5.0000 mg | ORAL_TABLET | Freq: Every evening | ORAL | Status: DC | PRN
  Administered 2019-02-06: 20:00:00 5 mg via ORAL
  Filled 2019-02-06: qty 1

## 2019-02-06 MED ORDER — LIDOCAINE HCL (CARDIAC) PF 100 MG/5ML IV SOSY
PREFILLED_SYRINGE | INTRAVENOUS | Status: DC | PRN
  Administered 2019-02-06: 80 mg via INTRAVENOUS

## 2019-02-06 MED ORDER — ROCURONIUM BROMIDE 10 MG/ML (PF) SYRINGE
PREFILLED_SYRINGE | INTRAVENOUS | Status: AC
  Filled 2019-02-06: qty 20

## 2019-02-06 MED ORDER — HEMOSTATIC AGENTS (NO CHARGE) OPTIME
TOPICAL | Status: DC | PRN
  Administered 2019-02-06: 1 via TOPICAL

## 2019-02-06 MED ORDER — OXYCODONE HCL 5 MG PO TABS
5.0000 mg | ORAL_TABLET | ORAL | Status: DC | PRN
  Administered 2019-02-06 – 2019-02-07 (×3): 10 mg via ORAL
  Filled 2019-02-06 (×3): qty 2

## 2019-02-06 MED ORDER — LACTATED RINGERS IV SOLN: INTRAVENOUS | Status: DC | PRN

## 2019-02-06 MED ORDER — LACTATED RINGERS IV SOLN
INTRAVENOUS | Status: DC
  Administered 2019-02-06: 1000 mL via INTRAVENOUS

## 2019-02-06 MED ORDER — CEFAZOLIN SODIUM-DEXTROSE 2-4 GM/100ML-% IV SOLN
2.0000 g | INTRAVENOUS | Status: AC
  Administered 2019-02-06: 09:00:00 2 g via INTRAVENOUS
  Filled 2019-02-06: qty 100

## 2019-02-06 MED ORDER — SODIUM CHLORIDE 0.9 % IR SOLN
Status: DC | PRN
  Administered 2019-02-06 (×3): 1000 mL

## 2019-02-06 MED ORDER — FENTANYL CITRATE (PF) 250 MCG/5ML IJ SOLN
INTRAMUSCULAR | Status: AC
  Filled 2019-02-06: qty 5

## 2019-02-06 MED ORDER — DIPHENHYDRAMINE HCL 50 MG/ML IJ SOLN
25.0000 mg | Freq: Four times a day (QID) | INTRAMUSCULAR | Status: DC | PRN
  Administered 2019-02-06 – 2019-02-07 (×3): 50 mg via INTRAVENOUS
  Filled 2019-02-06 (×3): qty 1

## 2019-02-06 MED ORDER — DEXAMETHASONE SODIUM PHOSPHATE 10 MG/ML IJ SOLN
INTRAMUSCULAR | Status: AC
  Filled 2019-02-06: qty 1

## 2019-02-06 MED ORDER — ONDANSETRON HCL 4 MG/2ML IJ SOLN
INTRAMUSCULAR | Status: AC
  Filled 2019-02-06: qty 2

## 2019-02-06 MED ORDER — SUGAMMADEX SODIUM 200 MG/2ML IV SOLN
INTRAVENOUS | Status: DC | PRN
  Administered 2019-02-06: 160 mg via INTRAVENOUS

## 2019-02-06 MED ORDER — BISACODYL 10 MG RE SUPP: 10.0000 mg | Freq: Every day | RECTAL | Status: DC | PRN

## 2019-02-06 MED ORDER — DIPHENHYDRAMINE HCL 50 MG/ML IJ SOLN
INTRAMUSCULAR | Status: AC
  Filled 2019-02-06: qty 1

## 2019-02-06 MED ORDER — CYCLOBENZAPRINE HCL 10 MG PO TABS
10.0000 mg | ORAL_TABLET | Freq: Three times a day (TID) | ORAL | Status: DC | PRN
  Administered 2019-02-06 – 2019-02-07 (×2): 10 mg via ORAL
  Filled 2019-02-06 (×2): qty 1

## 2019-02-06 MED ORDER — LIDOCAINE 2% (20 MG/ML) 5 ML SYRINGE
INTRAMUSCULAR | Status: AC
  Filled 2019-02-06: qty 5

## 2019-02-06 MED ORDER — HYDROXYZINE HCL 25 MG PO TABS
25.0000 mg | ORAL_TABLET | Freq: Three times a day (TID) | ORAL | Status: DC
  Administered 2019-02-06 – 2019-02-07 (×3): 25 mg via ORAL
  Filled 2019-02-06 (×3): qty 1

## 2019-02-06 MED ORDER — ACETAMINOPHEN 10 MG/ML IV SOLN
INTRAVENOUS | Status: AC
  Filled 2019-02-06: qty 100

## 2019-02-06 MED ORDER — DOCUSATE SODIUM 100 MG PO CAPS
100.0000 mg | ORAL_CAPSULE | Freq: Two times a day (BID) | ORAL | Status: DC
  Administered 2019-02-06 – 2019-02-07 (×3): 100 mg via ORAL
  Filled 2019-02-06 (×3): qty 1

## 2019-02-06 MED ORDER — BUPIVACAINE LIPOSOME 1.3 % IJ SUSP
20.0000 mL | Freq: Once | INTRAMUSCULAR | Status: DC
  Filled 2019-02-06: qty 20

## 2019-02-06 MED ORDER — ACETAMINOPHEN 10 MG/ML IV SOLN
INTRAVENOUS | Status: DC | PRN
  Administered 2019-02-06: 1000 mg via INTRAVENOUS

## 2019-02-06 MED ORDER — SODIUM CHLORIDE (PF) 0.9 % IJ SOLN
INTRAMUSCULAR | Status: AC
  Filled 2019-02-06: qty 10

## 2019-02-06 MED ORDER — MEPERIDINE HCL 50 MG/ML IJ SOLN: 6.2500 mg | INTRAMUSCULAR | Status: DC | PRN

## 2019-02-06 MED ORDER — FENTANYL CITRATE (PF) 100 MCG/2ML IJ SOLN
INTRAMUSCULAR | Status: DC | PRN
  Administered 2019-02-06 (×8): 50 ug via INTRAVENOUS

## 2019-02-06 MED ORDER — HYDROMORPHONE HCL 1 MG/ML IJ SOLN
0.2500 mg | INTRAMUSCULAR | Status: AC | PRN
  Administered 2019-02-06 (×4): 0.5 mg via INTRAVENOUS
  Filled 2019-02-06 (×4): qty 0.5

## 2019-02-06 MED ORDER — SENNOSIDES-DOCUSATE SODIUM 8.6-50 MG PO TABS: 1.0000 | ORAL_TABLET | Freq: Every evening | ORAL | Status: DC | PRN

## 2019-02-06 MED ORDER — DEXAMETHASONE SODIUM PHOSPHATE 4 MG/ML IJ SOLN
INTRAMUSCULAR | Status: DC | PRN
  Administered 2019-02-06: 10 mg via INTRAVENOUS

## 2019-02-06 MED ORDER — SCOPOLAMINE 1 MG/3DAYS TD PT72
1.0000 | MEDICATED_PATCH | Freq: Once | TRANSDERMAL | Status: DC
  Administered 2019-02-06: 09:00:00 1.5 mg via TRANSDERMAL
  Filled 2019-02-06: qty 1

## 2019-02-06 MED ORDER — MIDAZOLAM HCL 2 MG/2ML IJ SOLN
INTRAMUSCULAR | Status: AC
  Filled 2019-02-06: qty 2

## 2019-02-06 MED ORDER — SODIUM CHLORIDE 0.9 % IV SOLN
INTRAVENOUS | Status: DC | PRN
  Administered 2019-02-06: 40 mL

## 2019-02-06 MED ORDER — PROMETHAZINE HCL 25 MG/ML IJ SOLN
25.0000 mg | Freq: Four times a day (QID) | INTRAMUSCULAR | Status: DC | PRN
  Administered 2019-02-06 – 2019-02-07 (×3): 25 mg via INTRAVENOUS
  Filled 2019-02-06 (×3): qty 1

## 2019-02-06 MED ORDER — METOPROLOL TARTRATE 5 MG/5ML IV SOLN
INTRAVENOUS | Status: AC
  Filled 2019-02-06: qty 5

## 2019-02-06 MED ORDER — FENTANYL CITRATE (PF) 100 MCG/2ML IJ SOLN
50.0000 ug | INTRAMUSCULAR | Status: DC | PRN
  Administered 2019-02-06: 50 ug via INTRAVENOUS
  Administered 2019-02-06 (×2): 100 ug via INTRAVENOUS
  Administered 2019-02-06: 50 ug via INTRAVENOUS
  Administered 2019-02-07: 03:00:00 100 ug via INTRAVENOUS
  Filled 2019-02-06 (×5): qty 2

## 2019-02-06 MED ORDER — BUPIVACAINE LIPOSOME 1.3 % IJ SUSP: INTRAMUSCULAR | Status: DC | PRN

## 2019-02-06 MED ORDER — KCL IN DEXTROSE-NACL 20-5-0.45 MEQ/L-%-% IV SOLN: INTRAVENOUS | Status: AC

## 2019-02-06 MED ORDER — PROMETHAZINE HCL 25 MG/ML IJ SOLN
6.2500 mg | INTRAMUSCULAR | Status: DC | PRN
  Administered 2019-02-06: 12.5 mg via INTRAVENOUS
  Filled 2019-02-06: qty 1

## 2019-02-06 MED ORDER — METOPROLOL TARTRATE 5 MG/5ML IV SOLN
2.5000 mg | Freq: Once | INTRAVENOUS | Status: AC
  Administered 2019-02-06: 2.5 mg via INTRAVENOUS

## 2019-02-06 MED ORDER — ROCURONIUM BROMIDE 10 MG/ML (PF) SYRINGE
PREFILLED_SYRINGE | INTRAVENOUS | Status: AC
  Filled 2019-02-06: qty 10

## 2019-02-06 MED ORDER — PROPOFOL 10 MG/ML IV BOLUS
INTRAVENOUS | Status: DC | PRN
  Administered 2019-02-06: 180 mg via INTRAVENOUS

## 2019-02-06 MED ORDER — DIPHENHYDRAMINE HCL 50 MG/ML IJ SOLN
INTRAMUSCULAR | Status: DC | PRN
  Administered 2019-02-06: 50 mg via INTRAVENOUS

## 2019-02-06 MED ORDER — TIZANIDINE HCL 4 MG PO TABS
4.0000 mg | ORAL_TABLET | Freq: Three times a day (TID) | ORAL | Status: DC | PRN
  Administered 2019-02-06 – 2019-02-07 (×2): 4 mg via ORAL
  Filled 2019-02-06 (×2): qty 1

## 2019-02-06 MED ORDER — PROPOFOL 10 MG/ML IV BOLUS
INTRAVENOUS | Status: AC
  Filled 2019-02-06: qty 40

## 2019-02-06 MED ORDER — ENOXAPARIN SODIUM 40 MG/0.4ML ~~LOC~~ SOLN
40.0000 mg | SUBCUTANEOUS | Status: DC
  Administered 2019-02-07: 09:00:00 40 mg via SUBCUTANEOUS
  Filled 2019-02-06: qty 0.4

## 2019-02-06 SURGICAL SUPPLY — 51 items
ADH SKN CLS APL DERMABOND .7 (GAUZE/BANDAGES/DRESSINGS) ×1
APL SWBSTK 6 STRL LF DISP (MISCELLANEOUS) ×1
APPLICATOR COTTON TIP 6 STRL (MISCELLANEOUS) IMPLANT
APPLICATOR COTTON TIP 6IN STRL (MISCELLANEOUS) ×3
BRUSH SCRUB SURG 4.25 DISP (MISCELLANEOUS) ×2 IMPLANT
CLOTH BEACON ORANGE TIMEOUT ST (SAFETY) ×3 IMPLANT
COVER LIGHT HANDLE STERIS (MISCELLANEOUS) ×6 IMPLANT
COVER WAND RF STERILE (DRAPES) ×3 IMPLANT
DERMABOND ADVANCED (GAUZE/BANDAGES/DRESSINGS) ×2
DERMABOND ADVANCED .7 DNX12 (GAUZE/BANDAGES/DRESSINGS) ×1 IMPLANT
DRAPE WARM FLUID 44X44 (DRAPES) ×3 IMPLANT
DRSG OPSITE POSTOP 4X8 (GAUZE/BANDAGES/DRESSINGS) ×3 IMPLANT
ELECT BLADE 6 FLAT ULTRCLN (ELECTRODE) ×2 IMPLANT
ELECT REM PT RETURN 9FT ADLT (ELECTROSURGICAL) ×3
ELECTRODE REM PT RTRN 9FT ADLT (ELECTROSURGICAL) ×1 IMPLANT
GAUZE 4X4 16PLY RFD (DISPOSABLE) ×3 IMPLANT
GAUZE SPONGE 4X4 12PLY STRL (GAUZE/BANDAGES/DRESSINGS) ×2 IMPLANT
GLOVE BIOGEL PI IND STRL 7.0 (GLOVE) ×4 IMPLANT
GLOVE BIOGEL PI IND STRL 8 (GLOVE) ×1 IMPLANT
GLOVE BIOGEL PI INDICATOR 7.0 (GLOVE) ×8
GLOVE BIOGEL PI INDICATOR 8 (GLOVE) ×2
GLOVE SURG SS PI 7.0 STRL IVOR (GLOVE) ×4 IMPLANT
GLOVE SURG SS PI 8.0 STRL IVOR (GLOVE) ×2 IMPLANT
GOWN STRL REUS W/TWL LRG LVL3 (GOWN DISPOSABLE) ×6 IMPLANT
GOWN STRL REUS W/TWL XL LVL3 (GOWN DISPOSABLE) ×3 IMPLANT
HEMOSTAT ARISTA ABSORB 3G PWDR (HEMOSTASIS) ×2 IMPLANT
INST SET MAJOR GENERAL (KITS) ×3 IMPLANT
KIT BLADEGUARD II DBL (SET/KITS/TRAYS/PACK) ×2 IMPLANT
KIT TURNOVER KIT A (KITS) ×3 IMPLANT
MANIFOLD NEPTUNE II (INSTRUMENTS) ×3 IMPLANT
NDL HYPO 18GX1.5 BLUNT FILL (NEEDLE) IMPLANT
NDL HYPO 21X1.5 SAFETY (NEEDLE) ×1 IMPLANT
NEEDLE HYPO 18GX1.5 BLUNT FILL (NEEDLE) ×6 IMPLANT
NEEDLE HYPO 21X1.5 SAFETY (NEEDLE) ×3 IMPLANT
NS IRRIG 1000ML POUR BTL (IV SOLUTION) ×8 IMPLANT
PACK ABDOMINAL MAJOR (CUSTOM PROCEDURE TRAY) ×3 IMPLANT
PAD ARMBOARD 7.5X6 YLW CONV (MISCELLANEOUS) ×3 IMPLANT
RETRACTOR WOUND ALXS 18CM MED (MISCELLANEOUS) IMPLANT
RTRCTR WOUND ALEXIS O 18CM MED (MISCELLANEOUS) ×3
SET BASIN LINEN APH (SET/KITS/TRAYS/PACK) ×3 IMPLANT
SPONGE LAP 18X18 RF (DISPOSABLE) ×6 IMPLANT
SUT CHROMIC 0 CT 1 (SUTURE) ×3 IMPLANT
SUT MON AB 3-0 SH 27 (SUTURE) ×9 IMPLANT
SUT VIC AB 0 CT1 27 (SUTURE) ×9
SUT VIC AB 0 CT1 27XCR 8 STRN (SUTURE) ×2 IMPLANT
SUT VIC AB 0 CTX 36 (SUTURE) ×3
SUT VIC AB 0 CTX36XBRD ANTBCTR (SUTURE) ×1 IMPLANT
SYR 20ML LL LF (SYRINGE) ×7 IMPLANT
SYR BULB IRRIGATION 50ML (SYRINGE) ×2 IMPLANT
TOWEL SURG RFD BLUE STRL DISP (DISPOSABLE) ×3 IMPLANT
TRAY FOLEY MTR SLVR 16FR STAT (SET/KITS/TRAYS/PACK) ×3 IMPLANT

## 2019-02-06 NOTE — Op Note (Signed)
Preoperative diagnosis:  1.  DUB, unresponsive to aggressive hormonal management                                          2.  Dysmenorrhea                                         3.  Dyspareunia, bump, cervical origin                                         4.  Chronic pelvic pain, lateral, bilateral  Postoperative diagnosis:  Same as above   Procedure:  TAH BSO  Surgeon:  Florian Buff  Assistant:    Anesthesia:  General endotracheal  Preoperative clinical summary:  Tanya Krause is a 34 y.o. [id number] with No LMP recorded. admitted for a TAH BSO.   12/8 visit subjective: Pt was seen in ED yesterday at [facility] for RUQ/epigastric pain Evaluation was largely unremarkable She also is stating she was seen at [facility] in ED and treated for UTI/kidney infection and was given antibiotic and NSAID it appears, I was unable to find a record of it in care everywhere She is seen here for evaluation of menorrhagia dysmenorrhea, that pre dated her visits over the past week She uses towels and soils clothes and sheets Changes pads 4-5 times per hour Her periods have been like this for 1 year No pain with intercourse Uses a heating pad Gets nauseated and throws up with each cycle when she bleeds not before  Managed with course of megestrol + NSAIDs which was not effective in controlling her bleeding pain or dyspareunia.  Sonogram was otherwise normal  With her non response to aggressive hormonal management, the only solution for all of her gyn issues is a TAH BSO.  Of course patient is aware of the negatives of surgical menopause but feels that set of negatives is less than what she is dealing with.  We discussed for 15 minutes the pluses and minuses and she will proceed with TAH BSO.   Intraoperative findings: adhesions of the bladder to the anterior lower uterine segment, otherwise no abnormalities are seen  Description of operation:  Patient was taken to the operating room and placed in the  supine position where she underwent general endotracheal anesthesia.  She was then prepped and draped in the usual sterile fashion and a Foley catheter was placed for continuous bladder drainage.  A Pfannenstiel skin incision was made and carried down sharply to the rectus fascia which was scored in the midline and extended laterally.  The fascia was taken off the muscles superiorly and inferiorly without difficulty.  The muscles were divided.  The peritoneal cavity was entered.  An medium Alexis self-retaining retractor was placed.  The upper abdomen was packed away. Both uterine cornu were grasped with Coker clamps.  The left round ligament was suture ligated and coagulated with the electrocautery unit.  The left vesicouterine serosal flap was created.  An avascular window in in the peritoneum was created and the utero-ovarian ligament was cross clamped, cut and suture ligated.  The right round ligament was suture ligated and cut with the electrocautery unit.  The vesicouterine serosal flap on the right was created.  An avascular window in the peritoneum was created and the right utero-ovarian ligament was cross clamped, cut and double suture ligated.  The infundibulopelvic ligaments were then crossclamped bilaterally and double suture ligated.  Both were 4 and aft sutures with good resulting hemostasis.   the uterine vessels were skeletonized bilaterally.  The uterine vessels were clamped bilaterally,  then cut and suture ligated.  Two more pedicles were taken down the cervix medial to the uterine vessels.  Each pedicle was clamped cut and suture ligated with good resulting hemostasis.  The cervix was then appropriate for removal in the vagina was crossclamped.  The cervix was removed sharply.  Vaginal angle sutures were placed bilaterally.  Interrupted figure-of-eight sutures were used to close the vagina hemostatically and anatomically.   the pelvis was irrigated vigorously and all pedicles were examined and  found to be hemostatic.  The pelvic peritoneum was closed completely.  Arista was sprinkled into the pelvis.   all specimens were sent to pathology for routine evaluation.  The Alexis self-retaining retractor was removed and the pelvis was irrigated vigorously.  All packs were removed and all counts were correct at this point x 3.  The muscles and peritoneum were reapproximated loosely.  The fascia was closed with 0 Vicryl running.  266 mg of Exparel rel was injected into the incision for postoperative pain modulation.   the skin was closed using 3-0 Vicryl on a Keith needle in a subcuticular fashion.  Dermabond was then applied for additional wound integrity and to serve as a postoperative bacterial barrier.  The patient was awakened from anesthesia taken to the recovery room in good stable condition. All sponge instrument and needle counts were correct x 3.  The patient received Ancef and Toradol prophylactically preoperatively.  Estimated blood loss for the procedure was 100  cc.  Lazaro Arms, MD  02/06/2019 11:30 AM

## 2019-02-06 NOTE — Progress Notes (Signed)
Dr Alva Garnet at bedside to check pt. HR remains 118. Order given for lopressor.

## 2019-02-06 NOTE — Progress Notes (Signed)
Dr Alva Garnet at bedside to check pt. Pt remains sinus tachycardia. Order given for 500 ml LR bolus. Bolus started via IV pump.

## 2019-02-06 NOTE — Progress Notes (Signed)
Morrie Sheldon (cousin), notified of room #316. Voiced understanding.

## 2019-02-06 NOTE — Progress Notes (Signed)
Dr Alva Garnet at bedside to check pt. HR 108. Order given to repeat lopressor. Cleared for d/c to room.

## 2019-02-06 NOTE — Progress Notes (Signed)
Per request of patient I pulled her foley early (there was an order to remove at 2000 today) because she stated that the foley was causing a lot of pressure and pain and she just wanted it out. She walked to the bathroom and voided.

## 2019-02-06 NOTE — Transfer of Care (Signed)
Immediate Anesthesia Transfer of Care Note  Patient: Tanya Krause  Procedure(s) Performed: HYSTERECTOMY ABDOMINAL WITH BILATERAL SALPINGO-OOPHORECTOMY (Bilateral Abdomen)  Patient Location: PACU  Anesthesia Type:General  Level of Consciousness: awake, alert  and patient cooperative  Airway & Oxygen Therapy: Patient Spontanous Breathing and Patient connected to nasal cannula oxygen  Post-op Assessment: Report given to RN and Post -op Vital signs reviewed and stable  Post vital signs: Reviewed and stable  Last Vitals:  Vitals Value Taken Time  BP 129/91 02/06/19 1154  Temp    Pulse 111 02/06/19 1157  Resp 13 02/06/19 1157  SpO2 97 % 02/06/19 1157  Vitals shown include unvalidated device data.  Last Pain:  Vitals:   02/06/19 0834  TempSrc: Oral  PainSc: 8       Patients Stated Pain Goal: 8 (02/06/19 0834)  Complications: No apparent anesthesia complications

## 2019-02-06 NOTE — Progress Notes (Signed)
Dr Alva Garnet at bedside to check pt. 500 ml LR bolus completed. LR infusing without difficulty. OK to proceed with additional pain med. No new orders given.

## 2019-02-06 NOTE — H&P (Signed)
Preoperative History and Physical  Tanya Krause is a 34 y.o. [id number] with No LMP recorded. admitted for a TAH BSO.   12/8 visit subjective: Pt was seen in ED yesterday at [facility] for RUQ/epigastric pain Evaluation was largely unremarkable She also is stating she was seen at [facility] in ED and treated for UTI/kidney infection and was given antibiotic and NSAID it appears, I was unable to find a record of it in care everywhere She is seen here for evaluation of menorrhagia dysmenorrhea, that pre dated her visits over the past week She uses towels and soils clothes and sheets Changes pads 4-5 times per hour Her periods have been like this for 1 year No pain with intercourse Uses a heating pad Gets nauseated and throws up with each cycle when she bleeds not before  Managed with course of megestrol + NSAIDs which was not effective in controlling her bleeding pain or dyspareunia.  Sonogram was otherwise normal  With her non response to aggressive hormonal management, the only solution for all of her gyn issues is a TAH BSO.  Of course patient is aware of the negatives of surgical menopause but feels that set of negatives is less than what she is dealing with.  We discussed for 15 minutes the pluses and minuses and she will proceed with TAH BSO.  PMH:    Past Medical History:  Diagnosis Date  . Asthma   . Chronic abdominal pain   . Chronic migraine   . Chronic pain syndrome   . Fibromyalgia   . 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 Hypertension   . Migraine   . Pelvic inflammatory disease (PID)     PSH:     Past Surgical History:  Procedure Laterality Date  . kidney infections    . TUBAL LIGATION    . WISDOM TOOTH EXTRACTION      POb/GynH:      OB History    Gravida  4   Para  4   Term  1   Preterm  3   AB      Living  4     SAB      TAB      Ectopic      Multiple      Live Births  4           SH:   Social History   Tobacco Use  . Smoking status:  Current Some Day Smoker    Packs/day: 0.14    Years: 5.00    Pack years: 0.70    Types: Cigarettes  . Smokeless tobacco: Never Used  . Tobacco comment: 3 cig daily  Substance Use Topics  . Alcohol use: Not Currently  . Drug use: Yes    Types: Marijuana    Comment: last used september 2020    FH:    Family History  Problem Relation Age of Onset  . Hypertension Mother   . Diabetes Mother   . Heart failure Father   . Colon cancer Paternal Grandfather      Allergies:  Allergies  Allergen Reactions  . Metoclopramide Itching and Other (See Comments)    "makes me feel jittery"  . Tomato Shortness Of Breath, Swelling and Rash  . Aspirin Hives  . Doxycycline Nausea And Vomiting  . Hydromorphone Hcl Itching    Patient can take but has to have benadryl with it (Dilaudid)  . Ibuprofen Hives  . Ondansetron Nausea And Vomiting  Stomach pain.  . Other Hives    [place]ona[place]  . Shellfish Allergy Hives  . Toradol [Ketorolac Tromethamine] Hives  . Latex Itching and Rash    Vaginal Only  . Tramadol Rash    Medications:       Current Facility-Administered Medications:  .  bupivacaine liposome (EXPAREL) 1.3 % injection 266 mg, 20 mL, Infiltration, Once, Jionni Helming H, MD .  ceFAZolin (ANCEF) IVPB 2g/100 mL premix, 2 g, Intravenous, On Call to OR, Lazaro Arms, MD .  lactated ringers infusion, , Intravenous, Continuous, Battula, Rajamani C, MD, Last Rate: 100 mL/hr at 02/06/19 0901, 1,000 mL at 02/06/19 0901 .  scopolamine (TRANSDERM-SCOP) 1 MG/3DAYS 1.5 mg, 1 patch, Transdermal, Once, Battula, Rajamani C, MD  Review of Systems:   Review of Systems  Constitutional: Negative for fever, chills, weight loss, malaise/fatigue and diaphoresis.  HENT: Negative for hearing loss, ear pain, nosebleeds, congestion, sore throat, neck pain, tinnitus and ear discharge.   Eyes: Negative for blurred vision, double vision, photophobia, pain, discharge and redness.  Respiratory: Negative for  cough, hemoptysis, sputum production, shortness of breath, wheezing and stridor.   Cardiovascular: Negative for chest pain, palpitations, orthopnea, claudication, leg swelling and PND.  Gastrointestinal: Positive for abdominal pain. Negative for heartburn, nausea, vomiting, diarrhea, constipation, blood in stool and melena.  Genitourinary: Negative for dysuria, urgency, frequency, hematuria and flank pain.  Musculoskeletal: Negative for myalgias, back pain, joint pain and falls.  Skin: Negative for itching and rash.  Neurological: Negative for dizziness, tingling, tremors, sensory change, speech change, focal weakness, seizures, loss of consciousness, weakness and headaches.  Endo/Heme/Allergies: Negative for environmental allergies and polydipsia. Does not bruise/bleed easily.  Psychiatric/Behavioral: Negative for depression, suicidal ideas, hallucinations, memory loss and substance abuse. The patient is not nervous/anxious and does not have insomnia.      PHYSICAL EXAM:  Pulse (!) (P) 108, temperature (P) 98.6 F (37 C), temperature source (P) Oral, resp. rate (P) 20, SpO2 (P) 100 %.    Vitals reviewed. Constitutional: She is oriented to person, place, and time. She appears well-developed and well-nourished.  HENT:  Head: Normocephalic and atraumatic.  Right Ear: External ear normal.  Left Ear: External ear normal.  Nose: Nose normal.  Mouth/Throat: Oropharynx is clear and moist.  Eyes: Conjunctivae and EOM are normal. Pupils are equal, round, and reactive to light. Right eye exhibits no discharge. Left eye exhibits no discharge. No scleral icterus.  Neck: Normal range of motion. Neck supple. No tracheal deviation present. No thyromegaly present.  Cardiovascular: Normal rate, regular rhythm, normal heart sounds and intact distal pulses.  Exam reveals no gallop and no friction rub.   No murmur heard. Respiratory: Effort normal and breath sounds normal. No respiratory distress. She has  no wheezes. She has no rales. She exhibits no tenderness.  GI: Soft. Bowel sounds are normal. She exhibits no distension and no mass. There is tenderness. There is no rebound and no guarding.  Genitourinary:       Vulva is normal without lesions Vagina is pink moist without discharge Cervix normal in appearance and pap is normal +pain with slight tipping, this is the source of her dysaprunia Uterus is normal size, contour, position, consistency, mobility, non-tender Adnexa is negative with normal sized ovaries by sonogram  Musculoskeletal: Normal range of motion. She exhibits no edema and no tenderness.  Neurological: She is alert and oriented to person, place, and time. She has normal reflexes. She displays normal reflexes. No cranial nerve deficit. She  exhibits normal muscle tone. Coordination normal.  Skin: Skin is warm and dry. No rash noted. No erythema. No pallor.  Psychiatric: She has a normal mood and affect. Her behavior is normal. Judgment and thought content normal.    Labs: Results for orders placed or performed during the hospital encounter of 02/04/19 (from the past 336 hour(s))  CBC   Collection Time: 02/04/19 11:58 AM  Result Value Ref Range   WBC 7.8 4.0 - 10.5 K/uL   RBC 4.59 3.87 - 5.11 MIL/uL   Hemoglobin 13.1 12.0 - 15.0 g/dL   HCT 35.5 73.2 - 20.2 %   MCV 84.5 80.0 - 100.0 fL   MCH 28.5 26.0 - 34.0 pg   MCHC 33.8 30.0 - 36.0 g/dL   RDW 54.2 70.6 - 23.7 %   Platelets 294 150 - 400 K/uL   nRBC 0.0 0.0 - 0.2 %  Comprehensive metabolic panel   Collection Time: 02/04/19 11:58 AM  Result Value Ref Range   Sodium 136 135 - 145 mmol/L   Potassium 4.2 3.5 - 5.1 mmol/L   Chloride 108 98 - 111 mmol/L   CO2 21 (L) 22 - 32 mmol/L   Glucose, Bld 77 70 - 99 mg/dL   BUN 9 6 - 20 mg/dL   Creatinine, Ser 6.28 0.44 - 1.00 mg/dL   Calcium 9.0 8.9 - 31.5 mg/dL   Total Protein 7.6 6.5 - 8.1 g/dL   Albumin 4.2 3.5 - 5.0 g/dL   AST 19 15 - 41 U/L   ALT 12 0 - 44 U/L    Alkaline Phosphatase 39 38 - 126 U/L   Total Bilirubin 0.3 0.3 - 1.2 mg/dL   GFR calc non Af Amer >60 >60 mL/min   GFR calc Af Amer >60 >60 mL/min   Anion gap 7 5 - 15  hCG, quantitative, pregnancy   Collection Time: 02/04/19 11:58 AM  Result Value Ref Range   hCG, Beta Chain, Quant, S <1 <5 mIU/mL  Rapid HIV screen (HIV 1/2 Ab+Ag)   Collection Time: 02/04/19 11:58 AM  Result Value Ref Range   HIV-1 P24 Antigen - HIV24 NON REACTIVE NON REACTIVE   HIV 1/2 Antibodies NON REACTIVE NON REACTIVE   Interpretation (HIV Ag Ab) NON REACTIVE   Type and screen   Collection Time: 02/04/19 11:58 AM  Result Value Ref Range   ABO/RH(D) B POS    Antibody Screen NEG    Sample Expiration 02/18/2019,2359    Extend sample reason      NO TRANSFUSIONS OR PREGNANCY IN THE PAST 3 MONTHS Performed at [facility], [address] [place], [place] [address]   Results for orders placed or performed during the hospital encounter of 02/04/19 (from the past 336 hour(s))  SARS CORONAVIRUS 2 (TAT 6-24 HRS) Nasopharyngeal Nasopharyngeal Swab   Collection Time: 02/04/19  7:33 AM   Specimen: Nasopharyngeal Swab  Result Value Ref Range   SARS Coronavirus 2 NEGATIVE NEGATIVE  Results for orders placed or performed in visit on 01/23/19 (from the past 336 hour(s))  CBC with Differential   Collection Time: 01/23/19 11:55 AM  Result Value Ref Range   WBC 11.8 (H) 3.4 - 10.8 x10E3/uL   RBC 4.69 3.77 - 5.28 x10E6/uL   Hemoglobin 13.3 11.1 - 15.9 g/dL   Hematocrit 07.3 71.0 - 46.6 %   MCV 85 79 - 97 fL   MCH 28.4 26.6 - 33.0 pg   MCHC 33.3 31.5 - 35.7 g/dL   RDW 62.6  11.7 - 15.4 %   Platelets 349 150 - 450 x10E3/uL   Neutrophils 50 Not Estab. %   Lymphs 39 Not Estab. %   Monocytes 6 Not Estab. %   Eos 3 Not Estab. %   Basos 1 Not Estab. %   Neutrophils Absolute 6.1 1.4 - 7.0 x10E3/uL   Lymphocytes Absolute 4.6 (H) 0.7 - 3.1 x10E3/uL   Monocytes Absolute 0.7 0.1 - 0.9 x10E3/uL   EOS (ABSOLUTE) 0.3 0.0 - 0.4  x10E3/uL   Basophils Absolute 0.1 0.0 - 0.2 x10E3/uL   Immature Granulocytes 1 Not Estab. %   Immature Grans (Abs) 0.1 0.0 - 0.1 x10E3/uL    EKG: Orders placed or performed during the hospital encounter of 07/28/17  . ED EKG  . ED EKG  . EKG 12-Lead  . EKG 12-Lead  . EKG    Imaging Studies: US Transvaginal Non-OB  Result Date: 01/22/2019 GYNECOLOGIC SONOGRAM Tanya Krause is a 34 y.o. [id number] LMP 10/04/2018,she is here for a pelvic sonogram for menorrhagia/dysmenorrhea. Uterus                      7.2 x 4 x 4.7 cm, Total uterine volume 70 cc, homogeneous anteverted uterus,wnl Endometrium          15 mm, symmetrical, difficult to see boarders of endometrium,trace of simple fluid within the endometrium Right ovary             3.1 x 2.3 x 3 cm, wnl Left ovary                2.6 x 2 x 2.3 cm, wnl Mult small nabothian cysts Technician Comments: PELVIC US TA/TV: homogeneous anteverted uterus,wnl,EEC 15 mm (difficult to see boarders of endometrium),trace of simple fluid within the endometrium,normal ovaries,ovaries appear mobile,pelvic pain during ultrasound [employer] 01/22/2019 3:44 PM Clinical Impression and recommendations: I have reviewed the sonogram results above, combined with the patient's current clinical course, below are my impressions and any appropriate recommendations for management based on the sonographic findings. Uterus is normal Endometrium consistent with megestrol therapy, normal no submucosal myoma is noted Both ovaries are normal Lazaro Arms 01/22/2019 3:59 PM  GYN Trans ABD  Result Date: 01/22/2019 GYNECOLOGIC SONOGRAM Tanya Krause is a 34 y.o. [id number] LMP 10/04/2018,she is here for a pelvic sonogram for menorrhagia/dysmenorrhea. Uterus                      7.2 x 4 x 4.7 cm, Total uterine volume 70 cc, homogeneous anteverted uterus,wnl Endometrium          15 mm, symmetrical, difficult to see boarders of endometrium,trace of simple fluid within the endometrium Right ovary              3.1 x 2.3 x 3 cm, wnl Left ovary                2.6 x 2 x 2.3 cm, wnl Mult small nabothian cysts Technician Comments: PELVIC US TA/TV: homogeneous anteverted uterus,wnl,EEC 15 mm (difficult to see boarders of endometrium),trace of simple fluid within the endometrium,normal ovaries,ovaries appear mobile,pelvic pain during ultrasound [employer] 01/22/2019 3:44 PM Clinical Impression and recommendations: I have reviewed the sonogram results above, combined with the patient's current clinical course, below are my impressions and any appropriate recommendations for management based on the sonographic findings. Uterus is normal Endometrium consistent with megestrol therapy, normal no submucosal myoma is noted Both ovaries  are normal Lazaro Arms 01/22/2019 3:59 PM     Assessment: DUB Dysmenorrhea Dysparunia, bump, cervical origin Chronic pelvic pain, lateral, bilateral  Plan: TAH BSO  Pt understands the risks of surgery including but not limited t  excessive bleeding requiring transfusion or reoperation, post-operative infection requiring prolonged hospitalization or re-hospitalization and antibiotic therapy, and damage to other organs including bladder, bowel, ureters and major vessels.  The patient also understands the alternative treatment options which were discussed in full.  All questions were answered.  Tanya Krause 02/06/2019 9:02 AM  Lazaro Arms 02/06/2019 9:01 AM

## 2019-02-06 NOTE — Progress Notes (Signed)
In room 316. Belongings and eyeglasses at bedside.

## 2019-02-06 NOTE — Plan of Care (Signed)
  Problem: Education: Goal: Knowledge of General Education information will improve Description Including pain rating scale, medication(s)/side effects and non-pharmacologic comfort measures Outcome: Progressing   Problem: Health Behavior/Discharge Planning: Goal: Ability to manage health-related needs will improve Outcome: Progressing   

## 2019-02-06 NOTE — Anesthesia Postprocedure Evaluation (Signed)
Anesthesia Post Note  Patient: [place] M Lick  Procedure(s) Performed: HYSTERECTOMY ABDOMINAL WITH BILATERAL SALPINGO-OOPHORECTOMY (Bilateral Abdomen)  Patient location during evaluation: PACU Anesthesia Type: General Level of consciousness: sedated Pain management: pain level controlled Vital Signs Assessment: post-procedure vital signs reviewed and stable Respiratory status: spontaneous breathing and patient connected to nasal cannula oxygen Cardiovascular status: stable Postop Assessment: no apparent nausea or vomiting Anesthetic complications: no Comments: No further antiemetic given;pt snoring     Last Vitals:  Vitals:   02/06/19 1230 02/06/19 1233  BP: 126/87   Pulse: (!) 115 (!) 116  Resp: (!) 25 14  Temp:    SpO2: 100% 100%    Last Pain:  Vitals:   02/06/19 1215  TempSrc:   PainSc: Marjo Bicker

## 2019-02-06 NOTE — Anesthesia Procedure Notes (Signed)
Procedure Name: Intubation Date/Time: 02/06/2019 9:47 AM Performed by: Georgeanne Nim, CRNA Pre-anesthesia Checklist: Patient identified, Emergency Drugs available, Suction available, Patient being monitored and Timeout performed Patient Re-evaluated:Patient Re-evaluated prior to induction Oxygen Delivery Method: Circle system utilized Preoxygenation: Pre-oxygenation with 100% oxygen Induction Type: IV induction Ventilation: Mask ventilation without difficulty Laryngoscope Size: Mac and 4 Grade View: Grade I Tube type: Oral Tube size: 7.0 mm Number of attempts: 1 Airway Equipment and Method: Stylet Placement Confirmation: ETT inserted through vocal cords under direct vision,  positive ETCO2,  CO2 detector and breath sounds checked- equal and bilateral Secured at: 20 cm Tube secured with: Tape Dental Injury: Teeth and Oropharynx as per pre-operative assessment

## 2019-02-06 NOTE — Anesthesia Preprocedure Evaluation (Signed)
Anesthesia Evaluation  Patient identified by MRN, date of birth, ID band Patient awake    Reviewed: Allergy & Precautions, NPO status , Patient's Chart, lab work & pertinent test results  History of Anesthesia Complications Negative for: history of anesthetic complications  Airway        Dental   Pulmonary asthma , Current Smoker and Patient abstained from smoking.,    Pulmonary exam normal breath sounds clear to auscultation       Cardiovascular hypertension, Normal cardiovascular exam Rhythm:Regular Rate:Normal     Neuro/Psych  Headaches, Anxiety  Neuromuscular disease    GI/Hepatic negative GI ROS, Neg liver ROS,   Endo/Other  negative endocrine ROS  Renal/GU negative Renal ROS  negative genitourinary   Musculoskeletal  (+) Fibromyalgia -  Abdominal   Peds  Hematology negative hematology ROS (+)   Anesthesia Other Findings   Reproductive/Obstetrics                            Anesthesia Physical Anesthesia Plan  ASA: II  Anesthesia Plan: General   Post-op Pain Management:    Induction: Intravenous  PONV Risk Score and Plan: 4 or greater and Dexamethasone, Midazolam, Diphenhydramine and Scopolamine patch - Pre-op  Airway Management Planned: Oral ETT  Additional Equipment:   Intra-op Plan:   Post-operative Plan:   Informed Consent: I have reviewed the patients History and Physical, chart, labs and discussed the procedure including the risks, benefits and alternatives for the proposed anesthesia with the patient or authorized representative who has indicated his/her understanding and acceptance.     Dental advisory given  Plan Discussed with: CRNA  Anesthesia Plan Comments:         Anesthesia Quick Evaluation

## 2019-02-07 ENCOUNTER — Telehealth: Payer: Self-pay | Admitting: *Deleted

## 2019-02-07 ENCOUNTER — Other Ambulatory Visit: Payer: Self-pay | Admitting: Obstetrics & Gynecology

## 2019-02-07 LAB — BASIC METABOLIC PANEL
Anion gap: 8 (ref 5–15)
BUN: <5 mg/dL — ABNORMAL LOW (ref 6–20)
CO2: 24 mmol/L (ref 22–32)
Calcium: 9.2 mg/dL (ref 8.9–10.3)
Chloride: 104 mmol/L (ref 98–111)
Creatinine, Ser: 0.63 mg/dL (ref 0.44–1.00)
GFR calc Af Amer: >60 mL/min (ref >60)
GFR calc non Af Amer: >60 mL/min (ref >60)
Glucose, Bld: 133 mg/dL — ABNORMAL HIGH (ref 70–99)
Potassium: 3.8 mmol/L (ref 3.5–5.1)
Sodium: 136 mmol/L (ref 135–145)

## 2019-02-07 LAB — CBC
HCT: 34.7 % — ABNORMAL LOW (ref 36.0–46.0)
Hemoglobin: 12.2 g/dL (ref 12.0–15.0)
MCH: 29.2 pg (ref 26.0–34.0)
MCHC: 35.2 g/dL (ref 30.0–36.0)
MCV: 83 fL (ref 80.0–100.0)
Platelets: 268 10*3/uL (ref 150–400)
RBC: 4.18 MIL/uL (ref 3.87–5.11)
RDW: 13.4 % (ref 11.5–15.5)
WBC: 14.9 10*3/uL — ABNORMAL HIGH (ref 4.0–10.5)
nRBC: 0 % (ref 0.0–0.2)

## 2019-02-07 LAB — SURGICAL PATHOLOGY

## 2019-02-07 MED ORDER — OXYCODONE-ACETAMINOPHEN 7.5-325 MG PO TABS: 1.0000 | ORAL_TABLET | Freq: Four times a day (QID) | ORAL | 0 refills | Status: DC | PRN

## 2019-02-07 MED ORDER — ZOLPIDEM TARTRATE 10 MG PO TABS: 10.0000 mg | ORAL_TABLET | Freq: Every evening | ORAL | 0 refills | Status: DC | PRN

## 2019-02-07 MED ORDER — ESTRADIOL 2 MG PO TABS: 2.0000 mg | ORAL_TABLET | Freq: Every day | ORAL | 11 refills | Status: DC

## 2019-02-07 NOTE — Progress Notes (Signed)
Nsg Discharge Note  Admit Date:  02/06/2019 Discharge date: 02/07/2019   [place] M Gorum to be D/C'd Home per MD order.  AVS completed.  Copy for chart, and copy for patient signed, and dated. Removed IVs-clean, dry, intact. Reviewed d/c paperwork with patient. Reviewed new medications and changes. Answered all questions. Patient/caregiver able to verbalize understanding. Wheeled stable patient and belongings to main entrance where she was picked up by her friend to d/c home.  Discharge Medication:   Discharge Assessment: Vitals:   02/07/19 0823 02/07/19 1000  BP: 126/87 (!) 129/93  Pulse: (!) 118 (!) 111  Resp: 17 18  Temp: 98.7 F (37.1 C) 98.1 F (36.7 C)  SpO2: 97% 97%   Skin clean, dry and intact without evidence of skin break down, no evidence of skin tears noted. IV catheter discontinued intact. Site without signs and symptoms of complications - no redness or edema noted at insertion site, patient denies c/o pain - only slight tenderness at site.  Dressing with slight pressure applied.  D/c Instructions-Education: Discharge instructions given to patient/family with verbalized understanding. D/c education completed with patient/family including follow up instructions, medication list, d/c activities limitations if indicated, with other d/c instructions as indicated by MD - patient able to verbalize understanding, all questions fully answered. Patient instructed to return to ED, call 911, or call MD for any changes in condition.  Patient escorted via WC, and D/C home via private auto.  Karolee Ohs, RN 02/07/2019 1:04 PM

## 2019-02-07 NOTE — Addendum Note (Signed)
Addendum  created 02/07/19 1026 by Moshe Salisbury, CRNA   Charge Capture section accepted

## 2019-02-07 NOTE — Discharge Instructions (Signed)
Abdominal Hysterectomy, Care After This sheet gives you information about how to care for yourself after your procedure. Your health care provider may also give you more specific instructions. If you have problems or questions, contact your health care provider. What can I expect after the procedure? After your procedure, it is common to have:  Pain.  Fatigue.  Poor appetite.  Less interest in sex.  Vaginal bleeding and discharge. You may need to use a sanitary napkin after this procedure. Follow these instructions at home: Bathing  Do not take baths, swim, or use a hot tub until your health care provider approves. Ask your health care provider if you can take showers. You may only be allowed to take sponge baths for bathing.  Keep the bandage (dressing) dry until your health care provider says it can be removed. Incision care   Follow instructions from your health care provider about how to take care of your incision. Make sure you: ? Wash your hands with soap and water before you change your bandage (dressing). If soap and water are not available, use hand sanitizer. ? Change your dressing as told by your health care provider. ? Leave stitches (sutures), skin glue, or adhesive strips in place. These skin closures may need to stay in place for 2 weeks or longer. If adhesive strip edges start to loosen and curl up, you may trim the loose edges. Do not remove adhesive strips completely unless your health care provider tells you to do that.  Check your incision area every day for signs of infection. Check for: ? Redness, swelling, or pain. ? Fluid or blood. ? Warmth. ? Pus or a bad smell. Activity  Do gentle, daily exercises as told by your health care provider. You may be told to take short walks every day and go farther each time.  Do not lift anything that is heavier than 10 lb (4.5 kg), or the limit that your health care provider tells you, until he or she says that it is  safe.  Do not drive or use heavy machinery while taking prescription pain medicine.  Do not drive for 24 hours if you were given a medicine to help you relax (sedative).  Follow your health care provider's instructions about exercise, driving, and general activities. Ask your health care provider what activities are safe for you. Lifestyle  Do not douche, use tampons, or have sex for at least 6 weeks or as told by your health care provider.  Do not drink alcohol until your health care provider approves.  Drink enough fluid to keep your urine clear or pale yellow.  Try to have someone at home with you for the first 1-2 weeks to help.  Do not use any products that contain nicotine or tobacco, such as cigarettes and e-cigarettes. These can delay healing. If you need help quitting, ask your health care provider. General instructions  Take over-the-counter and prescription medicines only as told by your health care provider.  Do not take aspirin or ibuprofen. These medicines can cause bleeding.  To prevent or treat constipation while you are taking prescription pain medicine, your health care provider may recommend that you: ? Drink enough fluid to keep your urine clear or pale yellow. ? Take over-the-counter or prescription medicines. ? Eat foods that are high in fiber, such as fresh fruits and vegetables, whole grains, and beans. ? Limit foods that are high in fat and processed sugars, such as fried and sweet foods.  Keep all   follow-up visits as told by your health care provider. This is important. Contact a health care provider if:  You have chills or fever.  You have redness, swelling, or pain around your incision.  You have fluid or blood coming from your incision.  Your incision feels warm to the touch.  You have pus or a bad smell coming from your incision.  Your incision breaks open.  You feel dizzy or light-headed.  You have pain or bleeding when you urinate.  You  have persistent diarrhea.  You have persistent nausea and vomiting.  You have abnormal vaginal discharge.  You have a rash.  You have any type of abnormal reaction or you develop an allergy to your medicine.  Your pain medicine does not help. Get help right away if:  You have a fever and your symptoms suddenly get worse.  You have severe abdominal pain.  You have shortness of breath.  You faint.  You have pain, swelling, or redness in your leg.  You have heavy vaginal bleeding with blood clots. Summary  After your procedure, it is common to have pain, fatigue and vaginal discharge.  Do not take baths, swim, or use a hot tub until your health care provider approves. Ask your health care provider if you can take showers. You may only be allowed to take sponge baths for bathing.  Follow your health care provider's instructions about exercise, driving, and general activities. Ask your health care provider what activities are safe for you.  Do not lift anything that is heavier than 10 lb (4.5 kg), or the limit that your health care provider tells you, until he or she says that it is safe.  Try to have someone at home with you for the first 1-2 weeks to help. This information is not intended to replace advice given to you by your health care provider. Make sure you discuss any questions you have with your health care provider. Document Revised: 02/06/2018 Document Reviewed: 12/23/2015 Elsevier Patient Education  2020 [employer]  

## 2019-02-07 NOTE — Plan of Care (Signed)
  Problem: Education: Goal: Knowledge of General Education information will improve Description Including pain rating scale, medication(s)/side effects and non-pharmacologic comfort measures Outcome: Progressing   Problem: Health Behavior/Discharge Planning: Goal: Ability to manage health-related needs will improve Outcome: Progressing   

## 2019-02-07 NOTE — Progress Notes (Signed)
Pt ambulated to and from bathroom with minimal assist to ensure safety. Pt able to ambulate independently.

## 2019-02-07 NOTE — Plan of Care (Signed)
  Problem: Education: Goal: Knowledge of General Education information will improve Description: Including pain rating scale, medication(s)/side effects and non-pharmacologic comfort measures 02/07/2019 1105 by Karolee Ohs, RN Outcome: Adequate for Discharge 02/07/2019 1104 by Karolee Ohs, RN Outcome: Progressing   Problem: Clinical Measurements: Goal: Ability to maintain clinical measurements within normal limits will improve Outcome: Adequate for Discharge Goal: Will remain free from infection Outcome: Adequate for Discharge Goal: Diagnostic test results will improve Outcome: Adequate for Discharge Goal: Respiratory complications will improve Outcome: Adequate for Discharge Goal: Cardiovascular complication will be avoided Outcome: Adequate for Discharge   Problem: Activity: Goal: Risk for activity intolerance will decrease Outcome: Adequate for Discharge   Problem: Nutrition: Goal: Adequate nutrition will be maintained Outcome: Adequate for Discharge   Problem: Coping: Goal: Level of anxiety will decrease Outcome: Adequate for Discharge   Problem: Elimination: Goal: Will not experience complications related to bowel motility Outcome: Adequate for Discharge Goal: Will not experience complications related to urinary retention Outcome: Adequate for Discharge   Problem: Pain Managment: Goal: General experience of comfort will improve Outcome: Adequate for Discharge   Problem: Safety: Goal: Ability to remain free from injury will improve Outcome: Adequate for Discharge   Problem: Skin Integrity: Goal: Risk for impaired skin integrity will decrease Outcome: Adequate for Discharge

## 2019-02-07 NOTE — Telephone Encounter (Signed)
That is her routine script by another provider She needs the oxycodone for her post op pain management and I told her to not take the hydrocodone while she is on the oxycodone, please have the pharmacist reinforce this

## 2019-02-07 NOTE — Telephone Encounter (Signed)
Pt has had surgery and was prescribed Oxycodone. Pharmacy is telling her it needs a PA. Please call. Thanks!!

## 2019-02-07 NOTE — Progress Notes (Signed)
Reported to [facility] RN that pt requested that Dr. Despina Hidden prescribe additional Ambien at discharge. Per Carollee Herter pt advised to see her PCP for this request.

## 2019-02-07 NOTE — Telephone Encounter (Signed)
[facility] called regarding patients rx for Oxycodone. Pharmacist wanted to be sure that Dr. Despina Hidden was aware that patient had received Hydrocodone/apap 5/325 mg #90 yesterday.

## 2019-02-07 NOTE — Telephone Encounter (Signed)
Office staff is working on [employer] for Oxycodone. Encounter closed. JSY

## 2019-02-07 NOTE — Addendum Note (Signed)
Addendum  created 02/07/19 1244 by Moshe Salisbury, CRNA   Intraprocedure Event edited, Intraprocedure Staff edited

## 2019-02-07 NOTE — Discharge Summary (Signed)
Physician Discharge Summary  Patient ID: Tanya Krause MRN: [id number] DOB/AGE: 1985-09-16 34 y.o.  Admit date: 02/06/2019 Discharge date: 02/07/2019  Admission Diagnoses: S/P TAH BSO  Discharge Diagnoses:  Active Problems:   S/P hysterectomy   S/P TAH-BSO (total abdominal hysterectomy and bilateral salpingo-oophorectomy)   Discharged Condition: good  Hospital Course: unremarkable post op course  Consults: None  Significant Diagnostic Studies: labs:   Results for orders placed or performed during the hospital encounter of 02/06/19 (from the past 24 hour(s))  CBC     Status: Abnormal   Collection Time: 02/07/19  5:40 AM  Result Value Ref Range   WBC 14.9 (H) 4.0 - 10.5 K/uL   RBC 4.18 3.87 - 5.11 MIL/uL   Hemoglobin 12.2 12.0 - 15.0 g/dL   HCT 34.7 (L) 36.0 - 46.0 %   MCV 83.0 80.0 - 100.0 fL   MCH 29.2 26.0 - 34.0 pg   MCHC 35.2 30.0 - 36.0 g/dL   RDW 13.4 11.5 - 15.5 %   Platelets 268 150 - 400 K/uL   nRBC 0.0 0.0 - 0.2 %  Basic metabolic panel     Status: Abnormal   Collection Time: 02/07/19  5:40 AM  Result Value Ref Range   Sodium 136 135 - 145 mmol/L   Potassium 3.8 3.5 - 5.1 mmol/L   Chloride 104 98 - 111 mmol/L   CO2 24 22 - 32 mmol/L   Glucose, Bld 133 (H) 70 - 99 mg/dL   BUN <5 (L) 6 - 20 mg/dL   Creatinine, Ser 0.63 0.44 - 1.00 mg/dL   Calcium 9.2 8.9 - 10.3 mg/dL   GFR calc non Af Amer >60 >60 mL/min   GFR calc Af Amer >60 >60 mL/min   Anion gap 8 5 - 15    Treatments: surgery: TAH BSO  Discharge Exam: Blood pressure 126/87, pulse (!) 118, temperature 98.7 F (37.1 C), temperature source Oral, resp. rate 17, height 5\' 4"  (1.626 m), weight 78 kg, SpO2 97 %. General appearance: alert, cooperative and no distress GI: soft, non-tender; bowel sounds normal; no masses,  no organomegaly Incision/Wound: incision clean dry intact  Disposition: Discharge disposition: 01-Home or Self Care       Discharge Instructions    Call MD for:  persistant  nausea and vomiting   Complete by: As directed    Call MD for:  severe uncontrolled pain   Complete by: As directed    Call MD for:  temperature >100.4   Complete by: As directed    Diet - low sodium heart healthy   Complete by: As directed    Driving Restrictions   Complete by: As directed    No driving for 2 weeks   Increase activity slowly   Complete by: As directed    Leave dressing on - Keep it clean, dry, and intact until clinic visit   Complete by: As directed    Lifting restrictions   Complete by: As directed    Do not lift more than 10 pounds for 6 weeks   Sexual Activity Restrictions   Complete by: As directed    No sex until given the go ahead by Dr Elonda Husky      Follow-up Information    Analea Muller, Mertie Clause, MD Follow up on 02/18/2019.   Specialties: Obstetrics and Gynecology, Radiology Why: post op visit Contact information: [place] [address] [phone]           Signed:  Lazaro Arms 02/07/2019, 10:13 AM

## 2019-02-07 NOTE — Progress Notes (Signed)
Pt ambulated to bathroom with assistance and returned to bed without assist without difficulty. I have encouraged pt to ambulate down the hall before discharge.

## 2019-02-07 NOTE — Progress Notes (Signed)
Pt has been up to the bathroom and voided multiple times this shift, both assisted and unassisted. Pt says her pain has never been below 8/10 despite heat packs, an abdominal binder she requested, multiple hot packs, frequent repositioning and all pharmacologic interventions including Q2H fentanyl IV. States she is nauseated but also hungry; has been eating, no episodes of emesis.

## 2019-02-18 ENCOUNTER — Other Ambulatory Visit: Payer: Self-pay

## 2019-02-18 ENCOUNTER — Ambulatory Visit (INDEPENDENT_AMBULATORY_CARE_PROVIDER_SITE_OTHER): Payer: Medicaid Other | Admitting: Obstetrics & Gynecology

## 2019-02-18 ENCOUNTER — Encounter: Payer: Self-pay | Admitting: Obstetrics & Gynecology

## 2019-02-18 VITALS — BP 140/92 | HR 86 | Ht 60.0 in | Wt 174.0 lb

## 2019-02-18 DIAGNOSIS — Z90710 Acquired absence of both cervix and uterus: Secondary | ICD-10-CM

## 2019-02-18 DIAGNOSIS — Z48816 Encounter for surgical aftercare following surgery on the genitourinary system: Secondary | ICD-10-CM

## 2019-02-18 DIAGNOSIS — Z90722 Acquired absence of ovaries, bilateral: Secondary | ICD-10-CM

## 2019-02-18 MED ORDER — OXYCODONE-ACETAMINOPHEN 5-325 MG PO TABS: 1.0000 | ORAL_TABLET | ORAL | 0 refills | Status: DC | PRN

## 2019-02-18 NOTE — Progress Notes (Signed)
  HPI: Patient returns for routine postoperative follow-up having undergone TAH BSO on 02/06/19.  The patient's immediate postoperative recovery has been unremarkable. Since hospital discharge the patient reports no problems.   Current Outpatient Medications: .  cyclobenzaprine (FLEXERIL) 10 MG tablet, Take 1 tablet (10 mg total) by mouth 3 (three) times daily as needed for muscle spasms., Disp: 90 tablet, Rfl: 5 .  DULoxetine (CYMBALTA) 60 MG capsule, Take 60 mg by mouth daily., Disp: , Rfl:  .  estradiol (ESTRACE) 2 MG tablet, Take 1 tablet (2 mg total) by mouth daily., Disp: 30 tablet, Rfl: 11 .  folic acid (FOLVITE) 1 MG tablet, Take 1 tablet (1 mg total) by mouth daily., Disp: 30 tablet, Rfl: 2 .  hydrOXYzine (ATARAX/VISTARIL) 25 MG tablet, Take 25 mg by mouth 3 (three) times daily. , Disp: , Rfl:  .  lidocaine (LIDODERM) 5 %, Place 1 patch onto the skin daily. Remove & Discard patch within 12 hours or as directed by MD, Disp: , Rfl:  .  meloxicam (MOBIC) 15 MG tablet, Take 15 mg by mouth daily., Disp: , Rfl:   .  oxyCODONE-acetaminophen (PERCOCET) 7.5-325 MG tablet, Take 1-2 tablets by mouth every 6 (six) hours as needed., Disp: 45 tablet, Rfl: 0 .  Polyethylene Glycol 3350 (MIRALAX PO), Take 17 g by mouth daily. , Disp: , Rfl:  .  pregabalin (LYRICA) 150 MG capsule, Take 1 capsule (150 mg total) by mouth 2 (two) times daily., Disp: 60 capsule, Rfl: 5 .  promethazine (PHENERGAN) 6.25 MG/5ML syrup, Take 10 mLs (12.5 mg total) by mouth 4 (four) times daily as needed for nausea or vomiting., Disp: 120 mL, Rfl: 5 .  tiZANidine (ZANAFLEX) 4 MG tablet, Take 1 tablet (4 mg total) by mouth every 8 (eight) hours as needed for muscle spasms., Disp: 90 tablet, Rfl: 1 .  zolpidem (AMBIEN) 10 MG tablet, Take 1 tablet (10 mg total) by mouth at bedtime as needed for sleep., Disp: 15 tablet, Rfl: 0 .  nystatin (MYCOSTATIN) 100000 UNIT/ML suspension, Take 5 mLs (500,000 Units total) by mouth 4 (four) times  daily. (Patient not taking: Reported on 01/30/2019), Disp: 120 mL, Rfl: 0 .  zolpidem (AMBIEN) 10 MG tablet, Take 1 tablet (10 mg total) by mouth at bedtime as needed for sleep. (Patient not taking: Reported on 02/18/2019), Disp: 15 tablet, Rfl: 0 .  zolpidem (AMBIEN) 5 MG tablet, Take 1 tablet (5 mg total) by mouth at bedtime as needed for sleep. (Patient not taking: Reported on 02/18/2019), Disp: 30 tablet, Rfl: 0  No current facility-administered medications for this visit.    Blood pressure (!) 140/92, pulse 86, height 5' (1.524 m), weight 174 lb (78.9 kg), last menstrual period 12/04/2018.  Physical Exam: Incision clean dry intact adbominal exam is normal post op  Diagnostic Tests:   Pathology: benign  Impression: S/p TAH BSO  Plan: Normal post op course  Follow up: 4  weeks  Lazaro Arms, MD

## 2019-02-20 ENCOUNTER — Encounter: Payer: Medicaid Other | Admitting: Physical Medicine and Rehabilitation

## 2019-03-04 ENCOUNTER — Other Ambulatory Visit: Payer: Self-pay | Admitting: Physical Medicine and Rehabilitation

## 2019-03-04 ENCOUNTER — Other Ambulatory Visit: Payer: Self-pay | Admitting: Obstetrics & Gynecology

## 2019-03-06 ENCOUNTER — Encounter: Payer: Medicaid Other | Admitting: Physical Medicine and Rehabilitation

## 2019-03-11 ENCOUNTER — Encounter
Payer: Medicaid Other | Attending: Physical Medicine and Rehabilitation | Admitting: Physical Medicine and Rehabilitation

## 2019-03-11 ENCOUNTER — Other Ambulatory Visit: Payer: Self-pay

## 2019-03-11 ENCOUNTER — Encounter: Payer: Self-pay | Admitting: Physical Medicine and Rehabilitation

## 2019-03-11 VITALS — BP 138/99 | HR 116 | Temp 98.2°F | Ht 60.0 in | Wt 179.0 lb

## 2019-03-11 DIAGNOSIS — Z5181 Encounter for therapeutic drug level monitoring: Secondary | ICD-10-CM | POA: Diagnosis present

## 2019-03-11 DIAGNOSIS — G894 Chronic pain syndrome: Secondary | ICD-10-CM | POA: Diagnosis present

## 2019-03-11 DIAGNOSIS — M7918 Myalgia, other site: Secondary | ICD-10-CM | POA: Diagnosis not present

## 2019-03-11 DIAGNOSIS — G43719 Chronic migraine without aura, intractable, without status migrainosus: Secondary | ICD-10-CM

## 2019-03-11 DIAGNOSIS — M549 Dorsalgia, unspecified: Secondary | ICD-10-CM | POA: Diagnosis present

## 2019-03-11 DIAGNOSIS — G8929 Other chronic pain: Secondary | ICD-10-CM | POA: Diagnosis present

## 2019-03-11 DIAGNOSIS — R1084 Generalized abdominal pain: Secondary | ICD-10-CM | POA: Insufficient documentation

## 2019-03-11 DIAGNOSIS — B370 Candidal stomatitis: Secondary | ICD-10-CM

## 2019-03-11 DIAGNOSIS — M797 Fibromyalgia: Secondary | ICD-10-CM

## 2019-03-11 DIAGNOSIS — M7062 Trochanteric bursitis, left hip: Secondary | ICD-10-CM | POA: Insufficient documentation

## 2019-03-11 MED ORDER — FLUCONAZOLE 100 MG PO TABS: 200.0000 mg | ORAL_TABLET | Freq: Once | ORAL | 1 refills | Status: AC

## 2019-03-11 MED ORDER — ZOLPIDEM TARTRATE 5 MG PO TABS: 5.0000 mg | ORAL_TABLET | Freq: Every evening | ORAL | 3 refills | Status: DC | PRN

## 2019-03-11 MED ORDER — NYSTATIN 100000 UNIT/ML MT SUSP: 5.0000 mL | Freq: Four times a day (QID) | OROMUCOSAL | 1 refills | Status: DC

## 2019-03-11 NOTE — Patient Instructions (Addendum)
Patient is a 34 yr old female with fibromyalgia and myofascial pain syndrome here for trigger point injections and f/u and new dx of trochanteric bursitis on L and new thrush.   1. Will give Diflucan 200 mg x 1 day then 100 mg x 6 days daily- for thrush 2. Also Nystatin swish and swallow- 4x/day for thrush- treating from 2 ways.  3. Ginette Pitman is due to antibiotics- figure out which antibiotics and get Diflucan in future when receives that particular antibiotic.   4.Trochanteric bursitis- will do steroid injection.  Cleaned with betadine x 3- allowed to dry then injected kenalog 40 mg/1cc and 1cc of 1% Lidocaine with no EPI in to L lateral hip c/w trochanteric bursitis. Lidocaine works in 15 minutes- steroid kicks in tomorrow.      5. Refill Ambien 5mg  [facility] prn- pt got previous 10 mg from OB gyn, however split in half for her.   6. Patient here for trigger point injections for  Consent done and on chart.  Cleaned areas with alcohol and injected using a 27 gauge 1.5 inch needle  Injected 4cc Using 1% Lidocaine with no EPI  Upper traps B/L Levators B/L Posterior scalenes B/L Middle scalenes Splenius Capitus Pectoralis Major B/L Rhomboids Infraspinatus Teres Major/minor Thoracic paraspinals Lumbar paraspinals B/L x2 Other injections-    Patient's level of pain prior was 9/10 Current level of pain after injections is 6-7/10  There was no bleeding or complications.  Patient was advised to drink a lot of water on day after injections to flush system Will have increased soreness for 12-48 hours after injections.  Can use Lidocaine patches the day AFTER injections Can use theracane on day of injections in places didn't inject Can use heating pad 4-6 hours AFTER injections  7.  Discussed weight loss- add 1 "habit" per month- a REASONABLE habit to imporve weight loss ex: eat more veggies, drink less soda; even diet soda causes people to crave sugar.   8. Protein with sugar makes you  feel fuller.   30F/U in 4 weeks 10. Senokot-S 2 -4tabs/day or Miralax 1-capful- both OT    11. Roll towel up and lay on that or use rolling pin; and stretch lateral thigh muscle.

## 2019-03-11 NOTE — Progress Notes (Signed)
Subjective:    Patient ID: Tanya Krause, female    DOB: 05-17-1985, 34 y.o.   MRN: [id number]  HPI Patient is a 34 yr old female with fibromyalgia and myofascial pain syndrome here for trigger point injections and f/u.   Needed injections 2 weeks ago.  Was on bedrest from hysterectomy initially.   Last week had to get daughter who was sick.   Tongue furrowing- started again. Thought it was Elavil.  Hard to swallow; throat hurts really bad.  Started last week- 7-10 days after hysterectomy- got ABX when went home. Finished them and then throat aggravating and eating, burned whole mouth- brushing tongue, no improvement.   Hurting in L hip- laterally- can't lay on L side at all- hurts 9/10- limping due to pain- hurts to put pressure on L leg.    Wants shot AND to learn exercises to treat it.   Using Mg citrate to go/have BMs- constipated.    Pain Inventory Average Pain 9 Pain Right Now 10 My pain is intermittent, constant, sharp, burning, dull, stabbing, tingling and aching  In the last 24 hours, has pain interfered with the following? General activity 2 Relation with others 2 Enjoyment of life 3 What TIME of day is your pain at its worst? all Sleep (in general) Poor  Pain is worse with: walking, bending, sitting, standing and some activites Pain improves with: medication Relief from Meds: 5  Mobility walk with assistance use a walker  Function not employed: date last employed .  Neuro/Psych weakness numbness tingling spasms  Prior Studies Any changes since last visit?  no  Physicians involved in your care Any changes since last visit?  no   Family History  Problem Relation Age of Onset  . Hypertension Mother   . Diabetes Mother   . Heart failure Father   . Colon cancer Paternal Grandfather    Social History   Socioeconomic History  . Marital status: Single    Spouse name: Not on file  . Number of children: 4  . Years of education: 24  .  Highest education level: Not on file  Occupational History    Comment: unemployed  Tobacco Use  . Smoking status: Current Some Day Smoker    Packs/day: 0.14    Years: 5.00    Pack years: 0.70    Types: Cigarettes  . Smokeless tobacco: Never Used  . Tobacco comment: 3 cig daily  Substance and Sexual Activity  . Alcohol use: Not Currently  . Drug use: Yes    Types: Marijuana    Comment: last used september 2020  . Sexual activity: Yes    Birth control/protection: Surgical  Other Topics Concern  . Not on file  Social History Narrative   Lives with parents, children   Caffeine use- coffee once a day, sodas x 2 a day   Social Determinants of Health   Financial Resource Strain:   . Difficulty of Paying Living Expenses: Not on file  Food Insecurity:   . Worried About Programme researcher, broadcasting/film/video in the Last Year: Not on file  . Ran Out of Food in the Last Year: Not on file  Transportation Needs:   . Lack of Transportation (Medical): Not on file  . Lack of Transportation (Non-Medical): Not on file  Physical Activity:   . Days of Exercise per Week: Not on file  . Minutes of Exercise per Session: Not on file  Stress:   . Feeling of Stress : Not  on file  Social Connections:   . Frequency of Communication with Friends and Family: Not on file  . Frequency of Social Gatherings with Friends and Family: Not on file  . Attends Religious Services: Not on file  . Active Member of Clubs or Organizations: Not on file  . Attends Archivist Meetings: Not on file  . Marital Status: Not on file   Past Surgical History:  Procedure Laterality Date  . HYSTERECTOMY ABDOMINAL WITH SALPINGO-OOPHORECTOMY Bilateral 02/06/2019   Procedure: HYSTERECTOMY ABDOMINAL WITH BILATERAL SALPINGO-OOPHORECTOMY;  Surgeon: Florian Buff, MD;  Location: [facility];  Service: Gynecology;  Laterality: Bilateral;  . kidney infections    . TUBAL LIGATION    . WISDOM TOOTH EXTRACTION     Past Medical History:   Diagnosis Date  . Asthma   . Chronic abdominal pain   . Chronic migraine   . Chronic pain syndrome   . Fibromyalgia   . 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 Hypertension   . Migraine   . Pelvic inflammatory disease (PID)    BP (!) 138/99   Pulse (!) 116   Temp 98.2 F (36.8 C)   Ht 5' (1.524 m)   Wt 179 lb (81.2 kg)   LMP 12/04/2018 (Exact Date) Comment: But patient started bleeding yesterday and states this not her cycle  SpO2 98%   BMI 34.96 kg/m   Opioid Risk Score:   Fall Risk Score:  `1  Depression screen PHQ 2/9  Depression screen [facility] 2/9 12/25/2018 10/25/2018 09/03/2018 11/18/2014 11/18/2014  Decreased Interest 0 1 0 3 3  Down, Depressed, Hopeless 0 1 1 3 3   PHQ - 2 Score 0 2 1 6 6   Altered sleeping - - 3 3 -  Tired, decreased energy - - 3 3 -  Change in appetite - - 2 3 -  Feeling bad or failure about yourself  - - 0 3 -  Trouble concentrating - - 2 2 -  Moving slowly or fidgety/restless - - 0 3 -  Suicidal thoughts - - 0 0 -  PHQ-9 Score - - 11 23 -    Review of Systems  Constitutional: Negative.   HENT: Negative.   Eyes: Negative.   Respiratory: Negative.   Cardiovascular: Negative.   Gastrointestinal: Positive for diarrhea, nausea and vomiting.  Endocrine: Negative.   Musculoskeletal: Positive for arthralgias, back pain, gait problem and neck pain.       Spasms   Skin: Negative.   Allergic/Immunologic: Negative.   Neurological: Positive for weakness and numbness.       Tingling  Psychiatric/Behavioral: Negative.   All other systems reviewed and are negative.      Objective:   Physical Exam  awake, alert, appropriate, sitting up on table, NAD Tongue furrowed and coated with thrush- and thrush on oropharynx.  Trigger points in neck, shoulders and upper back.  TTP on L lateral hip c/w trochanteric bursitis RRR CTA B/L abd soft, NT, ND, (+)BS      Assessment & Plan:   Patient is a 34 yr old female with fibromyalgia and myofascial pain  syndrome here for trigger point injections and f/u and new dx of trochanteric bursitis on L and new thrush.   1. Will give Diflucan 200 mg x 1 day then 100 mg x 6 days daily- for thrush 2. Also Nystatin swish and swallow- 4x/day for thrush- treating from 2 ways.  3. Ritta Slot is due to antibiotics- figure out which antibiotics  and get Diflucan in future when receives that particular antibiotic.   4.Trochanteric bursitis- will do steroid injection.  Cleaned with betadine x 3- allowed to dry then injected kenalog 40 mg/1cc and 1cc of 1% Lidocaine with no EPI in to L lateral hip c/w trochanteric bursitis. Lidocaine works in 15 minutes- steroid kicks in tomorrow.      5. Refill Ambien 5mg  [facility] prn- pt got previous 10 mg from OB gyn, however split in half for her.   6. Patient here for trigger point injections for  Consent done and on chart.  Cleaned areas with alcohol and injected using a 27 gauge 1.5 inch needle  Injected 4cc Using 1% Lidocaine with no EPI  Upper traps B/L Levators B/L Posterior scalenes B/L Middle scalenes Splenius Capitus Pectoralis Major B/L Rhomboids Infraspinatus Teres Major/minor Thoracic paraspinals Lumbar paraspinals B/L x2 Other injections-    Patient's level of pain prior was 9/10 Current level of pain after injections is 6-7/10  There was no bleeding or complications.  Patient was advised to drink a lot of water on day after injections to flush system Will have increased soreness for 12-48 hours after injections.  Can use Lidocaine patches the day AFTER injections Can use theracane on day of injections in places didn't inject Can use heating pad 4-6 hours AFTER injections  7.  Discussed weight loss- add 1 "habit" per month- a REASONABLE habit to imporve weight loss ex: eat more veggies, drink less soda; even diet soda causes people to crave sugar.   8. Protein with sugar makes you feel fuller.   9. F/U in 4 weeks    10. Constipation- Senokot-S  2-4 tabs/day- Miralax 1 capful 1x/day until regular.    I spent a total of 50 minutes on appointment; more than 30 minutes educating on trochanteric bursitis, and stretching, and bowels

## 2019-03-12 ENCOUNTER — Telehealth: Payer: Self-pay | Admitting: *Deleted

## 2019-03-12 NOTE — Telephone Encounter (Signed)
[facility] called to inform us that patient has been getting Ambien 5 mg from another provider besides Dr. Despina Hidden.

## 2019-03-18 ENCOUNTER — Encounter: Payer: Medicaid Other | Admitting: Obstetrics & Gynecology

## 2019-03-25 ENCOUNTER — Other Ambulatory Visit: Payer: Self-pay | Admitting: Physical Medicine and Rehabilitation

## 2019-03-31 ENCOUNTER — Other Ambulatory Visit: Payer: Self-pay

## 2019-03-31 ENCOUNTER — Ambulatory Visit (HOSPITAL_COMMUNITY)
Admission: EM | Admit: 2019-03-31 | Discharge: 2019-03-31 | Disposition: A | Discharge status: Home / Self Care | Payer: Medicaid Other | Attending: Family Medicine | Admitting: Family Medicine

## 2019-03-31 ENCOUNTER — Encounter (HOSPITAL_COMMUNITY): Payer: Self-pay

## 2019-03-31 DIAGNOSIS — B029 Zoster without complications: Secondary | ICD-10-CM

## 2019-03-31 MED ORDER — ACETAMINOPHEN-CODEINE #3 300-30 MG PO TABS: 1.0000 | ORAL_TABLET | Freq: Four times a day (QID) | ORAL | 0 refills | Status: DC | PRN

## 2019-03-31 MED ORDER — VALACYCLOVIR HCL 1 G PO TABS: 1000.0000 mg | ORAL_TABLET | Freq: Three times a day (TID) | ORAL | 0 refills | Status: DC

## 2019-03-31 NOTE — ED Provider Notes (Signed)
[facility]    CSN: [id number] Arrival date & time: 03/31/19  1510      History   Chief Complaint Chief Complaint  Patient presents with  . Herpes Zoster    on back     HPI El Tanya Krause is a 34 y.o. female.   HPI  Patient is here for shingles.  She has had a before.  Is in the same place.  She does notice a rash today.  It is on the left side of her upper back around the shoulder blade. She has not had any shingles vaccinations. She does have chronic pain She has a prescription for hydrocodone.  She gets 90 a month.  She last got a prescription on 03/04/2019.  It is due tomorrow.  She states that she will be unable to get it filled for a couple of days because she missed a follow-up with her pain provider.  She plans to call her pain provider on Monday. She has side effects from gabapentin.  She is on Lyrica  Past Medical History:  Diagnosis Date  . Asthma   . Chronic abdominal pain   . Chronic migraine   . Chronic pain syndrome   . Fibromyalgia   . 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 Hypertension   . Migraine   . Pelvic inflammatory disease (PID)     Patient Active Problem List   Diagnosis Date Noted  . Trochanteric bursitis of left hip 03/11/2019  . S/P hysterectomy 02/06/2019  . S/P TAH-BSO (total abdominal hysterectomy and bilateral salpingo-oophorectomy) 02/06/2019  . Myofascial pain dysfunction syndrome 09/27/2018  . Chronic bilateral back pain 09/03/2018  . Colitis 08/12/2015  . Oral thrush 11/18/2014  . B12 deficiency 11/17/2014  . Chronic pain syndrome 11/17/2014  . Numbness 11/17/2014  . Intractable headache 11/17/2014  . Intractable chronic migraine without aura and without status migrainosus 11/17/2014  . Chronic migraine 10/11/2014  . Primary fibromyalgia syndrome 10/11/2014  . Lower extremity weakness 10/11/2014  . Vitamin B12 deficiency 10/11/2014  . Anxiety state 10/11/2014  . Nausea & vomiting 10/08/2014  . Headache, tension  type, chronic 09/07/2012  . Abdominal pain 06/15/2012    Past Surgical History:  Procedure Laterality Date  . HYSTERECTOMY ABDOMINAL WITH SALPINGO-OOPHORECTOMY Bilateral 02/06/2019   Procedure: HYSTERECTOMY ABDOMINAL WITH BILATERAL SALPINGO-OOPHORECTOMY;  Surgeon: Lazaro Arms, MD;  Location: [facility];  Service: Gynecology;  Laterality: Bilateral;  . kidney infections    . TUBAL LIGATION    . WISDOM TOOTH EXTRACTION      OB History    Gravida  4   Para  4   Term  1   Preterm  3   AB      Living  4     SAB      TAB      Ectopic      Multiple      Live Births  4            Home Medications    Prior to Admission medications   Medication Sig Start Date End Date Taking? Authorizing Provider  acetaminophen-codeine (TYLENOL #3) 300-30 MG tablet Take 1-2 tablets by mouth every 6 (six) hours as needed for moderate pain. 03/31/19   Eustace Moore, MD  cyclobenzaprine (FLEXERIL) 10 MG tablet Take 1 tablet (10 mg total) by mouth 3 (three) times daily as needed for muscle spasms. 12/21/18   Lovorn, Aundra Millet, MD  DULoxetine (CYMBALTA) 60 MG capsule Take 60 mg  by mouth daily.    [provider]  estradiol (ESTRACE) 2 MG tablet Take 1 tablet (2 mg total) by mouth daily. 02/07/19   Florian Buff, MD  folic acid (FOLVITE) 1 MG tablet Take 1 tablet (1 mg total) by mouth daily. 10/17/14   Donne Hazel, MD  hydrOXYzine (ATARAX/VISTARIL) 25 MG tablet Take 25 mg by mouth 3 (three) times daily.     [provider]  lidocaine (LIDODERM) 5 % Place 1 patch onto the skin daily. Remove & Discard patch within 12 hours or as directed by MD    [provider]  meloxicam (MOBIC) 15 MG tablet Take 15 mg by mouth daily.    [provider]  nystatin (MYCOSTATIN) 100000 UNIT/ML suspension Take 5 mLs (500,000 Units total) by mouth 4 (four) times daily. 03/11/19   Lovorn, Jinny Blossom, MD  Polyethylene Glycol 3350 (MIRALAX PO) Take 17 g by mouth daily.     [provider]  pregabalin (LYRICA) 150 MG capsule Take 1 capsule (150 mg total) by mouth 2 (two) times daily. 12/21/18 12/21/19  Lovorn, Jinny Blossom, MD  promethazine (PHENERGAN) 6.25 MG/5ML syrup Take 10 mLs (12.5 mg total) by mouth 4 (four) times daily as needed for nausea or vomiting. 01/23/19 01/23/20  Lovorn, Jinny Blossom, MD  tiZANidine (ZANAFLEX) 4 MG tablet TAKE ONE TABLET BY MOUTH EVERY 8 HOURS AS NEEDED FOR MUSCLE SPASMS. 03/25/19   Lovorn, Jinny Blossom, MD  valACYclovir (VALTREX) 1000 MG tablet Take 1 tablet (1,000 mg total) by mouth 3 (three) times daily. 03/31/19   Raylene Everts, MD  zolpidem (AMBIEN) 5 MG tablet Take 1 tablet (5 mg total) by mouth at bedtime as needed for sleep. 03/11/19   Courtney Heys, MD    Family History Family History  Problem Relation Age of Onset  . Hypertension Mother   . Diabetes Mother   . Heart failure Father   . Colon cancer Paternal Grandfather     Social History Social History   Tobacco Use  . Smoking status: Current Some Day Smoker    Packs/day: 0.14    Years: 5.00    Pack years: 0.70    Types: Cigarettes  . Smokeless tobacco: Never Used  . Tobacco comment: 3 cig daily  Substance Use Topics  . Alcohol use: Not Currently  . Drug use: Yes    Types: Marijuana    Comment: last used september 2020     Allergies   Metoclopramide, Tomato, Aspirin, Doxycycline, Hydromorphone hcl, Ibuprofen, Ondansetron, Other, Shellfish allergy, Toradol [ketorolac tromethamine], Latex, and Tramadol   Review of Systems Review of Systems  Skin: Positive for rash.     Physical Exam Triage Vital Signs ED Triage Vitals  Enc Vitals Group     BP 03/31/19 1646 (!) 160/95     Pulse Rate 03/31/19 1646 100     Resp --      Temp 03/31/19 1646 98.5 F (36.9 C)     Temp Source 03/31/19 1646 Oral     SpO2 03/31/19 1646 100 %     Weight --      Height --      Head Circumference --      Peak Flow --      Pain Score 03/31/19 1647 10     Pain Loc --      Pain Edu? --       Excl. in [place]? --    No data found.  Updated Vital Signs BP (!) 160/95 (BP  Location: Left Arm)   Pulse 100   Temp 98.5 F (36.9 C) (Oral)   LMP 12/04/2018 (Exact Date)   SpO2 100%   Visual Acuity Right Eye Distance:   Left Eye Distance:   Bilateral Distance:    Right Eye Near:   Left Eye Near:    Bilateral Near:     Physical Exam Constitutional:      General: She is not in acute distress.    Appearance: She is well-developed.  HENT:     Head: Normocephalic and atraumatic.  Eyes:     Conjunctiva/sclera: Conjunctivae normal.     Pupils: Pupils are equal, round, and reactive to light.  Cardiovascular:     Rate and Rhythm: Normal rate.  Pulmonary:     Effort: Pulmonary effort is normal. No respiratory distress.  Abdominal:     General: There is no distension.     Palpations: Abdomen is soft.  Musculoskeletal:        General: Normal range of motion.     Cervical back: Normal range of motion.  Skin:    General: Skin is warm and dry.       Neurological:     Mental Status: She is alert.   Appears moderately uncomfortable  UC Treatments / Results  Labs (all labs ordered are listed, but only abnormal results are displayed) Labs Reviewed - No data to display  EKG   Radiology2 No results found. Procedures Procedures (including critical care time)2  Medications Ordered in UC Medications - No data to display  Initial Impression / Assessment and Plan / UC Course  I have reviewed the triage vital signs and the nursing notes.  Pertinent labs & imaging results that were available during my care of the patient were reviewed by me and considered in my medical decision making (see chart for details).     I discussed with the patient the pitfalls prescribing head medication to a patient on a narcotic contract.  She insists that she will not be able to get her pain prescription filled for couple of days and she is out of her usual hydrocodone.  This is supported by  review of the [employer].  I am going to give her a limited number of hydrocodone.  She will also take valacyclovir.  She does have Lyrica already.  She will call her pain provider and her primary care doctor tomorrow. Final Clinical Impressions(s) / UC Diagnoses   Final diagnoses:  Herpes zoster without complication     Discharge Instructions     Take the Valtrex 3 times a day for 7 days Take your Lyrica as prescribed Take Tylenol with codeine as needed severe pain You must call your pain provider tomorrow and let them know you are seen at an urgent care for shingles.  Let them know the urgent care doctor felt you needed pain medication and checked the [facility] narcotic database prior to prescribing.   ED Prescriptions    Medication Sig Dispense Auth. Provider   valACYclovir (VALTREX) 1000 MG tablet Take 1 tablet (1,000 mg total) by mouth 3 (three) times daily. 21 tablet Eustace Moore, MD   acetaminophen-codeine (TYLENOL #3) 300-30 MG tablet Take 1-2 tablets by mouth every 6 (six) hours as needed for moderate pain. 15 tablet Eustace Moore, MD     I have reviewed the PDMP during this encounter.   Eustace Moore, MD 03/31/19 Rickey Primus

## 2019-03-31 NOTE — Discharge Instructions (Addendum)
Take the Valtrex 3 times a day for 7 days Take your Lyrica as prescribed Take Tylenol with codeine as needed severe pain You must call your pain provider tomorrow and let them know you are seen at an urgent care for shingles.  Let them know the urgent care doctor felt you needed pain medication and checked the [facility] narcotic database prior to prescribing.

## 2019-03-31 NOTE — ED Triage Notes (Signed)
Pt present  Shingles located all on her back. Symptoms started yesterday when she was unable to move her arms. She states that the area is itching and burning.

## 2019-04-03 ENCOUNTER — Other Ambulatory Visit: Payer: Self-pay | Admitting: Physical Medicine and Rehabilitation

## 2019-04-08 ENCOUNTER — Other Ambulatory Visit: Payer: Self-pay

## 2019-04-08 ENCOUNTER — Encounter: Payer: Self-pay | Admitting: Physical Medicine and Rehabilitation

## 2019-04-08 ENCOUNTER — Telehealth: Payer: Self-pay | Admitting: *Deleted

## 2019-04-08 ENCOUNTER — Encounter
Payer: Medicaid Other | Attending: Physical Medicine and Rehabilitation | Admitting: Physical Medicine and Rehabilitation

## 2019-04-08 VITALS — BP 150/103 | HR 119 | Temp 97.7°F | Ht 60.0 in | Wt 180.4 lb

## 2019-04-08 DIAGNOSIS — G894 Chronic pain syndrome: Secondary | ICD-10-CM | POA: Diagnosis not present

## 2019-04-08 DIAGNOSIS — Z79891 Long term (current) use of opiate analgesic: Secondary | ICD-10-CM | POA: Insufficient documentation

## 2019-04-08 DIAGNOSIS — M797 Fibromyalgia: Secondary | ICD-10-CM | POA: Insufficient documentation

## 2019-04-08 DIAGNOSIS — Z5181 Encounter for therapeutic drug level monitoring: Secondary | ICD-10-CM | POA: Diagnosis not present

## 2019-04-08 DIAGNOSIS — G8929 Other chronic pain: Secondary | ICD-10-CM | POA: Diagnosis present

## 2019-04-08 DIAGNOSIS — G43519 Persistent migraine aura without cerebral infarction, intractable, without status migrainosus: Secondary | ICD-10-CM

## 2019-04-08 DIAGNOSIS — M549 Dorsalgia, unspecified: Secondary | ICD-10-CM | POA: Diagnosis present

## 2019-04-08 DIAGNOSIS — M7918 Myalgia, other site: Secondary | ICD-10-CM

## 2019-04-08 DIAGNOSIS — R1084 Generalized abdominal pain: Secondary | ICD-10-CM | POA: Diagnosis present

## 2019-04-08 MED ORDER — EMGALITY 120 MG/ML ~~LOC~~ SOAJ: 240.0000 mg | SUBCUTANEOUS | 3 refills | Status: AC

## 2019-04-08 MED ORDER — SUMATRIPTAN SUCCINATE 100 MG PO TABS: 100.0000 mg | ORAL_TABLET | ORAL | 5 refills | Status: DC | PRN

## 2019-04-08 NOTE — Progress Notes (Signed)
  Injection on L lateral hip went OK  Pain doing OK- a little bit, but not as bad a migraines.  Got cluster migraines coming back- going on for 2 weeks now.  Basically everyday- won't go away- vomiting again and getting worse and better.   Was in so much pain the other day, hurts so bad, was crying- dull, sharp, aching, throbbing,   Asking about CBD oil to be used.  Helped a little bit when tried ? But could have been pain medicine as well.  Ginette Pitman is gone.  Has a little bit of nystatin left.      Exam: Tight in upper neck, upper back and shoulders as usual    Assessment: Patient is a 34 yr old female with fibromyalgia and myofascial pain syndrome here for trigger point injections and f/u and new dx of trochanteric bursitis on L s/p steroid injection last month.  Plan: 1. Legal to use CBD oil- <3% of THC- it's LEGAL! Can use in -   2. CBG? Is a cannabinoid but doesn't make people high- will read about it.   3 Will think about low dose naltrexone-compounded for pain/esp fibromyalgia and nerve pain.  4. Has tried Imetrex- didn't work-    5. Patient here for trigger point injections for  Consent done and on chart.  Cleaned areas with alcohol and injected using a 27 gauge 1.5 inch needle  Injected 6cc Using 1% Lidocaine with no EPI  Upper traps B/L Levators B/L Posterior scalenes B/L Middle scalenes Splenius Capitus B/L Pectoralis Major B/L Rhomboids B/L Infraspinatus Teres Major/minor Thoracic paraspinals Lumbar paraspinals B/L Other injections-    Patient's level of pain prior was- 9-10/10 Current level of pain after injections is now 8/10 and relaxing  There was no bleeding or complications.  Patient was advised to drink a lot of water on day after injections to flush system Will have increased soreness for 12-48 hours after injections.  Can use Lidocaine patches the day AFTER injections Can use theracane on day of injections in places didn't  inject Can use heating pad 4-6 hours AFTER injections  6. Don't chew gum - bad for migraines  7. Will prescribe Emgality 240 mg initially then 120 mg every month - will need preauthorization for it- will send in.   8. F/U in 4 weeks  I spent a total of 45 minutes on appointment. More than 30 educating on imetrex and Emgality

## 2019-04-08 NOTE — Patient Instructions (Signed)
Assessment: Patient is a 34 yr old female with fibromyalgia and myofascial pain syndrome here for trigger point injections and f/u and new dx of trochanteric bursitis on L s/p steroid injection last month.  Plan: 1. Legal to use CBD oil- <3% of THC- it's LEGAL! Can use in [place]-   2. CBG? Is a cannabinoid but doesn't make people high- will read about it.   3 Will think about low dose naltrexone-compounded for pain/esp fibromyalgia and nerve pain.  4. Has tried Imetrex- didn't work-    5. Patient here for trigger point injections for  Consent done and on chart.  Cleaned areas with alcohol and injected using a 27 gauge 1.5 inch needle  Injected 6cc Using 1% Lidocaine with no EPI  Upper traps B/L Levators B/L Posterior scalenes B/L Middle scalenes Splenius Capitus B/L Pectoralis Major B/L Rhomboids B/L Infraspinatus Teres Major/minor Thoracic paraspinals Lumbar paraspinals B/L Other injections-    Patient's level of pain prior was- 9-10/10 Current level of pain after injections is now 8/10 and relaxing  There was no bleeding or complications.  Patient was advised to drink a lot of water on day after injections to flush system Will have increased soreness for 12-48 hours after injections.  Can use Lidocaine patches the day AFTER injections Can use theracane on day of injections in places didn't inject Can use heating pad 4-6 hours AFTER injections  6. Don't chew gum - bad for migraines  7. Will prescribe Emgality 240 mg initially then 120 mg every month - will need preauthorization for it- will send in.   8. F/U in 4 weeks

## 2019-04-08 NOTE — Telephone Encounter (Signed)
Prior authorization submitted to [facility] Tr[facility] for hydrocodone acetaminophen #90.  Approved from 04/06/2019 - 10/03/2019

## 2019-04-12 LAB — DRUG TOX MONITOR 1 W/CONF, ORAL FLD
Amphetamines: NEGATIVE ng/mL (ref <10)
Barbiturates: NEGATIVE ng/mL (ref <10)
Benzodiazepines: NEGATIVE ng/mL (ref <0.50)
Buprenorphine: NEGATIVE ng/mL (ref <0.10)
Cocaine: NEGATIVE ng/mL (ref <5.0)
Codeine: NEGATIVE ng/mL (ref <2.5)
Cotinine: 53.9 ng/mL — ABNORMAL HIGH (ref <5.0)
Dihydrocodeine: 11.6 ng/mL — ABNORMAL HIGH (ref <2.5)
Fentanyl: NEGATIVE ng/mL (ref <0.10)
Heroin Metabolite: NEGATIVE ng/mL (ref <1.0)
Hydrocodone: 41.4 ng/mL — ABNORMAL HIGH (ref <2.5)
Hydromorphone: NEGATIVE ng/mL (ref <2.5)
MARIJUANA: NEGATIVE ng/mL (ref <2.5)
MDMA: NEGATIVE ng/mL (ref <10)
Meprobamate: NEGATIVE ng/mL (ref <2.5)
Methadone: NEGATIVE ng/mL (ref <5.0)
Morphine: NEGATIVE ng/mL (ref <2.5)
Nicotine Metabolite: POSITIVE ng/mL — AB (ref <5.0)
Norhydrocodone: 4 ng/mL — ABNORMAL HIGH (ref <2.5)
Noroxycodone: NEGATIVE ng/mL (ref <2.5)
Opiates: POSITIVE ng/mL — AB (ref <2.5)
Oxycodone: NEGATIVE ng/mL (ref <2.5)
Oxymorphone: NEGATIVE ng/mL (ref <2.5)
Phencyclidine: NEGATIVE ng/mL (ref <10)
Tapentadol: NEGATIVE ng/mL (ref <5.0)
Tramadol: NEGATIVE ng/mL (ref <5.0)
Zolpidem: NEGATIVE ng/mL (ref <5.0)

## 2019-04-12 LAB — DRUG TOX ALC METAB W/CON, ORAL FLD: Alcohol Metabolite: NEGATIVE ng/mL (ref <25)

## 2019-04-16 ENCOUNTER — Telehealth: Payer: Self-pay

## 2019-04-16 NOTE — Telephone Encounter (Signed)
Please review UDS results -NOT- consistent with medications on file. PMP shows Ambien and Hydro-Ace 5-325 dispensed monthly--present- Tramadol in screening- last RX'd [id number] # 20.

## 2019-05-06 ENCOUNTER — Other Ambulatory Visit: Payer: Self-pay | Admitting: Physical Medicine and Rehabilitation

## 2019-05-06 NOTE — Telephone Encounter (Signed)
Refill request for Hydrocodone/APAP last filled 04/04/2019 #90 next appt 05/10/2019

## 2019-05-10 ENCOUNTER — Encounter: Payer: Medicaid Other | Admitting: Physical Medicine and Rehabilitation

## 2019-05-13 ENCOUNTER — Encounter: Payer: Self-pay | Admitting: Physical Medicine and Rehabilitation

## 2019-05-13 ENCOUNTER — Encounter
Payer: Medicaid Other | Attending: Physical Medicine and Rehabilitation | Admitting: Physical Medicine and Rehabilitation

## 2019-05-13 ENCOUNTER — Other Ambulatory Visit: Payer: Self-pay

## 2019-05-13 VITALS — BP 131/91 | HR 118 | Temp 97.5°F | Ht 60.0 in | Wt 182.0 lb

## 2019-05-13 DIAGNOSIS — Z5181 Encounter for therapeutic drug level monitoring: Secondary | ICD-10-CM | POA: Insufficient documentation

## 2019-05-13 DIAGNOSIS — G43519 Persistent migraine aura without cerebral infarction, intractable, without status migrainosus: Secondary | ICD-10-CM

## 2019-05-13 DIAGNOSIS — R1084 Generalized abdominal pain: Secondary | ICD-10-CM | POA: Diagnosis present

## 2019-05-13 DIAGNOSIS — Z79891 Long term (current) use of opiate analgesic: Secondary | ICD-10-CM | POA: Insufficient documentation

## 2019-05-13 DIAGNOSIS — G894 Chronic pain syndrome: Secondary | ICD-10-CM | POA: Insufficient documentation

## 2019-05-13 DIAGNOSIS — M549 Dorsalgia, unspecified: Secondary | ICD-10-CM | POA: Diagnosis present

## 2019-05-13 DIAGNOSIS — G8929 Other chronic pain: Secondary | ICD-10-CM | POA: Diagnosis present

## 2019-05-13 DIAGNOSIS — M797 Fibromyalgia: Secondary | ICD-10-CM | POA: Insufficient documentation

## 2019-05-13 DIAGNOSIS — M7918 Myalgia, other site: Secondary | ICD-10-CM | POA: Diagnosis not present

## 2019-05-13 MED ORDER — LIDOCAINE 5 % EX PTCH: 3.0000 | MEDICATED_PATCH | CUTANEOUS | 11 refills | Status: DC

## 2019-05-13 NOTE — Patient Instructions (Signed)
1. Patient here for trigger point injections for myofascial pain  Consent done and on chart.  Cleaned areas with alcohol and injected using a 27 gauge 1.5 inch needle  Injected 6cc Using 1% Lidocaine with no EPI  Upper traps B/L Levators B/L Posterior scalenes B/L Middle scalenes Splenius Capitus B/L Pectoralis Major B/L Rhomboids B/L Infraspinatus Teres Major/minor Thoracic paraspinals Lumbar paraspinals B/L x2 Other injections-    Patient's level of pain prior was 10/10 Current level of pain after injections is- down to 9.5/10  There was no bleeding or complications.  Patient was advised to drink a lot of water on day after injections to flush system Will have increased soreness for 12-48 hours after injections.  Can use Lidocaine patches the day AFTER injections Can use theracane on day of injections in places didn't inject Can use heating pad 4-6 hours AFTER injections   2. Advised pt if gets any opiates/pain meds from anyone else, need to let us know or get from [place] due to opiate contract.   3.  Low dose naltrexone- will discuss further with pharmacy.  Will NOT work with pain meds- will write Rx for pt.   4. F/U in 1 month- before wedding 5/22

## 2019-05-13 NOTE — Progress Notes (Signed)
Subjective:    Patient ID: Tanya Krause, female    DOB: 1985/03/24, 34 y.o.   MRN: [id number]  HPI Patient is a 34 yr old female with fibromyalgia and myofascial pain syndrome here for trigger point injections and f/uand new dx of trochanteric bursitis on L s/p steroid injection last month.   Pain in L arm- starts at L upper shoulder/neck area- for ~ 1 month.  Crying it hurts so bad. Hard to left LUE at all.   Migraines hurting more.  Sits on toilet and feels like dizzy and will fall when gets up.        Pain Inventory Average Pain 8 Pain Right Now 10 My pain is sharp, burning, dull, stabbing, tingling and aching  In the last 24 hours, has pain interfered with the following? General activity 8 Relation with others 9 Enjoyment of life 3 What TIME of day is your pain at its worst? all Sleep (in general) Poor  Pain is worse with: walking, bending, sitting and inactivity Pain improves with: medication Relief from Meds: no answer  Mobility walk with assistance use a walker  Function disabled: date disabled .  Neuro/Psych numbness tingling trouble walking spasms depression anxiety  Prior Studies Any changes since last visit?  no  Physicians involved in your care Any changes since last visit?  no   Family History  Problem Relation Age of Onset  . Hypertension Mother   . Diabetes Mother   . Heart failure Father   . Colon cancer Paternal Grandfather    Social History   Socioeconomic History  . Marital status: Single    Spouse name: Not on file  . Number of children: 4  . Years of education: 47  . Highest education level: Not on file  Occupational History    Comment: unemployed  Tobacco Use  . Smoking status: Current Some Day Smoker    Packs/day: 0.14    Years: 5.00    Pack years: 0.70    Types: Cigarettes  . Smokeless tobacco: Never Used  . Tobacco comment: 3 cig daily  Substance and Sexual Activity  . Alcohol use: Not Currently  .  Drug use: Yes    Types: Marijuana    Comment: last used september 2020  . Sexual activity: Yes    Birth control/protection: Surgical  Other Topics Concern  . Not on file  Social History Narrative   Lives with parents, children   Caffeine use- coffee once a day, sodas x 2 a day   Social Determinants of Health   Financial Resource Strain:   . Difficulty of Paying Living Expenses:   Food Insecurity:   . Worried About Programme researcher, broadcasting/film/video in the Last Year:   . Barista in the Last Year:   Transportation Needs:   . Freight forwarder (Medical):   Marland Kitchen Lack of Transportation (Non-Medical):   Physical Activity:   . Days of Exercise per Week:   . Minutes of Exercise per Session:   Stress:   . Feeling of Stress :   Social Connections:   . Frequency of Communication with Friends and Family:   . Frequency of Social Gatherings with Friends and Family:   . Attends Religious Services:   . Active Member of Clubs or Organizations:   . Attends Banker Meetings:   Marland Kitchen Marital Status:    Past Surgical History:  Procedure Laterality Date  . HYSTERECTOMY ABDOMINAL WITH SALPINGO-OOPHORECTOMY Bilateral 02/06/2019  Procedure: HYSTERECTOMY ABDOMINAL WITH BILATERAL SALPINGO-OOPHORECTOMY;  Surgeon: Florian Buff, MD;  Location: [facility];  Service: Gynecology;  Laterality: Bilateral;  . kidney infections    . TUBAL LIGATION    . WISDOM TOOTH EXTRACTION     Past Medical History:  Diagnosis Date  . Asthma   . Chronic abdominal pain   . Chronic migraine   . Chronic pain syndrome   . Fibromyalgia   . 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 Hypertension   . Migraine   . Pelvic inflammatory disease (PID)    BP (!) 131/91   Pulse (!) 118   Temp (!) 97.5 F (36.4 C)   Ht 5' (1.524 m)   Wt 182 lb (82.6 kg)   LMP 12/04/2018 (Exact Date)   SpO2 98%   BMI 35.54 kg/m   Opioid Risk Score:   Fall Risk Score:  `1  Depression screen PHQ 2/9  Depression screen [facility] 2/9 12/25/2018  10/25/2018 09/03/2018 11/18/2014 11/18/2014  Decreased Interest 0 1 0 3 3  Down, Depressed, Hopeless 0 1 1 3 3   PHQ - 2 Score 0 2 1 6 6   Altered sleeping - - 3 3 -  Tired, decreased energy - - 3 3 -  Change in appetite - - 2 3 -  Feeling bad or failure about yourself  - - 0 3 -  Trouble concentrating - - 2 2 -  Moving slowly or fidgety/restless - - 0 3 -  Suicidal thoughts - - 0 0 -  PHQ-9 Score - - 11 23 -    Review of Systems  Constitutional: Negative.   HENT: Negative.   Eyes: Negative.   Respiratory: Negative.   Cardiovascular: Negative.   Gastrointestinal: Negative.   Endocrine: Negative.   Genitourinary: Negative.   Musculoskeletal: Positive for gait problem.       Spasms   Skin: Negative.   Allergic/Immunologic: Negative.   Neurological: Positive for numbness.       Tingling   Hematological: Negative.   Psychiatric/Behavioral: Positive for dysphoric mood. The patient is nervous/anxious.   All other systems reviewed and are negative.      Objective:   Physical Exam Awake, alert, appropriate, cringing in pain, accompanied by fiance', NAD  Esp tight L upper traps-  All muscles injected are very tight- trigger points     Assessment & Plan:    1. Patient here for trigger point injections for myofascial pain  Consent done and on chart.  Cleaned areas with alcohol and injected using a 27 gauge 1.5 inch needle  Injected 6cc Using 1% Lidocaine with no EPI  Upper traps B/L Levators B/L Posterior scalenes B/L Middle scalenes Splenius Capitus B/L Pectoralis Major B/L Rhomboids B/L Infraspinatus Teres Major/minor Thoracic paraspinals Lumbar paraspinals B/L x2 Other injections-    Patient's level of pain prior was 10/10 Current level of pain after injections is- down to 9.5/10  There was no bleeding or complications.  Patient was advised to drink a lot of water on day after injections to flush system Will have increased soreness for 12-48 hours after  injections.  Can use Lidocaine patches the day AFTER injections Can use theracane on day of injections in places didn't inject Can use heating pad 4-6 hours AFTER injections   2. Advised pt if gets any opiates/pain meds from anyone else, need to let us know or get from [place] due to opiate contract.   3.  Low dose naltrexone- will discuss further with  pharmacy.  Will NOT work with pain meds- will write Rx for pt.   4. F/U in 1 month- before wedding 5/22  5. Refilled Lidoderom gave 3 patches each use #90 11 RFs  I spent a total of 25 minutes on appointment discussing meds in addition to trigger point injections which took another 10 minutes

## 2019-05-30 ENCOUNTER — Other Ambulatory Visit: Payer: Self-pay | Admitting: Physical Medicine and Rehabilitation

## 2019-06-05 ENCOUNTER — Other Ambulatory Visit: Payer: Self-pay | Admitting: Physical Medicine and Rehabilitation

## 2019-06-05 ENCOUNTER — Encounter
Payer: Medicaid Other | Attending: Physical Medicine and Rehabilitation | Admitting: Physical Medicine and Rehabilitation

## 2019-06-05 DIAGNOSIS — Z79891 Long term (current) use of opiate analgesic: Secondary | ICD-10-CM | POA: Insufficient documentation

## 2019-06-05 DIAGNOSIS — M797 Fibromyalgia: Secondary | ICD-10-CM | POA: Insufficient documentation

## 2019-06-05 DIAGNOSIS — G894 Chronic pain syndrome: Secondary | ICD-10-CM | POA: Insufficient documentation

## 2019-06-05 DIAGNOSIS — M549 Dorsalgia, unspecified: Secondary | ICD-10-CM | POA: Insufficient documentation

## 2019-06-05 DIAGNOSIS — R1084 Generalized abdominal pain: Secondary | ICD-10-CM | POA: Insufficient documentation

## 2019-06-05 DIAGNOSIS — Z5181 Encounter for therapeutic drug level monitoring: Secondary | ICD-10-CM | POA: Insufficient documentation

## 2019-06-05 DIAGNOSIS — G8929 Other chronic pain: Secondary | ICD-10-CM | POA: Insufficient documentation

## 2019-06-05 NOTE — Telephone Encounter (Signed)
Refill Hydrocodone/[facility]

## 2019-06-19 ENCOUNTER — Encounter
Payer: Medicaid Other | Attending: Physical Medicine and Rehabilitation | Admitting: Physical Medicine and Rehabilitation

## 2019-06-19 ENCOUNTER — Encounter: Payer: Self-pay | Admitting: Physical Medicine and Rehabilitation

## 2019-06-19 ENCOUNTER — Other Ambulatory Visit: Payer: Self-pay

## 2019-06-19 VITALS — BP 133/100 | HR 113 | Temp 97.7°F | Ht 60.0 in | Wt 181.0 lb

## 2019-06-19 DIAGNOSIS — G894 Chronic pain syndrome: Secondary | ICD-10-CM

## 2019-06-19 DIAGNOSIS — Z5181 Encounter for therapeutic drug level monitoring: Secondary | ICD-10-CM

## 2019-06-19 DIAGNOSIS — R1084 Generalized abdominal pain: Secondary | ICD-10-CM | POA: Diagnosis present

## 2019-06-19 DIAGNOSIS — M549 Dorsalgia, unspecified: Secondary | ICD-10-CM | POA: Insufficient documentation

## 2019-06-19 DIAGNOSIS — G8929 Other chronic pain: Secondary | ICD-10-CM | POA: Insufficient documentation

## 2019-06-19 DIAGNOSIS — M7918 Myalgia, other site: Secondary | ICD-10-CM

## 2019-06-19 DIAGNOSIS — Z79891 Long term (current) use of opiate analgesic: Secondary | ICD-10-CM

## 2019-06-19 DIAGNOSIS — M797 Fibromyalgia: Secondary | ICD-10-CM | POA: Diagnosis not present

## 2019-06-19 MED ORDER — DULOXETINE HCL 60 MG PO CPEP: 60.0000 mg | ORAL_CAPSULE | Freq: Every day | ORAL | 11 refills | Status: DC

## 2019-06-19 MED ORDER — ESZOPICLONE 2 MG PO TABS: 2.0000 mg | ORAL_TABLET | Freq: Every day | ORAL | 5 refills | Status: DC

## 2019-06-19 MED ORDER — HYDROCODONE-ACETAMINOPHEN 5-325 MG PO TABS: ORAL_TABLET | ORAL | 0 refills | Status: DC

## 2019-06-19 MED ORDER — HYDROXYZINE HCL 25 MG PO TABS: 25.0000 mg | ORAL_TABLET | Freq: Three times a day (TID) | ORAL | 5 refills | Status: DC | PRN

## 2019-06-19 MED ORDER — PROMETHAZINE HCL 12.5 MG PO TABS
12.5000 mg | ORAL_TABLET | Freq: Four times a day (QID) | ORAL | 5 refills | Status: DC | PRN
Start: 2019-06-19 — End: 2020-04-24

## 2019-06-19 NOTE — Patient Instructions (Signed)
.   Patient here for trigger point injections for myofascial pain/fibromyalgia  Consent done and on chart.  Cleaned areas with alcohol and injected using a 27 gauge 1.5 inch needle  Injected 6cc Using 1% Lidocaine with no EPI  Upper traps B/L Levators B/L Posterior scalenes b/L Middle scalenes Splenius Capitus B/L Pectoralis Major B/L Rhomboids B/L x2 Infraspinatus Teres Major/minor Thoracic paraspinals B/L Lumbar paraspinals B/L x2 Other injections-    Patient's level of pain prior was Current level of pain after injections is  There was no bleeding or complications.  Patient was advised to drink a lot of water on day after injections to flush system Will have increased soreness for 12-48 hours after injections.  Can use Lidocaine patches the day AFTER injections Can use theracane on day of injections in places didn't inject Can use heating pad 4-6 hours AFTER injections  2. Refill Duloxetine- 60 mg daily- if makes nauseated, call me and will start at lower dose and work up- if needed- can try higher dose in future. Is FDA approved for nerve pain AND depression/anxiety.    3. Refill Phenergan for nausea as needed- #60 changed to tab for pt/per her request.   4. Can cut Norco in half- for pain- and can take with phenergan to help with nausea  5. Refill Hydroxyzine 25 mg 3x/day as needed for anxiety.   6. Needs UDS per clinic policy.   7. F/U in 4 weeks- trigger point injections

## 2019-06-19 NOTE — Progress Notes (Signed)
  Very sore- ready for trigger point injections- missed appointment last week,   Son just graduated- just got married on 5/22 to GF- Changed last name to Tanya Krause- is doing paperwork.  Not sleeping as well- has been on Ambien for "years"- ~ 5 years- wonderng if there's anything else to try.  Tried trazodone- didn't work Elavil- made her mouth so dry, couldn't tolerate -got thrush as well  Was on Seroquel and Xanax for anxiety Ran out of Duloxetine- and not taking.  Has been out of it for awhile.  Asking about higher dose  Has been halfing the Norco- makes her nauseated in AM when takes it-     1. Patient here for trigger point injections for myofascial pain/fibromyalgia  Consent done and on chart.  Cleaned areas with alcohol and injected using a 27 gauge 1.5 inch needle  Injected 6cc Using 1% Lidocaine with no EPI  Upper traps B/L Levators B/L Posterior scalenes b/L Middle scalenes Splenius Capitus B/L Pectoralis Major B/L Rhomboids B/L x2 Infraspinatus Teres Major/minor Thoracic paraspinals B/L Lumbar paraspinals B/L x2 Other injections-    Patient's level of pain prior was Current level of pain after injections is  There was no bleeding or complications.  Patient was advised to drink a lot of water on day after injections to flush system Will have increased soreness for 12-48 hours after injections.  Can use Lidocaine patches the day AFTER injections Can use theracane on day of injections in places didn't inject Can use heating pad 4-6 hours AFTER injections  2. Refill Duloxetine- 60 mg daily- if makes nauseated, call me and will start at lower dose and work up- if needed- can try higher dose in future. Is FDA approved for nerve pain AND depression/anxiety.    3. Refill Phenergan for nausea as needed- #60 changed to tab for pt/per her request.   4. Can cut Norco in half- for pain- and can take with phenergan to help with nausea  5. Refill Hydroxyzine 25 mg  3x/day as needed for anxiety.   6. Needs UDS per clinic policy.   7. F/U in 4 weeks- trigger point injections  I spent a total of 45 minutes on appointment- as detailed above.

## 2019-06-22 LAB — TOXASSURE SELECT,+ANTIDEPR,UR

## 2019-06-26 ENCOUNTER — Telehealth: Payer: Self-pay | Admitting: *Deleted

## 2019-06-26 NOTE — Telephone Encounter (Signed)
Urine drug screen once again shows metabolites of marijuana.  Hydrocodone is present as expected.

## 2019-06-27 ENCOUNTER — Telehealth: Payer: Self-pay

## 2019-06-27 NOTE — Telephone Encounter (Signed)
1.Patient called most recent  UDS results.   2.Patient also c/o extreme swelling all over her body (hands, feet, ankles & legs)  for 3 days. So bad it hurts to close her hands.   Patient advised to call her PCP or go to an urgent care for follow up.

## 2019-06-28 NOTE — Telephone Encounter (Signed)
Please send letter saying she's at risk for losing her meds- thank you, ML

## 2019-07-01 NOTE — Telephone Encounter (Signed)
Formal Warning Letter sent via MyChart and will be mailed through USPS as well.

## 2019-07-06 ENCOUNTER — Other Ambulatory Visit: Payer: Self-pay | Admitting: Physical Medicine and Rehabilitation

## 2019-07-17 ENCOUNTER — Other Ambulatory Visit: Payer: Self-pay

## 2019-07-17 ENCOUNTER — Encounter: Payer: Medicaid Other | Admitting: Physical Medicine and Rehabilitation

## 2019-07-17 ENCOUNTER — Encounter: Payer: Self-pay | Admitting: Physical Medicine and Rehabilitation

## 2019-07-17 VITALS — BP 122/70 | HR 122 | Temp 98.5°F | Ht 60.0 in | Wt 184.0 lb

## 2019-07-17 DIAGNOSIS — M7918 Myalgia, other site: Secondary | ICD-10-CM

## 2019-07-17 DIAGNOSIS — M797 Fibromyalgia: Secondary | ICD-10-CM

## 2019-07-17 DIAGNOSIS — G43519 Persistent migraine aura without cerebral infarction, intractable, without status migrainosus: Secondary | ICD-10-CM | POA: Diagnosis not present

## 2019-07-17 DIAGNOSIS — G894 Chronic pain syndrome: Secondary | ICD-10-CM | POA: Diagnosis not present

## 2019-07-17 DIAGNOSIS — G4701 Insomnia due to medical condition: Secondary | ICD-10-CM | POA: Diagnosis not present

## 2019-07-17 MED ORDER — HYDROCODONE-ACETAMINOPHEN 5-325 MG PO TABS: ORAL_TABLET | ORAL | 0 refills | Status: DC

## 2019-07-17 MED ORDER — PREGABALIN 150 MG PO CAPS: 150.0000 mg | ORAL_CAPSULE | Freq: Two times a day (BID) | ORAL | 5 refills | Status: DC

## 2019-07-17 MED ORDER — ESZOPICLONE 2 MG PO TABS: 2.0000 mg | ORAL_TABLET | Freq: Every day | ORAL | 5 refills | Status: DC

## 2019-07-17 MED ORDER — NYSTATIN 100000 UNIT/ML MT SUSP: 5.0000 mL | Freq: Four times a day (QID) | OROMUCOSAL | 1 refills | Status: DC

## 2019-07-17 NOTE — Patient Instructions (Signed)
Plan: 1. Suggest no screen time within 1 hour of sleep and no caffeine past 1 pm.    2. Will refill Norco- is due 7/1 to be refilled 5/325 mg #90  3. Refill Lyrica 150 mg BID- 5 RFs  4. Patient here for trigger point injections for  Consent done and on chart.  Cleaned areas with alcohol and injected using a 27 gauge 1.5 inch needle  Injected 6cc Using 1% Lidocaine with no EPI  Upper traps B/L Levators B/L Posterior scalenes B/L Middle scalenes Splenius Capitus B/L Pectoralis Major B/L Rhomboids B/L x2 Infraspinatus Teres Major/minor Thoracic paraspinals Lumbar paraspinals B/L Other injections- B/L thenar trigger points   Patient's level of pain prior was 10/10 Current level of pain after injections is- 9/10- come down a little  There was no bleeding or complications.  Patient was advised to drink a lot of water on day after injections to flush system Will have increased soreness for 12-48 hours after injections.  Can use Lidocaine patches the day AFTER injections Can use theracane on day of injections in places didn't inject Can use heating pad 4-6 hours AFTER injections   6. Goal- until gets home- take the edge off- can take tylenol- up to 4 grams/day for short term-(8 tabs/day) and benadryl-  Up to 75 mg (25 mg tablets)-  up to 3x/day- max for nausea/sleep  7. Think (+) marijuana from CBD oil- make sure <3% THC Says she doesn't want to do anymore- because doesn't want pt get into trouble.   8. F/U in 4 weeks-trigger point injections.    9. Naltrexone- low dose naltrexone- 3-5 mg daily- has to be COMPOUNDED- find out how much/month.  If pt calls back, wanting Naltrexone- low dose, give her 4 mg daily- - have to call in Rx to her pharmacy.    10 .Nystatin refill as needed- reordered with 1 refill.
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
## 2019-07-24 ENCOUNTER — Telehealth: Payer: Self-pay

## 2019-07-24 NOTE — Telephone Encounter (Addendum)
Patient called to report :   That she was on her way to the ED.  For 2 days she has had  pain from the top of her head, down her neck, down to her feet with numbness.   Patient is aware Dr. Berline Chough is not in the office. She just wanted her condition noted.

## 2019-07-29 NOTE — Telephone Encounter (Signed)
Pt went to the ED 2x in 24 hours due to sickle cell pain crisis.    She NEEDS to have her hydrocodone covered by insurance- she's been on it for months, and there's no reason to make pt wait to get her refill.   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   Will continue to try and get pt's pain meds covered by insurance.

## 2019-07-30 ENCOUNTER — Telehealth: Payer: Self-pay | Admitting: *Deleted

## 2019-07-30 NOTE — Telephone Encounter (Signed)
Documentation sent

## 2019-07-30 NOTE — Telephone Encounter (Signed)
[facility] pharmacy is asking for permission for early refill of ambien.  She states patient is currently 8 days early for refill. She asks if any specific date would be preferred for early refill, if applicable.heir phone number is [phone].  [facility] phone number is [phone].

## 2019-07-30 NOTE — Telephone Encounter (Signed)
Pt was told to STOP using Ambien- since gave her Lunesta in place of it- so no- I called [facility] and d/c'd that Rx, completely. I told her to stop that medicine a few months ago, and appears she was getting BOTH refilled.   Also, Lunesta-for sleep,  [employer] would only cover 15 pills/month, so I increased dose to 3 mg and let her take 1/2 tab nightly so has something for 30 nights/month.   Also found out she paid cash to fill Norco since didn't want to wait for prior authorization. Which is fine, but was filled- we still need to get prior auth done for next month.   I'm gone for next few days, fyi.

## 2019-08-28 ENCOUNTER — Ambulatory Visit: Payer: Medicaid Other | Admitting: Physical Medicine and Rehabilitation

## 2019-08-28 ENCOUNTER — Other Ambulatory Visit: Payer: Self-pay | Admitting: Physical Medicine and Rehabilitation

## 2019-08-29 ENCOUNTER — Telehealth: Payer: Self-pay

## 2019-08-29 NOTE — Telephone Encounter (Signed)
Naraly Mooney called :   Per Patient  On 08/23/2019 she had 2 teeth removed by Dr. Fidela Salisbury  (Oral Surgeon) in [place] . After the procedure Dr. Fidela Salisbury did not prescribe pain medication.  Because of her pain agreement here at [facility].  After review of chart her Oxycodone will be available today. Patient is aware that the Rx is available. Maybe it will help with pain relief.   Please advise if needed.

## 2019-08-29 NOTE — Telephone Encounter (Signed)
If her new Rx isn't helpful, let me know, but if it was done 8/6, she should be feeling better pretty soon.

## 2019-09-18 ENCOUNTER — Telehealth: Payer: Self-pay

## 2019-09-18 NOTE — Telephone Encounter (Signed)
Patient was seen in the ED on yesterday for a Sickle Cell Crisis.  Per patient the pain is so bad she can not move her body. She has no transportation. She wants to know what can she do?    Patient advise to contact the local Sickle Cell Center at [facility] for condition. They have a Day Clinic that may can help.    Patient is aware you are not in the office this aftenoon. Call back ph# [phone]

## 2019-09-19 ENCOUNTER — Telehealth: Payer: Self-pay | Admitting: Physical Medicine and Rehabilitation

## 2019-09-19 NOTE — Telephone Encounter (Signed)
Patient would like med refil prior to her apt on 9/16 she states she was recently released from Hospital. She is requesting a call back from Dr. Berline Chough, she was advised that Dr Berline Chough is not in the office today and she would be returning tomorrow.

## 2019-09-19 NOTE — Telephone Encounter (Signed)
D/w pt- doesn't want meds EARLY_ just looking to let me know went to the hospital, got IVFs x2 bags- just wanted to see when meds due told her 9/11- and can refill then.

## 2019-09-30 ENCOUNTER — Other Ambulatory Visit: Payer: Self-pay

## 2019-09-30 ENCOUNTER — Encounter: Payer: Self-pay | Admitting: Physical Medicine and Rehabilitation

## 2019-09-30 ENCOUNTER — Encounter
Payer: Medicaid Other | Attending: Physical Medicine and Rehabilitation | Admitting: Physical Medicine and Rehabilitation

## 2019-09-30 VITALS — BP 138/89 | HR 110 | Temp 98.8°F | Ht 60.0 in | Wt 177.0 lb

## 2019-09-30 DIAGNOSIS — R1084 Generalized abdominal pain: Secondary | ICD-10-CM | POA: Diagnosis present

## 2019-09-30 DIAGNOSIS — M797 Fibromyalgia: Secondary | ICD-10-CM | POA: Insufficient documentation

## 2019-09-30 DIAGNOSIS — Z5181 Encounter for therapeutic drug level monitoring: Secondary | ICD-10-CM | POA: Insufficient documentation

## 2019-09-30 DIAGNOSIS — M549 Dorsalgia, unspecified: Secondary | ICD-10-CM | POA: Insufficient documentation

## 2019-09-30 DIAGNOSIS — M7918 Myalgia, other site: Secondary | ICD-10-CM

## 2019-09-30 DIAGNOSIS — G4701 Insomnia due to medical condition: Secondary | ICD-10-CM | POA: Diagnosis not present

## 2019-09-30 DIAGNOSIS — Z79891 Long term (current) use of opiate analgesic: Secondary | ICD-10-CM | POA: Diagnosis present

## 2019-09-30 DIAGNOSIS — G894 Chronic pain syndrome: Secondary | ICD-10-CM

## 2019-09-30 DIAGNOSIS — G8929 Other chronic pain: Secondary | ICD-10-CM | POA: Diagnosis present

## 2019-09-30 MED ORDER — CYCLOBENZAPRINE HCL 10 MG PO TABS
ORAL_TABLET | ORAL | 5 refills | Status: DC
Start: 2019-09-30 — End: 2020-09-24

## 2019-09-30 MED ORDER — ZOLPIDEM TARTRATE 5 MG PO TABS: 5.0000 mg | ORAL_TABLET | Freq: Every evening | ORAL | 5 refills | Status: DC | PRN

## 2019-09-30 MED ORDER — HYDROCODONE-ACETAMINOPHEN 5-325 MG PO TABS
ORAL_TABLET | ORAL | 0 refills | Status: DC
Start: 2019-09-30 — End: 2019-10-30

## 2019-09-30 NOTE — Progress Notes (Signed)
Patient is is 34 yr old female with hx of chronic pain syndrome- with myofascila pain syndrome, fibromyalgia here for f/u and trigger point injections.    Son went to college- have 46 yr old daughter staying with her all the time.  Mother handicapped- "from brain down" and father is getting "old".   Wife and mother are letting her use their care.  Married life going well.    Is on Lunesta- only gets 10 pills/month- makes her throw up every time she takes it- bad taste in her mouth for 2-3 days.    Talked to Sickle cell pain clinic- wanted to go there instead of ER.       Plan: 1. Ambien  Rx sent in- will stop Lunesta.   2. Discussed Low dose naltrexone. If take,s will interfere with Norco, however I've had great success with fibromyalgia pain.   3. Patient here for trigger point injections for  Consent done and on chart.  Cleaned areas with alcohol and injected using a 27 gauge 1.5 inch needle  Injected  Using 1% Lidocaine with no EPI  Upper traps Levators Posterior scalenes Middle scalenes Splenius Capitus Pectoralis Major Rhomboids Infraspinatus Teres Major/minor Thoracic paraspinals Lumbar paraspinals Other injections-    Patient's level of pain prior was Current level of pain after injections is  There was no bleeding or complications.  Patient was advised to drink a lot of water on day after injections to flush system Will have increased soreness for 12-48 hours after injections.  Can use Lidocaine patches the day AFTER injections Can use theracane on day of injections in places didn't inject Can use heating pad 4-6 hours AFTER injections    4 Refill Norco - sent in 100 tabs this month  5. Refilled flexeril 10 mg 3x/day as needed   6. F/U in 1 month    I spent a total of 40  minutes on  Visit- as detailed above.

## 2019-09-30 NOTE — Patient Instructions (Addendum)
Plan: 1. Ambien  Rx sent in- will stop Lunesta.   2. Discussed Low dose naltrexone. If takes will interfere with Norco, however I've had great success with fibromyalgia pain.  Is around $35/month   3. Patient here for trigger point injections for  Consent done and on chart.  Cleaned areas with alcohol and injected using a 27 gauge 1.5 inch needle  Injected  Using 1% Lidocaine with no EPI  Upper traps Levators Posterior scalenes Middle scalenes Splenius Capitus Pectoralis Major Rhomboids Infraspinatus Teres Major/minor Thoracic paraspinals Lumbar paraspinals Other injections-    Patient's level of pain prior was Current level of pain after injections is  There was no bleeding or complications.  Patient was advised to drink a lot of water on day after injections to flush system Will have increased soreness for 12-48 hours after injections.  Can use Lidocaine patches the day AFTER injections Can use theracane on day of injections in places didn't inject Can use heating pad 4-6 hours AFTER injections    4 Refill Norco - sent in 100 tabs this month  5. Refilled flexeril 10 mg 3x/day as needed   6. F/U in 1 month

## 2019-10-08 NOTE — Telephone Encounter (Signed)
In error

## 2019-10-10 ENCOUNTER — Other Ambulatory Visit: Payer: Self-pay | Admitting: Physical Medicine and Rehabilitation

## 2019-10-30 ENCOUNTER — Other Ambulatory Visit: Payer: Self-pay | Admitting: Physical Medicine and Rehabilitation

## 2019-11-01 ENCOUNTER — Encounter
Payer: Medicaid Other | Attending: Physical Medicine and Rehabilitation | Admitting: Physical Medicine and Rehabilitation

## 2019-11-01 ENCOUNTER — Other Ambulatory Visit: Payer: Self-pay

## 2019-11-01 ENCOUNTER — Encounter: Payer: Self-pay | Admitting: Physical Medicine and Rehabilitation

## 2019-11-01 VITALS — BP 131/95 | HR 101 | Temp 98.0°F | Ht 60.0 in | Wt 184.6 lb

## 2019-11-01 DIAGNOSIS — G43519 Persistent migraine aura without cerebral infarction, intractable, without status migrainosus: Secondary | ICD-10-CM | POA: Diagnosis not present

## 2019-11-01 DIAGNOSIS — G894 Chronic pain syndrome: Secondary | ICD-10-CM | POA: Diagnosis present

## 2019-11-01 DIAGNOSIS — Z79891 Long term (current) use of opiate analgesic: Secondary | ICD-10-CM | POA: Diagnosis not present

## 2019-11-01 DIAGNOSIS — M7918 Myalgia, other site: Secondary | ICD-10-CM | POA: Insufficient documentation

## 2019-11-01 DIAGNOSIS — M797 Fibromyalgia: Secondary | ICD-10-CM | POA: Diagnosis present

## 2019-11-01 DIAGNOSIS — Z5181 Encounter for therapeutic drug level monitoring: Secondary | ICD-10-CM | POA: Insufficient documentation

## 2019-11-01 MED ORDER — BUSPIRONE HCL 5 MG PO TABS: 5.0000 mg | ORAL_TABLET | Freq: Three times a day (TID) | ORAL | 5 refills | Status: DC

## 2019-11-01 NOTE — Patient Instructions (Signed)
Plan: 1. Patient here for trigger point injections for  Consent done and on chart.  Cleaned areas with alcohol and injected using a 27 gauge 1.5 inch needle  Injected  6cc Using 1% Lidocaine with no EPI  Upper traps B/L Levators B/L Posterior scalenes B/L x2 Middle scalenes Splenius Capitus B/L Pectoralis Major B/L Rhomboids B/L Infraspinatus Teres Major/minor Thoracic paraspinals B/L Lumbar paraspinals B/L x2 Other injections- B/L tibialis anterior   Patient's level of pain prior was 9-10/10 Current level of pain after injections is 8-9/10- moving better  There was no bleeding or complications.  Patient was advised to drink a lot of water on day after injections to flush system Will have increased soreness for 12-48 hours after injections.  Can use Lidocaine patches the day AFTER injections Can use theracane on day of injections in places didn't inject Can use heating pad 4-6 hours AFTER injections  2. Just did refill for Norco- missing 5 pils/from 2 days. No need for refills yet.   3. Buspar 5mg  TID for anxiety- explained doesn't work like Ativan, etc which treat immediately, but no one will write for while on pain meds- will help more prevention of anxiety over time.    4. F/U in 1 months for trigger point injections.  5. Pt applying for disability- I truly believe she wil not be able to work for the forseeable future.  Due to Fibromyalgia, myofascial pain, chronic pain as well as Anxiety and depression.

## 2019-11-01 NOTE — Progress Notes (Signed)
Patient is is 34 yr old female with hx of chronic pain syndrome- with myofascila pain syndrome, fibromyalgia here for f/u and trigger point injections.   Nothing new since last months.  Son getting on nerves- and blocked him because trying to get money- "wishes she was dead, etc".    Married life going great.    Rough time sleeping due to pain, etc.  Last 2 weeks- started to  aggravate her.  Switching back to Ambien  Was more helpful, but doesn't go to sleep until 3am overall.   Chest tight and think anxiety is more of an issue. Doesn't have benzo's for anxiety anymore.  Thinks might have tried Buspar in past.    Exam: Tight in anterior tibialis B/L and B/L hamstrings and soleus as well B/L  Plan: 1. Patient here for trigger point injections for  Consent done and on chart.  Cleaned areas with alcohol and injected using a 27 gauge 1.5 inch needle  Injected  6cc Using 1% Lidocaine with no EPI  Upper traps B/L Levators B/L Posterior scalenes B/L x2 Middle scalenes Splenius Capitus B/L Pectoralis Major B/L Rhomboids B/L Infraspinatus Teres Major/minor Thoracic paraspinals B/L Lumbar paraspinals B/L x2 Other injections- B/L tibialis anterior   Patient's level of pain prior was 9-10/10 Current level of pain after injections is 8-9/10- moving better  There was no bleeding or complications.  Patient was advised to drink a lot of water on day after injections to flush system Will have increased soreness for 12-48 hours after injections.  Can use Lidocaine patches the day AFTER injections Can use theracane on day of injections in places didn't inject Can use heating pad 4-6 hours AFTER injections  2. Just did refill for Norco- missing 5 pils/from 2 days. No need for refills yet.   3. Buspar 5mg  TID for anxiety- explained doesn't work like Ativan, etc which treat immediately, but no one will write for while on pain meds- will help more prevention of anxiety over time.     4. F/U in 1 months for trigger point injections.    5. Pt applying for disability- I truly believe she wil not be able to work for the forseeable future.  Due to Fibromyalgia, myofascial pain, chronic pain as well as Anxiety and depression.

## 2019-11-07 LAB — TOXASSURE SELECT,+ANTIDEPR,UR

## 2019-11-11 ENCOUNTER — Telehealth: Payer: Self-pay | Admitting: *Deleted

## 2019-11-11 NOTE — Telephone Encounter (Signed)
Urine drug screen for this encounter is consistent for prescribed medication, but is also positive for THC. She received a formal warning letter in June of this year about THC/marijuana in her drug screens.

## 2019-11-21 NOTE — Telephone Encounter (Signed)
FINAL WARNING letter for marijuana mailed and sent through [facility].

## 2019-11-21 NOTE — Telephone Encounter (Signed)
Unfortunately, we need to send another formal warning- and tell her it's 3x and she's "out". I would still take care of her, but not write pain meds.

## 2019-11-29 ENCOUNTER — Other Ambulatory Visit: Payer: Self-pay | Admitting: Physical Medicine and Rehabilitation

## 2019-12-04 ENCOUNTER — Telehealth (HOSPITAL_COMMUNITY): Payer: Self-pay | Admitting: General Practice

## 2019-12-04 ENCOUNTER — Encounter: Payer: Medicaid Other | Admitting: Physical Medicine and Rehabilitation

## 2019-12-04 NOTE — Telephone Encounter (Signed)
Patient called  due to 10/10  generalized pain. The center has no capacity to admit the patient today. Patient told to continue to take medications as prescribed, hydrate with 64 ounces of water, go to the ER if the pain is unbearable. Patient is a first time caller Patient may also call back at 08:00 am tomorrow morning. Patient verbalized understanding.

## 2019-12-05 ENCOUNTER — Telehealth (HOSPITAL_COMMUNITY): Payer: Self-pay

## 2019-12-05 NOTE — Telephone Encounter (Signed)
Patient called in. Complains of sickle cell pain, and wants to be seen in day hospital today. Explained to pt that she cannot come in today due to the center being closed this afternoon. Encouraged pt to go to the ED for urgent issues and pt can call day hospital for treatment tomorrow if pain persists . Pt verbalized understanding.

## 2019-12-19 ENCOUNTER — Emergency Department (HOSPITAL_COMMUNITY)
Admission: EM | Admit: 2019-12-19 | Discharge: 2019-12-19 | Disposition: A | Discharge status: Home / Self Care | Payer: Medicaid Other | Attending: Emergency Medicine | Admitting: Emergency Medicine

## 2019-12-19 ENCOUNTER — Telehealth (HOSPITAL_COMMUNITY): Payer: Self-pay | Admitting: General Practice

## 2019-12-19 ENCOUNTER — Other Ambulatory Visit: Payer: Self-pay

## 2019-12-19 ENCOUNTER — Telehealth: Payer: Self-pay | Admitting: Family Medicine

## 2019-12-19 DIAGNOSIS — Z79899 Other long term (current) drug therapy: Secondary | ICD-10-CM | POA: Insufficient documentation

## 2019-12-19 DIAGNOSIS — I10 Essential (primary) hypertension: Secondary | ICD-10-CM | POA: Insufficient documentation

## 2019-12-19 DIAGNOSIS — J45909 Unspecified asthma, uncomplicated: Secondary | ICD-10-CM | POA: Insufficient documentation

## 2019-12-19 DIAGNOSIS — F17210 Nicotine dependence, cigarettes, uncomplicated: Secondary | ICD-10-CM | POA: Insufficient documentation

## 2019-12-19 DIAGNOSIS — D5700 Hb-SS disease with crisis, unspecified: Secondary | ICD-10-CM | POA: Diagnosis present

## 2019-12-19 DIAGNOSIS — Z91040 Latex allergy status: Secondary | ICD-10-CM | POA: Insufficient documentation

## 2019-12-19 LAB — COMPREHENSIVE METABOLIC PANEL
ALT: 12 U/L (ref 0–44)
AST: 23 U/L (ref 15–41)
Albumin: 3.8 g/dL (ref 3.5–5.0)
Alkaline Phosphatase: 44 U/L (ref 38–126)
Anion gap: 10 (ref 5–15)
BUN: 5 mg/dL — ABNORMAL LOW (ref 6–20)
CO2: 21 mmol/L — ABNORMAL LOW (ref 22–32)
Calcium: 8.4 mg/dL — ABNORMAL LOW (ref 8.9–10.3)
Chloride: 107 mmol/L (ref 98–111)
Creatinine, Ser: 0.72 mg/dL (ref 0.44–1.00)
GFR, Estimated: >60 mL/min (ref >60)
Glucose, Bld: 116 mg/dL — ABNORMAL HIGH (ref 70–99)
Potassium: 4 mmol/L (ref 3.5–5.1)
Sodium: 138 mmol/L (ref 135–145)
Total Bilirubin: 0.7 mg/dL (ref 0.3–1.2)
Total Protein: 6.9 g/dL (ref 6.5–8.1)

## 2019-12-19 LAB — CBC WITH DIFFERENTIAL/PLATELET
Abs Immature Granulocytes: 0.03 10*3/uL (ref 0.00–0.07)
Basophils Absolute: 0.1 10*3/uL (ref 0.0–0.1)
Basophils Relative: 1 %
Eosinophils Absolute: 0.2 10*3/uL (ref 0.0–0.5)
Eosinophils Relative: 2 %
HCT: 37.8 % (ref 36.0–46.0)
Hemoglobin: 12.9 g/dL (ref 12.0–15.0)
Immature Granulocytes: 0 %
Lymphocytes Relative: 44 %
Lymphs Abs: 4.2 10*3/uL — ABNORMAL HIGH (ref 0.7–4.0)
MCH: 28.4 pg (ref 26.0–34.0)
MCHC: 34.1 g/dL (ref 30.0–36.0)
MCV: 83.3 fL (ref 80.0–100.0)
Monocytes Absolute: 0.6 10*3/uL (ref 0.1–1.0)
Monocytes Relative: 6 %
Neutro Abs: 4.3 10*3/uL (ref 1.7–7.7)
Neutrophils Relative %: 47 %
Platelets: 299 10*3/uL (ref 150–400)
RBC: 4.54 MIL/uL (ref 3.87–5.11)
RDW: 13.7 % (ref 11.5–15.5)
WBC: 9.4 10*3/uL (ref 4.0–10.5)
nRBC: 0 % (ref 0.0–0.2)

## 2019-12-19 LAB — URINALYSIS, ROUTINE W REFLEX MICROSCOPIC
Bilirubin Urine: NEGATIVE
Glucose, UA: NEGATIVE mg/dL
Hgb urine dipstick: NEGATIVE
Ketones, ur: NEGATIVE mg/dL
Leukocytes,Ua: NEGATIVE
Nitrite: NEGATIVE
Protein, ur: NEGATIVE mg/dL
Specific Gravity, Urine: 1.024 (ref 1.005–1.030)
pH: 6 (ref 5.0–8.0)

## 2019-12-19 LAB — RETICULOCYTES
Immature Retic Fract: 24.4 % — ABNORMAL HIGH (ref 2.3–15.9)
RBC.: 4.45 MIL/uL (ref 3.87–5.11)
Retic Count, Absolute: 78.3 10*3/uL (ref 19.0–186.0)
Retic Ct Pct: 1.8 % (ref 0.4–3.1)

## 2019-12-19 LAB — I-STAT BETA HCG BLOOD, ED (MC, WL, AP ONLY): I-stat hCG, quantitative: 9.1 m[IU]/mL — ABNORMAL HIGH (ref <5)

## 2019-12-19 MED ORDER — ONDANSETRON HCL 4 MG/2ML IJ SOLN
4.0000 mg | Freq: Once | INTRAMUSCULAR | Status: AC
  Administered 2019-12-19: 4 mg via INTRAVENOUS
  Filled 2019-12-19: qty 2

## 2019-12-19 MED ORDER — HYDROMORPHONE HCL 1 MG/ML IJ SOLN: 1.0000 mg | Freq: Once | INTRAMUSCULAR | Status: DC

## 2019-12-19 MED ORDER — HYDROMORPHONE HCL 2 MG/ML IJ SOLN
INTRAMUSCULAR | Status: AC
  Administered 2019-12-19: 2 mg via INTRAVENOUS
  Filled 2019-12-19: qty 1

## 2019-12-19 MED ORDER — DIPHENHYDRAMINE HCL 50 MG/ML IJ SOLN
25.0000 mg | Freq: Once | INTRAMUSCULAR | Status: AC
  Administered 2019-12-19: 25 mg via INTRAVENOUS
  Filled 2019-12-19: qty 1

## 2019-12-19 MED ORDER — HYDROMORPHONE HCL 2 MG/ML IJ SOLN
2.0000 mg | Freq: Once | INTRAMUSCULAR | Status: AC
  Administered 2019-12-19: 2 mg via INTRAVENOUS
  Filled 2019-12-19: qty 1

## 2019-12-19 MED ORDER — PROMETHAZINE HCL 25 MG/ML IJ SOLN
25.0000 mg | Freq: Once | INTRAMUSCULAR | Status: AC
  Administered 2019-12-19: 25 mg via INTRAVENOUS
  Filled 2019-12-19: qty 1

## 2019-12-19 MED ORDER — DEXTROSE-NACL 5-0.45 % IV SOLN: INTRAVENOUS | Status: DC

## 2019-12-19 MED ORDER — HYDROMORPHONE HCL 2 MG/ML IJ SOLN: 2.0000 mg | INTRAMUSCULAR | Status: AC

## 2019-12-19 MED ORDER — HYDROMORPHONE HCL 2 MG/ML IJ SOLN
2.0000 mg | INTRAMUSCULAR | Status: AC
  Administered 2019-12-19: 2 mg via INTRAVENOUS
  Filled 2019-12-19: qty 1

## 2019-12-19 NOTE — ED Triage Notes (Signed)
Pt reports that she had an appointment at the sickle cell center at 8a; her transportation was late and when she arrived at 41; she was told to come to the ED because it was after 1pm. Pt reports sickle cell crisis with pain all over and tachycardic.

## 2019-12-19 NOTE — Telephone Encounter (Signed)
Tanya Krause is a 34 year old female that contacted sickle cell day infusion clinic for admission due to sickle cell pain crisis.  On review of patient's record, there is no history of sickle cell disease.  Patient has frequented the emergency department at [facility] and [facility] without a definitive diagnosis of sickle cell disease.  Reviewed previous primary care records, showed history of fibromyalgia, chronic pain syndrome, and myofascial pain syndrome.  It appears that patient has been receiving pain medications from Dr. Genice Rouge.  Her last office visit was on 11/01/2019.  Patient recently filled a prescription for hydrocodone-acetaminophen 5-325 mg #90 on 11/30/2019.  On review of PDMP, no inconsistencies were noted.  Attempted to call patient to advise her to continue with her PCP for medication management.  There are no pain management providers here.  This clinic is specifically dedicated the patient's living will sickle cell disease that are experiencing acute sickle cell pain crises.  Also, if patient is concerned about possible diagnosis of sickle cell disease, again she can contact PCP for a hemoglobin fractionation. Attempted to reach patient several times without an answer. Voicemail has not been set up.   Nolon Nations  APRN, MSN, FNP-C Patient Care [facility] [address] [place], [place] [address] [phone]

## 2019-12-19 NOTE — Discharge Instructions (Addendum)
You were evaluated in the Emergency Department and after careful evaluation, we did not find any emergent condition requiring admission or further testing in the hospital.  Your exam/testing today was overall reassuring.  Please return to the Emergency Department if you experience any worsening of your condition.  Thank you for allowing us to be a part of your care.  

## 2019-12-19 NOTE — ED Provider Notes (Signed)
[facility]-EMERGENCY DEPT Provider Note   CSN: Krause Arrival date & time: 12/19/19  1320     History Chief Complaint  Patient presents with  . Sickle Cell Pain Crisis    Tanya Krause is a 35 y.o. female.  The history is provided by the patient and medical records. No language interpreter was used.  Sickle Cell Pain Crisis Location:  Back and lower extremity Severity:  Severe Onset quality:  Gradual Duration:  3 days Similar to previous crisis episodes: yes   Timing:  Constant Progression:  Worsening Chronicity:  Recurrent Relieved by:  Nothing Worsened by:  Nothing Ineffective treatments:  Prescription drugs Associated symptoms: no chest pain, no congestion, no cough, no fatigue, no fever, no headaches, no leg ulcers, no nausea, no shortness of breath, no swelling of legs, no vomiting and no wheezing        Past Medical History:  Diagnosis Date  . Asthma   . Chronic abdominal pain   . Chronic migraine   . Chronic pain syndrome   . Fibromyalgia   . 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 Hypertension   . Migraine   . Pelvic inflammatory disease (PID)     Patient Active Problem List   Diagnosis Date Noted  . Insomnia due to medical condition 07/17/2019  . Encounter for long-term use of opiate analgesic 04/08/2019  . Encounter for medication monitoring 04/08/2019  . 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 Trochanteric bursitis of left hip 03/11/2019  . S/P hysterectomy 02/06/2019  . S/P TAH-BSO (total abdominal hysterectomy and bilateral salpingo-oophorectomy) 02/06/2019  . Myofascial pain dysfunction syndrome 09/27/2018  . Chronic bilateral back pain 09/03/2018  . Colitis 08/12/2015  . Oral thrush 11/18/2014  . B12 deficiency 11/17/2014  . Chronic pain syndrome 11/17/2014  . Numbness 11/17/2014  . Intractable headache 11/17/2014  . Intractable chronic migraine  without aura and without status migrainosus 11/17/2014  . Chronic migraine 10/11/2014  . Primary fibromyalgia syndrome 10/11/2014  . Lower extremity weakness 10/11/2014  . Vitamin B12 deficiency 10/11/2014  . Anxiety state 10/11/2014  . Nausea & vomiting 10/08/2014  . Headache, tension type, chronic 09/07/2012  . Abdominal pain 06/15/2012    Past Surgical History:  Procedure Laterality Date  . HYSTERECTOMY ABDOMINAL WITH SALPINGO-OOPHORECTOMY Bilateral 02/06/2019   Procedure: HYSTERECTOMY ABDOMINAL WITH BILATERAL SALPINGO-OOPHORECTOMY;  Surgeon: Lazaro Arms, MD;  Location: [facility];  Service: Gynecology;  Laterality: Bilateral;  . kidney infections    . TUBAL LIGATION    . WISDOM TOOTH EXTRACTION       OB History    Gravida  4   Para  4   Term  1   Preterm  3   AB      Living  4     SAB      TAB      Ectopic      Multiple      Live Births  4           Family History  Problem Relation Age of Onset  . Hypertension Mother   . Diabetes Mother   . Heart failure Father   . Colon cancer Paternal Grandfather     Social History   Tobacco Use  . Smoking status: Current Some Day Smoker    Packs/day: 0.14    Years: 5.00    Pack years: 0.70    Types: Cigarettes  . Smokeless tobacco: Never  Used  . Tobacco comment: 3 cig daily  Vaping Use  . Vaping Use: Some days  . Substances: CBD  Substance Use Topics  . Alcohol use: Not Currently  . Drug use: Yes    Types: Marijuana    Comment: last used september 2020    Home Medications Prior to Admission medications   Medication Sig Start Date End Date Taking? Authorizing Provider  amitriptyline (ELAVIL) 100 MG tablet Take 100 mg by mouth at bedtime.  04/27/19   [provider]  busPIRone (BUSPAR) 5 MG tablet Take 1 tablet (5 mg total) by mouth 3 (three) times daily. For anxiety 11/01/19   Lovorn, Aundra Millet, MD  chlorhexidine (PERIDEX) 0.12 % solution SMARTSIG:15 By Mouth Twice Daily 08/22/19   [provider]  cyclobenzaprine (FLEXERIL) 10 MG tablet TAKE ONE TABLET BY MOUTH THREE TIMES DAILY AS NEEDED FOR MUSCLE SPASMS. 09/30/19   Lovorn, Aundra Millet, MD  DULoxetine (CYMBALTA) 60 MG capsule Take 1 capsule (60 mg total) by mouth daily. 06/19/19   Lovorn, Aundra Millet, MD  EMGALITY 120 MG/ML SOAJ Inject into the skin. 10/31/19   [provider]  estradiol (ESTRACE) 2 MG tablet Take 1 tablet (2 mg total) by mouth daily. 02/07/19   Lazaro Arms, MD  eszopiclone (LUNESTA) 2 MG TABS tablet Take 1 tablet (2 mg total) by mouth at bedtime. Take immediately before bedtime 07/17/19   Lovorn, Aundra Millet, MD  folic acid (FOLVITE) 1 MG tablet Take 1 tablet (1 mg total) by mouth daily. 10/17/14   Jerald Kief, MD  HYDROcodone-acetaminophen (NORCO/VICODIN) 5-325 MG tablet TAKE ONE TABLET BY MOUTH THREE TIMES DAILY AS NEEDED FOR PAIN 11/29/19   Lovorn, Aundra Millet, MD  hydrOXYzine (ATARAX/VISTARIL) 25 MG tablet Take 1 tablet (25 mg total) by mouth every 8 (eight) hours as needed. 06/19/19   Lovorn, Aundra Millet, MD  lidocaine (LIDODERM) 5 % Place 3 patches onto the skin daily. Remove & Discard patch within 12 hours or as directed by MD 05/13/19   Genice Rouge, MD  meloxicam (MOBIC) 15 MG tablet Take 15 mg by mouth daily.    [provider]  nystatin (MYCOSTATIN) 100000 UNIT/ML suspension TAKE ONE TEASPOONFUL ( ) BY MOUTH FOUR TIMES DAILY 10/10/19   Lovorn, Aundra Millet, MD  penicillin v potassium (VEETID) 500 MG tablet Take 500 mg by mouth 4 (four) times daily.  08/22/19   [provider]  Polyethylene Glycol 3350 (MIRALAX PO) Take 17 g by mouth daily.     [provider]  prazosin (MINIPRESS) 5 MG capsule Take 5 mg by mouth at bedtime. 04/23/19   [provider]  pregabalin (LYRICA) 150 MG capsule Take 1 capsule (150 mg total) by mouth 2 (two) times daily. 07/17/19 07/16/20  Lovorn, Aundra Millet, MD  promethazine (PHENERGAN) 12.5 MG tablet Take 1 tablet (12.5 mg total) by mouth every 6 (six) hours as needed for  nausea or vomiting. 06/19/19   Lovorn, Aundra Millet, MD  promethazine (PHENERGAN) 6.25 MG/5ML syrup Take 10 mLs (12.5 mg total) by mouth 4 (four) times daily as needed for nausea or vomiting. 01/23/19 01/23/20  Lovorn, Aundra Millet, MD  QUEtiapine (SEROQUEL) 50 MG tablet Take 50 mg by mouth at bedtime.  04/22/19   [provider]  sertraline (ZOLOFT) 50 MG tablet Take by mouth.  04/22/19   [provider]  SUMAtriptan (IMITREX) 100 MG tablet Take 1 tablet (100 mg total) by mouth every 2 (two) hours as needed for migraine. May repeat in 2 hours if headache persists or recurs.  04/08/19   Lovorn, Aundra Millet, MD  valACYclovir (VALTREX) 1000 MG tablet Take 1 tablet (1,000 mg total) by mouth 3 (three) times daily. 03/31/19   Eustace Moore, MD  zolpidem (AMBIEN) 5 MG tablet Take 1 tablet (5 mg total) by mouth at bedtime as needed for sleep. 09/30/19   Lovorn, Aundra Millet, MD    Allergies    Metoclopramide, Tomato, Aspirin, Doxycycline, Hydromorphone hcl, Ibuprofen, Ondansetron, Other, Shellfish allergy, Toradol [ketorolac tromethamine], Latex, and Tramadol  Review of Systems   Review of Systems  Constitutional: Negative for chills, diaphoresis, fatigue and fever.  HENT: Negative for congestion.   Respiratory: Negative for cough, chest tightness, shortness of breath and wheezing.   Cardiovascular: Negative for chest pain.  Gastrointestinal: Negative for abdominal pain, constipation, diarrhea, nausea and vomiting.  Genitourinary: Negative for flank pain.  Musculoskeletal: Positive for back pain.  Neurological: Negative for light-headedness, numbness and headaches.  Psychiatric/Behavioral: Negative for agitation.  All other systems reviewed and are negative.   Physical Exam Updated Vital Signs BP (!) 139/103   Pulse (!) 106   Temp 97.8 F (36.6 C) (Oral)   Resp 18   LMP 12/04/2018   SpO2 97%   Physical Exam Vitals and nursing note reviewed.  Constitutional:      General: She is not in acute distress.     Appearance: She is well-developed. She is not ill-appearing, toxic-appearing or diaphoretic.  HENT:     Head: Normocephalic and atraumatic.     Right Ear: External ear normal.     Left Ear: External ear normal.     Nose: Nose normal. No congestion or rhinorrhea.     Mouth/Throat:     Mouth: Mucous membranes are dry.     Pharynx: No oropharyngeal exudate or posterior oropharyngeal erythema.  Eyes:     Extraocular Movements: Extraocular movements intact.     Conjunctiva/sclera: Conjunctivae normal.     Pupils: Pupils are equal, round, and reactive to light.  Cardiovascular:     Rate and Rhythm: Normal rate.     Heart sounds: No murmur heard.   Pulmonary:     Effort: No respiratory distress.     Breath sounds: No stridor. No wheezing, rhonchi or rales.  Chest:     Chest wall: No tenderness.  Abdominal:     General: Abdomen is flat. There is no distension.     Tenderness: There is no abdominal tenderness. There is no right CVA tenderness, left CVA tenderness, guarding or rebound.  Musculoskeletal:        General: Tenderness present.     Cervical back: Normal range of motion and neck supple. No tenderness.     Thoracic back: Tenderness present.     Lumbar back: Tenderness present.       Back:     Right lower leg: No edema.     Left lower leg: No edema.     Comments: Tenderness in her back and her legs consistent with prior episode she reports.  No other focal deficits.  Skin:    General: Skin is warm.     Findings: No erythema or rash.  Neurological:     General: No focal deficit present.     Mental Status: She is alert and oriented to person, place, and time.     Sensory: No sensory deficit.     Motor: No weakness or abnormal muscle tone.     Deep Tendon Reflexes: Reflexes are normal and symmetric.  Psychiatric:  Mood and Affect: Mood normal.     ED Results / Procedures / Treatments   Labs (all labs ordered are listed, but only abnormal results are  displayed) Labs Reviewed  RETICULOCYTES  CBC WITH DIFFERENTIAL/PLATELET  COMPREHENSIVE METABOLIC PANEL  URINALYSIS, ROUTINE W REFLEX MICROSCOPIC  I-STAT BETA HCG BLOOD, ED ([facility], [facility], [facility] ONLY)    EKG None  Radiology No results found.  Procedures Procedures (including critical care time)  Medications Ordered in ED Medications  dextrose 5 %-0.45 % sodium chloride infusion (has no administration in time range)  diphenhydrAMINE (BENADRYL) injection 25 mg (has no administration in time range)  promethazine (PHENERGAN) injection 25 mg (has no administration in time range)  HYDROmorphone (DILAUDID) injection 2 mg (has no administration in time range)  HYDROmorphone (DILAUDID) injection 2 mg (has no administration in time range)    ED Course  I have reviewed the triage vital signs and the nursing notes.  Pertinent labs & imaging results that were available during my care of the patient were reviewed by me and considered in my medical decision making (see chart for details).    MDM Rules/Calculators/A&P                          Tanya Krause is a 34 y.o. female with a past medical history significant for sickle cell disease who presents with sickle cell pain in her legs and back.  Patient reports that for the last few days she has had sickle cell pain in her legs and back consistent with prior episodes.  She says that she is try to take home medicines without significant relief.  She reports she was go to go to see her outpatient sickle cell team today but there was a problem with her transportation and she was unable to go to the appointment.  She came in for help due to the severe pain.  She reports this is an episode that she thinks will turn around to the emergency department and not have to be admitted.  She reports no fevers, chills, chest pain, shortness breath, cough, or urinary changes.  She does not suspect this is acute chest or pneumonia.  She denies any Covid exposures.  Denies  any urinary symptoms.  She denies other complaints in the emergency department.  On exam, lungs clear and chest nontender.  Abdomen nontender.  Back is tender paraspinally where she normally hurts.  She also reports pain in her legs with no edema or swelling seen.  She reports no recent travel and does not feel like she has a DVT or other problem with her legs.  We will get screening labs and give her sickle cell medications and fluids.  She thinks that she will go home after medication doses.  Anticipate follow-up on results after blood is collected and reassessment after medications.  If she is doing better, dissipate discharge for outpatient follow-up.  If she does not improve or has significant abnormalities needing intervention, anticipate discussion with medicine team.  Care transferred to oncoming team while awaiting reassessment and work-up.   Final Clinical Impression(s) / ED Diagnoses Final diagnoses:  Sickle cell pain crisis (HCC)    Clinical Impression: 1. Sickle cell pain crisis [facility])     Disposition: Care transferred to oncoming team while awaiting reassessment and work-up.  This note was prepared with assistance of Conservation officer, historic buildings. Occasional wrong-word or sound-a-like substitutions may have occurred due to the inherent limitations of  voice recognition software.      Terrius Gentile, Canary Brim, MD 12/19/19 [phone]

## 2019-12-19 NOTE — ED Provider Notes (Signed)
  Provider Note MRN:  [id number]  Arrival date & time: 12/19/19    ED Course and Medical Decision Making  Assumed care from Dr. Rush Landmark at shift change.  Sickle cell pain crisis providing pain medicine, awaiting labs.  Anticipating discharge if we can get her pain better controlled.  Patient feeling better and will follow up at her infusion center.  Procedures  Final Clinical Impressions(s) / ED Diagnoses     ICD-10-CM   1. Sickle cell pain crisis [facility])  D57.00     ED Discharge Orders    None      Discharge Instructions   None     Elmer Sow. Pilar Plate, MD [facility] Emergency Medicine [facility] [email or web address]    Sabas Sous, MD 12/19/19 [phone]

## 2019-12-19 NOTE — Telephone Encounter (Signed)
Patient called, requesting to come to the day hospital due to generalized pain rated at 10/10. Denied chest pain, fever, diarrhea and abdominal pain. Endorsed nausea and vomiting. Last vomited at 05:00 am today. Screened negative for Covid-19 symptoms. Admitted to having means of transportation without driving self after treatment. Last took 1 tablet of 5-325 mg Hydrocodone, Flexeril 10 mg and PO Phenergan 25 mg at 03:00 am today. Per provider, patient can come to the day hospital for treatment. Patient notified, verbalized understanding.

## 2019-12-20 ENCOUNTER — Other Ambulatory Visit: Payer: Self-pay

## 2019-12-20 ENCOUNTER — Encounter: Payer: Self-pay | Admitting: Physical Medicine and Rehabilitation

## 2019-12-20 ENCOUNTER — Encounter
Payer: Medicaid Other | Attending: Physical Medicine and Rehabilitation | Admitting: Physical Medicine and Rehabilitation

## 2019-12-20 VITALS — BP 127/87 | HR 101 | Temp 98.3°F | Ht 60.0 in | Wt 180.0 lb

## 2019-12-20 DIAGNOSIS — G894 Chronic pain syndrome: Secondary | ICD-10-CM | POA: Diagnosis not present

## 2019-12-20 DIAGNOSIS — M7918 Myalgia, other site: Secondary | ICD-10-CM | POA: Insufficient documentation

## 2019-12-20 DIAGNOSIS — R112 Nausea with vomiting, unspecified: Secondary | ICD-10-CM | POA: Diagnosis present

## 2019-12-20 DIAGNOSIS — M797 Fibromyalgia: Secondary | ICD-10-CM | POA: Insufficient documentation

## 2019-12-20 DIAGNOSIS — R519 Headache, unspecified: Secondary | ICD-10-CM | POA: Insufficient documentation

## 2019-12-20 NOTE — Patient Instructions (Signed)
Plan: 1. Patient here for trigger point injections for  Consent done and on chart.  Cleaned areas with alcohol and injected using a 27 gauge 1.5 inch needle  Injected 6cc Using 1% Lidocaine with no EPI  Upper traps B/L Levators  B/L Posterior scalenes B/L-  Middle scalenes B/L Splenius Capitus B/L Pectoralis Major B/L Rhomboids B/L Infraspinatus Teres Major/minor Thoracic paraspinals Lumbar paraspinals B/L Other injections-    Patient's level of pain prior was 10/10 Current level of pain after injections is 9/10- after injections  There was no bleeding or complications.  Patient was advised to drink a lot of water on day after injections to flush system Will have increased soreness for 12-48 hours after injections.  Can use Lidocaine patches the day AFTER injections Can use theracane on day of injections in places didn't inject Can use heating pad 4-6 hours AFTER injections  2. Not due for refills yet today- due ~12/15.   3. F/U in 4 weeks Trigger point injections
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
## 2019-12-31 ENCOUNTER — Other Ambulatory Visit: Payer: Self-pay | Admitting: Physical Medicine and Rehabilitation

## 2020-01-20 ENCOUNTER — Other Ambulatory Visit: Payer: Self-pay

## 2020-01-20 ENCOUNTER — Emergency Department (HOSPITAL_COMMUNITY): Payer: Medicaid Other

## 2020-01-20 ENCOUNTER — Emergency Department (HOSPITAL_COMMUNITY)
Admission: EM | Admit: 2020-01-20 | Discharge: 2020-01-21 | Disposition: A | Discharge status: Home / Self Care | Payer: Medicaid Other | Attending: Emergency Medicine | Admitting: Emergency Medicine

## 2020-01-20 DIAGNOSIS — I10 Essential (primary) hypertension: Secondary | ICD-10-CM | POA: Diagnosis not present

## 2020-01-20 DIAGNOSIS — R0789 Other chest pain: Secondary | ICD-10-CM | POA: Diagnosis not present

## 2020-01-20 DIAGNOSIS — Z7951 Long term (current) use of inhaled steroids: Secondary | ICD-10-CM | POA: Insufficient documentation

## 2020-01-20 DIAGNOSIS — R1110 Vomiting, unspecified: Secondary | ICD-10-CM | POA: Insufficient documentation

## 2020-01-20 DIAGNOSIS — F17210 Nicotine dependence, cigarettes, uncomplicated: Secondary | ICD-10-CM | POA: Insufficient documentation

## 2020-01-20 DIAGNOSIS — D5700 Hb-SS disease with crisis, unspecified: Secondary | ICD-10-CM | POA: Insufficient documentation

## 2020-01-20 DIAGNOSIS — R10817 Generalized abdominal tenderness: Secondary | ICD-10-CM | POA: Diagnosis not present

## 2020-01-20 DIAGNOSIS — Z20822 Contact with and (suspected) exposure to covid-19: Secondary | ICD-10-CM | POA: Diagnosis not present

## 2020-01-20 DIAGNOSIS — Z91040 Latex allergy status: Secondary | ICD-10-CM | POA: Diagnosis not present

## 2020-01-20 DIAGNOSIS — J45909 Unspecified asthma, uncomplicated: Secondary | ICD-10-CM | POA: Diagnosis not present

## 2020-01-20 DIAGNOSIS — R509 Fever, unspecified: Secondary | ICD-10-CM | POA: Diagnosis not present

## 2020-01-20 LAB — URINALYSIS, ROUTINE W REFLEX MICROSCOPIC
Bilirubin Urine: NEGATIVE
Glucose, UA: NEGATIVE mg/dL
Hgb urine dipstick: NEGATIVE
Ketones, ur: NEGATIVE mg/dL
Leukocytes,Ua: NEGATIVE
Nitrite: NEGATIVE
Protein, ur: NEGATIVE mg/dL
Specific Gravity, Urine: 1.013 (ref 1.005–1.030)
pH: 6 (ref 5.0–8.0)

## 2020-01-20 LAB — CBC WITH DIFFERENTIAL/PLATELET
Abs Immature Granulocytes: 0.01 10*3/uL (ref 0.00–0.07)
Basophils Absolute: 0.1 10*3/uL (ref 0.0–0.1)
Basophils Relative: 1 %
Eosinophils Absolute: 0.2 10*3/uL (ref 0.0–0.5)
Eosinophils Relative: 2 %
HCT: 44.6 % (ref 36.0–46.0)
Hemoglobin: 14.2 g/dL (ref 12.0–15.0)
Immature Granulocytes: 0 %
Lymphocytes Relative: 44 %
Lymphs Abs: 3.9 10*3/uL (ref 0.7–4.0)
MCH: 28.3 pg (ref 26.0–34.0)
MCHC: 31.8 g/dL (ref 30.0–36.0)
MCV: 88.8 fL (ref 80.0–100.0)
Monocytes Absolute: 0.4 10*3/uL (ref 0.1–1.0)
Monocytes Relative: 5 %
Neutro Abs: 4.1 10*3/uL (ref 1.7–7.7)
Neutrophils Relative %: 48 %
Platelets: 259 10*3/uL (ref 150–400)
RBC: 5.02 MIL/uL (ref 3.87–5.11)
RDW: 14 % (ref 11.5–15.5)
WBC: 8.7 10*3/uL (ref 4.0–10.5)
nRBC: 0 % (ref 0.0–0.2)

## 2020-01-20 LAB — POC URINE PREG, ED: Preg Test, Ur: NEGATIVE

## 2020-01-20 LAB — COMPREHENSIVE METABOLIC PANEL
ALT: 14 U/L (ref 0–44)
AST: 18 U/L (ref 15–41)
Albumin: 4.3 g/dL (ref 3.5–5.0)
Alkaline Phosphatase: 56 U/L (ref 38–126)
Anion gap: 11 (ref 5–15)
BUN: 9 mg/dL (ref 6–20)
CO2: 24 mmol/L (ref 22–32)
Calcium: 9.6 mg/dL (ref 8.9–10.3)
Chloride: 104 mmol/L (ref 98–111)
Creatinine, Ser: 0.87 mg/dL (ref 0.44–1.00)
GFR, Estimated: >60 mL/min (ref >60)
Glucose, Bld: 83 mg/dL (ref 70–99)
Potassium: 3.9 mmol/L (ref 3.5–5.1)
Sodium: 139 mmol/L (ref 135–145)
Total Bilirubin: 0.4 mg/dL (ref 0.3–1.2)
Total Protein: 8 g/dL (ref 6.5–8.1)

## 2020-01-20 LAB — RESP PANEL BY RT-PCR (FLU A&B, COVID) ARPGX2
Influenza A by PCR: NEGATIVE
Influenza B by PCR: NEGATIVE
SARS Coronavirus 2 by RT PCR: NEGATIVE

## 2020-01-20 LAB — LIPASE, BLOOD: Lipase: 24 U/L (ref 11–51)

## 2020-01-20 MED ORDER — SODIUM CHLORIDE 0.9 % IV BOLUS
1000.0000 mL | Freq: Once | INTRAVENOUS | Status: AC
  Administered 2020-01-20: 1000 mL via INTRAVENOUS

## 2020-01-20 MED ORDER — HYDROMORPHONE HCL 1 MG/ML IJ SOLN
1.0000 mg | INTRAMUSCULAR | Status: AC
  Administered 2020-01-20: 1 mg via INTRAVENOUS
  Filled 2020-01-20: qty 1

## 2020-01-20 MED ORDER — DIPHENHYDRAMINE HCL 25 MG PO CAPS
25.0000 mg | ORAL_CAPSULE | ORAL | Status: DC | PRN
  Administered 2020-01-20: 25 mg via ORAL
  Filled 2020-01-20: qty 1

## 2020-01-20 MED ORDER — HYDROMORPHONE HCL 2 MG/ML IJ SOLN
2.0000 mg | Freq: Once | INTRAMUSCULAR | Status: AC
Start: 2020-01-20 — End: 2020-01-20
  Administered 2020-01-20: 2 mg via INTRAVENOUS
  Filled 2020-01-20: qty 1

## 2020-01-20 MED ORDER — PROMETHAZINE HCL 12.5 MG PO TABS
25.0000 mg | ORAL_TABLET | ORAL | Status: DC | PRN
  Administered 2020-01-20 – 2020-01-21 (×2): 25 mg via ORAL
  Filled 2020-01-20 (×2): qty 2

## 2020-01-20 NOTE — ED Provider Notes (Signed)
[facility] EMERGENCY DEPARTMENT Provider Note   CSN: [id number] Arrival date & time: 01/20/20  2005     History Chief Complaint  Patient presents with  . Sickle Cell Pain Crisis    Tanya Krause is a 35 y.o. female.  HPI 35 year old female presents with sickle cell pain crisis.  Has been ongoing for since 1/1.  Went to [facility] and was discharged after work-up.  Patient tells me she has been having vomiting which always occurs whenever her pain is out of control.  She also reports fever, up to 102 today for which she took Tylenol.  She tells me she threw it up though she also states it helped her fever.  No diarrhea.  Some cough and nasal congestion.  No dyspnea or headache. Diffuse abdominal pain.  Past Medical History:  Diagnosis Date  . Asthma   . Chronic abdominal pain   . Chronic migraine   . Chronic pain syndrome   . Fibromyalgia   . 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 Hypertension   . Migraine   . Pelvic inflammatory disease (PID)     Patient Active Problem List   Diagnosis Date Noted  . Insomnia due to medical condition 07/17/2019  . Encounter for long-term use of opiate analgesic 04/08/2019  . Encounter for medication monitoring 04/08/2019  . 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 Trochanteric bursitis of left hip 03/11/2019  . S/P hysterectomy 02/06/2019  . S/P TAH-BSO (total abdominal hysterectomy and bilateral salpingo-oophorectomy) 02/06/2019  . Myofascial pain dysfunction syndrome 09/27/2018  . Chronic bilateral back pain 09/03/2018  . Colitis 08/12/2015  . Oral thrush 11/18/2014  . B12 deficiency 11/17/2014  . Chronic pain syndrome 11/17/2014  . Numbness 11/17/2014  . Intractable headache 11/17/2014  . Intractable chronic migraine without aura and without status migrainosus 11/17/2014  . Chronic migraine 10/11/2014  . Primary fibromyalgia syndrome 10/11/2014  . Lower  extremity weakness 10/11/2014  . Vitamin B12 deficiency 10/11/2014  . Anxiety state 10/11/2014  . Nausea & vomiting 10/08/2014  . Headache, tension type, chronic 09/07/2012  . Abdominal pain 06/15/2012    Past Surgical History:  Procedure Laterality Date  . HYSTERECTOMY ABDOMINAL WITH SALPINGO-OOPHORECTOMY Bilateral 02/06/2019   Procedure: HYSTERECTOMY ABDOMINAL WITH BILATERAL SALPINGO-OOPHORECTOMY;  Surgeon: Florian Buff, MD;  Location: [facility];  Service: Gynecology;  Laterality: Bilateral;  . kidney infections    . TUBAL LIGATION    . WISDOM TOOTH EXTRACTION       OB History    Gravida  4   Para  4   Term  1   Preterm  3   AB      Living  4     SAB      IAB      Ectopic      Multiple      Live Births  4           Family History  Problem Relation Age of Onset  . Hypertension Mother   . Diabetes Mother   . Heart failure Father   . Colon cancer Paternal Grandfather     Social History   Tobacco Use  . Smoking status: Current Some Day Smoker    Packs/day: 0.14    Years: 5.00    Pack years: 0.70    Types: Cigarettes  . Smokeless tobacco: Never Used  . Tobacco comment: 3 cig daily  Vaping Use  . Vaping Use:  Some days  . Substances: CBD  Substance Use Topics  . Alcohol use: Not Currently  . Drug use: Yes    Types: Marijuana    Comment: last used september 2020    Home Medications Prior to Admission medications   Medication Sig Start Date End Date Taking? Authorizing Provider  acetaminophen (TYLENOL) 500 MG tablet Take 1,000 mg by mouth every 6 (six) hours as needed.   Yes [provider]  albuterol (ACCUNEB) 1.25 MG/3ML nebulizer solution Take 1 ampule by nebulization every 6 (six) hours as needed for wheezing or shortness of breath.  12/03/19  Yes [provider]  busPIRone (BUSPAR) 5 MG tablet Take 1 tablet (5 mg total) by mouth 3 (three) times daily. For anxiety 11/01/19  Yes Lovorn, Aundra Millet, MD  cyclobenzaprine (FLEXERIL)  10 MG tablet TAKE ONE TABLET BY MOUTH THREE TIMES DAILY AS NEEDED FOR MUSCLE SPASMS. Patient taking differently: Take 10 mg by mouth 3 (three) times daily as needed for muscle spasms. T 09/30/19  Yes Lovorn, Aundra Millet, MD  diphenhydrAMINE (BENADRYL) 25 MG tablet Take 25 mg by mouth every 6 (six) hours as needed for itching or allergies.   Yes [provider]  DULoxetine (CYMBALTA) 60 MG capsule Take 1 capsule (60 mg total) by mouth daily. 06/19/19  Yes Lovorn, Megan, MD  EMGALITY 120 MG/ML SOAJ Inject 1 each into the skin every 30 (thirty) days.  10/31/19  Yes [provider]  estradiol (ESTRACE) 2 MG tablet Take 1 tablet (2 mg total) by mouth daily. 02/07/19  Yes Lazaro Arms, MD  eszopiclone (LUNESTA) 2 MG TABS tablet Take 1 tablet (2 mg total) by mouth at bedtime. Take immediately before bedtime 07/17/19  Yes Lovorn, Megan, MD  FLOVENT HFA 220 MCG/ACT inhaler Inhale 2 puffs into the lungs 2 (two) times daily. 12/03/19  Yes [provider]  folic acid (FOLVITE) 1 MG tablet Take 1 tablet (1 mg total) by mouth daily. 10/17/14  Yes Jerald Kief, MD  HYDROcodone-acetaminophen (NORCO/VICODIN) 5-325 MG tablet TAKE ONE TABLET BY MOUTH THREE TIMES DAILY AS NEEDED FOR PAIN 01/01/20  Yes Lovorn, Aundra Millet, MD  hydrOXYzine (ATARAX/VISTARIL) 25 MG tablet Take 1 tablet (25 mg total) by mouth every 8 (eight) hours as needed. 06/19/19  Yes Lovorn, Megan, MD  lidocaine (LIDODERM) 5 % Place 3 patches onto the skin daily. Remove & Discard patch within 12 hours or as directed by MD Patient taking differently: Place 1 patch onto the skin daily. 05/13/19  Yes Lovorn, Aundra Millet, MD  Polyethylene Glycol 3350 (MIRALAX PO) Take 17 g by mouth daily as needed (constipation).    Yes [provider]  prazosin (MINIPRESS) 5 MG capsule Take 5 mg by mouth at bedtime. 04/23/19  Yes [provider]  pregabalin (LYRICA) 150 MG capsule Take 1 capsule (150 mg total) by mouth 2 (two) times daily. 07/17/19  07/16/20 Yes Lovorn, Aundra Millet, MD  promethazine (PHENERGAN) 12.5 MG tablet Take 1 tablet (12.5 mg total) by mouth every 6 (six) hours as needed for nausea or vomiting. 06/19/19  Yes Lovorn, Aundra Millet, MD  promethazine (PHENERGAN) 25 MG tablet Take 25 mg by mouth every 8 (eight) hours as needed for nausea or vomiting.  11/27/19  Yes [provider]  SUMAtriptan (IMITREX) 100 MG tablet Take 1 tablet (100 mg total) by mouth every 2 (two) hours as needed for migraine. May repeat in 2 hours if headache persists or recurs. 04/08/19  Yes Lovorn, Aundra Millet, MD  valACYclovir (VALTREX) 1000 MG tablet  Take 1 tablet (1,000 mg total) by mouth 3 (three) times daily. 03/31/19  Yes Eustace Moore, MD  zolpidem (AMBIEN) 5 MG tablet Take 1 tablet (5 mg total) by mouth at bedtime as needed for sleep. 09/30/19  Yes Lovorn, Aundra Millet, MD  nystatin (MYCOSTATIN) 100000 UNIT/ML suspension TAKE ONE TEASPOONFUL ( ) BY MOUTH FOUR TIMES DAILY Patient not taking: No sig reported 10/10/19   Lovorn, Aundra Millet, MD  promethazine (PHENERGAN) 6.25 MG/5ML syrup Take 10 mLs (12.5 mg total) by mouth 4 (four) times daily as needed for nausea or vomiting. Patient not taking: Reported on 01/20/2020 01/23/19 01/23/20  Genice Rouge, MD    Allergies    Metoclopramide, Tomato, Aspirin, Doxycycline, Hydromorphone hcl, Ibuprofen, Ondansetron, Other, Shellfish allergy, Toradol [ketorolac tromethamine], Latex, and Tramadol  Review of Systems   Review of Systems  Constitutional: Positive for fever.  HENT: Positive for congestion.   Respiratory: Positive for cough. Negative for shortness of breath.   Cardiovascular: Negative for chest pain.  Gastrointestinal: Positive for abdominal pain, nausea and vomiting.  Musculoskeletal: Positive for myalgias (legs).  All other systems reviewed and are negative.   Physical Exam Updated Vital Signs BP (!) 147/113 (BP Location: Left Arm)   Pulse 90   Temp 98.4 F (36.9 C) (Oral)   Resp 18   Ht 5' (1.524 m)   Wt  82.6 kg   LMP 12/04/2018   SpO2 96%   BMI 35.54 kg/m   Physical Exam Vitals and nursing note reviewed.  Constitutional:      General: She is not in acute distress.    Appearance: She is well-developed and well-nourished. She is not ill-appearing or diaphoretic.  HENT:     Head: Normocephalic and atraumatic.     Right Ear: External ear normal.     Left Ear: External ear normal.     Nose: Nose normal.  Eyes:     General:        Right eye: No discharge.        Left eye: No discharge.  Cardiovascular:     Rate and Rhythm: Normal rate and regular rhythm.     Heart sounds: Normal heart sounds.  Pulmonary:     Effort: Pulmonary effort is normal.     Breath sounds: Normal breath sounds.  Chest:     Chest wall: Tenderness present.  Abdominal:     Palpations: Abdomen is soft.     Tenderness: There is generalized abdominal tenderness.  Musculoskeletal:     Cervical back: Normal range of motion and neck supple. No rigidity.  Skin:    General: Skin is warm and dry.  Neurological:     Mental Status: She is alert.  Psychiatric:        Mood and Affect: Mood is not anxious.     ED Results / Procedures / Treatments   Labs (all labs ordered are listed, but only abnormal results are displayed) Labs Reviewed  URINALYSIS, ROUTINE W REFLEX MICROSCOPIC - Abnormal; Notable for the following components:      Result Value   Bacteria, UA RARE (*)    All other components within normal limits  RESP PANEL BY RT-PCR (FLU A&B, COVID) [id number]  URINE CULTURE  COMPREHENSIVE METABOLIC PANEL  LIPASE, BLOOD  CBC WITH DIFFERENTIAL/PLATELET  RETICULOCYTES  POC URINE PREG, ED    EKG None  Radiology DG Chest Port 1 View  Result Date: 01/20/2020 CLINICAL DATA:  34 year old female with cough and fever. EXAM: PORTABLE CHEST 1 VIEW COMPARISON:  Chest radiograph dated 01/19/2020. FINDINGS: The heart size and mediastinal contours are within normal limits. Both lungs are clear. The visualized skeletal  structures are unremarkable. IMPRESSION: No active disease. Electronically Signed   By: Elgie Collard M.D.   On: 01/20/2020 21:51    Procedures Procedures (including critical care time)  Medications Ordered in ED Medications  promethazine (PHENERGAN) tablet 25 mg (25 mg Oral Given 01/20/20 2131)  diphenhydrAMINE (BENADRYL) capsule 25-50 mg (25 mg Oral Given 01/20/20 2147)  HYDROmorphone (DILAUDID) injection 2 mg (has no administration in time range)  HYDROmorphone (DILAUDID) injection 1 mg (1 mg Intravenous Given 01/20/20 2147)  HYDROmorphone (DILAUDID) injection 1 mg (1 mg Intravenous Given 01/20/20 2233)  sodium chloride 0.9 % bolus 1,000 mL ( Intravenous Rate/Dose Verify 01/20/20 2248)    ED Course  I have reviewed the triage vital signs and the nursing notes.  Pertinent labs & imaging results that were available during my care of the patient were reviewed by me and considered in my medical decision making (see chart for details).    MDM Rules/Calculators/A&P                          Patient presents with what sounds like a sickle cell pain crisis.  Her work-up is unremarkable including normal WBC, electrolytes, etc.  For some reason the lab did not get her reticulocytes but my suspicion is this is not going to be significantly abnormal to affect decision-making today.  She does have some generalized abdominal soreness/tenderness but this is also a chronic problem for her.  I doubt acute abdominal emergency.  Do not think CT imaging needed today.  Pain is getting somewhat better so we will give third dose of pain medicine.  Recommend close follow-up with her sickle cell providers at [facility] long.  While she reports fever at home, her Covid testing is negative and further work-up is unremarkable.  She does not have any lines to suggest occult bacteremia.  Blood cultures have been sent but I have low suspicion she has a bacterial illness at this time. Care to Dr. Lynelle Doctor Final Clinical Impression(s)  / ED Diagnoses Final diagnoses:  Sickle cell pain crisis [facility])    Rx / DC Orders ED Discharge Orders    None       Pricilla Loveless, MD 01/20/20 2356

## 2020-01-20 NOTE — ED Triage Notes (Signed)
Patient was seen on Saturday at [facility], but was told that there is nothing else they can do for her there. Pt was supposed to see doctor this week but missed appt so she called EMS.

## 2020-01-21 NOTE — ED Provider Notes (Signed)
Patient was left at change of shift to see if she is feeling better after her third dose of pain medication.  Patient is feeling better and ready to be discharged.   Devoria Albe, MD 01/21/20 [phone]

## 2020-01-21 NOTE — Discharge Instructions (Addendum)
Drink plenty of fluids.  Take your pain medication as prescribed.  Follow-up with your physician, Dr. Berline Chough, if you aren't improving.

## 2020-01-22 LAB — URINE CULTURE: Culture: <10000 — AB

## 2020-01-31 ENCOUNTER — Telehealth: Payer: Self-pay | Admitting: Registered Nurse

## 2020-01-31 ENCOUNTER — Other Ambulatory Visit: Payer: Self-pay | Admitting: Physical Medicine and Rehabilitation

## 2020-01-31 ENCOUNTER — Ambulatory Visit: Payer: Medicaid Other | Admitting: Physical Medicine and Rehabilitation

## 2020-01-31 MED ORDER — HYDROCODONE-ACETAMINOPHEN 5-325 MG PO TABS: 1.0000 | ORAL_TABLET | Freq: Three times a day (TID) | ORAL | 0 refills | Status: DC | PRN

## 2020-01-31 NOTE — Telephone Encounter (Signed)
PMP was Reviewed.  Hydrocodone e-scribed today.  

## 2020-02-17 ENCOUNTER — Other Ambulatory Visit: Payer: Self-pay

## 2020-02-17 ENCOUNTER — Encounter: Payer: Self-pay | Admitting: Physical Medicine and Rehabilitation

## 2020-02-17 ENCOUNTER — Encounter
Payer: Medicaid Other | Attending: Physical Medicine and Rehabilitation | Admitting: Physical Medicine and Rehabilitation

## 2020-02-17 VITALS — BP 136/91 | HR 117 | Temp 99.0°F | Ht 60.0 in | Wt 181.8 lb

## 2020-02-17 DIAGNOSIS — G43519 Persistent migraine aura without cerebral infarction, intractable, without status migrainosus: Secondary | ICD-10-CM | POA: Diagnosis not present

## 2020-02-17 DIAGNOSIS — M7918 Myalgia, other site: Secondary | ICD-10-CM | POA: Insufficient documentation

## 2020-02-17 DIAGNOSIS — M797 Fibromyalgia: Secondary | ICD-10-CM | POA: Diagnosis present

## 2020-02-17 DIAGNOSIS — G43019 Migraine without aura, intractable, without status migrainosus: Secondary | ICD-10-CM | POA: Diagnosis present
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
## 2020-02-18 ENCOUNTER — Other Ambulatory Visit: Payer: Self-pay | Admitting: Physical Medicine and Rehabilitation

## 2020-02-18 NOTE — Telephone Encounter (Signed)
Tanya Krause complains of nausea and vomiting since yesterday. After her trigger points the nausea started. She is requesting " liquid" Phenergan.

## 2020-02-28 ENCOUNTER — Telehealth: Payer: Self-pay

## 2020-02-28 NOTE — Telephone Encounter (Signed)
Call back ph [phone]  [facility] called:  Patient stated she has back pain. She can not move her legs, both legs are numb and have been like this since Wednesday. The pain level is at 10 even with all her medications & heat compression.   Patient has been advised to call EMS for evaluation. If she does not have anyone to physically take her to the ED.  Please advise.    Okay per Dr. Berline Chough.

## 2020-03-03 ENCOUNTER — Other Ambulatory Visit: Payer: Self-pay | Admitting: Registered Nurse

## 2020-03-03 NOTE — Telephone Encounter (Signed)
Sent in Rx for Norco 5/325 mg TID prn- #90

## 2020-03-03 NOTE — Telephone Encounter (Signed)
Refill request for Hydrocodoen/APAP

## 2020-03-06 ENCOUNTER — Other Ambulatory Visit: Payer: Self-pay | Admitting: Physical Medicine and Rehabilitation

## 2020-03-18 ENCOUNTER — Encounter (HOSPITAL_COMMUNITY): Payer: Self-pay | Admitting: Physical Therapy

## 2020-03-18 ENCOUNTER — Other Ambulatory Visit: Payer: Self-pay

## 2020-03-18 ENCOUNTER — Ambulatory Visit (HOSPITAL_COMMUNITY): Payer: Medicaid Other | Attending: Family Medicine | Admitting: Physical Therapy

## 2020-03-18 DIAGNOSIS — M6281 Muscle weakness (generalized): Secondary | ICD-10-CM | POA: Insufficient documentation

## 2020-03-18 DIAGNOSIS — R52 Pain, unspecified: Secondary | ICD-10-CM | POA: Diagnosis present

## 2020-03-18 DIAGNOSIS — R262 Difficulty in walking, not elsewhere classified: Secondary | ICD-10-CM | POA: Diagnosis present

## 2020-03-18 NOTE — Therapy (Signed)
[facility] [address] [place], [place], [address] Phone: [phone]   Fax:  [phone]  Physical Therapy Evaluation  Patient Details  Name: Tanya Krause MRN: [id number] Date of Birth: 05-17-1985 Referring Provider (PT): Verlon Au, MS   Encounter Date: 03/18/2020   PT End of Session - 03/18/20 0915    Visit Number 1    Number of Visits 12    Date for PT Re-Evaluation 05/13/20    Authorization Type medicaid UHC no auth for adults, sending order for OT -  may have to split visits    Authorization - Visit Number 0    Authorization - Number of Visits 27    Progress Note Due on Visit 10    PT Start Time 0920    PT Stop Time 1005    PT Time Calculation (min) 45 min    Equipment Utilized During Treatment Back brace    Activity Tolerance Patient limited by pain;Patient limited by lethargy;Patient limited by fatigue    Behavior During Therapy [facility] for tasks assessed/performed           Past Medical History:  Diagnosis Date  . Asthma   . Chronic abdominal pain   . Chronic migraine   . Chronic pain syndrome   . Fibromyalgia   . 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 Hypertension   . Migraine   . Pelvic inflammatory disease (PID)     Past Surgical History:  Procedure Laterality Date  . HYSTERECTOMY ABDOMINAL WITH SALPINGO-OOPHORECTOMY Bilateral 02/06/2019   Procedure: HYSTERECTOMY ABDOMINAL WITH BILATERAL SALPINGO-OOPHORECTOMY;  Surgeon: Lazaro Arms, MD;  Location: [facility];  Service: Gynecology;  Laterality: Bilateral;  . kidney infections    . TUBAL LIGATION    . WISDOM TOOTH EXTRACTION      There were no vitals filed for this visit.    Subjective Assessment - 03/18/20 0937    Subjective States her legs are weak, her spine and her neck can't move. States the more she moves the more she swells up. States it hurts to move and to bend down. States that her hands are stiff and she can't grip anything  heavy. States  she usually uses a rollator but it is so old that it doesn't help. States that she has scoliosis and myofascial pain. States that she has RA and neuropathy, fibromyalgia. States her pain has been going on for a long time and has progressively worsened. States that she hasn't been able to do therapy because of COVID. States that maybe aquatics may be help. States she wants to be able to move better. States that she has been wearing a lumbar brace all the time because she can't sit up. States that she went to a spine doctor Monday that they gave her the brace and said she needed PT. States that she know that everything is going to get worse but wants to work on her function. She has a nurse that helps her 7 days a week and has her mom and aunt help her. They help with bed mobilities, transfers, cooking, cleaning. States she can stand from 4-5 minutes at time. States she can walk about a minute by herself but feels more comfortable with her rollator.    Pertinent History scoliosis, fibromyalgia, RA, myofascial pain syndrome    How long can you stand comfortably? 4-5    How long can you walk comfortably? 1    Patient Stated Goals to be able  to function better    Currently in Pain? Yes    Pain Score 10-Worst pain ever    Pain Location Generalized    Pain Orientation Other (Comment)   everywhere   Pain Descriptors / Indicators Aching;Throbbing;Shooting;Sharp;Discomfort;Constant    Pain Type Chronic pain    Pain Radiating Towards all over    Pain Onset More than a month ago    Pain Frequency Constant    Aggravating Factors  movement    Pain Relieving Factors none              [facility] PT Assessment - 03/18/20 0001      Assessment   Medical Diagnosis chronic pain    Referring Provider (PT) Verlon Au, MS      Precautions   Precautions Fall      Balance Screen   Has the patient fallen in the past 6 months Yes    How many times? 1    Has the patient had a decrease in activity level  because of a fear of falling?  Yes    Is the patient reluctant to leave their home because of a fear of falling?  Yes      Home Environment   Living Environment Private residence    Living Arrangements Children    Available Help at Discharge Family;Friend(s);Personal care attendant    Type of Home Apartment    Home Access Stairs to enter    Entrance Stairs-Number of Steps 2    Home Layout One level    Home Equipment Bedside commode   rollater but it is broken     Prior Function   Level of Independence Needs assistance with ADLs;Needs assistance with homemaking;Needs assistance with transfers;Independent with household mobility with device      Cognition   Overall Cognitive Status Within Functional Limits for tasks assessed      Observation/Other Assessments   Focus on Therapeutic Outcomes (FOTO)  NA      ROM / Strength   AROM / PROM / Strength Strength      Strength   Strength Assessment Site Knee    Right/Left Knee Left;Right    Right Knee Extension 3/5   slow labored movements   Left Knee Extension 3/5   slow labored movements     Transfers   Comments 30 sec sit to stand - 1 rep wit left upper extremity assist.      Ambulation/Gait   Ambulation/Gait Yes    Ambulation/Gait Assistance 5: Supervision    Ambulation Distance (Feet) 34 Feet    Assistive device Rolling walker    Gait Pattern Decreased step length - right;Decreased step length - left;Decreased stride length;Trunk flexed   heavy use of hands, slow labored movements   Ambulation Surface Level;Indoor    Gait velocity decreased    Gait Comments                      Objective measurements completed on examination: See above findings.       [facility] Adult PT Treatment/Exercise - 03/18/20 0001      Exercises   Exercises Knee/Hip      Knee/Hip Exercises: Seated   Long Arc Quad --   3 reps bilaterally - slow labored movements                 PT Education - 03/18/20 1011    Education  Details on current condition, on POC and plan moving forward.  Person(s) Educated Patient    Methods Explanation    Comprehension Verbalized understanding            PT Short Term Goals - 03/18/20 0951      PT SHORT TERM GOAL #1   Title Patient will be independent in self management strategies to improve quality of life and functional outcomes.    Time 4    Period Weeks    Status New    Target Date 04/15/20      PT SHORT TERM GOAL #2   Title Patient will report at least 25% improvement in overall symptoms and/or function to demonstrate improved functional mobility    Time 4    Period Weeks    Status New    Target Date 04/15/20      PT SHORT TERM GOAL #3   Title Patient will be able to walk at least 75 feet with RW in to demonstrate improved walking endurance    Time 4    Period Weeks    Status New    Target Date 04/15/20      PT SHORT TERM GOAL #4   Title Patient will be able to demonstrate at least 2 sit to stands in 30 seconds sit to stand test with use of one hand as needed    Time 4    Period Weeks    Status New    Target Date 04/15/20             PT Long Term Goals - 03/18/20 0951      PT LONG TERM GOAL #1   Title Patient will be able to demonstrate at least 4 sit to stands in 30 seconds sit to stand test with use of one hand as needed    Time 8    Period Weeks    Status New    Target Date 05/13/20      PT LONG TERM GOAL #2   Title Patient will be able to walk at least 125 feet with RW in to demonstrate improved walking endurance    Time 8    Period Weeks    Status New    Target Date 05/13/20      PT LONG TERM GOAL #3   Title Patient will report at least 50% improvement in overall symptoms and/or function to demonstrate improved functional mobility    Time 8    Period Weeks    Status New    Target Date 05/13/20                  Plan - 03/18/20 5852    Clinical Impression Statement Patient presents to therapy with complaints  of generalized pain that limits her overall ability to move and function. Pain has been present for years and has just progressively worsened. Reports at baseline she uses a rollator but it is 36 years old and broken and she needs a new one. Session limited secondary to slow labored movements and pain resulting in resting tremors in hands and head. Encouraged patient to follow up with MD about need for new rollator. Patient would also benefit from occupational therapy so referral sent to MD today. Patient highly motivated at improving her function as she currently relies on nurses and family to assist her with all tasks during the day. Patient would greatly benefit from skilled physical therapy to improve overall function and quality of life.    Personal Factors and Comorbidities Comorbidity 1;Comorbidity 2;Comorbidity 3+  Comorbidities fibromyalgia, RA, myofascial pain syndrome    Examination-Activity Limitations Bed Mobility;Lift;Locomotion Level;Transfers;Stand;Squat;Sit;Bathing;Bend;Carry    Examination-Participation Restrictions Meal Prep;Community Activity;Cleaning;Shop    Stability/Clinical Decision Making Evolving/Moderate complexity    Clinical Decision Making Moderate    Rehab Potential Fair    PT Frequency Other (comment)   1-2x/week for total of 12 visits over 8 week certification period   PT Duration 8 weeks    PT Treatment/Interventions ADLs/Self Care Home Management;Aquatic Therapy;Cryotherapy;Electrical Stimulation;Moist Heat;Traction;Balance training;Therapeutic exercise;Therapeutic activities;Functional mobility training;Stair training;Gait training;DME Instruction;Neuromuscular re-education;Patient/family education;Manual techniques;Passive range of motion    PT Next Visit Plan functional strength, walking, pain management strategies, aquatics    PT Home Exercise Plan LAQs    Recommended Other Services occupational therapy    Consulted and Agree with Plan of Care Patient            Patient will benefit from skilled therapeutic intervention in order to improve the following deficits and impairments:  Decreased endurance,Decreased activity tolerance,Decreased knowledge of use of DME,Decreased strength,Pain,Difficulty walking,Decreased mobility,Decreased balance,Decreased range of motion  Visit Diagnosis: Difficulty in walking, not elsewhere classified  Muscle weakness (generalized)  Generalized pain     Problem List Patient Active Problem List   Diagnosis Date Noted  . Insomnia due to medical condition 07/17/2019  . Encounter for long-term use of opiate analgesic 04/08/2019  . Encounter for medication monitoring 04/08/2019  . 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 Trochanteric bursitis of left hip 03/11/2019  . S/P hysterectomy 02/06/2019  . S/P TAH-BSO (total abdominal hysterectomy and bilateral salpingo-oophorectomy) 02/06/2019  . Myofascial pain dysfunction syndrome 09/27/2018  . Chronic bilateral back pain 09/03/2018  . Colitis 08/12/2015  . Oral thrush 11/18/2014  . B12 deficiency 11/17/2014  . Chronic pain syndrome 11/17/2014  . Numbness 11/17/2014  . Intractable headache 11/17/2014  . Intractable chronic migraine without aura and without status migrainosus 11/17/2014  . Migraines 10/11/2014  . Primary fibromyalgia syndrome 10/11/2014  . Lower extremity weakness 10/11/2014  . Vitamin B12 deficiency 10/11/2014  . Anxiety state 10/11/2014  . Nausea & vomiting 10/08/2014  . Headache, tension type, chronic 09/07/2012  . Abdominal pain 06/15/2012   10:26 AM, 03/18/20 Tereasa Coop, DPT Physical Therapy with [facility]  [phone] office  [facility] [address] [place], [place], [address] Phone: [phone]   Fax:  [phone]  Name: Tanya Krause MRN: [id number] Date of Birth: 12-26-1985

## 2020-03-19 ENCOUNTER — Ambulatory Visit (HOSPITAL_COMMUNITY): Payer: Medicaid Other | Admitting: Physical Therapy

## 2020-03-19 DIAGNOSIS — M6281 Muscle weakness (generalized): Secondary | ICD-10-CM

## 2020-03-19 DIAGNOSIS — R262 Difficulty in walking, not elsewhere classified: Secondary | ICD-10-CM | POA: Diagnosis not present

## 2020-03-19 DIAGNOSIS — R52 Pain, unspecified: Secondary | ICD-10-CM

## 2020-03-19 NOTE — Therapy (Signed)
[facility] [address] [place], [place], [address] Phone: [phone]   Fax:  [phone]  Physical Therapy Treatment  Patient Details  Name: Tanya Krause MRN: [id number] Date of Birth: [birth year] Referring Provider (PT): Verlon Au, MS   Encounter Date: 03/19/2020   PT End of Session - 03/19/20 0912    Visit Number 2    Number of Visits 12    Date for PT Re-Evaluation 05/13/20    Authorization Type medicaid UHC no auth for adults, sending order for OT -  may have to split visits    Authorization - Visit Number 1    Authorization - Number of Visits 27    Progress Note Due on Visit 10    PT Start Time 0915    PT Stop Time 0955    PT Time Calculation (min) 40 min    Equipment Utilized During Treatment Back brace    Activity Tolerance Patient limited by pain;Patient limited by lethargy;Patient limited by fatigue    Behavior During Therapy [facility] for tasks assessed/performed           Past Medical History:  Diagnosis Date  . Asthma   . Chronic abdominal pain   . Chronic migraine   . Chronic pain syndrome   . Fibromyalgia   . 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 Hypertension   . Migraine   . Pelvic inflammatory disease (PID)     Past Surgical History:  Procedure Laterality Date  . HYSTERECTOMY ABDOMINAL WITH SALPINGO-OOPHORECTOMY Bilateral 02/06/2019   Procedure: HYSTERECTOMY ABDOMINAL WITH BILATERAL SALPINGO-OOPHORECTOMY;  Surgeon: Lazaro Arms, MD;  Location: [facility];  Service: Gynecology;  Laterality: Bilateral;  . kidney infections    . TUBAL LIGATION    . WISDOM TOOTH EXTRACTION      There were no vitals filed for this visit.   Subjective Assessment - 03/19/20 0924    Subjective States she was sore after last session. States she has been trying to walk without shaking. Reports that she has her rollator today.    Pertinent History scoliosis, fibromyalgia, RA, myofascial pain syndrome    How long can you  stand comfortably? 4-5    How long can you walk comfortably? 1    Patient Stated Goals to be able to function better    Pain Score 9    all over   Pain Onset More than a month ago              [facility] PT Assessment - 03/19/20 0001      Assessment   Medical Diagnosis chronic pain    Referring Provider (PT) Verlon Au, MS                         [facility] Adult PT Treatment/Exercise - 03/19/20 0001      Ambulation/Gait   Ambulation/Gait Yes    Ambulation/Gait Assistance 5: Supervision    Ambulation Distance (Feet) 30 Feet    Assistive device --   rollater   Gait Pattern Decreased step length - right;Decreased step length - left;Decreased stride length;Trunk flexed    Ambulation Surface Level;Indoor    Gait velocity decreased    Gait Comments x5 reps      Knee/Hip Exercises: Aerobic   Nustep 5 minutes - assist to get feet on pedals- cues to push/pull throughout time on nustep, then 1 minute with constant encouragment to keep screen on (pedal faster)  Knee/Hip Exercises: Seated   Marching 3 sets;5 reps;Both;Strengthening                  PT Education - 03/19/20 0926    Education Details on rational for exercises and how they will help with current symptoms.    Person(s) Educated Patient    Methods Explanation    Comprehension Verbalized understanding            PT Short Term Goals - 03/18/20 0951      PT SHORT TERM GOAL #1   Title Patient will be independent in self management strategies to improve quality of life and functional outcomes.    Time 4    Period Weeks    Status New    Target Date 04/15/20      PT SHORT TERM GOAL #2   Title Patient will report at least 25% improvement in overall symptoms and/or function to demonstrate improved functional mobility    Time 4    Period Weeks    Status New    Target Date 04/15/20      PT SHORT TERM GOAL #3   Title Patient will be able to walk at least 75 feet with RW in to  demonstrate improved walking endurance    Time 4    Period Weeks    Status New    Target Date 04/15/20      PT SHORT TERM GOAL #4   Title Patient will be able to demonstrate at least 2 sit to stands in 30 seconds sit to stand test with use of one hand as needed    Time 4    Period Weeks    Status New    Target Date 04/15/20             PT Long Term Goals - 03/18/20 0951      PT LONG TERM GOAL #1   Title Patient will be able to demonstrate at least 4 sit to stands in 30 seconds sit to stand test with use of one hand as needed    Time 8    Period Weeks    Status New    Target Date 05/13/20      PT LONG TERM GOAL #2   Title Patient will be able to walk at least 125 feet with RW in to demonstrate improved walking endurance    Time 8    Period Weeks    Status New    Target Date 05/13/20      PT LONG TERM GOAL #3   Title Patient will report at least 50% improvement in overall symptoms and/or function to demonstrate improved functional mobility    Time 8    Period Weeks    Status New    Target Date 05/13/20                 Plan - 03/19/20 0913    Clinical Impression Statement Slow labored movements with all activities today. Constant encouragement throughout session to continue to move and perform movements. Long rest breaks required throughout session to reduce tremors with fatigue. Increased pain noted with all activities and end of session. Added new exercises to HEP.    Personal Factors and Comorbidities Comorbidity 1;Comorbidity 2;Comorbidity 3+    Comorbidities fibromyalgia, RA, myofascial pain syndrome    Examination-Activity Limitations Bed Mobility;Lift;Locomotion Level;Transfers;Stand;Squat;Sit;Bathing;Bend;Carry    Examination-Participation Restrictions Meal Prep;Community Activity;Cleaning;Shop    Stability/Clinical Decision Making Evolving/Moderate complexity    Rehab Potential  Fair    PT Frequency Other (comment)   1-2x/week for total of 12 visits  over 8 week certification period   PT Duration 8 weeks    PT Treatment/Interventions ADLs/Self Care Home Management;Aquatic Therapy;Cryotherapy;Electrical Stimulation;Moist Heat;Traction;Balance training;Therapeutic exercise;Therapeutic activities;Functional mobility training;Stair training;Gait training;DME Instruction;Neuromuscular re-education;Patient/family education;Manual techniques;Passive range of motion    PT Next Visit Plan functional strength, walking, pain management strategies, aquatics    PT Home Exercise Plan LAQs, hip add isometrics seated, marching    Consulted and Agree with Plan of Care Patient           Patient will benefit from skilled therapeutic intervention in order to improve the following deficits and impairments:  Decreased endurance,Decreased activity tolerance,Decreased knowledge of use of DME,Decreased strength,Pain,Difficulty walking,Decreased mobility,Decreased balance,Decreased range of motion  Visit Diagnosis: Difficulty in walking, not elsewhere classified  Muscle weakness (generalized)  Generalized pain     Problem List Patient Active Problem List   Diagnosis Date Noted  . Insomnia due to medical condition 07/17/2019  . Encounter for long-term use of opiate analgesic 04/08/2019  . Encounter for medication monitoring 04/08/2019  . 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 Trochanteric bursitis of left hip 03/11/2019  . S/P hysterectomy 02/06/2019  . S/P TAH-BSO (total abdominal hysterectomy and bilateral salpingo-oophorectomy) 02/06/2019  . Myofascial pain dysfunction syndrome 09/27/2018  . Chronic bilateral back pain 09/03/2018  . Colitis 08/12/2015  . Oral thrush 11/18/2014  . B12 deficiency 11/17/2014  . Chronic pain syndrome 11/17/2014  . Numbness 11/17/2014  . Intractable headache 11/17/2014  . Intractable chronic migraine without aura and without status migrainosus  11/17/2014  . Migraines 10/11/2014  . Primary fibromyalgia syndrome 10/11/2014  . Lower extremity weakness 10/11/2014  . Vitamin B12 deficiency 10/11/2014  . Anxiety state 10/11/2014  . Nausea & vomiting 10/08/2014  . Headache, tension type, chronic 09/07/2012  . Abdominal pain 06/15/2012   9:53 AM, 03/19/20 Tereasa Coop, DPT Physical Therapy with [facility]  [phone] office  [facility] [address] [place], [place], [address] Phone: [phone]   Fax:  [phone]  Name: TOBIE PERDUE MRN: [id number] Date of Birth: 1985/06/09

## 2020-03-20 ENCOUNTER — Encounter
Payer: Medicaid Other | Attending: Physical Medicine and Rehabilitation | Admitting: Physical Medicine and Rehabilitation

## 2020-03-20 ENCOUNTER — Other Ambulatory Visit: Payer: Self-pay

## 2020-03-20 ENCOUNTER — Encounter: Payer: Self-pay | Admitting: Physical Medicine and Rehabilitation

## 2020-03-20 VITALS — BP 129/87 | HR 113 | Temp 98.8°F | Ht 60.0 in | Wt 181.0 lb

## 2020-03-20 DIAGNOSIS — G894 Chronic pain syndrome: Secondary | ICD-10-CM | POA: Diagnosis not present

## 2020-03-20 DIAGNOSIS — Z5181 Encounter for therapeutic drug level monitoring: Secondary | ICD-10-CM | POA: Insufficient documentation

## 2020-03-20 DIAGNOSIS — M797 Fibromyalgia: Secondary | ICD-10-CM | POA: Diagnosis present

## 2020-03-20 DIAGNOSIS — G43519 Persistent migraine aura without cerebral infarction, intractable, without status migrainosus: Secondary | ICD-10-CM | POA: Insufficient documentation

## 2020-03-20 DIAGNOSIS — M7918 Myalgia, other site: Secondary | ICD-10-CM | POA: Insufficient documentation

## 2020-03-20 DIAGNOSIS — Z79891 Long term (current) use of opiate analgesic: Secondary | ICD-10-CM | POA: Insufficient documentation

## 2020-03-20 MED ORDER — DICLOFENAC SODIUM 50 MG PO TBEC: 50.0000 mg | DELAYED_RELEASE_TABLET | Freq: Two times a day (BID) | ORAL | 5 refills | Status: DC | PRN

## 2020-03-20 NOTE — Patient Instructions (Signed)
Patient is is 35 yr old female with hx of chronic pain syndrome- with myofascial pain syndrome, fibromyalgiahere for f/u and trigger point injections. Also has sickle cell disease.   Plan: 1. Regular PCP ordered therapy for her- started this week- cont with PT  2. Patient here for trigger point injections for  Consent done and on chart.  Cleaned areas with alcohol and injected using a 27 gauge 1.5 inch needle  Injected 9cc Using 1% Lidocaine with no EPI  Upper traps B/L Levators B/L Posterior scalenes B/L Middle scalenes Splenius Capitus B/L Pectoralis Major B/L Rhomboids B/L Infraspinatus Teres Major/minor Thoracic paraspinals Lumbar paraspinals Other injections-  B/L hands and B/L anterior tibialis   Patient's level of pain prior was 10/10 Current level of pain after injections is- 9/10  There was no bleeding or complications.  Patient was advised to drink a lot of water on day after injections to flush system Will have increased soreness for 12-48 hours after injections.  Can use Lidocaine patches the day AFTER injections Can use theracane on day of injections in places didn't inject Can use heating pad 4-6 hours AFTER injections  3. Will suggest Diclofenac 50 mg 2x/day as needed for tendinitis in hands and feet- watch for hives- but can help swelling/pain of hands.   4. Not due to refills yet.    5. F/U in 1 month for trigger point injections. -

## 2020-03-20 NOTE — Progress Notes (Signed)
Subjective:    Patient ID: Tanya Krause, female    DOB: January 13, 1986, 36 y.o.   MRN: [id number]  HPI   Patient is is 35 yr old female with hx of chronic pain syndrome- with myofascial pain syndrome, fibromyalgiahere for f/u and trigger point injections.   Hands and legs are aching real bad.   Here for trigger point injections today.  Hurts worse because of transportation- and was cold and no support for back.          Pain Inventory Average Pain 10 Pain Right Now 9 My pain is constant, sharp, burning, dull, stabbing, tingling and aching  In the last 24 hours, has pain interfered with the following? General activity 1 Relation with others 8 Enjoyment of life 2 What TIME of day is your pain at its worst? morning , daytime, evening and night Sleep (in general) Poor  Pain is worse with: walking, bending, sitting, inactivity and standing Pain improves with: nothing improves pain Relief from Meds: 2  Family History  Problem Relation Age of Onset  . Hypertension Mother   . Diabetes Mother   . Heart failure Father   . Colon cancer Paternal Grandfather    Social History   Socioeconomic History  . Marital status: Single    Spouse name: Not on file  . Number of children: 4  . Years of education: 42  . Highest education level: Not on file  Occupational History    Comment: unemployed  Tobacco Use  . Smoking status: Current Some Day Smoker    Packs/day: 0.14    Years: 5.00    Pack years: 0.70    Types: Cigarettes  . Smokeless tobacco: Never Used  . Tobacco comment: 3 cig daily  Vaping Use  . Vaping Use: Some days  . Substances: CBD  Substance and Sexual Activity  . Alcohol use: Not Currently  . Drug use: Yes    Types: Marijuana    Comment: last used september 2020  . Sexual activity: Yes    Birth control/protection: Surgical  Other Topics Concern  . Not on file  Social History Narrative   Lives with parents, children   Caffeine use- coffee once a day,  sodas x 2 a day   Social Determinants of Health   Financial Resource Strain: Not on file  Food Insecurity: Not on file  Transportation Needs: Not on file  Physical Activity: Not on file  Stress: Not on file  Social Connections: Not on file   Past Surgical History:  Procedure Laterality Date  . HYSTERECTOMY ABDOMINAL WITH SALPINGO-OOPHORECTOMY Bilateral 02/06/2019   Procedure: HYSTERECTOMY ABDOMINAL WITH BILATERAL SALPINGO-OOPHORECTOMY;  Surgeon: Lazaro Arms, MD;  Location: [facility];  Service: Gynecology;  Laterality: Bilateral;  . kidney infections    . TUBAL LIGATION    . WISDOM TOOTH EXTRACTION     Past Surgical History:  Procedure Laterality Date  . HYSTERECTOMY ABDOMINAL WITH SALPINGO-OOPHORECTOMY Bilateral 02/06/2019   Procedure: HYSTERECTOMY ABDOMINAL WITH BILATERAL SALPINGO-OOPHORECTOMY;  Surgeon: Lazaro Arms, MD;  Location: [facility];  Service: Gynecology;  Laterality: Bilateral;  . kidney infections    . TUBAL LIGATION    . WISDOM TOOTH EXTRACTION     Past Medical History:  Diagnosis Date  . Asthma   . Chronic abdominal pain   . Chronic migraine   . Chronic pain syndrome   . Fibromyalgia   . 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 Hypertension   . Migraine   .  Pelvic inflammatory disease (PID)    LMP 12/04/2018   Opioid Risk Score:   Fall Risk Score:  `1  Depression screen PHQ 2/9  Depression screen [facility] 2/9 09/30/2019 09/30/2019 07/17/2019 06/19/2019 12/25/2018 10/25/2018 09/03/2018  Decreased Interest 3 3 2 3  0 1 0  Down, Depressed, Hopeless 3 3 2 3  0 1 1  PHQ - 2 Score 6 6 4 6  0 2 1  Altered sleeping 3 - - - - - 3  Tired, decreased energy 3 - - - - - 3  Change in appetite 3 - - - - - 2  Feeling bad or failure about yourself  1 - - - - - 0  Trouble concentrating 3 - - - - - 2  Moving slowly or fidgety/restless 1 - - - - - 0  Suicidal thoughts 0 - - - - - 0  PHQ-9 Score 20 - - - - - 11  Some recent data might be hidden   Review of Systems  Musculoskeletal:  Positive for back pain, gait problem and neck pain.       Pain in all hands, legs, shoulders  All other systems reviewed and are negative.      Objective:   Physical Exam Awake, alert, appropriate, moaning, wrapped in blanket, NAD TTP over stable trigger points in neck, shoulders back and hands TTP also over 1st/2nd interosseus space B/L 2nd digit tendinitis B/L with accompanying swelling TTP over anterior tib B/L       Assessment & Plan:   Patient is is 35 yr old female with hx of chronic pain syndrome- with myofascial pain syndrome, fibromyalgiahere for f/u and trigger point injections. Also has sickle cell disease.   Plan: 1. Regular PCP ordered therapy for her- started this week- cont with PT  2. Patient here for trigger point injections for  Consent done and on chart.  Cleaned areas with alcohol and injected using a 27 gauge 1.5 inch needle  Injected 9cc Using 1% Lidocaine with no EPI  Upper traps B/L Levators B/L Posterior scalenes B/L Middle scalenes Splenius Capitus B/L Pectoralis Major B/L Rhomboids B/L Infraspinatus Teres Major/minor Thoracic paraspinals Lumbar paraspinals Other injections-  B/L hands and B/L anterior tibialis   Patient's level of pain prior was 10/10 Current level of pain after injections is- 9/10  There was no bleeding or complications.  Patient was advised to drink a lot of water on day after injections to flush system Will have increased soreness for 12-48 hours after injections.  Can use Lidocaine patches the day AFTER injections Can use theracane on day of injections in places didn't inject Can use heating pad 4-6 hours AFTER injections  3. Will suggest Diclofenac 50 mg 2x/day as needed for tendinitis in hands and feet- watch for hives- but can help swelling/pain of hands.   4. Not due to refills yet.    5. F/U in 1 month for trigger point injections. -   I spent a total of 25 minutes going over plan and discussing  scoliosis and not wanting to change doctors as well as doing injections.

## 2020-03-23 ENCOUNTER — Ambulatory Visit (HOSPITAL_COMMUNITY): Payer: Medicaid Other | Admitting: Physical Therapy

## 2020-03-23 ENCOUNTER — Encounter (HOSPITAL_COMMUNITY): Payer: Self-pay | Admitting: Physical Therapy

## 2020-03-23 ENCOUNTER — Other Ambulatory Visit: Payer: Self-pay

## 2020-03-23 DIAGNOSIS — R52 Pain, unspecified: Secondary | ICD-10-CM

## 2020-03-23 DIAGNOSIS — R262 Difficulty in walking, not elsewhere classified: Secondary | ICD-10-CM | POA: Diagnosis not present

## 2020-03-23 DIAGNOSIS — M6281 Muscle weakness (generalized): Secondary | ICD-10-CM

## 2020-03-23 NOTE — Therapy (Signed)
[place] [facility] [address] [place], [place], [address] Phone: [phone]   Fax:  [phone]  Physical Therapy Treatment  Patient Details  Name: Tanya Krause MRN: [id number] Date of Birth: 05/17/1985 Referring Provider (PT): Verlon Au, MS   Encounter Date: 03/23/2020   PT End of Session - 03/23/20 1001    Visit Number 3    Number of Visits 12    Date for PT Re-Evaluation 05/13/20    Authorization Type medicaid UHC no auth for adults, sending order for OT -  may have to split visits    Authorization - Visit Number 2    Authorization - Number of Visits 27    Progress Note Due on Visit 10    PT Start Time [phone]    PT Stop Time 1033    PT Time Calculation (min) 40 min    Equipment Utilized During Treatment Back brace    Activity Tolerance Patient limited by pain;Patient limited by lethargy;Patient limited by fatigue    Behavior During Therapy [facility] for tasks assessed/performed           Past Medical History:  Diagnosis Date  . Asthma   . Chronic abdominal pain   . Chronic migraine   . Chronic pain syndrome   . Fibromyalgia   . 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 Hypertension   . Migraine   . Pelvic inflammatory disease (PID)     Past Surgical History:  Procedure Laterality Date  . HYSTERECTOMY ABDOMINAL WITH SALPINGO-OOPHORECTOMY Bilateral 02/06/2019   Procedure: HYSTERECTOMY ABDOMINAL WITH BILATERAL SALPINGO-OOPHORECTOMY;  Surgeon: Lazaro Arms, MD;  Location: [facility];  Service: Gynecology;  Laterality: Bilateral;  . kidney infections    . TUBAL LIGATION    . WISDOM TOOTH EXTRACTION      There were no vitals filed for this visit.   Subjective Assessment - 03/23/20 1003    Subjective Patient says she was sore after last time. She says home exercise is hard but she does it.    Pertinent History scoliosis, fibromyalgia, RA, myofascial pain syndrome    How long can you stand comfortably? 4-5    How long can you  walk comfortably? 1    Patient Stated Goals to be able to function better    Currently in Pain? Yes    Pain Score 9     Pain Location Generalized    Pain Descriptors / Indicators Aching;Throbbing;Constant;Sharp;Shooting    Pain Type Chronic pain    Pain Onset More than a month ago    Pain Frequency Constant                             [facility] Adult PT Treatment/Exercise - 03/23/20 0001      Knee/Hip Exercises: Stretches   Gastroc Stretch Both;3 reps;30 seconds    Gastroc Stretch Limitations partial on incline slope      Knee/Hip Exercises: Standing   Heel Raises Both;10 reps    Gait Training 14feet and 75 feet throughout session, very slow, labored movement, frequent standing rest breaks      Knee/Hip Exercises: Seated   Long Arc Quad Both;10 reps    Other Seated Knee/Hip Exercises seated heel/ toe raises x10 each    Other Seated Knee/Hip Exercises diaphragm breathing x10 deep breathes    Marching Both;10 reps  PT Short Term Goals - 03/18/20 0951      PT SHORT TERM GOAL #1   Title Patient will be independent in self management strategies to improve quality of life and functional outcomes.    Time 4    Period Weeks    Status New    Target Date 04/15/20      PT SHORT TERM GOAL #2   Title Patient will report at least 25% improvement in overall symptoms and/or function to demonstrate improved functional mobility    Time 4    Period Weeks    Status New    Target Date 04/15/20      PT SHORT TERM GOAL #3   Title Patient will be able to walk at least 75 feet with RW in to demonstrate improved walking endurance    Time 4    Period Weeks    Status New    Target Date 04/15/20      PT SHORT TERM GOAL #4   Title Patient will be able to demonstrate at least 2 sit to stands in 30 seconds sit to stand test with use of one hand as needed    Time 4    Period Weeks    Status New    Target Date 04/15/20             PT Long  Term Goals - 03/18/20 0951      PT LONG TERM GOAL #1   Title Patient will be able to demonstrate at least 4 sit to stands in 30 seconds sit to stand test with use of one hand as needed    Time 8    Period Weeks    Status New    Target Date 05/13/20      PT LONG TERM GOAL #2   Title Patient will be able to walk at least 125 feet with RW in to demonstrate improved walking endurance    Time 8    Period Weeks    Status New    Target Date 05/13/20      PT LONG TERM GOAL #3   Title Patient will report at least 50% improvement in overall symptoms and/or function to demonstrate improved functional mobility    Time 8    Period Weeks    Status New    Target Date 05/13/20                 Plan - 03/23/20 1200    Clinical Impression Statement Activity graded per patient tolerance. Patient continues to demo high level of fatigue and requires frequent breaks for rest. Patient shows good return of seated HEP exercise, but performs in slow, labored manner. Added standing calf stretching for LE mobility and added supported calf raises for LE strengthening. Patient noting increased muscle fatigue with this. Added diaphragm breathing for core strength and reduced muscle tension. Patient educated on purpose of exercise and cued with tactile use of hands more diaphragm activation. Patient will continue to benefit from skilled therapy services to address deficits and improve functional ability.    Personal Factors and Comorbidities Comorbidity 1;Comorbidity 2;Comorbidity 3+    Comorbidities fibromyalgia, RA, myofascial pain syndrome    Examination-Activity Limitations Bed Mobility;Lift;Locomotion Level;Transfers;Stand;Squat;Sit;Bathing;Bend;Carry    Examination-Participation Restrictions Meal Prep;Community Activity;Cleaning;Shop    Stability/Clinical Decision Making Evolving/Moderate complexity    Rehab Potential Fair    PT Frequency Other (comment)   1-2x/week for total of 12 visits over 8  week certification period  PT Duration 8 weeks    PT Treatment/Interventions ADLs/Self Care Home Management;Aquatic Therapy;Cryotherapy;Electrical Stimulation;Moist Heat;Traction;Balance training;Therapeutic exercise;Therapeutic activities;Functional mobility training;Stair training;Gait training;DME Instruction;Neuromuscular re-education;Patient/family education;Manual techniques;Passive range of motion    PT Next Visit Plan functional strength, walking, pain management strategies, aquatics. Add step ups    PT Home Exercise Plan LAQs, hip add isometrics seated, marching    Consulted and Agree with Plan of Care Patient           Patient will benefit from skilled therapeutic intervention in order to improve the following deficits and impairments:  Decreased endurance,Decreased activity tolerance,Decreased knowledge of use of DME,Decreased strength,Pain,Difficulty walking,Decreased mobility,Decreased balance,Decreased range of motion  Visit Diagnosis: Difficulty in walking, not elsewhere classified  Muscle weakness (generalized)  Generalized pain     Problem List Patient Active Problem List   Diagnosis Date Noted  . Insomnia due to medical condition 07/17/2019  . Encounter for long-term use of opiate analgesic 04/08/2019  . Encounter for medication monitoring 04/08/2019  . 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 Trochanteric bursitis of left hip 03/11/2019  . S/P hysterectomy 02/06/2019  . S/P TAH-BSO (total abdominal hysterectomy and bilateral salpingo-oophorectomy) 02/06/2019  . Myofascial pain dysfunction syndrome 09/27/2018  . Chronic bilateral back pain 09/03/2018  . Colitis 08/12/2015  . Oral thrush 11/18/2014  . B12 deficiency 11/17/2014  . Chronic pain syndrome 11/17/2014  . Numbness 11/17/2014  . Intractable headache 11/17/2014  . Intractable chronic migraine without aura and without status  migrainosus 11/17/2014  . Migraines 10/11/2014  . Primary fibromyalgia syndrome 10/11/2014  . Lower extremity weakness 10/11/2014  . Vitamin B12 deficiency 10/11/2014  . Anxiety state 10/11/2014  . Nausea & vomiting 10/08/2014  . Headache, tension type, chronic 09/07/2012  . Abdominal pain 06/15/2012   12:03 PM, 03/23/20 Georges Lynch PT DPT  Physical Therapist with [facility]  [facility]  [phone]   [facility] [address] [place], [place], [address] Phone: [phone]   Fax:  [phone]  Name: CHARMAIN DIOSDADO MRN: [id number] Date of Birth: 1985-10-04

## 2020-03-31 ENCOUNTER — Encounter (HOSPITAL_COMMUNITY): Payer: Self-pay

## 2020-03-31 ENCOUNTER — Ambulatory Visit (HOSPITAL_COMMUNITY): Payer: Medicaid Other

## 2020-03-31 ENCOUNTER — Other Ambulatory Visit: Payer: Self-pay

## 2020-03-31 DIAGNOSIS — M6281 Muscle weakness (generalized): Secondary | ICD-10-CM

## 2020-03-31 DIAGNOSIS — R262 Difficulty in walking, not elsewhere classified: Secondary | ICD-10-CM | POA: Diagnosis not present

## 2020-03-31 DIAGNOSIS — R52 Pain, unspecified: Secondary | ICD-10-CM

## 2020-03-31 NOTE — Therapy (Addendum)
[place] [facility] [address] [place], [place], [address] Phone: [phone]   Fax:  [phone]  Physical Therapy Treatment  Patient Details  Name: Tanya Krause MRN: [id number] Date of Birth: [birth year] Referring Provider (PT): Verlon Au, MS   Encounter Date: 03/31/2020   PT End of Session - 03/31/20 1035    Visit Number 4    Number of Visits 12    Date for PT Re-Evaluation 05/13/20    Authorization Type medicaid UHC no auth for adults, received order to begin OT -  may have to split visits    Authorization - Visit Number 3    Authorization - Number of Visits 27    Progress Note Due on Visit 935    PT Start Time 0935    PT Stop Time 1020    PT Time Calculation (min) 45 min    Equipment Utilized During Treatment Back brace   Removed during portion of session   Activity Tolerance No increased pain;Patient limited by fatigue;Patient limited by lethargy    Behavior During Therapy [facility] for tasks assessed/performed           Past Medical History:  Diagnosis Date  . Asthma   . Chronic abdominal pain   . Chronic migraine   . Chronic pain syndrome   . Fibromyalgia   . 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 Hypertension   . Migraine   . Pelvic inflammatory disease (PID)     Past Surgical History:  Procedure Laterality Date  . HYSTERECTOMY ABDOMINAL WITH SALPINGO-OOPHORECTOMY Bilateral 02/06/2019   Procedure: HYSTERECTOMY ABDOMINAL WITH BILATERAL SALPINGO-OOPHORECTOMY;  Surgeon: Lazaro Arms, MD;  Location: [facility];  Service: Gynecology;  Laterality: Bilateral;  . kidney infections    . TUBAL LIGATION    . WISDOM TOOTH EXTRACTION      There were no vitals filed for this visit.   Subjective Assessment - 03/31/20 0942    Subjective Reports she has throbbing, stabbing, constant pain in spine 10/10.  Doesn't feel she needs to go to ER though.  Reports some compliance wiht HEP for a couple reps at a time.  Reports she had  difficulty sleeping last night, started off in bed then recliner.    Pertinent History scoliosis, fibromyalgia, RA, myofascial pain syndrome    Patient Stated Goals to be able to function better    Currently in Pain? Yes    Pain Score 10-Worst pain ever    Pain Location Back    Pain Descriptors / Indicators Throbbing;Stabbing;Constant;[facility] Adult PT Treatment/Exercise - 03/31/20 0001      Ambulation/Gait   Ambulation/Gait Yes    Ambulation/Gait Assistance 5: Supervision    Ambulation Distance (Feet) 48 Feet    Assistive device --   rollator   Gait Comments      Knee/Hip Exercises: Stretches   Passive Hamstring Stretch 2 reps;30 seconds    Passive Hamstring Stretch Limitations sitting tall with foot on 12in step ankle pumps      Knee/Hip Exercises: Aerobic   Nustep 5 minutes - assist to get feet on pedals- cues to push/pull throughout time on nustep, then 1 minute with constant encouragment to keep screen on (pedal faster).  MHP for pain control      Knee/Hip Exercises: Standing   Heel  Raises Both;2 sets;5 reps    Heel Raises Limitations heavy UE support    Hip Flexion Both;2 sets;5 reps;Knee bent    Hip Flexion Limitations alternating marching wiht UE support    Gait Training 48; 2sets of 20ft wiht rollator    Other Standing Knee Exercises standing tall 2x (UE support required)      Knee/Hip Exercises: Seated   Long Arc Quad Both;10 reps    Long Arc Quad Limitations lacking full range    Marching 2 sets;5 reps                  PT Education - 03/31/20 0950    Education Details Educated on use of belt reduce need for muscle activation to assist with strengthening.  Discussed importance of compliance wiht HEP for maximal benefits with therpay.  Sleeping positions.  Calming strageties.  Need for abdominal strength for lumbar support.    Person(s) Educated Patient    Methods Explanation;Demonstration     Comprehension Verbalized understanding            PT Short Term Goals - 03/18/20 0951      PT SHORT TERM GOAL #1   Title Patient will be independent in self management strategies to improve quality of life and functional outcomes.    Time 4    Period Weeks    Status New    Target Date 04/15/20      PT SHORT TERM GOAL #2   Title Patient will report at least 25% improvement in overall symptoms and/or function to demonstrate improved functional mobility    Time 4    Period Weeks    Status New    Target Date 04/15/20      PT SHORT TERM GOAL #3   Title Patient will be able to walk at least 75 feet with RW in to demonstrate improved walking endurance    Time 4    Period Weeks    Status New    Target Date 04/15/20      PT SHORT TERM GOAL #4   Title Patient will be able to demonstrate at least 2 sit to stands in 30 seconds sit to stand test with use of one hand as needed    Time 4    Period Weeks    Status New    Target Date 04/15/20             PT Long Term Goals - 03/18/20 0951      PT LONG TERM GOAL #1   Title Patient will be able to demonstrate at least 4 sit to stands in 30 seconds sit to stand test with use of one hand as needed    Time 8    Period Weeks    Status New    Target Date 05/13/20      PT LONG TERM GOAL #2   Title Patient will be able to walk at least 125 feet with RW in to demonstrate improved walking endurance    Time 8    Period Weeks    Status New    Target Date 05/13/20      PT LONG TERM GOAL #3   Title Patient will report at least 50% improvement in overall symptoms and/or function to demonstrate improved functional mobility    Time 8    Period Weeks    Status New    Target Date 05/13/20  Plan - 03/31/20 1036    Clinical Impression Statement Pt limited by pain.  Discussed use of lumbar belt and importance of core activation and seated posture as well as strategies to calm CNS to assist wiht improved  sleeping (meditation, reading, deep breathing, positions) to assist with pain control.  Pt continues to demonstrate high level or fatigue and required frequent seated rest breaks.  complete this sessoin with improved to 20ft (was 75ft initial eval.)  Seated exercises complete without break for increased abdominal activation wiht cueing for continued breathing, fatigues quickly.  Pt presents with lacking full range with seated exercises.  Progressed to standing marching with reports of pulling posterior LE.  Added long seated hamstring stretch.  Step ups not added this session due to pain and weakness.  EOS on nustep for strengthening and activiity tolerance, added MHP during bike with reports of LBP reduced at EOS.  Received signed referral for OT, encouraged pt to make apt on way out.    Personal Factors and Comorbidities Comorbidity 1;Comorbidity 2;Comorbidity 3+    Comorbidities fibromyalgia, RA, myofascial pain syndrome    Examination-Activity Limitations Bed Mobility;Lift;Locomotion Level;Transfers;Stand;Squat;Sit;Bathing;Bend;Carry    Examination-Participation Restrictions Meal Prep;Community Activity;Cleaning;Shop    Stability/Clinical Decision Making Evolving/Moderate complexity    Clinical Decision Making Moderate    Rehab Potential Fair    PT Frequency --   1-2x/week for total of 12 visits over 8 week certification period   PT Duration 8 weeks    PT Treatment/Interventions ADLs/Self Care Home Management;Aquatic Therapy;Cryotherapy;Electrical Stimulation;Moist Heat;Traction;Balance training;Therapeutic exercise;Therapeutic activities;Functional mobility training;Stair training;Gait training;DME Instruction;Neuromuscular re-education;Patient/family education;Manual techniques;Passive range of motion    PT Next Visit Plan functional strength, walking, pain management strategies, aquatics. Add step ups    PT Home Exercise Plan LAQs, hip add isometrics seated, marching    Consulted and Agree  with Plan of Care Patient           Patient will benefit from skilled therapeutic intervention in order to improve the following deficits and impairments:  Decreased endurance,Decreased activity tolerance,Decreased knowledge of use of DME,Decreased strength,Pain,Difficulty walking,Decreased mobility,Decreased balance,Decreased range of motion  Visit Diagnosis: Difficulty in walking, not elsewhere classified  Muscle weakness (generalized)  Generalized pain     Problem List Patient Active Problem List   Diagnosis Date Noted  . Insomnia due to medical condition 07/17/2019  . Encounter for long-term use of opiate analgesic 04/08/2019  . Encounter for medication monitoring 04/08/2019  . 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 Trochanteric bursitis of left hip 03/11/2019  . S/P hysterectomy 02/06/2019  . S/P TAH-BSO (total abdominal hysterectomy and bilateral salpingo-oophorectomy) 02/06/2019  . Myofascial pain dysfunction syndrome 09/27/2018  . Chronic bilateral back pain 09/03/2018  . Colitis 08/12/2015  . Oral thrush 11/18/2014  . B12 deficiency 11/17/2014  . Chronic pain syndrome 11/17/2014  . Numbness 11/17/2014  . Intractable headache 11/17/2014  . Intractable chronic migraine without aura and without status migrainosus 11/17/2014  . Migraines 10/11/2014  . Primary fibromyalgia syndrome 10/11/2014  . Lower extremity weakness 10/11/2014  . Vitamin B12 deficiency 10/11/2014  . Anxiety state 10/11/2014  . Nausea & vomiting 10/08/2014  . Headache, tension type, chronic 09/07/2012  . Abdominal pain 06/15/2012   Becky Sax, LPTA/CLT; CBIS [phone]  Juel Burrow 03/31/2020, 11:03 AM  [facility] [address] [place], [place], [address] Phone: [phone]   Fax:  [phone]  Name: Tanya Krause MRN: [id number] Date  of Birth:  [birth year]

## 2020-04-02 ENCOUNTER — Ambulatory Visit (HOSPITAL_COMMUNITY): Payer: Medicaid Other | Admitting: Physical Therapy

## 2020-04-03 ENCOUNTER — Other Ambulatory Visit: Payer: Self-pay | Admitting: Physical Medicine and Rehabilitation

## 2020-04-03 NOTE — Telephone Encounter (Signed)
Have refilled meds- thank you, ML

## 2020-04-06 LAB — TOXASSURE SELECT,+ANTIDEPR,UR

## 2020-04-07 ENCOUNTER — Ambulatory Visit (HOSPITAL_COMMUNITY): Payer: Medicaid Other

## 2020-04-07 ENCOUNTER — Telehealth (HOSPITAL_COMMUNITY): Payer: Self-pay

## 2020-04-07 NOTE — Telephone Encounter (Signed)
No show, called and spoke to pt who stated she had death in family and unable to make it to therapy today.  Reminded next apt date and time.  Becky Sax, LPTA/CLT; [place] [phone]

## 2020-04-08 ENCOUNTER — Ambulatory Visit (HOSPITAL_COMMUNITY): Payer: Medicaid Other | Admitting: Specialist

## 2020-04-09 ENCOUNTER — Telehealth: Payer: Self-pay | Admitting: *Deleted

## 2020-04-09 NOTE — Telephone Encounter (Signed)
Urine drug screen for this encounter is consistent for prescribed medication 

## 2020-04-16 ENCOUNTER — Telehealth (HOSPITAL_COMMUNITY): Payer: Self-pay | Admitting: Specialist

## 2020-04-16 ENCOUNTER — Ambulatory Visit (HOSPITAL_COMMUNITY): Payer: Medicaid Other | Admitting: Specialist

## 2020-04-16 ENCOUNTER — Ambulatory Visit (HOSPITAL_COMMUNITY): Payer: Medicaid Other

## 2020-04-18 ENCOUNTER — Emergency Department (HOSPITAL_COMMUNITY): Payer: Medicaid Other

## 2020-04-18 ENCOUNTER — Encounter (HOSPITAL_COMMUNITY): Payer: Self-pay

## 2020-04-18 ENCOUNTER — Emergency Department (HOSPITAL_COMMUNITY)
Admission: EM | Admit: 2020-04-18 | Discharge: 2020-04-19 | Disposition: A | Discharge status: Home / Self Care | Payer: Medicaid Other | Attending: Emergency Medicine | Admitting: Emergency Medicine

## 2020-04-18 ENCOUNTER — Other Ambulatory Visit: Payer: Self-pay

## 2020-04-18 DIAGNOSIS — R109 Unspecified abdominal pain: Secondary | ICD-10-CM | POA: Diagnosis not present

## 2020-04-18 DIAGNOSIS — F17210 Nicotine dependence, cigarettes, uncomplicated: Secondary | ICD-10-CM | POA: Insufficient documentation

## 2020-04-18 DIAGNOSIS — Z7951 Long term (current) use of inhaled steroids: Secondary | ICD-10-CM | POA: Diagnosis not present

## 2020-04-18 DIAGNOSIS — I10 Essential (primary) hypertension: Secondary | ICD-10-CM | POA: Diagnosis not present

## 2020-04-18 DIAGNOSIS — Z79899 Other long term (current) drug therapy: Secondary | ICD-10-CM | POA: Diagnosis not present

## 2020-04-18 DIAGNOSIS — R112 Nausea with vomiting, unspecified: Secondary | ICD-10-CM | POA: Diagnosis not present

## 2020-04-18 DIAGNOSIS — Z91040 Latex allergy status: Secondary | ICD-10-CM | POA: Insufficient documentation

## 2020-04-18 DIAGNOSIS — J45909 Unspecified asthma, uncomplicated: Secondary | ICD-10-CM | POA: Diagnosis not present

## 2020-04-18 LAB — URINALYSIS, ROUTINE W REFLEX MICROSCOPIC
Bacteria, UA: NONE SEEN
Bilirubin Urine: NEGATIVE
Glucose, UA: NEGATIVE mg/dL
Ketones, ur: NEGATIVE mg/dL
Leukocytes,Ua: NEGATIVE
Nitrite: NEGATIVE
Protein, ur: NEGATIVE mg/dL
Specific Gravity, Urine: 1.025 (ref 1.005–1.030)
pH: 5 (ref 5.0–8.0)

## 2020-04-18 LAB — COMPREHENSIVE METABOLIC PANEL
ALT: 20 U/L (ref 0–44)
AST: 23 U/L (ref 15–41)
Albumin: 4.1 g/dL (ref 3.5–5.0)
Alkaline Phosphatase: 64 U/L (ref 38–126)
Anion gap: 12 (ref 5–15)
BUN: 11 mg/dL (ref 6–20)
CO2: 20 mmol/L — ABNORMAL LOW (ref 22–32)
Calcium: 8.9 mg/dL (ref 8.9–10.3)
Chloride: 104 mmol/L (ref 98–111)
Creatinine, Ser: 0.78 mg/dL (ref 0.44–1.00)
GFR, Estimated: >60 mL/min (ref >60)
Glucose, Bld: 82 mg/dL (ref 70–99)
Potassium: 3.9 mmol/L (ref 3.5–5.1)
Sodium: 136 mmol/L (ref 135–145)
Total Bilirubin: 0.4 mg/dL (ref 0.3–1.2)
Total Protein: 7.6 g/dL (ref 6.5–8.1)

## 2020-04-18 LAB — CBC WITH DIFFERENTIAL/PLATELET
Abs Immature Granulocytes: 0.07 10*3/uL (ref 0.00–0.07)
Basophils Absolute: 0.1 10*3/uL (ref 0.0–0.1)
Basophils Relative: 1 %
Eosinophils Absolute: 0.3 10*3/uL (ref 0.0–0.5)
Eosinophils Relative: 2 %
HCT: 37.1 % (ref 36.0–46.0)
Hemoglobin: 12.8 g/dL (ref 12.0–15.0)
Immature Granulocytes: 1 %
Lymphocytes Relative: 35 %
Lymphs Abs: 4.9 10*3/uL — ABNORMAL HIGH (ref 0.7–4.0)
MCH: 28.8 pg (ref 26.0–34.0)
MCHC: 34.5 g/dL (ref 30.0–36.0)
MCV: 83.4 fL (ref 80.0–100.0)
Monocytes Absolute: 1 10*3/uL (ref 0.1–1.0)
Monocytes Relative: 7 %
Neutro Abs: 7.8 10*3/uL — ABNORMAL HIGH (ref 1.7–7.7)
Neutrophils Relative %: 54 %
Platelets: 261 10*3/uL (ref 150–400)
RBC: 4.45 MIL/uL (ref 3.87–5.11)
RDW: 13.8 % (ref 11.5–15.5)
WBC: 14.1 10*3/uL — ABNORMAL HIGH (ref 4.0–10.5)
nRBC: 0.1 % (ref 0.0–0.2)

## 2020-04-18 LAB — RETICULOCYTES
Immature Retic Fract: 26.9 % — ABNORMAL HIGH (ref 2.3–15.9)
RBC.: 4.46 MIL/uL (ref 3.87–5.11)
Retic Count, Absolute: 73.6 10*3/uL (ref 19.0–186.0)
Retic Ct Pct: 1.7 % (ref 0.4–3.1)

## 2020-04-18 LAB — I-STAT BETA HCG BLOOD, ED (MC, WL, AP ONLY): I-stat hCG, quantitative: 11.2 m[IU]/mL — ABNORMAL HIGH (ref <5)

## 2020-04-18 LAB — LIPASE, BLOOD: Lipase: 29 U/L (ref 11–51)

## 2020-04-18 MED ORDER — SODIUM CHLORIDE 0.9 % IV SOLN
12.5000 mg | Freq: Once | INTRAVENOUS | Status: AC
  Administered 2020-04-18: 12.5 mg via INTRAVENOUS
  Filled 2020-04-18: qty 0.5

## 2020-04-18 MED ORDER — HYDROMORPHONE HCL 1 MG/ML IJ SOLN
1.0000 mg | Freq: Once | INTRAMUSCULAR | Status: AC
  Administered 2020-04-18: 1 mg via INTRAVENOUS
  Filled 2020-04-18: qty 1

## 2020-04-18 MED ORDER — DIPHENHYDRAMINE HCL 50 MG/ML IJ SOLN
12.5000 mg | Freq: Once | INTRAMUSCULAR | Status: AC
  Administered 2020-04-18: 12.5 mg via INTRAVENOUS
  Filled 2020-04-18: qty 1

## 2020-04-18 MED ORDER — SENNOSIDES-DOCUSATE SODIUM 8.6-50 MG PO TABS: 1.0000 | ORAL_TABLET | Freq: Every day | ORAL | 0 refills | Status: DC

## 2020-04-18 MED ORDER — ACETAMINOPHEN 325 MG PO TABS
650.0000 mg | ORAL_TABLET | Freq: Once | ORAL | Status: AC
  Administered 2020-04-18: 650 mg via ORAL
  Filled 2020-04-18: qty 2

## 2020-04-18 MED ORDER — SODIUM CHLORIDE 0.9 % IV SOLN
12.5000 mg | Freq: Once | INTRAVENOUS | Status: DC
  Filled 2020-04-18: qty 0.5

## 2020-04-18 MED ORDER — MINERAL OIL RE ENEM
1.0000 | ENEMA | Freq: Once | RECTAL | Status: AC
  Administered 2020-04-18: 1 via RECTAL
  Filled 2020-04-18: qty 1

## 2020-04-18 NOTE — ED Notes (Signed)
Patient given drink. 

## 2020-04-18 NOTE — ED Provider Notes (Signed)
[facility]-EMERGENCY DEPT Provider Note   CSN: [id number] Arrival date & time: 04/18/20  1902     History No chief complaint on file.   Tanya Krause is a 35 y.o. female.  Presented to the emergency room with concern for abdominal pain, constipation.  Patient reports that pain has been ongoing over the last few days.  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  Patient also has a history of chronic pain syndrome, chronic abdominal pain.  Patient states that she has not had a bowel movement in a couple weeks.  Had a couple episodes of nonbloody nonbilious emesis earlier today.  Some nausea present.  No fevers.  No dysuria or hematuria.  HPI     Past Medical History:  Diagnosis Date  . Asthma   . Chronic abdominal pain   . Chronic migraine   . Chronic pain syndrome   . Fibromyalgia   . 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 Hypertension   . Migraine   . Pelvic inflammatory disease (PID)     Patient Active Problem List   Diagnosis Date Noted  . Insomnia due to medical condition 07/17/2019  . Encounter for long-term use of opiate analgesic 04/08/2019  . Encounter for medication monitoring 04/08/2019  . 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 Trochanteric bursitis of left hip 03/11/2019  . S/P hysterectomy 02/06/2019  . S/P TAH-BSO (total abdominal hysterectomy and bilateral salpingo-oophorectomy) 02/06/2019  . Myofascial pain dysfunction syndrome 09/27/2018  . Chronic bilateral back pain 09/03/2018  . Colitis 08/12/2015  . Oral thrush 11/18/2014  . B12 deficiency 11/17/2014  . Chronic pain syndrome 11/17/2014  . Numbness 11/17/2014  . Intractable headache 11/17/2014  . Intractable chronic migraine without aura and without status migrainosus 11/17/2014  . Migraines 10/11/2014  . Primary fibromyalgia syndrome  10/11/2014  . Lower extremity weakness 10/11/2014  . Vitamin B12 deficiency 10/11/2014  . Anxiety state 10/11/2014  . Nausea & vomiting 10/08/2014  . Headache, tension type, chronic 09/07/2012  . Abdominal pain 06/15/2012    Past Surgical History:  Procedure Laterality Date  . HYSTERECTOMY ABDOMINAL WITH SALPINGO-OOPHORECTOMY Bilateral 02/06/2019   Procedure: HYSTERECTOMY ABDOMINAL WITH BILATERAL SALPINGO-OOPHORECTOMY;  Surgeon: Lazaro Arms, MD;  Location: [facility];  Service: Gynecology;  Laterality: Bilateral;  . kidney infections    . TUBAL LIGATION    . WISDOM TOOTH EXTRACTION       OB History    Gravida  4   Para  4   Term  1   Preterm  3   AB      Living  4     SAB      IAB      Ectopic      Multiple      Live Births  4           Family History  Problem Relation Age of Onset  . Hypertension Mother   . Diabetes Mother   . Heart failure Father   . Colon cancer Paternal Grandfather     Social History   Tobacco Use  . Smoking status: Current Some Day Smoker    Packs/day: 0.14    Years: 5.00    Pack years: 0.70    Types: Cigarettes  . Smokeless tobacco: Never Used  . Tobacco comment: 3 cig daily  Vaping Use  .  Vaping Use: Some days  . Substances: CBD  Substance Use Topics  . Alcohol use: Not Currently  . Drug use: Yes    Types: Marijuana    Comment: last used september 2020    Home Medications Prior to Admission medications   Medication Sig Start Date End Date Taking? Authorizing Provider  senna-docusate (SENOKOT-S) 8.6-50 MG tablet Take 1 tablet by mouth daily. 04/18/20  Yes Milagros Loll, MD  acetaminophen (TYLENOL) 500 MG tablet Take 1,000 mg by mouth every 6 (six) hours as needed.    [provider]  albuterol (ACCUNEB) 1.25 MG/3ML nebulizer solution Take 1 ampule by nebulization every 6 (six) hours as needed for wheezing or shortness of breath.  12/03/19   [provider]  busPIRone (BUSPAR) 5 MG tablet Take 1  tablet (5 mg total) by mouth 3 (three) times daily. For anxiety 11/01/19   Lovorn, Aundra Millet, MD  cyclobenzaprine (FLEXERIL) 10 MG tablet TAKE ONE TABLET BY MOUTH THREE TIMES DAILY AS NEEDED FOR MUSCLE SPASMS. Patient taking differently: Take 10 mg by mouth 3 (three) times daily as needed for muscle spasms. T 09/30/19   Lovorn, Aundra Millet, MD  diclofenac (VOLTAREN) 50 MG EC tablet Take 1 tablet (50 mg total) by mouth 2 (two) times daily as needed (for swelling of hands/tendinitis). 03/20/20   Lovorn, Aundra Millet, MD  diphenhydrAMINE (BENADRYL) 25 MG tablet Take 25 mg by mouth every 6 (six) hours as needed for itching or allergies.    [provider]  DULoxetine (CYMBALTA) 60 MG capsule Take 1 capsule (60 mg total) by mouth daily. 06/19/19   Lovorn, Aundra Millet, MD  EMGALITY 120 MG/ML SOAJ Inject 1 each into the skin every 30 (thirty) days.  10/31/19   [provider]  estradiol (ESTRACE) 2 MG tablet Take 1 tablet (2 mg total) by mouth daily. 02/07/19   Lazaro Arms, MD  eszopiclone (LUNESTA) 2 MG TABS tablet Take 1 tablet (2 mg total) by mouth at bedtime. Take immediately before bedtime 07/17/19   Lovorn, Aundra Millet, MD  FLOVENT HFA 220 MCG/ACT inhaler Inhale 2 puffs into the lungs 2 (two) times daily. 12/03/19   [provider]  FLUoxetine (PROZAC) 20 MG capsule Take 20 mg by mouth every morning. 02/27/20   [provider]  folic acid (FOLVITE) 1 MG tablet Take 1 tablet (1 mg total) by mouth daily. 10/17/14   Jerald Kief, MD  HYDROcodone-acetaminophen (NORCO/VICODIN) 5-325 MG tablet TAKE ONE TABLET BY MOUTH THREE TIMES DAILY AS NEEDED FOR PAIN. 04/03/20   Lovorn, Aundra Millet, MD  hydrOXYzine (ATARAX/VISTARIL) 25 MG tablet Take 1 tablet (25 mg total) by mouth every 8 (eight) hours as needed. 06/19/19   Lovorn, Aundra Millet, MD  lidocaine (LIDODERM) 5 % Place 3 patches onto the skin daily. Remove & Discard patch within 12 hours or as directed by MD Patient taking differently: Place 1 patch onto the skin daily.  05/13/19   Lovorn, Aundra Millet, MD  nystatin (MYCOSTATIN) 100000 UNIT/ML suspension TAKE ONE TEASPOONFUL ( ) BY MOUTH FOUR TIMES DAILY 10/10/19   Lovorn, Aundra Millet, MD  Polyethylene Glycol 3350 (MIRALAX PO) Take 17 g by mouth daily as needed (constipation).     [provider]  prazosin (MINIPRESS) 5 MG capsule Take 5 mg by mouth at bedtime. 04/23/19   [provider]  pregabalin (LYRICA) 150 MG capsule Take 1 capsule (150 mg total) by mouth 2 (two) times daily. 07/17/19 07/16/20  Lovorn, Aundra Millet, MD  promethazine (PHENERGAN) 12.5 MG tablet Take 1 tablet (12.5  mg total) by mouth every 6 (six) hours as needed for nausea or vomiting. 06/19/19   Lovorn, Aundra Millet, MD  promethazine (PHENERGAN) 25 MG tablet Take 25 mg by mouth every 8 (eight) hours as needed for nausea or vomiting.  11/27/19   [provider]  promethazine (PHENERGAN) 6.25 MG/5ML syrup TAKE TWO TEASPOONSFUL ( ) BY MOUTH FOUR TIMES DAILY AS NEEDED FOR NAUSEA AND VOMITING. 02/18/20   Lovorn, Aundra Millet, MD  SUMAtriptan (IMITREX) 100 MG tablet Take 1 tablet (100 mg total) by mouth every 2 (two) hours as needed for migraine. May repeat in 2 hours if headache persists or recurs. 04/08/19   Lovorn, Aundra Millet, MD  valACYclovir (VALTREX) 1000 MG tablet Take 1 tablet (1,000 mg total) by mouth 3 (three) times daily. 03/31/19   Eustace Moore, MD  venlafaxine (EFFEXOR) 37.5 MG tablet Take 37.5 mg by mouth every morning. 02/20/20   [provider]  zolpidem (AMBIEN) 5 MG tablet TAKE ONE TABLET BY MOUTH AT BEDTIME AS NEEDED FOR SLEEP Patient not taking: Reported on 03/20/2020 03/06/20   Genice Rouge, MD    Allergies    Metoclopramide, Tomato, Aspirin, Doxycycline, Hydromorphone hcl, Ibuprofen, Ondansetron, Other, Shellfish allergy, Toradol [ketorolac tromethamine], Latex, and Tramadol  Review of Systems   Review of Systems  Constitutional: Positive for fatigue. Negative for chills and fever.  HENT: Negative for ear pain and sore throat.    Eyes: Negative for pain and visual disturbance.  Respiratory: Negative for cough and shortness of breath.   Cardiovascular: Negative for chest pain and palpitations.  Gastrointestinal: Positive for abdominal pain, constipation, nausea and vomiting.  Genitourinary: Negative for dysuria and hematuria.  Musculoskeletal: Negative for arthralgias and back pain.  Skin: Negative for color change and rash.  Neurological: Negative for seizures and syncope.  All other systems reviewed and are negative.   Physical Exam Updated Vital Signs BP 133/88 (BP Location: Left Arm)   Pulse 90   Temp 98.6 F (37 C) (Oral)   Resp 15   LMP 12/04/2018   SpO2 100%   Physical Exam Vitals and nursing note reviewed.  Constitutional:      General: She is not in acute distress.    Appearance: She is well-developed.  HENT:     Head: Normocephalic and atraumatic.  Eyes:     Conjunctiva/sclera: Conjunctivae normal.  Cardiovascular:     Rate and Rhythm: Normal rate and regular rhythm.     Heart sounds: No murmur heard.   Pulmonary:     Effort: Pulmonary effort is normal. No respiratory distress.     Breath sounds: Normal breath sounds.  Abdominal:     Palpations: Abdomen is soft.     Tenderness: There is no abdominal tenderness.  Musculoskeletal:     Cervical back: Neck supple.  Skin:    General: Skin is warm and dry.  Neurological:     General: No focal deficit present.     Mental Status: She is alert.  Psychiatric:        Mood and Affect: Mood normal.     ED Results / Procedures / Treatments   Labs (all labs ordered are listed, but only abnormal results are displayed) Labs Reviewed  RETICULOCYTES - Abnormal; Notable for the following components:      Result Value   Immature Retic Fract 26.9 (*)    All other components within normal limits  CBC WITH DIFFERENTIAL/PLATELET - Abnormal; Notable for the following components:   WBC 14.1 (*)    Neutro  Abs 7.8 (*)    Lymphs Abs 4.9 (*)     All other components within normal limits  COMPREHENSIVE METABOLIC PANEL - Abnormal; Notable for the following components:   CO2 20 (*)    All other components within normal limits  URINALYSIS, ROUTINE W REFLEX MICROSCOPIC - Abnormal; Notable for the following components:   Hgb urine dipstick SMALL (*)    All other components within normal limits  I-STAT BETA HCG BLOOD, ED ([facility], [facility], [facility] ONLY) - Abnormal; Notable for the following components:   I-stat hCG, quantitative 11.2 (*)    All other components within normal limits  LIPASE, BLOOD  CBC WITH DIFFERENTIAL/PLATELET    EKG None  Radiology CT ABDOMEN PELVIS WO CONTRAST  Result Date: 04/18/2020 CLINICAL DATA:  35 year old female with abdominal pain. EXAM: CT ABDOMEN AND PELVIS WITHOUT CONTRAST TECHNIQUE: Multidetector CT imaging of the abdomen and pelvis was performed following the standard protocol without IV contrast. COMPARISON:  CT abdomen pelvis dated 06/01/2019. FINDINGS: Evaluation of this exam is limited in the absence of intravenous contrast. Lower chest: The visualized lung bases are clear. No intra-abdominal free air or free fluid. Hepatobiliary: No focal liver abnormality is seen. No gallstones, gallbladder wall thickening, or biliary dilatation. Pancreas: Unremarkable. No pancreatic ductal dilatation or surrounding inflammatory changes. Spleen: Normal in size without focal abnormality. Adrenals/Urinary Tract: The adrenal glands unremarkable. The kidneys, visualized ureters, and urinary bladder appear unremarkable. Stomach/Bowel: There is moderate stool throughout the colon. There is no bowel obstruction or active inflammation. The appendix is normal. Vascular/Lymphatic: The abdominal aorta and IVC are grossly unremarkable on this noncontrast CT. No portal venous gas. There is no adenopathy. Reproductive: Hysterectomy.  No adnexal masses. Other: None Musculoskeletal: No acute or significant osseous findings. IMPRESSION: 1. No acute  intra-abdominal or pelvic pathology. 2. Moderate colonic stool burden. No bowel obstruction. Normal appendix. Electronically Signed   By: Elgie Collard M.D.   On: 04/18/2020 22:38    Procedures Procedures   Medications Ordered in ED Medications  HYDROmorphone (DILAUDID) injection 1 mg (1 mg Intravenous Given 04/18/20 2123)  diphenhydrAMINE (BENADRYL) injection 12.5 mg (12.5 mg Intravenous Given 04/18/20 2123)  promethazine (PHENERGAN) 12.5 mg in sodium chloride 0.9 % 50 mL infusion (0 mg Intravenous Stopped 04/18/20 2202)  acetaminophen (TYLENOL) tablet 650 mg (650 mg Oral Given 04/18/20 2222)  mineral oil enema 1 enema (1 enema Rectal Given 04/18/20 2347)    ED Course  I have reviewed the triage vital signs and the nursing notes.  Pertinent labs & imaging results that were available during my care of the patient were reviewed by me and considered in my medical decision making (see chart for details).    MDM Rules/Calculators/A&P                         35 year old lady presents to ER with concern for acute on chronic abdominal pain as well as constipation.  On physical exam patient appears quite well, her abdomen is soft.  Vital signs are stable.  Basic labs noted for mild leukocytosis but otherwise normal.  CT scan negative for acute pathology, does demonstrate moderate stool burden.  Provided enema.  Symptoms well controlled at present.  Follow-up with primary doctor and gastroenterology.  After the discussed management above, the patient was determined to be safe for discharge.  The patient was in agreement with this plan and all questions regarding their care were answered.  ED return precautions were discussed and  the patient will return to the ED with any significant worsening of condition.    Final Clinical Impression(s) / ED Diagnoses Final diagnoses:  Abdominal pain, unspecified abdominal location    Rx / DC Orders ED Discharge Orders         Ordered    senna-docusate  (SENOKOT-S) 8.6-50 MG tablet  Daily        04/18/20 2246           Milagros Loll, MD 04/19/20 1338

## 2020-04-18 NOTE — Discharge Instructions (Addendum)
Recommend following up with your primary care doctor and a gastroenterologist.  In addition to the daily MiraLAX, recommend taking prescribed senakot.  If you develop fever, uncontrolled pain, vomiting or other new concerning symptom, return to ER for reassessment.

## 2020-04-18 NOTE — ED Notes (Signed)
Pt very difficult stick Attempted finger stick for CMP If IV is ordered, RN will need to collect CBC/Retic and extra tubes if possible

## 2020-04-18 NOTE — ED Notes (Signed)
Patient asking for more pain medicine. Dr. Stevie Kern notified.

## 2020-04-18 NOTE — ED Notes (Signed)
2 RN attempted to start IV for patient. IV ultrasound used for 2x. Unsuccessful.

## 2020-04-18 NOTE — ED Triage Notes (Signed)
Pt reports SCC pain in back since Thursday. Pt also reports no BM since 3/17. Pt denies relief with home medications.

## 2020-04-18 NOTE — ED Notes (Signed)
Per Pam@ Lab CBC w/diff canceled d/t not receiving the tube. RN advised. [employer]

## 2020-04-20 ENCOUNTER — Other Ambulatory Visit: Payer: Self-pay

## 2020-04-20 ENCOUNTER — Ambulatory Visit (HOSPITAL_COMMUNITY): Payer: Medicaid Other | Attending: Family Medicine | Admitting: Physical Therapy

## 2020-04-20 ENCOUNTER — Encounter (HOSPITAL_COMMUNITY): Payer: Self-pay | Admitting: Physical Therapy

## 2020-04-20 DIAGNOSIS — M6281 Muscle weakness (generalized): Secondary | ICD-10-CM | POA: Diagnosis present

## 2020-04-20 DIAGNOSIS — R52 Pain, unspecified: Secondary | ICD-10-CM | POA: Diagnosis present

## 2020-04-20 DIAGNOSIS — R262 Difficulty in walking, not elsewhere classified: Secondary | ICD-10-CM | POA: Diagnosis not present

## 2020-04-20 NOTE — Telephone Encounter (Signed)
Pt cancelled appt due to the weather

## 2020-04-20 NOTE — Therapy (Signed)
[facility] [address] [place], [place], [address] Phone: [phone]   Fax:  [phone]  Physical Therapy Treatment  Patient Details  Name: Tanya Krause MRN: [id number] Date of Birth: 11/28/1985 Referring Provider (PT): Verlon Au, MS   Encounter Date: 04/20/2020   PT End of Session - 04/20/20 1741    Visit Number 5    Number of Visits 12    Date for PT Re-Evaluation 05/13/20    Authorization Type medicaid [employer] no auth for adults, received order to begin OT -  may have to split visits    Authorization - Visit Number 4    Authorization - Number of Visits 27    PT Start Time 1305    PT Stop Time 1355    PT Time Calculation (min) 50 min    Equipment Utilized During Treatment Back brace    Activity Tolerance No increased pain;Patient limited by fatigue;Patient limited by lethargy    Behavior During Therapy [facility] for tasks assessed/performed           Past Medical History:  Diagnosis Date  . Asthma   . Chronic abdominal pain   . Chronic migraine   . Chronic pain syndrome   . Fibromyalgia   . 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 Hypertension   . Migraine   . Pelvic inflammatory disease (PID)     Past Surgical History:  Procedure Laterality Date  . HYSTERECTOMY ABDOMINAL WITH SALPINGO-OOPHORECTOMY Bilateral 02/06/2019   Procedure: HYSTERECTOMY ABDOMINAL WITH BILATERAL SALPINGO-OOPHORECTOMY;  Surgeon: Lazaro Arms, MD;  Location: [facility];  Service: Gynecology;  Laterality: Bilateral;  . kidney infections    . TUBAL LIGATION    . WISDOM TOOTH EXTRACTION      There were no vitals filed for this visit.   Subjective Assessment - 04/20/20 1738    Subjective Patient says she has been unable to attend therapy the past few weeks due to medical issues she was having. She was DC from the hospital yesterday for some stomach troubles. Her son had covid a few weeks ago, and she was dealing with that also.    Pertinent History  scoliosis, fibromyalgia, RA, myofascial pain syndrome    Patient Stated Goals to be able to function better    Currently in Pain? Yes    Pain Score 8     Pain Location Back    Pain Orientation Posterior;Lower    Pain Descriptors / Indicators Aching;Sharp;Constant;Throbbing    Pain Type Chronic pain    Pain Onset More than a month ago    Pain Frequency Constant                         Adult Aquatic Therapy - 04/20/20 1746      Treatment   Exercises marching x15, heel raise x15, toe raise x10, hip extension x15 ea, hip flexion x 10 each, hip abduction x 15 ea, mini squat with pool noodle x 10, knee flexion x 15 each, lunge stretch 5 x10 each, tandem stance with pool noodle 2 x 15", lumbar traction using pool noodle 3'                        PT Short Term Goals - 03/18/20 3151      PT SHORT TERM GOAL #1   Title Patient will be independent in self management strategies to improve quality of life and  functional outcomes.    Time 4    Period Weeks    Status New    Target Date 04/15/20      PT SHORT TERM GOAL #2   Title Patient will report at least 25% improvement in overall symptoms and/or function to demonstrate improved functional mobility    Time 4    Period Weeks    Status New    Target Date 04/15/20      PT SHORT TERM GOAL #3   Title Patient will be able to walk at least 75 feet with RW in to demonstrate improved walking endurance    Time 4    Period Weeks    Status New    Target Date 04/15/20      PT SHORT TERM GOAL #4   Title Patient will be able to demonstrate at least 2 sit to stands in 30 seconds sit to stand test with use of one hand as needed    Time 4    Period Weeks    Status New    Target Date 04/15/20             PT Long Term Goals - 03/18/20 0951      PT LONG TERM GOAL #1   Title Patient will be able to demonstrate at least 4 sit to stands in 30 seconds sit to stand test with use of one hand as needed    Time 8     Period Weeks    Status New    Target Date 05/13/20      PT LONG TERM GOAL #2   Title Patient will be able to walk at least 125 feet with RW in to demonstrate improved walking endurance    Time 8    Period Weeks    Status New    Target Date 05/13/20      PT LONG TERM GOAL #3   Title Patient will report at least 50% improvement in overall symptoms and/or function to demonstrate improved functional mobility    Time 8    Period Weeks    Status New    Target Date 05/13/20                 Plan - 04/20/20 1741    Clinical Impression Statement Initiated aquatic therapy. Patient tolerated this fairly well overall. Began with stationary warmup up, then progressed gentle LE mobile, and strengthening. Added suspended lumbar traction for pain. Patient noted improved mobility in gravity reduced setting, increased difficulty walking once out of pool. Patient educated on proper form and function of all added exercise today. Patient did require frequent verbal cues for proper form with LE strength exercise. She is limited in LT knee flexion with hamstring curls. Patient will continue to benefit from skilled therapy services to reduce limitation and improve functional ability.    Personal Factors and Comorbidities Comorbidity 1;Comorbidity 2;Comorbidity 3+    Comorbidities fibromyalgia, RA, myofascial pain syndrome    Examination-Activity Limitations Bed Mobility;Lift;Locomotion Level;Transfers;Stand;Squat;Sit;Bathing;Bend;Carry    Examination-Participation Restrictions Meal Prep;Community Activity;Cleaning;Shop    Stability/Clinical Decision Making Evolving/Moderate complexity    Rehab Potential Fair    PT Frequency --   1-2x/week for total of 12 visits over 8 week certification period   PT Duration 8 weeks    PT Treatment/Interventions ADLs/Self Care Home Management;Aquatic Therapy;Cryotherapy;Electrical Stimulation;Moist Heat;Traction;Balance training;Therapeutic exercise;Therapeutic  activities;Functional mobility training;Stair training;Gait training;DME Instruction;Neuromuscular re-education;Patient/family education;Manual techniques;Passive range of motion    PT Next Visit Plan functional  strength, walking, pain management strategies, aquatics. Add step ups    PT Home Exercise Plan LAQs, hip add isometrics seated, marching    Consulted and Agree with Plan of Care Patient           Patient will benefit from skilled therapeutic intervention in order to improve the following deficits and impairments:  Decreased endurance,Decreased activity tolerance,Decreased knowledge of use of DME,Decreased strength,Pain,Difficulty walking,Decreased mobility,Decreased balance,Decreased range of motion  Visit Diagnosis: Difficulty in walking, not elsewhere classified  Muscle weakness (generalized)  Generalized pain     Problem List Patient Active Problem List   Diagnosis Date Noted  . Insomnia due to medical condition 07/17/2019  . Encounter for long-term use of opiate analgesic 04/08/2019  . Encounter for medication monitoring 04/08/2019  . 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 Trochanteric bursitis of left hip 03/11/2019  . S/P hysterectomy 02/06/2019  . S/P TAH-BSO (total abdominal hysterectomy and bilateral salpingo-oophorectomy) 02/06/2019  . Myofascial pain dysfunction syndrome 09/27/2018  . Chronic bilateral back pain 09/03/2018  . Colitis 08/12/2015  . Oral thrush 11/18/2014  . B12 deficiency 11/17/2014  . Chronic pain syndrome 11/17/2014  . Numbness 11/17/2014  . Intractable headache 11/17/2014  . Intractable chronic migraine without aura and without status migrainosus 11/17/2014  . Migraines 10/11/2014  . Primary fibromyalgia syndrome 10/11/2014  . Lower extremity weakness 10/11/2014  . Vitamin B12 deficiency 10/11/2014  . Anxiety state 10/11/2014  . Nausea & vomiting 10/08/2014  .  Headache, tension type, chronic 09/07/2012  . Abdominal pain 06/15/2012   6:00 PM, 04/20/20 Georges Lynch PT DPT  Physical Therapist with [facility]  [facility]  [phone]   [facility] [address] [place], [place], [address] Phone: [phone]   Fax:  [phone]  Name: Tanya Krause MRN: [id number] Date of Birth: 06/24/1985

## 2020-04-23 ENCOUNTER — Encounter (HOSPITAL_COMMUNITY): Payer: Self-pay

## 2020-04-23 ENCOUNTER — Ambulatory Visit (HOSPITAL_COMMUNITY): Payer: Medicaid Other

## 2020-04-23 ENCOUNTER — Other Ambulatory Visit: Payer: Self-pay

## 2020-04-23 DIAGNOSIS — R52 Pain, unspecified: Secondary | ICD-10-CM

## 2020-04-23 DIAGNOSIS — M6281 Muscle weakness (generalized): Secondary | ICD-10-CM

## 2020-04-23 DIAGNOSIS — R262 Difficulty in walking, not elsewhere classified: Secondary | ICD-10-CM

## 2020-04-23 NOTE — Therapy (Signed)
[facility] [address] [place], [place], [address] Phone: [phone]   Fax:  [phone]  Physical Therapy Treatment  Patient Details  Name: Tanya Krause MRN: [id number] Date of Birth: [birth year] Referring Provider (PT): Francene Castle for pain management; Verlon Au, MS   Encounter Date: 04/23/2020   PT End of Session - 04/23/20 1137    Visit Number 6    Number of Visits 12    Date for PT Re-Evaluation 05/13/20    Authorization Type medicaid [employer] no auth for adults, received order to begin OT -  may have to split visits    Authorization - Visit Number 5    Authorization - Number of Visits 27    Progress Note Due on Visit 10    PT Start Time 1134    PT Stop Time 1214    PT Time Calculation (min) 40 min    Equipment Utilized During Treatment --   SBA for safety.  No back brace wore through session.   Activity Tolerance No increased pain;Patient limited by fatigue;Patient limited by lethargy    Behavior During Therapy [facility] for tasks assessed/performed           Past Medical History:  Diagnosis Date  . Asthma   . Chronic abdominal pain   . Chronic migraine   . Chronic pain syndrome   . Fibromyalgia   . 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 Hypertension   . Migraine   . Pelvic inflammatory disease (PID)     Past Surgical History:  Procedure Laterality Date  . HYSTERECTOMY ABDOMINAL WITH SALPINGO-OOPHORECTOMY Bilateral 02/06/2019   Procedure: HYSTERECTOMY ABDOMINAL WITH BILATERAL SALPINGO-OOPHORECTOMY;  Surgeon: Lazaro Arms, MD;  Location: [facility];  Service: Gynecology;  Laterality: Bilateral;  . kidney infections    . TUBAL LIGATION    . WISDOM TOOTH EXTRACTION      There were no vitals filed for this visit.   Subjective Assessment - 04/23/20 1135    Subjective Pt reoprts she has had a lot of close calls with Lt LE giving out on her.  Reports pain LBP and BLE pain scale 7/10.  Reports her tremors are bad today.     Pertinent History scoliosis, fibromyalgia, RA, myofascial pain syndrome    Patient Stated Goals to be able to function better    Currently in Pain? Yes    Pain Score 7     Pain Location Back   LBP and BLE legs   Pain Descriptors / Indicators Aching;Throbbing;Stabbing;Constant;Dull    Pain Type Chronic pain    Pain Onset More than a month ago    Pain Frequency Constant    Aggravating Factors  movement    Pain Relieving Factors none              [facility] PT Assessment - 04/23/20 0001      Assessment   Medical Diagnosis chronic pain    Referring Provider (PT) Francene Castle for pain management; Verlon Au, MS    Next MD Visit 04/24/20 shot for pain control                         [facility] Adult PT Treatment/Exercise - 04/23/20 0001      Knee/Hip Exercises: Standing   Heel Raises Both;2 sets;5 reps    Heel Raises Limitations heavy UE support    Hip Flexion Both;2 sets;5 reps;Knee bent    Hip  Flexion Limitations toe tapping alternating on 4in step, BUE heavy support    Hip Abduction Both;2 sets;5 reps;Knee straight    Abduction Limitations BUE Support    Hip Extension 5 reps;Both    Forward Step Up Both;5 reps;Hand Hold: 2;Step Height: 4"      Knee/Hip Exercises: Seated   Long Arc Quad Both;10 reps    Long Arc Quad Limitations lacking full range                  PT Education - 04/23/20 1204    Education Details Educated benefits with compression garments for edema control, butler for assistance.  Measurements complete and paperwork given to [employer]    Methods Explanation;Demonstration    Comprehension Verbalized understanding            PT Short Term Goals - 03/18/20 0951      PT SHORT TERM GOAL #1   Title Patient will be independent in self management strategies to improve quality of life and functional outcomes.    Time 4    Period Weeks    Status New    Target Date 04/15/20      PT SHORT TERM GOAL #2   Title Patient will  report at least 25% improvement in overall symptoms and/or function to demonstrate improved functional mobility    Time 4    Period Weeks    Status New    Target Date 04/15/20      PT SHORT TERM GOAL #3   Title Patient will be able to walk at least 75 feet with RW in to demonstrate improved walking endurance    Time 4    Period Weeks    Status New    Target Date 04/15/20      PT SHORT TERM GOAL #4   Title Patient will be able to demonstrate at least 2 sit to stands in 30 seconds sit to stand test with use of one hand as needed    Time 4    Period Weeks    Status New    Target Date 04/15/20             PT Long Term Goals - 03/18/20 0951      PT LONG TERM GOAL #1   Title Patient will be able to demonstrate at least 4 sit to stands in 30 seconds sit to stand test with use of one hand as needed    Time 8    Period Weeks    Status New    Target Date 05/13/20      PT LONG TERM GOAL #2   Title Patient will be able to walk at least 125 feet with RW in to demonstrate improved walking endurance    Time 8    Period Weeks    Status New    Target Date 05/13/20      PT LONG TERM GOAL #3   Title Patient will report at least 50% improvement in overall symptoms and/or function to demonstrate improved functional mobility    Time 8    Period Weeks    Status New    Target Date 05/13/20                 Plan - 04/23/20 1353    Clinical Impression Statement Pt presents with increased tremers through session, SBA for safety.  SEssion focus wtih LE strengthening.  Pt limited by pain and weakness with increased fatigue during standing  exercises required periodic seated rest breaks.  Pt c/o edema present BLE and arms.  Educated benefits with compression garments for edema control.  Measurements taken and handout given for thigh high garments at [employer]  Show bulter for assistance.  Pt c/o back tightness during additional hip extension exercise.  Also added step  ups for functional strengthening.  No reports of increased pain, was limited by fatigue.    Personal Factors and Comorbidities Comorbidity 1;Comorbidity 2;Comorbidity 3+    Comorbidities fibromyalgia, RA, myofascial pain syndrome    Examination-Activity Limitations Bed Mobility;Lift;Locomotion Level;Transfers;Stand;Squat;Sit;Bathing;Bend;Carry    Examination-Participation Restrictions Meal Prep;Community Activity;Cleaning;Shop    Stability/Clinical Decision Making Evolving/Moderate complexity    Clinical Decision Making Moderate    Rehab Potential Fair    PT Frequency --   1-2x/week for total of 12 visits over 8 week certification period   PT Duration 8 weeks    PT Treatment/Interventions ADLs/Self Care Home Management;Aquatic Therapy;Cryotherapy;Electrical Stimulation;Moist Heat;Traction;Balance training;Therapeutic exercise;Therapeutic activities;Functional mobility training;Stair training;Gait training;DME Instruction;Neuromuscular re-education;Patient/family education;Manual techniques;Passive range of motion    PT Next Visit Plan Trial with yoga exercises for core and proximal strengthening and mobility next session.  (quaduped UE/LE extension, child's pose, static balance...)  Continue functional strength, walking, pain management strategies, aquatics.    PT Home Exercise Plan LAQs, hip add isometrics seated, marching    Consulted and Agree with Plan of Care Patient           Patient will benefit from skilled therapeutic intervention in order to improve the following deficits and impairments:  Decreased endurance,Decreased activity tolerance,Decreased knowledge of use of DME,Decreased strength,Pain,Difficulty walking,Decreased mobility,Decreased balance,Decreased range of motion  Visit Diagnosis: Muscle weakness (generalized)  Generalized pain  Difficulty in walking, not elsewhere classified     Problem List Patient Active Problem List   Diagnosis Date Noted  . Insomnia due to  medical condition 07/17/2019  . Encounter for long-term use of opiate analgesic 04/08/2019  . Encounter for medication monitoring 04/08/2019  . 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 Trochanteric bursitis of left hip 03/11/2019  . S/P hysterectomy 02/06/2019  . S/P TAH-BSO (total abdominal hysterectomy and bilateral salpingo-oophorectomy) 02/06/2019  . Myofascial pain dysfunction syndrome 09/27/2018  . Chronic bilateral back pain 09/03/2018  . Colitis 08/12/2015  . Oral thrush 11/18/2014  . B12 deficiency 11/17/2014  . Chronic pain syndrome 11/17/2014  . Numbness 11/17/2014  . Intractable headache 11/17/2014  . Intractable chronic migraine without aura and without status migrainosus 11/17/2014  . Migraines 10/11/2014  . Primary fibromyalgia syndrome 10/11/2014  . Lower extremity weakness 10/11/2014  . Vitamin B12 deficiency 10/11/2014  . Anxiety state 10/11/2014  . Nausea & vomiting 10/08/2014  . Headache, tension type, chronic 09/07/2012  . Abdominal pain 06/15/2012   Becky Sax, LPTA/CLT; CBIS [phone]  Juel Burrow 04/23/2020, 2:01 PM  [facility] [address] [place], [place], [address] Phone: [phone]   Fax:  [phone]  Name: SHABANA ARMENTROUT MRN: [id number] Date of Birth: 07/12/1985

## 2020-04-24 ENCOUNTER — Encounter: Payer: Self-pay | Admitting: Physical Medicine and Rehabilitation

## 2020-04-24 ENCOUNTER — Encounter
Payer: Medicaid Other | Attending: Physical Medicine and Rehabilitation | Admitting: Physical Medicine and Rehabilitation

## 2020-04-24 VITALS — BP 145/96 | HR 101 | Temp 98.6°F | Ht 60.0 in | Wt 175.8 lb

## 2020-04-24 DIAGNOSIS — G43519 Persistent migraine aura without cerebral infarction, intractable, without status migrainosus: Secondary | ICD-10-CM

## 2020-04-24 DIAGNOSIS — G894 Chronic pain syndrome: Secondary | ICD-10-CM | POA: Diagnosis present

## 2020-04-24 DIAGNOSIS — M7918 Myalgia, other site: Secondary | ICD-10-CM | POA: Diagnosis present

## 2020-04-24 DIAGNOSIS — M797 Fibromyalgia: Secondary | ICD-10-CM

## 2020-04-24 DIAGNOSIS — R5383 Other fatigue: Secondary | ICD-10-CM | POA: Diagnosis not present

## 2020-04-24 MED ORDER — PANTOPRAZOLE SODIUM 40 MG PO TBEC: 40.0000 mg | DELAYED_RELEASE_TABLET | Freq: Every day | ORAL | 5 refills | Status: DC

## 2020-04-24 MED ORDER — EMGALITY 120 MG/ML ~~LOC~~ SOAJ: 1.0000 | SUBCUTANEOUS | 11 refills | Status: DC

## 2020-04-24 MED ORDER — HYDROCODONE-ACETAMINOPHEN 5-325 MG PO TABS: 1.0000 | ORAL_TABLET | Freq: Three times a day (TID) | ORAL | 0 refills | Status: DC | PRN
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
## 2020-04-24 NOTE — Patient Instructions (Signed)
Patient is is 35yr old female with hx of chronic pain syndrome- with myofascialpain syndrome, fibromyalgia- Also has sickle cell disease.  Severe constipation and GERD daily; also frequent migraines- intractable.   1. Discussed how therapy in her case is especially important since has gotten weak, but hurts and then doing less for so long- will really hurt as she builds back up and it's a balance to not overdo, but progress- it will take TIME and energy.   2. Magnesium citrate- drink Mg 1x/week- and follow with Fleet's enema. 3-4 hours later after Mg citrate- guzzle Mg citrate.  Senokot-S- (generic) is laxative with stool softener- 2 tabs 2x/day initially and max dose of 2 tabs 3x/day.   3. If this doesn't work, will try Linzess -    4. Zofran can cause constipation, FYI- stick with phenergan.   5. Due to heart burn , will add Protoprazole/Protonix 40 mg daily- for heartburn/reflux symptoms.   6. Patient here for trigger point injections for myofascial pain.   Consent done and on chart.  Cleaned areas with alcohol and injected using a 27 gauge 1.5 inch needle  Injected 6cc Using 1% Lidocaine with no EPI  Upper traps  B/L Levators B/L  Posterior scalenes B/L  Middle scalenes Splenius Capitus B/L  Pectoralis Major B/L  Rhomboids B/L Infraspinatus Teres Major/minor Thoracic paraspinals Lumbar paraspinals Other injections-  B/L 1st dorsal interossei and B/L extensor in forearms near elbows B/L    Patient's level of pain prior was Current level of pain after injections is  There was no bleeding or complications.  Patient was advised to drink a lot of water on day after injections to flush system Will have increased soreness for 12-48 hours after injections.  Can use Lidocaine patches the day AFTER injections Can use theracane on day of injections in places didn't inject Can use heating pad 4-6 hours AFTER injections   6. Will check TSH, because more lethargic, gaining  weight, and losing hair, and skin dry- think could have hypothyroidism. Thyroid disease can make myofascial pain worse as well.  Will treat if needed.   7. Renew Norco a little early- but can pick up 4/17 since I will be off that weak.   8. Off Lyrica, Cymbalta- and on Ambien for slepe- has tried Trazodone/Lunesta and Prazosin- hasn't helped She is unable to obtain relief from these symptoms with any medications she has tried. Did Benadryl; didn't help.   9. Went over [employer] - is MONTHLY- is scheduled 1x/month.   10. 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   11. F/U in 4-6 weeks for f/u and trigger point injections.

## 2020-04-25 LAB — TSH: TSH: 1.32 u[IU]/mL (ref 0.450–4.500)

## 2020-04-27 ENCOUNTER — Other Ambulatory Visit: Payer: Self-pay

## 2020-04-27 ENCOUNTER — Ambulatory Visit (HOSPITAL_COMMUNITY): Payer: Medicaid Other | Admitting: Physical Therapy

## 2020-04-27 ENCOUNTER — Telehealth: Payer: Self-pay | Admitting: *Deleted

## 2020-04-27 DIAGNOSIS — R52 Pain, unspecified: Secondary | ICD-10-CM

## 2020-04-27 DIAGNOSIS — R262 Difficulty in walking, not elsewhere classified: Secondary | ICD-10-CM

## 2020-04-27 DIAGNOSIS — M6281 Muscle weakness (generalized): Secondary | ICD-10-CM

## 2020-04-27 NOTE — Telephone Encounter (Signed)
Prior auth for Diclofenac 50 mg tablets DR was initiated and denied via CoverMyMeds. Denied on April 8 Request Reference Number: [id number]. DICLOFENAC TAB 50MG  DR is denied for not meeting the prior authorization requirement(s).

## 2020-04-28 ENCOUNTER — Encounter (HOSPITAL_COMMUNITY): Payer: Self-pay | Admitting: Physical Therapy

## 2020-04-28 NOTE — Therapy (Signed)
[facility] [address] [place], [place], [address] Phone: [phone]   Fax:  [phone]  Physical Therapy Treatment  Patient Details  Name: Tanya Krause MRN: [id number] Date of Birth: 1985-12-21 Referring Provider (PT): Francene Castle for pain management; Verlon Au, MS   Encounter Date: 04/27/2020   PT End of Session - 04/28/20 1505    Visit Number 7    Number of Visits 12    Date for PT Re-Evaluation 05/13/20    Authorization Type medicaid UHC no auth for adults, received order to begin OT -  may have to split visits    Authorization - Visit Number 6    Authorization - Number of Visits 27    Progress Note Due on Visit 10    PT Start Time 1315    PT Stop Time 1405    PT Time Calculation (min) 50 min    Equipment Utilized During Treatment    Activity Tolerance Patient limited by fatigue;Patient tolerated treatment well    Behavior During Therapy [facility] for tasks assessed/performed           Past Medical History:  Diagnosis Date  . Asthma   . Chronic abdominal pain   . Chronic migraine   . Chronic pain syndrome   . Fibromyalgia   . 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 Hypertension   . Migraine   . Pelvic inflammatory disease (PID)     Past Surgical History:  Procedure Laterality Date  . HYSTERECTOMY ABDOMINAL WITH SALPINGO-OOPHORECTOMY Bilateral 02/06/2019   Procedure: HYSTERECTOMY ABDOMINAL WITH BILATERAL SALPINGO-OOPHORECTOMY;  Surgeon: Lazaro Arms, MD;  Location: [facility];  Service: Gynecology;  Laterality: Bilateral;  . kidney infections    . TUBAL LIGATION    . WISDOM TOOTH EXTRACTION      There were no vitals filed for this visit.   Subjective Assessment - 04/28/20 1503    Subjective Patient states she had trigger point injections yesterday. Says this has been helpful. Less pain today. Walking a little more.    Pertinent History scoliosis, fibromyalgia, RA, myofascial pain syndrome    Patient Stated  Goals to be able to function better    Currently in Pain? Yes    Pain Score 7     Pain Location Back    Pain Orientation Posterior    Pain Descriptors / Indicators Aching;Stabbing;Throbbing    Pain Type Chronic pain    Pain Onset More than a month ago    Pain Frequency Constant    Multiple Pain Sites Yes    Pain Score 7    Pain Location Leg    Pain Orientation Right;Left    Pain Descriptors / Indicators Aching;Stabbing;Throbbing    Pain Type Chronic pain    Pain Onset More than a month ago    Pain Frequency Constant                         Adult Aquatic Therapy - 04/28/20 1509      Treatment   Exercises marching x20, heel raise x20, hip extension x20 ea, hip abduction x 20 ea, seated LAQs x20 each, calf strecth at wall 3 x 20", mini squat  x 10, knee flexion x 15 each, standng balance 3 x 20", tandem stance 3 x 20"                        PT Short  Term Goals - 03/18/20 0951      PT SHORT TERM GOAL #1   Title Patient will be independent in self management strategies to improve quality of life and functional outcomes.    Time 4    Period Weeks    Status New    Target Date 04/15/20      PT SHORT TERM GOAL #2   Title Patient will report at least 25% improvement in overall symptoms and/or function to demonstrate improved functional mobility    Time 4    Period Weeks    Status New    Target Date 04/15/20      PT SHORT TERM GOAL #3   Title Patient will be able to walk at least 75 feet with RW in to demonstrate improved walking endurance    Time 4    Period Weeks    Status New    Target Date 04/15/20      PT SHORT TERM GOAL #4   Title Patient will be able to demonstrate at least 2 sit to stands in 30 seconds sit to stand test with use of one hand as needed    Time 4    Period Weeks    Status New    Target Date 04/15/20             PT Long Term Goals - 03/18/20 0951      PT LONG TERM GOAL #1   Title Patient will be able to  demonstrate at least 4 sit to stands in 30 seconds sit to stand test with use of one hand as needed    Time 8    Period Weeks    Status New    Target Date 05/13/20      PT LONG TERM GOAL #2   Title Patient will be able to walk at least 125 feet with RW in to demonstrate improved walking endurance    Time 8    Period Weeks    Status New    Target Date 05/13/20      PT LONG TERM GOAL #3   Title Patient will report at least 50% improvement in overall symptoms and/or function to demonstrate improved functional mobility    Time 8    Period Weeks    Status New    Target Date 05/13/20                 Plan - 04/28/20 1506    Clinical Impression Statement Patient tolerated session well overall and shows some improvement in activity tolerance. Patient shows some improvement in gait speed and general movement quality. Patient continues to be limited by pain and increased fatigue with activity. Patient required verbal cues for form and ROM for mini squats. Patient notes increased knee pain. Patient will continue to benefit from skilled therapy services to reduce limitation and improve functional ability.    Personal Factors and Comorbidities Comorbidity 1;Comorbidity 2;Comorbidity 3+    Comorbidities fibromyalgia, RA, myofascial pain syndrome    Examination-Activity Limitations Bed Mobility;Lift;Locomotion Level;Transfers;Stand;Squat;Sit;Bathing;Bend;Carry    Examination-Participation Restrictions Meal Prep;Community Activity;Cleaning;Shop    Stability/Clinical Decision Making Evolving/Moderate complexity    Rehab Potential Fair    PT Frequency --   1-2x/week for total of 12 visits over 8 week certification period   PT Duration 8 weeks    PT Treatment/Interventions ADLs/Self Care Home Management;Aquatic Therapy;Cryotherapy;Electrical Stimulation;Moist Heat;Traction;Balance training;Therapeutic exercise;Therapeutic activities;Functional mobility training;Stair training;Gait training;DME  Instruction;Neuromuscular re-education;Patient/family education;Manual techniques;Passive range of motion  PT Next Visit Plan Trial with yoga exercises for core and proximal strengthening and mobility next session.  (quaduped UE/LE extension, child's pose, static balance...)  Continue functional strength, walking, pain management strategies, aquatics.    PT Home Exercise Plan LAQs, hip add isometrics seated, marching    Consulted and Agree with Plan of Care Patient           Patient will benefit from skilled therapeutic intervention in order to improve the following deficits and impairments:  Decreased endurance,Decreased activity tolerance,Decreased knowledge of use of DME,Decreased strength,Pain,Difficulty walking,Decreased mobility,Decreased balance,Decreased range of motion  Visit Diagnosis: Muscle weakness (generalized)  Generalized pain  Difficulty in walking, not elsewhere classified     Problem List Patient Active Problem List   Diagnosis Date Noted  . Insomnia due to medical condition 07/17/2019  . Encounter for long-term use of opiate analgesic 04/08/2019  . Encounter for medication monitoring 04/08/2019  . 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 Trochanteric bursitis of left hip 03/11/2019  . S/P hysterectomy 02/06/2019  . S/P TAH-BSO (total abdominal hysterectomy and bilateral salpingo-oophorectomy) 02/06/2019  . Myofascial pain dysfunction syndrome 09/27/2018  . Chronic bilateral back pain 09/03/2018  . Colitis 08/12/2015  . Oral thrush 11/18/2014  . B12 deficiency 11/17/2014  . Chronic pain syndrome 11/17/2014  . Numbness 11/17/2014  . Intractable headache 11/17/2014  . Intractable chronic migraine without aura and without status migrainosus 11/17/2014  . Migraines 10/11/2014  . Primary fibromyalgia syndrome 10/11/2014  . Lower extremity weakness 10/11/2014  . Vitamin B12 deficiency  10/11/2014  . Anxiety state 10/11/2014  . Nausea & vomiting 10/08/2014  . Headache, tension type, chronic 09/07/2012  . Abdominal pain 06/15/2012    3:14 PM, 04/28/20 Georges Lynch PT DPT  Physical Therapist with [facility]  [facility]  [phone]   [facility] [address] [place], [place], [address] Phone: [phone]   Fax:  [phone]  Name: Tanya Krause MRN: [id number] Date of Birth: Jul 24, 1985

## 2020-04-30 ENCOUNTER — Other Ambulatory Visit: Payer: Self-pay

## 2020-04-30 ENCOUNTER — Encounter (HOSPITAL_COMMUNITY): Payer: Self-pay | Admitting: Physical Therapy

## 2020-04-30 ENCOUNTER — Ambulatory Visit (HOSPITAL_COMMUNITY): Payer: Medicaid Other | Admitting: Physical Therapy

## 2020-04-30 DIAGNOSIS — R262 Difficulty in walking, not elsewhere classified: Secondary | ICD-10-CM

## 2020-04-30 DIAGNOSIS — M6281 Muscle weakness (generalized): Secondary | ICD-10-CM

## 2020-04-30 DIAGNOSIS — R52 Pain, unspecified: Secondary | ICD-10-CM

## 2020-04-30 NOTE — Therapy (Signed)
[facility] [address] [place], [place], [address] Phone: [phone]   Fax:  [phone]  Physical Therapy Treatment  Patient Details  Name: Tanya Krause MRN: [id number] Date of Birth: 01/09/1986 Referring Provider (PT): Francene Castle for pain management; Verlon Au, MS   Encounter Date: 04/30/2020   PT End of Session - 04/30/20 0955    Visit Number 8    Number of Visits 12    Date for PT Re-Evaluation 05/13/20    Authorization Type medicaid UHC no auth for adults, received order to begin OT -  may have to split visits    Authorization - Visit Number 7    Authorization - Number of Visits 27    Progress Note Due on Visit 10    PT Start Time [phone]    PT Stop Time 1028    PT Time Calculation (min) 41 min    Equipment Utilized During Treatment    Activity Tolerance Patient limited by fatigue;Patient limited by pain    Behavior During Therapy [facility] for tasks assessed/performed           Past Medical History:  Diagnosis Date  . Asthma   . Chronic abdominal pain   . Chronic migraine   . Chronic pain syndrome   . Fibromyalgia   . 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 Hypertension   . Migraine   . Pelvic inflammatory disease (PID)     Past Surgical History:  Procedure Laterality Date  . HYSTERECTOMY ABDOMINAL WITH SALPINGO-OOPHORECTOMY Bilateral 02/06/2019   Procedure: HYSTERECTOMY ABDOMINAL WITH BILATERAL SALPINGO-OOPHORECTOMY;  Surgeon: Lazaro Arms, MD;  Location: [facility];  Service: Gynecology;  Laterality: Bilateral;  . kidney infections    . TUBAL LIGATION    . WISDOM TOOTH EXTRACTION      There were no vitals filed for this visit.   Subjective Assessment - 04/30/20 0951    Subjective More pain today, feels injections wore off. More tremors. Not feeling good today.    Pertinent History scoliosis, fibromyalgia, RA, myofascial pain syndrome    Patient Stated Goals to be able to function better    Currently in  Pain? Yes    Pain Score 8     Pain Location Back    Pain Orientation Posterior    Pain Descriptors / Indicators Aching;Stabbing;Throbbing    Pain Type Chronic pain    Pain Onset More than a month ago    Pain Frequency Constant    Pain Onset More than a month ago                             [facility] Adult PT Treatment/Exercise - 04/30/20 0001      Exercises   Exercises Lumbar      Lumbar Exercises: Standing   Heel Raises 5 reps    Heel Raises Limitations too difficult      Lumbar Exercises: Supine   Ab Set 10 reps    Glut Set 10 reps    Heel Slides 10 reps      Knee/Hip Exercises: Aerobic   Nustep 5 minutes EOS no resisitance                    PT Short Term Goals - 03/18/20 0951      PT SHORT TERM GOAL #1   Title Patient will be independent in self management strategies to improve quality of  life and functional outcomes.    Time 4    Period Weeks    Status New    Target Date 04/15/20      PT SHORT TERM GOAL #2   Title Patient will report at least 25% improvement in overall symptoms and/or function to demonstrate improved functional mobility    Time 4    Period Weeks    Status New    Target Date 04/15/20      PT SHORT TERM GOAL #3   Title Patient will be able to walk at least 75 feet with RW in to demonstrate improved walking endurance    Time 4    Period Weeks    Status New    Target Date 04/15/20      PT SHORT TERM GOAL #4   Title Patient will be able to demonstrate at least 2 sit to stands in 30 seconds sit to stand test with use of one hand as needed    Time 4    Period Weeks    Status New    Target Date 04/15/20             PT Long Term Goals - 03/18/20 0951      PT LONG TERM GOAL #1   Title Patient will be able to demonstrate at least 4 sit to stands in 30 seconds sit to stand test with use of one hand as needed    Time 8    Period Weeks    Status New    Target Date 05/13/20      PT LONG TERM GOAL #2   Title  Patient will be able to walk at least 125 feet with RW in to demonstrate improved walking endurance    Time 8    Period Weeks    Status New    Target Date 05/13/20      PT LONG TERM GOAL #3   Title Patient will report at least 50% improvement in overall symptoms and/or function to demonstrate improved functional mobility    Time 8    Period Weeks    Status New    Target Date 05/13/20                 Plan - 04/30/20 1027    Clinical Impression Statement Patient pain limited and very labored with attempted standing LE strengthening. Modified activity to supine on incline pillows. Added supine core and LE strength/ mobility exercises. Patient performs in very slow, labored manner. Patient educated on possible trial of TENs unit for back pain and on proper form and function of added exercise today. Patient issued updated HEP handout and TENs handout. Patient will continue to benefit from skilled therapy services to reduce deficits and improve functional ability.    Personal Factors and Comorbidities Comorbidity 1;Comorbidity 2;Comorbidity 3+    Comorbidities fibromyalgia, RA, myofascial pain syndrome    Examination-Activity Limitations Bed Mobility;Lift;Locomotion Level;Transfers;Stand;Squat;Sit;Bathing;Bend;Carry    Examination-Participation Restrictions Meal Prep;Community Activity;Cleaning;Shop    Stability/Clinical Decision Making Evolving/Moderate complexity    Rehab Potential Fair    PT Frequency --   1-2x/week for total of 12 visits over 8 week certification period   PT Duration 8 weeks    PT Treatment/Interventions ADLs/Self Care Home Management;Aquatic Therapy;Cryotherapy;Electrical Stimulation;Moist Heat;Traction;Balance training;Therapeutic exercise;Therapeutic activities;Functional mobility training;Stair training;Gait training;DME Instruction;Neuromuscular re-education;Patient/family education;Manual techniques;Passive range of motion    PT Next Visit Plan Trial with  yoga exercises for core and proximal strengthening and mobility next session.  (  quaduped UE/LE extension, child's pose, static balance...)  Continue functional strength, walking, pain management strategies, aquatics.    PT Home Exercise Plan LAQs, hip add isometrics seated, marching 4/14 ab set, heel slide, glute set    Consulted and Agree with Plan of Care Patient           Patient will benefit from skilled therapeutic intervention in order to improve the following deficits and impairments:  Decreased endurance,Decreased activity tolerance,Decreased knowledge of use of DME,Decreased strength,Pain,Difficulty walking,Decreased mobility,Decreased balance,Decreased range of motion  Visit Diagnosis: Muscle weakness (generalized)  Generalized pain  Difficulty in walking, not elsewhere classified     Problem List Patient Active Problem List   Diagnosis Date Noted  . Insomnia due to medical condition 07/17/2019  . Encounter for long-term use of opiate analgesic 04/08/2019  . Encounter for medication monitoring 04/08/2019  . 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 Trochanteric bursitis of left hip 03/11/2019  . S/P hysterectomy 02/06/2019  . S/P TAH-BSO (total abdominal hysterectomy and bilateral salpingo-oophorectomy) 02/06/2019  . Myofascial pain dysfunction syndrome 09/27/2018  . Chronic bilateral back pain 09/03/2018  . Colitis 08/12/2015  . Oral thrush 11/18/2014  . B12 deficiency 11/17/2014  . Chronic pain syndrome 11/17/2014  . Numbness 11/17/2014  . Intractable headache 11/17/2014  . Intractable chronic migraine without aura and without status migrainosus 11/17/2014  . Migraines 10/11/2014  . Primary fibromyalgia syndrome 10/11/2014  . Lower extremity weakness 10/11/2014  . Vitamin B12 deficiency 10/11/2014  . Anxiety state 10/11/2014  . Nausea & vomiting 10/08/2014  . Headache, tension type, chronic  09/07/2012  . Abdominal pain 06/15/2012   11:48 AM, 04/30/20 Georges Lynch PT DPT  Physical Therapist with [facility]  [facility]  [phone]   [facility] [address] [place], [place], [address] Phone: [phone]   Fax:  [phone]  Name: Tanya Krause MRN: [id number] Date of Birth: [birth year]

## 2020-04-30 NOTE — Patient Instructions (Signed)
Access Code: [email or web address] URL: [email or web address] Date: 04/30/2020 Prepared by: Georges Lynch  Exercises Supine Transversus Abdominis Bracing - Hands on Stomach - 3 x daily - 7 x weekly - 1 sets - 10 reps - 5 second hold Supine Gluteal Sets - 3 x daily - 7 x weekly - 1 sets - 10 reps - 5 second hold Supine Heel Slide - 3 x daily - 7 x weekly - 1 sets - 10 reps - 5 second hold

## 2020-05-04 ENCOUNTER — Ambulatory Visit (HOSPITAL_COMMUNITY): Payer: Medicaid Other | Admitting: Physical Therapy

## 2020-05-07 ENCOUNTER — Encounter (HOSPITAL_COMMUNITY): Payer: Medicaid Other

## 2020-05-07 ENCOUNTER — Telehealth (HOSPITAL_COMMUNITY): Payer: Self-pay

## 2020-05-07 NOTE — Telephone Encounter (Signed)
PT called [employer] @[phone] ext Transportation and schedule all her apptments they did not show up to pick her up> Pt did a 3way call with our office, Ins Rep [phone] Ref#[id number] to document reason for cx-lation today.

## 2020-05-07 NOTE — Telephone Encounter (Signed)
No show, called and spoke with pt who stated transportation never showed today.  Becky Sax, LPTA/CLT; [place] [phone]

## 2020-05-11 ENCOUNTER — Other Ambulatory Visit: Payer: Self-pay

## 2020-05-11 ENCOUNTER — Encounter (HOSPITAL_COMMUNITY): Payer: Self-pay | Admitting: Physical Therapy

## 2020-05-11 ENCOUNTER — Ambulatory Visit (HOSPITAL_COMMUNITY): Payer: Medicaid Other | Admitting: Physical Therapy

## 2020-05-11 DIAGNOSIS — R262 Difficulty in walking, not elsewhere classified: Secondary | ICD-10-CM | POA: Diagnosis not present

## 2020-05-11 DIAGNOSIS — R52 Pain, unspecified: Secondary | ICD-10-CM

## 2020-05-11 DIAGNOSIS — M6281 Muscle weakness (generalized): Secondary | ICD-10-CM

## 2020-05-11 NOTE — Therapy (Signed)
[facility] [address] [place], [place], [address] Phone: [phone]   Fax:  [phone]  Physical Therapy Treatment  Patient Details  Name: Tanya Krause MRN: [id number] Date of Birth: [birth year] Referring Provider (PT): Francene Castle for pain management; Verlon Au, MS   Encounter Date: 05/11/2020   PT End of Session - 05/11/20 1745    Visit Number 9    Number of Visits 12    Date for PT Re-Evaluation 05/13/20    Authorization Type medicaid UHC no auth for adults, received order to begin OT -  may have to split visits    Authorization - Visit Number 8    Authorization - Number of Visits 27    Progress Note Due on Visit 10    PT Start Time 1325   arrived late   PT Stop Time 1355    PT Time Calculation (min) 30 min    Equipment Utilized During Treatment    Activity Tolerance Patient limited by pain    Behavior During Therapy [facility] for tasks assessed/performed           Past Medical History:  Diagnosis Date  . Asthma   . Chronic abdominal pain   . Chronic migraine   . Chronic pain syndrome   . Fibromyalgia   . 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 Hypertension   . Migraine   . Pelvic inflammatory disease (PID)     Past Surgical History:  Procedure Laterality Date  . HYSTERECTOMY ABDOMINAL WITH SALPINGO-OOPHORECTOMY Bilateral 02/06/2019   Procedure: HYSTERECTOMY ABDOMINAL WITH BILATERAL SALPINGO-OOPHORECTOMY;  Surgeon: Lazaro Arms, MD;  Location: [facility];  Service: Gynecology;  Laterality: Bilateral;  . kidney infections    . TUBAL LIGATION    . WISDOM TOOTH EXTRACTION      There were no vitals filed for this visit.   Subjective Assessment - 05/11/20 1735    Subjective Patient says her back is bad today. She is using her walker and back brace recently due to elevated pain. She states her transport didn't show last week and was running late today.    Pertinent History scoliosis, fibromyalgia, RA, myofascial  pain syndrome    Patient Stated Goals to be able to function better    Currently in Pain? Yes    Pain Score 9     Pain Location Back    Pain Orientation Posterior;Lower    Pain Descriptors / Indicators Aching;Stabbing;Throbbing    Pain Type Chronic pain    Pain Onset More than a month ago    Pain Frequency Constant    Pain Onset More than a month ago                         Adult Aquatic Therapy - 05/11/20 1750      Treatment   Exercises marching x20, heel raise x15, toe raise x10, hip extension x20 ea, hip abduction x 20 ea, knee flexion x 15 each, standng balance 3 x 20" with pool noodle, sidestepping with pool noodle 3RT, staggered stance 3 x 20"                        PT Short Term Goals - 03/18/20 7416      PT SHORT TERM GOAL #1   Title Patient will be independent in self management strategies to improve quality of life and functional outcomes.    Time  4    Period Weeks    Status New    Target Date 04/15/20      PT SHORT TERM GOAL #2   Title Patient will report at least 25% improvement in overall symptoms and/or function to demonstrate improved functional mobility    Time 4    Period Weeks    Status New    Target Date 04/15/20      PT SHORT TERM GOAL #3   Title Patient will be able to walk at least 75 feet with RW in to demonstrate improved walking endurance    Time 4    Period Weeks    Status New    Target Date 04/15/20      PT SHORT TERM GOAL #4   Title Patient will be able to demonstrate at least 2 sit to stands in 30 seconds sit to stand test with use of one hand as needed    Time 4    Period Weeks    Status New    Target Date 04/15/20             PT Long Term Goals - 03/18/20 0951      PT LONG TERM GOAL #1   Title Patient will be able to demonstrate at least 4 sit to stands in 30 seconds sit to stand test with use of one hand as needed    Time 8    Period Weeks    Status New    Target Date 05/13/20      PT  LONG TERM GOAL #2   Title Patient will be able to walk at least 125 feet with RW in to demonstrate improved walking endurance    Time 8    Period Weeks    Status New    Target Date 05/13/20      PT LONG TERM GOAL #3   Title Patient will report at least 50% improvement in overall symptoms and/or function to demonstrate improved functional mobility    Time 8    Period Weeks    Status New    Target Date 05/13/20                 Plan - 05/11/20 1745    Clinical Impression Statement Patient continues to be pain limited, but with overall improved tolerance in gravity minimized setting. Patient continues to perform all activity in slow manner, but with increased tremors today. Patient was very challenged with balance activity and has difficulty maintaining standing balance without HHA even with support of buoyant pool noodles under arm. Patient somewhat less steady overall today, but able to complete ther ex in pool with noted fatigue requiring several rest breaks throughout. Perform reassessment next visit, will adjust POC as indicated.    Personal Factors and Comorbidities Comorbidity 1;Comorbidity 2;Comorbidity 3+    Comorbidities fibromyalgia, RA, myofascial pain syndrome    Examination-Activity Limitations Bed Mobility;Lift;Locomotion Level;Transfers;Stand;Squat;Sit;Bathing;Bend;Carry    Examination-Participation Restrictions Meal Prep;Community Activity;Cleaning;Shop    Stability/Clinical Decision Making Evolving/Moderate complexity    Rehab Potential Fair    PT Frequency --   1-2x/week for total of 12 visits over 8 week certification period   PT Duration 8 weeks    PT Treatment/Interventions ADLs/Self Care Home Management;Aquatic Therapy;Cryotherapy;Electrical Stimulation;Moist Heat;Traction;Balance training;Therapeutic exercise;Therapeutic activities;Functional mobility training;Stair training;Gait training;DME Instruction;Neuromuscular re-education;Patient/family  education;Manual techniques;Passive range of motion    PT Next Visit Plan Reassess next visit. Adjust POC as indicated    PT Home Exercise Plan LAQs, hip  add isometrics seated, marching 4/14 ab set, heel slide, glute set    Consulted and Agree with Plan of Care Patient           Patient will benefit from skilled therapeutic intervention in order to improve the following deficits and impairments:  Decreased endurance,Decreased activity tolerance,Decreased knowledge of use of DME,Decreased strength,Pain,Difficulty walking,Decreased mobility,Decreased balance,Decreased range of motion  Visit Diagnosis: Muscle weakness (generalized)  Generalized pain  Difficulty in walking, not elsewhere classified     Problem List Patient Active Problem List   Diagnosis Date Noted  . Insomnia due to medical condition 07/17/2019  . Encounter for long-term use of opiate analgesic 04/08/2019  . Encounter for medication monitoring 04/08/2019  . 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 Trochanteric bursitis of left hip 03/11/2019  . S/P hysterectomy 02/06/2019  . S/P TAH-BSO (total abdominal hysterectomy and bilateral salpingo-oophorectomy) 02/06/2019  . Myofascial pain dysfunction syndrome 09/27/2018  . Chronic bilateral back pain 09/03/2018  . Colitis 08/12/2015  . Oral thrush 11/18/2014  . B12 deficiency 11/17/2014  . Chronic pain syndrome 11/17/2014  . Numbness 11/17/2014  . Intractable headache 11/17/2014  . Intractable chronic migraine without aura and without status migrainosus 11/17/2014  . Migraines 10/11/2014  . Primary fibromyalgia syndrome 10/11/2014  . Lower extremity weakness 10/11/2014  . Vitamin B12 deficiency 10/11/2014  . Anxiety state 10/11/2014  . Nausea & vomiting 10/08/2014  . Headache, tension type, chronic 09/07/2012  . Abdominal pain 06/15/2012   5:53 PM, 05/11/20 Georges Lynch PT DPT  Physical  Therapist with [facility]  [facility]  [phone]   [facility] [address] [place], [place], [address] Phone: [phone]   Fax:  [phone]  Name: Tanya Krause MRN: [id number] Date of Birth: 1986/01/11

## 2020-05-14 ENCOUNTER — Telehealth (HOSPITAL_COMMUNITY): Payer: Self-pay | Admitting: Physical Therapy

## 2020-05-14 ENCOUNTER — Ambulatory Visit (HOSPITAL_COMMUNITY): Payer: Medicaid Other | Admitting: Physical Therapy

## 2020-05-14 ENCOUNTER — Ambulatory Visit (HOSPITAL_COMMUNITY): Payer: Medicaid Other | Admitting: Occupational Therapy

## 2020-05-14 NOTE — Telephone Encounter (Signed)
Patient called with [employer] rep (Dawn L) on the phone - we had a discussion that patient is not being picked up for her PT/OT apptments by Transportation and pt has filed a complaint. Pt will call her family to ask them to bring her-will call back  r/s

## 2020-05-27 ENCOUNTER — Encounter (HOSPITAL_COMMUNITY): Payer: Self-pay | Admitting: Specialist

## 2020-05-27 ENCOUNTER — Ambulatory Visit (HOSPITAL_COMMUNITY): Payer: Medicaid Other | Attending: Family Medicine | Admitting: Specialist

## 2020-05-27 ENCOUNTER — Ambulatory Visit (HOSPITAL_COMMUNITY): Payer: Medicaid Other | Admitting: Specialist

## 2020-05-27 ENCOUNTER — Other Ambulatory Visit: Payer: Self-pay

## 2020-05-27 DIAGNOSIS — R29898 Other symptoms and signs involving the musculoskeletal system: Secondary | ICD-10-CM | POA: Diagnosis present

## 2020-05-27 DIAGNOSIS — R52 Pain, unspecified: Secondary | ICD-10-CM | POA: Diagnosis present

## 2020-05-27 DIAGNOSIS — M6281 Muscle weakness (generalized): Secondary | ICD-10-CM | POA: Insufficient documentation

## 2020-05-27 DIAGNOSIS — R601 Generalized edema: Secondary | ICD-10-CM | POA: Diagnosis present

## 2020-05-27 DIAGNOSIS — M79641 Pain in right hand: Secondary | ICD-10-CM | POA: Diagnosis present

## 2020-05-27 DIAGNOSIS — R262 Difficulty in walking, not elsewhere classified: Secondary | ICD-10-CM | POA: Diagnosis present

## 2020-05-27 DIAGNOSIS — M79642 Pain in left hand: Secondary | ICD-10-CM | POA: Insufficient documentation

## 2020-05-28 NOTE — Therapy (Signed)
[facility] [address] [place], [place], [address] Phone: [phone]   Fax:  [phone]  Occupational Therapy Evaluation  Patient Details  Name: Tanya Krause MRN: [id number] Date of Birth: 01-30-1985 Referring Provider (OT): Dr. Payton Emerald   Encounter Date: 05/27/2020   OT End of Session - 05/27/20 2204    Visit Number 1    Number of Visits 6    Date for OT Re-Evaluation 07/08/20    Authorization Type UHC Medicaid 27 visits allowed - will need to split with PT    Authorization - Visit Number 1    Authorization - Number of Visits 13    OT Start Time 1645    OT Stop Time 1730    OT Time Calculation (min) 45 min    Activity Tolerance Patient tolerated treatment well    Behavior During Therapy [facility] for tasks assessed/performed           Past Medical History:  Diagnosis Date  . Asthma   . Chronic abdominal pain   . Chronic migraine   . Chronic pain syndrome   . Fibromyalgia   . 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 Hypertension   . Migraine   . Pelvic inflammatory disease (PID)     Past Surgical History:  Procedure Laterality Date  . HYSTERECTOMY ABDOMINAL WITH SALPINGO-OOPHORECTOMY Bilateral 02/06/2019   Procedure: HYSTERECTOMY ABDOMINAL WITH BILATERAL SALPINGO-OOPHORECTOMY;  Surgeon: Lazaro Arms, MD;  Location: [facility];  Service: Gynecology;  Laterality: Bilateral;  . kidney infections    . TUBAL LIGATION    . WISDOM TOOTH EXTRACTION      There were no vitals filed for this visit.   Subjective Assessment - 05/27/20 2157    Subjective  S: I cant do anything with my hands  they get really swollen and painful.    Pertinent History Patient with past medical history signficant for fibromyalgia, chronic pain, neuropathy with increased pain and decreased mobility and strength in her hands for several years.  She has been referred to OT for evaluation and treatment and compresion gloves    Repetition Increases  Symptoms    Patient Stated Goals I want to get rid of the pain in my hands    Currently in Pain? Yes    Pain Score 9     Pain Location Hand    Pain Descriptors / Indicators Aching;Burning;Tender;Shooting;Pins and needles;Pressure    Pain Type Chronic pain    Pain Radiating Towards hands to arms    Pain Onset More than a month ago    Pain Frequency Constant    Aggravating Factors  use    Pain Relieving Factors none    Effect of Pain on Daily Activities max             [facility] OT Assessment - 05/28/20 0001      Assessment   Medical Diagnosis Bilateral hand pain and edema    Referring Provider (OT) Dr. Payton Emerald    Onset Date/Surgical Date --   chronic   Hand Dominance Right    Prior Therapy currently receiving PT      Precautions   Precautions Fall      Restrictions   Weight Bearing Restrictions No      Balance Screen   Has the patient fallen in the past 6 months No    Has the patient had a decrease in activity level because of a fear of falling?  No  Is the patient reluctant to leave their home because of a fear of falling?  No      Home  Environment   Family/patient expects to be discharged to: Private residence    Living Arrangements Parent    Available Help at Discharge Family    Type of Home House      Prior Function   Level of Independence Needs assistance with ADLs;Needs assistance with homemaking;Independent with household mobility with device;Independent with community mobility with device    Vocation Unemployed    Leisure n/a      ADL   ADL comments has aide 7 days per week to assist with B/IADLs, unable to complete any BADL task without siginificant pain or assistance      Written Expression   Dominant Hand Right    Handwriting --   unable to write for extended periods of time due to pain     Vision - History   Baseline Vision No visual deficits      Sensation   Light Touch --   hypersensitivity to all stimuli in bilateral hands      Coordination   9 Hole Peg Test Right;Left    Right 9 Hole Peg Test 2'42"    Left 9 Hole Peg Test 2'30"      Edema   Edema Wrist:  right:  18.5cm left:  17.6 cm, Distal palmar crease:  right:  20 cm left:  19.5 cm, MCPJ:  right:  18.5 cm left:  17.6 cm      ROM / Strength   AROM / PROM / Strength AROM      AROM   AROM Assessment Site Finger    Right/Left Finger Left    Right Composite Finger Extension 75%    Right Composite Finger Flexion 75%      Hand Function   Right Hand Gross Grasp Impaired    Right Hand Grip (lbs) 18    Right Hand Lateral Pinch 2 lbs    Right Hand 3 Point Pinch 2 lbs    Left Hand Gross Grasp Impaired    Left Hand Grip (lbs) 5    Left Hand Lateral Pinch 2 lbs    Left 3 point pinch 2 lbs                    OT Treatments/Exercises (OP) - 05/28/20 0001      Manual Therapy   Manual Therapy Edema management    Edema Management issued bilateral edema gloves size small for patient to wear at all times, removing for hygiene for decreasing edema.  Patient demonstrated independence with donning and doffing edema gloves and voiced understanding of care and contraindications                 OT Education - 05/27/20 2201    Education Details educated on wear and care of bilateral edema gloves, recommending to wear as much as possible, removing for hygiene.  recommended wearing while completing light adl tasks to determine if swelling and pain decrease.    Person(s) Educated Patient    Methods Explanation    Comprehension Verbalized understanding;Returned demonstration            OT Short Term Goals - 05/28/20 0834      OT SHORT TERM GOAL #1   Title Patient will be educated and independent with HEP for improved functional use of her hands.    Time 6    Period Weeks  Status New    Target Date 07/08/20      OT SHORT TERM GOAL #2   Title Patient will improve functional grip from 75% to 90% in bilateral hands.    Time 6    Period Weeks     Status New      OT SHORT TERM GOAL #3   Title Patient will improve bilateral grip strength by 10# and pinch strength by 2# for improved ability to maintain grasp on walker and drink bottles.    Time 6    Period Weeks    Status New      OT SHORT TERM GOAL #4   Title Patient will improve fine motor coordination as evidence by decreasing completion time on nine hole peg test by 30" or more for improved ability to tie her shoes and button buttons.    Time 6    Period Weeks    Status New      OT SHORT TERM GOAL #5   Title Patient will decrease edema by .5 cm or more in bilateral hands for improved mobility and functional use.    Time 6    Period Weeks    Status New      Additional Short Term Goals   Additional Short Term Goals Yes      OT SHORT TERM GOAL #6   Title patient will decrease pain in her hands to 5/10 or better during functional tasks.    Time 6    Period Weeks    Status New      OT SHORT TERM GOAL #7   Title patient will decrease hypersensitivity in her hands by 50% per patient report for increased ability to complete daily tasks.    Time 6    Period Weeks    Status New                    Plan - 05/27/20 2207    Clinical Impression Statement A:  Patient is a 35 year old female with past medical history significant for fibromyalgia, headaches, chronic pain, neuropathy whom is experiencing increased pain and edema and hypersensitvity with decreased mobility, strength, and coordination in her hands.  She is not able to complete daily tasks independently and has a nurse 7 days per week to assist with adl needs.    OT Occupational Profile and History Comprehensive Assessment- Review of records and extensive additional review of physical, cognitive, psychosocial history related to current functional performance    Occupational performance deficits (Please refer to evaluation for details): ADL's;IADL's;Leisure    Body Structure / Function / Physical Skills  ADL;Coordination;GMC;Muscle spasms;UE functional use;Sensation;Decreased knowledge of use of DME;IADL;Pain;Strength;FMC;Mobility;ROM;Tone;Edema    Rehab Potential Poor    Clinical Decision Making Multiple treatment options, significant modification of task necessary    Comorbidities Affecting Occupational Performance: Presence of comorbidities impacting occupational performance    Modification or Assistance to Complete Evaluation  Max significant modification of tasks or assist is necessary to complete    OT Frequency 1x / week    OT Duration 6 weeks    OT Treatment/Interventions Self-care/ADL training;Cryotherapy;Paraffin;Therapeutic exercise;DME and/or AE instruction;Manual Therapy;Neuromuscular education;Moist Heat;Energy conservation;Passive range of motion;Therapeutic activities;Patient/family education;Coping strategies training    Plan P:  Skilled OT intervention to educate patient on use of adaptive equipment for improved independence with daily tasks, edema management, improving bilateral hand mobility, strength, and coordination while decreasing pain.  Next session:  follow up on use of edema gloves, educate on  adaptive equipment, begin gentle functional tasks with bilateral hands to improve mobility and range of motion    OT Home Exercise Plan eval:  edema glove use    Recommended Other Services resume PT    Consulted and Agree with Plan of Care Patient           Patient will benefit from skilled therapeutic intervention in order to improve the following deficits and impairments:   Body Structure / Function / Physical Skills: ADL,Coordination,GMC,Muscle spasms,UE functional use,Sensation,Decreased knowledge of use of DME,IADL,Pain,Strength,FMC,Mobility,ROM,Tone,Edema       Visit Diagnosis: Pain in left hand - Plan: Ot plan of care cert/re-cert  Pain in right hand - Plan: Ot plan of care cert/re-cert  Generalized edema - Plan: Ot plan of care cert/re-cert  Other symptoms and  signs involving the musculoskeletal system - Plan: Ot plan of care cert/re-cert    Problem List Patient Active Problem List   Diagnosis Date Noted  . Insomnia due to medical condition 07/17/2019  . Encounter for long-term use of opiate analgesic 04/08/2019  . Encounter for medication monitoring 04/08/2019  . 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 Trochanteric bursitis of left hip 03/11/2019  . S/P hysterectomy 02/06/2019  . S/P TAH-BSO (total abdominal hysterectomy and bilateral salpingo-oophorectomy) 02/06/2019  . Myofascial pain dysfunction syndrome 09/27/2018  . Chronic bilateral back pain 09/03/2018  . Colitis 08/12/2015  . Oral thrush 11/18/2014  . B12 deficiency 11/17/2014  . Chronic pain syndrome 11/17/2014  . Numbness 11/17/2014  . Intractable headache 11/17/2014  . Intractable chronic migraine without aura and without status migrainosus 11/17/2014  . Migraines 10/11/2014  . Primary fibromyalgia syndrome 10/11/2014  . Lower extremity weakness 10/11/2014  . Vitamin B12 deficiency 10/11/2014  . Anxiety state 10/11/2014  . Nausea & vomiting 10/08/2014  . Headache, tension type, chronic 09/07/2012  . Abdominal pain 06/15/2012    Shirlean Mylar, [place], OTR/L [phone]  05/28/2020, 10:54 AM  [facility] [address] [place], [place], [address] Phone: [phone]   Fax:  [phone]  Name: LOLISA MUSCH MRN: [id number] Date of Birth: [birth year]

## 2020-06-01 ENCOUNTER — Other Ambulatory Visit: Payer: Self-pay | Admitting: *Deleted

## 2020-06-01 MED ORDER — SUMATRIPTAN SUCCINATE 100 MG PO TABS: 100.0000 mg | ORAL_TABLET | ORAL | 5 refills | Status: DC | PRN

## 2020-06-01 MED ORDER — HYDROCODONE-ACETAMINOPHEN 5-325 MG PO TABS: 1.0000 | ORAL_TABLET | Freq: Three times a day (TID) | ORAL | 0 refills | Status: DC | PRN

## 2020-06-01 NOTE — Telephone Encounter (Signed)
Patient called about refills for her norco and imitrex.  She is going to run out before her appt May 20th. She appears to have refills of imitrex, but will need pain med refilled. She asked that I update her preferred pharmacy to [facility] pharmacy, which I did.

## 2020-06-03 ENCOUNTER — Ambulatory Visit (HOSPITAL_COMMUNITY): Payer: Medicaid Other | Admitting: Occupational Therapy

## 2020-06-03 ENCOUNTER — Encounter (HOSPITAL_COMMUNITY): Payer: Self-pay | Admitting: Occupational Therapy

## 2020-06-03 ENCOUNTER — Other Ambulatory Visit: Payer: Self-pay

## 2020-06-03 DIAGNOSIS — M79642 Pain in left hand: Secondary | ICD-10-CM

## 2020-06-03 DIAGNOSIS — M79641 Pain in right hand: Secondary | ICD-10-CM

## 2020-06-03 DIAGNOSIS — R601 Generalized edema: Secondary | ICD-10-CM

## 2020-06-03 DIAGNOSIS — R29898 Other symptoms and signs involving the musculoskeletal system: Secondary | ICD-10-CM

## 2020-06-03 NOTE — Therapy (Signed)
[facility] [address] [place], [place], [address] Phone: [phone]   Fax:  [phone]  Occupational Therapy Treatment  Patient Details  Name: Tanya Krause MRN: [id number] Date of Birth: 02/13/1985 Referring Provider (OT): Dr. Payton Emerald   Encounter Date: 06/03/2020   OT End of Session - 06/03/20 1725    Visit Number 2    Number of Visits 6    Date for OT Re-Evaluation 07/08/20    Authorization Type UHC Medicaid 27 visits allowed - will need to split with PT    Authorization - Visit Number 2    Authorization - Number of Visits 13    OT Start Time 1349    OT Stop Time 1429    OT Time Calculation (min) 40 min    Activity Tolerance Patient tolerated treatment well    Behavior During Therapy [facility] for tasks assessed/performed           Past Medical History:  Diagnosis Date  . Asthma   . Chronic abdominal pain   . Chronic migraine   . Chronic pain syndrome   . Fibromyalgia   . 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 Hypertension   . Migraine   . Pelvic inflammatory disease (PID)     Past Surgical History:  Procedure Laterality Date  . HYSTERECTOMY ABDOMINAL WITH SALPINGO-OOPHORECTOMY Bilateral 02/06/2019   Procedure: HYSTERECTOMY ABDOMINAL WITH BILATERAL SALPINGO-OOPHORECTOMY;  Surgeon: Lazaro Arms, MD;  Location: [facility];  Service: Gynecology;  Laterality: Bilateral;  . kidney infections    . TUBAL LIGATION    . WISDOM TOOTH EXTRACTION      There were no vitals filed for this visit.   Subjective Assessment - 06/03/20 1353    Subjective  S: Today's a bad day.    Currently in Pain? Yes    Pain Score 9     Pain Location Hand    Pain Orientation Right;Left    Pain Descriptors / Indicators Sore;Dull;Constant;Stabbing    Pain Type Chronic pain    Pain Radiating Towards hands to arms    Pain Onset More than a month ago    Pain Frequency Constant    Aggravating Factors  use    Pain Relieving Factors none     Effect of Pain on Daily Activities max effect on ADLs    Multiple Pain Sites No              [facility] OT Assessment - 06/03/20 1353      Assessment   Medical Diagnosis Bilateral hand pain and edema      Precautions   Precautions Fall                    OT Treatments/Exercises (OP) - 06/03/20 1355      ADLs   ADL Comments Extensive education provided to pt on adaptive equipment available to for purchase to assist with ADLs. Pt educated on built-up utensils, long handled sponges, toilet aides including a long wand and a bidet. Educated pt on use of each and trialed built-up utensils and long handled sponge during session. Pt with improved comfort with utensil use. Also educated on the purpose and function of a bidet.      Exercises   Exercises Hand      Hand Exercises   Opposition AROM;Both;10 reps    Digit Abduction/Adduction BUE, A/ROM, 10X    Sponges attempted-unable to complete      Manual Therapy  Manual Therapy Edema management    Manual therapy comments completed separately from therapeutic exercises    Edema Management Retrograde massage completed to bilateral hands to decreased edema and improved mobility in digits and hands                    OT Short Term Goals - 06/03/20 1728      OT SHORT TERM GOAL #1   Title Patient will be educated and independent with HEP for improved functional use of her hands.    Time 6    Period Weeks    Status On-going    Target Date 07/08/20      OT SHORT TERM GOAL #2   Title Patient will improve functional grip from 75% to 90% in bilateral hands.    Time 6    Period Weeks    Status On-going      OT SHORT TERM GOAL #3   Title Patient will improve bilateral grip strength by 10# and pinch strength by 2# for improved ability to maintain grasp on walker and drink bottles.    Time 6    Period Weeks    Status On-going      OT SHORT TERM GOAL #4   Title Patient will improve fine motor coordination as evidence  by decreasing completion time on nine hole peg test by 30" or more for improved ability to tie her shoes and button buttons.    Time 6    Period Weeks    Status On-going      OT SHORT TERM GOAL #5   Title Patient will decrease edema by .5 cm or more in bilateral hands for improved mobility and functional use.    Time 6    Period Weeks    Status On-going      OT SHORT TERM GOAL #6   Title patient will decrease pain in her hands to 5/10 or better during functional tasks.    Time 6    Period Weeks    Status On-going      OT SHORT TERM GOAL #7   Title patient will decrease hypersensitivity in her hands by 50% per patient report for increased ability to complete daily tasks.    Time 6    Period Weeks    Status On-going                    Plan - 06/03/20 1725    Clinical Impression Statement A: Initiated edema management to decrease edema in bilateral hands and to allow pt to remove rings. Pt reports gloves are ok but feels like she might need a larger size. Educated pt on purpose of gloves, pt reporting she is unable to get the current ones on sometimes when swelling is bad. Attempted hand/digit ROM tasks, pt with limited ability to complete due to pain. Therefore transitioned to ADL education and available AE to assist with tasks. Pt educated on AE and use during ADLs and I/ADLs, provided handout with multiple options for LH sponges, bidet/toilet wands, and built-up eating utensils. OT provided foam to build up utensils at home.    Body Structure / Function / Physical Skills ADL;Coordination;GMC;Muscle spasms;UE functional use;Sensation;Decreased knowledge of use of DME;IADL;Pain;Strength;FMC;Mobility;ROM;Tone;Edema    Plan P: Follow up on AE education and foam for utensils. Attempt hand ROM-sponges, fine motor dexterity tasks. provide large edema gloves    OT Home Exercise Plan eval:  edema glove use    Consulted and Agree  with Plan of Care Patient           Patient will  benefit from skilled therapeutic intervention in order to improve the following deficits and impairments:   Body Structure / Function / Physical Skills: ADL,Coordination,GMC,Muscle spasms,UE functional use,Sensation,Decreased knowledge of use of DME,IADL,Pain,Strength,FMC,Mobility,ROM,Tone,Edema       Visit Diagnosis: Pain in left hand  Pain in right hand  Generalized edema  Other symptoms and signs involving the musculoskeletal system    Problem List Patient Active Problem List   Diagnosis Date Noted  . Insomnia due to medical condition 07/17/2019  . Encounter for long-term use of opiate analgesic 04/08/2019  . Encounter for medication monitoring 04/08/2019  . 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 Trochanteric bursitis of left hip 03/11/2019  . S/P hysterectomy 02/06/2019  . S/P TAH-BSO (total abdominal hysterectomy and bilateral salpingo-oophorectomy) 02/06/2019  . Myofascial pain dysfunction syndrome 09/27/2018  . Chronic bilateral back pain 09/03/2018  . Colitis 08/12/2015  . Oral thrush 11/18/2014  . B12 deficiency 11/17/2014  . Chronic pain syndrome 11/17/2014  . Numbness 11/17/2014  . Intractable headache 11/17/2014  . Intractable chronic migraine without aura and without status migrainosus 11/17/2014  . Migraines 10/11/2014  . Primary fibromyalgia syndrome 10/11/2014  . Lower extremity weakness 10/11/2014  . Vitamin B12 deficiency 10/11/2014  . Anxiety state 10/11/2014  . Nausea & vomiting 10/08/2014  . Headache, tension type, chronic 09/07/2012  . Abdominal pain 06/15/2012   Ezra Sites, OTR/L  [phone] 06/03/2020, 5:29 PM  [facility] [address] [place], [place], [address] Phone: [phone]   Fax:  [phone]  Name: Tanya Krause MRN: [id number] Date of Birth: [birth year]

## 2020-06-05 ENCOUNTER — Other Ambulatory Visit: Payer: Self-pay

## 2020-06-05 ENCOUNTER — Encounter
Payer: Medicaid Other | Attending: Physical Medicine and Rehabilitation | Admitting: Physical Medicine and Rehabilitation

## 2020-06-05 ENCOUNTER — Encounter: Payer: Self-pay | Admitting: Physical Medicine and Rehabilitation

## 2020-06-05 VITALS — BP 119/80 | HR 112 | Temp 98.8°F | Ht 60.0 in | Wt 174.4 lb

## 2020-06-05 DIAGNOSIS — M797 Fibromyalgia: Secondary | ICD-10-CM | POA: Diagnosis not present

## 2020-06-05 DIAGNOSIS — R112 Nausea with vomiting, unspecified: Secondary | ICD-10-CM | POA: Insufficient documentation

## 2020-06-05 DIAGNOSIS — M7918 Myalgia, other site: Secondary | ICD-10-CM | POA: Insufficient documentation

## 2020-06-05 DIAGNOSIS — G43519 Persistent migraine aura without cerebral infarction, intractable, without status migrainosus: Secondary | ICD-10-CM | POA: Insufficient documentation

## 2020-06-05 NOTE — Patient Instructions (Signed)
Plan: 1. Asked pt to talk to PCP about why having swelling.   2. Patient here for trigger point injections for  Consent done and on chart.  Cleaned areas with alcohol and injected using a 27 gauge 1.5 inch needle  Injected 6cc Using 1% Lidocaine with no EPI  Upper traps B/L Levators B/L Posterior scalenes B/L Middle scalenes Splenius Capitus B/L Pectoralis Major B/L Rhomboids B/L Infraspinatus Teres Major/minor Thoracic paraspinals Lumbar paraspinals B/L x2 Other injections- 1st dorsal interossei B/L   Patient's level of pain prior was 10/10 Current level of pain after injections is 8/10- comes down slightly  There was no bleeding or complications.  Patient was advised to drink a lot of water on day after injections to flush system Will have increased soreness for 12-48 hours after injections.  Can use Lidocaine patches the day AFTER injections Can use theracane on day of injections in places didn't inject Can use heating pad 4-6 hours AFTER injections  3. Suggest changing pillow to one with cut out for neck/head- esp if memory foam- could help pain in neck and upper back esp.   4. F/U in 6 weeks- for TrP injections.

## 2020-06-05 NOTE — Progress Notes (Signed)
Patient is is 35yr old female with hx of chronic pain syndrome- with myofascialpain syndrome, fibromyalgia-Also has sickle cell disease. And chornic migraines- intractable.     Here for f/u on FMS and myofascial pain syndrome and trigger point injections.    Isn't doing Ambien anymore- Psychiatrist took her off Ambien.  Has all meds- doesn't need refills. On something else, but doesn't remember the name.   When lays down in bed- cannot get comfortable to butt- propped with a lot of pillows-   L shoulder is swollen tender-  Wearing flipflops and hands, feet swelling more-  Is starting aqua therapy back for hand/foot pain.  Starts Monday she thinks.    "signed up for disability".  Has until June 25th- will hear if gets it.  This is her 2nd time.  Had a lawyer, but wasn't helpful.  "in stage 3" per disability- not sure what that means.    Bene drinking prune juice daily and with coffee- having BMs at least every 2-3 days.   GERD much better with Protonix- best thing to help lately.    Plan: 1. Asked pt to talk to PCP about why having swelling.   2. Patient here for trigger point injections for  Consent done and on chart.  Cleaned areas with alcohol and injected using a 27 gauge 1.5 inch needle  Injected 6cc Using 1% Lidocaine with no EPI  Upper traps B/L Levators B/L Posterior scalenes B/L Middle scalenes Splenius Capitus B/L Pectoralis Major B/L Rhomboids B/L Infraspinatus Teres Major/minor Thoracic paraspinals Lumbar paraspinals B/L x2 Other injections- 1st dorsal interossei B/L   Patient's level of pain prior was 10/10 Current level of pain after injections is 8/10- comes down slightly  There was no bleeding or complications.  Patient was advised to drink a lot of water on day after injections to flush system Will have increased soreness for 12-48 hours after injections.  Can use Lidocaine patches the day AFTER injections Can use theracane on day of  injections in places didn't inject Can use heating pad 4-6 hours AFTER injections  3. Suggest changing pillow to one with cut out for neck/head- esp if memory foam- could help pain in neck and upper back esp.   4. F/U in 6 weeks- for TrP injections.

## 2020-06-09 ENCOUNTER — Other Ambulatory Visit: Payer: Self-pay

## 2020-06-09 ENCOUNTER — Ambulatory Visit (HOSPITAL_COMMUNITY): Payer: Medicaid Other | Admitting: Physical Therapy

## 2020-06-09 DIAGNOSIS — M79642 Pain in left hand: Secondary | ICD-10-CM | POA: Diagnosis not present

## 2020-06-09 DIAGNOSIS — R52 Pain, unspecified: Secondary | ICD-10-CM

## 2020-06-09 DIAGNOSIS — R262 Difficulty in walking, not elsewhere classified: Secondary | ICD-10-CM

## 2020-06-09 DIAGNOSIS — M6281 Muscle weakness (generalized): Secondary | ICD-10-CM

## 2020-06-09 NOTE — Therapy (Signed)
[facility] [address] [place], [place], [address] Phone: [phone]   Fax:  [phone]  Physical Therapy Treatment  Patient Details  Name: Tanya Krause MRN: [id number] Date of Birth: 1985-12-01 Referring Provider (PT): Marvia Pickles for pain management; Drue Flirt, MS  PHYSICAL THERAPY DISCHARGE SUMMARY  Visits from Start of Care: 10  Current functional level related to goals / functional outcomes: See below    Remaining deficits: See below     Education / Equipment: See assessment  Plan: Patient agrees to discharge.  Patient goals were not met. Patient is being discharged due to lack of progress.  ?????       Encounter Date: 06/09/2020   PT End of Session - 06/09/20 0930    Visit Number 10    Number of Visits 12    Date for PT Re-Evaluation 06/09/20    Authorization Type medicaid UHC no auth for adults, received order to begin OT -  may have to split visits    Authorization - Visit Number 8    Authorization - Number of Visits 27    Progress Note Due on Visit 10    PT Start Time 0820    PT Stop Time 0900    PT Time Calculation (min) 40 min    Equipment Utilized During Treatment --   SBA for safety.  No back brace wore through session.   Activity Tolerance Patient limited by pain    Behavior During Therapy [facility] for tasks assessed/performed           Past Medical History:  Diagnosis Date  . Asthma   . Chronic abdominal pain   . Chronic migraine   . Chronic pain syndrome   . Fibromyalgia   . 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 Hypertension   . Migraine   . Pelvic inflammatory disease (PID)     Past Surgical History:  Procedure Laterality Date  . HYSTERECTOMY ABDOMINAL WITH SALPINGO-OOPHORECTOMY Bilateral 02/06/2019   Procedure: HYSTERECTOMY ABDOMINAL WITH BILATERAL SALPINGO-OOPHORECTOMY;  Surgeon: Florian Buff, MD;  Location: [facility];  Service: Gynecology;  Laterality: Bilateral;  . kidney  infections    . TUBAL LIGATION    . WISDOM TOOTH EXTRACTION      There were no vitals filed for this visit.       [facility] PT Assessment - 06/09/20 0001      Assessment   Medical Diagnosis chronic pain    Referring Provider (PT) Marvia Pickles for pain management; Drue Flirt, MS      Prior Function   Level of Independence Needs assistance with ADLs;Needs assistance with homemaking;Independent with household mobility with device;Independent with community mobility with device      Cognition   Overall Cognitive Status Within Functional Limits for tasks assessed      Strength   Strength Assessment Site Hip;Knee;Ankle    Right/Left Hip Right;Left    Right Hip Flexion 3-/5    Left Hip Flexion 3-/5    Right Knee Extension 3-/5    Left Knee Extension 3-/5      Transfers   Five time sit to stand comments  2 x using HHA x 1 in 34 seconds      Ambulation/Gait   Ambulation/Gait Yes    Ambulation/Gait Assistance 5: Supervision    Ambulation Distance (Feet) 80 Feet    Assistive device Rollator    Gait Pattern Decreased step length - right;Decreased step length - left;Decreased  stride length;Trunk flexed    Ambulation Surface Level;Indoor    Gait Comments 2MWT                                   PT Short Term Goals - 06/09/20 0847      PT SHORT TERM GOAL #1   Title Patient will be independent in self management strategies to improve quality of life and functional outcomes.    Baseline "I only have like 2 and I try them when I can"    Time 4    Period Weeks    Status Not Met    Target Date 04/15/20      PT SHORT TERM GOAL #2   Title Patient will report at least 25% improvement in overall symptoms and/or function to demonstrate improved functional mobility    Baseline Reports 10-15%    Time 4    Period Weeks    Status Not Met    Target Date 04/15/20      PT SHORT TERM GOAL #3   Title Patient will be able to walk at least 75 feet with RW in 2MW  to demonstrate improved walking endurance    Baseline Current 80 feet    Time 4    Period Weeks    Status Achieved    Target Date 04/15/20      PT SHORT TERM GOAL #4   Title Patient will be able to demonstrate at least 2 sit to stands in 30 seconds sit to stand test with use of one hand as needed    Baseline 2 x in 34 seconds    Time 4    Period Weeks    Status Not Met    Target Date 04/15/20             PT Long Term Goals - 06/09/20 0849      PT LONG TERM GOAL #1   Title Patient will be able to demonstrate at least 4 sit to stands in 30 seconds sit to stand test with use of one hand as needed    Baseline 2 x in 34 seconds    Time 8    Period Weeks    Status Not Met      PT LONG TERM GOAL #2   Title Patient will be able to walk at least 125 feet with RW in 2MW to demonstrate improved walking endurance    Baseline 80 feet    Time 8    Period Weeks    Status Not Met      PT LONG TERM GOAL #3   Title Patient will report at least 50% improvement in overall symptoms and/or function to demonstrate improved functional mobility    Baseline Reports 10-15%    Time 8    Period Weeks    Status Not Met                 Plan - 06/09/20 0930    Clinical Impression Statement Patient showing minimal progress toward therapy goals. Pain levels remain largely unchanged and continue to significantly limit function. Strength measures have decreased. Patient does show slightly better ambulation distance, but with several rest breaks due to pain. Ongoing gait deficits and transfer difficulties noted. At this time patient will be DC to HEP due to lack of progress. Educated patient on continuation of HEP for strengthening, and working with pain management. Reviewed HEP  and answered all patient questions. Encouraged Patient to follow up with therapy services with any further questions or concerns.    Personal Factors and Comorbidities Comorbidity 1;Comorbidity 2;Comorbidity 3+     Comorbidities fibromyalgia, RA, myofascial pain syndrome    Examination-Activity Limitations Bed Mobility;Lift;Locomotion Level;Transfers;Stand;Squat;Sit;Bathing;Bend;Carry    Examination-Participation Restrictions Meal Prep;Community Activity;Cleaning;Shop    Stability/Clinical Decision Making Evolving/Moderate complexity    Rehab Potential Fair    PT Frequency --   1-2x/week for total of 12 visits over 8 week certification period   PT Treatment/Interventions ADLs/Self Care Home Management;Aquatic Therapy;Cryotherapy;Electrical Stimulation;Moist Heat;Traction;Balance training;Therapeutic exercise;Therapeutic activities;Functional mobility training;Stair training;Gait training;DME Instruction;Neuromuscular re-education;Patient/family education;Manual techniques;Passive range of motion    PT Next Visit Plan DC to HEP    PT Home Exercise Plan LAQs, hip add isometrics seated, marching 4/14 ab set, heel slide, glute set    Consulted and Agree with Plan of Care Patient           Patient will benefit from skilled therapeutic intervention in order to improve the following deficits and impairments:  Decreased endurance,Decreased activity tolerance,Decreased knowledge of use of DME,Decreased strength,Pain,Difficulty walking,Decreased mobility,Decreased balance,Decreased range of motion  Visit Diagnosis: Muscle weakness (generalized)  Generalized pain  Difficulty in walking, not elsewhere classified     Problem List Patient Active Problem List   Diagnosis Date Noted  . Insomnia due to medical condition 07/17/2019  . Encounter for long-term use of opiate analgesic 04/08/2019  . Encounter for medication monitoring 04/08/2019  . 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 Trochanteric bursitis of left hip 03/11/2019  . S/P hysterectomy 02/06/2019  . S/P TAH-BSO (total abdominal hysterectomy and bilateral salpingo-oophorectomy)  02/06/2019  . Myofascial pain dysfunction syndrome 09/27/2018  . Chronic bilateral back pain 09/03/2018  . Colitis 08/12/2015  . Oral thrush 11/18/2014  . B12 deficiency 11/17/2014  . Chronic pain syndrome 11/17/2014  . Numbness 11/17/2014  . Intractable headache 11/17/2014  . Intractable chronic migraine without aura and without status migrainosus 11/17/2014  . Migraines 10/11/2014  . Primary fibromyalgia syndrome 10/11/2014  . Lower extremity weakness 10/11/2014  . Vitamin B12 deficiency 10/11/2014  . Anxiety state 10/11/2014  . Nausea & vomiting 10/08/2014  . Headache, tension type, chronic 09/07/2012  . Abdominal pain 06/15/2012   9:33 AM, 06/09/20 Josue Hector PT DPT  Physical Therapist with [facility]  [phone]   [facility] [address] [place], [place], [address] Phone: [phone]   Fax:  [phone]  Name: Tanya Krause MRN: [id number] Date of Birth: 04-03-1985

## 2020-06-11 ENCOUNTER — Ambulatory Visit (HOSPITAL_COMMUNITY): Payer: Medicaid Other | Admitting: Occupational Therapy

## 2020-06-19 ENCOUNTER — Encounter (HOSPITAL_COMMUNITY): Payer: Medicaid Other | Admitting: Occupational Therapy

## 2020-06-20 ENCOUNTER — Emergency Department (HOSPITAL_COMMUNITY): Payer: Medicaid Other

## 2020-06-20 ENCOUNTER — Other Ambulatory Visit: Payer: Self-pay

## 2020-06-20 ENCOUNTER — Encounter (HOSPITAL_COMMUNITY): Payer: Self-pay | Admitting: Emergency Medicine

## 2020-06-20 ENCOUNTER — Emergency Department (HOSPITAL_COMMUNITY)
Admission: EM | Admit: 2020-06-20 | Discharge: 2020-06-20 | Disposition: A | Discharge status: Home / Self Care | Payer: Medicaid Other | Attending: Emergency Medicine | Admitting: Emergency Medicine

## 2020-06-20 DIAGNOSIS — Z20822 Contact with and (suspected) exposure to covid-19: Secondary | ICD-10-CM | POA: Diagnosis not present

## 2020-06-20 DIAGNOSIS — F411 Generalized anxiety disorder: Secondary | ICD-10-CM | POA: Diagnosis present

## 2020-06-20 DIAGNOSIS — R059 Cough, unspecified: Secondary | ICD-10-CM | POA: Insufficient documentation

## 2020-06-20 DIAGNOSIS — R0602 Shortness of breath: Secondary | ICD-10-CM | POA: Diagnosis not present

## 2020-06-20 DIAGNOSIS — J45909 Unspecified asthma, uncomplicated: Secondary | ICD-10-CM | POA: Diagnosis not present

## 2020-06-20 DIAGNOSIS — I10 Essential (primary) hypertension: Secondary | ICD-10-CM | POA: Insufficient documentation

## 2020-06-20 DIAGNOSIS — Z28310 Unvaccinated for covid-19: Secondary | ICD-10-CM | POA: Insufficient documentation

## 2020-06-20 DIAGNOSIS — F17210 Nicotine dependence, cigarettes, uncomplicated: Secondary | ICD-10-CM | POA: Insufficient documentation

## 2020-06-20 DIAGNOSIS — Z7951 Long term (current) use of inhaled steroids: Secondary | ICD-10-CM | POA: Insufficient documentation

## 2020-06-20 DIAGNOSIS — M549 Dorsalgia, unspecified: Secondary | ICD-10-CM | POA: Diagnosis present

## 2020-06-20 DIAGNOSIS — Z91040 Latex allergy status: Secondary | ICD-10-CM | POA: Diagnosis not present

## 2020-06-20 DIAGNOSIS — D57219 Sickle-cell/Hb-C disease with crisis, unspecified: Secondary | ICD-10-CM | POA: Diagnosis not present

## 2020-06-20 DIAGNOSIS — G8929 Other chronic pain: Secondary | ICD-10-CM | POA: Diagnosis present

## 2020-06-20 DIAGNOSIS — G894 Chronic pain syndrome: Secondary | ICD-10-CM | POA: Diagnosis present

## 2020-06-20 LAB — CBC WITH DIFFERENTIAL/PLATELET
Abs Immature Granulocytes: 0.03 10*3/uL (ref 0.00–0.07)
Basophils Absolute: 0.1 10*3/uL (ref 0.0–0.1)
Basophils Relative: 1 %
Eosinophils Absolute: 0.3 10*3/uL (ref 0.0–0.5)
Eosinophils Relative: 2 %
HCT: 38.6 % (ref 36.0–46.0)
Hemoglobin: 13.3 g/dL (ref 12.0–15.0)
Immature Granulocytes: 0 %
Lymphocytes Relative: 34 %
Lymphs Abs: 3.5 10*3/uL (ref 0.7–4.0)
MCH: 28.9 pg (ref 26.0–34.0)
MCHC: 34.5 g/dL (ref 30.0–36.0)
MCV: 83.7 fL (ref 80.0–100.0)
Monocytes Absolute: 0.7 10*3/uL (ref 0.1–1.0)
Monocytes Relative: 7 %
Neutro Abs: 5.8 10*3/uL (ref 1.7–7.7)
Neutrophils Relative %: 56 %
Platelets: 275 10*3/uL (ref 150–400)
RBC: 4.61 MIL/uL (ref 3.87–5.11)
RDW: 13.3 % (ref 11.5–15.5)
WBC: 10.4 10*3/uL (ref 4.0–10.5)
nRBC: 0 % (ref 0.0–0.2)

## 2020-06-20 LAB — LACTIC ACID, PLASMA: Lactic Acid, Venous: 0.8 mmol/L (ref 0.5–1.9)

## 2020-06-20 LAB — COMPREHENSIVE METABOLIC PANEL
ALT: 15 U/L (ref 0–44)
AST: 17 U/L (ref 15–41)
Albumin: 4.3 g/dL (ref 3.5–5.0)
Alkaline Phosphatase: 80 U/L (ref 38–126)
Anion gap: 7 (ref 5–15)
BUN: 9 mg/dL (ref 6–20)
CO2: 25 mmol/L (ref 22–32)
Calcium: 9.6 mg/dL (ref 8.9–10.3)
Chloride: 106 mmol/L (ref 98–111)
Creatinine, Ser: 0.78 mg/dL (ref 0.44–1.00)
GFR, Estimated: >60 mL/min (ref >60)
Glucose, Bld: 99 mg/dL (ref 70–99)
Potassium: 4.3 mmol/L (ref 3.5–5.1)
Sodium: 138 mmol/L (ref 135–145)
Total Bilirubin: 0.4 mg/dL (ref 0.3–1.2)
Total Protein: 8.4 g/dL — ABNORMAL HIGH (ref 6.5–8.1)

## 2020-06-20 LAB — RESP PANEL BY RT-PCR (FLU A&B, COVID) ARPGX2
Influenza A by PCR: NEGATIVE
Influenza B by PCR: NEGATIVE
SARS Coronavirus 2 by RT PCR: NEGATIVE

## 2020-06-20 LAB — LIPASE, BLOOD: Lipase: 28 U/L (ref 11–51)

## 2020-06-20 LAB — RETICULOCYTES
Immature Retic Fract: 24.1 % — ABNORMAL HIGH (ref 2.3–15.9)
RBC.: 4.58 MIL/uL (ref 3.87–5.11)
Retic Count, Absolute: 62.7 10*3/uL (ref 19.0–186.0)
Retic Ct Pct: 1.4 % (ref 0.4–3.1)

## 2020-06-20 MED ORDER — HYDROMORPHONE HCL 2 MG/ML IJ SOLN
2.0000 mg | INTRAMUSCULAR | Status: AC
  Administered 2020-06-20: 2 mg via INTRAVENOUS
  Filled 2020-06-20: qty 1

## 2020-06-20 MED ORDER — DIPHENHYDRAMINE HCL 50 MG/ML IJ SOLN
25.0000 mg | Freq: Once | INTRAMUSCULAR | Status: AC
  Administered 2020-06-20: 25 mg via INTRAVENOUS
  Filled 2020-06-20: qty 1

## 2020-06-20 MED ORDER — SODIUM CHLORIDE 0.45 % IV SOLN: INTRAVENOUS | Status: DC

## 2020-06-20 MED ORDER — METOCLOPRAMIDE HCL 5 MG/ML IJ SOLN
5.0000 mg | Freq: Once | INTRAMUSCULAR | Status: AC
  Administered 2020-06-20: 5 mg via INTRAVENOUS
  Filled 2020-06-20: qty 2

## 2020-06-20 MED ORDER — SODIUM CHLORIDE (PF) 0.9 % IJ SOLN
INTRAMUSCULAR | Status: AC
  Filled 2020-06-20: qty 50

## 2020-06-20 MED ORDER — ALBUTEROL SULFATE (2.5 MG/3ML) 0.083% IN NEBU: 2.5000 mg | INHALATION_SOLUTION | Freq: Four times a day (QID) | RESPIRATORY_TRACT | 12 refills | Status: DC | PRN

## 2020-06-20 MED ORDER — IOHEXOL 350 MG/ML SOLN
100.0000 mL | Freq: Once | INTRAVENOUS | Status: AC | PRN
  Administered 2020-06-20: 100 mL via INTRAVENOUS

## 2020-06-20 MED ORDER — PROMETHAZINE HCL 25 MG PO TABS
25.0000 mg | ORAL_TABLET | ORAL | Status: DC | PRN
  Filled 2020-06-20: qty 1

## 2020-06-20 NOTE — ED Provider Notes (Signed)
[facility]-EMERGENCY DEPT Provider Note   CSN: [id number] Arrival date & time: 06/20/20  1316     History Chief Complaint  Patient presents with  . Sickle Cell Pain Crisis    Tanya Krause is a 35 y.o. female.  35 year old female with history of sickle cell disease presents with acute pain crisis.  Patient states she is had a cough and feels as if she may be developing a URI.  She is not vaccinated for COVID.  States she had a fever last night of 102.1.  Has had emesis x1 today.  No diarrhea noted.  Has had a cough which is been nonproductive.  Is currently out of her home medications as she states she has between physicians        Past Medical History:  Diagnosis Date  . Asthma   . Chronic abdominal pain   . Chronic migraine   . Chronic pain syndrome   . Fibromyalgia   . 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 Hypertension   . Migraine   . Pelvic inflammatory disease (PID)     Patient Active Problem List   Diagnosis Date Noted  . Insomnia due to medical condition 07/17/2019  . Encounter for long-term use of opiate analgesic 04/08/2019  . Encounter for medication monitoring 04/08/2019  . 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 Trochanteric bursitis of left hip 03/11/2019  . S/P hysterectomy 02/06/2019  . S/P TAH-BSO (total abdominal hysterectomy and bilateral salpingo-oophorectomy) 02/06/2019  . Myofascial pain dysfunction syndrome 09/27/2018  . Chronic bilateral back pain 09/03/2018  . Colitis 08/12/2015  . Oral thrush 11/18/2014  . B12 deficiency 11/17/2014  . Chronic pain syndrome 11/17/2014  . Numbness 11/17/2014  . Intractable headache 11/17/2014  . Intractable chronic migraine without aura and without status migrainosus 11/17/2014  . Migraines 10/11/2014  . Primary fibromyalgia syndrome 10/11/2014  . Lower extremity weakness 10/11/2014  . Vitamin B12 deficiency  10/11/2014  . Anxiety state 10/11/2014  . Nausea & vomiting 10/08/2014  . Headache, tension type, chronic 09/07/2012  . Abdominal pain 06/15/2012    Past Surgical History:  Procedure Laterality Date  . HYSTERECTOMY ABDOMINAL WITH SALPINGO-OOPHORECTOMY Bilateral 02/06/2019   Procedure: HYSTERECTOMY ABDOMINAL WITH BILATERAL SALPINGO-OOPHORECTOMY;  Surgeon: Lazaro Arms, MD;  Location: [facility];  Service: Gynecology;  Laterality: Bilateral;  . kidney infections    . TUBAL LIGATION    . WISDOM TOOTH EXTRACTION       OB History    Gravida  4   Para  4   Term  1   Preterm  3   AB      Living  4     SAB      IAB      Ectopic      Multiple      Live Births  4           Family History  Problem Relation Age of Onset  . Hypertension Mother   . Diabetes Mother   . Heart failure Father   . Colon cancer Paternal Grandfather     Social History   Tobacco Use  . Smoking status: Current Some Day Smoker    Packs/day: 0.14    Years: 5.00    Pack years: 0.70    Types: Cigarettes  . Smokeless tobacco: Never Used  . Tobacco comment: 3 cig daily  Vaping Use  . Vaping Use: Some days  .  Substances: CBD  Substance Use Topics  . Alcohol use: Not Currently  . Drug use: Yes    Types: Marijuana    Comment: last used september 2020    Home Medications Prior to Admission medications   Medication Sig Start Date End Date Taking? Authorizing Provider  acetaminophen (TYLENOL) 500 MG tablet Take 1,000 mg by mouth every 6 (six) hours as needed.    [provider]  albuterol (ACCUNEB) 1.25 MG/3ML nebulizer solution Take 1 ampule by nebulization every 6 (six) hours as needed for wheezing or shortness of breath.  12/03/19   [provider]  busPIRone (BUSPAR) 7.5 MG tablet Take 7.5 mg by mouth 3 (three) times daily. 03/23/20   [provider]  cyclobenzaprine (FLEXERIL) 10 MG tablet TAKE ONE TABLET BY MOUTH THREE TIMES DAILY AS NEEDED FOR MUSCLE  SPASMS. Patient taking differently: Take 10 mg by mouth 3 (three) times daily as needed for muscle spasms. T 09/30/19   Lovorn, Aundra Millet, MD  diclofenac (VOLTAREN) 50 MG EC tablet Take 1 tablet (50 mg total) by mouth 2 (two) times daily as needed (for swelling of hands/tendinitis). 03/20/20   Lovorn, Aundra Millet, MD  diphenhydrAMINE (BENADRYL) 25 MG tablet Take 25 mg by mouth every 6 (six) hours as needed for itching or allergies.    [provider]  EMGALITY 120 MG/ML SOAJ Inject 1 each into the skin every 30 (thirty) days. No matter what- to decrease migraines- 04/24/20   Lovorn, Aundra Millet, MD  estradiol (ESTRACE) 2 MG tablet Take 1 tablet (2 mg total) by mouth daily. 02/07/19   Lazaro Arms, MD  FLOVENT HFA 220 MCG/ACT inhaler Inhale 2 puffs into the lungs 2 (two) times daily. 12/03/19   [provider]  folic acid (FOLVITE) 1 MG tablet Take 1 tablet (1 mg total) by mouth daily. 10/17/14   Jerald Kief, MD  HYDROcodone-acetaminophen (NORCO/VICODIN) 5-325 MG tablet Take 1 tablet by mouth 3 (three) times daily as needed. for pain 06/01/20   Lovorn, Aundra Millet, MD  hydrOXYzine (ATARAX/VISTARIL) 25 MG tablet Take 1 tablet (25 mg total) by mouth every 8 (eight) hours as needed. 06/19/19   Lovorn, Aundra Millet, MD  lidocaine (LIDODERM) 5 % Place 3 patches onto the skin daily. Remove & Discard patch within 12 hours or as directed by MD Patient taking differently: Place 1 patch onto the skin daily. 05/13/19   Lovorn, Aundra Millet, MD  nystatin (MYCOSTATIN) 100000 UNIT/ML suspension TAKE ONE TEASPOONFUL ( ) BY MOUTH FOUR TIMES DAILY 10/10/19   Lovorn, Aundra Millet, MD  pantoprazole (PROTONIX) 40 MG tablet Take 1 tablet (40 mg total) by mouth daily. For reflux/heartburn that's daily. 04/24/20   Lovorn, Aundra Millet, MD  Polyethylene Glycol 3350 (MIRALAX PO) Take 17 g by mouth daily as needed (constipation).     [provider]  promethazine (PHENERGAN) 25 MG tablet Take 25 mg by mouth every 8 (eight) hours as needed for nausea or  vomiting.  11/27/19   [provider]  promethazine (PHENERGAN) 6.25 MG/5ML syrup TAKE TWO TEASPOONSFUL ( ) BY MOUTH FOUR TIMES DAILY AS NEEDED FOR NAUSEA AND VOMITING. 02/18/20   Lovorn, Aundra Millet, MD  SUMAtriptan (IMITREX) 100 MG tablet Take 1 tablet (100 mg total) by mouth every 2 (two) hours as needed for migraine. May repeat in 2 hours if headache persists or recurs. 06/01/20   Lovorn, Aundra Millet, MD  valACYclovir (VALTREX) 1000 MG tablet Take 1 tablet (1,000 mg total) by mouth 3 (three) times daily. 03/31/19   Eustace Moore, MD  venlafaxine (EFFEXOR) 37.5 MG tablet Take 37.5 mg by mouth every morning. 02/20/20   [provider]  zolpidem (AMBIEN) 5 MG tablet TAKE ONE TABLET BY MOUTH AT BEDTIME AS NEEDED FOR SLEEP Patient not taking: No sig reported 03/06/20   Lovorn, Aundra Millet, MD    Allergies    Metoclopramide, Tomato, Aspirin, Doxycycline, Hydromorphone hcl, Ibuprofen, Ondansetron, Other, Shellfish allergy, Toradol [ketorolac tromethamine], Latex, and Tramadol  Review of Systems   Review of Systems  All other systems reviewed and are negative.   Physical Exam Updated Vital Signs BP (!) 127/95   Pulse (!) 108   Temp 98.8 F (37.1 C) (Oral)   Resp 16   LMP 12/04/2018   SpO2 97%   Physical Exam Vitals and nursing note reviewed.  Constitutional:      General: She is not in acute distress.    Appearance: Normal appearance. She is well-developed. She is not toxic-appearing.  HENT:     Head: Normocephalic and atraumatic.  Eyes:     General: Lids are normal.     Conjunctiva/sclera: Conjunctivae normal.     Pupils: Pupils are equal, round, and reactive to light.  Neck:     Thyroid: No thyroid mass.     Trachea: No tracheal deviation.  Cardiovascular:     Rate and Rhythm: Normal rate and regular rhythm.     Heart sounds: Normal heart sounds. No murmur heard. No gallop.   Pulmonary:     Effort: Pulmonary effort is normal. No respiratory distress.     Breath sounds:  Normal breath sounds. No stridor. No decreased breath sounds, wheezing, rhonchi or rales.  Abdominal:     General: Bowel sounds are normal. There is no distension.     Palpations: Abdomen is soft.     Tenderness: There is no abdominal tenderness. There is no rebound.  Musculoskeletal:        General: No tenderness. Normal range of motion.     Cervical back: Normal range of motion and neck supple.  Skin:    General: Skin is warm and dry.     Findings: No abrasion or rash.  Neurological:     Mental Status: She is alert and oriented to person, place, and time.     GCS: GCS eye subscore is 4. GCS verbal subscore is 5. GCS motor subscore is 6.     Cranial Nerves: No cranial nerve deficit.     Sensory: No sensory deficit.  Psychiatric:        Speech: Speech normal.        Behavior: Behavior normal.     ED Results / Procedures / Treatments   Labs (all labs ordered are listed, but only abnormal results are displayed) Labs Reviewed  COMPREHENSIVE METABOLIC PANEL  CBC WITH DIFFERENTIAL/PLATELET  LIPASE, BLOOD  RETICULOCYTES  I-STAT BETA HCG BLOOD, ED ([facility], [facility], [facility] ONLY)    EKG None  Radiology No results found.  Procedures Procedures   Medications Ordered in ED Medications  0.45 % sodium chloride infusion (has no administration in time range)  HYDROmorphone (DILAUDID) injection 2 mg (has no administration in time range)  HYDROmorphone (DILAUDID) injection 2 mg (has no administration in time range)  diphenhydrAMINE (BENADRYL) injection 25 mg (has no administration in time range)  promethazine (PHENERGAN) tablet 25 mg (has no administration in time range)    ED Course  I have reviewed the triage vital signs and the nursing notes.  Pertinent labs & imaging results that were available during  my care of the patient were reviewed by me and considered in my medical decision making (see chart for details).    MDM Rules/Calculators/A&P                          Upon further  researching the patient's old record shows that patient actually has sickle cell trait and not anemia.  Laboratory studies here are reassuring.  She did have some shortness of breath.  CT of the chest was negative for PE.  She is COVID-negative.  Lactate is normal.  Was medicated for pain here.  Patient does have fibromyalgia and experiences chronic pain.  She will follow-up with her local physician. Final Clinical Impression(s) / ED Diagnoses Final diagnoses:  None    Rx / DC Orders ED Discharge Orders    None       Lorre Nick, MD 06/20/20 1731

## 2020-06-20 NOTE — ED Notes (Signed)
Placed on 2 L [place], O2 on RA was mid-80s.

## 2020-06-20 NOTE — ED Triage Notes (Signed)
Patient BIBA from home, c/o [facility]SC x4 days. She reports generalized pain. She has a history of PID and fibromyalgia. She reports taking hydrocodone this morning w/ no relief.

## 2020-06-20 NOTE — ED Provider Notes (Signed)
Emergency Medicine Provider Triage Evaluation Note  Tanya Krause , a 35 y.o. female  was evaluated in triage.  Pt complains of sickle cell pain crisis for 4 days.  Reports pain throughout her entire body including her chest and abdomen.  Reports intermittent fever and vomiting as well.  Reports having coughing spells that will sometimes cause her to be short of breath.  No injuries or falls.  No numbness or weakness.  Review of Systems  Positive: Myalgias, chest pain, cough, vomiting Negative: Numbness, weakness  Physical Exam  BP (!) 127/95   Pulse (!) 108   Temp 98.8 F (37.1 C) (Oral)   Resp 16   LMP 12/04/2018   SpO2 97%  Gen:   Awake, no distress   Resp:  Normal effort  MSK:   Moves extremities without difficulty  Other:  No signs of respiratory distress  Medical Decision Making  Medically screening exam initiated at 1:39 PM.  Appropriate orders placed.  [place] M Bernard was informed that the remainder of the evaluation will be completed by another provider, this initial triage assessment does not replace that evaluation, and the importance of remaining in the ED until their evaluation is complete.  Lab work and imaging ordered   Dietrich Pates, PA-C 06/20/20 1340    Lorre Nick, MD 06/21/20 [phone]

## 2020-06-22 ENCOUNTER — Telehealth (HOSPITAL_BASED_OUTPATIENT_CLINIC_OR_DEPARTMENT_OTHER): Payer: Self-pay | Admitting: Emergency Medicine

## 2020-06-22 LAB — BLOOD CULTURE ID PANEL (REFLEXED) - BCID2

## 2020-06-22 NOTE — Progress Notes (Signed)
Office Visit Note  Patient: Tanya Krause             Date of Birth: 06-Feb-1985           MRN: [id number]             PCP: Laruth Bouchard, MD (Inactive) Referring: Lisbeth Renshaw, MD Visit Date: 07/06/2020 Occupation: @GUAROCC @  Subjective:  Patient all the joints and muscles   History of Present Illness: Tanya Krause is a 35 y.o. female seen in consultation per request of Dr. 31.  According the patient in 2017 she was hospitalized that she could not walk due to numbness and lower back pain.  She states she has had pain in her feet and swelling and throbbing sensation since then.  She was discharged and she has been treated by her PCP and pain management.  She states she gets muscle relaxers and pain medications from there.  She was recently evaluated by neurosurgery for lower back pain.  She was diagnosed with scoliosis and no surgery was advised.  She states she has difficulty walking she states all her joints and muscles lock up.  She describes pain and discomfort in all of her joints and muscles.  She describes discomfort in her cervical spine ,thoracic spine, lumbar spine, shoulders, elbows, wrist joints, hands, hips, knees, ankles and her feet.  She states she has noticed swelling in her hands and her feet.  He states there is possible history of rheumatoid arthritis in her mother and maternal grandmother.  She is gravida 4 and para 4.  She states that she had her blood pressure during her pregnancy and she gave birth to premature babies.  Activities of Daily Living:  Patient reports morning stiffness for all day. Patient Reports nocturnal pain.  Difficulty dressing/grooming: Reports Difficulty climbing stairs: Reports Difficulty getting out of chair: Reports Difficulty using hands for taps, buttons, cutlery, and/or writing: Reports  Review of Systems  Constitutional:  Positive for fatigue.  HENT:  Positive for mouth dryness and nose dryness. Negative for mouth sores.    Eyes:  Negative for pain, itching and dryness.  Respiratory:  Negative for shortness of breath and difficulty breathing.   Cardiovascular:  Negative for chest pain and palpitations.  Gastrointestinal:  Positive for constipation. Negative for blood in stool and diarrhea.  Endocrine: Negative for increased urination.  Genitourinary:  Negative for difficulty urinating.  Musculoskeletal:  Positive for joint pain, joint pain, joint swelling, myalgias, morning stiffness, muscle tenderness and myalgias.  Skin:  Negative for color change, rash, redness and sensitivity to sunlight.  Allergic/Immunologic: Negative for susceptible to infections.  Neurological:  Positive for tremors, numbness, headaches and weakness. Negative for dizziness and memory loss.  Hematological:  Negative for bruising/bleeding tendency and swollen glands.  Psychiatric/Behavioral:  Positive for depressed mood and sleep disturbance. Negative for confusion. The patient is nervous/anxious.    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 04/08/2019   Trochanteric bursitis of left hip 03/11/2019   S/P hysterectomy 02/06/2019   S/P TAH-BSO (total abdominal hysterectomy and bilateral salpingo-oophorectomy) 02/06/2019   Myofascial pain dysfunction syndrome 09/27/2018   Chronic bilateral back pain 09/03/2018   Colitis 08/12/2015   Oral thrush 11/18/2014   B12 deficiency 11/17/2014  Chronic pain syndrome 11/17/2014   Numbness 11/17/2014   Intractable headache 11/17/2014   Intractable chronic migraine without aura and without status migrainosus 11/17/2014   Migraines 10/11/2014   Primary fibromyalgia syndrome 10/11/2014   Lower extremity weakness  10/11/2014   Vitamin B12 deficiency 10/11/2014   Anxiety state 10/11/2014   Nausea & vomiting 10/08/2014   Headache, tension type, chronic 09/07/2012   Abdominal pain 06/15/2012    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   Hypertension    Migraine    Pelvic inflammatory disease (PID)     Family History  Problem Relation Age of Onset   Hypertension Mother    Diabetes Mother    Heart failure Father    Colon cancer Paternal Grandfather    Healthy Daughter    Healthy Son    Healthy Son    Healthy Son    Past Surgical History:  Procedure Laterality Date   HYSTERECTOMY ABDOMINAL WITH SALPINGO-OOPHORECTOMY Bilateral 02/06/2019   Procedure: HYSTERECTOMY ABDOMINAL WITH BILATERAL SALPINGO-OOPHORECTOMY;  Surgeon: Lazaro Arms, MD;  Location: [facility];  Service: Gynecology;  Laterality: Bilateral;   kidney infections     TUBAL LIGATION     WISDOM TOOTH EXTRACTION     Social History   Social History Narrative   Lives with parents, children   Caffeine use- coffee once a day, sodas x 2 a day   Immunization History  Administered Date(s) Administered   Influenza,inj,Quad PF,6+ Mos 10/10/2014   Pneumococcal Polysaccharide-23 10/10/2014     Objective: Vital Signs: BP 119/86 (BP Location: Right Arm, Patient Position: Sitting, Cuff Size: Normal)   Pulse 99   Resp 17   Ht 4' 11.75" (1.518 m)   Wt 174 lb 6.4 oz (79.1 kg)   LMP 12/04/2018   BMI 34.35 kg/m    Physical Exam Vitals and nursing note reviewed.  Constitutional:      Appearance: She is well-developed.  HENT:     Head: Normocephalic and atraumatic.  Eyes:     Conjunctiva/sclera: Conjunctivae normal.  Cardiovascular:     Rate and Rhythm: Normal rate and regular rhythm.     Heart sounds: Normal heart sounds.  Pulmonary:     Effort: Pulmonary effort is normal.     Breath sounds:  Normal breath sounds.  Abdominal:     General: Bowel sounds are normal.     Palpations: Abdomen is soft.  Musculoskeletal:     Cervical back: Normal range of motion.  Lymphadenopathy:     Cervical: No cervical adenopathy.  Skin:    General: Skin is warm and dry.     Capillary Refill: Capillary refill takes less than 2 seconds.  Neurological:     Mental Status: She is alert and oriented to person, place, and time.  Psychiatric:        Behavior: Behavior normal.     Musculoskeletal Exam: Patient had limited range of motion of her cervical spine, thoracic and lumbar spine.  She could not raise her arms above 90 degrees due to discomfort.  Elbow joints and wrist joints were tender but no synovitis was noted.  She has difficulty making a fist and no synovitis was noted over MCPs PIPs or DIPs.  She had painful range of motion of her hip joints knee joints and ankle joints with  no synovitis.  There was no synovitis over ankles or MTPs.  CDAI Exam: CDAI Score: -- Patient Global: --; Provider Global: -- Swollen: --; Tender: -- Joint Exam 07/06/2020   No joint exam has been documented for this visit   There is currently no information documented on the homunculus. Go to the Rheumatology activity and complete the homunculus joint exam.  Investigation: No additional findings.  Imaging: DG Chest 2 View  Result Date: 06/24/2020 CLINICAL DATA:  Fever, generalized weakness EXAM: CHEST - 2 VIEW COMPARISON:  06/20/2020 FINDINGS: The heart size and mediastinal contours are within normal limits. No focal airspace consolidation, pleural effusion, or pneumothorax. the visualized skeletal structures are unremarkable. IMPRESSION: No active cardiopulmonary disease. Electronically Signed   By: Duanne Guess D.O.   On: 06/24/2020 07:56   DG Chest 2 View  Result Date: 06/20/2020 CLINICAL DATA:  Sickle cell disease with chest pain. EXAM: CHEST - 2 VIEW COMPARISON:  January 20, 2020 FINDINGS: Lungs are  clear. Heart is upper normal in size with pulmonary vascularity. No adenopathy. No appreciable bone lesions. IMPRESSION: Lungs clear. Heart upper normal in size. No bony lesions appreciable. Electronically Signed   By: Bretta Bang III M.D.   On: 06/20/2020 14:26   CT Angio Chest PE W/Cm &/Or Wo Cm  Result Date: 06/20/2020 CLINICAL DATA:  PE suspected. Sickle cell crisis for 4 days. Chest and abdominal pain. EXAM: CT ANGIOGRAPHY CHEST WITH CONTRAST TECHNIQUE: Multidetector CT imaging of the chest was performed using the standard protocol during bolus administration of intravenous contrast. Multiplanar CT image reconstructions and MIPs were obtained to evaluate the vascular anatomy. CONTRAST:  OMNIPAQUE IOHEXOL 350 MG/ML SOLN COMPARISON:  Chest CT angiogram dated 06/07/2010. FINDINGS: Cardiovascular: Some of the most peripheral segmental and subsegmental pulmonary arteries are difficult to definitively characterize due to patient breathing motion artifact, however, there is no pulmonary embolism identified within the main, lobar or segmental pulmonary arteries bilaterally. No thoracic aortic aneurysm or evidence of aortic dissection. No significant pericardial effusion. Mediastinum/Nodes: No mass or enlarged lymph nodes are seen within the mediastinum or perihilar regions. Esophagus is unremarkable. Trachea and central bronchi are unremarkable. Lungs/Pleura: Mild bibasilar atelectasis and air trapping. Lungs otherwise clear. No evidence of pneumonia. No pleural effusion or pneumothorax. Upper Abdomen: Limited images of the upper abdomen are unremarkable. Musculoskeletal: No acute appearing osseous abnormality. Mild scoliosis of the thoracic spine. Review of the MIP images confirms the above findings. IMPRESSION: 1. No acute findings. No pulmonary embolism seen, with mild study limitations detailed above. No evidence of pneumonia or pulmonary edema. 2. Mild bibasilar atelectasis and air trapping.  Electronically Signed   By: Bary Richard M.D.   On: 06/20/2020 16:53   XR Hand 2 View Left  Result Date: 07/06/2020 No MCP, PIP, DIP, CMC, intercarpal or radiocarpal joint space narrowing was noted.  No erosive changes were noted. Impression: Unremarkable x-ray of the hand.  XR Hand 2 View Right  Result Date: 07/06/2020 No MCP, PIP, DIP, CMC, intercarpal or radiocarpal joint space narrowing was noted.  No erosive changes were noted. Impression: Unremarkable x-ray of the hand.    Recent Labs: Lab Results  Component Value Date   WBC 10.3 06/25/2020   HGB 14.1 06/25/2020   PLT 278 06/25/2020   NA 138 06/25/2020   K 4.4 06/25/2020   CL 107 06/25/2020   CO2 23 06/25/2020   GLUCOSE 90 06/25/2020   BUN 13 06/25/2020   CREATININE 0.92 06/25/2020   BILITOT 0.2 (  L) 06/25/2020   ALKPHOS 69 06/25/2020   AST 19 06/25/2020   ALT 14 06/25/2020   PROT 7.9 06/25/2020   ALBUMIN 4.2 06/25/2020   CALCIUM 9.5 06/25/2020   GFRAA >60 02/07/2019    Speciality Comments: No specialty comments available.  Procedures:  No procedures performed Allergies: Metoclopramide, Tomato, Aspirin, Doxycycline, Hydromorphone hcl, Ibuprofen, Ondansetron, Other, Shellfish allergy, Toradol [ketorolac tromethamine], Latex, and Tramadol   Assessment / Plan:     Visit Diagnoses: Polyarthralgia-patient complains of pain and discomfort in all of her joints and her muscles.  She states that she has noticed swelling in her hands and her feet at times.  Pain in both hands -she had tenderness on palpation of her hands over the joints and also in the mid joint line.  No synovitis was noted.  Plan: XR Hand 2 View Right, XR Hand 2 View Left, x-rays of bilateral hands were unremarkable.  X-ray findings were discussed with the patient.  A handout on hand exercises was given sedimentation rate, Rheumatoid factor, Cyclic citrul peptide antibody, IgG.  I will contact her with the lab results once the lab results are  available.  Neck pain-she complains of pain and discomfort in her neck.  I reviewed x-rays of her cervical spine and also MRI results.  MRI showed spinal stenosis at C5-6 and C6-7.  Chronic midline low back pain without sciatica-she has chronic lower back pain and discomfort.  She had MRI of her lumbar spine in the past and the thoracic spine which were unremarkable.  She has been evaluated by neurosurgery and nothing was offered.  Chronic pain syndrome-she lives a lot of pain and discomfort.  She has been going to pain management.  She gets her medications.  Fibromyalgia-she has generalized hyperalgesia and positive tender points.  She has difficulty with mobility and walks with the help of a walker.  She states she has a caregiver 7 days a week to help her with her routine activities.  Trochanteric bursitis of left hip-she had tenderness over bilateral trochanteric bursa.  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   Follow-Up Instructions: Return for Pain in multiple joints and muscles.   Pollyann Savoy, MD  Note - This record has been created using Animal nutritionist.  Chart creation errors have been sought, but may not always  have been located. Such creation errors do not reflect on  the standard of medical care.

## 2020-06-23 ENCOUNTER — Other Ambulatory Visit: Payer: Self-pay

## 2020-06-23 ENCOUNTER — Emergency Department (HOSPITAL_COMMUNITY)
Admission: EM | Admit: 2020-06-23 | Discharge: 2020-06-24 | Disposition: A | Discharge status: Home / Self Care | Payer: Medicaid Other | Attending: Emergency Medicine | Admitting: Emergency Medicine

## 2020-06-23 DIAGNOSIS — Z79899 Other long term (current) drug therapy: Secondary | ICD-10-CM | POA: Diagnosis not present

## 2020-06-23 DIAGNOSIS — B349 Viral infection, unspecified: Secondary | ICD-10-CM | POA: Diagnosis not present

## 2020-06-23 DIAGNOSIS — J45909 Unspecified asthma, uncomplicated: Secondary | ICD-10-CM | POA: Diagnosis not present

## 2020-06-23 DIAGNOSIS — Z7951 Long term (current) use of inhaled steroids: Secondary | ICD-10-CM | POA: Diagnosis not present

## 2020-06-23 DIAGNOSIS — D72829 Elevated white blood cell count, unspecified: Secondary | ICD-10-CM | POA: Insufficient documentation

## 2020-06-23 DIAGNOSIS — R519 Headache, unspecified: Secondary | ICD-10-CM | POA: Diagnosis present

## 2020-06-23 DIAGNOSIS — I10 Essential (primary) hypertension: Secondary | ICD-10-CM | POA: Insufficient documentation

## 2020-06-23 DIAGNOSIS — Z91040 Latex allergy status: Secondary | ICD-10-CM | POA: Insufficient documentation

## 2020-06-23 DIAGNOSIS — F17210 Nicotine dependence, cigarettes, uncomplicated: Secondary | ICD-10-CM | POA: Diagnosis not present

## 2020-06-23 LAB — COMPREHENSIVE METABOLIC PANEL
ALT: 14 U/L (ref 0–44)
AST: 21 U/L (ref 15–41)
Albumin: 4 g/dL (ref 3.5–5.0)
Alkaline Phosphatase: 76 U/L (ref 38–126)
Anion gap: 12 (ref 5–15)
BUN: 11 mg/dL (ref 6–20)
CO2: 19 mmol/L — ABNORMAL LOW (ref 22–32)
Calcium: 9.6 mg/dL (ref 8.9–10.3)
Chloride: 105 mmol/L (ref 98–111)
Creatinine, Ser: 0.9 mg/dL (ref 0.44–1.00)
GFR, Estimated: >60 mL/min (ref >60)
Glucose, Bld: 82 mg/dL (ref 70–99)
Potassium: 5.1 mmol/L (ref 3.5–5.1)
Sodium: 136 mmol/L (ref 135–145)
Total Bilirubin: 0.5 mg/dL (ref 0.3–1.2)
Total Protein: 7.7 g/dL (ref 6.5–8.1)

## 2020-06-23 LAB — CBC WITH DIFFERENTIAL/PLATELET
Abs Immature Granulocytes: 0.07 10*3/uL (ref 0.00–0.07)
Basophils Absolute: 0.1 10*3/uL (ref 0.0–0.1)
Basophils Relative: 1 %
Eosinophils Absolute: 0.2 10*3/uL (ref 0.0–0.5)
Eosinophils Relative: 2 %
HCT: 40 % (ref 36.0–46.0)
Hemoglobin: 13.8 g/dL (ref 12.0–15.0)
Immature Granulocytes: 1 %
Lymphocytes Relative: 37 %
Lymphs Abs: 4.4 10*3/uL — ABNORMAL HIGH (ref 0.7–4.0)
MCH: 29 pg (ref 26.0–34.0)
MCHC: 34.5 g/dL (ref 30.0–36.0)
MCV: 84 fL (ref 80.0–100.0)
Monocytes Absolute: 0.8 10*3/uL (ref 0.1–1.0)
Monocytes Relative: 7 %
Neutro Abs: 6.2 10*3/uL (ref 1.7–7.7)
Neutrophils Relative %: 52 %
Platelets: 311 10*3/uL (ref 150–400)
RBC: 4.76 MIL/uL (ref 3.87–5.11)
RDW: 13.1 % (ref 11.5–15.5)
WBC: 11.7 10*3/uL — ABNORMAL HIGH (ref 4.0–10.5)
nRBC: 0 % (ref 0.0–0.2)

## 2020-06-23 LAB — CULTURE, BLOOD (ROUTINE X 2): Special Requests: ADEQUATE

## 2020-06-23 LAB — URINALYSIS, ROUTINE W REFLEX MICROSCOPIC
Bilirubin Urine: NEGATIVE
Glucose, UA: NEGATIVE mg/dL
Hgb urine dipstick: NEGATIVE
Ketones, ur: NEGATIVE mg/dL
Leukocytes,Ua: NEGATIVE
Nitrite: NEGATIVE
Protein, ur: NEGATIVE mg/dL
Specific Gravity, Urine: 1.019 (ref 1.005–1.030)
pH: 6 (ref 5.0–8.0)

## 2020-06-23 LAB — LACTIC ACID, PLASMA: Lactic Acid, Venous: 0.9 mmol/L (ref 0.5–1.9)

## 2020-06-23 NOTE — ED Provider Notes (Signed)
Emergency Medicine Provider Triage Evaluation Note  Tanya Krause , a 35 y.o. female  was evaluated in triage.  Pt complains of being called for positive blood cultures. She states that for about 2 weeks she has had generalized weakness, headache, intermittent fevers, nausea vomiting and feeling poorly. She was seen at [facility] for the same and had questionable positive blood cultures.   Component 3 d ago  Specimen Description BLOOD LEFT ANTECUBITAL  Performed at [facility], [address] [place], [place] [address]   Special Requests BOTTLES DRAWN AEROBIC AND ANAEROBIC Blood Culture adequate volume  Performed at [facility], [address], [place], [place] [address]   Culture Setup Time GRAM POSITIVE COCCI  ANAEROBIC BOTTLE ONLY  CRITICAL RESULT CALLED TO, READ BACK BY AND VERIFIED WITH:  Tanya Krause., RN 06/22/2020 AT 0541 A.HUGHES   Culture MICROCOCCUS LETEUS  THE SIGNIFICANCE OF ISOLATING THIS ORGANISM FROM A SINGLE SET OF BLOOD CULTURES WHEN MULTIPLE SETS ARE DRAWN IS UNCERTAIN. PLEASE NOTIFY THE MICROBIOLOGY DEPARTMENT WITHIN ONE WEEK IF SPECIATION AND SENSITIVITIES ARE REQUIRED.  Performed at [facility], [address], [place], [place] [address]   Report Status 06/23/2020 FINAL      Review of Systems  Positive: fevers Negative: Chest pain  Physical Exam  BP (!) 146/104 (BP Location: Right Arm)   Pulse 70   Temp 99.1 F (37.3 C)   Resp 16   LMP 12/04/2018   SpO2 97%  Gen:   Awake, no distress   Resp:  Normal effort  MSK:   Moves extremities without difficulty  Other:  Patient is awake and alert.  Speech is nonslurred.  Medical Decision Making  Medically screening exam initiated at 5:02 PM.  Appropriate orders placed.  Tanya Krause was informed that the remainder of the evaluation will be completed by another provider, this initial triage assessment does not replace that evaluation, and the importance of remaining in  the ED until their evaluation is complete.     Tanya Gong, PA-C 06/23/20 1707    Tanya Norfolk, DO 06/23/20 1928

## 2020-06-23 NOTE — ED Triage Notes (Signed)
Pt reports two weeks of generalized weakness, headache, intermittent fevers, and n/v. Reports being seen at [facility] for same and then was called this morning and told she had positive blood cultures and to come to ED.

## 2020-06-24 ENCOUNTER — Emergency Department (HOSPITAL_COMMUNITY): Payer: Medicaid Other

## 2020-06-24 ENCOUNTER — Telehealth: Payer: Self-pay | Admitting: Emergency Medicine

## 2020-06-24 ENCOUNTER — Encounter (HOSPITAL_COMMUNITY): Payer: Medicaid Other | Admitting: Occupational Therapy

## 2020-06-24 ENCOUNTER — Telehealth (HOSPITAL_BASED_OUTPATIENT_CLINIC_OR_DEPARTMENT_OTHER): Payer: Self-pay | Admitting: *Deleted

## 2020-06-24 LAB — LIPASE, BLOOD: Lipase: 33 U/L (ref 11–51)

## 2020-06-24 MED ORDER — DIPHENHYDRAMINE HCL 25 MG PO CAPS
25.0000 mg | ORAL_CAPSULE | Freq: Once | ORAL | Status: AC
  Administered 2020-06-24: 25 mg via ORAL
  Filled 2020-06-24: qty 1

## 2020-06-24 MED ORDER — ACETAMINOPHEN 500 MG PO TABS
500.0000 mg | ORAL_TABLET | Freq: Once | ORAL | Status: AC
  Administered 2020-06-24: 500 mg via ORAL
  Filled 2020-06-24: qty 1

## 2020-06-24 MED ORDER — HYDROMORPHONE HCL 1 MG/ML IJ SOLN
1.0000 mg | Freq: Once | INTRAMUSCULAR | Status: AC
  Administered 2020-06-24: 1 mg via INTRAVENOUS
  Filled 2020-06-24: qty 1

## 2020-06-24 MED ORDER — HYDROMORPHONE HCL 1 MG/ML IJ SOLN
0.5000 mg | Freq: Once | INTRAMUSCULAR | Status: AC
  Administered 2020-06-24: 0.5 mg via INTRAVENOUS
  Filled 2020-06-24: qty 1

## 2020-06-24 MED ORDER — ONDANSETRON HCL 4 MG/2ML IJ SOLN
INTRAMUSCULAR | Status: AC
  Administered 2020-06-24: 4 mg
  Filled 2020-06-24: qty 2

## 2020-06-24 NOTE — ED Notes (Addendum)
Pt reports two weeks of generalized weakness, headache, intermittent fevers, and nausea. IV established. lipase drawn, pt to xray. Remains alert, NAD, calm, interactive, resps e/u.

## 2020-06-24 NOTE — Telephone Encounter (Signed)
Pt at [facility]  Called stating that [facility] was waiting for [place] to call results to them,  Explained to pt that they could see all results in epic,  [facility] staff came into to pt room while I was on phone with her and explained to her that they were waiting on results from blood that they had drawn  Told pt they they would continue with follow up with her blood results

## 2020-06-24 NOTE — ED Notes (Signed)
Reports pain unchanged. Wants to take her usual migraine medicine, mentions fiorcet and amitriptyline.

## 2020-06-24 NOTE — ED Provider Notes (Addendum)
[facility] EMERGENCY DEPARTMENT Provider Note   CSN: [id number] Arrival date & time: 06/23/20  1553     History Chief Complaint  Patient presents with  . Abnormal Lab    Tanya Krause is a 35 y.o. female 35 year old female history of chronic pain, fibromyalgia, migraines, hypertension.  Patient reports that she received a call from her PCP yesterday telling her to go to the emergency department for evaluation of a positive blood culture.  Patient reports she was seen in the ER on Sunday for fatigue, headache, intermittent fevers, nausea, intermittent nonbloody/nonbilious emesis.  She reports that she was discharged after treatment and her symptoms have not worsened but she still has not feeling 100% better.  Patient denies fevers today, she denies vision changes, neck stiffness, sore throat, chest pain/shortness of breath, abdominal pain, vomiting today, diarrhea, extremity swelling/color change, fall/injury or any additional concerns. HPI     Past Medical History:  Diagnosis Date  . Asthma   . Chronic abdominal pain   . Chronic migraine   . Chronic pain syndrome   . Fibromyalgia   . 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 Hypertension   . Migraine   . Pelvic inflammatory disease (PID)     Patient Active Problem List   Diagnosis Date Noted  . Insomnia due to medical condition 07/17/2019  . Encounter for long-term use of opiate analgesic 04/08/2019  . Encounter for medication monitoring 04/08/2019  . 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 Trochanteric bursitis of left hip 03/11/2019  . S/P hysterectomy 02/06/2019  . S/P TAH-BSO (total abdominal hysterectomy and bilateral salpingo-oophorectomy) 02/06/2019  . Myofascial pain dysfunction syndrome 09/27/2018  . Chronic bilateral back pain 09/03/2018  . Colitis 08/12/2015  . Oral thrush 11/18/2014  . B12 deficiency 11/17/2014  .  Chronic pain syndrome 11/17/2014  . Numbness 11/17/2014  . Intractable headache 11/17/2014  . Intractable chronic migraine without aura and without status migrainosus 11/17/2014  . Migraines 10/11/2014  . Primary fibromyalgia syndrome 10/11/2014  . Lower extremity weakness 10/11/2014  . Vitamin B12 deficiency 10/11/2014  . Anxiety state 10/11/2014  . Nausea & vomiting 10/08/2014  . Headache, tension type, chronic 09/07/2012  . Abdominal pain 06/15/2012    Past Surgical History:  Procedure Laterality Date  . HYSTERECTOMY ABDOMINAL WITH SALPINGO-OOPHORECTOMY Bilateral 02/06/2019   Procedure: HYSTERECTOMY ABDOMINAL WITH BILATERAL SALPINGO-OOPHORECTOMY;  Surgeon: Lazaro Arms, MD;  Location: [facility];  Service: Gynecology;  Laterality: Bilateral;  . kidney infections    . TUBAL LIGATION    . WISDOM TOOTH EXTRACTION       OB History    Gravida  4   Para  4   Term  1   Preterm  3   AB      Living  4     SAB      IAB      Ectopic      Multiple      Live Births  4           Family History  Problem Relation Age of Onset  . Hypertension Mother   . Diabetes Mother   . Heart failure Father   . Colon cancer Paternal Grandfather     Social History   Tobacco Use  . Smoking status: Current Some Day Smoker    Packs/day: 0.14    Years: 5.00    Pack years: 0.70    Types: Cigarettes  .  Smokeless tobacco: Never Used  . Tobacco comment: 3 cig daily  Vaping Use  . Vaping Use: Some days  . Substances: CBD  Substance Use Topics  . Alcohol use: Not Currently  . Drug use: Yes    Types: Marijuana    Comment: last used september 2020    Home Medications Prior to Admission medications   Medication Sig Start Date End Date Taking? Authorizing Provider  acetaminophen (TYLENOL) 500 MG tablet Take 1,000 mg by mouth every 6 (six) hours as needed.    [provider]  albuterol (ACCUNEB) 1.25 MG/3ML nebulizer solution Take 1 ampule by nebulization every 6 (six)  hours as needed for wheezing or shortness of breath.  12/03/19   [provider]  albuterol (PROVENTIL) (2.5 MG/3ML) 0.083% nebulizer solution Take 3 mLs (2.5 mg total) by nebulization every 6 (six) hours as needed for wheezing or shortness of breath. 06/20/20   Lorre Nick, MD  busPIRone (BUSPAR) 7.5 MG tablet Take 7.5 mg by mouth 3 (three) times daily. 03/23/20   [provider]  cyclobenzaprine (FLEXERIL) 10 MG tablet TAKE ONE TABLET BY MOUTH THREE TIMES DAILY AS NEEDED FOR MUSCLE SPASMS. Patient taking differently: Take 10 mg by mouth 3 (three) times daily as needed for muscle spasms. T 09/30/19   Lovorn, Aundra Millet, MD  diclofenac (VOLTAREN) 50 MG EC tablet Take 1 tablet (50 mg total) by mouth 2 (two) times daily as needed (for swelling of hands/tendinitis). 03/20/20   Lovorn, Aundra Millet, MD  diphenhydrAMINE (BENADRYL) 25 MG tablet Take 25 mg by mouth every 6 (six) hours as needed for itching or allergies.    [provider]  EMGALITY 120 MG/ML SOAJ Inject 1 each into the skin every 30 (thirty) days. No matter what- to decrease migraines- 04/24/20   Lovorn, Aundra Millet, MD  estradiol (ESTRACE) 2 MG tablet Take 1 tablet (2 mg total) by mouth daily. 02/07/19   Lazaro Arms, MD  FLOVENT HFA 220 MCG/ACT inhaler Inhale 2 puffs into the lungs 2 (two) times daily. 12/03/19   [provider]  folic acid (FOLVITE) 1 MG tablet Take 1 tablet (1 mg total) by mouth daily. 10/17/14   Jerald Kief, MD  HYDROcodone-acetaminophen (NORCO/VICODIN) 5-325 MG tablet Take 1 tablet by mouth 3 (three) times daily as needed. for pain 06/01/20   Lovorn, Aundra Millet, MD  hydrOXYzine (ATARAX/VISTARIL) 25 MG tablet Take 1 tablet (25 mg total) by mouth every 8 (eight) hours as needed. 06/19/19   Lovorn, Aundra Millet, MD  lidocaine (LIDODERM) 5 % Place 3 patches onto the skin daily. Remove & Discard patch within 12 hours or as directed by MD Patient taking differently: Place 1 patch onto the skin daily. 05/13/19   Lovorn, Aundra Millet,  MD  nystatin (MYCOSTATIN) 100000 UNIT/ML suspension TAKE ONE TEASPOONFUL ( ) BY MOUTH FOUR TIMES DAILY 10/10/19   Lovorn, Aundra Millet, MD  pantoprazole (PROTONIX) 40 MG tablet Take 1 tablet (40 mg total) by mouth daily. For reflux/heartburn that's daily. 04/24/20   Lovorn, Aundra Millet, MD  Polyethylene Glycol 3350 (MIRALAX PO) Take 17 g by mouth daily as needed (constipation).     [provider]  promethazine (PHENERGAN) 25 MG tablet Take 25 mg by mouth every 8 (eight) hours as needed for nausea or vomiting.  11/27/19   [provider]  promethazine (PHENERGAN) 6.25 MG/5ML syrup TAKE TWO TEASPOONSFUL ( ) BY MOUTH FOUR TIMES DAILY AS NEEDED FOR NAUSEA AND VOMITING. 02/18/20   Lovorn, Aundra Millet, MD  SUMAtriptan (IMITREX) 100 MG tablet Take 1  tablet (100 mg total) by mouth every 2 (two) hours as needed for migraine. May repeat in 2 hours if headache persists or recurs. 06/01/20   Lovorn, Aundra Millet, MD  valACYclovir (VALTREX) 1000 MG tablet Take 1 tablet (1,000 mg total) by mouth 3 (three) times daily. 03/31/19   Eustace Moore, MD  venlafaxine (EFFEXOR) 37.5 MG tablet Take 37.5 mg by mouth every morning. 02/20/20   [provider]  zolpidem (AMBIEN) 5 MG tablet TAKE ONE TABLET BY MOUTH AT BEDTIME AS NEEDED FOR SLEEP Patient not taking: No sig reported 03/06/20   Lovorn, Aundra Millet, MD    Allergies    Metoclopramide, Tomato, Aspirin, Doxycycline, Hydromorphone hcl, Ibuprofen, Ondansetron, Other, Shellfish allergy, Toradol [ketorolac tromethamine], Latex, and Tramadol  Review of Systems   Review of Systems Ten systems are reviewed and are negative for acute change except as noted in the HPI  Physical Exam Updated Vital Signs BP 113/88 (BP Location: Right Arm)   Pulse 95   Temp 98.7 F (37.1 C) (Oral)   Resp 18   LMP 12/04/2018   SpO2 95%   Physical Exam Constitutional:      General: She is not in acute distress.    Appearance: Normal appearance. She is well-developed. She is not  ill-appearing or diaphoretic.  HENT:     Head: Normocephalic and atraumatic.     Mouth/Throat:     Mouth: Mucous membranes are moist.     Pharynx: Oropharynx is clear.  Eyes:     General: Vision grossly intact. Gaze aligned appropriately.     Pupils: Pupils are equal, round, and reactive to light.  Neck:     Trachea: Trachea and phonation normal.     Meningeal: Brudzinski's sign absent.  Cardiovascular:     Rate and Rhythm: Normal rate and regular rhythm.  Pulmonary:     Effort: Pulmonary effort is normal. No respiratory distress.     Breath sounds: Normal breath sounds.  Abdominal:     General: There is no distension.     Palpations: Abdomen is soft.     Tenderness: There is no abdominal tenderness. There is no guarding or rebound.  Musculoskeletal:        General: Normal range of motion.     Cervical back: Normal range of motion. No spinous process tenderness or muscular tenderness.  Skin:    General: Skin is warm and dry.  Neurological:     Mental Status: She is alert.     GCS: GCS eye subscore is 4. GCS verbal subscore is 5. GCS motor subscore is 6.     Comments: Speech is clear and goal oriented, follows commands Major Cranial nerves without deficit, no facial droop Moves extremities without ataxia, coordination intact  Psychiatric:        Behavior: Behavior normal.     ED Results / Procedures / Treatments   Labs (all labs ordered are listed, but only abnormal results are displayed) Labs Reviewed  CBC WITH DIFFERENTIAL/PLATELET - Abnormal; Notable for the following components:      Result Value   WBC 11.7 (*)    Lymphs Abs 4.4 (*)    All other components within normal limits  COMPREHENSIVE METABOLIC PANEL - Abnormal; Notable for the following components:   CO2 19 (*)    All other components within normal limits  URINALYSIS, ROUTINE W REFLEX MICROSCOPIC - Abnormal; Notable for the following components:   APPearance HAZY (*)    All other components within normal  limits  CULTURE, BLOOD (ROUTINE X 2)  CULTURE, BLOOD (ROUTINE X 2)  URINE CULTURE  LACTIC ACID, PLASMA  LIPASE, BLOOD    EKG None  Radiology DG Chest 2 View  Result Date: 06/24/2020 CLINICAL DATA:  Fever, generalized weakness EXAM: CHEST - 2 VIEW COMPARISON:  06/20/2020 FINDINGS: The heart size and mediastinal contours are within normal limits. No focal airspace consolidation, pleural effusion, or pneumothorax. the visualized skeletal structures are unremarkable. IMPRESSION: No active cardiopulmonary disease. Electronically Signed   By: Duanne Guess D.O.   On: 06/24/2020 07:56    Procedures Procedures   Medications Ordered in ED Medications  HYDROmorphone (DILAUDID) injection 0.5 mg (0.5 mg Intravenous Given 06/24/20 0853)  diphenhydrAMINE (BENADRYL) capsule 25 mg (25 mg Oral Given 06/24/20 0938)  ondansetron (ZOFRAN) 4 MG/2ML injection (4 mg  Given 06/24/20 0939)  HYDROmorphone (DILAUDID) injection 1 mg (1 mg Intravenous Given 06/24/20 1050)    ED Course  I have reviewed the triage vital signs and the nursing notes.  Pertinent labs & imaging results that were available during my care of the patient were reviewed by me and considered in my medical decision making (see chart for details).  Clinical Course as of 06/24/20 1129  Wed Jun 24, 2020  0910 Dr. Luciana Axe [BM]    Clinical Course User Index [BM] Elizabeth Palau   MDM Rules/Calculators/A&P                         Additional history obtained from: 1. Nursing notes from this visit. 2. Review of electronic medical records.  Patient seen in the ER on 06/20/2020, diagnosis chronic pain syndrome.  At that time she presented with acute pain crisis reported cough and URI-like symptoms including fever and emesis.  Patient has sickle cell trait, not sickle cell anemia.  She underwent CT PE study which was negative for acute finding.  COVID test was negative.  Lactic was normal.  CBC was within normal limits.  Blood cultures were  drawn.  Patient was discharged.  It appears that yesterday patient was informed that her blood cultures grew out positive and she need further evaluation in the emergency department, it appears that a single set of blood cultures of the 2 that were drawn grew out micrococcus leteus, a gram-positive cocci from the anaerobic bottle only. ------------------------------------- Patient arrived to the ER yesterday around 15 hours prior to my initial evaluation.  Patient had repeat labs drawn including another 2 sets of blood cultures.  I reviewed and interpreted labs which include: Urinalysis without evidence of infection. Lipase within normal limits. Lactic acid within normal limits. CBC shows mild leukocytosis of 11.7, otherwise within normal limits.   CMP without emergent electrolyte derangement, AKI, LFT elevations or gap.  Bicarb 19, patient without history of diabetes, normal gap, doubt DKA.  Possibly due to some dehydration, patient without respiratory symptoms at this time.  Repeat blood cultures drawn yesterday show no growth after 12 hours.  CXR:  IMPRESSION:  No active cardiopulmonary disease.  ------------- Consult was called to infectious disease specialist Dr. Luciana Axe who has reviewed patient's blood cultures and case.  Dr. Luciana Axe advises this is likely a contaminant and does not feel antibiotics are indicated, patient likely experiencing viral illness. - Patient reassessed she is sleeping comfortably in bed no acute distress.  Advised patient on infectious disease recommendations and she states understanding.  I also suspect patient may be experiencing a mild viral illness at this time, COVID/influenza  panel were negative earlier this week, no indication for repeat testing at this time.  Additionally patient's chronic pain may be playing a part in her presentation today.  Patient was given pain medication and reports symptoms have improved, I advised patient she cannot drive or perform any  against activities after receiving pain medication today, she inquired about additional pain medication to go home with however I encouraged her to instead follow-up with her pain management specialist which she agreed with. - At discharge patient reports that while sitting in the hallway throughout her visit she has developed mild headache from the loud noises and bright lights.  Patient was given apologies for being in the hallway.  She requested amitriptyline which she takes at home for her migraines however given this may need to come from pharmacy I discussed trying patient with some Tylenol and allowing her to go home and get some rest in a quiet place which she agreed with.  Cranial nerves intact on exam, no meningismus.  Low suspicion for sepsis, meningitis, encephalitis, SAH or other emergent pathologies at this time.  Patient reports history of migraines and this is consistent with her normal migraines without abnormal features today, no indication for imaging or further work-up at this time.   At this time there does not appear to be any evidence of an acute emergency medical condition and the patient appears stable for discharge with appropriate outpatient follow up. Diagnosis was discussed with patient who verbalizes understanding of care plan and is agreeable to discharge. I have discussed return precautions with patient who verbalizes understanding. Patient encouraged to follow-up with their PCP. All questions answered.  Patient's case discussed with Dr. Charm Barges who agrees with plan to discharge with follow-up.   Note: Portions of this report may have been transcribed using voice recognition software. Every effort was made to ensure accuracy; however, inadvertent computerized transcription errors may still be present.  Final Clinical Impression(s) / ED Diagnoses Final diagnoses:  Viral illness    Rx / DC Orders ED Discharge Orders    None       Bill Salinas, PA-C 06/24/20  1130    Elizabeth Palau 06/24/20 1148    Terrilee Files, MD 06/24/20 2112

## 2020-06-24 NOTE — Telephone Encounter (Signed)
Post ED Visit - Positive Culture Follow-up: Successful Patient Follow-Up  Culture assessed and recommendations reviewed by:  []  , Pharm.D. []  Enzo Bi, Pharm.D., BCPS AQ-ID []  , Pharm.D., BCPS []  Celedonio Miyamoto, Pharm.D., BCPS []  [place], Garvin Fila.D., BCPS, AAHIVP []  , Pharm.D., BCPS, AAHIVP []  Georgina Pillion, PharmD, BCPS []  , PharmD, BCPS []  [place], PharmD, BCPS []  [address], PharmD  Positive blood culture  []  Patient discharged without antimicrobial prescription and treatment is now indicated []  Organism is resistant to prescribed ED discharge antimicrobial [x]  Patient with positive blood cultures  Patient is currently a patient in the [facility] ED being evaluated     Tanya Krause 06/24/2020, 9:55 AM

## 2020-06-24 NOTE — Discharge Instructions (Addendum)
At this time there does not appear to be the presence of an emergent medical condition, however there is always the potential for conditions to change. Please read and follow the below instructions.  Please return to the Emergency Department immediately for any new or worsening symptoms. Please be sure to follow up with your Primary Care Provider within one week regarding your visit today; please call their office to schedule an appointment even if you are feeling better for a follow-up visit. You received pain medication in the ER today.  Do not drive, drink alcohol or perform any potentially dangerous activities for the rest of the day.  Please follow-up closely with your primary care provider.  Please drink plenty water and get plenty of rest.  Go to the nearest Emergency Department immediately if: You have fever or chills Trouble breathing. A severe headache or a stiff neck. Severe vomiting or pain in your abdomen. You have chest pain You have any new/concerning or worsening of symptoms.  Please read the additional information packets attached to your discharge summary.  Do not take your medicine if  develop an itchy rash, swelling in your mouth or lips, or difficulty breathing; call 911 and seek immediate emergency medical attention if this occurs.  You may review your lab tests and imaging results in their entirety on your MyChart account.  Please discuss all results of fully with your primary care provider and other specialist at your follow-up visit.  Note: Portions of this text may have been transcribed using voice recognition software. Every effort was made to ensure accuracy; however, inadvertent computerized transcription errors may still be present.

## 2020-06-25 ENCOUNTER — Emergency Department (HOSPITAL_COMMUNITY)
Admission: EM | Admit: 2020-06-25 | Discharge: 2020-06-25 | Disposition: A | Discharge status: Home / Self Care | Payer: Medicaid Other | Attending: Emergency Medicine | Admitting: Emergency Medicine

## 2020-06-25 ENCOUNTER — Encounter (HOSPITAL_COMMUNITY): Payer: Self-pay

## 2020-06-25 ENCOUNTER — Other Ambulatory Visit: Payer: Self-pay

## 2020-06-25 DIAGNOSIS — F17210 Nicotine dependence, cigarettes, uncomplicated: Secondary | ICD-10-CM | POA: Insufficient documentation

## 2020-06-25 DIAGNOSIS — Z7951 Long term (current) use of inhaled steroids: Secondary | ICD-10-CM | POA: Diagnosis not present

## 2020-06-25 DIAGNOSIS — J029 Acute pharyngitis, unspecified: Secondary | ICD-10-CM | POA: Insufficient documentation

## 2020-06-25 DIAGNOSIS — I10 Essential (primary) hypertension: Secondary | ICD-10-CM | POA: Diagnosis not present

## 2020-06-25 DIAGNOSIS — R5383 Other fatigue: Secondary | ICD-10-CM | POA: Insufficient documentation

## 2020-06-25 DIAGNOSIS — J45909 Unspecified asthma, uncomplicated: Secondary | ICD-10-CM | POA: Diagnosis not present

## 2020-06-25 DIAGNOSIS — Z91040 Latex allergy status: Secondary | ICD-10-CM | POA: Insufficient documentation

## 2020-06-25 DIAGNOSIS — R1084 Generalized abdominal pain: Secondary | ICD-10-CM | POA: Insufficient documentation

## 2020-06-25 LAB — CBC WITH DIFFERENTIAL/PLATELET
Abs Immature Granulocytes: 0.04 10*3/uL (ref 0.00–0.07)
Basophils Absolute: 0.1 10*3/uL (ref 0.0–0.1)
Basophils Relative: 1 %
Eosinophils Absolute: 0.3 10*3/uL (ref 0.0–0.5)
Eosinophils Relative: 3 %
HCT: 41.3 % (ref 36.0–46.0)
Hemoglobin: 14.1 g/dL (ref 12.0–15.0)
Immature Granulocytes: 0 %
Lymphocytes Relative: 37 %
Lymphs Abs: 3.8 10*3/uL (ref 0.7–4.0)
MCH: 29.4 pg (ref 26.0–34.0)
MCHC: 34.1 g/dL (ref 30.0–36.0)
MCV: 86 fL (ref 80.0–100.0)
Monocytes Absolute: 0.8 10*3/uL (ref 0.1–1.0)
Monocytes Relative: 8 %
Neutro Abs: 5.3 10*3/uL (ref 1.7–7.7)
Neutrophils Relative %: 51 %
Platelets: 278 10*3/uL (ref 150–400)
RBC: 4.8 MIL/uL (ref 3.87–5.11)
RDW: 13.6 % (ref 11.5–15.5)
WBC: 10.3 10*3/uL (ref 4.0–10.5)
nRBC: 0 % (ref 0.0–0.2)

## 2020-06-25 LAB — COMPREHENSIVE METABOLIC PANEL
ALT: 14 U/L (ref 0–44)
AST: 19 U/L (ref 15–41)
Albumin: 4.2 g/dL (ref 3.5–5.0)
Alkaline Phosphatase: 69 U/L (ref 38–126)
Anion gap: 8 (ref 5–15)
BUN: 13 mg/dL (ref 6–20)
CO2: 23 mmol/L (ref 22–32)
Calcium: 9.5 mg/dL (ref 8.9–10.3)
Chloride: 107 mmol/L (ref 98–111)
Creatinine, Ser: 0.92 mg/dL (ref 0.44–1.00)
GFR, Estimated: >60 mL/min (ref >60)
Glucose, Bld: 90 mg/dL (ref 70–99)
Potassium: 4.4 mmol/L (ref 3.5–5.1)
Sodium: 138 mmol/L (ref 135–145)
Total Bilirubin: 0.2 mg/dL — ABNORMAL LOW (ref 0.3–1.2)
Total Protein: 7.9 g/dL (ref 6.5–8.1)

## 2020-06-25 LAB — CULTURE, BLOOD (ROUTINE X 2)
Culture: NO GROWTH
Special Requests: ADEQUATE

## 2020-06-25 LAB — URINE CULTURE

## 2020-06-25 LAB — LACTIC ACID, PLASMA: Lactic Acid, Venous: 1.1 mmol/L (ref 0.5–1.9)

## 2020-06-25 NOTE — ED Notes (Signed)
Patient discharged with no concerns AVS reviewed with patient.

## 2020-06-25 NOTE — ED Provider Notes (Signed)
[facility]-EMERGENCY DEPT Provider Note   CSN: [id number] Arrival date & time: 06/25/20  1430     History Chief Complaint  Patient presents with   Loss of Consciousness    Tanya Krause is a 35 y.o. female.  HPI Patient reports that she is continuing to have symptoms of sore throat, headache and fatigue.  Symptoms have been present now for over a week.  Some diffuse generalized abdominal pain.  No shortness of breath or difficulty breathing.    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 04/08/2019   Trochanteric bursitis of left hip 03/11/2019   S/P hysterectomy 02/06/2019   S/P TAH-BSO (total abdominal hysterectomy and bilateral salpingo-oophorectomy) 02/06/2019   Myofascial pain dysfunction syndrome 09/27/2018   Chronic bilateral back pain 09/03/2018   Colitis 08/12/2015   Oral thrush 11/18/2014   B12 deficiency 11/17/2014   Chronic pain syndrome 11/17/2014   Numbness 11/17/2014   Intractable headache 11/17/2014   Intractable chronic migraine without aura and without status migrainosus 11/17/2014   Migraines 10/11/2014   Primary fibromyalgia syndrome 10/11/2014   Lower extremity weakness 10/11/2014   Vitamin B12 deficiency 10/11/2014   Anxiety state 10/11/2014   Nausea & vomiting 10/08/2014   Headache, tension type, chronic 09/07/2012   Abdominal pain 06/15/2012    Past Surgical History:  Procedure Laterality Date   HYSTERECTOMY  ABDOMINAL WITH SALPINGO-OOPHORECTOMY Bilateral 02/06/2019   Procedure: HYSTERECTOMY ABDOMINAL WITH BILATERAL SALPINGO-OOPHORECTOMY;  Surgeon: Lazaro Arms, MD;  Location: [facility];  Service: Gynecology;  Laterality: Bilateral;   kidney infections     TUBAL LIGATION     WISDOM TOOTH EXTRACTION       OB History     Gravida  4   Para  4   Term  1   Preterm  3   AB      Living  4      SAB      IAB      Ectopic      Multiple      Live Births  4           Family History  Problem Relation Age of Onset   Hypertension Mother    Diabetes Mother    Heart failure Father    Colon cancer Paternal Grandfather     Social History   Tobacco Use   Smoking status: Some Days    Packs/day: 0.14    Years: 5.00    Pack years: 0.70    Types: Cigarettes   Smokeless tobacco: Never   Tobacco comments:    3 cig daily  Vaping Use   Vaping Use: Former  Substance Use Topics   Alcohol use: Not Currently   Drug use: Not Currently    Types: Marijuana    Comment: last used september 2020    Home Medications Prior to Admission medications   Medication Sig Start Date End Date Taking? Authorizing  Provider  acetaminophen (TYLENOL) 500 MG tablet Take 1,000 mg by mouth every 6 (six) hours as needed.    [provider]  albuterol (ACCUNEB) 1.25 MG/3ML nebulizer solution Take 1 ampule by nebulization every 6 (six) hours as needed for wheezing or shortness of breath.  12/03/19   [provider]  albuterol (PROVENTIL) (2.5 MG/3ML) 0.083% nebulizer solution Take 3 mLs (2.5 mg total) by nebulization every 6 (six) hours as needed for wheezing or shortness of breath. 06/20/20   Lorre Nick, MD  busPIRone (BUSPAR) 7.5 MG tablet Take 7.5 mg by mouth 3 (three) times daily. 03/23/20   [provider]  cyclobenzaprine (FLEXERIL) 10 MG tablet TAKE ONE TABLET BY MOUTH THREE TIMES DAILY AS NEEDED FOR MUSCLE SPASMS. Patient taking differently: Take 10 mg by mouth 3 (three)  times daily as needed for muscle spasms. T 09/30/19   Lovorn, Aundra Millet, MD  diclofenac (VOLTAREN) 50 MG EC tablet Take 1 tablet (50 mg total) by mouth 2 (two) times daily as needed (for swelling of hands/tendinitis). 03/20/20   Lovorn, Aundra Millet, MD  diphenhydrAMINE (BENADRYL) 25 MG tablet Take 25 mg by mouth every 6 (six) hours as needed for itching or allergies.    [provider]  EMGALITY 120 MG/ML SOAJ Inject 1 each into the skin every 30 (thirty) days. No matter what- to decrease migraines- 04/24/20   Lovorn, Aundra Millet, MD  estradiol (ESTRACE) 2 MG tablet Take 1 tablet (2 mg total) by mouth daily. 02/07/19   Lazaro Arms, MD  FLOVENT HFA 220 MCG/ACT inhaler Inhale 2 puffs into the lungs 2 (two) times daily. 12/03/19   [provider]  folic acid (FOLVITE) 1 MG tablet Take 1 tablet (1 mg total) by mouth daily. 10/17/14   Jerald Kief, MD  HYDROcodone-acetaminophen (NORCO/VICODIN) 5-325 MG tablet Take 1 tablet by mouth 3 (three) times daily as needed. for pain 06/01/20   Lovorn, Aundra Millet, MD  hydrOXYzine (ATARAX/VISTARIL) 25 MG tablet Take 1 tablet (25 mg total) by mouth every 8 (eight) hours as needed. 06/19/19   Lovorn, Aundra Millet, MD  lidocaine (LIDODERM) 5 % Place 3 patches onto the skin daily. Remove & Discard patch within 12 hours or as directed by MD Patient taking differently: Place 1 patch onto the skin daily. 05/13/19   Lovorn, Aundra Millet, MD  nystatin (MYCOSTATIN) 100000 UNIT/ML suspension TAKE ONE TEASPOONFUL ( ) BY MOUTH FOUR TIMES DAILY 10/10/19   Lovorn, Aundra Millet, MD  pantoprazole (PROTONIX) 40 MG tablet Take 1 tablet (40 mg total) by mouth daily. For reflux/heartburn that's daily. 04/24/20   Lovorn, Aundra Millet, MD  Polyethylene Glycol 3350 (MIRALAX PO) Take 17 g by mouth daily as needed (constipation).     [provider]  promethazine (PHENERGAN) 25 MG tablet Take 25 mg by mouth every 8 (eight) hours as needed for nausea or vomiting.  11/27/19   [provider]  promethazine  (PHENERGAN) 6.25 MG/5ML syrup TAKE TWO TEASPOONSFUL ( ) BY MOUTH FOUR TIMES DAILY AS NEEDED FOR NAUSEA AND VOMITING. 02/18/20   Lovorn, Aundra Millet, MD  SUMAtriptan (IMITREX) 100 MG tablet Take 1 tablet (100 mg total) by mouth every 2 (two) hours as needed for migraine. May repeat in 2 hours if headache persists or recurs. 06/01/20   Lovorn, Aundra Millet, MD  valACYclovir (VALTREX) 1000 MG tablet Take 1 tablet (1,000 mg total) by mouth 3 (three) times daily. 03/31/19   Eustace Moore, MD  venlafaxine (EFFEXOR) 37.5 MG tablet Take 37.5 mg by mouth every morning. 02/20/20  [provider]  zolpidem (AMBIEN) 5 MG tablet TAKE ONE TABLET BY MOUTH AT BEDTIME AS NEEDED FOR SLEEP Patient not taking: No sig reported 03/06/20   Lovorn, Aundra Millet, MD    Allergies    Metoclopramide, Tomato, Aspirin, Doxycycline, Hydromorphone hcl, Ibuprofen, Ondansetron, Other, Shellfish allergy, Toradol [ketorolac tromethamine], Latex, and Tramadol  Review of Systems   Review of Systems 10 systems reviewed and negative except as per HPI Physical Exam Updated Vital Signs BP (!) 135/100   Pulse 92   Temp 98.7 F (37.1 C) (Oral)   Resp 18   Ht 5' (1.524 m)   Wt 80.7 kg   LMP 12/04/2018   SpO2 99%   BMI 34.76 kg/m   Physical Exam Constitutional:      Appearance: Normal appearance.  HENT:     Nose: Nose normal.     Mouth/Throat:     Mouth: Mucous membranes are moist.     Pharynx: Oropharynx is clear.     Comments: Mild erythema of tonsillar pillars.  No large swelling or exudates.  Voice is clear. Eyes:     Extraocular Movements: Extraocular movements intact.     Pupils: Pupils are equal, round, and reactive to light.  Cardiovascular:     Rate and Rhythm: Normal rate and regular rhythm.  Pulmonary:     Effort: Pulmonary effort is normal.     Breath sounds: Normal breath sounds.  Abdominal:     General: There is no distension.     Palpations: Abdomen is soft.     Comments: Mild diffuse tenderness without  guarding  Musculoskeletal:        General: No swelling or tenderness. Normal range of motion.     Cervical back: Neck supple.     Right lower leg: No edema.     Left lower leg: No edema.  Skin:    General: Skin is warm and dry.  Neurological:     General: No focal deficit present.     Mental Status: She is alert and oriented to person, place, and time.     Coordination: Coordination normal.  Psychiatric:        Mood and Affect: Mood normal.    ED Results / Procedures / Treatments   Labs (all labs ordered are listed, but only abnormal results are displayed) Labs Reviewed  COMPREHENSIVE METABOLIC PANEL - Abnormal; Notable for the following components:      Result Value   Total Bilirubin 0.2 (*)    All other components within normal limits  CBC WITH DIFFERENTIAL/PLATELET  LACTIC ACID, PLASMA  LACTIC ACID, PLASMA  URINALYSIS, ROUTINE W REFLEX MICROSCOPIC    EKG None  Radiology DG Chest 2 View  Result Date: 06/24/2020 CLINICAL DATA:  Fever, generalized weakness EXAM: CHEST - 2 VIEW COMPARISON:  06/20/2020 FINDINGS: The heart size and mediastinal contours are within normal limits. No focal airspace consolidation, pleural effusion, or pneumothorax. the visualized skeletal structures are unremarkable. IMPRESSION: No active cardiopulmonary disease. Electronically Signed   By: Duanne Guess D.O.   On: 06/24/2020 07:56    Procedures Procedures   Medications Ordered in ED Medications - No data to display  ED Course  I have reviewed the triage vital signs and the nursing notes.  Pertinent labs & imaging results that were available during my care of the patient were reviewed by me and considered in my medical decision making (see chart for details).    MDM Rules/Calculators/A&P  Patient reports persistent symptoms.  She has had 2 sets of blood cultures done within the past week.  First that had contaminant was repeated.  Regrowth is negative.  Labs  have been stable.  Clinically, patient is well in appearance.  Vital signs are stable with patient afebrile.  At this time very low suspicion for bacterial etiology.  Plan will be to continue symptomatic treatment at home with salt water gargle, Tylenol and rest.  Recommended follow-up with PCP within 2 to 4 days. Final Clinical Impression(s) / ED Diagnoses Final diagnoses:  Pharyngitis, unspecified etiology  Fatigue, unspecified type    Rx / DC Orders ED Discharge Orders     None        Arby Barrette, MD 06/25/20 2115

## 2020-06-25 NOTE — ED Provider Notes (Signed)
Emergency Medicine Provider Triage Evaluation Note  Tanya Krause , a 35 y.o. female  was evaluated in triage.  Pt complains of syncopal episode that occurred pta. She was also noted to have positive blood cultures.  Review of Systems  Positive: Syncope, fevers, cough, vomiting Negative: Urinary sxs  Physical Exam  BP (!) 130/98 (BP Location: Left Arm)   Pulse (!) 113   Temp 98.7 F (37.1 C) (Oral)   Resp 16   Ht 5' (1.524 m)   Wt 80.7 kg   LMP 12/04/2018   SpO2 96%   BMI 34.76 kg/m  Gen:   Awake, no distress   Resp:  Normal effort  MSK:   Moves extremities without difficulty  Other:  Tachycardic, lungs ctab   Medical Decision Making  Medically screening exam initiated at 3:21 PM.  Appropriate orders placed.  [place] M Haigler was informed that the remainder of the evaluation will be completed by another provider, this initial triage assessment does not replace that evaluation, and the importance of remaining in the ED until their evaluation is complete.     Rayne Du 06/25/20 1525    Arby Barrette, MD 06/30/20 2139

## 2020-06-25 NOTE — ED Triage Notes (Signed)
Patient states she was called and told that her blood cultures were positive and to come to the ED. Patient was seen on 06/23/20 for the same.  Patient states she passed out while trying to get back to her bed. Patient states she did not hit her head. Patient states her nurse at home saw her when she passed out.

## 2020-06-25 NOTE — Discharge Instructions (Addendum)
1.  At this time your symptoms are suspected to be due to a virus.  Your blood cultures have come back negative.  Currently no source of bacterial infection has been identified. 2.  Continue take over-the-counter Tylenol for body aches, sore throat or fever.  Stay well-hydrated.  You may do salt water gargles. 3.  Make an appointment to see your family doctor for recheck within the next 2 to 4 days.

## 2020-06-26 ENCOUNTER — Other Ambulatory Visit: Payer: Self-pay | Admitting: Physical Medicine and Rehabilitation

## 2020-06-28 LAB — CULTURE, BLOOD (ROUTINE X 2)
Culture: NO GROWTH
Culture: NO GROWTH

## 2020-07-01 ENCOUNTER — Other Ambulatory Visit: Payer: Self-pay | Admitting: Physical Medicine and Rehabilitation

## 2020-07-01 ENCOUNTER — Other Ambulatory Visit: Payer: Self-pay

## 2020-07-01 ENCOUNTER — Ambulatory Visit (HOSPITAL_COMMUNITY): Payer: Medicaid Other | Attending: Family Medicine | Admitting: Occupational Therapy

## 2020-07-01 ENCOUNTER — Encounter (HOSPITAL_COMMUNITY): Payer: Self-pay | Admitting: Occupational Therapy

## 2020-07-01 DIAGNOSIS — M79642 Pain in left hand: Secondary | ICD-10-CM | POA: Insufficient documentation

## 2020-07-01 DIAGNOSIS — R52 Pain, unspecified: Secondary | ICD-10-CM | POA: Insufficient documentation

## 2020-07-01 DIAGNOSIS — M6281 Muscle weakness (generalized): Secondary | ICD-10-CM | POA: Diagnosis present

## 2020-07-01 DIAGNOSIS — R29898 Other symptoms and signs involving the musculoskeletal system: Secondary | ICD-10-CM | POA: Diagnosis present

## 2020-07-01 DIAGNOSIS — M79641 Pain in right hand: Secondary | ICD-10-CM | POA: Insufficient documentation

## 2020-07-01 DIAGNOSIS — R601 Generalized edema: Secondary | ICD-10-CM | POA: Insufficient documentation

## 2020-07-01 NOTE — Therapy (Signed)
[facility] [address] [place], [place], [address] Phone: [phone]   Fax:  [phone]  Occupational Therapy Treatment  Patient Details  Name: Tanya Krause MRN: [id number] Date of Birth: 02/12/1985 Referring Provider (OT): Dr. Payton Emerald   Encounter Date: 07/01/2020   OT End of Session - 07/01/20 1608     Visit Number 3    Number of Visits 6    Date for OT Re-Evaluation 07/08/20    Authorization Type [employer] 27 visits allowed - will need to split with PT    Authorization - Visit Number 3    Authorization - Number of Visits 13    OT Start Time 1349    OT Stop Time 1414    OT Time Calculation (min) 25 min    Activity Tolerance Patient tolerated treatment well    Behavior During Therapy [facility] for tasks assessed/performed             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   Subjective Assessment - 07/01/20 1607     Subjective  S: It just feels like bees stinging me all the time.    Currently in Pain? Yes    Pain Score 6     Pain Location Hand    Pain Orientation Right;Left    Pain Descriptors / Indicators Burning   stinging   Pain Type Chronic pain    Pain Radiating Towards hands to shoulders    Pain Onset More than a month ago    Pain Frequency Constant    Aggravating Factors  use, pressure on  hands    Pain Relieving Factors none    Effect of Pain on Daily Activities max effect on ADLs    Multiple Pain Sites No                [facility] OT Assessment - 07/01/20 1350       Assessment   Medical Diagnosis chronic pain      Precautions   Precautions Fall                      OT Treatments/Exercises (OP) - 07/01/20 1603       Exercises   Exercises Hand      Hand Exercises   Opposition AROM;Both;10 reps    Other Hand Exercises finger taps, 5x each digit, BUE    Other Hand Exercises composite digit flexion/extension to make full fist, 5x each      Manual Therapy   Manual Therapy Passive ROM    Passive ROM passive stretching completed to BUE to work towards a full fist, pt only able to tolerate approximately 50% range  OT Short Term Goals - 06/03/20 1728       OT SHORT TERM GOAL #1   Title Patient will be educated and independent with HEP for improved functional use of her hands.    Time 6    Period Weeks    Status On-going    Target Date 07/08/20      OT SHORT TERM GOAL #2   Title Patient will improve functional grip from 75% to 90% in bilateral hands.    Time 6    Period Weeks    Status On-going      OT SHORT TERM GOAL #3   Title Patient will improve bilateral grip strength by 10# and pinch strength by 2# for improved ability to maintain grasp on walker and drink bottles.    Time 6    Period Weeks    Status On-going      OT SHORT TERM GOAL #4   Title Patient will improve fine motor coordination as evidence by decreasing completion time on nine hole peg test by 30" or more for improved ability to tie her shoes and button buttons.    Time 6    Period Weeks    Status On-going      OT SHORT TERM GOAL #5   Title Patient will decrease edema by .5 cm or more in bilateral hands for improved mobility and functional use.    Time 6    Period Weeks    Status On-going      OT SHORT TERM GOAL #6   Title patient  will decrease pain in her hands to 5/10 or better during functional tasks.    Time 6    Period Weeks    Status On-going      OT SHORT TERM GOAL #7   Title patient will decrease hypersensitivity in her hands by 50% per patient report for increased ability to complete daily tasks.    Time 6    Period Weeks    Status On-going                      Plan - 07/01/20 1609     Clinical Impression Statement A: Pt arrived for her 2:30 appt but had to leave at 2:15, therefore switched providers and OT saw pt for a shorter session. Pt reports she is not wearing medium sized edema gloves and requests trial of large gloves. OT fitted and educated on wearing for at least 10 minutes at a time and then increasing length of wear with goal of 1 hour. When pt is able to tolerate loose large sized gloves go back to medium sized gloves for compression. Pt trialed foam for utensils but reports she still can't hold her utensils well. She has not purchased any other AE. Pt with poor tolerance and ability to complete exercises today, reports bee stinging like pain in hands and going up to left shoulder. Appears tender to touch but is able to tolerate self-massage/pressure. Attempted gentle finger A/ROM and passive stretching, pt requiring significantly increased time for completion with very limited gains in any ROM. Discussed importance of medical management of pain and completing exercises to promote tissue mobilization and mobility. Pt verbalized understanding.    Body Structure / Function / Physical Skills ADL;Coordination;GMC;Muscle spasms;UE functional use;Sensation;Decreased knowledge of use of DME;IADL;Pain;Strength;FMC;Mobility;ROM;Tone;Edema    Plan P: Reassessment, discharge pt due to poor tolerance for therapy at this time    OT Home Exercise Plan eval:  edema glove  use    Consulted and Agree with Plan of Care Patient             Patient will benefit from skilled therapeutic intervention in  order to improve the following deficits and impairments:   Body Structure / Function / Physical Skills: ADL, Coordination, GMC, Muscle spasms, UE functional use, Sensation, Decreased knowledge of use of DME, IADL, Pain, Strength, FMC, Mobility, ROM, Tone, Edema       Visit Diagnosis: Pain in left hand  Pain in right hand  Other symptoms and signs involving the musculoskeletal system  Generalized edema    Problem List Patient Active Problem List   Diagnosis Date Noted   Insomnia due to medical condition 07/17/2019   Encounter for long-term use of opiate analgesic 04/08/2019   Encounter for medication monitoring 04/08/2019   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 04/08/2019   Trochanteric bursitis of left hip 03/11/2019   S/P hysterectomy 02/06/2019   S/P TAH-BSO (total abdominal hysterectomy and bilateral salpingo-oophorectomy) 02/06/2019   Myofascial pain dysfunction syndrome 09/27/2018   Chronic bilateral back pain 09/03/2018   Colitis 08/12/2015   Oral thrush 11/18/2014   B12 deficiency 11/17/2014   Chronic pain syndrome 11/17/2014   Numbness 11/17/2014   Intractable headache 11/17/2014   Intractable chronic migraine without aura and without status migrainosus 11/17/2014   Migraines 10/11/2014   Primary fibromyalgia syndrome 10/11/2014   Lower extremity weakness 10/11/2014   Vitamin B12 deficiency 10/11/2014   Anxiety state 10/11/2014   Nausea & vomiting 10/08/2014   Headache, tension type, chronic 09/07/2012   Abdominal pain 06/15/2012    Ezra Sites, OTR/L  [phone] 07/01/2020, 4:14 PM  [facility] [address] [place], [place], [address] Phone: [phone]   Fax:  [phone]  Name: Tanya Krause MRN: [id number] Date of Birth: 10-06-1985

## 2020-07-06 ENCOUNTER — Ambulatory Visit: Payer: Medicaid Other | Admitting: Rheumatology

## 2020-07-06 ENCOUNTER — Other Ambulatory Visit: Payer: Self-pay

## 2020-07-06 ENCOUNTER — Ambulatory Visit: Payer: Self-pay

## 2020-07-06 ENCOUNTER — Encounter: Payer: Self-pay | Admitting: Rheumatology

## 2020-07-06 ENCOUNTER — Telehealth: Payer: Self-pay

## 2020-07-06 VITALS — BP 119/86 | HR 99 | Resp 17 | Ht 59.75 in | Wt 174.4 lb

## 2020-07-06 DIAGNOSIS — E538 Deficiency of other specified B group vitamins: Secondary | ICD-10-CM

## 2020-07-06 DIAGNOSIS — M545 Low back pain, unspecified: Secondary | ICD-10-CM | POA: Diagnosis not present

## 2020-07-06 DIAGNOSIS — G894 Chronic pain syndrome: Secondary | ICD-10-CM

## 2020-07-06 DIAGNOSIS — M79641 Pain in right hand: Secondary | ICD-10-CM | POA: Diagnosis not present

## 2020-07-06 DIAGNOSIS — Z9079 Acquired absence of other genital organ(s): Secondary | ICD-10-CM

## 2020-07-06 DIAGNOSIS — G4709 Other insomnia: Secondary | ICD-10-CM

## 2020-07-06 DIAGNOSIS — G8929 Other chronic pain: Secondary | ICD-10-CM

## 2020-07-06 DIAGNOSIS — Z90722 Acquired absence of ovaries, bilateral: Secondary | ICD-10-CM

## 2020-07-06 DIAGNOSIS — M2550 Pain in unspecified joint: Secondary | ICD-10-CM

## 2020-07-06 DIAGNOSIS — F419 Anxiety disorder, unspecified: Secondary | ICD-10-CM

## 2020-07-06 DIAGNOSIS — Z862 Personal history of diseases of the blood and blood-forming organs and certain disorders involving the immune mechanism: Secondary | ICD-10-CM

## 2020-07-06 DIAGNOSIS — M069 Rheumatoid arthritis, unspecified: Secondary | ICD-10-CM

## 2020-07-06 DIAGNOSIS — M542 Cervicalgia: Secondary | ICD-10-CM

## 2020-07-06 DIAGNOSIS — M79642 Pain in left hand: Secondary | ICD-10-CM

## 2020-07-06 DIAGNOSIS — G43519 Persistent migraine aura without cerebral infarction, intractable, without status migrainosus: Secondary | ICD-10-CM

## 2020-07-06 DIAGNOSIS — M7062 Trochanteric bursitis, left hip: Secondary | ICD-10-CM

## 2020-07-06 DIAGNOSIS — Z90710 Acquired absence of both cervix and uterus: Secondary | ICD-10-CM

## 2020-07-06 DIAGNOSIS — M797 Fibromyalgia: Secondary | ICD-10-CM

## 2020-07-06 DIAGNOSIS — F32A Depression, unspecified: Secondary | ICD-10-CM

## 2020-07-06 NOTE — Telephone Encounter (Signed)
Per Dr. Corliss Skains, if lab results are negative, we can cancel follow up appointment and call with lab results. Thanks!

## 2020-07-06 NOTE — Patient Instructions (Signed)
Hand Exercises Hand exercises can be helpful for almost anyone. These exercises can strengthen the hands, improve flexibility and movement, and increase blood flow to the hands. These results can make work and daily tasks easier. Hand exercises can be especially helpful for people who have joint pain from arthritis or have nerve damage from overuse (carpal tunnel syndrome). These exercises can also help people who have injured a hand. Exercises Most of these hand exercises are gentle stretching and motion exercises. It is usually safe to do them often throughout the day. Warming up your hands before exercise may help to reduce stiffness. You can do this with gentle massage orby placing your hands in warm water for 10-15 minutes. It is normal to feel some stretching, pulling, tightness, or mild discomfort as you begin new exercises. This will gradually improve. Stop an exercise right away if you feel sudden, severe pain or your pain gets worse. Ask your healthcare provider which exercises are best for you. Knuckle bend or "claw" fist Stand or sit with your arm, hand, and all five fingers pointed straight up. Make sure to keep your wrist straight during the exercise. Gently bend your fingers down toward your palm until the tips of your fingers are touching the top of your palm. Keep your big knuckle straight and just bend the small knuckles in your fingers. Hold this position for __________ seconds. Straighten (extend) your fingers back to the starting position. Repeat this exercise 5-10 times with each hand. Full finger fist Stand or sit with your arm, hand, and all five fingers pointed straight up. Make sure to keep your wrist straight during the exercise. Gently bend your fingers into your palm until the tips of your fingers are touching the middle of your palm. Hold this position for __________ seconds. Extend your fingers back to the starting position, stretching every joint fully. Repeat this  exercise 5-10 times with each hand. Straight fist Stand or sit with your arm, hand, and all five fingers pointed straight up. Make sure to keep your wrist straight during the exercise. Gently bend your fingers at the big knuckle, where your fingers meet your hand, and the middle knuckle. Keep the knuckle at the tips of your fingers straight and try to touch the bottom of your palm. Hold this position for __________ seconds. Extend your fingers back to the starting position, stretching every joint fully. Repeat this exercise 5-10 times with each hand. Tabletop Stand or sit with your arm, hand, and all five fingers pointed straight up. Make sure to keep your wrist straight during the exercise. Gently bend your fingers at the big knuckle, where your fingers meet your hand, as far down as you can while keeping the small knuckles in your fingers straight. Think of forming a tabletop with your fingers. Hold this position for __________ seconds. Extend your fingers back to the starting position, stretching every joint fully. Repeat this exercise 5-10 times with each hand. Finger spread Place your hand flat on a table with your palm facing down. Make sure your wrist stays straight as you do this exercise. Spread your fingers and thumb apart from each other as far as you can until you feel a gentle stretch. Hold this position for __________ seconds. Bring your fingers and thumb tight together again. Hold this position for __________ seconds. Repeat this exercise 5-10 times with each hand. Making circles Stand or sit with your arm, hand, and all five fingers pointed straight up. Make sure to keep your   wrist straight during the exercise. Make a circle by touching the tip of your thumb to the tip of your index finger. Hold for __________ seconds. Then open your hand wide. Repeat this motion with your thumb and each finger on your hand. Repeat this exercise 5-10 times with each hand. Thumb motion Sit  with your forearm resting on a table and your wrist straight. Your thumb should be facing up toward the ceiling. Keep your fingers relaxed as you move your thumb. Lift your thumb up as high as you can toward the ceiling. Hold for __________ seconds. Bend your thumb across your palm as far as you can, reaching the tip of your thumb for the small finger (pinkie) side of your palm. Hold for __________ seconds. Repeat this exercise 5-10 times with each hand. Grip strengthening  Hold a stress ball or other soft ball in the middle of your hand. Slowly increase the pressure, squeezing the ball as much as you can without causing pain. Think of bringing the tips of your fingers into the middle of your palm. All of your finger joints should bend when doing this exercise. Hold your squeeze for __________ seconds, then relax. Repeat this exercise 5-10 times with each hand. Contact a health care provider if: Your hand pain or discomfort gets much worse when you do an exercise. Your hand pain or discomfort does not improve within 2 hours after you exercise. If you have any of these problems, stop doing these exercises right away. Do not do them again unless your health care provider says that you can. Get help right away if: You develop sudden, severe hand pain or swelling. If this happens, stop doing these exercises right away. Do not do them again unless your health care provider says that you can. This information is not intended to replace advice given to you by your health care provider. Make sure you discuss any questions you have with your healthcare provider. Document Revised: 04/26/2018 Document Reviewed: 01/04/2018 Elsevier Patient Education  2022 [employer]  

## 2020-07-07 LAB — RHEUMATOID FACTOR: Rheumatoid fact SerPl-aCnc: <14 IU/mL (ref <14)

## 2020-07-07 LAB — SEDIMENTATION RATE: Sed Rate: 29 mm/h — ABNORMAL HIGH (ref 0–20)

## 2020-07-07 LAB — CYCLIC CITRUL PEPTIDE ANTIBODY, IGG: Cyclic Citrullin Peptide Ab: <16 UNITS

## 2020-07-07 NOTE — Progress Notes (Signed)
Office Visit Note  Patient: Tanya Krause             Date of Birth: Mar 16, 1985           MRN: [id number]             PCP: Laruth Bouchard, MD (Inactive) Referring: No ref. provider found Visit Date: 07/21/2020 Occupation: @  Subjective:  Pain in multiple joints   History of Present Illness: [place] LISVET Tanya Krause is a 35 y.o. female with a history of degenerative disc disease and fibromyalgia syndrome.  She states she continues to have a lot of pain and discomfort in her bilateral hands.  She describes swelling in her hands.  She states she has constant muscle spasms in her neck and back.  She has generalized pain and discomfort from fibromyalgia.  She continues to have discomfort in the bilateral trochanteric bursa.  Activities of Daily Living:  Patient reports morning stiffness for 24 hours.   Patient Reports nocturnal pain.  Difficulty dressing/grooming: Reports Difficulty climbing stairs: Reports Difficulty getting out of chair: Reports Difficulty using hands for taps, buttons, cutlery, and/or writing: Reports  Review of Systems  Constitutional:  Positive for fatigue. Negative for night sweats, weight gain and weight loss.  HENT:  Positive for mouth dryness. Negative for mouth sores, trouble swallowing, trouble swallowing and nose dryness.   Eyes:  Negative for pain, redness, visual disturbance and dryness.  Respiratory:  Negative for cough, shortness of breath and difficulty breathing.   Cardiovascular:  Negative for chest pain, palpitations, hypertension, irregular heartbeat and swelling in legs/feet.  Gastrointestinal:  Negative for blood in stool, constipation and diarrhea.  Endocrine: Negative for increased urination.  Genitourinary:  Negative for vaginal dryness.  Musculoskeletal:  Positive for joint pain, joint pain, joint swelling, myalgias, morning stiffness, muscle tenderness and myalgias. Negative for muscle weakness.  Skin:  Negative for color change, rash, hair  loss, skin tightness, ulcers and sensitivity to sunlight.  Allergic/Immunologic: Negative for susceptible to infections.  Neurological:  Positive for headaches. Negative for dizziness, memory loss, night sweats and weakness.  Hematological:  Negative for swollen glands.  Psychiatric/Behavioral:  Positive for depressed mood and sleep disturbance. The patient is nervous/anxious.    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 04/08/2019   Trochanteric bursitis of left hip 03/11/2019   S/P hysterectomy 02/06/2019   S/P TAH-BSO (total abdominal hysterectomy and bilateral salpingo-oophorectomy) 02/06/2019   Myofascial pain dysfunction syndrome 09/27/2018   Chronic bilateral back pain 09/03/2018   Colitis 08/12/2015   Oral thrush 11/18/2014   B12 deficiency 11/17/2014   Chronic pain syndrome 11/17/2014   Numbness 11/17/2014   Intractable headache 11/17/2014   Intractable chronic migraine without aura and without status migrainosus 11/17/2014   Migraines 10/11/2014   Primary fibromyalgia syndrome 10/11/2014   Lower extremity weakness 10/11/2014   Vitamin B12 deficiency 10/11/2014   Anxiety state 10/11/2014   Nausea & vomiting 10/08/2014   Headache, tension type, chronic 09/07/2012   Abdominal pain 06/15/2012    Past Medical History:  Diagnosis Date   Asthma    Chronic abdominal pain    Chronic migraine    Chronic  pain syndrome    Fibromyalgia    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   Hypertension    Migraine    Pelvic inflammatory disease (PID)     Family History  Problem Relation Age of Onset   Hypertension  Mother    Diabetes Mother    Heart failure Father    Colon cancer Paternal Grandfather    Healthy Daughter    Healthy Son    Healthy Son    Healthy Son    Past Surgical History:  Procedure Laterality Date   HYSTERECTOMY ABDOMINAL WITH SALPINGO-OOPHORECTOMY Bilateral 02/06/2019   Procedure: HYSTERECTOMY ABDOMINAL WITH BILATERAL SALPINGO-OOPHORECTOMY;  Surgeon: Lazaro Arms, MD;  Location: [facility];  Service: Gynecology;  Laterality: Bilateral;   kidney infections     TUBAL LIGATION     WISDOM TOOTH EXTRACTION     Social History   Social History Narrative   Lives with parents, children   Caffeine use- coffee once a day, sodas x 2 a day   Immunization History  Administered Date(s) Administered   Influenza,inj,Quad PF,6+ Mos 10/10/2014   Pneumococcal Polysaccharide-23 10/10/2014     Objective: Vital Signs: BP 130/84 (BP Location: Right Arm, Patient Position: Sitting, Cuff Size: Small)   Pulse (!) 123   Resp 12   Ht 5' (1.524 m)   Wt 183 lb 3.2 oz (83.1 kg)   LMP 12/04/2018   BMI 35.78 kg/m    Physical Exam Vitals and nursing note reviewed.  Constitutional:      Appearance: She is well-developed.  HENT:     Head: Normocephalic and atraumatic.  Eyes:     Conjunctiva/sclera: Conjunctivae normal.  Cardiovascular:     Rate and Rhythm: Normal rate and regular rhythm.     Heart sounds: Normal heart sounds.  Pulmonary:     Effort: Pulmonary effort is normal.     Breath sounds: Normal breath sounds.  Abdominal:     General: Bowel sounds are normal.     Palpations: Abdomen is soft.  Musculoskeletal:     Cervical back: Normal range of motion.  Lymphadenopathy:     Cervical: No cervical adenopathy.  Skin:    General: Skin is warm and dry.     Capillary Refill: Capillary refill takes less than 2 seconds.  Neurological:     Mental Status: She is alert and oriented to person, place, and time.  Psychiatric:        Behavior: Behavior normal.     Musculoskeletal Exam:  She mobilizes with the help of a walker.  She has limited range of motion of her cervical spine with discomfort.  She also had discomfort in thoracic and lumbar region.  She could not raise her arms more than 90 degrees due to discomfort.  Elbow joints, wrist joints, MCPs, PIPs and DIPs were in good range of motion with no synovitis.  Although she describes tenderness in almost all of her joints.  Hips were difficult to assess full range of motion.  Knee joints with good range of motion.  There was no tenderness over ankles or MTPs.  CDAI Exam: CDAI Score: -- Patient Global: --; Provider Global: -- Swollen: --; Tender: -- Joint Exam 07/21/2020   No joint exam has been documented for this visit   There is currently no information documented on the homunculus. Go to the Rheumatology activity and complete the homunculus joint exam.  Investigation: No additional findings.  Imaging: DG Chest 2 View  Result Date: 06/24/2020 CLINICAL DATA:  Fever, generalized weakness EXAM: CHEST - 2 VIEW COMPARISON:  06/20/2020 FINDINGS: The heart size and mediastinal contours are within normal limits. No focal airspace consolidation, pleural effusion, or pneumothorax. the visualized skeletal structures are unremarkable. IMPRESSION: No active cardiopulmonary disease. Electronically Signed   By: Duanne Guess D.O.   On: 06/24/2020 07:56   [place] COMPLETE JOINT SPACE STRUCTURES UP BILAT  Result Date: 07/21/2020 Ultrasound examination of the bilateral hands was performed per EULAR recommendations. Using a transducer, grayscale and power Doppler bilateral the second, third, and fifth MCP joints and bilateral wrist joints both dorsal and volar aspects were evaluated to look for synovitis or tenosynovitis. The findings were there was no synovitis or tenosynovitis on ultrasound examination.  The right median nerve was 0.04 cm squares which was within normal limits and the left median nerve was 0.06 cm squares which was  within normal limits. Impression: Ultrasound examination of bilateral hands did not show any synovitis or tenosynovitis.  Bilateral adnexa within normal limits.  XR Hand 2 View Left  Result Date: 07/06/2020 No MCP, PIP, DIP, CMC, intercarpal or radiocarpal joint space narrowing was noted.  No erosive changes were noted. Impression: Unremarkable x-ray of the hand.  XR Hand 2 View Right  Result Date: 07/06/2020 No MCP, PIP, DIP, CMC, intercarpal or radiocarpal joint space narrowing was noted.  No erosive changes were noted. Impression: Unremarkable x-ray of the hand.   Recent Labs: Lab Results  Component Value Date   WBC 10.3 06/25/2020   HGB 14.1 06/25/2020   PLT 278 06/25/2020   NA 138 06/25/2020   K 4.4 06/25/2020   CL 107 06/25/2020   CO2 23 06/25/2020   GLUCOSE 90 06/25/2020   BUN 13 06/25/2020   CREATININE 0.92 06/25/2020   BILITOT 0.2 (L) 06/25/2020   ALKPHOS 69 06/25/2020   AST 19 06/25/2020   ALT 14 06/25/2020   PROT 7.9 06/25/2020   ALBUMIN 4.2 06/25/2020   CALCIUM 9.5 06/25/2020   GFRAA >60 02/07/2019   07/06/2020 ESR29, RF-, CCP Ab-  Speciality Comments: No specialty comments available.  Procedures:  No procedures performed Allergies: Metoclopramide, Tomato, Aspirin, Doxycycline, Hydromorphone hcl, Ibuprofen, Ondansetron, Other, Shellfish allergy, Toradol [ketorolac tromethamine], Latex, and Tramadol   Assessment / Plan:     Visit Diagnoses: Pain in both hands - history of pain and swelling in bilateral hands.  No synovitis was noted.  X-rays were unremarkable.  RF negative, anti-CCP negative, sed rate mildly elevated.  She had no synovitis on examination.  Although she had a lot of discomfort on palpation.- Plan: [place] COMPLETE JOINT SPACE STRUCTURES UP BILAT.  Ultrasound examination of bilateral hands.  Performed today which did not show any synovitis or tenosynovitis.  Bilateral median nerves within normal limits.  Left findings, x-ray findings and ultrasound  findings were discussed with the patient.  I offered referral to physical therapy and Occupational Therapy.  She stated that she has tried physical therapy and could not tolerate.  A handout on hand exercises was given.  DDD (degenerative disc disease), cervical - MRI showed a spinal stenosis at C5-C6 and C6-C7.  She gives history of chronic discomfort in her cervical region.  She has a lot of discomfort.  She has tried water therapy in the past which was helpful.  Chronic midline low back pain without sciatica - She had MRI of the thoracic and lumbar spine in the past which were unremarkable.  Tree of chronic pain and discomfort.  Patient was evaluated by neurosurgery .  Chronic pain syndrome - She is followed by pain management.  Fibromyalgia - history of generalized hyperalgesia.  She has positive tender joint.  She has difficulty with mobility.  She states that she has a caregiver 7 days a week   Trochanteric bursitis of both hips - Tenderness was noted over bilateral trochanteric bursa.  She is encouraged to do stretching exercises for IT band.  Other insomnia-contributes to fibromyalgia.  Good sleep hygiene was discussed.  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    Follow-Up Instructions: Return if symptoms worsen or fail to improve, for DDD.  There is nothing further to offer.   Pollyann Savoy, MD  Note - This record has been created using Animal nutritionist.  Chart creation errors have been sought, but may not always  have been located. Such creation errors do not reflect on  the  standard of medical care.

## 2020-07-08 ENCOUNTER — Other Ambulatory Visit: Payer: Self-pay

## 2020-07-08 ENCOUNTER — Encounter (HOSPITAL_COMMUNITY): Payer: Self-pay | Admitting: Occupational Therapy

## 2020-07-08 ENCOUNTER — Ambulatory Visit (HOSPITAL_COMMUNITY): Payer: Medicaid Other | Admitting: Occupational Therapy

## 2020-07-08 DIAGNOSIS — R29898 Other symptoms and signs involving the musculoskeletal system: Secondary | ICD-10-CM

## 2020-07-08 DIAGNOSIS — R601 Generalized edema: Secondary | ICD-10-CM

## 2020-07-08 DIAGNOSIS — M79642 Pain in left hand: Secondary | ICD-10-CM

## 2020-07-08 DIAGNOSIS — M6281 Muscle weakness (generalized): Secondary | ICD-10-CM

## 2020-07-08 DIAGNOSIS — R52 Pain, unspecified: Secondary | ICD-10-CM

## 2020-07-08 DIAGNOSIS — M79641 Pain in right hand: Secondary | ICD-10-CM

## 2020-07-08 NOTE — Therapy (Signed)
[place], [place], [address] Phone: [phone]   Fax:  [phone]  Occupational Therapy Treatment and Discharge  Patient Details  Name: Tanya Krause MRN: [id number] Date of Birth: Mar 19, 1985 Referring Provider (OT): Dr. Vanita Panda   Encounter Date: 07/08/2020   OT End of Session - 07/08/20 1612     Visit Number 4    Number of Visits 6    Date for OT Re-Evaluation 07/08/20    Authorization Type [employer] 27 visits allowed - will need to split with PT    Authorization - Visit Number 4    Authorization - Number of Visits 13    OT Start Time 1520    OT Stop Time 1600    OT Time Calculation (min) 40 min    Activity Tolerance Patient tolerated treatment well    Behavior During Therapy [facility] for tasks assessed/performed             Past Medical History:  Diagnosis Date   Asthma    Chronic abdominal pain    Chronic migraine    Chronic pain syndrome    Fibromyalgia    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   Subjective Assessment - 07/08/20 1520     Subjective  S: Hurts so bad.    Repetition --   stayed the same   Currently in Pain? Yes    Pain Score 9    Pt reported hurting all over.   Pain Location Hand    Pain Orientation Right;Left    Pain Descriptors / Indicators Dull;Sharp;Other (Comment)   pounding, consistent   Pain Type Chronic pain    Pain Radiating Towards to shoulders and neck    Pain Onset More than a month  ago    Pain Frequency Constant    Aggravating Factors  Any use.    Pain Relieving Factors none    Effect of Pain on Daily Activities max effect on ADL's    Multiple Pain Sites No                [facility] OT Assessment - 07/08/20 0001       Assessment   Medical Diagnosis chronic pain      Precautions   Precautions Fall      Coordination   9 Hole Peg Test Right;Left    Right 9 Hole Peg Test 4'17"   Slow labored movment; using 1st and 2nd digit to remove; 2"42" previously   Left 9 Hole Peg Test 5"01"   2"30" previously     Edema   Edema Wrist:  right:  15.9cm left:  14.9 cm, Distal palmar crease:  right:  19.7 cm left:  18.5 cm, MCPJ:  right:  18.0 cm left:  17.5 cm   previous Wrist:  right:  18.5cm left:  17.6 cm, Distal palmar crease:  right:  20 cm left:  19.5 cm, MCPJ:  right:  18.5 cm left:  17.6  cm     AROM   AROM Assessment Site Finger    Right/Left Finger Left    Right Composite Finger Extension --   100%   Right Composite Finger Flexion 50%   25 to 50%   Left Composite Finger Extension 75%   75 to 100%   Left Composite Finger Flexion 25%   25 to 50     Hand Function   Right Hand Gross Grasp Impaired    Right Hand Grip (lbs) 0.5   [place] registered. 18 previously   Right Hand Lateral Pinch 3 lbs   2 lb previously   Right Hand 3 Point Pinch 4 lbs   2 lb previously   Left Hand Gross Grasp Impaired    Left Hand Grip (lbs) 4   5 lb previously   Left Hand Lateral Pinch 2 lbs   2 lb previously   Left 3 point pinch 5 lbs   2 lb previously                     OT Treatments/Exercises (OP) - 07/08/20 0001       Exercises   Exercises Hand      Hand Exercises   Other Hand Exercises P/ROM copiste digit flexion and extension to make fist x10 bilaterally.                    OT Education - 07/08/20 1611     Education Details Educated on reason for dicharge being lack of progress and even regression.    Person(s) Educated Patient    Methods  Explanation    Comprehension Verbalized understanding              OT Short Term Goals - 07/08/20 1617       OT SHORT TERM GOAL #1   Title Patient will be educated and independent with HEP for improved functional use of her hands.    Time 6    Period Weeks    Status Not Met    Target Date 07/08/20      OT SHORT TERM GOAL #2   Title Patient will improve functional grip from 75% to 90% in bilateral hands.    Time 6    Period Weeks    Status Not Met      OT SHORT TERM GOAL #3   Title Patient will improve bilateral grip strength by 10# and pinch strength by 2# for improved ability to maintain grasp on walker and drink bottles.    Time 6    Period Weeks    Status Not Met      OT SHORT TERM GOAL #4   Title Patient will improve fine motor coordination as evidence by decreasing completion time on nine hole peg test by 30" or more for improved ability to tie her shoes and button buttons.    Time 6    Period Weeks    Status Not Met      OT SHORT TERM GOAL #5   Title Patient will decrease edema by .5 cm or more in bilateral hands for improved mobility and functional use.    Baseline 07/08/20: pt did decrease edema by .5 in all areas measured except R distal palmer crease and L MCPJ.    Time 6    Period Weeks    Status Partially Met      OT SHORT TERM GOAL #6   Title patient will decrease pain in her hands  to 5/10 or better during functional tasks.    Time 6    Period Weeks    Status Not Met      OT SHORT TERM GOAL #7   Title patient will decrease hypersensitivity in her hands by 50% per patient report for increased ability to complete daily tasks.    Time 6    Period Weeks    Status Not Met                      Plan - 07/08/20 1612     Clinical Impression Statement A: Pt reassessed this date and demonstrates mild improvements in edema measurements but continues to report 9/10 pain this date in B hands. Pt regressed significantly in R UE grip strength with  0.5 lb measurement compared to 18lbs at initial evaluation. Pt demonstrates decreased composit flexion during testing. Pt increased B coordination times dramatically via 9 hole peg test results of over 4 minutes R and over 5 minutes L UE. Improvements noted in B edema measurements but not fully meeting goal of decreasing all measured areas by .5 Despite poor function in assessment pt was noted to unlock brakes to leave session.    Body Structure / Function / Physical Skills ADL;Coordination;GMC;Muscle spasms;UE functional use;Sensation;Decreased knowledge of use of DME;IADL;Pain;Strength;FMC;Mobility;ROM;Tone;Edema    OT Treatment/Interventions Self-care/ADL training;Cryotherapy;Paraffin;Therapeutic exercise;DME and/or AE instruction;Manual Therapy;Neuromuscular education;Moist Heat;Energy conservation;Passive range of motion;Therapeutic activities;Patient/family education;Coping strategies training    Plan P: Pt discharged due to regression in several areas.    OT Home Exercise Plan eval:  edema glove use    Recommended Other Services resume PT            OCCUPATIONAL THERAPY DISCHARGE SUMMARY  Visits from Start of Care: 4  Current functional level related to goals / functional outcomes: Pt's edema has decreased but all other goal areas are the same or have become worse.    Remaining deficits: Pain, strength, ROM, edema, functional use.     Education / Equipment: Given Ship broker with education. Educated on adaptive tools for ADL's.  Plan: Patient agrees to discharge.  Patient goals were not met. Patient is being discharged due lack or progress and even regression in goal areas.      Patient will benefit from skilled therapeutic intervention in order to improve the following deficits and impairments:   Body Structure / Function / Physical Skills: ADL, Coordination, GMC, Muscle spasms, UE functional use, Sensation, Decreased knowledge of use of DME, IADL, Pain, Strength, FMC,  Mobility, ROM, Tone, Edema       Visit Diagnosis: Pain in left hand  Pain in right hand  Other symptoms and signs involving the musculoskeletal system  Generalized edema  Muscle weakness (generalized)  Generalized pain    Problem List Patient Active Problem List   Diagnosis Date Noted   Insomnia due to medical condition 07/17/2019   Encounter for long-term use of opiate analgesic 04/08/2019   Encounter for medication monitoring 04/08/2019   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 04/08/2019   Trochanteric bursitis of left hip 03/11/2019   S/P hysterectomy 02/06/2019   S/P TAH-BSO (total abdominal hysterectomy and bilateral salpingo-oophorectomy) 02/06/2019   Myofascial pain dysfunction syndrome 09/27/2018   Chronic bilateral back pain 09/03/2018   Colitis 08/12/2015   Oral thrush 11/18/2014   B12 deficiency 11/17/2014   Chronic pain syndrome 11/17/2014   Numbness 11/17/2014   Intractable headache 11/17/2014   Intractable chronic migraine without  aura and without status migrainosus 11/17/2014   Migraines 10/11/2014   Primary fibromyalgia syndrome 10/11/2014   Lower extremity weakness 10/11/2014   Vitamin B12 deficiency 10/11/2014   Anxiety state 10/11/2014   Nausea & vomiting 10/08/2014   Headache, tension type, chronic 09/07/2012   Abdominal pain 06/15/2012   [facility] OT, MOT  Larey Seat 07/08/2020, 4:27 PM  [place], [place], [address] Phone: [phone]   Fax:  [phone]  Name: Tanya Krause MRN: [id number] Date of Birth: 02-26-1985

## 2020-07-15 ENCOUNTER — Encounter (HOSPITAL_COMMUNITY): Payer: Medicaid Other | Admitting: Occupational Therapy

## 2020-07-17 ENCOUNTER — Other Ambulatory Visit: Payer: Self-pay

## 2020-07-17 ENCOUNTER — Encounter: Payer: Self-pay | Admitting: Physical Medicine and Rehabilitation

## 2020-07-17 ENCOUNTER — Encounter
Payer: Medicaid Other | Attending: Physical Medicine and Rehabilitation | Admitting: Physical Medicine and Rehabilitation

## 2020-07-17 VITALS — BP 115/79 | HR 116 | Temp 98.9°F | Ht 60.0 in | Wt 173.0 lb

## 2020-07-17 DIAGNOSIS — M549 Dorsalgia, unspecified: Secondary | ICD-10-CM | POA: Insufficient documentation

## 2020-07-17 DIAGNOSIS — M7918 Myalgia, other site: Secondary | ICD-10-CM | POA: Diagnosis present

## 2020-07-17 DIAGNOSIS — G8929 Other chronic pain: Secondary | ICD-10-CM | POA: Diagnosis present

## 2020-07-17 DIAGNOSIS — S99291A Other physeal fracture of phalanx of right toe, initial encounter for closed fracture: Secondary | ICD-10-CM | POA: Insufficient documentation

## 2020-07-17 DIAGNOSIS — G894 Chronic pain syndrome: Secondary | ICD-10-CM | POA: Diagnosis present

## 2020-07-17 NOTE — Patient Instructions (Addendum)
  Patient is is 35 yr old female with hx of chronic pain syndrome- with myofascial pain syndrome, fibromyalgia - Also has sickle cell disease. And chronic migraines- intractable.  Can have CBD/oil as long as doesn't have THC in it- no problem with taking CBD oil- it's legal.  Needs to have disability lawyer send me forms to fill out for pt. RFC? We discussed this at length what needs t occur to get me to fill out forms  Of note, when I do hand injections, I only use lidocaine and do trigger point injections, not steroid injections.  Patient here for trigger point injections for  Consent done and on chart.  Cleaned areas with alcohol and injected using a 27 gauge 1.5 inch needle  Injected 6cc Using 1% Lidocaine with no EPI  Upper traps B/L  Levators B/L Posterior scalenes B/L  Middle scalenes Splenius Capitus B/L Pectoralis Major B/L Rhomboids B/L Infraspinatus Teres Major/minor Thoracic paraspinals Lumbar paraspinals B/L x 2 Other injections-   Patient's level of pain prior was- 8.5-9/10 Current level of pain after injections is 8.5-9/10- but "feels a little better".  There was no bleeding or complications.  Patient was advised to drink a lot of water on day after injections to flush system Will have increased soreness for 12-48 hours after injections.  Can use Lidocaine patches the day AFTER injections Can use theracane on day of injections in places didn't inject Can use heating pad 4-6 hours AFTER injections F/U- 2 months for f/u and trigger point injections R great toe fracture- ice for first 72 hours then can use heat- use NSAIDs/anti- inflammatories if possible. Has taken ibuprofen 200-400 mg and take benadryl- has worked in past.

## 2020-07-17 NOTE — Progress Notes (Signed)
Patient is is 35 yr old female with hx of chronic pain syndrome- with myofascial pain syndrome, fibromyalgia - Also has sickle cell disease. And chronic migraines- intractable.     Pt is here for f/u on FMS/myofascial pain and trigger point injections.    Fell "up" stairs- at [employer] yesterday,  Feels like it's fractured- put ice on it.   Sees the disability doctor 7/6- Has a Chief Executive Officer.  Was told to needs to get RFC- residual functional exam.   Has been calling disability determination people since March 2022- still haven't gotten someone to call her back.    Got some aqua therapy but done- and told that doesn't need more OT for hands- discharged 6/22.   Saw Rheumatology- -had ESR of 29 (up slightly)- and Rh factor- was (-). As well as cyclic Citrul peptide Ab (-) Told no shots in hands today.   Exam: R great toe appears fractured- swollen, purple esp at middle joint- also turned slightly inwards  Plan:  Patient is is 35 yr old female with hx of chronic pain syndrome- with myofascial pain syndrome, fibromyalgia - Also has sickle cell disease. And chronic migraines- intractable.  Can have CBD/oil as long as doesn't have THC in it- no problem with taking CBD oil- it's legal.  Needs to have disability lawyer send me forms to fill out for pt. RFC? We discussed this at length what needs t occur to get me to fill out forms  Of note, when I do hand injections, I only use lidocaine and do trigger point injections, not steroid injections.  Patient here for trigger point injections for  Consent done and on chart.  Cleaned areas with alcohol and injected using a 27 gauge 1.5 inch needle  Injected 6cc Using 1% Lidocaine with no EPI  Upper traps B/L  Levators B/L Posterior scalenes B/L  Middle scalenes Splenius Capitus B/L Pectoralis Major B/L Rhomboids B/L Infraspinatus Teres Major/minor Thoracic paraspinals Lumbar paraspinals B/L x 2 Other injections-   Patient's level of  pain prior was- 8.5-9/10 Current level of pain after injections is 8.5-9/10- but "feels a little better".  There was no bleeding or complications.  Patient was advised to drink a lot of water on day after injections to flush system Will have increased soreness for 12-48 hours after injections.  Can use Lidocaine patches the day AFTER injections Can use theracane on day of injections in places didn't inject Can use heating pad 4-6 hours AFTER injections F/U- 2 months for f/u and trigger point injections R great toe fracture- ice for first 72 hours then can use heat- use NSAIDs/anti- inflammatories if possible. Has taken ibuprofen 200-400 mg and take benadryl- has worked in past. 31. Wear Crocs- not flip flops

## 2020-07-21 ENCOUNTER — Ambulatory Visit (INDEPENDENT_AMBULATORY_CARE_PROVIDER_SITE_OTHER): Payer: Medicaid Other | Admitting: Rheumatology

## 2020-07-21 ENCOUNTER — Ambulatory Visit: Payer: Medicaid Other

## 2020-07-21 ENCOUNTER — Encounter: Payer: Self-pay | Admitting: Rheumatology

## 2020-07-21 ENCOUNTER — Other Ambulatory Visit: Payer: Self-pay

## 2020-07-21 VITALS — BP 130/84 | HR 123 | Resp 12 | Ht 60.0 in | Wt 183.2 lb

## 2020-07-21 DIAGNOSIS — M7061 Trochanteric bursitis, right hip: Secondary | ICD-10-CM

## 2020-07-21 DIAGNOSIS — M797 Fibromyalgia: Secondary | ICD-10-CM

## 2020-07-21 DIAGNOSIS — M79642 Pain in left hand: Secondary | ICD-10-CM

## 2020-07-21 DIAGNOSIS — Z90722 Acquired absence of ovaries, bilateral: Secondary | ICD-10-CM

## 2020-07-21 DIAGNOSIS — M545 Low back pain, unspecified: Secondary | ICD-10-CM

## 2020-07-21 DIAGNOSIS — E538 Deficiency of other specified B group vitamins: Secondary | ICD-10-CM

## 2020-07-21 DIAGNOSIS — M79641 Pain in right hand: Secondary | ICD-10-CM | POA: Diagnosis not present

## 2020-07-21 DIAGNOSIS — G8929 Other chronic pain: Secondary | ICD-10-CM

## 2020-07-21 DIAGNOSIS — G43519 Persistent migraine aura without cerebral infarction, intractable, without status migrainosus: Secondary | ICD-10-CM

## 2020-07-21 DIAGNOSIS — M5030 Other cervical disc degeneration, unspecified cervical region: Secondary | ICD-10-CM

## 2020-07-21 DIAGNOSIS — G894 Chronic pain syndrome: Secondary | ICD-10-CM

## 2020-07-21 DIAGNOSIS — Z9079 Acquired absence of other genital organ(s): Secondary | ICD-10-CM

## 2020-07-21 DIAGNOSIS — F32A Depression, unspecified: Secondary | ICD-10-CM

## 2020-07-21 DIAGNOSIS — M7062 Trochanteric bursitis, left hip: Secondary | ICD-10-CM

## 2020-07-21 DIAGNOSIS — Z90710 Acquired absence of both cervix and uterus: Secondary | ICD-10-CM

## 2020-07-21 DIAGNOSIS — F419 Anxiety disorder, unspecified: Secondary | ICD-10-CM

## 2020-07-21 DIAGNOSIS — G4709 Other insomnia: Secondary | ICD-10-CM

## 2020-07-21 NOTE — Patient Instructions (Signed)
Iliotibial Band Syndrome Rehab Ask your health care provider which exercises are safe for you. Do exercises exactly as told by your health care provider and adjust them as directed. It is normal to feel mild stretching, pulling, tightness, or discomfort as you do these exercises. Stop right away if you feel sudden pain or your pain gets significantly worse. Do not begin these exercises until told by your health care provider. Stretching and range-of-motion exercises These exercises warm up your muscles and joints and improve the movement andflexibility of your hip and pelvis. Quadriceps stretch, prone  Lie on your abdomen (prone position) on a firm surface, such as a bed or padded floor. Bend your left / right knee and reach back to hold your ankle or pant leg. If you cannot reach your ankle or pant leg, loop a belt around your foot and grab the belt instead. Gently pull your heel toward your buttocks. Your knee should not slide out to the side. You should feel a stretch in the front of your thigh and knee (quadriceps). Hold this position for __________ seconds. Repeat __________ times. Complete this exercise __________ times a day. Iliotibial band stretch An iliotibial band is a strong band of muscle tissue that runs from the outer side of your hip to the outer side of your thigh and knee. Lie on your side with your left / right leg in the top position. Bend both of your knees and grab your left / right ankle. Stretch out your bottom arm to help you balance. Slowly bring your top knee back so your thigh goes behind your trunk. Slowly lower your top leg toward the floor until you feel a gentle stretch on the outside of your left / right hip and thigh. If you do not feel a stretch and your knee will not fall farther, place the heel of your other foot on top of your knee and pull your knee down toward the floor with your foot. Hold this position for __________ seconds. Repeat __________ times.  Complete this exercise __________ times a day. Strengthening exercises These exercises build strength and endurance in your hip and pelvis. Enduranceis the ability to use your muscles for a long time, even after they get tired. Straight leg raises, side-lying This exercise strengthens the muscles that rotate the leg at the hip and move it away from your body (hip abductors). Lie on your side with your left / right leg in the top position. Lie so your head, shoulder, hip, and knee line up. You may bend your bottom knee to help you balance. Roll your hips slightly forward so your hips are stacked directly over each other and your left / right knee is facing forward. Tense the muscles in your outer thigh and lift your top leg 4-6 inches (10-15 cm). Hold this position for __________ seconds. Slowly lower your leg to return to the starting position. Let your muscles relax completely before doing another repetition. Repeat __________ times. Complete this exercise __________ times a day. Leg raises, prone This exercise strengthens the muscles that move the hips backward (hip extensors). Lie on your abdomen (prone position) on your bed or a firm surface. You can put a pillow under your hips if that is more comfortable for your lower back. Bend your left / right knee so your foot is straight up in the air. Squeeze your buttocks muscles and lift your left / right thigh off the bed. Do not let your back arch. Tense your thigh   muscle as hard as you can without increasing any knee pain. Hold this position for __________ seconds. Slowly lower your leg to return to the starting position and allow it to relax completely. Repeat __________ times. Complete this exercise __________ times a day. Hip hike Stand sideways on a bottom step. Stand on your left / right leg with your other foot unsupported next to the step. You can hold on to a railing or wall for balance if needed. Keep your knees straight and your  torso square. Then lift your left / right hip up toward the ceiling. Slowly let your left / right hip lower toward the floor, past the starting position. Your foot should get closer to the floor. Do not lean or bend your knees. Repeat __________ times. Complete this exercise __________ times a day. This information is not intended to replace advice given to you by your health care provider. Make sure you discuss any questions you have with your healthcare provider. Document Revised: 03/13/2019 Document Reviewed: 03/13/2019 Elsevier Patient Education  2022 [employer] Hand Exercises Hand exercises can be helpful for almost anyone. These exercises can strengthen the hands, improve flexibility and movement, and increase blood flow to the hands. These results can make work and daily tasks easier. Hand exercises can be especially helpful for people who have joint pain from arthritis or have nerve damage from overuse (carpal tunnel syndrome). These exercises can also help people who have injured a hand. Exercises Most of these hand exercises are gentle stretching and motion exercises. It is usually safe to do them often throughout the day. Warming up your hands before exercise may help to reduce stiffness. You can do this with gentle massage orby placing your hands in warm water for 10-15 minutes. It is normal to feel some stretching, pulling, tightness, or mild discomfort as you begin new exercises. This will gradually improve. Stop an exercise right away if you feel sudden, severe pain or your pain gets worse. Ask your healthcare provider which exercises are best for you. Knuckle bend or "claw" fist Stand or sit with your arm, hand, and all five fingers pointed straight up. Make sure to keep your wrist straight during the exercise. Gently bend your fingers down toward your palm until the tips of your fingers are touching the top of your palm. Keep your big knuckle straight and just bend the small  knuckles in your fingers. Hold this position for __________ seconds. Straighten (extend) your fingers back to the starting position. Repeat this exercise 5-10 times with each hand. Full finger fist Stand or sit with your arm, hand, and all five fingers pointed straight up. Make sure to keep your wrist straight during the exercise. Gently bend your fingers into your palm until the tips of your fingers are touching the middle of your palm. Hold this position for __________ seconds. Extend your fingers back to the starting position, stretching every joint fully. Repeat this exercise 5-10 times with each hand. Straight fist Stand or sit with your arm, hand, and all five fingers pointed straight up. Make sure to keep your wrist straight during the exercise. Gently bend your fingers at the big knuckle, where your fingers meet your hand, and the middle knuckle. Keep the knuckle at the tips of your fingers straight and try to touch the bottom of your palm. Hold this position for __________ seconds. Extend your fingers back to the starting position, stretching every joint fully. Repeat this exercise 5-10 times with each hand. Tabletop   Stand or sit with your arm, hand, and all five fingers pointed straight up. Make sure to keep your wrist straight during the exercise. Gently bend your fingers at the big knuckle, where your fingers meet your hand, as far down as you can while keeping the small knuckles in your fingers straight. Think of forming a tabletop with your fingers. Hold this position for __________ seconds. Extend your fingers back to the starting position, stretching every joint fully. Repeat this exercise 5-10 times with each hand. Finger spread Place your hand flat on a table with your palm facing down. Make sure your wrist stays straight as you do this exercise. Spread your fingers and thumb apart from each other as far as you can until you feel a gentle stretch. Hold this position for  __________ seconds. Bring your fingers and thumb tight together again. Hold this position for __________ seconds. Repeat this exercise 5-10 times with each hand. Making circles Stand or sit with your arm, hand, and all five fingers pointed straight up. Make sure to keep your wrist straight during the exercise. Make a circle by touching the tip of your thumb to the tip of your index finger. Hold for __________ seconds. Then open your hand wide. Repeat this motion with your thumb and each finger on your hand. Repeat this exercise 5-10 times with each hand. Thumb motion Sit with your forearm resting on a table and your wrist straight. Your thumb should be facing up toward the ceiling. Keep your fingers relaxed as you move your thumb. Lift your thumb up as high as you can toward the ceiling. Hold for __________ seconds. Bend your thumb across your palm as far as you can, reaching the tip of your thumb for the small finger (pinkie) side of your palm. Hold for __________ seconds. Repeat this exercise 5-10 times with each hand. Grip strengthening  Hold a stress ball or other soft ball in the middle of your hand. Slowly increase the pressure, squeezing the ball as much as you can without causing pain. Think of bringing the tips of your fingers into the middle of your palm. All of your finger joints should bend when doing this exercise. Hold your squeeze for __________ seconds, then relax. Repeat this exercise 5-10 times with each hand. Contact a health care provider if: Your hand pain or discomfort gets much worse when you do an exercise. Your hand pain or discomfort does not improve within 2 hours after you exercise. If you have any of these problems, stop doing these exercises right away. Do not do them again unless your health care provider says that you can. Get help right away if: You develop sudden, severe hand pain or swelling. If this happens, stop doing these exercises right away. Do not do  them again unless your health care provider says that you can. This information is not intended to replace advice given to you by your health care provider. Make sure you discuss any questions you have with your healthcare provider. Document Revised: 04/26/2018 Document Reviewed: 01/04/2018 Elsevier Patient Education  2022 [employer] Inc.  

## 2020-07-22 ENCOUNTER — Encounter (HOSPITAL_COMMUNITY): Payer: Medicaid Other

## 2020-07-28 ENCOUNTER — Other Ambulatory Visit: Payer: Self-pay | Admitting: Physical Medicine and Rehabilitation

## 2020-08-06 ENCOUNTER — Ambulatory Visit: Payer: Medicaid Other | Admitting: Rheumatology

## 2020-08-27 ENCOUNTER — Other Ambulatory Visit: Payer: Self-pay

## 2020-08-27 ENCOUNTER — Emergency Department (HOSPITAL_COMMUNITY)
Admission: EM | Admit: 2020-08-27 | Discharge: 2020-08-27 | Disposition: A | Discharge status: Home / Self Care | Payer: Medicaid Other | Attending: Emergency Medicine | Admitting: Emergency Medicine

## 2020-08-27 ENCOUNTER — Encounter (HOSPITAL_COMMUNITY): Payer: Self-pay

## 2020-08-27 DIAGNOSIS — Z7951 Long term (current) use of inhaled steroids: Secondary | ICD-10-CM | POA: Diagnosis not present

## 2020-08-27 DIAGNOSIS — I10 Essential (primary) hypertension: Secondary | ICD-10-CM | POA: Insufficient documentation

## 2020-08-27 DIAGNOSIS — J45909 Unspecified asthma, uncomplicated: Secondary | ICD-10-CM | POA: Insufficient documentation

## 2020-08-27 DIAGNOSIS — Z91040 Latex allergy status: Secondary | ICD-10-CM | POA: Insufficient documentation

## 2020-08-27 DIAGNOSIS — R55 Syncope and collapse: Secondary | ICD-10-CM | POA: Diagnosis not present

## 2020-08-27 DIAGNOSIS — F17210 Nicotine dependence, cigarettes, uncomplicated: Secondary | ICD-10-CM | POA: Insufficient documentation

## 2020-08-27 LAB — I-STAT CHEM 8, ED
BUN: 11 mg/dL (ref 6–20)
Calcium, Ion: 1.15 mmol/L (ref 1.15–1.40)
Chloride: 107 mmol/L (ref 98–111)
Creatinine, Ser: 0.7 mg/dL (ref 0.44–1.00)
Glucose, Bld: 85 mg/dL (ref 70–99)
HCT: 42 % (ref 36.0–46.0)
Hemoglobin: 14.3 g/dL (ref 12.0–15.0)
Potassium: 5 mmol/L (ref 3.5–5.1)
Sodium: 139 mmol/L (ref 135–145)
TCO2: 28 mmol/L (ref 22–32)

## 2020-08-27 MED ORDER — ACETAMINOPHEN 500 MG PO TABS
1000.0000 mg | ORAL_TABLET | Freq: Once | ORAL | Status: AC
  Administered 2020-08-27: 1000 mg via ORAL
  Filled 2020-08-27: qty 2

## 2020-08-27 MED ORDER — SODIUM CHLORIDE 0.9 % IV BOLUS
1000.0000 mL | Freq: Once | INTRAVENOUS | Status: AC
  Administered 2020-08-27: 1000 mL via INTRAVENOUS

## 2020-08-27 MED ORDER — HYDROXYZINE HCL 25 MG PO TABS: 25.0000 mg | ORAL_TABLET | Freq: Four times a day (QID) | ORAL | 0 refills | Status: DC | PRN

## 2020-08-27 MED ORDER — HYDROXYZINE HCL 25 MG PO TABS
25.0000 mg | ORAL_TABLET | Freq: Once | ORAL | Status: AC
  Administered 2020-08-27: 25 mg via ORAL
  Filled 2020-08-27: qty 1

## 2020-08-27 MED ORDER — HYDROXYZINE HCL 25 MG PO TABS: 25.0000 mg | ORAL_TABLET | Freq: Four times a day (QID) | ORAL | 0 refills | Status: AC | PRN

## 2020-08-27 NOTE — ED Triage Notes (Signed)
Pt reports fall trying get off toilet this am. Patient states syncope with LOC(reports getting dizzy and seeing black dots prior to LOC), reports generalized pain

## 2020-08-27 NOTE — ED Provider Notes (Signed)
[facility]-EMERGENCY DEPT Provider Note   CSN: [id number] Arrival date & time: 08/27/20  1115     History Chief Complaint  Patient presents with   Loss of Consciousness    Tanya Krause is a 35 y.o. female.  The history is provided by the patient and medical records. No language interpreter was used.  Loss of Consciousness  35 year old female with history of fibromyalgias, chronic pain syndrome, hypertension, migraine, who is well-known to the ER with a care plan presenting for evaluation of loss of consciousness.  Patient report this morning when she went to use the bathroom to urinate, however she stood up she felt lightheadedness and subsequently falling to the floor with a syncopal episode.  She is complaining of pain throughout her whole entire body including her head neck chest back buttock leg and right great toe.  She does not think she has any broken bone.  She denies any recent medication changes but does admits to taking pain medication as well as blood pressure medication this morning.  She denies any recent sickness.  Pain is sharp achy throbbing 7 out of 10 and worsening with movement.  No shortness of breath or dysuria.  She denies any focal numbness or focal weakness.  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 04/08/2019   Trochanteric bursitis  of left hip 03/11/2019   S/P hysterectomy 02/06/2019   S/P TAH-BSO (total abdominal hysterectomy and bilateral salpingo-oophorectomy) 02/06/2019   Myofascial pain dysfunction syndrome 09/27/2018   Chronic bilateral back pain 09/03/2018   Colitis 08/12/2015   Oral thrush 11/18/2014   B12 deficiency 11/17/2014   Chronic pain syndrome 11/17/2014   Numbness 11/17/2014   Intractable headache 11/17/2014   Intractable chronic migraine without aura and without status migrainosus 11/17/2014   Migraines 10/11/2014   Primary fibromyalgia syndrome 10/11/2014   Lower extremity weakness 10/11/2014   Vitamin B12 deficiency 10/11/2014   Anxiety state 10/11/2014   Nausea & vomiting 10/08/2014   Headache, tension type, chronic 09/07/2012   Abdominal pain 06/15/2012    Past Surgical History:  Procedure Laterality Date   HYSTERECTOMY ABDOMINAL WITH SALPINGO-OOPHORECTOMY Bilateral 02/06/2019   Procedure: HYSTERECTOMY ABDOMINAL WITH BILATERAL SALPINGO-OOPHORECTOMY;  Surgeon: Lazaro Arms, MD;  Location: [facility];  Service: Gynecology;  Laterality: Bilateral;   kidney infections     TUBAL LIGATION     WISDOM TOOTH EXTRACTION       OB History     Gravida  4   Para  4   Term  1   Preterm  3   AB      Living  4      SAB      IAB      Ectopic      Multiple      Live Births  4  Family History  Problem Relation Age of Onset   Hypertension Mother    Diabetes Mother    Heart failure Father    Colon cancer Paternal Grandfather    Healthy Daughter    Healthy Son    Healthy Son    Healthy Son     Social History   Tobacco Use   Smoking status: Some Days    Packs/day: 0.14    Years: 5.00    Pack years: 0.70    Types: Cigarettes   Smokeless tobacco: Never   Tobacco comments:    3 cig daily  Vaping Use   Vaping Use: Former  Substance Use Topics   Alcohol use: Not Currently    Comment: occ   Drug use: Not Currently    Types: Marijuana    Comment: last used  september 2020    Home Medications Prior to Admission medications   Medication Sig Start Date End Date Taking? Authorizing Provider  HYDROcodone-acetaminophen (NORCO/VICODIN) 5-325 MG tablet TAKE 1 TABLET BY MOUTH 3 (THREE) TIMES DAILY AS NEEDED. FOR PAIN 07/29/20   Lovorn, Aundra Millet, MD  acetaminophen (TYLENOL) 500 MG tablet Take 1,000 mg by mouth every 6 (six) hours as needed.    [provider]  albuterol (ACCUNEB) 1.25 MG/3ML nebulizer solution Take 1 ampule by nebulization every 6 (six) hours as needed for wheezing or shortness of breath.  12/03/19   [provider]  albuterol (PROVENTIL) (2.5 MG/3ML) 0.083% nebulizer solution Take 3 mLs (2.5 mg total) by nebulization every 6 (six) hours as needed for wheezing or shortness of breath. 06/20/20   Lorre Nick, MD  busPIRone (BUSPAR) 7.5 MG tablet Take 7.5 mg by mouth 3 (three) times daily. 03/23/20   [provider]  cyclobenzaprine (FLEXERIL) 10 MG tablet TAKE ONE TABLET BY MOUTH THREE TIMES DAILY AS NEEDED FOR MUSCLE SPASMS. Patient taking differently: Take 10 mg by mouth 3 (three) times daily as needed for muscle spasms. T 09/30/19   Lovorn, Aundra Millet, MD  diclofenac (VOLTAREN) 50 MG EC tablet Take 1 tablet (50 mg total) by mouth 2 (two) times daily as needed (for swelling of hands/tendinitis). 03/20/20   Lovorn, Aundra Millet, MD  diphenhydrAMINE (BENADRYL) 25 MG tablet Take 25 mg by mouth every 6 (six) hours as needed for itching or allergies.    [provider]  EMGALITY 120 MG/ML SOAJ Inject 1 each into the skin every 30 (thirty) days. No matter what- to decrease migraines- 04/24/20   Lovorn, Aundra Millet, MD  FLOVENT HFA 220 MCG/ACT inhaler Inhale 2 puffs into the lungs 2 (two) times daily. 12/03/19   [provider]  folic acid (FOLVITE) 1 MG tablet Take 1 tablet (1 mg total) by mouth daily. 10/17/14   Jerald Kief, MD  hydrOXYzine (ATARAX/VISTARIL) 25 MG tablet TAKE ONE TABLET BY MOUTH EVERY 8 HOURS AS NEEDED 07/01/20    Lovorn, Aundra Millet, MD  lidocaine (LIDODERM) 5 % Place 3 patches onto the skin daily. Remove & Discard patch within 12 hours or as directed by MD Patient taking differently: Place 1 patch onto the skin daily. 05/13/19   Lovorn, Aundra Millet, MD  nystatin (MYCOSTATIN) 100000 UNIT/ML suspension TAKE ONE TEASPOONFUL ( ) BY MOUTH FOUR TIMES DAILY 10/10/19   Lovorn, Aundra Millet, MD  pantoprazole (PROTONIX) 40 MG tablet Take 1 tablet (40 mg total) by mouth daily. For reflux/heartburn that's daily. 04/24/20   Lovorn, Aundra Millet, MD  promethazine (PHENERGAN) 25 MG tablet Take 25 mg by mouth every 8 (eight) hours as needed for nausea  or vomiting.  11/27/19   [provider]  promethazine Jena GaMount Sinai Hospital [id number] G68Margar77eeFive ORiver Crest [phone] Health Harris Methodist HoInetta FerEpimenio FoT35hVis161D(80MAbner84 [facility]lor [facility] Sports SurgCanonsburg GenerJe24L979D671-(8MaAbner G(828)Margar18eeMethodist MansOCottonwoodsouthwestern E24L629D(315)8Ma51Abner G(726)Margar3eOKaiser Foundation Hospital - San LeandLuett(6524L97D(843) 6MaAbner G417-Margar15eeOJohn H Stroger Jr [phone]ner G60Margar61eeSelect Specialty HoOTwin Cities Ambulatory Surgery CJena GaChristus Trinity M24L(872D([phone])7Ma(90Abner G(857) Margar58eeOlin E. Teague VeteOSaint Joseph Hospital [phone] GaWel24L216D206-(6Ma(57Abner G(315) Margar63eeErlOSpanish Hills Surgery Center LJena G24L770D330-3Ma50Abner G251-Margar26eeLima MeOLady Of The Sea General HospitLuett319Jena 24L43D304Ma54Abner G724-Margar66eeArkOCarroll County Memorial HospitLuett240-405Roland Rac28mBaylor Surgicare At Baylor Pla24L(872D([phone])4Ma(620Abner G450-Margar52eeHealdsbuONaval Medical Center [phone] MemJena Ga24L330D619Ma57Abner G57Margar36eeHarrOSt Rita'S Medical [phone] DeRolandJena GaConnecticut Orthopaedic Speciali24L95D209-7Ma78Abner G223-Margar40eeBaOSelect Speciality Hospital Of MiaLuett26224L(336D408-2Ma(902Abner G425-Margar33eeCuLPepeOPhysicians Surgery Services Luett([phone] G902-Margar31eeThe Eye SurgeOMorristown Memorial [phone] Horizons Of Tr24L419D946Ma82Abner G615-Margar64eeRidgewood Surgery And OOzarks Community Hospital Of GravetLuett571-3224L310D(412)6Ma94Abner G845-Margar82eeMclOPutnam County HospitLJena GaRegio24L22D548-(93Ma(76Abner G478-Margar72eeSonomaOConroe Tx Endoscopy Asc LLC Dba River Oaks Endoscopy [phone](732D212Ma(95Abner G289-Margar32eeTristar Southern OSmyth County Community [phone] Rac62Va CJe24L205D586 4Ma74Abner G(647) Margar60eeKaweah Delta RehOUsmd Hospital At [phone] GaThe Heart Ho24LD805-5Ma51Abner G774-Margar2eeOphthalmology Ltd EyOThe Physicians Centre HospitLuett909-22Jena 24L450D817-(8Ma(91Abner G413-Margar36eeKindred OPeninsula HospitLuett317-22953-30MJena GaMichigan24L40D(210Ma32Abner G412-Margar74eeHarborsiOKaweah Delta Rehabilitation HospitLuett385-06849-43MNaval MeJe24L601D251 3Ma78Abner G215-Margar78eeKerrville AmbulatorOSunrise Ambulatory Surgical [phone] PoinJena Ga24L4D918 (3Ma25Abner G(701)Margar49eeMosaic LifOHouston Methodist Willowbrook HospitL24L(606D813-6MaAbner G228-Margar47eeTurbeville Correctional IOAscension Providence Health [phone] Sound Gastroetnerology At KirklaRoland Rac9AveJena24L([phone])D4Ma71Abner G831-Margar35eeDigestive COSelect Specialty Hospital 24L(480D(810) (8Ma33Abner G424 Margar97eeEncompass Health RehabilitationOHudson Valley Endoscopy CentLuett([phone] RehJ24L873D360-9Ma63Abner G228-Margar29eePoOPoudre Valley HospitLuett484-49564-22MBaptist Health MediRoJen24L515D716-(3Ma(831Abner G917-Margar42eeSheppard And OSpecialty Orthopaedics Surgery CentLuettJen24L581D210-4Ma91Abner G425-Margar33eeBaptist HospitalOWest Haven Va Medical [id number](504929-Margaree [phone] GaTower Outpatient Surgery Center I24L(32D655Ma25Abner G42Margar57eeOOcala Fl Orthopaedic Asc [phone] RegRJena24D828-5Ma38Abner G559-Margar13eeSouth SunflOEncompass Health Harmarville Rehabilitation HospitLuett640-24L801D409-(7MaAbner G(401)Margar57eeBall OutpatienOHanford Surgery [phone] Health PhysiciansRolJena G24L(215D757 2Ma68Abner G(365) Margar77eeSouth JerseOWhiting Forensic [phone] EstaJena G24L(763D740-4Ma(973Abner G(817) Margar57eeMassachuseOSanta Cruz Endoscopy Center [phone] GaC24L520D(380) 2Ma(80Abner G(878)Margar71eeMercyOEastern Maine Medical CentLuett324L616D854 4Ma51Abner G934 Margar23eeWestwood/Pembroke HeOSkyliEpimenio FoT82hVis161LueRoland Rac3Megan Manser [phone] Rac5Commonwea<MEASUREMENT60OlegClGlenniLa AG>tan (IMITREX) 100 MG tablet Take 1 tablet (100 mg total) by mouth every 2 (two) hours as needed for migraine. May repeat in 2 hours if headache persists or recurs. 06/01/20   Lovorn, Megan, MD  venlafaxine (EFFEXOR) 37.5 MG tablet Take 37.5 mg by mouth every morning. 02/20/20   [provider]    Allergies    Metoclopramide, Tomato, Aspirin, Doxycycline, Hydromorphone hcl, Ibuprofen, Ondansetron, Other, Shellfish allergy, Toradol [ketorolac tromethamine], Latex, and Tramadol  Review of Systems   Review of Systems  Cardiovascular:  Positive for syncope.  All other systems reviewed and are negative.  Physical Exam Updated Vital Signs BP (!) 135/91   Pulse (!) 101   Temp 97.6 F (36.4 C) (Oral)   Resp 20   Ht 5' (1.524 m)   Wt 83 kg   LMP 12/04/2018   SpO2 94%   BMI 35.74 kg/m   Physical Exam Vitals and nursing note reviewed.  Constitutional:      General: She is not in acute distress.    Appearance: She is well-developed.  HENT:     Head: Normocephalic and atraumatic.  Eyes:     Extraocular Movements: Extraocular movements intact.     Conjunctiva/sclera:  Conjunctivae normal.     Pupils: Pupils are equal, round, and reactive to light.  Cardiovascular:     Rate and Rhythm: Normal rate and regular rhythm.  Pulmonary:     Effort: Pulmonary effort is normal.  Abdominal:     General: Abdomen is flat.     Palpations: Abdomen is soft.  Musculoskeletal:        General: Tenderness (Patient expressed tenderness throughout the entire body even on very gentle palpation.  No obvious signs of deformity bruising or signs of trauma on body exam) present.     Cervical back: Normal range of motion and neck supple. No tenderness.  Skin:    Findings: No rash.  Neurological:     Mental Status: She is alert and oriented to person, place, and time.  Psychiatric:        Mood and Affect: Mood normal.    ED Results / Procedures / Treatments   Labs (all labs ordered are listed, but only abnormal results are displayed) Labs Reviewed  I-STAT CHEM 8, ED    EKG None  Date: 08/27/2020  Rate: 104  Rhythm: sinus tachycardia  QRS Axis: normal  Intervals: normal  ST/T Wave abnormalities: normal  Conduction Disutrbances: none  Narrative Interpretation:   Old EKG Reviewed: No significant changes noted EKG independently reviewed and interpreted by me.   Radiology No results found.  Procedures Procedures   Medications Ordered in ED Medications  sodium chloride 0.9 % bolus 1,000 mL (1,000 mLs Intravenous New Bag/Given 08/27/20 1353)  acetaminophen (TYLENOL) tablet 1,000 mg (1,000 mg Oral Given 08/27/20 1456)  hydrOXYzine (ATARAX/VISTARIL) tablet 25 mg (25 mg Oral Given 08/27/20 1508)    ED Course  I have reviewed the triage vital signs and the nursing notes.  Pertinent labs & imaging results that were available during my care of the patient were reviewed by me and considered in my medical decision making (see chart for details).    MDM Rules/Calculators/A&P  BP (!) 127/103   Pulse 97   Temp 97.6 F (36.4 C) (Oral)    Resp 20   Ht 5' (1.524 m)   Wt 83 kg   LMP 12/04/2018   SpO2 100%   BMI 35.74 kg/m   Final Clinical Impression(s) / ED Diagnoses Final diagnoses:  Syncope and collapse    Rx / DC Orders ED Discharge Orders          Ordered    hydrOXYzine (ATARAX/VISTARIL) 25 MG tablet  Every 6 hours PRN        08/27/20 1539           12:59 PM Patient reports she had a syncopal episode when she stood up from using the bathroom prior to arrival.  No obvious signs of significant injury on exam.  She is able to communicate without difficulty.  She appears to be in no acute discomfort.  I do not think advanced imaging is indicated at this time.  EKG without concerning arrhythmia.  Vital signs stable.  Patient overall stable to be discharged home with antianxiety medication Vistaril   Fayrene Helper, PA-C 08/27/20 1540    Koleen Distance, MD 08/27/20 1556

## 2020-08-27 NOTE — Discharge Instructions (Addendum)
Your labs are normal.  I have prescribed antianxiety medication to use as needed.  Follow-up closely with your doctor for further care.

## 2020-08-27 NOTE — ED Notes (Signed)
Patient states that she is unable to complete orthostatic vital signs at this time, will attempt to collect at a later time.

## 2020-08-28 ENCOUNTER — Telehealth: Payer: Self-pay

## 2020-08-28 MED ORDER — HYDROCODONE-ACETAMINOPHEN 5-325 MG PO TABS: 1.0000 | ORAL_TABLET | Freq: Three times a day (TID) | ORAL | 0 refills | Status: DC | PRN

## 2020-08-28 NOTE — Telephone Encounter (Signed)
Tanya Krause is requesting refill on her hydrocodone last filled per pmp 07/29/2020 . Pt does have an upcoming appt on Sept 12.

## 2020-09-23 ENCOUNTER — Other Ambulatory Visit: Payer: Self-pay | Admitting: Physical Medicine and Rehabilitation

## 2020-09-24 ENCOUNTER — Other Ambulatory Visit: Payer: Self-pay | Admitting: Physical Medicine and Rehabilitation

## 2020-09-28 ENCOUNTER — Encounter: Payer: Medicaid Other | Admitting: Physical Medicine and Rehabilitation

## 2020-10-16 ENCOUNTER — Encounter (HOSPITAL_COMMUNITY): Payer: Self-pay

## 2020-10-16 ENCOUNTER — Emergency Department (HOSPITAL_COMMUNITY): Payer: Medicaid Other

## 2020-10-16 ENCOUNTER — Encounter: Payer: Medicaid Other | Admitting: Physical Medicine and Rehabilitation

## 2020-10-16 ENCOUNTER — Emergency Department (HOSPITAL_COMMUNITY)
Admission: EM | Admit: 2020-10-16 | Discharge: 2020-10-16 | Disposition: A | Discharge status: Home / Self Care | Payer: Medicaid Other | Attending: Emergency Medicine | Admitting: Emergency Medicine

## 2020-10-16 ENCOUNTER — Other Ambulatory Visit: Payer: Self-pay

## 2020-10-16 DIAGNOSIS — R112 Nausea with vomiting, unspecified: Secondary | ICD-10-CM | POA: Diagnosis not present

## 2020-10-16 DIAGNOSIS — I10 Essential (primary) hypertension: Secondary | ICD-10-CM | POA: Diagnosis not present

## 2020-10-16 DIAGNOSIS — Z7951 Long term (current) use of inhaled steroids: Secondary | ICD-10-CM | POA: Insufficient documentation

## 2020-10-16 DIAGNOSIS — Z79899 Other long term (current) drug therapy: Secondary | ICD-10-CM | POA: Diagnosis not present

## 2020-10-16 DIAGNOSIS — G8929 Other chronic pain: Secondary | ICD-10-CM | POA: Insufficient documentation

## 2020-10-16 DIAGNOSIS — R509 Fever, unspecified: Secondary | ICD-10-CM | POA: Insufficient documentation

## 2020-10-16 DIAGNOSIS — Z20822 Contact with and (suspected) exposure to covid-19: Secondary | ICD-10-CM | POA: Insufficient documentation

## 2020-10-16 DIAGNOSIS — F17210 Nicotine dependence, cigarettes, uncomplicated: Secondary | ICD-10-CM | POA: Diagnosis not present

## 2020-10-16 DIAGNOSIS — Z91040 Latex allergy status: Secondary | ICD-10-CM | POA: Diagnosis not present

## 2020-10-16 DIAGNOSIS — J45909 Unspecified asthma, uncomplicated: Secondary | ICD-10-CM | POA: Diagnosis not present

## 2020-10-16 DIAGNOSIS — R1030 Lower abdominal pain, unspecified: Secondary | ICD-10-CM | POA: Diagnosis not present

## 2020-10-16 DIAGNOSIS — R519 Headache, unspecified: Secondary | ICD-10-CM | POA: Diagnosis not present

## 2020-10-16 LAB — BASIC METABOLIC PANEL
Anion gap: 8 (ref 5–15)
BUN: 15 mg/dL (ref 6–20)
CO2: 26 mmol/L (ref 22–32)
Calcium: 10 mg/dL (ref 8.9–10.3)
Chloride: 107 mmol/L (ref 98–111)
Creatinine, Ser: 0.77 mg/dL (ref 0.44–1.00)
GFR, Estimated: >60 mL/min (ref >60)
Glucose, Bld: 84 mg/dL (ref 70–99)
Potassium: 4.1 mmol/L (ref 3.5–5.1)
Sodium: 141 mmol/L (ref 135–145)

## 2020-10-16 LAB — CBC WITH DIFFERENTIAL/PLATELET
Abs Immature Granulocytes: 0.04 10*3/uL (ref 0.00–0.07)
Basophils Absolute: 0.1 10*3/uL (ref 0.0–0.1)
Basophils Relative: 1 %
Eosinophils Absolute: 0.3 10*3/uL (ref 0.0–0.5)
Eosinophils Relative: 3 %
HCT: 36.1 % (ref 36.0–46.0)
Hemoglobin: 12.4 g/dL (ref 12.0–15.0)
Immature Granulocytes: 0 %
Lymphocytes Relative: 46 %
Lymphs Abs: 5.2 10*3/uL — ABNORMAL HIGH (ref 0.7–4.0)
MCH: 28.4 pg (ref 26.0–34.0)
MCHC: 34.3 g/dL (ref 30.0–36.0)
MCV: 82.8 fL (ref 80.0–100.0)
Monocytes Absolute: 0.7 10*3/uL (ref 0.1–1.0)
Monocytes Relative: 6 %
Neutro Abs: 4.9 10*3/uL (ref 1.7–7.7)
Neutrophils Relative %: 44 %
Platelets: 306 10*3/uL (ref 150–400)
RBC: 4.36 MIL/uL (ref 3.87–5.11)
RDW: 13 % (ref 11.5–15.5)
WBC: 11.2 10*3/uL — ABNORMAL HIGH (ref 4.0–10.5)
nRBC: 0 % (ref 0.0–0.2)

## 2020-10-16 LAB — RETICULOCYTES
Immature Retic Fract: 26.3 % — ABNORMAL HIGH (ref 2.3–15.9)
RBC.: 4.31 MIL/uL (ref 3.87–5.11)
Retic Count, Absolute: 67.2 10*3/uL (ref 19.0–186.0)
Retic Ct Pct: 1.6 % (ref 0.4–3.1)

## 2020-10-16 LAB — RESP PANEL BY RT-PCR (FLU A&B, COVID) ARPGX2
Influenza A by PCR: NEGATIVE
Influenza B by PCR: NEGATIVE
SARS Coronavirus 2 by RT PCR: NEGATIVE

## 2020-10-16 LAB — LACTIC ACID, PLASMA: Lactic Acid, Venous: 0.6 mmol/L (ref 0.5–1.9)

## 2020-10-16 MED ORDER — DIPHENHYDRAMINE HCL 50 MG/ML IJ SOLN
25.0000 mg | Freq: Once | INTRAMUSCULAR | Status: AC
  Administered 2020-10-16: 25 mg via INTRAVENOUS
  Filled 2020-10-16: qty 1

## 2020-10-16 MED ORDER — SODIUM CHLORIDE 0.9 % IV SOLN
25.0000 mg | Freq: Four times a day (QID) | INTRAVENOUS | Status: DC | PRN
  Administered 2020-10-16: 25 mg via INTRAVENOUS
  Filled 2020-10-16: qty 25

## 2020-10-16 MED ORDER — HYDROMORPHONE HCL 1 MG/ML IJ SOLN
0.5000 mg | INTRAMUSCULAR | Status: AC
  Administered 2020-10-16: 0.5 mg via INTRAVENOUS
  Filled 2020-10-16: qty 1

## 2020-10-16 MED ORDER — HYDROMORPHONE HCL 1 MG/ML IJ SOLN
1.0000 mg | INTRAMUSCULAR | Status: AC
  Administered 2020-10-16: 1 mg via INTRAVENOUS
  Filled 2020-10-16: qty 1

## 2020-10-16 NOTE — ED Triage Notes (Signed)
Pt arrived via POV, c/o fevers, n/v and SCC, pain in joints x3 days.

## 2020-10-16 NOTE — ED Provider Notes (Signed)
Emergency Medicine Provider Triage Evaluation Note  Tanya Krause , a 35 y.o. female  was evaluated in triage.  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  Patient reports that she developed a fever on Friday.  After that she started having diffuse joint pain as well as diffuse abdominal pain.  Patient endorses nausea and vomiting.  Patient reports feeling anxious.  Review of Systems  Positive: Fever, joint pain, abdominal pain, nausea, vomiting Negative: Chest pain, syncope, rhinorrhea, nasal congestion, sore throat, cough  Physical Exam  BP (!) 144/100 (BP Location: Left Arm)   Pulse (!) 108   Temp 97.8 F (36.6 C) (Oral)   Resp 16   LMP 12/04/2018   SpO2 100%  Gen:   Awake, no distress   Resp:  Normal effort  MSK:   Moves extremities without difficulty  Other:    Medical Decision Making  Medically screening exam initiated at 1:05 PM.  Appropriate orders placed.  Tanya Krause was informed that the remainder of the evaluation will be completed by another provider, this initial triage assessment does not replace that evaluation, and the importance of remaining in the ED until their evaluation is complete.  The patient appears stable so that the remainder of the work up may be completed by another provider.      Haskel Schroeder, PA-C 10/16/20 1306    Pollyann Savoy, MD 10/16/20 [phone]

## 2020-10-16 NOTE — Discharge Instructions (Addendum)
Try and keep yourself hydrated at home.  Take your medicines to help.  You do not have sickle cell disease.

## 2020-10-16 NOTE — ED Notes (Signed)
Pt is specifically requesting phenergan for nausea and her home migraine medications for her cluster migraine.  Sts zofran and other medications do not work.

## 2020-10-16 NOTE — ED Notes (Signed)
Pt is requesting additional pain medication.  Sts "if she can get a dose of morphine or something similar then she should be ok."

## 2020-10-16 NOTE — ED Provider Notes (Signed)
[facility]-EMERGENCY DEPT Provider Note   CSN: [id number] Arrival date & time: 10/16/20  1221     History Chief Complaint  Patient presents with   Sickle Cell Pain Crisis   Fever    Tanya Krause is a 35 y.o. female.   Sickle Cell Pain Crisis Associated symptoms: fever, nausea and vomiting   Associated symptoms: no chest pain, no congestion, no cough and no shortness of breath   Fever Associated symptoms: nausea and vomiting   Associated symptoms: no chest pain, no confusion, no congestion, no cough, no diarrhea and no rash   Patient states she has hemoglobin [place].  Also had a history of fibromyalgia and some chronic pain.  States for the last 3 days she has been having more pain.  States has been in her abdomen.  States that it is also in her muscles and joints.  States is in her back.  States her daughter has had nausea and vomiting and patient developed the same a couple days ago.  States she has been having fevers.  She is on chronic pain medicines through her pain clinic.  Patient states she is on hydrocodone that she takes about twice a day.  States she is also on oral Dilaudid.  However reviewing drug database it appears she has not been prescribed oral Dilaudid for at least a year. Patient sickle cell pain is being managed by the pain clinic and her primary care doctor.  States she was unable to get to the sickle cell clinic here because of "reasons".  States she is got a moved back down to [place] because the Medicaid there is better and the doctors there know her.    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 04/08/2019   Trochanteric bursitis of left hip 03/11/2019   S/P hysterectomy 02/06/2019   S/P TAH-BSO (total abdominal hysterectomy and bilateral salpingo-oophorectomy) 02/06/2019   Myofascial pain dysfunction syndrome 09/27/2018   Chronic bilateral back pain 09/03/2018   Colitis 08/12/2015   Oral thrush 11/18/2014   B12 deficiency 11/17/2014   Chronic pain syndrome 11/17/2014   Numbness 11/17/2014   Intractable headache 11/17/2014   Intractable chronic migraine without aura and without status migrainosus 11/17/2014   Migraines 10/11/2014   Primary fibromyalgia syndrome 10/11/2014   Lower extremity weakness 10/11/2014   Vitamin B12 deficiency 10/11/2014   Anxiety state 10/11/2014   Nausea & vomiting 10/08/2014   Headache, tension type, chronic 09/07/2012   Abdominal pain 06/15/2012    Past Surgical History:  Procedure Laterality Date   HYSTERECTOMY ABDOMINAL WITH SALPINGO-OOPHORECTOMY Bilateral 02/06/2019   Procedure: HYSTERECTOMY ABDOMINAL WITH BILATERAL SALPINGO-OOPHORECTOMY;  Surgeon: Lazaro Arms, MD;  Location: [facility];  Service: Gynecology;  Laterality: Bilateral;   kidney infections     TUBAL LIGATION     WISDOM TOOTH EXTRACTION  OB History     Gravida  4   Para  4   Term  1   Preterm  3   AB      Living  4      SAB      IAB      Ectopic      Multiple      Live Births  4           Family History  Problem Relation Age of Onset   Hypertension Mother    Diabetes Mother    Heart failure Father    Colon cancer Paternal Grandfather    Healthy Daughter    Healthy Son    Healthy Son    Healthy Son     Social History   Tobacco Use   Smoking status: Some Days     Packs/day: 0.14    Years: 5.00    Pack years: 0.70    Types: Cigarettes   Smokeless tobacco: Never   Tobacco comments:    3 cig daily  Vaping Use   Vaping Use: Former  Substance Use Topics   Alcohol use: Not Currently    Comment: occ   Drug use: Not Currently    Types: Marijuana    Comment: last used september 2020    Home Medications Prior to Admission medications   Medication Sig Start Date End Date Taking? Authorizing Provider  acetaminophen (TYLENOL) 500 MG tablet Take 1,000 mg by mouth every 6 (six) hours as needed for moderate pain.   Yes [provider]  albuterol (PROVENTIL) (2.5 MG/3ML) 0.083% nebulizer solution Take 3 mLs (2.5 mg total) by nebulization every 6 (six) hours as needed for wheezing or shortness of breath. 06/20/20  Yes Lorre Nick, MD  busPIRone (BUSPAR) 10 MG tablet Take 10 mg by mouth 3 (three) times daily. 09/23/20  Yes [provider]  cyclobenzaprine (FLEXERIL) 10 MG tablet TAKE ONE TABLET BY MOUTH THREE TIMES A DAY. AS NEEDED FOR MUSCLE SPASMS Patient taking differently: Take 10 mg by mouth 3 (three) times daily as needed for muscle spasms. 09/24/20  Yes Lovorn, Aundra Millet, MD  diclofenac (VOLTAREN) 50 MG EC tablet Take 1 tablet (50 mg total) by mouth 2 (two) times daily as needed (for swelling of hands/tendinitis). 03/20/20  Yes Lovorn, Aundra Millet, MD  diphenhydrAMINE (BENADRYL) 25 MG tablet Take 25 mg by mouth every 6 (six) hours as needed for itching or allergies.   Yes [provider]  EMGALITY 120 MG/ML SOAJ Inject 1 each into the skin every 30 (thirty) days. No matter what- to decrease migraines- 04/24/20  Yes Lovorn, Aundra Millet, MD  FLOVENT HFA 220 MCG/ACT inhaler Inhale 2 puffs into the lungs 2 (two) times daily. 12/03/19  Yes [provider]  FLUoxetine (PROZAC) 40 MG capsule Take 80 mg by mouth every morning. 09/23/20  Yes [provider]  folic acid (FOLVITE) 800 MCG tablet Take 400 mcg by mouth daily.   Yes [provider]  hydrochlorothiazide (HYDRODIURIL) 25 MG tablet Take 25 mg by mouth daily. 10/15/20  Yes [provider]  HYDROcodone-acetaminophen (NORCO/VICODIN) 5-325 MG tablet TAKE 1 TABLET BY MOUTH 3 (THREE) TIMES DAILY AS NEEDED. FOR PAIN 09/25/20  Yes Lovorn, Aundra Millet, MD  hydrOXYzine (ATARAX/VISTARIL) 25 MG tablet Take 1 tablet (25 mg total) by mouth every 6 (six) hours as needed for anxiety. 08/27/20  Yes Dartha Lodge, PA-C  nystatin (MYCOSTATIN) 100000 UNIT/ML suspension TAKE ONE TEASPOONFUL ( ) BY MOUTH FOUR TIMES DAILY  Patient taking differently: Use as directed 5 mLs in the mouth or throat 4 (four) times daily as needed (thrush). 10/10/19  Yes Lovorn, Aundra Millet, MD  pantoprazole (PROTONIX) 40 MG tablet TAKE ONE TABLET BY MOUTH ONCE DAILY FOR REFLUX OR HEARTBURN 09/25/20  Yes Lovorn, Megan, MD  PROAIR HFA 108 (90 Base) MCG/ACT inhaler Inhale 2-4 puffs into the lungs every 4 (four) hours as needed for wheezing or shortness of breath. 07/29/20  Yes [provider]  promethazine (PHENERGAN) 25 MG tablet Take 25 mg by mouth every 8 (eight) hours as needed for nausea or vomiting.  11/27/19  Yes [provider]  promethazine (PHENERGAN) 6.25 MG/5ML syrup TAKE TWO TEASPOONSFUL ( ) BY MOUTH FOUR TIMES DAILY AS NEEDED FOR NAUSEA AND VOMITING. 02/18/20  Yes Lovorn, Aundra Millet, MD  QUEtiapine (SEROQUEL) 50 MG tablet Take 50-100 mg by mouth at bedtime as needed (sleep). 08/11/20  Yes [provider]  REXULTI 0.5 MG TABS Take 1 tablet by mouth every evening. 09/24/20  Yes [provider]  SUMAtriptan (IMITREX) 100 MG tablet Take 1 tablet (100 mg total) by mouth every 2 (two) hours as needed for migraine. May repeat in 2 hours if headache persists or recurs. 06/01/20  Yes Lovorn, Aundra Millet, MD  venlafaxine (EFFEXOR) 37.5 MG tablet Take 37.5 mg by mouth every morning. 02/20/20  Yes [provider]  folic acid (FOLVITE) 1 MG tablet Take 1 tablet (1 mg total) by mouth  daily. Patient not taking: Reported on 10/16/2020 10/17/14   Jerald Kief, MD  lidocaine (LIDODERM) 5 % Place 3 patches onto the skin daily. Remove & Discard patch within 12 hours or as directed by MD Patient not taking: Reported on 10/16/2020 05/13/19   Genice Rouge, MD    Allergies    Metoclopramide, Tomato, Aspirin, Doxycycline, Hydromorphone hcl, Ibuprofen, Ondansetron, Other, Shellfish allergy, Toradol [ketorolac tromethamine], Latex, and Tramadol  Review of Systems   Review of Systems  Constitutional:  Positive for fever.  HENT:  Negative for congestion.   Respiratory:  Negative for cough and shortness of breath.   Cardiovascular:  Negative for chest pain.  Gastrointestinal:  Positive for abdominal pain, nausea and vomiting. Negative for diarrhea.  Genitourinary:  Negative for flank pain.  Musculoskeletal:  Negative for back pain.  Skin:  Negative for rash.  Neurological:  Negative for weakness.  Psychiatric/Behavioral:  Negative for confusion.    Physical Exam Updated Vital Signs BP (!) 150/96 (BP Location: Right Arm)   Pulse 88   Temp 98.2 F (36.8 C) (Oral)   Resp 18   LMP 12/04/2018   SpO2 99%   Physical Exam Vitals reviewed.  Constitutional:      Appearance: Normal appearance.  HENT:     Head: Normocephalic.  Eyes:     General: No scleral icterus. Cardiovascular:     Rate and Rhythm: Regular rhythm.  Abdominal:     Tenderness: There is abdominal tenderness.     Comments: Lower abdominal tenderness without rebound or guarding.  No hernia palpated.  Musculoskeletal:        General: Tenderness present.     Cervical back: Neck supple.  Skin:    General: Skin is warm.     Capillary Refill: Capillary refill takes less than 2 seconds.  Neurological:     Mental Status: She is alert and oriented to person, place, and time.    ED Results / Procedures / Treatments   Labs (all labs ordered are listed, but only abnormal results are  displayed) Labs Reviewed   CBC WITH DIFFERENTIAL/PLATELET - Abnormal; Notable for the following components:      Result Value   WBC 11.2 (*)    Lymphs Abs 5.2 (*)    All other components within normal limits  RETICULOCYTES - Abnormal; Notable for the following components:   Immature Retic Fract 26.3 (*)    All other components within normal limits  RESP PANEL BY RT-PCR (FLU A&B, COVID) ARPGX2  CULTURE, BLOOD (ROUTINE X 2)  CULTURE, BLOOD (ROUTINE X 2)  BASIC METABOLIC PANEL  LACTIC ACID, PLASMA  LACTIC ACID, PLASMA    EKG EKG Interpretation  Date/Time:  Friday October 16 2020 15:24:49 EDT Ventricular Rate:  90 PR Interval:  134 QRS Duration: 92 QT Interval:  398 QTC Calculation: 487 R Axis:   63 Text Interpretation: Sinus rhythm Borderline prolonged QT interval Confirmed by Benjiman Core [phone]) on 10/16/2020 7:10:03 PM  Radiology DG Chest Portable 1 View  Result Date: 10/16/2020 CLINICAL DATA:  Fever. EXAM: PORTABLE CHEST 1 VIEW COMPARISON:  Chest x-ray 06/24/2020. FINDINGS: Heart size is upper limits of normal, unchanged. The lungs and costophrenic angles are clear. There is no pneumothorax. No acute fractures are seen. IMPRESSION: No active disease. Electronically Signed   By: Darliss Cheney M.D.   On: 10/16/2020 16:14    Procedures Procedures   Medications Ordered in ED Medications  promethazine (PHENERGAN) 25 mg in sodium chloride 0.9 % 50 mL IVPB (0 mg Intravenous Stopped 10/16/20 1856)  HYDROmorphone (DILAUDID) injection 0.5 mg (0.5 mg Intravenous Given 10/16/20 1549)  HYDROmorphone (DILAUDID) injection 1 mg (1 mg Intravenous Given 10/16/20 1654)  diphenhydrAMINE (BENADRYL) injection 25 mg (25 mg Intravenous Given 10/16/20 1549)    ED Course  I have reviewed the triage vital signs and the nursing notes.  Pertinent labs & imaging results that were available during my care of the patient were reviewed by me and considered in my medical decision making (see chart for details).    MDM  Rules/Calculators/A&P                           Patient with pain fevers.  Nausea and vomiting.  I think likely the same thing that her daughter had.  Doubt severe intra-abdominal infection.  Reviewing records it appears that the patient does not actually have sickle cell disease.  Had hemoglobin electrophoresis September 2016.  Only showed hemoglobin C trait  Feels better after treatment.  Headache also treated.  Patient informed that she does not have sickle cell disease.  Patient states that she was told she did not [facility] Final Clinical Impression(s) / ED Diagnoses Final diagnoses:  Non-intractable vomiting with nausea, unspecified vomiting type  Other chronic pain    Rx / DC Orders ED Discharge Orders     None        Benjiman Core, MD 10/16/20 1935

## 2020-10-21 ENCOUNTER — Other Ambulatory Visit: Payer: Self-pay

## 2020-10-21 ENCOUNTER — Ambulatory Visit (HOSPITAL_COMMUNITY)
Admission: EM | Admit: 2020-10-21 | Discharge: 2020-10-21 | Disposition: A | Discharge status: Home / Self Care | Payer: Medicaid Other | Attending: Emergency Medicine | Admitting: Emergency Medicine

## 2020-10-21 ENCOUNTER — Encounter (HOSPITAL_COMMUNITY): Payer: Self-pay | Admitting: Emergency Medicine

## 2020-10-21 DIAGNOSIS — M5442 Lumbago with sciatica, left side: Secondary | ICD-10-CM | POA: Diagnosis not present

## 2020-10-21 DIAGNOSIS — M5410 Radiculopathy, site unspecified: Secondary | ICD-10-CM

## 2020-10-21 LAB — CULTURE, BLOOD (ROUTINE X 2): Culture: NO GROWTH

## 2020-10-21 MED ORDER — KETOROLAC TROMETHAMINE 60 MG/2ML IM SOLN
INTRAMUSCULAR | Status: AC
  Filled 2020-10-21: qty 2

## 2020-10-21 MED ORDER — METHYLPREDNISOLONE SODIUM SUCC 125 MG IJ SOLR
INTRAMUSCULAR | Status: AC
  Filled 2020-10-21: qty 2

## 2020-10-21 MED ORDER — METHYLPREDNISOLONE SODIUM SUCC 125 MG IJ SOLR
125.0000 mg | Freq: Once | INTRAMUSCULAR | Status: AC
  Administered 2020-10-21: 125 mg via INTRAMUSCULAR

## 2020-10-21 MED ORDER — KETOROLAC TROMETHAMINE 60 MG/2ML IM SOLN
60.0000 mg | Freq: Once | INTRAMUSCULAR | Status: AC
  Administered 2020-10-21: 60 mg via INTRAMUSCULAR

## 2020-10-21 NOTE — ED Provider Notes (Signed)
[facility]    CSN: [id number] Arrival date & time: 10/21/20  1358      History   Chief Complaint Chief Complaint  Patient presents with   Back Pain   Emesis   Migraine    HPI Tanya Krause is a 35 y.o. female.   Patient presents urgent care today complaining of increasing weakness and pain in her left leg due to known history of chronic regional pain syndrome and sciatica.  Patient is currently being followed by neurology and receives regularly scheduled injections in her lumbar spine for pain management, states her appointment was canceled this past week and has been rescheduled for November 7.  Patient is here today seeking pain relief.  Medication and allergy list was reviewed with patient.  The history is provided by the patient.   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 04/08/2019   Trochanteric bursitis of left hip 03/11/2019   S/P hysterectomy 02/06/2019   S/P TAH-BSO (total abdominal hysterectomy and bilateral salpingo-oophorectomy) 02/06/2019   Myofascial pain dysfunction syndrome 09/27/2018   Chronic bilateral back pain 09/03/2018   Colitis 08/12/2015   Oral thrush 11/18/2014   B12 deficiency 11/17/2014   Chronic pain syndrome 11/17/2014   Numbness 11/17/2014   Intractable headache 11/17/2014   Intractable chronic migraine without aura and  without status migrainosus 11/17/2014   Migraines 10/11/2014   Primary fibromyalgia syndrome 10/11/2014   Lower extremity weakness 10/11/2014   Vitamin B12 deficiency 10/11/2014   Anxiety state 10/11/2014   Nausea & vomiting 10/08/2014   Headache, tension type, chronic 09/07/2012   Abdominal pain 06/15/2012    Past Surgical History:  Procedure Laterality Date   HYSTERECTOMY ABDOMINAL WITH SALPINGO-OOPHORECTOMY Bilateral 02/06/2019   Procedure: HYSTERECTOMY ABDOMINAL WITH BILATERAL SALPINGO-OOPHORECTOMY;  Surgeon: Lazaro Arms, MD;  Location: [facility];  Service: Gynecology;  Laterality: Bilateral;   kidney infections     TUBAL LIGATION     WISDOM TOOTH EXTRACTION      OB History     Gravida  4   Para  4   Term  1   Preterm  3   AB      Living  4      SAB      IAB      Ectopic      Multiple      Live Births  4            Home Medications    Prior to Admission medications   Medication Sig Start Date End Date Taking? Authorizing Provider  acetaminophen (TYLENOL) 500 MG tablet Take 1,000 mg by mouth every 6 (six) hours as needed for moderate pain.    [provider]  albuterol (PROVENTIL) (2.5 MG/3ML) 0.083% nebulizer solution Take 3 mLs (2.5 mg total) by nebulization every 6 (six) hours as needed  for wheezing or shortness of breath. 06/20/20   Lorre Nick, MD  busPIRone (BUSPAR) 10 MG tablet Take 10 mg by mouth 3 (three) times daily. 09/23/20   [provider]  cyclobenzaprine (FLEXERIL) 10 MG tablet TAKE ONE TABLET BY MOUTH THREE TIMES A DAY. AS NEEDED FOR MUSCLE SPASMS Patient taking differently: Take 10 mg by mouth 3 (three) times daily as needed for muscle spasms. 09/24/20   Lovorn, Aundra Millet, MD  diclofenac (VOLTAREN) 50 MG EC tablet Take 1 tablet (50 mg total) by mouth 2 (two) times daily as needed (for swelling of hands/tendinitis). 03/20/20   Lovorn, Aundra Millet, MD  diphenhydrAMINE (BENADRYL) 25 MG tablet Take 25 mg by mouth every 6 (six) hours as  needed for itching or allergies.    [provider]  EMGALITY 120 MG/ML SOAJ Inject 1 each into the skin every 30 (thirty) days. No matter what- to decrease migraines- 04/24/20   Lovorn, Aundra Millet, MD  FLOVENT HFA 220 MCG/ACT inhaler Inhale 2 puffs into the lungs 2 (two) times daily. 12/03/19   [provider]  FLUoxetine (PROZAC) 40 MG capsule Take 80 mg by mouth every morning. 09/23/20   [provider]  folic acid (FOLVITE) 800 MCG tablet Take 400 mcg by mouth daily.    [provider]  hydrochlorothiazide (HYDRODIURIL) 25 MG tablet Take 25 mg by mouth daily. 10/15/20   [provider]  HYDROcodone-acetaminophen (NORCO/VICODIN) 5-325 MG tablet TAKE 1 TABLET BY MOUTH 3 (THREE) TIMES DAILY AS NEEDED. FOR PAIN 09/25/20   Lovorn, Aundra Millet, MD  hydrOXYzine (ATARAX/VISTARIL) 25 MG tablet Take 1 tablet (25 mg total) by mouth every 6 (six) hours as needed for anxiety. 08/27/20   Dartha Lodge, PA-C  lidocaine (LIDODERM) 5 % Place 3 patches onto the skin daily. Remove & Discard patch within 12 hours or as directed by MD Patient not taking: Reported on 10/16/2020 05/13/19   Lovorn, Aundra Millet, MD  nystatin (MYCOSTATIN) 100000 UNIT/ML suspension TAKE ONE TEASPOONFUL ( ) BY MOUTH FOUR TIMES DAILY Patient taking differently: Use as directed 5 mLs in the mouth or throat 4 (four) times daily as needed (thrush). 10/10/19   Lovorn, Aundra Millet, MD  pantoprazole (PROTONIX) 40 MG tablet TAKE ONE TABLET BY MOUTH ONCE DAILY FOR REFLUX OR HEARTBURN 09/25/20   Lovorn, Aundra Millet, MD  PROAIR HFA 108 [phone] Base) MCG/ACT inhaler Inhale 2-4 puffs into the lungs every 4 (four) hours as needed for wheezing or shortness of breath. 07/29/20   [provider]  promethazine (PHENERGAN) 25 MG tablet Take 25 mg by mouth every 8 (eight) hours as needed for nausea or vomiting.  11/27/19   [provider]  promethazine (PHENERGAN) 6.25 MG/5ML syrup TAKE TWO TEASPOONSFUL ( ) BY MOUTH FOUR TIMES DAILY AS  NEEDED FOR NAUSEA AND VOMITING. 02/18/20   Lovorn, Aundra Millet, MD  QUEtiapine (SEROQUEL) 50 MG tablet Take 50-100 mg by mouth at bedtime as needed (sleep). 08/11/20   [provider]  REXULTI 0.5 MG TABS Take 1 tablet by mouth every evening. 09/24/20   [provider]  SUMAtriptan (IMITREX) 100 MG tablet Take 1 tablet (100 mg total) by mouth every 2 (two) hours as needed for migraine. May repeat in 2 hours if headache persists or recurs. 06/01/20   Lovorn, Aundra Millet, MD  venlafaxine (EFFEXOR) 37.5 MG tablet Take 37.5 mg by mouth every morning. 02/20/20   [provider]    Family History Family History  Problem Relation Age of Onset   Hypertension Mother    Diabetes  Mother    Heart failure Father    Colon cancer Paternal Grandfather    Healthy Daughter    Healthy Son    Healthy Son    Healthy Son     Social History Social History   Tobacco Use   Smoking status: Some Days    Packs/day: 0.14    Years: 5.00    Pack years: 0.70    Types: Cigarettes   Smokeless tobacco: Never   Tobacco comments:    3 cig daily  Vaping Use   Vaping Use: Former  Substance Use Topics   Alcohol use: Not Currently    Comment: occ   Drug use: Not Currently    Types: Marijuana    Comment: last used september 2020     Allergies   Metoclopramide, Tomato, Aspirin, Doxycycline, Hydromorphone hcl, Ibuprofen, Ondansetron, Other, Shellfish allergy, Toradol [ketorolac tromethamine], Latex, and Tramadol   Review of Systems Review of Systems Pertinent findings noted in history of present illness.    Physical Exam Triage Vital Signs ED Triage Vitals  Enc Vitals Group     BP 10/21/20 1417 (!) 131/94     Pulse Rate 10/21/20 1417 (!) 102     Resp 10/21/20 1417 19     Temp 10/21/20 1417 98.2 F (36.8 C)     Temp Source 10/21/20 1417 Oral     SpO2 10/21/20 1417 97 %     Weight --      Height --      Head Circumference --      Peak Flow --      Pain Score 10/21/20 1416 10     Pain Loc  --      Pain Edu? --      Excl. in GC? --    No data found.  Updated Vital Signs BP (!) 131/94 (BP Location: Right Arm)   Pulse (!) 102   Temp 98.2 F (36.8 C) (Oral)   Resp 19   LMP 12/04/2018   SpO2 97%   Visual Acuity Right Eye Distance:   Left Eye Distance:   Bilateral Distance:    Right Eye Near:   Left Eye Near:    Bilateral Near:     Physical Exam Vitals and nursing note reviewed.  Constitutional:      Appearance: Normal appearance.  HENT:     Head: Normocephalic and atraumatic.     Left Ear: Tympanic membrane normal.  Cardiovascular:     Rate and Rhythm: Normal rate and regular rhythm.     Pulses: Normal pulses.     Heart sounds: Normal heart sounds.  Pulmonary:     Effort: Pulmonary effort is normal.     Breath sounds: Normal breath sounds.  Musculoskeletal:        General: Tenderness present. Normal range of motion.     Right lower leg: Edema present.     Left lower leg: Edema present.  Skin:    General: Skin is warm and dry.  Neurological:     General: No focal deficit present.     Mental Status: She is alert and oriented to person, place, and time. Mental status is at baseline.  Psychiatric:        Mood and Affect: Mood normal.        Behavior: Behavior normal.     UC Treatments / Results  Labs (all labs ordered are listed, but only abnormal results are displayed) Labs Reviewed - No data to display  EKG  Radiology No results found.  Procedures Procedures (including critical care time)  Medications Ordered in UC Medications  ketorolac (TORADOL) injection 60 mg (60 mg Intramuscular Given 10/21/20 1519)  methylPREDNISolone sodium succinate (SOLU-MEDROL) 125 mg/2 mL injection 125 mg (125 mg Intramuscular Given 10/21/20 1519)    Initial Impression / Assessment and Plan / UC Course  I have reviewed the triage vital signs and the nursing notes.  Pertinent labs & imaging results that were available during my care of the patient were  reviewed by me and considered in my medical decision making (see chart for details).    Patient was advised that I recommend that she receive an injection of ketorolac and Solu-Medrol for her pain, patient verbalized understanding and agreement with plan.  Per EMR, patient received an injection of ketorolac in December 2016 and tolerated well.    Patient verbalized understanding and agreement of plan as discussed.  All questions were addressed during visit.   Final Clinical Impressions(s) / UC Diagnoses   Final diagnoses:  Acute left-sided back pain with sciatica  Back pain with left-sided radiculopathy     Discharge Instructions      You received an injection of Solu-Medrol and ketorolac today, you received ketorolac in 2016 without incident.  Please follow-up with your primary provider for further management of your pain.     ED Prescriptions   None    PDMP not reviewed this encounter.   Theadora Rama Scales, PA-C 10/21/20 1601

## 2020-10-21 NOTE — Discharge Instructions (Addendum)
You received an injection of Solu-Medrol and ketorolac today, you received ketorolac in 2016 without incident.  Please follow-up with your primary provider for further management of your pain.

## 2020-10-21 NOTE — ED Notes (Signed)
Pt hx scoliosis and multiple other diagnosis. Reports gets trigger point injections monthly but hasnt been able to get in with her doctor. Reports back pains left leg pains and swelling "feels like salt running through my leg". Reports pain so bad been vomiting and having migraines. Cant get in with provider for injections until November

## 2020-10-22 ENCOUNTER — Other Ambulatory Visit: Payer: Self-pay | Admitting: Physical Medicine and Rehabilitation

## 2020-10-22 ENCOUNTER — Telehealth: Payer: Self-pay | Admitting: *Deleted

## 2020-10-22 MED ORDER — HYDROCODONE-ACETAMINOPHEN 5-325 MG PO TABS: 1.0000 | ORAL_TABLET | Freq: Three times a day (TID) | ORAL | 0 refills | Status: DC | PRN

## 2020-10-22 MED ORDER — LIDOCAINE 5 % EX OINT: 1.0000 "application " | TOPICAL_OINTMENT | Freq: Four times a day (QID) | CUTANEOUS | 5 refills | Status: DC | PRN

## 2020-10-22 NOTE — Telephone Encounter (Signed)
Ardena called requesting a refill on her hydrocodone and muscle relaxer and asks that it be sent to [facility].  Her last refill on hydrocodone was 09/25/20 and her next appt is 11/20/20.

## 2020-10-22 NOTE — Telephone Encounter (Signed)
I let her know about the flexeril refills and her hydrocodone.  She says her sciatica is giving her a fit and was at Urgent Care yesterday. She is requesting an order for lidocaine ointment be sent in instead of the patches, as they will not stick and stay in place.

## 2020-11-04 ENCOUNTER — Other Ambulatory Visit: Payer: Self-pay | Admitting: Physical Medicine and Rehabilitation

## 2020-11-04 ENCOUNTER — Telehealth: Payer: Self-pay

## 2020-11-04 NOTE — Telephone Encounter (Signed)
Patient is calling to request a refill on the Promethizine syrup. She states she is not able to keep anything down. Please advise

## 2020-11-04 NOTE — Telephone Encounter (Signed)
Refill sent to Dr Berline Chough.

## 2020-11-16 ENCOUNTER — Emergency Department (HOSPITAL_COMMUNITY)
Admission: EM | Admit: 2020-11-16 | Discharge: 2020-11-16 | Disposition: A | Discharge status: Home / Self Care | Payer: Medicaid Other | Attending: Emergency Medicine | Admitting: Emergency Medicine

## 2020-11-16 ENCOUNTER — Telehealth: Payer: Self-pay | Admitting: Physical Medicine and Rehabilitation

## 2020-11-16 ENCOUNTER — Encounter (HOSPITAL_COMMUNITY): Payer: Self-pay | Admitting: Emergency Medicine

## 2020-11-16 ENCOUNTER — Emergency Department (HOSPITAL_COMMUNITY): Payer: Medicaid Other

## 2020-11-16 DIAGNOSIS — R1110 Vomiting, unspecified: Secondary | ICD-10-CM | POA: Insufficient documentation

## 2020-11-16 DIAGNOSIS — F17210 Nicotine dependence, cigarettes, uncomplicated: Secondary | ICD-10-CM | POA: Insufficient documentation

## 2020-11-16 DIAGNOSIS — Z20822 Contact with and (suspected) exposure to covid-19: Secondary | ICD-10-CM | POA: Diagnosis not present

## 2020-11-16 DIAGNOSIS — I10 Essential (primary) hypertension: Secondary | ICD-10-CM | POA: Diagnosis not present

## 2020-11-16 DIAGNOSIS — M791 Myalgia, unspecified site: Secondary | ICD-10-CM | POA: Insufficient documentation

## 2020-11-16 DIAGNOSIS — Z7951 Long term (current) use of inhaled steroids: Secondary | ICD-10-CM | POA: Diagnosis not present

## 2020-11-16 DIAGNOSIS — R109 Unspecified abdominal pain: Secondary | ICD-10-CM | POA: Insufficient documentation

## 2020-11-16 DIAGNOSIS — M5432 Sciatica, left side: Secondary | ICD-10-CM | POA: Diagnosis not present

## 2020-11-16 DIAGNOSIS — Z79899 Other long term (current) drug therapy: Secondary | ICD-10-CM | POA: Insufficient documentation

## 2020-11-16 DIAGNOSIS — Z91040 Latex allergy status: Secondary | ICD-10-CM | POA: Insufficient documentation

## 2020-11-16 DIAGNOSIS — N9489 Other specified conditions associated with female genital organs and menstrual cycle: Secondary | ICD-10-CM | POA: Insufficient documentation

## 2020-11-16 DIAGNOSIS — J45909 Unspecified asthma, uncomplicated: Secondary | ICD-10-CM | POA: Insufficient documentation

## 2020-11-16 DIAGNOSIS — R0789 Other chest pain: Secondary | ICD-10-CM | POA: Insufficient documentation

## 2020-11-16 DIAGNOSIS — R079 Chest pain, unspecified: Secondary | ICD-10-CM | POA: Diagnosis present

## 2020-11-16 DIAGNOSIS — R519 Headache, unspecified: Secondary | ICD-10-CM | POA: Diagnosis not present

## 2020-11-16 LAB — CBC WITH DIFFERENTIAL/PLATELET
Abs Immature Granulocytes: 0.05 10*3/uL (ref 0.00–0.07)
Basophils Absolute: 0.1 10*3/uL (ref 0.0–0.1)
Basophils Relative: 1 %
Eosinophils Absolute: 0.3 10*3/uL (ref 0.0–0.5)
Eosinophils Relative: 3 %
HCT: 36.9 % (ref 36.0–46.0)
Hemoglobin: 12.7 g/dL (ref 12.0–15.0)
Immature Granulocytes: 1 %
Lymphocytes Relative: 42 %
Lymphs Abs: 4.2 10*3/uL — ABNORMAL HIGH (ref 0.7–4.0)
MCH: 28.5 pg (ref 26.0–34.0)
MCHC: 34.4 g/dL (ref 30.0–36.0)
MCV: 82.7 fL (ref 80.0–100.0)
Monocytes Absolute: 0.7 10*3/uL (ref 0.1–1.0)
Monocytes Relative: 7 %
Neutro Abs: 4.8 10*3/uL (ref 1.7–7.7)
Neutrophils Relative %: 46 %
Platelets: 304 10*3/uL (ref 150–400)
RBC: 4.46 MIL/uL (ref 3.87–5.11)
RDW: 14.1 % (ref 11.5–15.5)
WBC: 10.1 10*3/uL (ref 4.0–10.5)
nRBC: 0 % (ref 0.0–0.2)

## 2020-11-16 LAB — COMPREHENSIVE METABOLIC PANEL
ALT: 13 U/L (ref 0–44)
AST: 18 U/L (ref 15–41)
Albumin: 4 g/dL (ref 3.5–5.0)
Alkaline Phosphatase: 57 U/L (ref 38–126)
Anion gap: 8 (ref 5–15)
BUN: 12 mg/dL (ref 6–20)
CO2: 24 mmol/L (ref 22–32)
Calcium: 9.3 mg/dL (ref 8.9–10.3)
Chloride: 104 mmol/L (ref 98–111)
Creatinine, Ser: 0.72 mg/dL (ref 0.44–1.00)
GFR, Estimated: >60 mL/min (ref >60)
Glucose, Bld: 99 mg/dL (ref 70–99)
Potassium: 4 mmol/L (ref 3.5–5.1)
Sodium: 136 mmol/L (ref 135–145)
Total Bilirubin: 0.4 mg/dL (ref 0.3–1.2)
Total Protein: 7.6 g/dL (ref 6.5–8.1)

## 2020-11-16 LAB — I-STAT BETA HCG BLOOD, ED (MC, WL, AP ONLY): I-stat hCG, quantitative: <5 m[IU]/mL (ref <5)

## 2020-11-16 LAB — LIPASE, BLOOD: Lipase: 44 U/L (ref 11–51)

## 2020-11-16 LAB — TROPONIN I (HIGH SENSITIVITY): Troponin I (High Sensitivity): <2 ng/L (ref <18)

## 2020-11-16 LAB — RESP PANEL BY RT-PCR (FLU A&B, COVID) ARPGX2
Influenza A by PCR: NEGATIVE
Influenza B by PCR: NEGATIVE
SARS Coronavirus 2 by RT PCR: NEGATIVE

## 2020-11-16 MED ORDER — SODIUM CHLORIDE 0.9 % IV SOLN
12.5000 mg | Freq: Four times a day (QID) | INTRAVENOUS | Status: DC | PRN
  Administered 2020-11-16: 12.5 mg via INTRAVENOUS
  Filled 2020-11-16: qty 12.5

## 2020-11-16 MED ORDER — SODIUM CHLORIDE 0.9 % IV BOLUS
1000.0000 mL | Freq: Once | INTRAVENOUS | Status: AC
  Administered 2020-11-16: 1000 mL via INTRAVENOUS

## 2020-11-16 MED ORDER — LIDOCAINE 5 % EX PTCH: 1.0000 | MEDICATED_PATCH | CUTANEOUS | 0 refills | Status: DC

## 2020-11-16 MED ORDER — METHOCARBAMOL 1000 MG/10ML IJ SOLN
500.0000 mg | Freq: Once | INTRAVENOUS | Status: AC
  Administered 2020-11-16: 500 mg via INTRAVENOUS
  Filled 2020-11-16: qty 500

## 2020-11-16 MED ORDER — DEXAMETHASONE SODIUM PHOSPHATE 10 MG/ML IJ SOLN
10.0000 mg | Freq: Once | INTRAMUSCULAR | Status: AC
  Administered 2020-11-16: 10 mg via INTRAVENOUS
  Filled 2020-11-16: qty 1

## 2020-11-16 MED ORDER — METHOCARBAMOL 1000 MG/10ML IJ SOLN
500.0000 mg | Freq: Once | INTRAMUSCULAR | Status: DC
  Filled 2020-11-16: qty 5

## 2020-11-16 NOTE — ED Triage Notes (Signed)
Per pt, states she is having a flare up of "everything"-feels like "everything is pushing together and its getting worser"

## 2020-11-16 NOTE — ED Notes (Signed)
Fluid Challenge DOne. Tolerated well.

## 2020-11-16 NOTE — Telephone Encounter (Signed)
Patient called states she is having trouble sitting up and pain in legs medication not working would like some advise on what to do, and if she should go to urgent care, has an appt this Friday for trigger points.

## 2020-11-16 NOTE — ED Provider Notes (Addendum)
Emergency Medicine Provider Triage Evaluation Note  Tanya Krause , a 35 y.o. female  was evaluated in triage.  Pt complains of pain all over.  Patient has tried taking hydrocodone and Phenergan, no relief.  Tried calling her PCP who is out of office today.  History of chronic pain syndrome and fibromyalgia.  Review of Systems  Positive: Abdominal pain, back pain, sciatic pain, upper extremity pain, nausea, vomiting Negative:   Physical Exam  Pulse (!) 107   Temp 97.9 F (36.6 C) (Oral)   Resp 18   LMP 12/04/2018   SpO2 100%  Gen:   Awake, no distress   Resp:  Normal effort  MSK:   Moves extremities without difficulty  Other:  Abdomen is soft, diffusely tender.  Patient is mildly tachycardic  Medical Decision Making  Medically screening exam initiated at 1:44 PM.  Appropriate orders placed.  Tanya Krause was informed that the remainder of the evaluation will be completed by another provider, this initial triage assessment does not replace that evaluation, and the importance of remaining in the ED until their evaluation is complete.  Abdominal labs, COVID test given body aches.  Patient is tachycardic, negative for oral birth control, recent travel, surgery.  Suspect secondary to pain.   Theron Arista, PA-C 11/16/20 1346    Theron Arista, PA-C 11/16/20 1347    Derwood Kaplan, MD 11/17/20 2031

## 2020-11-16 NOTE — ED Notes (Signed)
Pt's is unable to provide a urine sample at this time.

## 2020-11-16 NOTE — Discharge Instructions (Addendum)
Use the lidocaine patch as needed to help with symptoms. Follow-up with your primary care provider. Continue your home medications as previously prescribed. Return to the ER if you start to experience worsening symptoms, injuries or falls, chest pain, numbness in arms or legs

## 2020-11-16 NOTE — ED Notes (Signed)
Pt said they usually use the ultrasound machine to get her blood.

## 2020-11-16 NOTE — ED Provider Notes (Signed)
[facility]-EMERGENCY DEPT Provider Note   CSN: [id number] Arrival date & time: 11/16/20  1242     History Chief Complaint  Patient presents with   Generalized Body Aches   Abdominal Pain    Tanya Krause is a 35 y.o. female with a past medical history of fibromyalgia, chronic pain syndrome, hypertension, asthma presenting to the ED with a chief complaint of generalized body aches, headache and chest pain.  Her symptoms began a few days ago.  Minimal improvement noted with her home hydrocodone and Phenergan.  Reports pain is in her chest as well as her lower back.  Reports a flareup of her sciatica as well and a "flare of scoliosis."  She feels the symptoms are causing her to have headache.  She denies any injury or fall.  Reports several episodes of nonbloody, nonbilious emesis despite her Phenergan.  Denies any cough, fever, abdominal pain, numbness in arms or legs, blurry vision.   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 04/08/2019   Trochanteric bursitis of left hip 03/11/2019   S/P hysterectomy 02/06/2019   S/P TAH-BSO (total  abdominal hysterectomy and bilateral salpingo-oophorectomy) 02/06/2019   Myofascial pain dysfunction syndrome 09/27/2018   Chronic bilateral back pain 09/03/2018   Colitis 08/12/2015   Oral thrush 11/18/2014   B12 deficiency 11/17/2014   Chronic pain syndrome 11/17/2014   Numbness 11/17/2014   Intractable headache 11/17/2014   Intractable chronic migraine without aura and without status migrainosus 11/17/2014   Migraines 10/11/2014   Primary fibromyalgia syndrome 10/11/2014   Lower extremity weakness 10/11/2014   Vitamin B12 deficiency 10/11/2014   Anxiety state 10/11/2014   Nausea & vomiting 10/08/2014   Headache, tension type, chronic 09/07/2012   Abdominal pain 06/15/2012    Past Surgical History:  Procedure Laterality Date   HYSTERECTOMY ABDOMINAL WITH SALPINGO-OOPHORECTOMY Bilateral 02/06/2019   Procedure: HYSTERECTOMY ABDOMINAL WITH BILATERAL SALPINGO-OOPHORECTOMY;  Surgeon: Lazaro Arms, MD;  Location: [facility];  Service: Gynecology;  Laterality: Bilateral;   kidney infections     TUBAL LIGATION     WISDOM TOOTH EXTRACTION       OB History     Gravida  4   Para  4   Term  1   Preterm  3   AB      Living  4      SAB      IAB      Ectopic      Multiple      Live Births  4           Family History  Problem Relation Age of Onset   Hypertension Mother    Diabetes Mother    Heart failure Father    Colon cancer Paternal Grandfather    Healthy Daughter    Healthy Son    Healthy Son    Healthy Son     Social History   Tobacco Use   Smoking status: Some Days    Packs/day: 0.14    Years: 5.00    Pack years: 0.70    Types: Cigarettes   Smokeless tobacco: Never   Tobacco comments:    3 cig daily  Vaping Use   Vaping Use: Former  Substance Use Topics   Alcohol use: Not Currently    Comment: occ   Drug use: Not Currently    Types: Marijuana    Comment: last used september 2020    Home Medications Prior to Admission medications    Medication Sig Start Date End Date Taking? Authorizing Provider  lidocaine (LIDODERM) 5 % Place 1 patch onto the skin daily. Remove & Discard patch within 12 hours or as directed by MD 11/16/20  Yes Adrick Kestler, PA-C  acetaminophen (TYLENOL) 500 MG tablet Take 1,000 mg by mouth every 6 (six) hours as needed for moderate pain.    [provider]  albuterol (PROVENTIL) (2.5 MG/3ML) 0.083% nebulizer solution Take 3 mLs (2.5 mg total) by nebulization every 6 (six) hours as needed for wheezing or shortness of breath. 06/20/20   Lacretia Leigh, MD  busPIRone (BUSPAR) 10 MG tablet Take 10 mg by mouth 3 (three) times daily. 09/23/20   [provider]  cyclobenzaprine (FLEXERIL) 10 MG tablet TAKE ONE TABLET BY MOUTH THREE TIMES A DAY. AS NEEDED FOR MUSCLE SPASMS Patient taking differently: Take 10 mg by mouth 3 (three) times daily as needed for muscle spasms. 09/24/20   Lovorn, Jinny Blossom, MD  diclofenac (VOLTAREN) 50 MG EC tablet Take 1 tablet (50 mg total) by mouth 2 (two) times daily as needed (for swelling of hands/tendinitis). 03/20/20   Lovorn, Jinny Blossom, MD  diphenhydrAMINE (BENADRYL) 25 MG tablet Take 25 mg by mouth every 6 (six) hours as needed for itching or allergies.    [provider]  EMGALITY 120 MG/ML SOAJ Inject 1 each into the skin every 30 (thirty) days. No matter what- to decrease migraines- 04/24/20   Lovorn, Jinny Blossom, MD  FLOVENT HFA 220 MCG/ACT inhaler Inhale 2 puffs into the lungs 2 (two) times daily. 12/03/19   [provider]  FLUoxetine (PROZAC) 40 MG capsule Take 80 mg by mouth every morning. 09/23/20   [provider]  folic acid (FOLVITE) Q000111Q MCG tablet Take 400 mcg by mouth daily.    [provider]  hydrochlorothiazide (HYDRODIURIL) 25 MG tablet Take 25 mg by mouth daily. 10/15/20   [provider]  HYDROcodone-acetaminophen (NORCO/VICODIN) 5-325 MG tablet Take 1 tablet by mouth 3 (three) times daily as needed. for pain 10/22/20   Lovorn,  Jinny Blossom, MD  hydrOXYzine (ATARAX/VISTARIL) 25 MG tablet Take 1 tablet (25 mg total) by mouth every 6 (six) hours as needed for anxiety. 08/27/20   Jacqlyn Larsen, PA-C  nystatin (MYCOSTATIN) 100000 UNIT/ML suspension TAKE ONE TEASPOONFUL (5ML) BY MOUTH FOUR TIMES DAILY Patient taking differently: Use as directed 5 mLs in the mouth or throat 4 (four) times daily as needed (thrush). 10/10/19   Lovorn, Jinny Blossom, MD  pantoprazole (PROTONIX) 40 MG tablet TAKE ONE TABLET BY MOUTH ONCE DAILY FOR REFLUX OR HEARTBURN 09/25/20   Lovorn, Jinny Blossom, MD  [facility]  HFA 108 (90 Base) MCG/ACT inhaler Inhale 2-4 puffs into the lungs every 4 (four) hours as needed for wheezing or shortness of breath. 07/29/20   [provider]  promethazine (PHENERGAN) 25 MG tablet Take 25 mg by mouth every 8 (eight) hours as needed for nausea or vomiting.  11/27/19   [provider]  promethazine (PHENERGAN) 6.25 MG/5ML syrup TAKE 10 ML BY MOUTH FOUR TIMES A DAY AS NEEDED FOR NAUSEA OR VOMITING 11/05/20   Lovorn, Jinny Blossom, MD  QUEtiapine (SEROQUEL) 50 MG tablet Take 50-100 mg by mouth at bedtime as needed (sleep). 08/11/20   [provider]  REXULTI 0.5 MG TABS Take 1 tablet by mouth every evening. 09/24/20   [provider]  SUMAtriptan (IMITREX) 100 MG tablet TAKE 1 TABLET (100 MG TOTAL) BY MOUTH EVERY 2 (TWO) HOURS AS NEEDED FOR MIGRAINE MAY REPEAT IN 2 HOURS IF HEADACHE PERSISTS OR RECURS. 10/23/20   Lovorn, Jinny Blossom, MD  venlafaxine (EFFEXOR) 37.5 MG tablet Take 37.5 mg by mouth every morning. 02/20/20   [provider]    Allergies    Metoclopramide, Tomato, Aspirin, Doxycycline, Hydromorphone hcl, Ibuprofen, Ondansetron, Other, Shellfish allergy, Toradol [ketorolac tromethamine], Latex, and Tramadol  Review of Systems   Review of Systems  Constitutional:  Negative for appetite change, chills and fever.  HENT:  Negative for ear pain, rhinorrhea, sneezing and sore throat.   Eyes:  Negative for photophobia  and visual disturbance.  Respiratory:  Negative for cough, chest tightness, shortness of breath and wheezing.   Cardiovascular:  Positive for chest pain. Negative for palpitations.  Gastrointestinal:  Positive for abdominal pain and vomiting. Negative for blood in stool, constipation, diarrhea and nausea.  Genitourinary:  Negative for dysuria, hematuria and urgency.  Musculoskeletal:  Positive for myalgias.  Skin:  Negative for rash.  Neurological:  Positive for headaches. Negative for dizziness, weakness and light-headedness.   Physical Exam Updated Vital Signs BP 128/85   Pulse 88   Temp 97.9 F (36.6 C) (Oral)   Resp 17   LMP 12/04/2018   SpO2 100%   Physical Exam Vitals and nursing note reviewed.  Constitutional:      General: She is not in acute distress.    Appearance: She is well-developed.  HENT:     Head: Normocephalic and atraumatic.     Nose: Nose normal.  Eyes:     General: No scleral icterus.       Left eye: No discharge.     Conjunctiva/sclera: Conjunctivae normal.  Cardiovascular:     Rate and Rhythm: Normal rate and regular rhythm.     Heart sounds: Normal heart sounds. No murmur heard.   No friction rub. No gallop.  Pulmonary:     Effort: Pulmonary effort is normal. No respiratory distress.     Breath sounds: Normal breath sounds.  Abdominal:     General: Bowel sounds are normal. There is no distension.     Palpations: Abdomen is soft.     Tenderness: There is no abdominal tenderness. There is no guarding.  Musculoskeletal:        General: Normal range of motion.     Cervical back: Normal range of motion and neck supple.  Skin:    General: Skin is warm and dry.     Findings: No rash.  Neurological:     Mental Status: She is alert.     Motor: No abnormal muscle tone.     Coordination: Coordination normal.    ED Results /  Procedures / Treatments   Labs (all labs ordered are listed, but only abnormal results are displayed) Labs Reviewed  CBC  WITH DIFFERENTIAL/PLATELET - Abnormal; Notable for the following components:      Result Value   Lymphs Abs 4.2 (*)    All other components within normal limits  RESP PANEL BY RT-PCR (FLU A&B, COVID) ARPGX2  COMPREHENSIVE METABOLIC PANEL  LIPASE, BLOOD  I-STAT BETA HCG BLOOD, ED ([facility], [facility], [facility] ONLY)  TROPONIN I (HIGH SENSITIVITY)    EKG None  Radiology DG Chest 2 View  Result Date: 11/16/2020 CLINICAL DATA:  Chest pain. EXAM: CHEST - 2 VIEW COMPARISON:  Chest x-ray 10/16/2020. FINDINGS: The heart size and mediastinal contours are within normal limits. Both lungs are clear. The visualized skeletal structures are unremarkable. IMPRESSION: No active cardiopulmonary disease. Electronically Signed   By: Ronney Asters M.D.   On: 11/16/2020 19:54    Procedures Procedures   Medications Ordered in ED Medications  promethazine (PHENERGAN) 12.5 mg in sodium chloride 0.9 % 50 mL IVPB (0 mg Intravenous Stopped 11/16/20 2211)  sodium chloride 0.9 % bolus 1,000 mL (0 mLs Intravenous Stopped 11/16/20 2225)  dexamethasone (DECADRON) injection 10 mg (10 mg Intravenous Given 11/16/20 2139)  methocarbamol (ROBAXIN) 500 mg in dextrose 5 % 50 mL IVPB (0 mg Intravenous Stopped 11/16/20 2211)    ED Course  I have reviewed the triage vital signs and the nursing notes.  Pertinent labs & imaging results that were available during my care of the patient were reviewed by me and considered in my medical decision making (see chart for details).  Clinical Course as of 11/16/20 2231  Mon Nov 16, 2020  2101 Lipase: 44 [HK]  2101 Troponin I (High Sensitivity): <2 [HK]    Clinical Course User Index [HK] Delia Heady, PA-C   MDM Rules/Calculators/A&P                           35 year old female presenting to the ED for generalized body aches, flareup of her sciatica and scoliosis, headache, chest pain, emesis.  Symptoms have been going on for the past few days and unable to be improved with her home meds.   On exam patient without any signs of distress.  Her lungs are clear to auscultation bilaterally.  Her abdomen is soft.  Her tachycardia has improved with rest upon arrival to the ER room.  Will obtain lab work, chest x-ray, EKG and reassess.  EKG shows no ischemic changes.  CBC, CMP, lipase unremarkable.  Troponin is negative x1.  hCG is negative.  Chest x-ray without any acute findings.  Patient was treated symptomatically here with IV fluids, Decadron, Robaxin and Phenergan with improvement in her symptoms.  She is ambulatory here.  She is able to tolerate p.o. intake without difficulty.  This is back to her symptoms could be viral or flareup of her chronic pain.  At this time there appears to be no emergent condition such as cauda equina as a cause of her back pain, ACS as a cause of her chest pain and she is PERC negative.  Will treat symptomatically at home and continue her home medications.  She is requesting discharge.  Return precautions given.   Patient is hemodynamically stable, in NAD, and able to ambulate in the ED. Evaluation does not show pathology that would require ongoing emergent intervention or inpatient treatment. I explained the diagnosis to the patient. Pain has been managed and  has no complaints prior to discharge. Patient is comfortable with above plan and is stable for discharge at this time. All questions were answered prior to disposition. Strict return precautions for returning to the ED were discussed. Encouraged follow up with PCP.   An After Visit Summary was printed and given to the patient.   Portions of this note were generated with Lobbyist. Dictation errors may occur despite best attempts at proofreading.  Final Clinical Impression(s) / ED Diagnoses Final diagnoses:  Sciatica of left side  Chest wall pain    Rx / DC Orders ED Discharge Orders          Ordered    lidocaine (LIDODERM) 5 %  Every 24 hours        11/16/20 2229              Delia Heady, PA-C 11/16/20 [place], [place], DO 11/16/20 2325

## 2020-11-17 ENCOUNTER — Other Ambulatory Visit: Payer: Self-pay | Admitting: Physical Medicine and Rehabilitation

## 2020-11-18 ENCOUNTER — Encounter: Payer: Self-pay | Admitting: Physical Medicine and Rehabilitation

## 2020-11-18 ENCOUNTER — Other Ambulatory Visit: Payer: Self-pay

## 2020-11-18 ENCOUNTER — Encounter
Payer: Medicaid Other | Attending: Physical Medicine and Rehabilitation | Admitting: Physical Medicine and Rehabilitation

## 2020-11-18 VITALS — BP 124/85 | HR 99 | Ht 60.0 in | Wt 175.0 lb

## 2020-11-18 DIAGNOSIS — Z5181 Encounter for therapeutic drug level monitoring: Secondary | ICD-10-CM | POA: Insufficient documentation

## 2020-11-18 DIAGNOSIS — Z79891 Long term (current) use of opiate analgesic: Secondary | ICD-10-CM | POA: Insufficient documentation

## 2020-11-18 DIAGNOSIS — G894 Chronic pain syndrome: Secondary | ICD-10-CM | POA: Insufficient documentation

## 2020-11-18 DIAGNOSIS — M7918 Myalgia, other site: Secondary | ICD-10-CM | POA: Diagnosis present

## 2020-11-18 DIAGNOSIS — G43519 Persistent migraine aura without cerebral infarction, intractable, without status migrainosus: Secondary | ICD-10-CM | POA: Insufficient documentation

## 2020-11-18 DIAGNOSIS — E871 Hypo-osmolality and hyponatremia: Secondary | ICD-10-CM | POA: Diagnosis present

## 2020-11-18 MED ORDER — HYDROCODONE-ACETAMINOPHEN 5-325 MG PO TABS: 1.0000 | ORAL_TABLET | Freq: Four times a day (QID) | ORAL | 0 refills | Status: DC | PRN

## 2020-11-18 MED ORDER — OXCARBAZEPINE 300 MG PO TABS: 300.0000 mg | ORAL_TABLET | Freq: Every day | ORAL | 5 refills | Status: DC

## 2020-11-18 MED ORDER — NAPROXEN 500 MG PO TABS: 500.0000 mg | ORAL_TABLET | Freq: Two times a day (BID) | ORAL | 5 refills | Status: DC

## 2020-11-18 NOTE — Patient Instructions (Signed)
1.Trileptal /oxcarbemazepine 300 mg nightly x 1 week, then 600 mg QHS a x 1 week, then 900 mg nightly-  For nerve pain/sciatica.    2. Will need to get a lab- BMP in 66month- at any [facility].  NEEDS TO GET DONE since sodium can go low with this medicine.    3. Refill Noroc- is due 5/325 mg 3x/day as needed- #90.   4. Patient here for trigger point injections for  Consent done and on chart.  Cleaned areas with alcohol and injected using a 27 gauge 1.5 inch needle  Injected  7cc Using 1% Lidocaine with no EPI  Upper traps- B/L  Levators B/L  Posterior scalenes B/L  Middle scalenes Splenius Capitus- B/L  Pectoralis Major- B/L  Rhomboids B/L x2 Infraspinatus Teres Major/minor Thoracic paraspinals B/L  Lumbar paraspinals B/L x2 Other injections- /L dorsal interosseus   Patient's level of pain prior was 10/10 Current level of pain after injections is 8-9/10  There was no bleeding or complications.  Patient was advised to drink a lot of water on day after injections to flush system Will have increased soreness for 12-48 hours after injections.  Can use Lidocaine patches the day AFTER injections Can use theracane on day of injections in places didn't inject Can use heating pad 4-6 hours AFTER injections   5. Asking for different muscle relaxant- but literally have tried all of them-    6. F/U in 6-8 weeks- for trigger point injections.

## 2020-11-18 NOTE — Progress Notes (Signed)
Subjective:    Patient ID: Tanya Krause, female    DOB: 14-Apr-1985, 35 y.o.   MRN: [id number]  HPI  Patient is is 35 yr old female with hx of chronic pain syndrome- with myofascial pain syndrome, fibromyalgia - Also has sickle cell disease. And chronic migraines- intractable.     Pt is here for f/u on FMS/myofascial pain and trigger point injections.    Missed a few appointments- really feels bad since missed appointments for trigger point injections.    Got toradol shot in butt 2 weeks ago- moved a little better.  But has worn off.   Tried- Duloxetine- didn't work Gabapentin- didn't work Tried Keppra- didn't work Tried Diclofenac in March- doesn't remember-  Not taking it.   Pain Inventory Average Pain 8 Pain Right Now 10 My pain is constant, sharp, burning, dull, stabbing, and tingling  In the last 24 hours, has pain interfered with the following? General activity 1 Relation with others 1 Enjoyment of life 2 What TIME of day is your pain at its worst? morning , daytime, evening, and night Sleep (in general) Good  Pain is worse with: walking, bending, sitting, inactivity, standing, and some activites Pain improves with: medication Relief from Meds: 1  Family History  Problem Relation Age of Onset   Hypertension Mother    Diabetes Mother    Heart failure Father    Colon cancer Paternal Grandfather    Healthy Daughter    Healthy Son    Healthy Son    Healthy Son    Social History   Socioeconomic History   Marital status: Single    Spouse name: Not on file   Number of children: 4   Years of education: 12   Highest education level: Not on file  Occupational History    Comment: unemployed  Tobacco Use   Smoking status: Some Days    Packs/day: 0.14    Years: 5.00    Pack years: 0.70    Types: Cigarettes   Smokeless tobacco: Never   Tobacco comments:    3 cig daily  Vaping Use   Vaping Use: Former   Substances: CBD  Substance and Sexual Activity    Alcohol use: Not Currently    Comment: occ   Drug use: Not Currently    Types: Marijuana    Comment: last used september 2020   Sexual activity: Yes    Birth control/protection: Surgical  Other Topics Concern   Not on file  Social History Narrative   Lives with parents, children   Caffeine use- coffee once a day, sodas x 2 a day   Social Determinants of Health   Financial Resource Strain: Not on file  Food Insecurity: Not on file  Transportation Needs: Not on file  Physical Activity: Not on file  Stress: Not on file  Social Connections: Not on file   Past Surgical History:  Procedure Laterality Date   HYSTERECTOMY ABDOMINAL WITH SALPINGO-OOPHORECTOMY Bilateral 02/06/2019   Procedure: HYSTERECTOMY ABDOMINAL WITH BILATERAL SALPINGO-OOPHORECTOMY;  Surgeon: Lazaro Arms, MD;  Location: [facility];  Service: Gynecology;  Laterality: Bilateral;   kidney infections     TUBAL LIGATION     WISDOM TOOTH EXTRACTION     Past Surgical History:  Procedure Laterality Date   HYSTERECTOMY ABDOMINAL WITH SALPINGO-OOPHORECTOMY Bilateral 02/06/2019   Procedure: HYSTERECTOMY ABDOMINAL WITH BILATERAL SALPINGO-OOPHORECTOMY;  Surgeon: Lazaro Arms, MD;  Location: [facility];  Service: Gynecology;  Laterality: Bilateral;   kidney infections  TUBAL LIGATION     WISDOM TOOTH EXTRACTION     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   Hypertension    Migraine    Pelvic inflammatory disease (PID)    BP 124/85   Pulse 99   Ht 5' (1.524 m)   Wt 175 lb (79.4 kg)   LMP 12/04/2018   SpO2 98%   BMI 34.18 kg/m   Opioid Risk Score:   Fall Risk Score:  `1  Depression screen PHQ 2/9  Depression screen [facility] 2/9 03/20/2020 09/30/2019 09/30/2019 07/17/2019 06/19/2019 12/25/2018 10/25/2018  Decreased Interest 1 3 3 2 3  0 1  Down, Depressed, Hopeless 1 3 3 2 3  0 1  PHQ - 2  Score 2 6 6 4 6  0 2  Altered sleeping - 3 - - - - -  Tired, decreased energy - 3 - - - - -  Change in appetite - 3 - - - - -  Feeling bad or failure about yourself  - 1 - - - - -  Trouble concentrating - 3 - - - - -  Moving slowly or fidgety/restless - 1 - - - - -  Suicidal thoughts - 0 - - - - -  PHQ-9 Score - 20 - - - - -  Some recent data might be hidden    Review of Systems  Musculoskeletal:  Positive for arthralgias, back pain and gait problem.  All other systems reviewed and are negative.     Objective:   Physical Exam  Awake, alert, depressed; using rollator; NAD Trigger points documented where injected- all over neck/back/hands/low back-       Assessment & Plan:   1.Trileptal /oxcarbemazepine 300 mg nightly x 1 week, then 600 mg QHS a x 1 week, then 900 mg nightly-  For nerve pain/sciatica.    2. Will need to get a lab- BMP in 29month- at any [facility].  NEEDS TO GET DONE since sodium can go low with this medicine.    3. Refill Noroc- is due 5/325 mg 3x/day as needed- #90.   4. Patient here for trigger point injections for  Consent done and on chart.  Cleaned areas with alcohol and injected using a 27 gauge 1.5 inch needle  Injected  7cc Using 1% Lidocaine with no EPI  Upper traps- B/L  Levators B/L  Posterior scalenes B/L  Middle scalenes Splenius Capitus- B/L  Pectoralis Major- B/L  Rhomboids B/L x2 Infraspinatus Teres Major/minor Thoracic paraspinals B/L  Lumbar paraspinals B/L x2 Other injections- /L dorsal interosseus   Patient's level of pain prior was 10/10 Current level of pain after injections is 8-9/10  There was no bleeding or complications.  Patient was advised to drink a lot of water on day after injections to flush system Will have increased soreness for 12-48 hours after injections.  Can use Lidocaine patches the day AFTER injections Can use theracane on day of injections in places didn't inject Can use heating pad 4-6 hours  AFTER injections   5. Asking for different muscle relaxant- but literally have tried all of them-    6. F/U in 6-8 weeks- for trigger point injections.    I spent a total of 31 minutes on visit- 8 minutes doing injections- 23 minutes on visit- going over Trilepta and discussing  options for pain control- explained that muscle pain doesn't respond to opiates.

## 2020-11-20 ENCOUNTER — Encounter: Payer: Medicaid Other | Admitting: Physical Medicine and Rehabilitation

## 2020-11-25 LAB — TOXASSURE SELECT,+ANTIDEPR,UR

## 2020-12-02 ENCOUNTER — Telehealth: Payer: Self-pay | Admitting: *Deleted

## 2020-12-02 NOTE — Telephone Encounter (Signed)
Received a call back from [name] after being told she will be non narcotic tx only. She says that Dr Berline Chough told her she could used her CBD vape/oil as long as it came from the store and does not have THC in it. I have explained to her that it doesn't matter if it comes from a store, they cannot guarantee there is not THC in the mix. Furthermore, she is testing positive for THC in her urine. She became verbally abusive saying I wasn't listening that she was told she could use it. I reiterated her drug test is positive for THC.  She hung up on me.

## 2020-12-02 NOTE — Telephone Encounter (Signed)
Urine drug screen is positive for prescribed medications. THC is present. Unfortunately, she was given two warnings with the last being a final warning about using marijuana last November. (under the letters tab).

## 2020-12-02 NOTE — Telephone Encounter (Signed)
Well, she's been warned- We need to tell her we cannot prescribe anymore, but happy to conitnue to see her for trigger point injections- thanks- ML

## 2020-12-02 NOTE — Telephone Encounter (Signed)
I notified Tanya Krause, and she said "that's fine". She will be non narcotic treatment from now on.

## 2020-12-21 ENCOUNTER — Other Ambulatory Visit: Payer: Self-pay | Admitting: Physical Medicine and Rehabilitation

## 2020-12-21 NOTE — Telephone Encounter (Signed)
Pharmacy sending request for Hydrocodone. Last fill 11/19/20

## 2020-12-23 ENCOUNTER — Telehealth: Payer: Self-pay | Admitting: Physical Medicine and Rehabilitation

## 2020-12-23 ENCOUNTER — Telehealth: Payer: Self-pay

## 2020-12-23 NOTE — Telephone Encounter (Signed)
Was asked to contact ptn about refills based on UDS - I advised contract violation if [facility] is present in Urine - She told me RN had advised if she had paperwork that Dr. Berline Chough told her she could - did not matter. THC violates Narcotic contract with hospital -- She told me I didn't have to be nasty - I stated the two facts - didn't have time to even finish my sentence she hung up on me.

## 2020-12-23 NOTE — Telephone Encounter (Signed)
PA submitted for [employer]

## 2020-12-23 NOTE — Telephone Encounter (Signed)
Request Reference Number: [id number]. EMGALITY INJ 120MG /ML is approved through 03/23/2021.  Pharmacy notified.

## 2021-01-11 ENCOUNTER — Other Ambulatory Visit: Payer: Self-pay | Admitting: Physical Medicine and Rehabilitation

## 2021-01-22 IMAGING — MR MRI CERVICAL SPINE WITHOUT CONTRAST
4 of 5 series · 25 of 48 positions shown · non-contrast
Comparison: [HOSPITAL] [facility] CT cervical spine 08/27/2016.

CLINICAL DATA: 32-year-old female status post fall in [REDACTED].
Lower extremity weakness. Radiculopathy.

EXAM:
MRI CERVICAL SPINE WITHOUT CONTRAST
TECHNIQUE: Multiplanar, multisequence MR imaging of the cervical spine was
performed. No intravenous contrast was administered.

[Series 3: T2 post-contrast · sagittal · 3.3mm · 0.37mm/px · 8 of 13 slices shown]
[im 1/13]
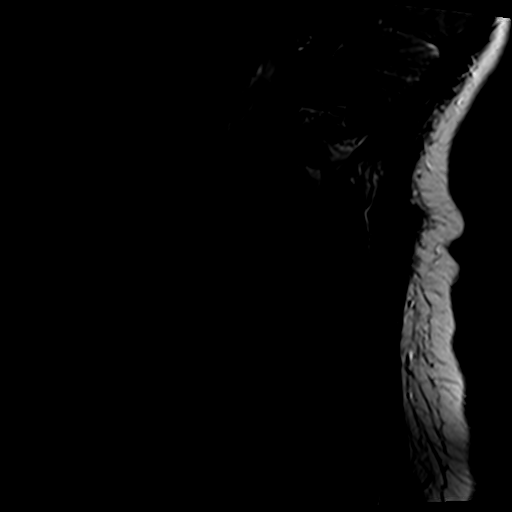
[im 2/13]
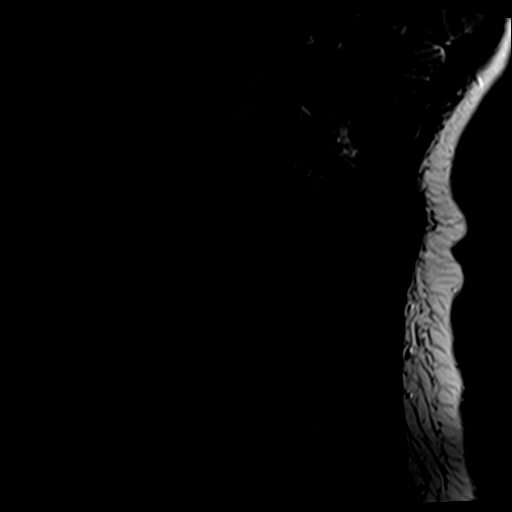
[im 4/13]
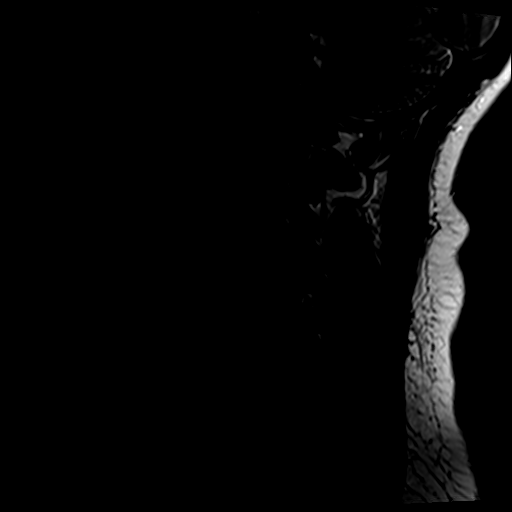
[im 6/13]
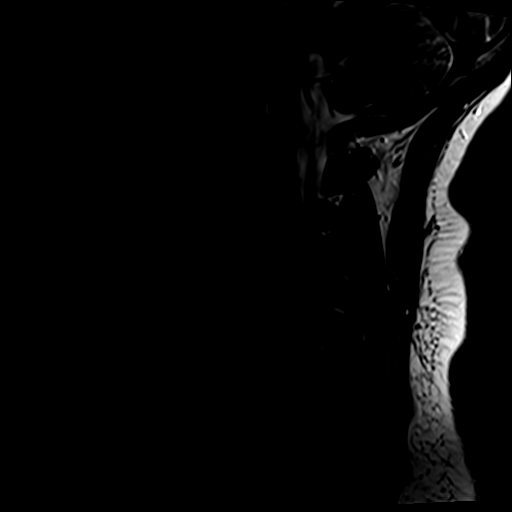
[im 7/13]
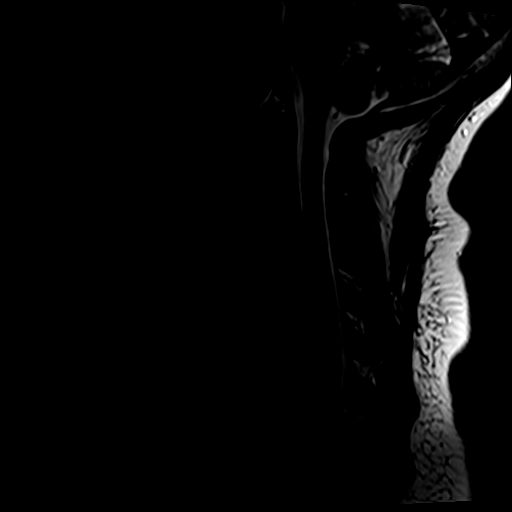
[im 9/13]
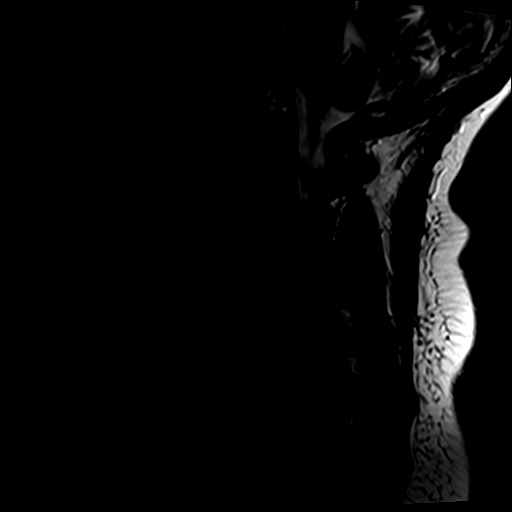
[im 11/13]
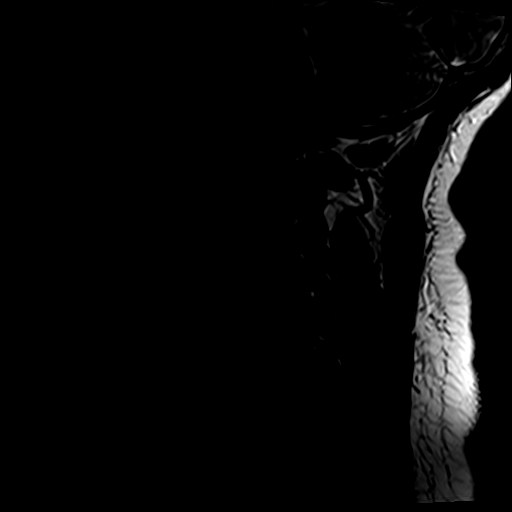
[im 13/13]
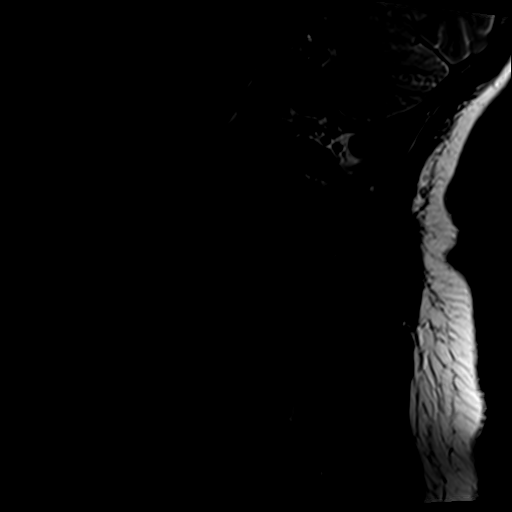

[Series 4: T1 · sagittal · 3.3mm · 0.37mm/px · 5 of 13 slices shown]
[im 1/13]
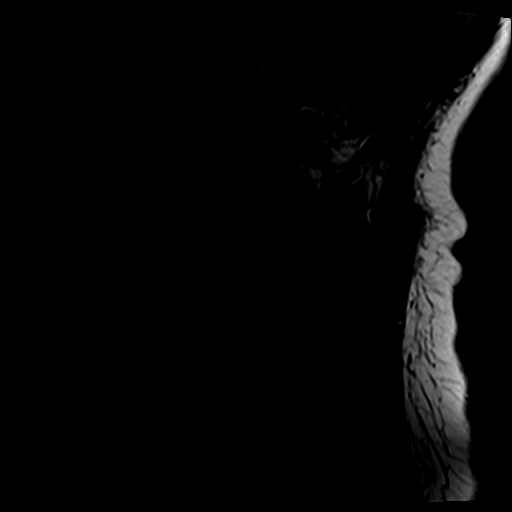
[im 3/13]
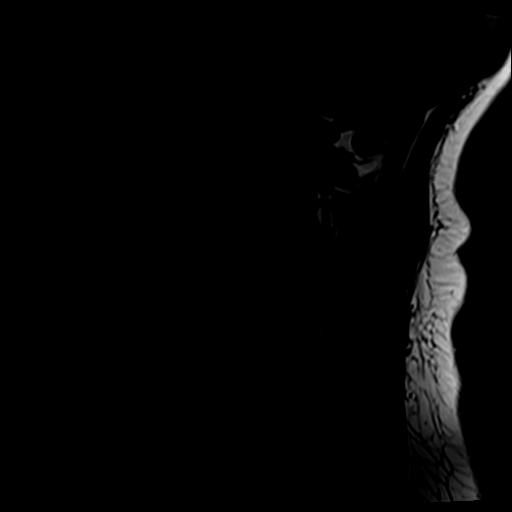
[im 5/13]
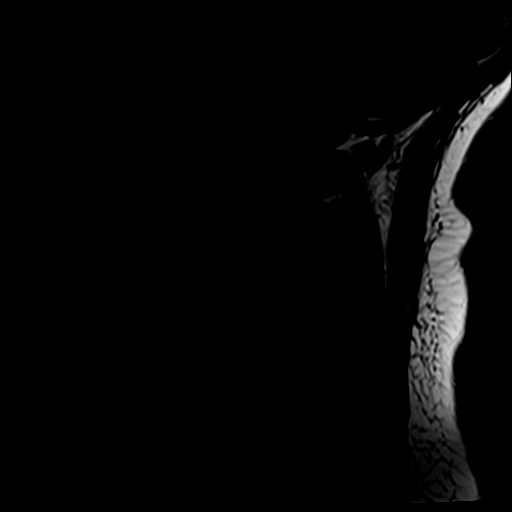
[im 7/13]
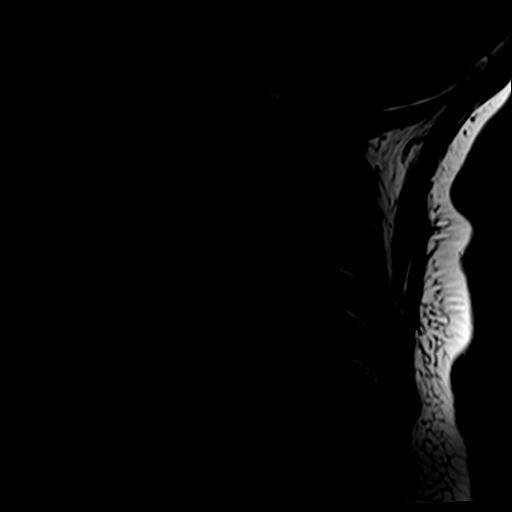
[im 11/13]
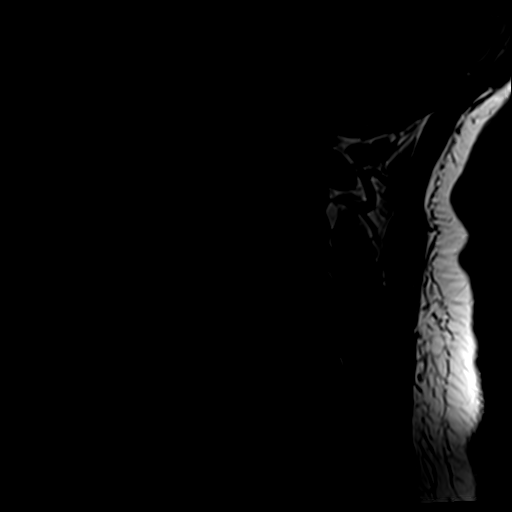

[Series 5: tir sag · sagittal · 3.3mm · 0.37mm/px · 3 of 13 slices shown]
[im 3/13]
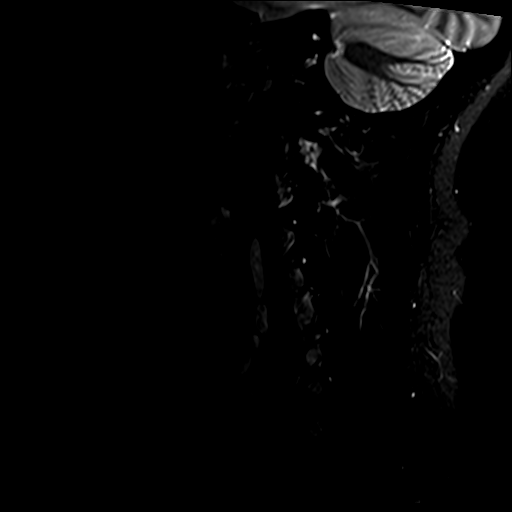
[im 7/13]
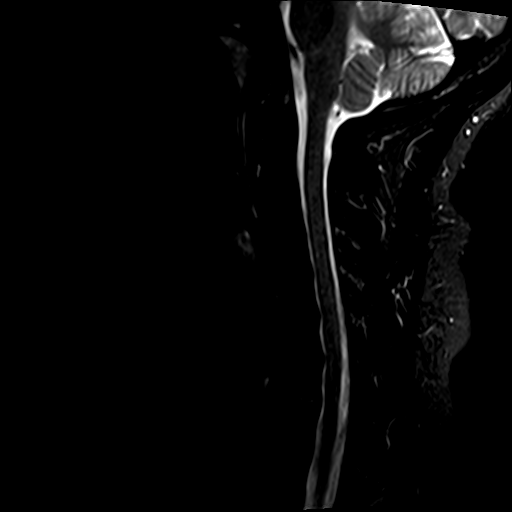
[im 11/13]
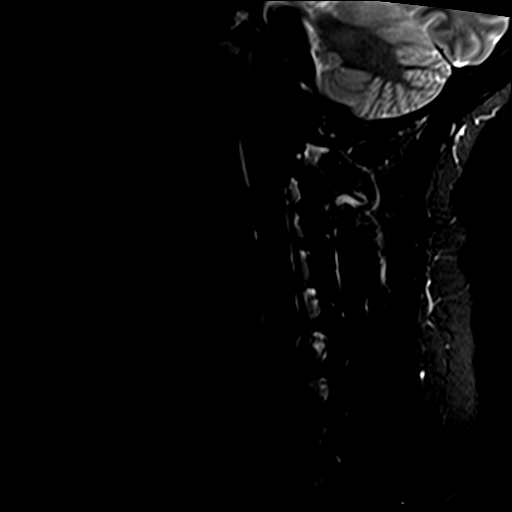

[Series 7: T2 · axial · 3.0mm · 0.70mm/px · z∈[-63,+21]mm · 9 of 22 slices shown]
[im 1/22]
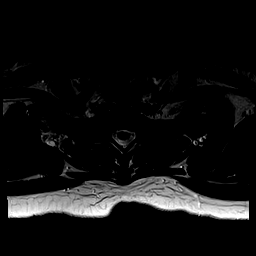
[im 4/22]
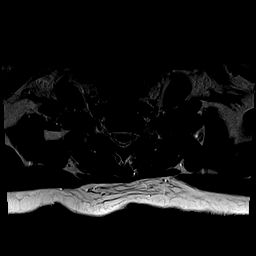
[im 6/22]
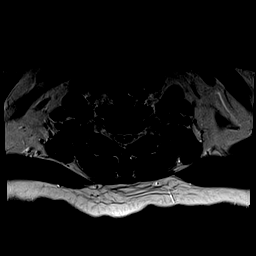
[im 10/22]
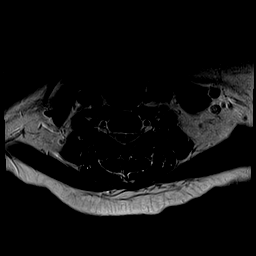
[im 12/22]
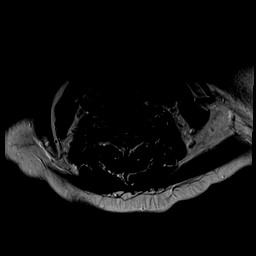
[im 16/22]
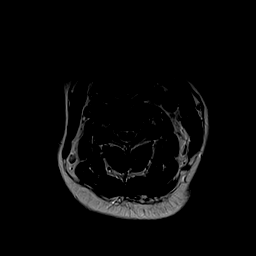
[im 18/22]
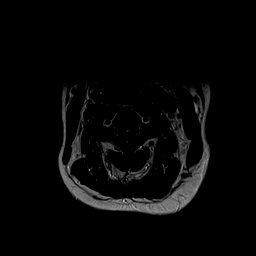
[im 20/22]
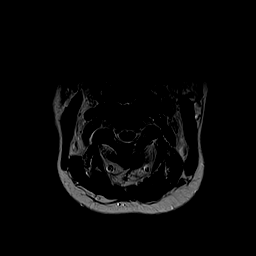
[im 22/22]
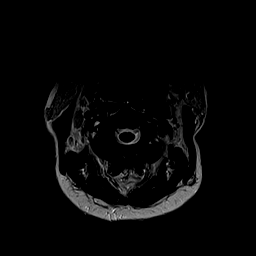

[25 of 48 positions shown; findings below may reference images not displayed]

FINDINGS: Alignment: Chronic reversal of cervical lordosis. No
spondylolisthesis.

Vertebrae: No marrow edema or evidence of acute osseous abnormality.
Visualized bone marrow signal is within normal limits.

Cord: Spinal cord signal is within normal limits at all visualized
levels.

Posterior Fossa, vertebral arteries, paraspinal tissues:
Cervicomedullary junction is within normal limits. Negative visible
posterior fossa. Preserved major vascular flow voids in the neck.
Visible neck soft tissues and lung apices are within normal limits.

Disc levels:

C2-C3:  Mild right facet hypertrophy.  No stenosis.

C3-C4: Mild endplate spurring and right facet hypertrophy.
Borderline to mild bilateral C4 foraminal stenosis.

C4-C5: Mild endplate spurring and facet hypertrophy. Tiny right
paracentral disc protrusion best seen on series 6, image 11. Mild
spinal stenosis. Mild if any spinal cord mass effect. Mild left and
borderline right C5 foraminal stenosis.

C5-C6:  Negative.

C6-C7:  Negative.

C7-T1: Mild left foraminal endplate spurring. Mild left C8 foraminal
stenosis.

Negative visible upper thoracic levels.
IMPRESSION: 1. Isolated mild disc degeneration at C4-C5 with tiny right
paracentral disc protrusion. Mild multifactorial spinal stenosis and
mild left greater than right C5 neural foraminal stenosis at that
level.
2. Mild facet and/or endplate spurring results in borderline to mild
foraminal stenosis at the bilateral C4 and left C8 nerve levels.

## 2021-02-16 ENCOUNTER — Encounter: Payer: Self-pay | Admitting: *Deleted

## 2021-02-17 ENCOUNTER — Encounter
Payer: Medicaid Other | Attending: Physical Medicine and Rehabilitation | Admitting: Physical Medicine and Rehabilitation

## 2021-02-17 DIAGNOSIS — G894 Chronic pain syndrome: Secondary | ICD-10-CM | POA: Insufficient documentation

## 2021-02-17 DIAGNOSIS — E871 Hypo-osmolality and hyponatremia: Secondary | ICD-10-CM | POA: Insufficient documentation

## 2021-02-17 DIAGNOSIS — M7918 Myalgia, other site: Secondary | ICD-10-CM | POA: Insufficient documentation

## 2021-02-17 DIAGNOSIS — G43519 Persistent migraine aura without cerebral infarction, intractable, without status migrainosus: Secondary | ICD-10-CM | POA: Insufficient documentation

## 2021-02-17 DIAGNOSIS — Z5181 Encounter for therapeutic drug level monitoring: Secondary | ICD-10-CM | POA: Insufficient documentation

## 2021-02-17 DIAGNOSIS — Z79891 Long term (current) use of opiate analgesic: Secondary | ICD-10-CM | POA: Insufficient documentation

## 2021-02-20 ENCOUNTER — Emergency Department (HOSPITAL_COMMUNITY): Payer: Medicaid Other

## 2021-02-20 ENCOUNTER — Emergency Department (HOSPITAL_COMMUNITY)
Admission: EM | Admit: 2021-02-20 | Discharge: 2021-02-20 | Disposition: A | Discharge status: Home / Self Care | Payer: Medicaid Other | Attending: Emergency Medicine | Admitting: Emergency Medicine

## 2021-02-20 ENCOUNTER — Other Ambulatory Visit: Payer: Self-pay

## 2021-02-20 ENCOUNTER — Encounter (HOSPITAL_COMMUNITY): Payer: Self-pay

## 2021-02-20 DIAGNOSIS — D5700 Hb-SS disease with crisis, unspecified: Secondary | ICD-10-CM | POA: Insufficient documentation

## 2021-02-20 DIAGNOSIS — R109 Unspecified abdominal pain: Secondary | ICD-10-CM

## 2021-02-20 DIAGNOSIS — G8929 Other chronic pain: Secondary | ICD-10-CM

## 2021-02-20 DIAGNOSIS — G894 Chronic pain syndrome: Secondary | ICD-10-CM | POA: Insufficient documentation

## 2021-02-20 DIAGNOSIS — R112 Nausea with vomiting, unspecified: Secondary | ICD-10-CM | POA: Insufficient documentation

## 2021-02-20 DIAGNOSIS — Z91040 Latex allergy status: Secondary | ICD-10-CM | POA: Insufficient documentation

## 2021-02-20 DIAGNOSIS — M5431 Sciatica, right side: Secondary | ICD-10-CM

## 2021-02-20 LAB — BASIC METABOLIC PANEL
Anion gap: 6 (ref 5–15)
BUN: 5 mg/dL — ABNORMAL LOW (ref 6–20)
CO2: 26 mmol/L (ref 22–32)
Calcium: 9.1 mg/dL (ref 8.9–10.3)
Chloride: 109 mmol/L (ref 98–111)
Creatinine, Ser: 0.86 mg/dL (ref 0.44–1.00)
GFR, Estimated: >60 mL/min (ref >60)
Glucose, Bld: 86 mg/dL (ref 70–99)
Potassium: 3.6 mmol/L (ref 3.5–5.1)
Sodium: 141 mmol/L (ref 135–145)

## 2021-02-20 LAB — CBC WITH DIFFERENTIAL/PLATELET
Abs Immature Granulocytes: 0.04 10*3/uL (ref 0.00–0.07)
Basophils Absolute: 0.1 10*3/uL (ref 0.0–0.1)
Basophils Relative: 1 %
Eosinophils Absolute: 0.5 10*3/uL (ref 0.0–0.5)
Eosinophils Relative: 6 %
HCT: 35.9 % — ABNORMAL LOW (ref 36.0–46.0)
Hemoglobin: 12.3 g/dL (ref 12.0–15.0)
Immature Granulocytes: 0 %
Lymphocytes Relative: 42 %
Lymphs Abs: 3.8 10*3/uL (ref 0.7–4.0)
MCH: 28.6 pg (ref 26.0–34.0)
MCHC: 34.3 g/dL (ref 30.0–36.0)
MCV: 83.5 fL (ref 80.0–100.0)
Monocytes Absolute: 0.7 10*3/uL (ref 0.1–1.0)
Monocytes Relative: 8 %
Neutro Abs: 4 10*3/uL (ref 1.7–7.7)
Neutrophils Relative %: 43 %
Platelets: 248 10*3/uL (ref 150–400)
RBC: 4.3 MIL/uL (ref 3.87–5.11)
RDW: 13.2 % (ref 11.5–15.5)
WBC: 9.1 10*3/uL (ref 4.0–10.5)
nRBC: 0 % (ref 0.0–0.2)

## 2021-02-20 LAB — SEDIMENTATION RATE: Sed Rate: 9 mm/hr (ref 0–22)

## 2021-02-20 LAB — C-REACTIVE PROTEIN: CRP: <0.5 mg/dL (ref <1.0)

## 2021-02-20 MED ORDER — ACETAMINOPHEN 500 MG PO TABS
1000.0000 mg | ORAL_TABLET | Freq: Once | ORAL | Status: AC
  Administered 2021-02-20: 1000 mg via ORAL
  Filled 2021-02-20: qty 2

## 2021-02-20 MED ORDER — LACTATED RINGERS IV BOLUS
1000.0000 mL | Freq: Once | INTRAVENOUS | Status: AC
  Administered 2021-02-20: 1000 mL via INTRAVENOUS

## 2021-02-20 MED ORDER — DROPERIDOL 2.5 MG/ML IJ SOLN
2.5000 mg | Freq: Once | INTRAMUSCULAR | Status: AC
  Administered 2021-02-20: 2.5 mg via INTRAVENOUS
  Filled 2021-02-20: qty 2

## 2021-02-20 NOTE — ED Notes (Signed)
MD notified of delay in ordered IVF and meds d/t pt being a difficult stick. Pt also requesting phenergan. MD notified.

## 2021-02-20 NOTE — ED Notes (Signed)
Pt transported to Xray via stretcher at this time.  ?

## 2021-02-20 NOTE — Discharge Instructions (Addendum)
You were evaluated in the Emergency Department and after careful evaluation, we did not find any emergent condition requiring admission or further testing in the hospital.  Your exam/testing today was overall reassuring.  Your physical exam was consistent with sciatica.  Your screening labs were reassuring.  Your symptoms may be consistent with THC induced hyperemesis.  Recommend you hold on THC use for the next few days. Follow-up with your pain specialist regarding your chronic pain.  Please return to the Emergency Department if you experience any worsening of your condition.  Thank you for allowing us to be a part of your care.

## 2021-02-20 NOTE — ED Triage Notes (Addendum)
Pt BIB [facility] from home C/O "sickle cell pain crisis." Pt reports abdominal pain, numbness in BLE, emesis X 3.     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

## 2021-02-20 NOTE — ED Provider Notes (Signed)
[facility] Provider Note   CSN: [id number] Arrival date & time: 02/20/21  1258     History  Chief Complaint  Patient presents with   Pain    Tanya Krause is a 36 y.o. female.  HPI  36 year old female with a medical history significant for hemoglobin C trait, no history of sickle cell anemia or [place] trait who presents to the emergency department with a complaint of sickle cell pain crisis.  On chart review, the patient also has an extensive history of chronic pain, fibromyalgia for which she follows outpatient.  On chart review the patient's PDMP, the patient is also prescribed multiple pain medications from different providers.  Per triage nursing notes, the patient complains of a sickle cell pain crisis with abdominal pain, numbness in the bilateral lower extremities and emesis x3.  Per sickle cell clinic notes on 12/19/19, "Tanya Krause is a 36 year old female that contacted sickle cell day infusion clinic for admission due to sickle cell pain crisis.  On review of patient's record, there is no history of sickle cell disease.  Patient has frequented the emergency department at [place] without a definitive diagnosis of sickle cell disease.  Reviewed previous primary care records, showed history of fibromyalgia, chronic pain syndrome, and myofascial pain syndrome.  It appears that patient has been receiving pain medications from Dr. Courtney Heys.  Her last office visit was on 11/01/2019.  Patient recently filled a prescription for hydrocodone-acetaminophen 5-325 mg #90 on 11/30/2019.  On review of PDMP, no inconsistencies were noted.   Attempted to call patient to advise her to continue with her PCP for medication management.  There are no pain management providers here.  This clinic is specifically dedicated the patient's living will sickle cell disease that are experiencing acute sickle cell pain crises.  Also, if patient is concerned  about possible diagnosis of sickle cell disease, again she can contact PCP for a hemoglobin fractionation. Attempted to reach patient several times without an answer. Voicemail has not been set up."  Per EDP care plan:       Care Plan updated: Tanya Krause , 03/2016         PCP:  Tanya Blocker, MD         PMH:                                                         Pelvic inflammatory disease                           Fibromyalgia                                                    Chronic abdominal pain                                          Migraine  Hemoglobin C trait (HCC)                               Background:          Frequent complaints include abdominal pain, chronic pain and frequent falls    Previous workups include 13 CT abdomen/pelvis scans (last CT on 05/17 revealed colitis but otherwise negative)    She has had 5 non-OB pelvic ultrasounds - most recent on 07/2014 was negative    Laboratory evaluations usually within normal limits    Review of [place] narcotic database reveals multiple narcotics prescriptions in 2017 from multiple providers         Plan:    If no emergent condition is identified and chronic pain/chronic abdominal pain suspected, please limit laboratory/imaging evaluation    If chronic pain is suspected please avoid narcotic medications in the ED and avoid narcotic prescriptions at time of discharge    Please refer to PCP for all chronic pain needs.          Home Medications  Allergies    Metoclopramide, Tomato, Aspirin, Doxycycline, Hydromorphone hcl, Ibuprofen, Ondansetron, Other, Shellfish allergy, Toradol [ketorolac tromethamine], Hydromorphone hcl, Latex, and Tramadol    Review of Systems   Review of Systems  All other systems reviewed and are negative.  Physical Exam Updated Vital Signs BP 114/79 (BP Location: Right Arm)    Pulse 96    Temp 98.1 F (36.7 C) (Oral)    Resp 14    Ht 5'  (1.524 m)    Wt 82.6 kg    LMP 12/04/2018    SpO2 97%    BMI 35.54 kg/m  Physical Exam Vitals and nursing note reviewed.  Constitutional:      General: She is not in acute distress.    Appearance: She is well-developed.     Comments: Patient appears to be in diffuse pain to light touch all over her body with no specific focality  HENT:     Head: Normocephalic and atraumatic.  Eyes:     Extraocular Movements: Extraocular movements intact.     Conjunctiva/sclera: Conjunctivae normal.     Pupils: Pupils are equal, round, and reactive to light.  Neck:     Comments: Range of motion intact Cardiovascular:     Rate and Rhythm: Normal rate and regular rhythm.     Heart sounds: No murmur heard. Pulmonary:     Effort: Pulmonary effort is normal. No respiratory distress.     Breath sounds: Normal breath sounds.  Abdominal:     General: There is no distension.     Palpations: Abdomen is soft.     Tenderness: There is no abdominal tenderness. There is no guarding.  Musculoskeletal:        General: No deformity or signs of injury.     Cervical back: Neck supple.     Comments:  Bilateral positive straight leg raise.  Extremities atraumatic with intact range of motion  Skin:    General: Skin is warm and dry.     Findings: No lesion or rash.  Neurological:     General: No focal deficit present.     Mental Status: She is alert. Mental status is at baseline.     Cranial Nerves: No cranial nerve deficit.     Comments: Cranial nerves II through XII grossly intact.  Moving all 4 extremities spontaneously.  Sensation grossly intact all 4 extremities  ED Results / Procedures / Treatments   Labs (all labs ordered are listed, but only abnormal results are displayed) Labs Reviewed  CBC WITH DIFFERENTIAL/PLATELET - Abnormal; Notable for the following components:      Result Value   HCT 35.9 (*)    All other components within normal limits  BASIC METABOLIC PANEL - Abnormal; Notable for the  following components:   BUN 5 (*)    All other components within normal limits  SEDIMENTATION RATE  C-REACTIVE PROTEIN    EKG None  Radiology DG Lumbar Spine Complete  Result Date: 02/20/2021 CLINICAL DATA:  Back pain, sickle cell pain crisis EXAM: LUMBAR SPINE - COMPLETE 4+ VIEW COMPARISON:  None. FINDINGS: There is no evidence of lumbar spine fracture. Alignment is normal. Intervertebral disc spaces are maintained. Large burden of stool in the included colon. IMPRESSION: 1. No fracture or dislocation of the lumbar spine. Disc spaces and vertebral body heights are preserved. 2.  Large burden of stool in the colon. Electronically Signed   By: Delanna Ahmadi M.D.   On: 02/20/2021 14:42    Procedures Procedures    Medications Ordered in ED Medications  lactated ringers bolus 1,000 mL (0 mLs Intravenous Stopped 02/20/21 1702)  droperidol (INAPSINE) 2.5 MG/ML injection 2.5 mg (2.5 mg Intravenous Given 02/20/21 1606)  acetaminophen (TYLENOL) tablet 1,000 mg (1,000 mg Oral Given 02/20/21 1450)    ED Course/ Medical Decision Making/ A&P                           Medical Decision Making Amount and/or Complexity of Data Reviewed Labs: ordered. Radiology: ordered.  Risk OTC drugs. Prescription drug management.   36 year old female with a medical history significant for hemoglobin C trait, no history of sickle cell anemia or [place] trait who presents to the emergency department with a complaint of sickle cell pain crisis.  On chart review, the patient also has an extensive history of chronic pain, fibromyalgia for which she follows outpatient.  On chart review the patient's PDMP, the patient is also prescribed multiple pain medications from different providers.  Per triage nursing notes, the patient complains of a sickle cell pain crisis with abdominal pain, numbness in the bilateral lower extremities and emesis x3.  Per sickle cell clinic notes on 12/19/19, "Tanya Krause is a 36 year old female  that contacted sickle cell day infusion clinic for admission due to sickle cell pain crisis.  On review of patient's record, there is no history of sickle cell disease.  Patient has frequented the emergency department at [place] without a definitive diagnosis of sickle cell disease.  Reviewed previous primary care records, showed history of fibromyalgia, chronic pain syndrome, and myofascial pain syndrome.  It appears that patient has been receiving pain medications from Dr. Courtney Heys.  Her last office visit was on 11/01/2019.  Patient recently filled a prescription for hydrocodone-acetaminophen 5-325 mg #90 on 11/30/2019.  On review of PDMP, no inconsistencies were noted.   Attempted to call patient to advise her to continue with her PCP for medication management.  There are no pain management providers here.  This clinic is specifically dedicated the patient's living will sickle cell disease that are experiencing acute sickle cell pain crises.  Also, if patient is concerned about possible diagnosis of sickle cell disease, again she can contact PCP for a hemoglobin fractionation. Attempted to reach patient several times without an answer. Voicemail has not been set up."  On arrival, the patient was afebrile, hemodynamically stable, mildly hypertensive BP 142/96, saturating 98% on room air.  Normal sinus rhythm noted on cardiac telemetry.  Patient has no significant history for sickle cell anemia has presented to multiple emergency departments for sickle cell pain crises.  She has a history of chronic abdominal pain and chronic pain syndrome in addition to fibromyalgia and has been prescribed pain medication outpatient.  She does have an ED care plan which advised strongly against advanced imaging based on presenting symptoms and physical exam.  On my physical exam, the patient had diffuse tenderness all about her body to light touch without focality.  Suspect component of myofascial pain syndrome.   Additionally, the patient presents with nausea and vomiting that is nonbloody and nonbilious.  She does utilize THC products.  Suggest component of hyperemesis related to this.  The patient was administered an IV fluid bolus and IV droperidol.  Based on my abdominal exam, I have very low suspicion for acute intra-abdominal abnormality at this time.  The patient is having normal bowel movements.  Screening laboratory work-up was significant for normal inflammatory markers, CBC without a leukocytosis or anemia, BMP that was unremarkable.  With presenting history and physical exam, doubt Bowel Obstruction, AAA, ACS, pneumonia, pneumothorax, Pyelonephritis, Nephrolithiasis, Pancreatitis, Cholecystitis, Shingles, Perforated Bowel or Ulcer, Diverticulosis/itis, Ischemic Mesentery, Inflammatory Bowel Disease, Strangulated/Incarcerated Hernia, gastritis, PUD. Patient without peritoneal signs or other indication of need for surgical intervention.  XR imaging of the lumbar spine was performed which revealed no evidence of significant scoliosis.  No evidence of acute fracture.  Large stool burden was noted.  The patient is notably weak in her bowels normally.  Disc spaces and vertebral body heights were preserved and alignment was normal.  Do not think further imaging with CT scan of the lumbar spine is warranted as the patient has no recent falls and has no midline tenderness of the lumbar spine on exam.  Her neurologic exam is also nonspecific with no focal weakness noted.  Low concern for cauda equina syndrome, no urinary incontinence or fecal incontinence.  Patient feels diffuse weakness associated with chronic pain.  I did not appreciate decrease in strength on exam.  Patient did have positive straight leg raise test on the right and left consistent with her known history of sciatica.  Therefore I do not believe that CT or MRI imaging of the spine is warranted at this time.  Presentation more consistent with  chronic pain syndrome, chronic abdominal pain, chronic back pain.  On reassessment, the patient was well-appearing, appeared well-hydrated, felt significantly symptomatically improved.  She was requesting discharge.  I believe that this is appropriate at this time with a plan for outpatient follow-up.  The patient has been appropriately medically screened and/or stabilized in the ED. I have low suspicion for any other emergent medical condition which would require further screening, evaluation or treatment in the ED or require inpatient management.     Final Clinical Impression(s) / ED Diagnoses Final diagnoses:  Nausea and vomiting, unspecified vomiting type  Chronic abdominal pain  Chronic pain syndrome  Bilateral sciatica    Rx / DC Orders ED Discharge Orders     None         Regan Lemming, MD 02/21/21 1322

## 2021-03-30 ENCOUNTER — Telehealth: Payer: Self-pay

## 2021-03-30 NOTE — Telephone Encounter (Signed)
Request Reference Number: [id number]. EMGALITY INJ 120MG /ML is approved through 03/31/2022. For further questions, call [employer] at [phone]. ?

## 2021-03-30 NOTE — Telephone Encounter (Signed)
[facility] sent to insurance through CoverMyMeds ?

## 2021-04-08 ENCOUNTER — Other Ambulatory Visit: Payer: Self-pay | Admitting: Physical Medicine and Rehabilitation

## 2021-05-07 ENCOUNTER — Other Ambulatory Visit: Payer: Self-pay | Admitting: Physical Medicine and Rehabilitation

## 2021-05-12 ENCOUNTER — Encounter: Payer: Self-pay | Admitting: Physical Medicine and Rehabilitation

## 2021-05-12 ENCOUNTER — Encounter
Payer: Medicaid Other | Attending: Physical Medicine and Rehabilitation | Admitting: Physical Medicine and Rehabilitation

## 2021-05-12 VITALS — BP 134/92 | HR 107 | Wt 186.0 lb

## 2021-05-12 DIAGNOSIS — M7918 Myalgia, other site: Secondary | ICD-10-CM | POA: Diagnosis present

## 2021-05-12 DIAGNOSIS — Z5181 Encounter for therapeutic drug level monitoring: Secondary | ICD-10-CM | POA: Diagnosis present

## 2021-05-12 DIAGNOSIS — G43519 Persistent migraine aura without cerebral infarction, intractable, without status migrainosus: Secondary | ICD-10-CM | POA: Diagnosis present

## 2021-05-12 DIAGNOSIS — Z79891 Long term (current) use of opiate analgesic: Secondary | ICD-10-CM | POA: Diagnosis present

## 2021-05-12 DIAGNOSIS — G894 Chronic pain syndrome: Secondary | ICD-10-CM | POA: Diagnosis present

## 2021-05-12 MED ORDER — QUETIAPINE FUMARATE 50 MG PO TABS: 50.0000 mg | ORAL_TABLET | Freq: Every evening | ORAL | 5 refills | Status: DC | PRN

## 2021-05-12 MED ORDER — NAPROXEN 500 MG PO TABS: 500.0000 mg | ORAL_TABLET | Freq: Two times a day (BID) | ORAL | 5 refills | Status: AC

## 2021-05-12 MED ORDER — OXCARBAZEPINE 300 MG PO TABS: 300.0000 mg | ORAL_TABLET | Freq: Every day | ORAL | 5 refills | Status: DC

## 2021-05-12 MED ORDER — PROMETHAZINE HCL 25 MG PO TABS: 25.0000 mg | ORAL_TABLET | Freq: Three times a day (TID) | ORAL | 5 refills | Status: DC | PRN

## 2021-05-12 NOTE — Patient Instructions (Signed)
Plan: ?1. Patient here for trigger point injections for ? Consent done and on chart. ? ?Cleaned areas with alcohol and injected using a 27 gauge 1.5 inch needle ? ?Injected  9cc ?Using 1% Lidocaine with no EPI ? ?Upper traps B/L  ?Levators- B/L  ?Posterior scalenes ?Middle scalenes- B/L  ?Splenius Capitus- B/L  ?Pectoralis Major- B/L  ?Rhomboids- B/L  x2 ?Infraspinatus ?Teres Major/minor ?Thoracic paraspinals- B/L x2 ?Lumbar paraspinals- B/L x2 ?Other injections- B/L deltoids, B/L hands x2; B/L soleus and B/L anterior tibialis ? ? ?Patient's level of pain prior was 9-10/10 ?Current level of pain after injections is- 8/10 ? ?There was no bleeding or complications. ? ?Patient was advised to drink a lot of water on day after injections to flush system ?Will have increased soreness for 12-48 hours after injections.  ?Can use Lidocaine patches the day AFTER injections ?Can use theracane on day of injections in places didn't inject ?Can use heating pad 4-6 hours AFTER injections  ? ? ?2. Will con't off Norco- has been able ot have regular BM's off pain meds.  ? ?3. Will refill Trileptal?oxcarbemazepine 900 mg QHS- with 5 refills ? ?4. Refill Naproxen- 500 mg BID prn- 5 refills ? ?5. Refill Seroquel 50-100 mg nightly as needed for sleep- #60- 5 refills.  ? ?6. Suggest pedal bike- sit on cough and use- work up slowly on time start at 10 minutes.  ? ?7. Try light weights- 2-5 lbs hand weights- go on youtube for arms low weight exercises.  ? ?8. F/U 3 months- but need trigger point injections q 6 weeks.  ?

## 2021-05-12 NOTE — Progress Notes (Signed)
Patient is is 36 yr old female with hx of chronic pain syndrome- with myofascial pain syndrome, fibromyalgia - Also has sickle cell disease. And chronic migraines- intractable.  ?  ? Pt is here for f/u on FMS/myofascial pain and trigger point injections.  ? ?Got approved for disability.  ? ?Here for trigger point injections.  ?Wants to get again today.  ? ? ?Asking about [employer]- explained contract said, even though legal , cannot write for opiates.  ?Trying to wean self off of Norco-  ?Had a couple left.  ?Can now have regular BM's off Norco.  ? ?CBD doing better/more tolerable- Norco also made her feel like Zombie.  ? ? ? ? ?Plan: ?1. Patient here for trigger point injections for ? Consent done and on chart. ? ?Cleaned areas with alcohol and injected using a 27 gauge 1.5 inch needle ? ?Injected  9cc ?Using 1% Lidocaine with no EPI ? ?Upper traps B/L  ?Levators- B/L  ?Posterior scalenes ?Middle scalenes- B/L  ?Splenius Capitus- B/L  ?Pectoralis Major- B/L  ?Rhomboids- B/L  x2 ?Infraspinatus ?Teres Major/minor ?Thoracic paraspinals- B/L x2 ?Lumbar paraspinals- B/L x2 ?Other injections- B/L deltoids, B/L hands x2; B/L soleus and B/L anterior tibialis ? ? ?Patient's level of pain prior was 9-10/10 ?Current level of pain after injections is- 8/10 ? ?There was no bleeding or complications. ? ?Patient was advised to drink a lot of water on day after injections to flush system ?Will have increased soreness for 12-48 hours after injections.  ?Can use Lidocaine patches the day AFTER injections ?Can use theracane on day of injections in places didn't inject ?Can use heating pad 4-6 hours AFTER injections  ? ? ?2. Will con't off Norco- has been able ot have regular BM's off pain meds.  ? ?3. Will refill Trileptal?oxcarbemazepine 900 mg QHS- with 5 refills ? ?4. Refill Naproxen- 500 mg BID prn- 5 refills ? ?5. Refill Seroquel 50-100 mg nightly as needed for sleep- #60- 5 refills.  ? ?6. Suggest pedal bike- sit on cough and  use- work up slowly on time start at 10 minutes.  ? ?7. Try light weights- 2-5 lbs hand weights- go on youtube for arms low weight exercises.  ? ?8. F/U 3 months- but need trigger point injections q 6 weeks.  ? ? ?I spent a total of  34  minutes on total care today- >50% coordination of care- due to 10 minutes on injections and 24 minutes on f/u- discussing options for pain control/weight loss.  ? ?

## 2021-06-07 ENCOUNTER — Emergency Department (HOSPITAL_COMMUNITY): Payer: Medicaid Other

## 2021-06-07 ENCOUNTER — Encounter (HOSPITAL_COMMUNITY): Payer: Self-pay

## 2021-06-07 ENCOUNTER — Emergency Department (HOSPITAL_COMMUNITY)
Admission: EM | Admit: 2021-06-07 | Discharge: 2021-06-07 | Disposition: A | Discharge status: Home / Self Care | Payer: Medicaid Other | Attending: Emergency Medicine | Admitting: Emergency Medicine

## 2021-06-07 DIAGNOSIS — Z7951 Long term (current) use of inhaled steroids: Secondary | ICD-10-CM | POA: Diagnosis not present

## 2021-06-07 DIAGNOSIS — Z20822 Contact with and (suspected) exposure to covid-19: Secondary | ICD-10-CM | POA: Diagnosis not present

## 2021-06-07 DIAGNOSIS — Z91040 Latex allergy status: Secondary | ICD-10-CM | POA: Insufficient documentation

## 2021-06-07 DIAGNOSIS — J45909 Unspecified asthma, uncomplicated: Secondary | ICD-10-CM | POA: Diagnosis not present

## 2021-06-07 DIAGNOSIS — M791 Myalgia, unspecified site: Secondary | ICD-10-CM

## 2021-06-07 DIAGNOSIS — J069 Acute upper respiratory infection, unspecified: Secondary | ICD-10-CM | POA: Diagnosis not present

## 2021-06-07 DIAGNOSIS — R0981 Nasal congestion: Secondary | ICD-10-CM | POA: Diagnosis present

## 2021-06-07 LAB — RETICULOCYTES
Immature Retic Fract: 29.3 % — ABNORMAL HIGH (ref 2.3–15.9)
RBC.: 4.25 MIL/uL (ref 3.87–5.11)
Retic Count, Absolute: 67.2 10*3/uL (ref 19.0–186.0)
Retic Ct Pct: 1.6 % (ref 0.4–3.1)

## 2021-06-07 LAB — CBC WITH DIFFERENTIAL/PLATELET
Abs Immature Granulocytes: 0.02 10*3/uL (ref 0.00–0.07)
Basophils Absolute: 0.1 10*3/uL (ref 0.0–0.1)
Basophils Relative: 1 %
Eosinophils Absolute: 0.7 10*3/uL — ABNORMAL HIGH (ref 0.0–0.5)
Eosinophils Relative: 9 %
HCT: 35.5 % — ABNORMAL LOW (ref 36.0–46.0)
Hemoglobin: 12.4 g/dL (ref 12.0–15.0)
Immature Granulocytes: 0 %
Lymphocytes Relative: 35 %
Lymphs Abs: 3 10*3/uL (ref 0.7–4.0)
MCH: 28.7 pg (ref 26.0–34.0)
MCHC: 34.9 g/dL (ref 30.0–36.0)
MCV: 82.2 fL (ref 80.0–100.0)
Monocytes Absolute: 0.7 10*3/uL (ref 0.1–1.0)
Monocytes Relative: 8 %
Neutro Abs: 4 10*3/uL (ref 1.7–7.7)
Neutrophils Relative %: 47 %
Platelets: 276 10*3/uL (ref 150–400)
RBC: 4.32 MIL/uL (ref 3.87–5.11)
RDW: 13.6 % (ref 11.5–15.5)
WBC: 8.6 10*3/uL (ref 4.0–10.5)
nRBC: 0 % (ref 0.0–0.2)

## 2021-06-07 LAB — I-STAT BETA HCG BLOOD, ED (MC, WL, AP ONLY): I-stat hCG, quantitative: 7.1 m[IU]/mL — ABNORMAL HIGH (ref <5)

## 2021-06-07 LAB — RESP PANEL BY RT-PCR (FLU A&B, COVID) ARPGX2
Influenza A by PCR: NEGATIVE
Influenza B by PCR: NEGATIVE
SARS Coronavirus 2 by RT PCR: NEGATIVE

## 2021-06-07 LAB — BASIC METABOLIC PANEL
Anion gap: 6 (ref 5–15)
BUN: 9 mg/dL (ref 6–20)
CO2: 26 mmol/L (ref 22–32)
Calcium: 8.9 mg/dL (ref 8.9–10.3)
Chloride: 106 mmol/L (ref 98–111)
Creatinine, Ser: 0.78 mg/dL (ref 0.44–1.00)
GFR, Estimated: >60 mL/min (ref >60)
Glucose, Bld: 97 mg/dL (ref 70–99)
Potassium: 3.9 mmol/L (ref 3.5–5.1)
Sodium: 138 mmol/L (ref 135–145)

## 2021-06-07 LAB — HCG, QUANTITATIVE, PREGNANCY: hCG, Beta Chain, Quant, S: 3 m[IU]/mL (ref <5)

## 2021-06-07 MED ORDER — DIPHENHYDRAMINE HCL 25 MG PO CAPS
25.0000 mg | ORAL_CAPSULE | Freq: Once | ORAL | Status: AC
  Administered 2021-06-07: 25 mg via ORAL
  Filled 2021-06-07: qty 1

## 2021-06-07 MED ORDER — FLUTICASONE PROPIONATE 50 MCG/ACT NA SUSP: 2.0000 | Freq: Every day | NASAL | 0 refills | Status: DC

## 2021-06-07 MED ORDER — PROCHLORPERAZINE EDISYLATE 10 MG/2ML IJ SOLN
5.0000 mg | Freq: Once | INTRAMUSCULAR | Status: AC
  Administered 2021-06-07: 5 mg via INTRAVENOUS
  Filled 2021-06-07: qty 2

## 2021-06-07 MED ORDER — DEXTROSE-NACL 5-0.45 % IV SOLN: INTRAVENOUS | Status: DC

## 2021-06-07 MED ORDER — HYDROMORPHONE HCL 1 MG/ML IJ SOLN
1.0000 mg | Freq: Once | INTRAMUSCULAR | Status: AC
Start: 2021-06-07 — End: 2021-06-07
  Administered 2021-06-07: 1 mg via INTRAVENOUS
  Filled 2021-06-07: qty 1

## 2021-06-07 MED ORDER — MORPHINE SULFATE (PF) 4 MG/ML IV SOLN
6.0000 mg | INTRAVENOUS | Status: AC
  Administered 2021-06-07: 6 mg via INTRAVENOUS
  Filled 2021-06-07: qty 2

## 2021-06-07 MED ORDER — SODIUM CHLORIDE 0.9 % IV BOLUS
1000.0000 mL | Freq: Once | INTRAVENOUS | Status: AC
  Administered 2021-06-07: 1000 mL via INTRAVENOUS

## 2021-06-07 MED ORDER — MORPHINE SULFATE (PF) 4 MG/ML IV SOLN: 8.0000 mg | INTRAVENOUS | Status: DC

## 2021-06-07 NOTE — Discharge Instructions (Signed)
Follow-up with your primary care doctor if your symptoms are not improving.  Return to emergency room if you have any worsening symptoms. 

## 2021-06-07 NOTE — ED Notes (Signed)
Pt stated "they normally give me benadryl and dilaudid".

## 2021-06-07 NOTE — ED Provider Notes (Signed)
[facility] DEPT Provider Note   CSN: [id number] Arrival date & time: 06/07/21  2979     History  Chief Complaint  Patient presents with   Pain    Tanya Krause is a 36 y.o. female.  Patient is a 36 year old female who presents with pain all over.  She said that she is had a 2-day history of some runny nose and congestion.  She also had a temp up to 101.2 yesterday.  She has had some nausea and vomiting.  She says she hurts all over.  She feels like its consistent with her sickle cell anemia type pain.  She feels short of breath as well.  She does have a history of asthma associated with URIs but no ongoing asthma symptoms.  She does not use inhalers at home.  She is not on oxygen.  No pleuritic pain.  No leg pain or swelling other than muscle pain all over.  On chart review, she has a history of sickle cell trait, fibromyalgia and chronic pain.  I do not see that she is actually been diagnosed with sickle cell anemia.  She most recently has been seen in pain management clinic.      Home Medications Prior to Admission medications   Medication Sig Start Date End Date Taking? Authorizing Provider  acetaminophen (TYLENOL) 500 MG tablet Take 1,000 mg by mouth every 6 (six) hours as needed for moderate pain.    [provider]  Acetaminophen-Codeine 300-30 MG tablet Take by mouth.    [provider]  albuterol (PROVENTIL) (2.5 MG/3ML) 0.083% nebulizer solution Inhale into the lungs. 06/20/20   [provider]  amLODipine (NORVASC) 10 MG tablet Take 10 mg by mouth daily. 07/29/20   [provider]  busPIRone (BUSPAR) 10 MG tablet Take 10 mg by mouth 3 (three) times daily. 09/23/20   [provider]  cyclobenzaprine (FLEXERIL) 10 MG tablet TAKE ONE TABLET BY MOUTH THREE TIMES A DAY. AS NEEDED FOR MUSCLE SPASMS Patient taking differently: Take 10 mg by mouth 3 (three) times daily as needed for muscle spasms. 09/24/20    Lovorn, Jinny Blossom, MD  diclofenac (VOLTAREN) 50 MG EC tablet Take 1 tablet (50 mg total) by mouth 2 (two) times daily as needed (for swelling of hands/tendinitis). 03/20/20   Lovorn, Jinny Blossom, MD  diphenhydrAMINE (BENADRYL) 25 MG tablet Take 25 mg by mouth every 6 (six) hours as needed for itching or allergies.    [provider]  EMGALITY 120 MG/ML SOAJ Inject 1 each into the skin every 30 (thirty) days. No matter what- to decrease migraines- 04/24/20   Lovorn, Jinny Blossom, MD  FLOVENT HFA 220 MCG/ACT inhaler Inhale 2 puffs into the lungs 2 (two) times daily. 12/03/19   [provider]  [place] TEST KIT See admin instructions. 10/27/20   [provider]  FLUoxetine (PROZAC) 20 MG capsule Take 60 mg by mouth every morning. 07/29/20   [provider]  FLUoxetine (PROZAC) 40 MG capsule Take 80 mg by mouth every morning. 09/23/20   [provider]  fluticasone (FLONASE) 50 MCG/ACT nasal spray Place 2 sprays into both nostrils daily. 06/07/21   Malvin Johns, MD  folic acid (FOLVITE) 892 MCG tablet Take 400 mcg by mouth daily.    [provider]  hydrochlorothiazide (HYDRODIURIL) 25 MG tablet Take 25 mg by mouth daily. 10/15/20   [provider]  hydrOXYzine (ATARAX/VISTARIL) 25 MG tablet Take 1 tablet (25 mg total) by  mouth every 6 (six) hours as needed for anxiety. 08/27/20   Jacqlyn Larsen, PA-C  lidocaine (LIDODERM) 5 % Place 1 patch onto the skin daily. Remove & Discard patch within 12 hours or as directed by MD 11/16/20   Delia Heady, PA-C  naproxen (NAPROSYN) 500 MG tablet Take 1 tablet (500 mg total) by mouth 2 (two) times daily with a meal. 05/12/21   Lovorn, Megan, MD  NURTEC 75 MG TBDP Take 1 tablet by mouth daily as needed. 10/31/20   [provider]  nystatin (MYCOSTATIN) 100000 UNIT/ML suspension TAKE ONE TEASPOONFUL BY MOUTH FOUR TIMES A DAY 04/09/21   Lovorn, Jinny Blossom, MD  Oxcarbazepine (TRILEPTAL) 300 MG tablet Take 1 tablet  (300 mg total) by mouth at bedtime. - for nerve pain 05/12/21   Lovorn, Jinny Blossom, MD  pantoprazole (PROTONIX) 40 MG tablet TAKE ONE TABLET BY MOUTH ONCE DAILY FOR REFLUX OR HEARTBURN 09/25/20   Lovorn, Jinny Blossom, MD  PROAIR HFA 108 (90 Base) MCG/ACT inhaler Inhale 2-4 puffs into the lungs every 4 (four) hours as needed for wheezing or shortness of breath. 07/29/20   [provider]  promethazine (PHENERGAN) 25 MG tablet Take 1 tablet (25 mg total) by mouth every 8 (eight) hours as needed for nausea or vomiting. 05/12/21   Lovorn, Jinny Blossom, MD  promethazine (PHENERGAN) 6.25 MG/5ML syrup TAKE 10 ML BY MOUTH FOUR TIMES A DAY AS NEEDED FOR NAUSEA OR VOMITING 05/07/21   Lovorn, Jinny Blossom, MD  QUEtiapine (SEROQUEL) 50 MG tablet Take 1-2 tablets (50-100 mg total) by mouth at bedtime as needed (sleep). 05/12/21   Lovorn, Jinny Blossom, MD  REXULTI 0.5 MG TABS Take 1 tablet by mouth every evening. 09/24/20   [provider]  REXULTI 1 MG TABS tablet Take 1 mg by mouth daily. 10/22/20   [provider]  Rimegepant Sulfate 75 MG TBDP Take by mouth. 10/23/20   [provider]  SUMAtriptan (IMITREX) 100 MG tablet TAKE 1 TABLET (100 MG TOTAL) BY MOUTH EVERY 2 (TWO) HOURS AS NEEDED FOR MIGRAINE MAY REPEAT IN 2 HOURS IF HEADACHE PERSISTS OR RECURS. 10/23/20   Lovorn, Jinny Blossom, MD  triamcinolone (KENALOG) 0.025 % cream Apply 1 application topically 2 (two) times daily. 11/16/20   [provider]  venlafaxine (EFFEXOR) 37.5 MG tablet Take 37.5 mg by mouth every morning. 02/20/20   [provider]      Allergies    Metoclopramide, Tomato, Aspirin, Doxycycline, Hydromorphone hcl, Ibuprofen, Ondansetron, Other, Shellfish allergy, Toradol [ketorolac tromethamine], Hydromorphone hcl, Latex, and Tramadol    Review of Systems   Review of Systems  Constitutional:  Positive for fatigue and fever. Negative for chills and diaphoresis.  HENT:  Positive for congestion and rhinorrhea. Negative for sneezing.    Eyes: Negative.   Respiratory:  Positive for cough and shortness of breath. Negative for chest tightness.   Cardiovascular:  Positive for chest pain. Negative for leg swelling.  Gastrointestinal:  Positive for nausea and vomiting. Negative for abdominal pain, blood in stool and diarrhea.  Genitourinary:  Negative for difficulty urinating, flank pain, frequency and hematuria.  Musculoskeletal:  Positive for myalgias. Negative for arthralgias and back pain.  Skin:  Negative for rash.  Neurological:  Negative for dizziness, speech difficulty, weakness, numbness and headaches.   Physical Exam Updated Vital Signs BP (!) 140/94   Pulse 95   Temp 98.3 F (36.8 C) (Oral)   Resp 14   Ht 5' (1.524 m)   Wt 84.8 kg   LMP 12/04/2018  SpO2 100%   BMI 36.52 kg/m  Physical Exam Constitutional:      Appearance: She is well-developed.  HENT:     Head: Normocephalic and atraumatic.     Mouth/Throat:     Mouth: Mucous membranes are moist.     Pharynx: No oropharyngeal exudate or posterior oropharyngeal erythema.  Eyes:     Pupils: Pupils are equal, round, and reactive to light.  Cardiovascular:     Rate and Rhythm: Normal rate and regular rhythm.     Heart sounds: Normal heart sounds.  Pulmonary:     Effort: Pulmonary effort is normal. No respiratory distress.     Breath sounds: Normal breath sounds. No wheezing or rales.  Chest:     Chest wall: No tenderness.  Abdominal:     General: Bowel sounds are normal.     Palpations: Abdomen is soft.     Tenderness: There is no abdominal tenderness. There is no guarding or rebound.  Musculoskeletal:        General: Normal range of motion.     Cervical back: Normal range of motion and neck supple.     Comments: No joint swelling or erythema, no leg swelling or calf tenderness  Lymphadenopathy:     Cervical: No cervical adenopathy.  Skin:    General: Skin is warm and dry.     Findings: No rash.  Neurological:     Mental Status: She is alert  and oriented to person, place, and time.    ED Results / Procedures / Treatments   Labs (all labs ordered are listed, but only abnormal results are displayed) Labs Reviewed  CBC WITH DIFFERENTIAL/PLATELET - Abnormal; Notable for the following components:      Result Value   HCT 35.5 (*)    Eosinophils Absolute 0.7 (*)    All other components within normal limits  RETICULOCYTES - Abnormal; Notable for the following components:   Immature Retic Fract 29.3 (*)    All other components within normal limits  I-STAT BETA HCG BLOOD, ED ([facility], [facility], [facility] ONLY) - Abnormal; Notable for the following components:   I-stat hCG, quantitative 7.1 (*)    All other components within normal limits  RESP PANEL BY RT-PCR (FLU A&B, COVID) [id number]  BASIC METABOLIC PANEL  HCG, QUANTITATIVE, PREGNANCY    EKG EKG Interpretation  Date/Time:  Monday Jun 07 2021 08:38:51 EDT Ventricular Rate:  115 PR Interval:  135 QRS Duration: 91 QT Interval:  351 QTC Calculation: 486 R Axis:   61 Text Interpretation: Sinus tachycardia Borderline prolonged QT interval since last tracing no significant change Confirmed by Malvin Johns [phone]) on 06/07/2021 9:38:54 AM  Radiology DG Chest 2 View  Result Date: 06/07/2021 CLINICAL DATA:  SOB, chest pain EXAM: CHEST - 2 VIEW COMPARISON:  Chest x-ray November 16, 2020. FINDINGS: No consolidation. No visible pleural effusions or pneumothorax. Cardiomediastinal silhouette is similar to prior. No displaced fracture. IMPRESSION: No evidence of acute cardiopulmonary disease. Electronically Signed   By: Margaretha Sheffield M.D.   On: 06/07/2021 10:29    Procedures Procedures    Medications Ordered in ED Medications  morphine (PF) 4 MG/ML injection 6 mg (6 mg Intravenous Given 06/07/21 1013)  prochlorperazine (COMPAZINE) injection 5 mg (5 mg Intravenous Given 06/07/21 1013)  HYDROmorphone (DILAUDID) injection 1 mg (1 mg Intravenous Given 06/07/21 1106)  diphenhydrAMINE (BENADRYL)  capsule 25 mg (25 mg Oral Given 06/07/21 1106)  sodium chloride 0.9 % bolus 1,000 mL (1,000 mLs Intravenous New Bag/Given  06/07/21 1210)    ED Course/ Medical Decision Making/ A&P                           Medical Decision Making Amount and/or Complexity of Data Reviewed Labs: ordered. Radiology: ordered.  Risk Prescription drug management.   Patient is a 36 year old female who presents with some URI symptoms and generalized pain.  She also complained of some chest pain and shortness of breath.  Chest x-ray was performed which shows no pneumonia, pulmonary edema, pneumothorax or other acute abnormality.  This was interpreted by me and confirmed by the radiologist.  Her COVID/flu test was negative.  Other labs are reviewed and are nonconcerning.  On chart review, I do not see that she actually has a history of sickle cell anemia.  She does have a history of sickle cell trait, fibromyalgia and chronic pain.  I suspect she has URI with associated myalgias and worsening chronic pain.  She was treated here in the ED with IV fluids and pain control.  She is feeling better.  She does not have any ongoing shortness of breath.  Her lung sounds are clear.  She has no hypoxia.  No persistent tachycardia.  Her most recent heart rate was 84.  No symptoms that sound more concerning for PE.  She was discharged home in good condition.  She was given a prescription for Flonase.  She was encouraged to follow-up with her PCP if her symptoms are not improving.  Return precautions were given.  Final Clinical Impression(s) / ED Diagnoses Final diagnoses:  Upper respiratory tract infection, unspecified type  Myalgia    Rx / DC Orders ED Discharge Orders          Ordered    fluticasone (FLONASE) 50 MCG/ACT nasal spray  Daily        06/07/21 1310              Malvin Johns, MD 06/07/21 1313

## 2021-06-07 NOTE — ED Notes (Signed)
Pt given a warm blanket as requested. No other needs currently.

## 2021-06-07 NOTE — ED Triage Notes (Signed)
Pt arrived via POV, c/o pain throughout entire body. C/o chest pain and SOB

## 2021-06-07 NOTE — ED Notes (Signed)
Pt placed on a bedpan to urinate as she stated she gets around with a walker at home and she did not have one with her.

## 2021-06-22 ENCOUNTER — Other Ambulatory Visit: Payer: Self-pay | Admitting: Physical Medicine and Rehabilitation

## 2021-10-18 ENCOUNTER — Emergency Department (HOSPITAL_COMMUNITY)
Admission: EM | Admit: 2021-10-18 | Discharge: 2021-10-19 | Discharge status: Against Medical Advice | Payer: Medicaid Other | Attending: Emergency Medicine | Admitting: Emergency Medicine

## 2021-10-18 ENCOUNTER — Encounter (HOSPITAL_COMMUNITY): Payer: Self-pay

## 2021-10-18 DIAGNOSIS — R112 Nausea with vomiting, unspecified: Secondary | ICD-10-CM | POA: Insufficient documentation

## 2021-10-18 DIAGNOSIS — R300 Dysuria: Secondary | ICD-10-CM | POA: Insufficient documentation

## 2021-10-18 DIAGNOSIS — R109 Unspecified abdominal pain: Secondary | ICD-10-CM | POA: Diagnosis present

## 2021-10-18 DIAGNOSIS — Z5321 Procedure and treatment not carried out due to patient leaving prior to being seen by health care provider: Secondary | ICD-10-CM | POA: Insufficient documentation

## 2021-10-18 LAB — COMPREHENSIVE METABOLIC PANEL
ALT: 16 U/L (ref 0–44)
AST: 18 U/L (ref 15–41)
Albumin: 4.1 g/dL (ref 3.5–5.0)
Alkaline Phosphatase: 63 U/L (ref 38–126)
Anion gap: 7 (ref 5–15)
BUN: 9 mg/dL (ref 6–20)
CO2: 25 mmol/L (ref 22–32)
Calcium: 9.5 mg/dL (ref 8.9–10.3)
Chloride: 106 mmol/L (ref 98–111)
Creatinine, Ser: 0.66 mg/dL (ref 0.44–1.00)
GFR, Estimated: >60 mL/min (ref >60)
Glucose, Bld: 87 mg/dL (ref 70–99)
Potassium: 3.6 mmol/L (ref 3.5–5.1)
Sodium: 138 mmol/L (ref 135–145)
Total Bilirubin: 0.4 mg/dL (ref 0.3–1.2)
Total Protein: 7.4 g/dL (ref 6.5–8.1)

## 2021-10-18 LAB — CBC WITH DIFFERENTIAL/PLATELET
Abs Immature Granulocytes: 0.04 10*3/uL (ref 0.00–0.07)
Basophils Absolute: 0.1 10*3/uL (ref 0.0–0.1)
Basophils Relative: 1 %
Eosinophils Absolute: 0.4 10*3/uL (ref 0.0–0.5)
Eosinophils Relative: 4 %
HCT: 37 % (ref 36.0–46.0)
Hemoglobin: 12.7 g/dL (ref 12.0–15.0)
Immature Granulocytes: 0 %
Lymphocytes Relative: 50 %
Lymphs Abs: 5 10*3/uL — ABNORMAL HIGH (ref 0.7–4.0)
MCH: 27.9 pg (ref 26.0–34.0)
MCHC: 34.3 g/dL (ref 30.0–36.0)
MCV: 81.3 fL (ref 80.0–100.0)
Monocytes Absolute: 0.6 10*3/uL (ref 0.1–1.0)
Monocytes Relative: 6 %
Neutro Abs: 3.9 10*3/uL (ref 1.7–7.7)
Neutrophils Relative %: 39 %
Platelets: 309 10*3/uL (ref 150–400)
RBC: 4.55 MIL/uL (ref 3.87–5.11)
RDW: 13.6 % (ref 11.5–15.5)
WBC: 10 10*3/uL (ref 4.0–10.5)
nRBC: 0 % (ref 0.0–0.2)

## 2021-10-18 LAB — URINALYSIS, ROUTINE W REFLEX MICROSCOPIC
Bilirubin Urine: NEGATIVE
Glucose, UA: NEGATIVE mg/dL
Hgb urine dipstick: NEGATIVE
Ketones, ur: NEGATIVE mg/dL
Leukocytes,Ua: NEGATIVE
Nitrite: NEGATIVE
Protein, ur: NEGATIVE mg/dL
Specific Gravity, Urine: 1.01 (ref 1.005–1.030)
pH: 6 (ref 5.0–8.0)

## 2021-10-18 LAB — LIPASE, BLOOD: Lipase: 38 U/L (ref 11–51)

## 2021-10-18 LAB — HCG, QUANTITATIVE, PREGNANCY: hCG, Beta Chain, Quant, S: 3 m[IU]/mL (ref <5)

## 2021-10-18 NOTE — ED Provider Triage Note (Signed)
Emergency Medicine Provider Triage Evaluation Note  REMAS SOBEL , a 36 y.o. female  was evaluated in triage.  Pt complains of abdominal pain, flank pain, dysuria. She states that same has been ongoing x 2-3 weeks and has been progressively worsening over the past 3-4 days. Endorses associated nausea and vomiting. No fevers or chills.   Review of Systems  Positive:  Negative:   Physical Exam  BP (!) 143/101 (BP Location: Right Arm)   Pulse (!) 107   Temp 98.2 F (36.8 C) (Oral)   Resp 18   LMP 12/04/2018   SpO2 93%  Gen:   Awake, no distress   Resp:  Normal effort  MSK:   Moves extremities without difficulty  Other:    Medical Decision Making  Medically screening exam initiated at 3:59 PM.  Appropriate orders placed.  [place] M Verrill was informed that the remainder of the evaluation will be completed by another provider, this initial triage assessment does not replace that evaluation, and the importance of remaining in the ED until their evaluation is complete.     Bud Face, PA-C 10/18/21 1600

## 2021-10-18 NOTE — ED Triage Notes (Addendum)
Pt arrived via POV, c/o flank pain, dysruia.

## 2021-10-24 ENCOUNTER — Emergency Department (HOSPITAL_COMMUNITY)
Admission: EM | Admit: 2021-10-24 | Discharge: 2021-10-24 | Disposition: A | Discharge status: Home / Self Care | Payer: Medicaid Other | Attending: Emergency Medicine | Admitting: Emergency Medicine

## 2021-10-24 ENCOUNTER — Encounter (HOSPITAL_COMMUNITY): Payer: Self-pay | Admitting: Emergency Medicine

## 2021-10-24 ENCOUNTER — Other Ambulatory Visit: Payer: Self-pay

## 2021-10-24 ENCOUNTER — Emergency Department (HOSPITAL_COMMUNITY): Payer: Medicaid Other

## 2021-10-24 DIAGNOSIS — M791 Myalgia, unspecified site: Secondary | ICD-10-CM | POA: Diagnosis present

## 2021-10-24 DIAGNOSIS — Z794 Long term (current) use of insulin: Secondary | ICD-10-CM | POA: Diagnosis not present

## 2021-10-24 DIAGNOSIS — R112 Nausea with vomiting, unspecified: Secondary | ICD-10-CM | POA: Diagnosis not present

## 2021-10-24 DIAGNOSIS — Z91040 Latex allergy status: Secondary | ICD-10-CM | POA: Diagnosis not present

## 2021-10-24 DIAGNOSIS — D573 Sickle-cell trait: Secondary | ICD-10-CM | POA: Insufficient documentation

## 2021-10-24 LAB — RETICULOCYTES
Immature Retic Fract: 30.3 % — ABNORMAL HIGH (ref 2.3–15.9)
RBC.: 4.88 MIL/uL (ref 3.87–5.11)
Retic Count, Absolute: 87.8 10*3/uL (ref 19.0–186.0)
Retic Ct Pct: 1.8 % (ref 0.4–3.1)

## 2021-10-24 LAB — CBC WITH DIFFERENTIAL/PLATELET
Abs Immature Granulocytes: 0.04 10*3/uL (ref 0.00–0.07)
Basophils Absolute: 0.1 10*3/uL (ref 0.0–0.1)
Basophils Relative: 1 %
Eosinophils Absolute: 0.4 10*3/uL (ref 0.0–0.5)
Eosinophils Relative: 5 %
HCT: 40.7 % (ref 36.0–46.0)
Hemoglobin: 13.7 g/dL (ref 12.0–15.0)
Immature Granulocytes: 0 %
Lymphocytes Relative: 54 %
Lymphs Abs: 5.1 10*3/uL — ABNORMAL HIGH (ref 0.7–4.0)
MCH: 27.7 pg (ref 26.0–34.0)
MCHC: 33.7 g/dL (ref 30.0–36.0)
MCV: 82.4 fL (ref 80.0–100.0)
Monocytes Absolute: 0.6 10*3/uL (ref 0.1–1.0)
Monocytes Relative: 6 %
Neutro Abs: 3.2 10*3/uL (ref 1.7–7.7)
Neutrophils Relative %: 34 %
Platelets: 315 10*3/uL (ref 150–400)
RBC: 4.94 MIL/uL (ref 3.87–5.11)
RDW: 13.7 % (ref 11.5–15.5)
WBC: 9.4 10*3/uL (ref 4.0–10.5)
nRBC: 0 % (ref 0.0–0.2)

## 2021-10-24 LAB — I-STAT BETA HCG BLOOD, ED (MC, WL, AP ONLY): I-stat hCG, quantitative: <5 m[IU]/mL (ref <5)

## 2021-10-24 MED ORDER — DIPHENHYDRAMINE HCL 25 MG PO CAPS
25.0000 mg | ORAL_CAPSULE | Freq: Once | ORAL | Status: AC
  Administered 2021-10-24: 25 mg via ORAL
  Filled 2021-10-24: qty 1

## 2021-10-24 MED ORDER — SODIUM CHLORIDE 0.9 % IV BOLUS
1000.0000 mL | Freq: Once | INTRAVENOUS | Status: AC
  Administered 2021-10-24: 1000 mL via INTRAVENOUS

## 2021-10-24 MED ORDER — PROCHLORPERAZINE EDISYLATE 10 MG/2ML IJ SOLN
5.0000 mg | Freq: Once | INTRAMUSCULAR | Status: AC
  Administered 2021-10-24: 5 mg via INTRAVENOUS
  Filled 2021-10-24: qty 2

## 2021-10-24 MED ORDER — HYDROMORPHONE HCL 1 MG/ML IJ SOLN
0.5000 mg | Freq: Once | INTRAMUSCULAR | Status: AC
  Administered 2021-10-24: 0.5 mg via INTRAVENOUS
  Filled 2021-10-24: qty 1

## 2021-10-24 NOTE — Discharge Instructions (Signed)
It was a pleasure taking care of you today!  You have labs pending at this time, these will result on your MyChart.  Continue to take your prescription medications as prescribed to you.  Maintain follow-up with your care team regarding today's ED visit.  You may return to the emergency department for experiencing increasing/worsening symptoms.

## 2021-10-24 NOTE — ED Provider Notes (Signed)
[facility] DEPT Provider Note   CSN: [id number] Arrival date & time: 10/24/21  0910     History  Chief Complaint  Patient presents with   Sickle Cell Pain Crisis    Tanya Krause is a 36 y.o. female with a past medical history of fibromyalgia, chronic pain syndrome, sickle cell trait who presents emergency department with concerns for worsening pain over the past day.  Notes that she has pain to her bilateral legs and right shoulder/neck.  Also has associated nausea and vomiting.  Notes that while she does not have an official diagnosis of sickle cell anemia/disease, when she was in [place] she was in the sickle cell pain clinic and treated as a sickle cell patient.  Notes that she is receiving hydrocodone at this time.  Has tried that without relief of her symptoms.  She notes that her pain feels similar to her history of fibromyalgia however pain is worse today.  Notes pain is primarily on her right side however she does have pain diffusely all over her body.  No recent fall, injury, trauma.  The history is provided by the patient. No language interpreter was used.       Home Medications Prior to Admission medications   Medication Sig Start Date End Date Taking? Authorizing Provider  acetaminophen (TYLENOL) 500 MG tablet Take 1,000 mg by mouth every 6 (six) hours as needed for moderate pain.    [provider]  Acetaminophen-Codeine 300-30 MG tablet Take by mouth.    [provider]  albuterol (PROVENTIL) (2.5 MG/3ML) 0.083% nebulizer solution Inhale into the lungs. 06/20/20   [provider]  amLODipine (NORVASC) 10 MG tablet Take 10 mg by mouth daily. 07/29/20   [provider]  busPIRone (BUSPAR) 10 MG tablet Take 10 mg by mouth 3 (three) times daily. 09/23/20   [provider]  cyclobenzaprine (FLEXERIL) 10 MG tablet TAKE ONE TABLET BY MOUTH THREE TIMES A DAY. AS NEEDED FOR MUSCLE SPASMS  06/22/21   Lovorn, Jinny Blossom, MD  diclofenac (VOLTAREN) 50 MG EC tablet Take 1 tablet (50 mg total) by mouth 2 (two) times daily as needed (for swelling of hands/tendinitis). 03/20/20   Lovorn, Jinny Blossom, MD  diphenhydrAMINE (BENADRYL) 25 MG tablet Take 25 mg by mouth every 6 (six) hours as needed for itching or allergies.    [provider]  EMGALITY 120 MG/ML SOAJ Inject 1 each into the skin every 30 (thirty) days. No matter what- to decrease migraines- 04/24/20   Lovorn, Jinny Blossom, MD  FLOVENT HFA 220 MCG/ACT inhaler Inhale 2 puffs into the lungs 2 (two) times daily. 12/03/19   [provider]  [address] TEST KIT See admin instructions. 10/27/20   [provider]  FLUoxetine (PROZAC) 20 MG capsule Take 60 mg by mouth every morning. 07/29/20   [provider]  FLUoxetine (PROZAC) 40 MG capsule Take 80 mg by mouth every morning. 09/23/20   [provider]  fluticasone (FLONASE) 50 MCG/ACT nasal spray Place 2 sprays into both nostrils daily. 06/07/21   Malvin Johns, MD  folic acid (FOLVITE) 625 MCG tablet Take 400 mcg by mouth daily.    [provider]  hydrochlorothiazide (HYDRODIURIL) 25 MG tablet Take 25 mg by mouth daily. 10/15/20   [provider]  hydrOXYzine (ATARAX/VISTARIL) 25 MG tablet Take 1 tablet (25 mg total) by mouth every 6 (six) hours as needed for anxiety. 08/27/20   Jacqlyn Larsen, PA-C  lidocaine (LIDODERM) 5 % Place 1 patch onto the skin daily. Remove & Discard patch within 12 hours or as directed by MD 11/16/20   Delia Heady, PA-C  naproxen (NAPROSYN) 500 MG tablet Take 1 tablet (500 mg total) by mouth 2 (two) times daily with a meal. 05/12/21   Lovorn, Megan, MD  NURTEC 75 MG TBDP Take 1 tablet by mouth daily as needed. 10/31/20   [provider]  nystatin (MYCOSTATIN) 100000 UNIT/ML suspension TAKE ONE TEASPOONFUL BY MOUTH FOUR TIMES A DAY 04/09/21   Lovorn, Jinny Blossom, MD  Oxcarbazepine (TRILEPTAL) 300 MG tablet Take  1 tablet (300 mg total) by mouth at bedtime. - for nerve pain 05/12/21   Lovorn, Jinny Blossom, MD  pantoprazole (PROTONIX) 40 MG tablet TAKE ONE TABLET BY MOUTH ONCE DAILY FOR REFLUX OR HEARTBURN 09/25/20   Lovorn, Jinny Blossom, MD  PROAIR HFA 108 (90 Base) MCG/ACT inhaler Inhale 2-4 puffs into the lungs every 4 (four) hours as needed for wheezing or shortness of breath. 07/29/20   [provider]  promethazine (PHENERGAN) 25 MG tablet Take 1 tablet (25 mg total) by mouth every 8 (eight) hours as needed for nausea or vomiting. 05/12/21   Lovorn, Jinny Blossom, MD  promethazine (PHENERGAN) 6.25 MG/5ML syrup TAKE 10 ML BY MOUTH FOUR TIMES A DAY AS NEEDED FOR NAUSEA OR VOMITING 05/07/21   Lovorn, Jinny Blossom, MD  QUEtiapine (SEROQUEL) 50 MG tablet Take 1-2 tablets (50-100 mg total) by mouth at bedtime as needed (sleep). 05/12/21   Lovorn, Jinny Blossom, MD  REXULTI 0.5 MG TABS Take 1 tablet by mouth every evening. 09/24/20   [provider]  REXULTI 1 MG TABS tablet Take 1 mg by mouth daily. 10/22/20   [provider]  Rimegepant Sulfate 75 MG TBDP Take by mouth. 10/23/20   [provider]  SUMAtriptan (IMITREX) 100 MG tablet TAKE 1 TABLET (100 MG TOTAL) BY MOUTH EVERY 2 (TWO) HOURS AS NEEDED FOR MIGRAINE MAY REPEAT IN 2 HOURS IF HEADACHE PERSISTS OR RECURS. 10/23/20   Lovorn, Jinny Blossom, MD  triamcinolone (KENALOG) 0.025 % cream Apply 1 application topically 2 (two) times daily. 11/16/20   [provider]  venlafaxine (EFFEXOR) 37.5 MG tablet Take 37.5 mg by mouth every morning. 02/20/20   [provider]      Allergies    Ketorolac, Metoclopramide, Tomato, Aspirin, Doxycycline, Hydromorphone, Hydromorphone hcl, Ibuprofen, Ondansetron, Other, Shellfish allergy, Toradol [ketorolac tromethamine], Hydromorphone hcl, Latex, and Tramadol    Review of Systems   Review of Systems  All other systems reviewed and are negative.   Physical Exam Updated Vital Signs BP (!) 166/134   Pulse (!) 106   Temp 98  F (36.7 C) (Oral)   Resp 16   Ht 5' (1.524 m)   Wt 85.7 kg   LMP 12/04/2018   SpO2 97%   BMI 36.91 kg/m  Physical Exam Vitals and nursing note reviewed.  Constitutional:      General: She is not in acute distress.    Appearance: She is not diaphoretic.  HENT:     Head: Normocephalic and atraumatic.     Mouth/Throat:     Pharynx: No oropharyngeal exudate.  Eyes:     General: No scleral icterus.    Conjunctiva/sclera: Conjunctivae normal.  Cardiovascular:     Rate and Rhythm: Normal rate and regular rhythm.     Pulses: Normal pulses.     Heart sounds: Normal heart sounds.  Pulmonary:     Effort: Pulmonary effort is normal. No  respiratory distress.     Breath sounds: Normal breath sounds. No wheezing.  Abdominal:     General: Bowel sounds are normal. There is no distension.     Palpations: Abdomen is soft.  Musculoskeletal:        General: Normal range of motion.     Cervical back: Normal range of motion and neck supple.     Comments: Tenderness to palpation noted to light touch diffusely throughout the body.  No overlying skin changes, no swelling, ecchymosis noted.  Skin:    General: Skin is warm and dry.  Neurological:     Mental Status: She is alert.  Psychiatric:        Behavior: Behavior normal.     ED Results / Procedures / Treatments   Labs (all labs ordered are listed, but only abnormal results are displayed) Labs Reviewed  CBC WITH DIFFERENTIAL/PLATELET - Abnormal; Notable for the following components:      Result Value   Lymphs Abs 5.1 (*)    All other components within normal limits  RETICULOCYTES - Abnormal; Notable for the following components:   Immature Retic Fract 30.3 (*)    All other components within normal limits  I-STAT BETA HCG BLOOD, ED ([facility], [facility], [facility] ONLY)    EKG EKG Interpretation  Date/Time:  Sunday October 24 2021 09:27:20 EDT Ventricular Rate:  115 PR Interval:  124 QRS Duration: 92 QT Interval:  346 QTC Calculation: 479 R  Axis:   53 Text Interpretation: Sinus tachycardia No significant change since prior 5/23 Confirmed by Aletta Edouard [phone]) on 10/24/2021 9:41:15 AM  Radiology DG Chest 1 View  Result Date: 10/24/2021 CLINICAL DATA:  Chest pain EXAM: CHEST  1 VIEW COMPARISON:  Chest x-ray Jun 07, 2021 FINDINGS: The cardiomediastinal silhouette is unchanged in contour. No focal pulmonary opacity. No pleural effusion or pneumothorax. Visualized upper abdomen is unremarkable. No acute osseous abnormality. IMPRESSION: No acute cardiopulmonary abnormality. Electronically Signed   By: Beryle Flock M.D.   On: 10/24/2021 11:06    Procedures Procedures    Medications Ordered in ED Medications - No data to display  ED Course/ Medical Decision Making/ A&P Clinical Course as of 10/24/21 1347  Sun Oct 24, 2021  1253 Notified by RN that patient needs to leave due to her grandmother passing while she was in the emergency department.  Vital signs overall improved on the monitor.  Symptoms improved with treatment regimen in the ED.  Patient with labs still pending at this time, discussed with patient that she can follow-up in the emergency department if she is experiencing increasing/worsening symptoms. [SB]    Clinical Course User Index [SB] Daqwan Dougal A, PA-C                           Medical Decision Making Amount and/or Complexity of Data Reviewed Labs: ordered. Radiology: ordered.  Risk Prescription drug management.   Pt presents with concerns.  Diffuse pain throughout body x1 day.  Notes that her pain is primarily to the right neck/shoulder.  Also has associated nausea and vomiting.  Has a history of sickle cell trait.  Notes that she was followed by sickle cell pain clinic in [facility] and is in the process of getting into the sickle cell clinic locally.  Has tried prescription hydrocodone without relief of her symptoms. Vital signs, patient afebrile, initially tachycardic and tachypneic.  On exam, pt with Tenderness to palpation noted to light  touch diffusely throughout the body.  No overlying skin changes, no swelling, ecchymosis noted.  Tachycardia noted on exam otherwise no acute cardiovascular, respiratory, abdominal exam findings.    Co morbidities that complicate the patient evaluation: Sickle cell trait Fibromyalgia Chronic pain syndrome  Additional history obtained:  External records from outside source obtained and reviewed including: Patient was evaluated in the emergency department for similar symptoms earlier this year. PDMP reviewed.  Patient sent a prescription for 05/20/2023 hydrocodone-acetaminophen on 08/19/2021.  Labs:  I ordered, and personally interpreted labs.  The pertinent results include:   CBC without leukocytosis. Reticulocytes otherwise unremarkable. I-STAT beta-hCG unremarkable. BNP discontinued at time of discharge.  Imaging:  I ordered and personally interpreted imaging. The pertinent results include:  CXR without acute cardiopulmonary findings.   Medications:  I ordered medication including IV fluids, Benadryl, Compazine, Dilaudid pain for pain management Reevaluation of the patient after these medicines and interventions, I reevaluated the patient and found that they have improved I have reviewed the patients home medicines and have made adjustments as needed   Disposition: Presentation suspicious for likely exacerbation of fibromyalgia and chronic pain.  At this time doubt concerns for sickle cell pain crisis.  Patient is establishing outpatient follow-up with sickle cell clinic.  After consideration of the diagnostic results and the patients response to treatment, I feel that the patient would benefit from Discharge home.  Patient instructed to continue with her pain medications at home.  Also instructed to follow-up with her primary care provider as needed.  Supportive care measures and strict return precautions discussed with patient at  bedside. Pt acknowledges and verbalizes understanding. Pt appears safe for discharge. Follow up as indicated in discharge paperwork.    This chart was dictated using voice recognition software, Dragon. Despite the best efforts of this provider to proofread and correct errors, errors may still occur which can change documentation meaning.   Final Clinical Impression(s) / ED Diagnoses Final diagnoses:  Myalgia  Sickle cell trait [facility])    Rx / DC Orders ED Discharge Orders     None         Donye Campanelli A, PA-C 10/24/21 1348    Hayden Rasmussen, MD 10/24/21 1750

## 2021-10-24 NOTE — ED Triage Notes (Signed)
SCC x1 day with pain to bilateral legs and right neck/shoulder with N/v

## 2022-01-21 ENCOUNTER — Telehealth: Payer: Self-pay | Admitting: Physical Medicine and Rehabilitation

## 2022-01-21 NOTE — Telephone Encounter (Signed)
Did you want me to make an appt for injection?

## 2022-01-21 NOTE — Telephone Encounter (Signed)
Patient is calling to request an injection.  Patient hasn't been here since 04/2021.  Please advise.  Thank you.

## 2022-01-21 NOTE — Telephone Encounter (Signed)
Will need to make an appointment and put on wait list- thanks ML

## 2022-02-11 ENCOUNTER — Encounter
Payer: Medicaid Other | Attending: Physical Medicine and Rehabilitation | Admitting: Physical Medicine and Rehabilitation

## 2022-02-11 ENCOUNTER — Encounter: Payer: Self-pay | Admitting: Physical Medicine and Rehabilitation

## 2022-02-11 VITALS — HR 90 | Ht 60.0 in | Wt 179.0 lb

## 2022-02-11 DIAGNOSIS — G894 Chronic pain syndrome: Secondary | ICD-10-CM

## 2022-02-11 DIAGNOSIS — R112 Nausea with vomiting, unspecified: Secondary | ICD-10-CM | POA: Diagnosis not present

## 2022-02-11 DIAGNOSIS — G43919 Migraine, unspecified, intractable, without status migrainosus: Secondary | ICD-10-CM | POA: Insufficient documentation

## 2022-02-11 DIAGNOSIS — M797 Fibromyalgia: Secondary | ICD-10-CM | POA: Diagnosis not present

## 2022-02-11 DIAGNOSIS — M7918 Myalgia, other site: Secondary | ICD-10-CM | POA: Diagnosis not present

## 2022-02-11 MED ORDER — EMGALITY 120 MG/ML ~~LOC~~ SOAJ: 1.0000 | SUBCUTANEOUS | 11 refills | Status: AC

## 2022-02-11 MED ORDER — PROMETHAZINE HCL 25 MG PO TABS: 25.0000 mg | ORAL_TABLET | Freq: Three times a day (TID) | ORAL | 5 refills | Status: DC | PRN

## 2022-02-11 MED ORDER — OXCARBAZEPINE 300 MG PO TABS: 300.0000 mg | ORAL_TABLET | Freq: Every day | ORAL | 5 refills | Status: DC

## 2022-02-11 MED ORDER — CYCLOBENZAPRINE HCL 10 MG PO TABS: 10.0000 mg | ORAL_TABLET | Freq: Three times a day (TID) | ORAL | 5 refills | Status: DC | PRN

## 2022-02-11 MED ORDER — QUETIAPINE FUMARATE 50 MG PO TABS: 50.0000 mg | ORAL_TABLET | Freq: Every evening | ORAL | 5 refills | Status: DC | PRN

## 2022-02-11 MED ORDER — RIMEGEPANT SULFATE 75 MG PO TBDP: 75.0000 mg | ORAL_TABLET | Freq: Every day | ORAL | 5 refills | Status: DC | PRN

## 2022-02-11 MED ORDER — LIDOCAINE 5 % EX OINT: 1.0000 | TOPICAL_OINTMENT | Freq: Four times a day (QID) | CUTANEOUS | 5 refills | Status: DC | PRN

## 2022-02-11 MED ORDER — LIDOCAINE HCL 1 % IJ SOLN
9.0000 mL | Freq: Once | INTRAMUSCULAR | Status: AC
  Administered 2022-02-11: 9 mL

## 2022-02-11 NOTE — Progress Notes (Signed)
Patient is is 37 yr old female with hx of chronic pain syndrome- with myofascial pain syndrome, fibromyalgia - Also has sickle cell disease. And chronic migraines- intractable.     Pt is here for f/u on FMS/myofascial pain and trigger point injections.     Last seen 4/23.  Moving back and forth Finally found a stable place in [place].  Not living with parents anymore.   Has a grandbaby. Which is new  Has run out of all meds.    Fell and hit head yesterday Jumped up so fast afterwards.  Trying to go into door- and hit head on tile floor where leg went out on her- LLE- went out on her.    Plan: Lidocaine cream 50 grams- up to 4x/day as needed  2. Will restart Emgality 120 mg shot q 30 days- 11 refills.   3. Needs Flexeril- needs refill  4. Seroquel 50-100 mg QHS for sleep- sen tin refills- 5 RF's  5. Con't Nurtec- restart 75 mg  qday prn #8 with 5 refills. Has complicated migraines with vertigo and word finding deficits- so neurological Sx's-also without results with triptans- Nurtec has worked well in past and needs to continue it.   6. Trileptal- oxcarbemazipine- 300 mg nightly x 1 week, then 600 mg nightly x 1 week, then 900 mg nightly- for nerve pain.   7. Refill Phenergan 25 mg 1/2 to 1 pill as needed q 8 hours for nausea/vomiting- #90 with 5 refills.   8. Patient here for trigger point injections for  Consent done and on chart.  Cleaned areas with alcohol and injected using a 27 gauge 1.5 inch needle  Injected 9cc Using 1% Lidocaine with no EPI  Upper traps B/L x2 Levators B/L  Posterior scalenes Middle scalenes- B/L  Splenius Capitus B/L  Pectoralis Major B/L  Rhomboids B/L x3 Infraspinatus Teres Major/minor B/L  Thoracic paraspinals B/L x3 Lumbar paraspinals B/L x2 Other injections-  piriformis area B/L , 2 spot sin hands B/L and forearms BL   Patient's level of pain prior was  9/5-10/10 Current level of pain after injections is- still 8.5-9/10 but  getting better  There was no bleeding or complications.  Patient was advised to drink a lot of water on day after injections to flush system Will have increased soreness for 12-48 hours after injections.  Can use Lidocaine patches the day AFTER injections Can use theracane on day of injections in places didn't inject Can use heating pad 4-6 hours AFTER injections    9. F/U in 2 months- and every 2 months.    I spent a total of  26  minutes on total care today- >50% coordination of care- due to 10 minutes on injections and res ton f/u of pain- restarting meds and educating how to increase trileptal.

## 2022-02-11 NOTE — Patient Instructions (Signed)
Plan: Lidocaine cream 50 grams- up to 4x/day as needed  2. Will restart Emgality 120 mg shot q 30 days- 11 refills.   3. Needs Flexeril- needs refill  4. Seroquel 50-100 mg QHS for sleep- sen tin refills- 5 RF's  5. Con't Nurtec- restart 75 mg  qday prn #8 with 5 refills. Has complicated migraines with vertigo and word finding deficits- so neurological Sx's-also without results with triptans- Nurtec has worked well in past and needs to continue it.   6. Trileptal- oxcarbemazipine- 300 mg nightly x 1 week, then 600 mg nightly x 1 week, then 900 mg nightly- for nerve pain.   7. Refill Phenergan 25 mg 1/2 to 1 pill as needed q 8 hours for nausea/vomiting- #90 with 5 refills.   8. Patient here for trigger point injections for  Consent done and on chart.  Cleaned areas with alcohol and injected using a 27 gauge 1.5 inch needle  Injected 9cc Using 1% Lidocaine with no EPI  Upper traps B/L x2 Levators B/L  Posterior scalenes Middle scalenes- B/L  Splenius Capitus B/L  Pectoralis Major B/L  Rhomboids B/L x3 Infraspinatus Teres Major/minor B/L  Thoracic paraspinals B/L x3 Lumbar paraspinals B/L x2 Other injections-  piriformis area B/L , 2 spot sin hands B/L and forearms BL   Patient's level of pain prior was  9/5-10/10 Current level of pain after injections is- still 8.5-9/10 but getting better  There was no bleeding or complications.  Patient was advised to drink a lot of water on day after injections to flush system Will have increased soreness for 12-48 hours after injections.  Can use Lidocaine patches the day AFTER injections Can use theracane on day of injections in places didn't inject Can use heating pad 4-6 hours AFTER injections    9. F/U in 2 months- and every 2 months.

## 2022-02-14 ENCOUNTER — Telehealth: Payer: Self-pay

## 2022-02-14 NOTE — Telephone Encounter (Signed)
Pa submitted Tanya Krause (Key: [id number])

## 2022-02-25 ENCOUNTER — Ambulatory Visit: Payer: Medicaid Other | Admitting: Physical Medicine and Rehabilitation

## 2022-03-02 ENCOUNTER — Telehealth: Payer: Self-pay

## 2022-03-02 NOTE — Telephone Encounter (Signed)
PA for [facility] submitted

## 2022-04-11 ENCOUNTER — Telehealth: Payer: Self-pay | Admitting: *Deleted

## 2022-04-11 ENCOUNTER — Encounter: Payer: Self-pay | Admitting: Physical Medicine and Rehabilitation

## 2022-04-11 ENCOUNTER — Encounter
Payer: Medicaid Other | Attending: Physical Medicine and Rehabilitation | Admitting: Physical Medicine and Rehabilitation

## 2022-04-11 VITALS — BP 135/93 | HR 104 | Ht 60.0 in | Wt 178.0 lb

## 2022-04-11 DIAGNOSIS — G8929 Other chronic pain: Secondary | ICD-10-CM | POA: Diagnosis present

## 2022-04-11 DIAGNOSIS — M797 Fibromyalgia: Secondary | ICD-10-CM | POA: Insufficient documentation

## 2022-04-11 DIAGNOSIS — M549 Dorsalgia, unspecified: Secondary | ICD-10-CM | POA: Insufficient documentation

## 2022-04-11 DIAGNOSIS — E871 Hypo-osmolality and hyponatremia: Secondary | ICD-10-CM | POA: Diagnosis not present

## 2022-04-11 DIAGNOSIS — G43719 Chronic migraine without aura, intractable, without status migrainosus: Secondary | ICD-10-CM | POA: Diagnosis not present

## 2022-04-11 DIAGNOSIS — M7918 Myalgia, other site: Secondary | ICD-10-CM | POA: Diagnosis present

## 2022-04-11 MED ORDER — GABAPENTIN 300 MG PO CAPS: 300.0000 mg | ORAL_CAPSULE | Freq: Three times a day (TID) | ORAL | 5 refills | Status: DC

## 2022-04-11 MED ORDER — LIDOCAINE HCL 1 % IJ SOLN
9.0000 mL | Freq: Once | INTRAMUSCULAR | Status: AC
  Administered 2022-04-11: 9 mL

## 2022-04-11 NOTE — Progress Notes (Signed)
Patient is is 37 yr old female with hx of chronic pain syndrome- with myofascial pain syndrome, fibromyalgia - Also has sickle cell disease. And chronic migraines- intractable.     Pt is here for f/u on FMS/myofascial pain and trigger point injections.      Hasn't been to sleep since hurting so bad- in low back and legs- and a lot of burning and stinging in tibialis anterior area in calves B/L   A lot of stinging and burning-  and legs starting to shake lately-   Migraines still bad- still 4-5x/week-  Taking Emgality and Nurtec- used to work, but not working for her anymore.   Having to stay in dark room mos tof time.  Saw eye doctor- wants to do "more tests" for HA's.     Plan:  Patient here for trigger point injections for  Consent done and on chart.  Cleaned areas with alcohol and injected using a 27 gauge 1.5 inch needle  Injected 9cc- no wastage Using 1% Lidocaine with no EPI  Upper traps B/L  Levators B/L  Posterior scalenes Middle scalenes B/L  Splenius Capitus- B/L  Pectoralis Major- B/L  Rhomboids- B/L x2 Infraspinatus Teres Major/minor Thoracic paraspinals- B/L x2 Lumbar paraspinals B/L x3 and 1 more on R lateral paraspinals Other injections-  B/L forearms, B/L hands x2 and B/L anterior tibilis and B/L gastroc   Patient's level of pain prior was 9.5/10 Current level of pain after injections is- doesn't feel better yet- but usually helpful.   There was no bleeding or complications.  Patient was advised to drink a lot of water on day after injections to flush system Will have increased soreness for 12-48 hours after injections.  Can use Lidocaine patches the day AFTER injections Can use theracane on day of injections in places didn't inject Can use heating pad 4-6 hours AFTER injections  2. Asking to try Gabapentin again- 300 mg 3x/day x 1 week, then can increase to 600 mg 3x/day for nerve pain/fibromyalgia.    3. Trileptal/Oxcarbamazepine - needs to get  BMP to make sure sodium not too low. Is common with Trileptal-   4. Has refills on Emgality, Nurtec, and Phenergan as well as Flexeril.   5. Have ordered BMP/lab, but pt wants to come get tomorrow, since so sleepy.    6. Referral to Neurology- [place] due to worsening migraines to try and help get them under control-    7. F/U in 6 -8 weeks- q 6 weeks for trigger point injections-    I spent a total of  31  minutes on total care today- >50% coordination of care- due to 10 minutes on injections and 21 minutes on discussing appointments; labs due ot possible hyponatremia; and referral to Neuro.

## 2022-04-11 NOTE — Telephone Encounter (Signed)
JAXSYN ERBES (Key: [id number]) [id number] Emgality 120MG /ML auto-injectors (migraine) Status: PA Response - ApprovedCreated: March 22nd, 2024 ([id number]) [id number]: March 25th, 2024

## 2022-04-11 NOTE — Patient Instructions (Signed)
Plan:  Patient here for trigger point injections for  Consent done and on chart.  Cleaned areas with alcohol and injected using a 27 gauge 1.5 inch needle  Injected 9cc- no wastage Using 1% Lidocaine with no EPI  Upper traps B/L  Levators B/L  Posterior scalenes Middle scalenes B/L  Splenius Capitus- B/L  Pectoralis Major- B/L  Rhomboids- B/L x2 Infraspinatus Teres Major/minor Thoracic paraspinals- B/L x2 Lumbar paraspinals B/L x3 and 1 more on R lateral paraspinals Other injections-  B/L forearms, B/L hands x2 and B/L anterior tibilis and B/L gastroc   Patient's level of pain prior was 9.5/10 Current level of pain after injections is- doesn't feel better yet- but usually helpful.   There was no bleeding or complications.  Patient was advised to drink a lot of water on day after injections to flush system Will have increased soreness for 12-48 hours after injections.  Can use Lidocaine patches the day AFTER injections Can use theracane on day of injections in places didn't inject Can use heating pad 4-6 hours AFTER injections  2. Asking to try Gabapentin again- 300 mg 3x/day x 1 week, then can increase to 600 mg 3x/day for nerve pain/fibromyalgia.    3. Trileptal/Oxcarbamazepine - needs to get BMP to make sure sodium not too low. Is common with Trileptal-   4. Has refills on Emgality, Nurtec, and Phenergan as well as Flexeril.   5. Have ordered BMP/lab, but pt wants to come get tomorrow, since so sleepy.    6. Referral to Neurology- [place] due to worsening migraines to try and help get them under control-    7. F/U in 6 -8 weeks- q 6 weeks for trigger point injections-

## 2022-04-11 NOTE — Telephone Encounter (Signed)
JACY SALYARDS (Key: [id number]) [id number] Emgality 120MG /ML auto-injectors (migraine) Status: PA RequestCreated: March 22nd, 2024 ([id number]) [id number]: March 25th, 2024 Open

## 2022-04-13 ENCOUNTER — Ambulatory Visit: Payer: Medicaid Other | Admitting: Physical Medicine and Rehabilitation

## 2022-04-18 ENCOUNTER — Other Ambulatory Visit: Payer: Medicaid Other

## 2022-06-06 ENCOUNTER — Encounter (HOSPITAL_COMMUNITY): Payer: Self-pay

## 2022-06-06 ENCOUNTER — Ambulatory Visit (HOSPITAL_COMMUNITY)
Admission: EM | Admit: 2022-06-06 | Discharge: 2022-06-06 | Disposition: A | Discharge status: Home / Self Care | Payer: Medicaid Other | Attending: Nurse Practitioner | Admitting: Nurse Practitioner

## 2022-06-06 ENCOUNTER — Ambulatory Visit (INDEPENDENT_AMBULATORY_CARE_PROVIDER_SITE_OTHER): Payer: Medicaid Other

## 2022-06-06 DIAGNOSIS — R112 Nausea with vomiting, unspecified: Secondary | ICD-10-CM

## 2022-06-06 DIAGNOSIS — R1084 Generalized abdominal pain: Secondary | ICD-10-CM | POA: Diagnosis present

## 2022-06-06 DIAGNOSIS — R030 Elevated blood-pressure reading, without diagnosis of hypertension: Secondary | ICD-10-CM

## 2022-06-06 DIAGNOSIS — R197 Diarrhea, unspecified: Secondary | ICD-10-CM

## 2022-06-06 LAB — CBC WITH DIFFERENTIAL/PLATELET
Abs Immature Granulocytes: 0.04 10*3/uL (ref 0.00–0.07)
Basophils Absolute: 0.1 10*3/uL (ref 0.0–0.1)
Basophils Relative: 1 %
Eosinophils Absolute: 0.5 10*3/uL (ref 0.0–0.5)
Eosinophils Relative: 4 %
HCT: 37.1 % (ref 36.0–46.0)
Hemoglobin: 12.6 g/dL (ref 12.0–15.0)
Immature Granulocytes: 0 %
Lymphocytes Relative: 46 %
Lymphs Abs: 5.6 10*3/uL — ABNORMAL HIGH (ref 0.7–4.0)
MCH: 27.6 pg (ref 26.0–34.0)
MCHC: 34 g/dL (ref 30.0–36.0)
MCV: 81.2 fL (ref 80.0–100.0)
Monocytes Absolute: 0.8 10*3/uL (ref 0.1–1.0)
Monocytes Relative: 7 %
Neutro Abs: 5.1 10*3/uL (ref 1.7–7.7)
Neutrophils Relative %: 42 %
Platelets: 263 10*3/uL (ref 150–400)
RBC: 4.57 MIL/uL (ref 3.87–5.11)
RDW: 13.5 % (ref 11.5–15.5)
WBC: 12.1 10*3/uL — ABNORMAL HIGH (ref 4.0–10.5)
nRBC: 0 % (ref 0.0–0.2)

## 2022-06-06 LAB — COMPREHENSIVE METABOLIC PANEL
ALT: 14 U/L (ref 0–44)
AST: 19 U/L (ref 15–41)
Albumin: 4 g/dL (ref 3.5–5.0)
Alkaline Phosphatase: 56 U/L (ref 38–126)
Anion gap: 11 (ref 5–15)
BUN: 8 mg/dL (ref 6–20)
CO2: 24 mmol/L (ref 22–32)
Calcium: 9.7 mg/dL (ref 8.9–10.3)
Chloride: 105 mmol/L (ref 98–111)
Creatinine, Ser: 0.76 mg/dL (ref 0.44–1.00)
GFR, Estimated: >60 mL/min (ref >60)
Glucose, Bld: 86 mg/dL (ref 70–99)
Potassium: 3.5 mmol/L (ref 3.5–5.1)
Sodium: 140 mmol/L (ref 135–145)
Total Bilirubin: 0.4 mg/dL (ref 0.3–1.2)
Total Protein: 7.7 g/dL (ref 6.5–8.1)

## 2022-06-06 LAB — LIPASE, BLOOD: Lipase: 96 U/L — ABNORMAL HIGH (ref 11–51)

## 2022-06-06 MED ORDER — PROMETHAZINE HCL 25 MG/ML IJ SOLN: 25.0000 mg | Freq: Once | INTRAMUSCULAR | Status: DC

## 2022-06-06 MED ORDER — DICYCLOMINE HCL 20 MG PO TABS: 20.0000 mg | ORAL_TABLET | Freq: Four times a day (QID) | ORAL | 0 refills | Status: DC | PRN

## 2022-06-06 NOTE — ED Provider Notes (Addendum)
[facility]    CSN: [id number] Arrival date & time: 06/06/22  1810      History   Chief Complaint Chief Complaint  Patient presents with   Abdominal Pain   Emesis    HPI Tanya Krause is a 37 y.o. female.   Subjective:   Tanya Krause is a 37 y.o. female with chronic abdominal pain, chronic pain syndrome, fibromyalgia and sickle cell trait with multiple medication allergies presents for evaluation of abdominal pain. The pain is described as aching, dull, hot, pressure-like, and sharp, and is 10/10 in intensity. Pain is located in the diffusely without radiation. Onset was 8 days ago. Symptoms have been unchanged since. Aggravating factors include: activity, eating, movement, pressure, recumbency, and sitting up. She has tried her prescribed pain medications, phenergan, Zofran and tylenol without any relief in symptoms. She is on prescribed Protonix daily for GERD. Associated symptoms include anorexia, bloating, weakness, belching, diarrhea, flatus, headache, myalgias, nausea, and vomiting. She doesn't get her menses due to history of completed hysterectomy. The patient denies constipation, dysuria, fever, hematochezia, hematuria, and melena.  The following portions of the patient's history were reviewed and updated as appropriate: allergies, current medications, past family history, past medical history, past social history, past surgical history, and problem list.         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 04/08/2019   Trochanteric bursitis of left hip 03/11/2019   S/P hysterectomy 02/06/2019   S/P TAH-BSO (total abdominal hysterectomy and bilateral salpingo-oophorectomy) 02/06/2019   Myofascial pain dysfunction syndrome 09/27/2018   Chronic bilateral back pain 09/03/2018   Colitis 08/12/2015   Oral thrush 11/18/2014   B12 deficiency 11/17/2014   Chronic pain syndrome 11/17/2014   Numbness 11/17/2014   Intractable headache 11/17/2014   Intractable chronic migraine without aura and without status migrainosus 11/17/2014   Complicated migraine, intractable 10/11/2014   Primary fibromyalgia syndrome 10/11/2014   Lower extremity weakness 10/11/2014   Vitamin B12 deficiency 10/11/2014   Anxiety state 10/11/2014   Nausea & vomiting 10/08/2014   Headache, tension type, chronic 09/07/2012   Abdominal pain 06/15/2012    Past Surgical History:  Procedure Laterality Date   HYSTERECTOMY ABDOMINAL WITH SALPINGO-OOPHORECTOMY Bilateral 02/06/2019   Procedure: HYSTERECTOMY ABDOMINAL WITH BILATERAL SALPINGO-OOPHORECTOMY;  Surgeon: Lazaro Arms, MD;  Location: [facility];  Service: Gynecology;  Laterality: Bilateral;   kidney infections     TUBAL LIGATION     WISDOM TOOTH EXTRACTION      OB History     Gravida  4   Para  4   Term  1   Preterm  3   AB      Living  4      SAB      IAB      Ectopic      Multiple  Live Births  4            Home Medications      Family History Family History  Problem Relation Age of Onset   Hypertension Mother    Diabetes Mother    Heart failure Father    Colon cancer Paternal Grandfather    Healthy Daughter    Healthy Son    Healthy Son    Healthy Son     Social History Social History   Tobacco Use   Smoking status: Some Days    Packs/day: 0.14    Years: 5.00     Additional pack years: 0.00    Total pack years: 0.70    Types: Cigarettes   Smokeless tobacco: Never   Tobacco comments:    3 cig daily  Vaping Use   Vaping Use: Former   Substances: CBD  Substance Use Topics   Alcohol use: Not Currently    Comment: occ   Drug use: Not Currently    Types: Marijuana    Comment: last used september 2020     Allergies   Ketorolac, Metoclopramide, Other, Tomato, Hydromorphone hcl, Ondansetron, Shellfish allergy, Toradol [ketorolac tromethamine], Aspirin, Doxycycline, Hydromorphone, Hydromorphone hcl, Ibuprofen, Latex, Shellfish-derived products, and Tramadol   Review of Systems Review of Systems  Constitutional:  Positive for activity change, appetite change and fatigue. Negative for fever.  Gastrointestinal:  Positive for abdominal distention, abdominal pain, diarrhea, nausea and vomiting. Negative for anal bleeding, blood in stool, constipation and rectal pain.  Genitourinary:  Negative for dysuria.  Neurological:  Positive for headaches.  All other systems reviewed and are negative.    Physical Exam Triage Vital Signs ED Triage Vitals  Enc Vitals Group     BP 06/06/22 1846 (!) 175/136     Pulse Rate 06/06/22 1846 92     Resp 06/06/22 1846 18     Temp 06/06/22 1846 98.2 F (36.8 C)     Temp Source 06/06/22 1846 Oral     SpO2 06/06/22 1846 97 %     Weight --      Height --      Head Circumference --      Peak Flow --      Pain Score 06/06/22 1848 10     Pain Loc --      Pain Edu? --      Excl. in GC? --    No data found.  Updated Vital Signs BP (!) 175/136 (BP Location: Right Arm)   Pulse 92   Temp 98.2 F (36.8 C) (Oral)   Resp 18   LMP 12/04/2018   SpO2 97%   Visual Acuity Right Eye Distance:   Left Eye Distance:   Bilateral Distance:    Right Eye Near:   Left Eye Near:    Bilateral Near:     Physical Exam Vitals reviewed.  Constitutional:      General: She is not in acute distress.    Appearance: She is  well-developed. She is not ill-appearing or toxic-appearing.  HENT:     Head: Normocephalic.     Mouth/Throat:     Mouth: Mucous membranes are moist.  Cardiovascular:     Rate and Rhythm: Normal rate.  Pulmonary:     Effort: Pulmonary effort is normal.  Abdominal:     General: Bowel sounds are normal. There is distension.     Palpations: Abdomen is soft.     Tenderness: There is generalized abdominal tenderness. There is no  right CVA tenderness, left CVA tenderness, guarding or rebound.  Skin:    General: Skin is warm and dry.  Neurological:     General: No focal deficit present.     Mental Status: She is alert and oriented to person, place, and time.      UC Treatments / Results  Labs (all labs ordered are listed, but only abnormal results are displayed) Labs Reviewed  CBC WITH DIFFERENTIAL/PLATELET  COMPREHENSIVE METABOLIC PANEL  LIPASE, BLOOD    EKG   Radiology DG Abd 1 View  Result Date: 06/06/2022 CLINICAL DATA:  Abdominal pain, bloating, distention for 2 weeks EXAM: ABDOMEN - 1 VIEW COMPARISON:  12/05/2014 FINDINGS: 2 supine frontal views of the abdomen and pelvis are obtained. No bowel obstruction or ileus. Minimal stool within the proximal colon. No masses or abnormal calcifications. No acute bony abnormalities. IMPRESSION: 1. Unremarkable bowel gas pattern. Electronically Signed   By: Sharlet Salina M.D.   On: 06/06/2022 19:46    Procedures Procedures (including critical care time)  Medications Ordered in UC Medications - No data to display  Initial Impression / Assessment and Plan / UC Course  I have reviewed the triage vital signs and the nursing notes.  Pertinent labs & imaging results that were available during my care of the patient were reviewed by me and considered in my medical decision making (see chart for details).    37 y.o. female with chronic abdominal pain, chronic pain syndrome, fibromyalgia and sickle cell trait with multiple medication  allergies presents for evaluation of abdominal pain, anorexia, bloating, weakness, belching, diarrhea, flatus, headache, myalgias, nausea, and vomiting.  Patient is afebrile and nontoxic.  BP elevated but no evidence of hypertensive urgency or hypertensive emergency.  Zickel exam as above.  CBC, CMP and lipase pending.  Abdominal x-ray shows no bowel obstruction or ileus.  Etiology of patient's complaint is unknown at this time but does not seem to be due to a life-threatening cause at this time.  Patient has multiple allergies and has tried everything that she is able to take.  Will prescribe a trial of Bentyl for abdominal pain.  Clear liquids.  Bland diet as tolerated.  GI follow-up.  ED precautions extensively reviewed.  Today's evaluation has revealed no signs of a dangerous process. Discussed diagnosis with patient and/or guardian. Patient and/or guardian aware of their diagnosis, possible red flag symptoms to watch out for and need for close follow up. Patient and/or guardian understands verbal and written discharge instructions. Patient and/or guardian comfortable with plan and disposition.  Patient and/or guardian has a clear mental status at this time, good insight into illness (after discussion and teaching) and has clear judgment to make decisions regarding their care  Documentation was completed with the aid of voice recognition software. Transcription may contain typographical errors. Final Clinical Impressions(s) / UC Diagnoses   Final diagnoses:  Generalized abdominal pain  Nausea vomiting and diarrhea  Elevated blood pressure reading in office without diagnosis of hypertension     Discharge Instructions      You have been seen today for abdominal pain. The cause is not yet known. The XR of your abdominal did not show anything abnormal. Blood work has been done and the results are pending. You will only be notified if anything is abnormal. You can check MyChart for your results.    Drink plenty of fluids to keep your urine a pale yellow color.  Eat a bland diet as tolerated.  Avoid dairy, spicy,  fried and greasy foods.  Monitor your symptoms very closely.  Follow-up with GI. Call in the morning to make an appointment   Go to the ED immediately if:  You cannot stop vomiting. Your pain is only in areas of your belly, such as the right side or the left lower part of the belly. You have bloody or black poop, or poop that looks like tar. You have very bad pain, cramping, or bloating in your belly. You have signs of not having enough fluid or water in your body  You have trouble breathing or chest pain.  Your blood pressure was elevated today. Managing hypertension is very important. Over time, hypertension can damage the arteries and decrease blood flow to parts of the body, including the brain, heart, and kidneys. Having untreated or uncontrolled hypertension can lead to heart attack, stroke, weakened blood vessels (aneurysm), heart failure, kidney damage, eye damage, memory/concentration problems and vascular dementia. Please monitor your blood pressure closely. Go to the ED immediately if you develop a severe headache, dizziness, sudden vision problems, confusion, experience unusual weakness or numbness, severe pain in your chest or abdomen, you vomit repeatedly or have trouble breathing.       ED Prescriptions     Medication Sig Dispense Auth. Provider   dicyclomine (BENTYL) 20 MG tablet Take 1 tablet (20 mg total) by mouth 4 (four) times daily as needed (abdominal pain). 20 tablet Lurline Idol, FNP      PDMP not reviewed this encounter.   Lurline Idol, FNP 06/06/22 2006    Lurline Idol, [place] 06/06/22 2008

## 2022-06-06 NOTE — ED Triage Notes (Signed)
Pt c/o n/v/d, abdominal pain with bloating and distention x2 wks. States unable to eat, sleep, or drink anything. States took her pain meds and tylenol with no relief. States her b/p 151/108 and 148/125.

## 2022-06-06 NOTE — Discharge Instructions (Addendum)
You have been seen today for abdominal pain. The cause is not yet known. The XR of your abdominal did not show anything abnormal. Blood work has been done and the results are pending. You will only be notified if anything is abnormal. You can check MyChart for your results.   Drink plenty of fluids to keep your urine a pale yellow color.  Eat a bland diet as tolerated.  Avoid dairy, spicy, fried and greasy foods.  Monitor your symptoms very closely.  Follow-up with GI. Call in the morning to make an appointment   Go to the ED immediately if:  You cannot stop vomiting. Your pain is only in areas of your belly, such as the right side or the left lower part of the belly. You have bloody or black poop, or poop that looks like tar. You have very bad pain, cramping, or bloating in your belly. You have signs of not having enough fluid or water in your body  You have trouble breathing or chest pain.  Your blood pressure was elevated today. Managing hypertension is very important. Over time, hypertension can damage the arteries and decrease blood flow to parts of the body, including the brain, heart, and kidneys. Having untreated or uncontrolled hypertension can lead to heart attack, stroke, weakened blood vessels (aneurysm), heart failure, kidney damage, eye damage, memory/concentration problems and vascular dementia. Please monitor your blood pressure closely. Go to the ED immediately if you develop a severe headache, dizziness, sudden vision problems, confusion, experience unusual weakness or numbness, severe pain in your chest or abdomen, you vomit repeatedly or have trouble breathing.

## 2022-06-07 ENCOUNTER — Emergency Department (HOSPITAL_COMMUNITY)
Admission: EM | Admit: 2022-06-07 | Discharge: 2022-06-07 | Discharge status: Against Medical Advice | Payer: Medicaid Other | Attending: Emergency Medicine | Admitting: Emergency Medicine

## 2022-06-07 ENCOUNTER — Emergency Department (HOSPITAL_COMMUNITY): Payer: Medicaid Other

## 2022-06-07 ENCOUNTER — Telehealth (HOSPITAL_COMMUNITY): Payer: Self-pay | Admitting: Emergency Medicine

## 2022-06-07 ENCOUNTER — Encounter (HOSPITAL_COMMUNITY): Payer: Self-pay

## 2022-06-07 ENCOUNTER — Other Ambulatory Visit: Payer: Self-pay

## 2022-06-07 DIAGNOSIS — E876 Hypokalemia: Secondary | ICD-10-CM | POA: Diagnosis not present

## 2022-06-07 DIAGNOSIS — J45909 Unspecified asthma, uncomplicated: Secondary | ICD-10-CM | POA: Diagnosis not present

## 2022-06-07 DIAGNOSIS — R110 Nausea: Secondary | ICD-10-CM

## 2022-06-07 DIAGNOSIS — R112 Nausea with vomiting, unspecified: Secondary | ICD-10-CM | POA: Insufficient documentation

## 2022-06-07 DIAGNOSIS — I10 Essential (primary) hypertension: Secondary | ICD-10-CM | POA: Diagnosis not present

## 2022-06-07 DIAGNOSIS — Z79899 Other long term (current) drug therapy: Secondary | ICD-10-CM | POA: Diagnosis not present

## 2022-06-07 DIAGNOSIS — F1290 Cannabis use, unspecified, uncomplicated: Secondary | ICD-10-CM | POA: Diagnosis not present

## 2022-06-07 DIAGNOSIS — R1084 Generalized abdominal pain: Secondary | ICD-10-CM | POA: Diagnosis not present

## 2022-06-07 DIAGNOSIS — Z91040 Latex allergy status: Secondary | ICD-10-CM | POA: Diagnosis not present

## 2022-06-07 DIAGNOSIS — R1010 Upper abdominal pain, unspecified: Secondary | ICD-10-CM | POA: Diagnosis present

## 2022-06-07 LAB — RAPID URINE DRUG SCREEN, HOSP PERFORMED
Amphetamines: NOT DETECTED
Barbiturates: NOT DETECTED
Benzodiazepines: NOT DETECTED
Cocaine: NOT DETECTED
Opiates: NOT DETECTED
Tetrahydrocannabinol: POSITIVE — AB

## 2022-06-07 LAB — COMPREHENSIVE METABOLIC PANEL
ALT: 14 U/L (ref 0–44)
AST: 15 U/L (ref 15–41)
Albumin: 4.1 g/dL (ref 3.5–5.0)
Alkaline Phosphatase: 55 U/L (ref 38–126)
Anion gap: 8 (ref 5–15)
BUN: 13 mg/dL (ref 6–20)
CO2: 25 mmol/L (ref 22–32)
Calcium: 8.9 mg/dL (ref 8.9–10.3)
Chloride: 106 mmol/L (ref 98–111)
Creatinine, Ser: 0.82 mg/dL (ref 0.44–1.00)
GFR, Estimated: >60 mL/min (ref >60)
Glucose, Bld: 84 mg/dL (ref 70–99)
Potassium: 3.3 mmol/L — ABNORMAL LOW (ref 3.5–5.1)
Sodium: 139 mmol/L (ref 135–145)
Total Bilirubin: 0.4 mg/dL (ref 0.3–1.2)
Total Protein: 7.6 g/dL (ref 6.5–8.1)

## 2022-06-07 LAB — LACTIC ACID, PLASMA
Lactic Acid, Venous: 0.7 mmol/L (ref 0.5–1.9)
Lactic Acid, Venous: 1.2 mmol/L (ref 0.5–1.9)

## 2022-06-07 LAB — CBC WITH DIFFERENTIAL/PLATELET
Abs Immature Granulocytes: 0.02 10*3/uL (ref 0.00–0.07)
Basophils Absolute: 0.1 10*3/uL (ref 0.0–0.1)
Basophils Relative: 1 %
Eosinophils Absolute: 0.4 10*3/uL (ref 0.0–0.5)
Eosinophils Relative: 4 %
HCT: 36.5 % (ref 36.0–46.0)
Hemoglobin: 12.4 g/dL (ref 12.0–15.0)
Immature Granulocytes: 0 %
Lymphocytes Relative: 57 %
Lymphs Abs: 5.6 10*3/uL — ABNORMAL HIGH (ref 0.7–4.0)
MCH: 28.4 pg (ref 26.0–34.0)
MCHC: 34 g/dL (ref 30.0–36.0)
MCV: 83.5 fL (ref 80.0–100.0)
Monocytes Absolute: 0.6 10*3/uL (ref 0.1–1.0)
Monocytes Relative: 6 %
Neutro Abs: 3.2 10*3/uL (ref 1.7–7.7)
Neutrophils Relative %: 32 %
Platelets: 265 10*3/uL (ref 150–400)
RBC: 4.37 MIL/uL (ref 3.87–5.11)
RDW: 13.5 % (ref 11.5–15.5)
WBC: 9.9 10*3/uL (ref 4.0–10.5)
nRBC: 0 % (ref 0.0–0.2)

## 2022-06-07 LAB — LIPASE, BLOOD: Lipase: 46 U/L (ref 11–51)

## 2022-06-07 MED ORDER — PROCHLORPERAZINE EDISYLATE 10 MG/2ML IJ SOLN
10.0000 mg | Freq: Once | INTRAMUSCULAR | Status: AC
  Administered 2022-06-07: 10 mg via INTRAVENOUS
  Filled 2022-06-07 (×2): qty 2

## 2022-06-07 MED ORDER — POTASSIUM CHLORIDE CRYS ER 20 MEQ PO TBCR
40.0000 meq | EXTENDED_RELEASE_TABLET | Freq: Once | ORAL | Status: AC
  Administered 2022-06-07: 40 meq via ORAL
  Filled 2022-06-07: qty 2

## 2022-06-07 MED ORDER — HYDROXYZINE HCL 25 MG PO TABS: 25.0000 mg | ORAL_TABLET | Freq: Once | ORAL | Status: DC

## 2022-06-07 MED ORDER — FENTANYL CITRATE PF 50 MCG/ML IJ SOSY
50.0000 ug | PREFILLED_SYRINGE | Freq: Once | INTRAMUSCULAR | Status: AC
  Administered 2022-06-07: 50 ug via INTRAVENOUS
  Filled 2022-06-07: qty 1

## 2022-06-07 MED ORDER — DIPHENHYDRAMINE HCL 50 MG/ML IJ SOLN
25.0000 mg | Freq: Once | INTRAMUSCULAR | Status: AC
  Administered 2022-06-07: 25 mg via INTRAVENOUS
  Filled 2022-06-07: qty 1

## 2022-06-07 MED ORDER — FENTANYL CITRATE PF 50 MCG/ML IJ SOSY
50.0000 ug | PREFILLED_SYRINGE | Freq: Once | INTRAMUSCULAR | Status: AC
  Administered 2022-06-07: 50 ug via INTRAVENOUS
  Filled 2022-06-07 (×2): qty 1

## 2022-06-07 MED ORDER — SODIUM CHLORIDE 0.9 % IV BOLUS
1000.0000 mL | Freq: Once | INTRAVENOUS | Status: AC
  Administered 2022-06-07: 1000 mL via INTRAVENOUS

## 2022-06-07 MED ORDER — IOHEXOL 300 MG/ML  SOLN
100.0000 mL | Freq: Once | INTRAMUSCULAR | Status: AC | PRN
  Administered 2022-06-07: 100 mL via INTRAVENOUS

## 2022-06-07 NOTE — ED Provider Notes (Signed)
Signed out by Dr Rubin Payor to d/c to home if/when CT resulted and no emergent process noted.   CT neg acute.   Abd soft nt. No recurrent emesis. Pt comfortable appearing.   Lipase is normal. THC+, ?possible cannabinoid hyperemesis syndrome.   Pt currently appears stable for d/c.   Return precautions provided.      Cathren Laine, MD 06/07/22 1710

## 2022-06-07 NOTE — ED Provider Notes (Signed)
[place] EMERGENCY DEPARTMENT AT [facility] Provider Note   CSN: [id number] Arrival date & time: 06/07/22  1222     History  Chief Complaint  Patient presents with   Abdominal Pain    Tanya Krause is a 37 y.o. female.   Abdominal Pain Patient presents abdominal pain nausea and vomiting with diarrhea.  Pain in upper abdomen going back.  States she cannot keep anything down.  States it is different than her previous chronic abdominal pain.  Seen at urgent care yesterday and had a lipase that was elevated to around 100.  Patient does not drink alcohol and states she still has her gallbladder.  No known sick contacts.  Does have numerous allergies but states she can take Dilaudid as long as she gets Benadryl with it.  Pain does go to the back on the left side.    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   Hypertension    Migraine    Pelvic inflammatory disease (PID)     Home Medications Prior to Admission medications   Medication Sig Start Date End Date Taking? Authorizing Provider  acetaminophen (TYLENOL) 500 MG tablet Take 1,000 mg by mouth every 6 (six) hours as needed for moderate pain.    [provider]  acetaminophen-codeine (TYLENOL #3) 300-30 MG tablet 1 tablet nightly as needed. 06/22/21   [provider]  albuterol (PROVENTIL) (2.5 MG/3ML) 0.083% nebulizer solution 2.5 mg. 06/20/20   [provider]  albuterol (VENTOLIN HFA) 108 (90 Base) MCG/ACT inhaler Inhale into the lungs. 01/19/12   [provider]  amLODipine (NORVASC) 10 MG tablet Take 1 tablet by mouth daily. 07/29/20   [provider]  botulinum toxin Type A (BOTOX) 100 units SOLR injection Inject into the muscle. 12/03/20   [provider]  brexpiprazole (REXULTI) 1 MG TABS tablet Take 1 tablet by mouth daily.  11/19/20   [provider]  busPIRone (BUSPAR) 10 MG tablet Take 10 mg by mouth 3 (three) times daily. 09/23/20   [provider]  cyclobenzaprine (FLEXERIL) 10 MG tablet Take by mouth. 12/21/20   [provider]  diclofenac (VOLTAREN) 50 MG EC tablet Take 1 tablet (50 mg total) by mouth 2 (two) times daily as needed (for swelling of hands/tendinitis). 03/20/20   Lovorn, Aundra Millet, MD  dicyclomine (BENTYL) 20 MG tablet Take 1 tablet (20 mg total) by mouth 4 (four) times daily as needed (abdominal pain). 06/06/22   Lurline Idol, FNP  diphenhydrAMINE (BENADRYL) 25 MG tablet Take by mouth. 01/25/21   [provider]  DULoxetine (CYMBALTA) 60 MG capsule Take 60 mg by mouth 2 (two) times daily. 04/08/22   [provider]  EMGALITY 120 MG/ML SOAJ Inject 1 each into the skin every 30 (thirty) days. No matter what- to decrease migraines- 02/11/22   Lovorn, Aundra Millet, MD  FLUoxetine (PROZAC) 20 MG capsule Take 60 mg by mouth every morning. 07/29/20   [provider]  FLUoxetine (PROZAC) 40 MG capsule Take 80 mg by mouth every morning. 09/23/20   [provider]  fluticasone Aleda Grana) 50 MCG/ACT nasal spray  04/30/21   [provider]  fluticasone (FLOVENT HFA) 220 MCG/ACT inhaler Inhale into the lungs. 11/07/20   [provider]  folic acid (FOLVITE) 800 MCG tablet Take by  mouth. 01/25/21   [provider]  gabapentin (NEURONTIN) 300 MG capsule Take 1 capsule (300 mg total) by mouth 3 (three) times daily. X 1 week, then can increase to 600 mg 3x/day- for nerve pain/Fibromyalgia 04/11/22   Lovorn, Aundra Millet, MD  hydrochlorothiazide (HYDRODIURIL) 25 MG tablet Take 1 tablet by mouth daily. 12/28/20   [provider]  HYDROcodone-acetaminophen (NORCO/VICODIN) 5-325 MG tablet Take 1 tablet by mouth 4 (four) times daily as needed. 01/26/22   [provider]  hydrOXYzine (ATARAX/VISTARIL) 25 MG tablet Take 1 tablet (25 mg total) by  mouth every 6 (six) hours as needed for anxiety. 08/27/20   Dartha Lodge, PA-C  lidocaine (LIDODERM) 5 %  04/29/21   [provider]  meloxicam (MOBIC) 15 MG tablet Take by mouth. 04/29/21   [provider]  naproxen (NAPROSYN) 500 MG tablet Take 1 tablet (500 mg total) by mouth 2 (two) times daily with a meal. 05/12/21   Lovorn, Aundra Millet, MD  nystatin (MYCOSTATIN) 100000 UNIT/ML suspension TAKE ONE TEASPOONFUL BY MOUTH FOUR TIMES A DAY 04/09/21   Lovorn, Megan, MD  Oxcarbazepine (TRILEPTAL) 300 MG tablet Take by mouth. 01/25/21   [provider]  pantoprazole (PROTONIX) 40 MG tablet Take by mouth. 12/28/20   [provider]  promethazine (PHENERGAN) 25 MG tablet Take 1 tablet (25 mg total) by mouth every 8 (eight) hours as needed for nausea or vomiting. 02/11/22   Lovorn, Aundra Millet, MD  promethazine (PHENERGAN) 6.25 MG/5ML syrup Take by mouth. 01/12/21   [provider]  QUEtiapine (SEROQUEL) 25 MG tablet Take 25 mg by mouth at bedtime. 01/13/22   [provider]  QUEtiapine (SEROQUEL) 50 MG tablet Take 1-2 tablets (50-100 mg total) by mouth at bedtime as needed (sleep). Patient not taking: Reported on 04/11/2022 02/11/22   Lovorn, Aundra Millet, MD  REXULTI 0.5 MG TABS Take 1 tablet by mouth every evening. 09/24/20   [provider]  REXULTI 1 MG TABS tablet Take 1 mg by mouth daily. 10/22/20   [provider]  Rimegepant Sulfate (NURTEC) 75 MG TBDP Take by mouth. 10/23/20   [provider]  SUMAtriptan (IMITREX) 100 MG tablet TAKE 1 TABLET (100 MG TOTAL) BY MOUTH EVERY 2 (TWO) HOURS AS NEEDED FOR MIGRAINE **MAY REPEAT IN 2 HOURS IF HEADACHE PERSISTS OR RECURS. 10/23/20   Lovorn, Aundra Millet, MD  triamcinolone (KENALOG) 0.025 % cream Apply 1 application  topically 2 (two) times daily. 11/16/20   [provider]  venlafaxine (EFFEXOR) 37.5 MG tablet Take 37.5 mg by mouth every morning. 02/20/20   [provider]  Vitamin D,  Ergocalciferol, (DRISDOL) 1.25 MG (50000 UNIT) CAPS capsule Take 50,000 Units by mouth once a week. 04/08/22   [provider]  zolpidem (AMBIEN) 10 MG tablet Take 10 mg by mouth at bedtime. Patient not taking: Reported on 04/11/2022 04/08/22   [provider]  zolpidem Remus Loffler) 5 MG tablet  03/31/21   [provider]      Allergies    Ketorolac, Metoclopramide, Other, Tomato, Compazine [prochlorperazine], Hydromorphone hcl, Ondansetron, Shellfish allergy, Toradol [ketorolac tromethamine], Aspirin, Doxycycline, Hydromorphone, Hydromorphone hcl, Ibuprofen, Latex, Shellfish-derived products, and Tramadol    Review of Systems   Review of Systems  Gastrointestinal:  Positive for abdominal pain.    Physical Exam Updated Vital Signs BP (!) 154/105   Pulse 96   Temp 98.3 F (36.8 C) (Oral)   Resp 18   Ht 5' (1.524 m)   Wt 83.5 kg  LMP 12/04/2018   SpO2 100%   BMI 35.94 kg/m  Physical Exam Vitals and nursing note reviewed.  Pulmonary:     Effort: Pulmonary effort is normal.  Abdominal:     Tenderness: There is abdominal tenderness.     Comments: Diffuse tenderness worse left side particular left upper quadrant.  No hernia palpated.  Skin:    General: Skin is warm.  Neurological:     Mental Status: She is alert and oriented to person, place, and time.     ED Results / Procedures / Treatments   Labs (all labs ordered are listed, but only abnormal results are displayed) Labs Reviewed  COMPREHENSIVE METABOLIC PANEL - Abnormal; Notable for the following components:      Result Value   Potassium 3.3 (*)    All other components within normal limits  CBC WITH DIFFERENTIAL/PLATELET - Abnormal; Notable for the following components:   Lymphs Abs 5.6 (*)    All other components within normal limits  LACTIC ACID, PLASMA  LACTIC ACID, PLASMA  LIPASE, BLOOD    EKG None  Radiology DG Abd 1 View  Result Date: 06/06/2022 CLINICAL DATA:  Abdominal pain,  bloating, distention for 2 weeks EXAM: ABDOMEN - 1 VIEW COMPARISON:  12/05/2014 FINDINGS: 2 supine frontal views of the abdomen and pelvis are obtained. No bowel obstruction or ileus. Minimal stool within the proximal colon. No masses or abnormal calcifications. No acute bony abnormalities. IMPRESSION: 1. Unremarkable bowel gas pattern. Electronically Signed   By: Sharlet Salina M.D.   On: 06/06/2022 19:46    Procedures Procedures    Medications Ordered in ED Medications  sodium chloride 0.9 % bolus 1,000 mL (1,000 mLs Intravenous New Bag/Given 06/07/22 1434)  prochlorperazine (COMPAZINE) injection 10 mg (10 mg Intravenous Given 06/07/22 1435)  fentaNYL (SUBLIMAZE) injection 50 mcg (50 mcg Intravenous Given 06/07/22 1435)  fentaNYL (SUBLIMAZE) injection 50 mcg (50 mcg Intravenous Given 06/07/22 1517)  diphenhydrAMINE (BENADRYL) injection 25 mg (25 mg Intravenous Given 06/07/22 1511)    ED Course/ Medical Decision Making/ A&P                             Medical Decision Making Amount and/or Complexity of Data Reviewed Labs: ordered. Radiology: ordered.  Risk Prescription drug management.   Patient onset nausea vomiting diarrhea.  Reviewed some previous notes.  Has had constant abdominal pain.  Reviewed previous CT scan that has had colitis in the past.  However lipase from urgent care was elevated.  Will repeat blood work.  Will give fluids antiemetics and a small amount of fentanyl for now.  May need to repeat CT scan.  Last CT scan was around 2 years ago.  CBC and CMP reassuring.  Lipase still pending.  CT scan ordered.  Had reportedly had more pain but feeling jittery after Compazine.  Will give some Benadryl.  Care turned over to Dr.Steinl        Final Clinical Impression(s) / ED Diagnoses Final diagnoses:  None    Rx / DC Orders ED Discharge Orders     None         Benjiman Core, MD 06/07/22 [phone]

## 2022-06-07 NOTE — ED Triage Notes (Signed)
Generalized abdominal pain, nausea, vomiting, diarrhea for 2 weeks. Seen at [facility] yesterday and UC called her today to tell her to be evaluated in the ER due to concern for pancreatitis.

## 2022-06-07 NOTE — ED Notes (Signed)
Pt reports feeling "jittery and anxious" after Fentanyl and Compazine

## 2022-06-07 NOTE — Discharge Instructions (Addendum)
It was our pleasure to provide your ER care today - we hope that you feel better.  Your CT was read by our radiologist as looking good - no acute abnormality was noted.   From today's labs, your potassium level is mildly low - eat plenty of fruits and vegetables, and follow up with your doctor.  Drink plenty of fluids/stay well hydrated. Take acetaminophen as need.   Your blood pressure is high today - take your meds as prescribed, limit salt intake, and follow up closely with primary care doctor in 1-2 weeks.   Note that increasingly we are seeing a recurrent abdominal pain and/or vomiting syndrome called Cannabinoid Hyperemesis Syndrome - see attached info - in these cases avoiding marijuana use will prevent symptoms from recurrent (note that symptoms can persist for a few weeks if history of heavy marijuana use as it can take time to get out of system).   Return to ER right away  if worse, fevers, new symptoms, new or worsening or severe abdominal pain, persistent vomiting, chest pain, trouble breathing, or other emergency concern.  You were given pain meds in the ER - no driving for the next 6 hours.

## 2022-06-07 NOTE — Telephone Encounter (Signed)
Patient is being sent post-discharge from the Urgent Care and sent to the Emergency Department via private vehicle . Per Lelon Mast, APP, patient is in need of higher level of care due to elevated lipase and "complicated medical history with multiple allergies.  She is going to be difficult to manage as an outpatient". Patient is aware and verbalizes understanding of plan of care.

## 2022-06-07 NOTE — ED Notes (Signed)
Patient transported to CT 

## 2022-06-15 ENCOUNTER — Encounter: Payer: Medicaid Other | Admitting: Physical Medicine and Rehabilitation

## 2022-07-27 ENCOUNTER — Encounter: Payer: Self-pay | Admitting: Physical Medicine and Rehabilitation

## 2022-07-27 ENCOUNTER — Encounter
Payer: Medicaid Other | Attending: Physical Medicine and Rehabilitation | Admitting: Physical Medicine and Rehabilitation

## 2022-07-27 VITALS — BP 148/96 | HR 99 | Ht 60.0 in | Wt 170.0 lb

## 2022-07-27 DIAGNOSIS — M797 Fibromyalgia: Secondary | ICD-10-CM | POA: Insufficient documentation

## 2022-07-27 DIAGNOSIS — M7918 Myalgia, other site: Secondary | ICD-10-CM | POA: Insufficient documentation

## 2022-07-27 DIAGNOSIS — G43719 Chronic migraine without aura, intractable, without status migrainosus: Secondary | ICD-10-CM | POA: Diagnosis present

## 2022-07-27 DIAGNOSIS — R112 Nausea with vomiting, unspecified: Secondary | ICD-10-CM | POA: Insufficient documentation

## 2022-07-27 MED ORDER — PROMETHAZINE HCL 25 MG PO TABS: 25.0000 mg | ORAL_TABLET | Freq: Three times a day (TID) | ORAL | 5 refills | Status: DC | PRN

## 2022-07-27 MED ORDER — QUETIAPINE FUMARATE 50 MG PO TABS: 50.0000 mg | ORAL_TABLET | Freq: Every evening | ORAL | 5 refills | Status: AC | PRN

## 2022-07-27 MED ORDER — LIDOCAINE HCL 1 % IJ SOLN
6.0000 mL | Freq: Once | INTRAMUSCULAR | Status: AC
Start: 2022-07-27 — End: 2022-07-27
  Administered 2022-07-27: 6 mL

## 2022-07-27 MED ORDER — DULOXETINE HCL 60 MG PO CPEP: 60.0000 mg | ORAL_CAPSULE | Freq: Every day | ORAL | 5 refills | Status: DC

## 2022-07-27 MED ORDER — DICLOFENAC SODIUM 50 MG PO TBEC: 50.0000 mg | DELAYED_RELEASE_TABLET | Freq: Two times a day (BID) | ORAL | 5 refills | Status: DC | PRN

## 2022-07-27 MED ORDER — GABAPENTIN 600 MG PO TABS: 900.0000 mg | ORAL_TABLET | Freq: Three times a day (TID) | ORAL | 5 refills | Status: AC

## 2022-07-27 NOTE — Addendum Note (Signed)
Addended by: Silas Sacramento T on: 07/27/2022 10:21 AM   Modules accepted: Orders

## 2022-07-27 NOTE — Progress Notes (Signed)
Patient is is 37 yr old female with hx of chronic pain syndrome- with myofascial pain syndrome, fibromyalgia - Also has sickle cell disease. And chronic migraines- intractable.     Pt is here for f/u on FMS/myofascial pain and trigger point injections.   Sees Neurology 8/5- next month Has appointment, just waiting to see them.   Got daughter- not living with parents anymore.  Not living with wife.  2nd son, in [employer]- in [employer].   Didn't get BMP ordered in March- but checked in May- NA 140  IBS started back- real bad.  More constipation.   Aunt died, so missed appointment in May- and has appointment in August/September.    Pain about the same-  Diclofenac doesn't work- but when asked further, Doesn't take pain completely away.   Was taking Duloxetine- works wonders, but ran out.  Still taking Trileptal- thinks it ran out.   Was taking Duloxetine and Rexulti- but not sure who gave her Rexulti.     Plan: Allergic to NSAIDs- was giving Diclofenac but says works with taking benadryl will give again 50 mg 2x/day AS NEEDED- for joint pain and arthritic pain, NOT fibromyalgia pain.   2.  Has Rx for Emgality- had 11 refills on January- 2024   3. Will increase Gabapentin to 90 mg 3x/day- for nerve pain/fibromyalgia.    4. Restart Duloxetine 60 mg daily- for Fibromyalgia. Worked really well for her in past.   5.  Wait today on Trileptal/Oxcarbazeapine for today since ran out.   6.  Ask PCP Ledora Bottcher? to write for Rexulti, or whoever wrote for it-  because I don't write for it. \  7. Con't Quetiapine 50-100 mg nightly for sleep-    8. Sees Neurology in August, so they can take over Migraine medications/treatment  9. Restart Phenergan for nausea 25 mg q6 hours as needed #90- 5 refills- cannot get syrup anymore, so cannot prescribe.    10. Patient here for trigger point injections for  Consent done and on chart.  Cleaned areas with alcohol and injected using a  27 gauge 1.5 inch needle  Injected 9 cc- none wasted Using 1% Lidocaine with no EPI  Upper traps B/L  Levators- B/L  Posterior scalenes Middle scalenes- BL Splenius Capitus Pectoralis Major- B/L  Rhomboids B/L x2 Infraspinatus Teres Major/minor Thoracic paraspinals B/L  Lumbar paraspinals B/L x2 Other injections- B/L hands and feet   Patient's level of pain prior was 10/10 Current level of pain after injections is down to 9.5/10  There was no bleeding or complications.  Patient was advised to drink a lot of water on day after injections to flush system Will have increased soreness for 12-48 hours after injections.  Can use Lidocaine patches the day AFTER injections Can use theracane on day of injections in places didn't inject Can use heating pad 4-6 hours AFTER injections   11. Drink a lot of water today for me!  12. F/U in 6 weeks for Trigger point injections-   I spent a total of 36   minutes on total care today- >50% coordination of care- due to  8 minutes on injections- rest discussing optons for pain management and deciding what to prescribe.

## 2022-07-27 NOTE — Patient Instructions (Signed)
Plan: Allergic to NSAIDs- was giving Diclofenac but says works with taking benadryl will give again 50 mg 2x/day AS NEEDED- for joint pain and arthritic pain, NOT fibromyalgia pain.   2.  Has Rx for Emgality- had 11 refills on January- 2024   3. Will increase Gabapentin to 90 mg 3x/day- for nerve pain/fibromyalgia.    4. Restart Duloxetine 60 mg daily- for Fibromyalgia. Worked really well for her in past.   5.  Wait today on Trileptal/Oxcarbazeapine for today since ran out.   6.  Ask PCP Ledora Bottcher? to write for Rexulti, or whoever wrote for it-  because I don't write for it. \  7. Con't Quetiapine 50-100 mg nightly for sleep-    8. Sees Neurology in August, so they can take over Migraine medications/treatment  9. Restart Phenergan for nausea 25 mg q6 hours as needed #90- 5 refills- cannot get syrup anymore, so cannot prescribe.    10. Patient here for trigger point injections for  Consent done and on chart.  Cleaned areas with alcohol and injected using a 27 gauge 1.5 inch needle  Injected 9 cc- none wasted Using 1% Lidocaine with no EPI  Upper traps B/L  Levators- B/L  Posterior scalenes Middle scalenes- BL Splenius Capitus Pectoralis Major- B/L  Rhomboids B/L x2 Infraspinatus Teres Major/minor Thoracic paraspinals B/L  Lumbar paraspinals B/L x2 Other injections- B/L hands and feet   Patient's level of pain prior was 10/10 Current level of pain after injections is down to 9.5/10  There was no bleeding or complications.  Patient was advised to drink a lot of water on day after injections to flush system Will have increased soreness for 12-48 hours after injections.  Can use Lidocaine patches the day AFTER injections Can use theracane on day of injections in places didn't inject Can use heating pad 4-6 hours AFTER injections   11. Drink a lot of water today for me!  12. F/U in 6 weeks for Trigger point injections-

## 2022-08-09 ENCOUNTER — Telehealth: Payer: Self-pay | Admitting: *Deleted

## 2022-08-09 NOTE — Telephone Encounter (Signed)
Tanya Krause (Key: [id number]) PA Case ID #: [id number] Diclofenac

## 2022-08-10 NOTE — Telephone Encounter (Signed)
Tanya Krause (Key: [id number]) - [id number] Diclofenac Potassium 50MG  tablets Status: PA Response - Approved

## 2022-08-19 ENCOUNTER — Emergency Department (HOSPITAL_COMMUNITY): Payer: Medicaid Other

## 2022-08-19 ENCOUNTER — Emergency Department (HOSPITAL_COMMUNITY)
Admission: EM | Admit: 2022-08-19 | Discharge: 2022-08-19 | Disposition: A | Discharge status: Home / Self Care | Payer: Medicaid Other | Source: Home / Self Care | Attending: Emergency Medicine | Admitting: Emergency Medicine

## 2022-08-19 ENCOUNTER — Other Ambulatory Visit: Payer: Self-pay

## 2022-08-19 ENCOUNTER — Encounter (HOSPITAL_COMMUNITY): Payer: Self-pay

## 2022-08-19 DIAGNOSIS — Z91040 Latex allergy status: Secondary | ICD-10-CM | POA: Diagnosis not present

## 2022-08-19 DIAGNOSIS — R110 Nausea: Secondary | ICD-10-CM

## 2022-08-19 DIAGNOSIS — R519 Headache, unspecified: Secondary | ICD-10-CM | POA: Insufficient documentation

## 2022-08-19 DIAGNOSIS — R0602 Shortness of breath: Secondary | ICD-10-CM | POA: Diagnosis not present

## 2022-08-19 DIAGNOSIS — R42 Dizziness and giddiness: Secondary | ICD-10-CM | POA: Insufficient documentation

## 2022-08-19 DIAGNOSIS — R112 Nausea with vomiting, unspecified: Secondary | ICD-10-CM | POA: Diagnosis not present

## 2022-08-19 DIAGNOSIS — R0789 Other chest pain: Secondary | ICD-10-CM | POA: Insufficient documentation

## 2022-08-19 DIAGNOSIS — R000 Tachycardia, unspecified: Secondary | ICD-10-CM | POA: Insufficient documentation

## 2022-08-19 LAB — CBC
HCT: 37 % (ref 36.0–46.0)
Hemoglobin: 12.6 g/dL (ref 12.0–15.0)
MCH: 28 pg (ref 26.0–34.0)
MCHC: 34.1 g/dL (ref 30.0–36.0)
MCV: 82.2 fL (ref 80.0–100.0)
Platelets: 283 10*3/uL (ref 150–400)
RBC: 4.5 MIL/uL (ref 3.87–5.11)
RDW: 13.2 % (ref 11.5–15.5)
WBC: 8.9 10*3/uL (ref 4.0–10.5)
nRBC: 0 % (ref 0.0–0.2)

## 2022-08-19 LAB — TROPONIN I (HIGH SENSITIVITY): Troponin I (High Sensitivity): 2 ng/L (ref <18)

## 2022-08-19 LAB — HEPATIC FUNCTION PANEL
ALT: 18 U/L (ref 0–44)
AST: 17 U/L (ref 15–41)
Albumin: 4 g/dL (ref 3.5–5.0)
Alkaline Phosphatase: 60 U/L (ref 38–126)
Bilirubin, Direct: <0.1 mg/dL (ref 0.0–0.2)
Total Bilirubin: 0.5 mg/dL (ref 0.3–1.2)
Total Protein: 7.4 g/dL (ref 6.5–8.1)

## 2022-08-19 LAB — URINALYSIS, ROUTINE W REFLEX MICROSCOPIC
Bilirubin Urine: NEGATIVE
Glucose, UA: NEGATIVE mg/dL
Hgb urine dipstick: NEGATIVE
Ketones, ur: NEGATIVE mg/dL
Leukocytes,Ua: NEGATIVE
Nitrite: NEGATIVE
Protein, ur: NEGATIVE mg/dL
Specific Gravity, Urine: 1.013 (ref 1.005–1.030)
pH: 6 (ref 5.0–8.0)

## 2022-08-19 LAB — BASIC METABOLIC PANEL
Anion gap: 8 (ref 5–15)
BUN: 10 mg/dL (ref 6–20)
CO2: 26 mmol/L (ref 22–32)
Calcium: 9.3 mg/dL (ref 8.9–10.3)
Chloride: 102 mmol/L (ref 98–111)
Creatinine, Ser: 0.7 mg/dL (ref 0.44–1.00)
GFR, Estimated: >60 mL/min (ref >60)
Glucose, Bld: 90 mg/dL (ref 70–99)
Potassium: 4 mmol/L (ref 3.5–5.1)
Sodium: 136 mmol/L (ref 135–145)

## 2022-08-19 LAB — D-DIMER, QUANTITATIVE: D-Dimer, Quant: <0.27 ug/mL-FEU (ref 0.00–0.50)

## 2022-08-19 LAB — LIPASE, BLOOD: Lipase: 55 U/L — ABNORMAL HIGH (ref 11–51)

## 2022-08-19 MED ORDER — DROPERIDOL 2.5 MG/ML IJ SOLN
1.2500 mg | Freq: Once | INTRAMUSCULAR | Status: AC
  Administered 2022-08-19: 1.25 mg via INTRAVENOUS
  Filled 2022-08-19: qty 2

## 2022-08-19 MED ORDER — PROMETHAZINE HCL 25 MG RE SUPP: 25.0000 mg | Freq: Four times a day (QID) | RECTAL | 0 refills | Status: AC | PRN

## 2022-08-19 MED ORDER — ACETAMINOPHEN 325 MG PO TABS: 650.0000 mg | ORAL_TABLET | Freq: Once | ORAL | Status: DC

## 2022-08-19 MED ORDER — LACTATED RINGERS IV BOLUS
1000.0000 mL | Freq: Once | INTRAVENOUS | Status: AC
  Administered 2022-08-19: 1000 mL via INTRAVENOUS

## 2022-08-19 NOTE — ED Notes (Signed)
Patient transported to X-ray 

## 2022-08-19 NOTE — Discharge Instructions (Signed)
Follow up with your md next week.   Return if any problems

## 2022-08-19 NOTE — ED Triage Notes (Signed)
Per Ems, pt complaining of weakness, nausea/vomiting, headache, and generalized pain. This has been going on for apprx 3 wks, and progressively becoming worse. Hx of fibromyalgia, and sicklecell. EMS noted pt was tachycardic during their time with her.

## 2022-08-19 NOTE — ED Provider Notes (Signed)
[facility] Provider Note   CSN: [id number] Arrival date & time: 08/19/22  1212     History  Chief Complaint  Patient presents with   Fatigue        Nausea    DELMAR DONDERO is a 37 y.o. female.  HPI 37 year old female history for myalgia, chronic pain syndrome, hemoglobin C, hypertension, migraines presenting for multiple concerns.  Patient states for the last few weeks she has had intermittent episodes of dizziness, lightheadedness.  On Friday she had episode of syncope and did hit her head.  She also had some intermittent nausea, nonbloody bilious emesis.  She does have any shortness of breath though occasionally has some chest tightness.  No current abdominal pain.  No pleuritic pain.  She also has some pain over her back which she has had before but is worse today.  It is throughout her thoracic and lumbar spine and paraspinal muscles.  No radicular pain, no weakness or numbness.  She is able to walk without difficulty.  Some chills, no fevers.  No groin numbness or urinary retention.     Home Medications     Allergies    Ketorolac, Metoclopramide, Other, Tomato, Compazine [prochlorperazine], Hydromorphone hcl, Ondansetron, Shellfish allergy, Toradol [ketorolac tromethamine], Aspirin, Doxycycline, Hydromorphone, Hydromorphone hcl, Ibuprofen, Latex, Shellfish-derived products, and Tramadol    Review of Systems   Review of Systems Review of systems completed and notable as per HPI.  ROS otherwise negative.  Physical Exam Updated Vital Signs BP (!) 137/93   Pulse (!) 103   Temp 98.3 F (36.8 C) (Oral)   Resp 19   Ht 5' (1.524 m)   Wt 80.7 kg   LMP 12/04/2018   SpO2 100%   BMI 34.76 kg/m  Physical Exam Vitals and nursing note reviewed.  Constitutional:      General: She is not in acute distress.    Appearance: She is well-developed.  HENT:     Head: Normocephalic and atraumatic.     Nose: Nose normal.      Mouth/Throat:     Mouth: Mucous membranes are moist.     Pharynx: Oropharynx is clear.  Eyes:     Extraocular Movements: Extraocular movements intact.     Conjunctiva/sclera: Conjunctivae normal.     Pupils: Pupils are equal, round, and reactive to light.  Cardiovascular:     Rate and Rhythm: Regular rhythm. Tachycardia present.     Heart sounds: No murmur heard. Pulmonary:     Effort: Pulmonary effort is normal. No respiratory distress.     Breath sounds: Normal breath sounds.  Abdominal:     Palpations: Abdomen is soft.     Tenderness: There is no abdominal tenderness. There is no right CVA tenderness, left CVA tenderness, guarding or rebound.  Musculoskeletal:        General: No swelling.     Cervical back: Neck supple.     Right lower leg: No edema.     Left lower leg: No edema.     Comments: Patient has tenderness all over her back in her trapezius, paraspinal muscles.  No focal midline tenderness.  She is negative straight leg raise bilaterally.  She is able to ambulate without difficulty.  No rash or skin changes.  Skin:    General: Skin is warm and dry.     Capillary Refill: Capillary refill takes less than 2 seconds.  Neurological:     General: No focal deficit present.  Mental Status: She is alert and oriented to person, place, and time. Mental status is at baseline.     Cranial Nerves: No cranial nerve deficit.     Sensory: No sensory deficit.     Motor: No weakness.  Psychiatric:        Mood and Affect: Mood normal.     ED Results / Procedures / Treatments   Labs (all labs ordered are listed, but only abnormal results are displayed) Labs Reviewed  BASIC METABOLIC PANEL  CBC  D-DIMER, QUANTITATIVE  HEPATIC FUNCTION PANEL  URINALYSIS, ROUTINE W REFLEX MICROSCOPIC  LIPASE, BLOOD  TROPONIN I (HIGH SENSITIVITY)    EKG EKG Interpretation Date/Time:  Friday August 19 2022 16:55:47 EDT Ventricular Rate:  113 PR Interval:  147 QRS Duration:  96 QT  Interval:  363 QTC Calculation: 498 R Axis:   68  Text Interpretation: Sinus tachycardia Borderline prolonged QT interval Confirmed by Bethann Berkshire [phone]) on 08/19/2022 5:15:24 PM  Radiology CT Head Wo Contrast  Result Date: 08/19/2022 CLINICAL DATA:  Weakness, vomiting, headaches EXAM: CT HEAD WITHOUT CONTRAST TECHNIQUE: Contiguous axial images were obtained from the base of the skull through the vertex without intravenous contrast. RADIATION DOSE REDUCTION: This exam was performed according to the departmental dose-optimization program which includes automated exposure control, adjustment of the mA and/or kV according to patient size and/or use of iterative reconstruction technique. COMPARISON:  12/18/2014 FINDINGS: Brain: No acute intracranial findings are seen. There are no signs of bleeding within the cranium. Ventricles are not dilated. Vascular: Unremarkable. Skull: No acute findings are seen. Sinuses/Orbits: Unremarkable. Other: There is increased amount of CSF in the sella suggesting possible partial empty sella. IMPRESSION: No acute intracranial findings are seen in noncontrast CT brain. Electronically Signed   By: Ernie Avena M.D.   On: 08/19/2022 15:44   DG Chest 2 View  Result Date: 08/19/2022 CLINICAL DATA:  Tachycardia.  Shortness of breath. EXAM: CHEST - 2 VIEW COMPARISON:  X-ray 10/24/2021 FINDINGS: The heart size and mediastinal contours are within normal limits. Both lungs are clear. No consolidation, pneumothorax or effusion. No edema. The visualized skeletal structures are unremarkable. IMPRESSION: No acute cardiopulmonary disease Electronically Signed   By: Karen Kays M.D.   On: 08/19/2022 15:18    Procedures Procedures    Medications Ordered in ED Medications  lactated ringers bolus 1,000 mL (has no administration in time range)  droperidol (INAPSINE) 2.5 MG/ML injection 1.25 mg (has no administration in time range)    ED Course/ Medical Decision Making/ A&P                                  Medical Decision Making Amount and/or Complexity of Data Reviewed Labs: ordered. Radiology: ordered.  Risk Prescription drug management.   Medical Decision Making:   FRAIDY MCCARRICK is a 37 y.o. female who presented to the ED today with multiple concerns.  She reports vomiting, headache, recent episode of syncope, acute on chronic back pain.  She is mildly tachycardic but otherwise stable.  She has multiple concerns as above, and states she has had all of the symptoms before but that they are worsened today.  Appears to be likely acute on chronic symptoms.  Given the episode of syncope with fall and head injury will obtain CT head as well as chest x-ray for screening.  Evaluate with D-dimer for possible PE although favor less likely.  Her abdominal  exam is benign.  Her back pain is all over her back mostly muscular likely related to fibromyalgia which she has a known diagnosis of.  She has no focal midline pain, no radicular pain, and no weakness or numbness or saddle anesthesia or urinary problems low concern for fracture, malalignment, or cauda equina syndrome.  She has hemoglobin C but no sickle cell diagnosis that I can see.  She has many allergies, reviewed her prior charts and she seems to have responded well to droperidol before.  Will give this for nausea.   Patient placed on continuous vitals and telemetry monitoring while in ED which was reviewed periodically.  Reviewed and confirmed nursing documentation for past medical history, family history, social history.  Initial Study Results:   Laboratory  All laboratory results reviewed.  Labs notable for normal troponin, BNP, CBC unremarkable.  EKG EKG was reviewed independently. Rate, rhythm, axis, intervals all examined and without medically relevant abnormality. ST segments without concerns for elevations.    Radiology:  All images reviewed independently.  Chest x-ray, CT head unremarkable.  Agree  with radiology report at this time.      Reassessment and Plan:   Patient remained stable.  Workup so far is reassuring.  Handoff was given to Dr. Gunnar Fusi at 5 PM with plan to follow-up remainder of labs and reassess.   Patient's presentation is most consistent with acute complicated illness / injury requiring diagnostic workup.           Final Clinical Impression(s) / ED Diagnoses Final diagnoses:  None    Rx / DC Orders ED Discharge Orders     None         Laurence Spates, MD 08/19/22 1726

## 2022-08-24 ENCOUNTER — Encounter: Payer: Self-pay | Admitting: Neurology

## 2022-08-24 ENCOUNTER — Ambulatory Visit (INDEPENDENT_AMBULATORY_CARE_PROVIDER_SITE_OTHER): Payer: Medicaid Other | Admitting: Neurology

## 2022-08-24 VITALS — BP 147/89 | HR 92 | Ht 60.0 in | Wt 170.0 lb

## 2022-08-24 DIAGNOSIS — R519 Headache, unspecified: Secondary | ICD-10-CM | POA: Diagnosis not present

## 2022-08-24 MED ORDER — BUPIVACAINE HCL 0.5 % IJ SOLN
14.0000 mL | Freq: Once | INTRAMUSCULAR | Status: AC
Start: 2022-08-24 — End: ?

## 2022-08-24 MED ORDER — LIDOCAINE HCL 2 % IJ SOLN
4.0000 mL | Freq: Once | INTRAMUSCULAR | Status: AC
Start: 2022-08-24 — End: ?

## 2022-08-24 NOTE — Progress Notes (Unsigned)
[facility]    Provider:  Dr Lucia Gaskins Requesting Provider: Genice Rouge, MD Primary Care Provider:  Salli Real, MD  CC: Intractable migraines  HPI:  Tanya Krause is a 37 y.o. female here as requested by Genice Rouge, MD for intractable migraines. has Abdominal pain; Headache, tension type, chronic; Nausea & vomiting; Complicated migraine, intractable; Primary fibromyalgia syndrome; Lower extremity weakness; Vitamin B12 deficiency; Anxiety state; B12 deficiency; Chronic pain syndrome; Numbness; Intractable headache; Intractable chronic migraine without aura and without status migrainosus; Oral thrush; Colitis; Chronic bilateral back pain; Myofascial pain dysfunction syndrome; S/P hysterectomy; S/P TAH-BSO (total abdominal hysterectomy and bilateral salpingo-oophorectomy); Trochanteric bursitis of left hip; Encounter for long-term use of opiate analgesic; Encounter for medication monitoring; 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   Patient has intractable migraines, she has been to multiple neurologists, multiple doctors, she has tried a plethora of medications as seen below, she has been to multiple headache doctors, she says her migraines are intractable, she has daily continuous migraines, pulsating pounding throbbing, can be unilateral all all all over radiating to the neck with myofascial muscle pain, she has photophobia, phonophobia, osmophobia, it hurts to move, nothing is helping, no medication overuse, no aura, no vision changes, not positional or exertional, no signs of sleep apnea, location can be unilateral are all over the head, no medication overuse, no aura, they are moderate to severe and she has daily headaches and at least greater than 20 moderate to severe  migraines a month ongoing for years, she has had the migraines for years, no known inciting event, we had an extremely long talk today, she is even tried all the CGRP injections as well as had 1 Botox injection which she did not like/side effects.  We should go to Vyepti. No other focal neurologic deficits, associated symptoms, inciting events or modifiable factors.  I also provided nerve blocks today, she had never had nerve blocks, her pain went from an 8 out of 10 to a 0 out of 10.   Reviewed notes, labs and imaging from outside physicians, which showed:   From a thorough review of records, medications tried that can be used in headache or migraine management greater than 3 months includes: Tylenol, would hesitate to prescribe a beta-blocker such as propranolol due to asthma is on albuterol, Xanax, amitriptyline, amlodipine (calcium channel blocker in a similar class as verapamil), atenolol, 1 injection of Botox, Rexulti, Wellbutrin, BuSpar, Fioricet, Flexeril, Decadron, Benadryl, Cymbalta, Emgality, Aimovig contraindicated due to constipation, Ajovy, Prozac, gabapentin, magnesium, meclizine, Mobic, Robaxin, metoprolol, Solu-Medrol injections, morphine, naproxen, Nurtec, Ubrelvy,Zofran, Trileptal, oxycodone, prednisone tablets, Lyrica, Compazine, Phenergan, propranolol, Seroquel, Maxalt, Topamax, Zoloft, Zanaflex, tizanidine, tramadol, trazodone, Depacon, Effexor, Zomig,  Recent Results (from the past 2160 hour(s))  CBC with Differential     Status: Abnormal   Collection Time: 06/06/22  7:09 PM  Result Value Ref Range   WBC 12.1 (H) 4.0 - 10.5 K/uL   RBC 4.57 3.87 - 5.11 MIL/uL   Hemoglobin 12.6 12.0 - 15.0 g/dL   HCT 16.1 09.6 - 04.5 %   MCV 81.2 80.0 - 100.0 fL   MCH 27.6 26.0 - 34.0 pg   MCHC 34.0 30.0 - 36.0 g/dL   RDW 40.9 81.1 - 91.4 %   Platelets 263 150 - 400 K/uL   nRBC 0.0 0.0 - 0.2 %  Neutrophils Relative % 42 %   Neutro Abs 5.1 1.7 - 7.7 K/uL   Lymphocytes Relative 46 %    Lymphs Abs 5.6 (H) 0.7 - 4.0 K/uL   Monocytes Relative 7 %   Monocytes Absolute 0.8 0.1 - 1.0 K/uL   Eosinophils Relative 4 %   Eosinophils Absolute 0.5 0.0 - 0.5 K/uL   Basophils Relative 1 %   Basophils Absolute 0.1 0.0 - 0.1 K/uL   Immature Granulocytes 0 %   Abs Immature Granulocytes 0.04 0.00 - 0.07 K/uL    Comment: Performed at [facility], [address] [place], [place] [address]  Comprehensive metabolic panel     Status: None   Collection Time: 06/06/22  7:09 PM  Result Value Ref Range   Sodium 140 135 - 145 mmol/L   Potassium 3.5 3.5 - 5.1 mmol/L   Chloride 105 98 - 111 mmol/L   CO2 24 22 - 32 mmol/L   Glucose, Bld 86 70 - 99 mg/dL    Comment: Glucose reference range applies only to samples taken after fasting for at least 8 hours.   BUN 8 6 - 20 mg/dL   Creatinine, Ser 6.04 0.44 - 1.00 mg/dL   Calcium 9.7 8.9 - 54.0 mg/dL   Total Protein 7.7 6.5 - 8.1 g/dL   Albumin 4.0 3.5 - 5.0 g/dL   AST 19 15 - 41 U/L   ALT 14 0 - 44 U/L   Alkaline Phosphatase 56 38 - 126 U/L   Total Bilirubin 0.4 0.3 - 1.2 mg/dL   GFR, Estimated >98 >11 mL/min    Comment: (NOTE) Calculated using the CKD-EPI Creatinine Equation (2021)    Anion gap 11 5 - 15    Comment: Performed at [facility], [address], [place], [place] [address]  Lipase, blood     Status: Abnormal   Collection Time: 06/06/22  7:09 PM  Result Value Ref Range   Lipase 96 (H) 11 - 51 U/L    Comment: Performed at [facility], [address] [place], [place] [address]  Comprehensive metabolic panel     Status: Abnormal   Collection Time: 06/07/22  1:08 PM  Result Value Ref Range   Sodium 139 135 - 145 mmol/L   Potassium 3.3 (L) 3.5 - 5.1 mmol/L   Chloride 106 98 - 111 mmol/L   CO2 25 22 - 32 mmol/L   Glucose, Bld 84 70 - 99 mg/dL    Comment: Glucose reference range applies only to samples taken after fasting for at least 8 hours.   BUN 13 6 - 20 mg/dL   Creatinine, Ser 1.30 0.44 - 1.00 mg/dL    Calcium 8.9 8.9 - 86.5 mg/dL   Total Protein 7.6 6.5 - 8.1 g/dL   Albumin 4.1 3.5 - 5.0 g/dL   AST 15 15 - 41 U/L   ALT 14 0 - 44 U/L   Alkaline Phosphatase 55 38 - 126 U/L   Total Bilirubin 0.4 0.3 - 1.2 mg/dL   GFR, Estimated >78 >46 mL/min    Comment: (NOTE) Calculated using the CKD-EPI Creatinine Equation (2021)    Anion gap 8 5 - 15    Comment: Performed at [facility], [address], [place], [place] [address]  CBC with Differential     Status: Abnormal   Collection Time: 06/07/22  1:08 PM  Result Value Ref Range   WBC 9.9 4.0 - 10.5 K/uL  RBC 4.37 3.87 - 5.11 MIL/uL   Hemoglobin 12.4 12.0 - 15.0 g/dL   HCT 09.8 11.9 - 14.7 %   MCV 83.5 80.0 - 100.0 fL   MCH 28.4 26.0 - 34.0 pg   MCHC 34.0 30.0 - 36.0 g/dL   RDW 82.9 56.2 - 13.0 %   Platelets 265 150 - 400 K/uL   nRBC 0.0 0.0 - 0.2 %   Neutrophils Relative % 32 %   Neutro Abs 3.2 1.7 - 7.7 K/uL   Lymphocytes Relative 57 %   Lymphs Abs 5.6 (H) 0.7 - 4.0 K/uL   Monocytes Relative 6 %   Monocytes Absolute 0.6 0.1 - 1.0 K/uL   Eosinophils Relative 4 %   Eosinophils Absolute 0.4 0.0 - 0.5 K/uL   Basophils Relative 1 %   Basophils Absolute 0.1 0.0 - 0.1 K/uL   Immature Granulocytes 0 %   Abs Immature Granulocytes 0.02 0.00 - 0.07 K/uL    Comment: Performed at [facility], [address], [place], [place] [address]  Lactic acid, plasma     Status: None   Collection Time: 06/07/22  2:34 PM  Result Value Ref Range   Lactic Acid, Venous 0.7 0.5 - 1.9 mmol/L    Comment: Performed at [facility], [address] [place], [place] [address]  Lactic acid, plasma     Status: None   Collection Time: 06/07/22  4:03 PM  Result Value Ref Range   Lactic Acid, Venous 1.2 0.5 - 1.9 mmol/L    Comment: Performed at [facility], [address], [place], [place] [address]  Lipase, blood     Status: None   Collection Time: 06/07/22  4:03 PM  Result Value  Ref Range   Lipase 46 11 - 51 U/L    Comment: Performed at [facility], [address] [place], [place] [address]  Rapid urine drug screen (hospital performed)     Status: Abnormal   Collection Time: 06/07/22  4:03 PM  Result Value Ref Range   Opiates NONE DETECTED NONE DETECTED   Cocaine NONE DETECTED NONE DETECTED   Benzodiazepines NONE DETECTED NONE DETECTED   Amphetamines NONE DETECTED NONE DETECTED   Tetrahydrocannabinol POSITIVE (A) NONE DETECTED   Barbiturates NONE DETECTED NONE DETECTED    Comment: (NOTE) DRUG SCREEN FOR MEDICAL PURPOSES ONLY.  IF CONFIRMATION IS NEEDED FOR ANY PURPOSE, NOTIFY LAB WITHIN 5 DAYS.  LOWEST DETECTABLE LIMITS FOR URINE DRUG SCREEN Drug Class                     Cutoff (ng/mL) Amphetamine and metabolites    1000 Barbiturate and metabolites    200 Benzodiazepine                 200 Opiates and metabolites        300 Cocaine and metabolites        300 THC                            50 Performed at [facility], [address] [place], [place] [address]   Basic metabolic panel     Status: None   Collection Time: 08/19/22  4:20 PM  Result Value Ref Range   Sodium 136 135 - 145 mmol/L   Potassium 4.0 3.5 - 5.1 mmol/L   Chloride 102 98 - 111 mmol/L   CO2 26  22 - 32 mmol/L   Glucose, Bld 90 70 - 99 mg/dL    Comment: Glucose reference range applies only to samples taken after fasting for at least 8 hours.   BUN 10 6 - 20 mg/dL   Creatinine, Ser 3.66 0.44 - 1.00 mg/dL   Calcium 9.3 8.9 - 44.0 mg/dL   GFR, Estimated >34 >74 mL/min    Comment: (NOTE) Calculated using the CKD-EPI Creatinine Equation (2021)    Anion gap 8 5 - 15    Comment: Performed at [facility], [address] [place], [place] [address]  CBC     Status: None   Collection Time: 08/19/22  4:20 PM  Result Value Ref Range   WBC 8.9 4.0 - 10.5 K/uL   RBC 4.50 3.87 - 5.11 MIL/uL   Hemoglobin 12.6 12.0 - 15.0 g/dL    HCT 38.7 56.4 - 33.2 %   MCV 82.2 80.0 - 100.0 fL   MCH 28.0 26.0 - 34.0 pg   MCHC 34.1 30.0 - 36.0 g/dL   RDW 95.1 88.4 - 16.6 %   Platelets 283 150 - 400 K/uL   nRBC 0.0 0.0 - 0.2 %    Comment: Performed at [facility], [address] [place], [place] [address]  Troponin I (High Sensitivity)     Status: None   Collection Time: 08/19/22  4:20 PM  Result Value Ref Range   Troponin I (High Sensitivity) 2 <18 ng/L    Comment: (NOTE) Elevated high sensitivity troponin I (hsTnI) values and significant  changes across serial measurements may suggest ACS but many other  chronic and acute conditions are known to elevate hsTnI results.  Refer to the "Links" section for chest pain algorithms and additional  guidance. Performed at [facility], [address], [place], [place] [address]   Hepatic function panel     Status: None   Collection Time: 08/19/22  4:20 PM  Result Value Ref Range   Total Protein 7.4 6.5 - 8.1 g/dL   Albumin 4.0 3.5 - 5.0 g/dL   AST 17 15 - 41 U/L   ALT 18 0 - 44 U/L   Alkaline Phosphatase 60 38 - 126 U/L   Total Bilirubin 0.5 0.3 - 1.2 mg/dL   Bilirubin, Direct <3.2 0.0 - 0.2 mg/dL   Indirect Bilirubin NOT CALCULATED 0.3 - 0.9 mg/dL    Comment: Performed at [facility], [address], [place], [place] [address]  D-dimer, quantitative     Status: None   Collection Time: 08/19/22  4:20 PM  Result Value Ref Range   D-Dimer, Quant <0.27 0.00 - 0.50 ug/mL-FEU    Comment: (NOTE) At the manufacturer cut-off value of 0.5 g/mL FEU, this assay has a negative predictive value of 95-100%.This assay is intended for use in conjunction with a clinical pretest probability (PTP) assessment model to exclude pulmonary embolism (PE) and deep venous thrombosis (DVT) in outpatients suspected of PE or DVT. Results should be correlated with clinical presentation. Performed at [facility], [address] [place], [place] [address]   Lipase, blood     Status: Abnormal   Collection Time: 08/19/22  4:20 PM  Result Value Ref Range   Lipase 55 (H) 11 - 51 U/L    Comment: Performed at [facility], [address], [place], [place] [address]  Urinalysis, Routine w reflex microscopic -Urine, Clean Catch     Status: Abnormal  Collection Time: 08/19/22  6:41 PM  Result Value Ref Range   Color, Urine YELLOW YELLOW   APPearance HAZY (A) CLEAR   Specific Gravity, Urine 1.013 1.005 - 1.030   pH 6.0 5.0 - 8.0   Glucose, UA NEGATIVE NEGATIVE mg/dL   Hgb urine dipstick NEGATIVE NEGATIVE   Bilirubin Urine NEGATIVE NEGATIVE   Ketones, ur NEGATIVE NEGATIVE mg/dL   Protein, ur NEGATIVE NEGATIVE mg/dL   Nitrite NEGATIVE NEGATIVE   Leukocytes,Ua NEGATIVE NEGATIVE    Comment: Performed at [facility], [address], [place], [place] [address]    CT head 08-19-2022: EXAM: CT HEAD WITHOUT CONTRAST   TECHNIQUE: Contiguous axial images were obtained from the base of the skull through the vertex without intravenous contrast.   RADIATION DOSE REDUCTION: This exam was performed according to the departmental dose-optimization program which includes automated exposure control, adjustment of the mA and/or kV according to patient size and/or use of iterative reconstruction technique.   COMPARISON:  12/18/2014   FINDINGS: Brain: No acute intracranial findings are seen. There are no signs of bleeding within the cranium. Ventricles are not dilated.   Vascular: Unremarkable.   Skull: No acute findings are seen.   Sinuses/Orbits: Unremarkable.   Other: There is increased amount of CSF in the sella suggesting possible partial empty sella.   IMPRESSION: No acute intracranial findings are seen in noncontrast CT brain.  Review of Systems: Patient complains of symptoms per HPI as well as the following symptoms intractable migraines. Pertinent negatives and  positives per HPI. All others negative.   Social History   Socioeconomic History   Marital status: Divorced    Spouse name: Not on file   Number of children: 4   Years of education: 12   Highest education level: Not on file  Occupational History    Comment: unemployed  Tobacco Use   Smoking status: Some Days    Current packs/day: 0.14    Average packs/day: 0.1 packs/day for 5.0 years (0.7 ttl pk-yrs)    Types: Cigarettes   Smokeless tobacco: Never   Tobacco comments:    3 cig daily  Vaping Use   Vaping status: Former   Substances: CBD  Substance and Sexual Activity   Alcohol use: Not Currently    Comment: occ   Drug use: Not Currently    Types: Marijuana    Comment: last used september 2020   Sexual activity: Yes    Birth control/protection: Surgical  Other Topics Concern   Not on file  Social History Narrative   Lives with parents, children   Caffeine use- coffee once a day, sodas x 2 a day   Social Determinants of Corporate investment banker Strain: Not on file  Food Insecurity: Not on file  Transportation Needs: Not on file  Physical Activity: Not on file  Stress: Not on file  Social Connections: Not on file  Intimate Partner Violence: Not on file    Family History  Problem Relation Age of Onset   Hypertension Mother    Diabetes Mother    Heart failure Father    Colon cancer Paternal Grandfather    Healthy Daughter    Healthy Son    Healthy Son    Healthy Son     Past Medical History:  Diagnosis Date   Asthma    Chronic abdominal pain    Chronic migraine    Chronic pain syndrome    Fibromyalgia    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   Hypertension    Migraine    Pelvic inflammatory disease (PID)     Patient Active Problem List   Diagnosis Date Noted   Other physeal fracture of phalanx of right toe, initial encounter for closed fracture 07/17/2020   Insomnia due to medical condition 07/17/2019   Encounter for long-term use of opiate  analgesic 04/08/2019   Encounter for medication monitoring 04/08/2019   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 04/08/2019   Trochanteric bursitis of left hip 03/11/2019   S/P hysterectomy 02/06/2019   S/P TAH-BSO (total abdominal hysterectomy and bilateral salpingo-oophorectomy) 02/06/2019   Myofascial pain dysfunction syndrome 09/27/2018   Chronic bilateral back pain 09/03/2018   Colitis 08/12/2015   Oral thrush 11/18/2014   B12 deficiency 11/17/2014   Chronic pain syndrome 11/17/2014   Numbness 11/17/2014   Intractable headache 11/17/2014   Intractable chronic migraine without aura and without status migrainosus 11/17/2014   Complicated migraine, intractable 10/11/2014   Primary fibromyalgia syndrome 10/11/2014   Lower extremity weakness 10/11/2014   Vitamin B12 deficiency 10/11/2014   Anxiety state 10/11/2014   Nausea & vomiting 10/08/2014   Headache, tension type, chronic 09/07/2012   Abdominal pain 06/15/2012    Past Surgical History:  Procedure Laterality Date   HYSTERECTOMY ABDOMINAL WITH SALPINGO-OOPHORECTOMY Bilateral 02/06/2019   Procedure: HYSTERECTOMY ABDOMINAL WITH BILATERAL SALPINGO-OOPHORECTOMY;  Surgeon: Lazaro Arms, MD;  Location: [facility];  Service: Gynecology;  Laterality: Bilateral;   kidney infections     TUBAL LIGATION     WISDOM TOOTH EXTRACTION      Current Outpatient Medications  Medication Sig Dispense Refill   albuterol (PROVENTIL) (2.5 MG/3ML) 0.083% nebulizer solution      albuterol (VENTOLIN HFA) 108 (90 Base) MCG/ACT inhaler Inhale into the lungs.     brexpiprazole (REXULTI) 1 MG TABS tablet Take 1 tablet by mouth daily.     busPIRone (BUSPAR) 10 MG tablet Take 10 mg by mouth 3 (three) times daily.     cyclobenzaprine (FLEXERIL) 10 MG tablet Take by mouth.     diphenhydrAMINE (BENADRYL) 25 MG tablet Take by mouth.     EMGALITY 120 MG/ML SOAJ Inject 1 each into the skin every 30 (thirty)  days. No matter what- to decrease migraines- 1.12 mL 11   fluticasone (FLONASE) 50 MCG/ACT nasal spray      folic acid (FOLVITE) 800 MCG tablet Take by mouth.     gabapentin (NEURONTIN) 600 MG tablet Take 1.5 tablets (900 mg total) by mouth 3 (three) times daily. For fibromyalgia 150 tablet 5   hydrOXYzine (ATARAX/VISTARIL) 25 MG tablet Take 1 tablet (25 mg total) by mouth every 6 (six) hours as needed for anxiety. 12 tablet 0   naproxen (NAPROSYN) 500 MG tablet Take 1 tablet (500 mg total) by mouth 2 (two) times daily with a meal. 60 tablet 5   promethazine (PHENERGAN) 25 MG suppository Place 1 suppository (25 mg total) rectally every 6 (six) hours as needed for nausea or vomiting. 12 each 0   QUEtiapine (SEROQUEL) 50 MG tablet Take 1-2 tablets (50-100 mg total) by mouth at bedtime as needed (sleep). 60 tablet 5   triamcinolone (KENALOG) 0.025 % cream Apply 1 application  topically 2 (two) times daily.     zolpidem (AMBIEN) 5 MG tablet      Current Facility-Administered Medications  Medication Dose Route Frequency Provider Last Rate Last Admin   bupivacaine (MARCAINE) 0.5 % (with pres) injection 14 mL  14  mL Infiltration Once Anson Fret, MD       lidocaine (XYLOCAINE) 2 % (with pres) injection 80 mg  4 mL Intradermal Once Anson Fret, MD        Allergies as of 08/24/2022 - Review Complete 08/24/2022  Allergen Reaction Noted   Ketorolac Hives 12/30/2011   Metoclopramide Itching, Other (See Comments), and Rash 01/14/2012   Other Hives, Itching, and Swelling 08/11/2010   Tomato Shortness Of Breath, Swelling, Rash, and Hives 06/19/2012   Compazine [prochlorperazine] Anxiety 06/07/2022   Hydromorphone hcl Itching 10/10/2011   Ondansetron Nausea And Vomiting 04/06/2015   Shellfish allergy Hives 06/07/2010   Toradol [ketorolac tromethamine] Hives 12/30/2011   Aspirin Hives and Rash 06/07/2010   Doxycycline Nausea And Vomiting 01/14/2012   Hydromorphone Itching 10/10/2011    Hydromorphone hcl Itching 10/10/2011   Ibuprofen Hives and Rash 06/07/2010   Latex Itching and Rash 02/14/2011   Shellfish-derived products Rash 01/19/2012   Tramadol Rash 01/14/2015    Vitals: BP (!) 147/89   Pulse 92   Ht 5' (1.524 m)   Wt 170 lb (77.1 kg)   LMP 12/04/2018   BMI 33.20 kg/m  Last Weight:  Wt Readings from Last 1 Encounters:  08/24/22 170 lb (77.1 kg)   Last Height:   Ht Readings from Last 1 Encounters:  08/24/22 5' (1.524 m)     Physical exam: Exam: Gen: patient crying, conversant, well nourised, obese, well groomed                     CV: RRR, no MRG. No Carotid Bruits. No peripheral edema, warm, nontender Eyes: Conjunctivae clear without exudates or hemorrhage  Neuro: Detailed Neurologic Exam  Speech:    Speech is normal; fluent and spontaneous with normal comprehension.  Cognition:    The patient is oriented to person, place, and time;     recent and remote memory intact;     language fluent;     normal attention, concentration,     fund of knowledge Cranial Nerves:    The pupils are equal, round, and reactive to light.  Attempted, cannot visualize fundi due to photophobia.  Visual fields are full to finger confrontation. Extraocular movements are intact. Trigeminal sensation is intact and the muscles of mastication are normal. The face is symmetric. The palate elevates in the midline. Hearing intact. Voice is normal. Shoulder shrug is normal. The tongue has normal motion without fasciculations.   Coordination: Normal Gait: Normal Motor Observation:    No asymmetry, no atrophy, and no involuntary movements noted. Tone:    Normal muscle tone.    Posture:    Posture is normal. normal erect    Strength:    Strength is V/V in the upper and lower limbs.      Sensation: intact to LT     Reflex Exam:  DTR's:    Deep tendon reflexes in the upper and lower extremities are symmetrical bilaterally.   Toes:    The toes are downgoing  bilaterally.   Clonus:    Clonus is absent.    Assessment/Plan: Patient with chronic intractable migraines has been to multiple physicians and neurologists and headache doctors and failed a plethora of medications including other CGRP self injectors and even had 1 session of Botox which she did not want to continue.  At this time we will try Vyepti, if we can do 300 initially that would be the best but can start with 100.  Today we  will also perform nerve block she has an 8 out of 10 headache she responded extremely well went down to 0 out of 10 although this is just temporary we may continue to perform nerve blocks until we can get Vyepti approved.  START VYEPTI  From a thorough review of records, medications tried that can be used in headache or migraine management greater than 3 months includes: Tylenol, would hesitate to prescribe a beta-blocker such as propranolol due to asthma is on albuterol, Xanax, amitriptyline, amlodipine (calcium channel blocker in a similar class as verapamil), atenolol, 1 injection of Botox, Rexulti, Wellbutrin, BuSpar, Fioricet, Flexeril, Decadron, Benadryl, Cymbalta, Emgality, Aimovig contraindicated due to constipation, Ajovy, Prozac, gabapentin, magnesium, meclizine, Mobic, Robaxin, metoprolol, Solu-Medrol injections, morphine, naproxen, Nurtec, Ubrelvy,Zofran, Trileptal, oxycodone, prednisone tablets, Lyrica, Compazine, Phenergan, propranolol, Seroquel, Maxalt, Topamax, Zoloft, Zanaflex, tizanidine, tramadol, trazodone, Depacon, Effexor, Zomig,  Performed by Dr. Lucia Gaskins M.D. ***ml Lidocaine 1%,***ml Marcaine 0.5% in the 30-gauge needle was used. All procedures a documented blood were medically necessary, reasonable and appropriate based on the patient's history, medical diagnosis and physician opinion. Verbal informed consent was obtained from the patient, patient was informed of potential risk of procedure, including bruising, bleeding, hematoma formation, infection,  muscle weakness, muscle pain, numbness, transient hypertension, transient hyperglycemia and transient insomnia among others. All areas injected were topically clean with isopropyl rubbing alcohol. Nonsterile nonlatex gloves were worn during the procedure.  1. Greater occipital nerve block [phone]). The greater occipital nerve site was identified at the nuchal line medial to the occipital artery. Medication was injected into the left and right occipital nerve areas and suboccipital areas. Patient's condition is associated with inflammation of the greater occipital nerve and associated multiple groups. Injection was deemed medically necessary, reasonable and appropriate. Injection represents a separate and unique surgical service.  2. Lesser occipital nerve block [phone]). The lesser occipital nerve site was identified approximately 2 cm lateral to the greater occipital nerve. Occasion was injected into the left and right occipital nerve areas. Patient's condition is associated with inflammation of the lesser occipital nerve and associated muscle groups. Injection was deemed medically necessary, reasonable and appropriate. Injection represents a separate and unique surgical service.   3. Auriculotemporal nerve block (09811): The Auriculotemporal nerve site was identified along the posterior margin of the sternocleidomastoid muscle toward the base of the ear. Medication was injected into the left and right radicular temporal nerve areas. Patient's condition is associated with inflammation of the Auriculotemporal Nerve and associated muscle groups. Injection was deemed medically necessary, reasonable and appropriate. Injection represents a separate and unique surgical service.  4. Supraorbital nerve block (64400): Supraorbital nerve site was identified along the incision of the frontal bone on the orbital/supraorbital ridge. Medication was injected into the left and right supraorbital nerve areas. Patient's  condition is associated with inflammation of the supraorbital and associated muscle groups. Injection was deemed medically necessary, reasonable and appropriate. Injection represents a separate and unique surgical service.  Meds ordered this encounter  Medications   lidocaine (XYLOCAINE) 2 % (with pres) injection 80 mg    LOT: [id number] EXP: 07/2024 NDC: 62130-865-78   bupivacaine (MARCAINE) 0.5 % (with pres) injection 14 mL    LOT: [id number] EXP: 12/2023 NDC: 84132-440-10    Cc: Genice Rouge, MD,  Salli Real, MD  Naomie Dean, MD  [facility] [address] [place], [place] [address]  Phone [phone] Fax [phone]  I spent over 60 minutes of face-to-face and non-face-to-face time with patient on the  1. Intractable headache, unspecified chronicity pattern, unspecified headache type    diagnosis.  This included previsit chart review, lab review, study review, order entry, electronic health record documentation, patient education on the different diagnostic and therapeutic options, counseling and coordination of care, risks and benefits of management, compliance, or risk factor reduction. This does not include nerve blocks.

## 2022-08-24 NOTE — Progress Notes (Signed)
Nerve block w/o steroid: Pt signed consent  0.5% Bupivocaine 14 mL LOT: [id number] EXP: 12/2023 NDC: 16109-604-54  2% Lidocaine 4 mL LOT: [id number] EXP: 07/2024 NDC: 78295-621-30  Witnessed by Sheryle Hail CMA

## 2022-08-31 ENCOUNTER — Telehealth: Payer: Self-pay | Admitting: Neurology

## 2022-08-31 ENCOUNTER — Ambulatory Visit (INDEPENDENT_AMBULATORY_CARE_PROVIDER_SITE_OTHER): Payer: Medicaid Other | Admitting: Neurology

## 2022-08-31 DIAGNOSIS — R519 Headache, unspecified: Secondary | ICD-10-CM | POA: Diagnosis not present

## 2022-08-31 MED ORDER — BUPIVACAINE HCL 0.5 % IJ SOLN
24.0000 mL | Freq: Once | INTRAMUSCULAR | Status: AC
Start: 2022-08-31 — End: ?

## 2022-08-31 MED ORDER — LIDOCAINE HCL 2 % IJ SOLN
6.0000 mL | Freq: Once | INTRAMUSCULAR | Status: AC
Start: 2022-08-31 — End: ?

## 2022-08-31 NOTE — Progress Notes (Unsigned)
Nerve block w/o steroid: Pt signed consent   0.5% Bupivocaine 24 mL LOT: [id number] EXP: 11/2024 NDC: 54098-119-14   2% Lidocaine 6 mL LOT: [id number] EXP: 07/2024 NDC: 30865-784-69   Witnessed by: Alveria Apley

## 2022-08-31 NOTE — Telephone Encounter (Signed)
New start Vyepti signed  by provider and given to [facility] in [employer]

## 2022-08-31 NOTE — Telephone Encounter (Signed)
G43.711  Start vyepto protocol  Daily headaches, >20 moderate to severe migriane days monthly continuously(lasting >72 hours) Cn we start at 300mg ? If not start at 100mg  and we can increase  Reviewed notes, labs and imaging from outside physicians, which showed:   From a thorough review of records, medications tried that can be used in headache or migraine management greater than 3 months includes: Tylenol, would hesitate to prescribe a beta-blocker such as propranolol due to asthma is on albuterol, Xanax, amitriptyline, amlodipine (calcium channel blocker in a similar class as verapamil), atenolol, 1 injection of Botox, Rexulti, Wellbutrin, BuSpar, Fioricet, Flexeril, Decadron, Benadryl, Cymbalta, Emgality, Aimovig contraindicated due to constipation, Ajovy, Prozac, gabapentin, magnesium, meclizine, Mobic, Robaxin, metoprolol, Solu-Medrol injections, morphine, naproxen, Nurtec, Ubrelvy,Zofran, Trileptal, oxycodone, prednisone tablets, Lyrica, Compazine, Phenergan, propranolol, Seroquel, Maxalt, Topamax, Zoloft, Zanaflex, tizanidine, tramadol, trazodone, Depacon, Effexor, Zomig,

## 2022-08-31 NOTE — Progress Notes (Unsigned)
[facility]    Provider:  Dr Lucia Gaskins Requesting Provider: Salli Real, MD Primary Care Provider:  Salli Real, MD  CC:  ***  HPI:  Tanya Krause is a 37 y.o. female here as requested by Salli Real, MD for ***.  ***  Reviewed notes, labs and imaging from outside physicians, which showed ***  From a thorough review of records, medications tried that can be used in headache or migraine management greater than 3 months includes: Tylenol, would hesitate to prescribe a beta-blocker such as propranolol due to asthma is on albuterol, Xanax, amitriptyline, amlodipine (calcium channel blocker in a similar class as verapamil), atenolol, 1 injection of Botox, Rexulti, Wellbutrin, BuSpar, Fioricet, Flexeril, Decadron, Benadryl, Cymbalta, Emgality, Aimovig contraindicated due to constipation, Ajovy, Prozac, gabapentin, magnesium, meclizine, Mobic, Robaxin, metoprolol, Solu-Medrol injections, morphine, naproxen, Nurtec, Ubrelvy,Zofran, Trileptal, oxycodone, prednisone tablets, Lyrica, Compazine, Phenergan, propranolol, Seroquel, Maxalt, Topamax, Zoloft, Zanaflex, tizanidine, tramadol, trazodone, Depacon, Effexor, Zomig,  Review of Systems: Patient complains of symptoms per HPI as well as the following symptoms ***. Pertinent negatives and positives per HPI. All others negative.   Social History   Socioeconomic History   Marital status: Divorced    Spouse name: Not on file   Number of children: 4   Years of education: 12   Highest education level: Not on file  Occupational History    Comment: unemployed  Tobacco Use   Smoking status: Some Days    Current packs/day: 0.14    Average packs/day: 0.1 packs/day for 5.0 years (0.7 ttl pk-yrs)    Types: Cigarettes   Smokeless tobacco: Never   Tobacco comments:    3 cig daily  Vaping Use   Vaping status: Former   Substances: CBD  Substance and Sexual Activity   Alcohol use: Not Currently    Comment: occ   Drug use: Not Currently     Types: Marijuana    Comment: last used september 2020   Sexual activity: Yes    Birth control/protection: Surgical  Other Topics Concern   Not on file  Social History Narrative   Lives with parents, children   Caffeine use- coffee once a day, sodas x 2 a day   Social Determinants of Corporate investment banker Strain: Not on file  Food Insecurity: Not on file  Transportation Needs: Not on file  Physical Activity: Not on file  Stress: Not on file  Social Connections: Not on file  Intimate Partner Violence: Not on file    Family History  Problem Relation Age of Onset   Hypertension Mother    Diabetes Mother    Heart failure Father    Colon cancer Paternal Actor    Healthy Daughter    Healthy Son    Healthy Son    Healthy Son     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   Hypertension    Migraine    Pelvic inflammatory disease (PID)     Patient Active Problem List   Diagnosis Date Noted   Other physeal fracture of phalanx of right toe, initial encounter for closed fracture 07/17/2020   Insomnia due to medical condition 07/17/2019   Encounter for long-term use of opiate analgesic 04/08/2019   Encounter for medication monitoring 04/08/2019   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 04/08/2019   Trochanteric bursitis of left hip 03/11/2019   S/P hysterectomy 02/06/2019   S/P TAH-BSO (total abdominal hysterectomy and bilateral salpingo-oophorectomy) 02/06/2019   Myofascial pain dysfunction syndrome 09/27/2018   Chronic bilateral back pain 09/03/2018   Colitis 08/12/2015   Oral thrush 11/18/2014   B12 deficiency 11/17/2014   Chronic pain syndrome 11/17/2014   Numbness 11/17/2014   Intractable headache 11/17/2014   Intractable chronic migraine without aura and without  status migrainosus 11/17/2014   Complicated migraine, intractable 10/11/2014   Primary fibromyalgia syndrome 10/11/2014   Lower extremity weakness 10/11/2014   Vitamin B12 deficiency 10/11/2014   Anxiety state 10/11/2014   Nausea & vomiting 10/08/2014   Headache, tension type, chronic 09/07/2012   Abdominal pain 06/15/2012    Past Surgical History:  Procedure Laterality Date   HYSTERECTOMY ABDOMINAL WITH SALPINGO-OOPHORECTOMY Bilateral 02/06/2019   Procedure: HYSTERECTOMY ABDOMINAL WITH BILATERAL SALPINGO-OOPHORECTOMY;  Surgeon: Lazaro Arms, MD;  Location: [facility];  Service: Gynecology;  Laterality: Bilateral;   kidney infections     TUBAL LIGATION     WISDOM TOOTH EXTRACTION      Current Outpatient Medications  Medication Sig Dispense Refill   albuterol (PROVENTIL) (2.5 MG/3ML) 0.083% nebulizer solution      albuterol (VENTOLIN HFA) 108 (90 Base) MCG/ACT inhaler Inhale into the lungs.     brexpiprazole (REXULTI) 1 MG TABS tablet Take 1 tablet by mouth daily.     busPIRone (BUSPAR) 10 MG tablet Take 10 mg by mouth 3 (three) times daily.     cyclobenzaprine (FLEXERIL) 10 MG tablet Take by mouth.     diphenhydrAMINE (BENADRYL) 25 MG tablet Take by mouth.     EMGALITY 120 MG/ML SOAJ Inject 1 each into the skin every 30 (thirty) days. No matter what- to decrease migraines- 1.12 mL 11   fluticasone (FLONASE) 50 MCG/ACT nasal spray      folic acid (FOLVITE) 800 MCG tablet Take by mouth.     gabapentin (NEURONTIN) 600 MG tablet Take 1.5 tablets (900 mg total) by mouth 3 (three) times daily. For fibromyalgia 150 tablet 5   hydrOXYzine (ATARAX/VISTARIL) 25 MG tablet Take 1 tablet (25 mg total) by mouth every 6 (six) hours as needed for anxiety. 12 tablet 0   naproxen (NAPROSYN) 500 MG tablet Take 1 tablet (500 mg total) by mouth 2 (two) times daily with a meal. 60 tablet 5   promethazine (PHENERGAN) 25 MG suppository Place 1 suppository (25 mg total) rectally every 6 (six) hours as needed  for nausea or vomiting. 12 each 0   QUEtiapine (SEROQUEL) 50 MG tablet Take 1-2 tablets (50-100 mg total) by mouth at bedtime as needed (sleep). 60 tablet 5   triamcinolone (KENALOG) 0.025 % cream Apply 1 application  topically 2 (two) times daily.     zolpidem (AMBIEN) 5 MG tablet      Current Facility-Administered Medications  Medication Dose Route Frequency Provider Last Rate Last Admin   bupivacaine (MARCAINE) 0.5 % (with pres) injection 14 mL  14 mL Infiltration Once Anson Fret, MD       lidocaine (XYLOCAINE) 2 % (with pres) injection 80 mg  4 mL Intradermal Once Anson Fret, MD        Allergies as of 08/31/2022 - Review Complete 08/24/2022  Allergen Reaction Noted   Ketorolac Hives 12/30/2011   Metoclopramide Itching, Other (See Comments), and Rash 01/14/2012   Other Hives, Itching, and Swelling 08/11/2010   Tomato Shortness Of Breath,  Swelling, Rash, and Hives 06/19/2012   Compazine [prochlorperazine] Anxiety 06/07/2022   Hydromorphone hcl Itching 10/10/2011   Ondansetron Nausea And Vomiting 04/06/2015   Shellfish allergy Hives 06/07/2010   Toradol [ketorolac tromethamine] Hives 12/30/2011   Aspirin Hives and Rash 06/07/2010   Doxycycline Nausea And Vomiting 01/14/2012   Hydromorphone Itching 10/10/2011   Hydromorphone hcl Itching 10/10/2011   Ibuprofen Hives and Rash 06/07/2010   Latex Itching and Rash 02/14/2011   Shellfish-derived products Rash 01/19/2012   Tramadol Rash 01/14/2015    Vitals: LMP 12/04/2018  Last Weight:  Wt Readings from Last 1 Encounters:  08/24/22 170 lb (77.1 kg)   Last Height:   Ht Readings from Last 1 Encounters:  08/24/22 5' (1.524 m)     Physical exam: Exam: Gen: NAD, conversant, well nourised, obese, well groomed                     CV: RRR, no MRG. No Carotid Bruits. No peripheral edema, warm, nontender Eyes: Conjunctivae clear without exudates or hemorrhage  Neuro: Detailed Neurologic Exam  Speech:    Speech is  normal; fluent and spontaneous with normal comprehension.  Cognition:    The patient is oriented to person, place, and time;     recent and remote memory intact;     language fluent;     normal attention, concentration,     fund of knowledge Cranial Nerves:    The pupils are equal, round, and reactive to light. The fundi are normal and spontaneous venous pulsations are present. Visual fields are full to finger confrontation. Extraocular movements are intact. Trigeminal sensation is intact and the muscles of mastication are normal. The face is symmetric. The palate elevates in the midline. Hearing intact. Voice is normal. Shoulder shrug is normal. The tongue has normal motion without fasciculations.   Coordination:    Normal finger to nose and heel to shin. Normal rapid alternating movements.   Gait:    Heel-toe and tandem gait are normal.   Motor Observation:    No asymmetry, no atrophy, and no involuntary movements noted. Tone:    Normal muscle tone.    Posture:    Posture is normal. normal erect    Strength:    Strength is V/V in the upper and lower limbs.      Sensation: intact to LT     Reflex Exam:  DTR's:    Deep tendon reflexes in the upper and lower extremities are normal bilaterally.   Toes:    The toes are downgoing bilaterally.   Clonus:    Clonus is absent.    Assessment/Plan:    No orders of the defined types were placed in this encounter.  No orders of the defined types were placed in this encounter.   Cc: Salli Real, MD,  Salli Real, MD  Naomie Dean, MD  [facility] [address] [place], [place] [address]  Phone [phone] Fax [phone]

## 2022-09-01 ENCOUNTER — Encounter: Payer: Self-pay | Admitting: Neurology

## 2022-09-05 ENCOUNTER — Ambulatory Visit: Payer: Medicaid Other | Admitting: Neurology

## 2022-09-05 ENCOUNTER — Telehealth: Payer: Self-pay | Admitting: Neurology

## 2022-09-05 NOTE — Telephone Encounter (Signed)
130 August 27th thanks

## 2022-09-05 NOTE — Telephone Encounter (Signed)
Pt is asking to be called for assistance in rescheduling her injection that is scheduled for today

## 2022-09-05 NOTE — Telephone Encounter (Signed)
That appt is currently pending for another patient. Will keep looking.

## 2022-09-06 NOTE — Telephone Encounter (Signed)
August 28 at 4pm?

## 2022-09-07 ENCOUNTER — Encounter
Payer: Medicaid Other | Attending: Physical Medicine and Rehabilitation | Admitting: Physical Medicine and Rehabilitation

## 2022-09-07 DIAGNOSIS — G43719 Chronic migraine without aura, intractable, without status migrainosus: Secondary | ICD-10-CM | POA: Insufficient documentation

## 2022-09-07 DIAGNOSIS — M7918 Myalgia, other site: Secondary | ICD-10-CM | POA: Insufficient documentation

## 2022-09-07 DIAGNOSIS — R112 Nausea with vomiting, unspecified: Secondary | ICD-10-CM | POA: Insufficient documentation

## 2022-09-07 DIAGNOSIS — M797 Fibromyalgia: Secondary | ICD-10-CM | POA: Insufficient documentation

## 2022-09-14 ENCOUNTER — Ambulatory Visit: Payer: Medicaid Other | Admitting: Neurology

## 2022-09-26 ENCOUNTER — Telehealth: Payer: Self-pay | Admitting: Neurology

## 2022-09-26 NOTE — Telephone Encounter (Signed)
QMV:HQIONGEX PHONE NOTE pt called to f/u on a response to her request made earlier that was forwarded from [facility], [place] to Dr Lucia Gaskins

## 2022-09-26 NOTE — Telephone Encounter (Signed)
Spoke to patient made her aware that Dr Lucia Gaskins is not doing nerve blocks often anymore Informed pt since migraines have increased she can go to Urgent care to get a migraine cocktail to break cycle of migraines, call PCP to get a sooner appointment. Did make pt an appointment 09/28/2022 with Dr Lucia Gaskins to discuss her migraine medication management. Pt thanked me for calling and sooner appointment . Will forward to Dr Lucia Gaskins to make her aware

## 2022-09-26 NOTE — Telephone Encounter (Signed)
Pt called wanting to know when her Nerver block will be r/s because she states that the pain is unbearable. Please advise.

## 2022-09-26 NOTE — Telephone Encounter (Signed)
Are you willing to do a nerve block on this patient?

## 2022-09-28 ENCOUNTER — Encounter: Payer: Self-pay | Admitting: Neurology

## 2022-09-28 ENCOUNTER — Ambulatory Visit (INDEPENDENT_AMBULATORY_CARE_PROVIDER_SITE_OTHER): Payer: Self-pay | Admitting: Neurology

## 2022-09-28 VITALS — BP 135/98 | HR 95 | Ht 60.0 in | Wt 171.4 lb

## 2022-09-28 DIAGNOSIS — G43719 Chronic migraine without aura, intractable, without status migrainosus: Secondary | ICD-10-CM

## 2022-09-28 DIAGNOSIS — G43901 Migraine, unspecified, not intractable, with status migrainosus: Secondary | ICD-10-CM

## 2022-09-28 NOTE — Progress Notes (Signed)
[id number]    Provider:  Dr Lucia Gaskins Requesting Provider: Salli Real, MD Primary Care Provider:  Salli Real, MD  CC: Intractable migraines  Has first appointment vyepti next week. She is not sleeping, not eating, throwing it up. She looks like she is having a bad migriane. Brought to infusion no appointment. Below preserved for next visit:  HPI:  Tanya Krause is a 37 y.o. female here as requested by Salli Real, MD for intractable migraines. has Abdominal pain; Headache, tension type, chronic; Nausea & vomiting; Complicated migraine, intractable; Primary fibromyalgia syndrome; Lower extremity weakness; Vitamin B12 deficiency; Anxiety state; B12 deficiency; Chronic pain syndrome; Numbness; Intractable headache; Intractable chronic migraine without aura and without status migrainosus; Oral thrush; Colitis; Chronic bilateral back pain; Myofascial pain dysfunction syndrome; S/P hysterectomy; S/P TAH-BSO (total abdominal hysterectomy and bilateral salpingo-oophorectomy); Trochanteric bursitis of left hip; Encounter for long-term use of opiate analgesic; Encounter for medication monitoring; 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   Patient has intractable migraines, she has been to multiple neurologists, multiple doctors, she has tried a plethora of medications as seen below, she has been to multiple headache doctors, she says her migraines are intractable, she has daily continuous migraines, pulsating pounding throbbing, can be unilateral all all all over radiating to the neck with myofascial muscle pain, she has photophobia, phonophobia, osmophobia, it hurts to move, nothing is helping, no medication overuse, no aura, no vision changes, not positional or exertional, no signs of sleep  apnea, location can be unilateral are all over the head, no medication overuse, no aura, they are moderate to severe and she has daily headaches and at least greater than 20 moderate to severe migraines a month ongoing for years, she has had the migraines for years, no known inciting event, we had an extremely long talk today, she is even tried all the CGRP injections as well as had 1 Botox injection which she did not like/side effects.  We should go to Vyepti. No other focal neurologic deficits, associated symptoms, inciting events or modifiable factors.  I also provided nerve blocks today, she had never had nerve blocks, her pain went from an 8 out of 10 to a 0 out of 10.   Reviewed notes, labs and imaging from outside physicians, which showed:   From a thorough review of records, medications tried that can be used in headache or migraine management greater than 3 months includes: Tylenol, would hesitate to prescribe a beta-blocker such as propranolol due to asthma is on albuterol, Xanax, amitriptyline, amlodipine (calcium channel blocker in a similar class as verapamil), atenolol, 1 injection of Botox, Rexulti, Wellbutrin, BuSpar, Fioricet, Flexeril, Decadron, Benadryl, Cymbalta, Emgality, Aimovig contraindicated due to constipation, Ajovy, Prozac, gabapentin, magnesium, meclizine, Mobic, Robaxin, metoprolol, Solu-Medrol injections, morphine, naproxen, Nurtec, Ubrelvy,Zofran, Trileptal, oxycodone, prednisone tablets, Lyrica, Compazine, Phenergan, propranolol, Seroquel, Maxalt, Topamax, Zoloft, Zanaflex, tizanidine, tramadol, trazodone, Depacon, Effexor, Zomig,  Recent Results (from the past 2160 hour(s))  Basic metabolic panel     Status: None   Collection Time: 08/19/22  4:20 PM  Result Value Ref Range   Sodium 136 135 - 145 mmol/L   Potassium 4.0 3.5 - 5.1 mmol/L   Chloride 102 98 - 111 mmol/L   CO2 26 22 - 32 mmol/L   Glucose, Bld 90 70 - 99 mg/dL    Comment:  Glucose reference range applies  only to samples taken after fasting for at least 8 hours.   BUN 10 6 - 20 mg/dL   Creatinine, Ser 1.61 0.44 - 1.00 mg/dL   Calcium 9.3 8.9 - 09.6 mg/dL   GFR, Estimated >04 >54 mL/min    Comment: (NOTE) Calculated using the CKD-EPI Creatinine Equation (2021)    Anion gap 8 5 - 15    Comment: Performed at [facility], [address], [place], [place] [address]  CBC     Status: None   Collection Time: 08/19/22  4:20 PM  Result Value Ref Range   WBC 8.9 4.0 - 10.5 K/uL   RBC 4.50 3.87 - 5.11 MIL/uL   Hemoglobin 12.6 12.0 - 15.0 g/dL   HCT 91.4 78.2 - 95.6 %   MCV 82.2 80.0 - 100.0 fL   MCH 28.0 26.0 - 34.0 pg   MCHC 34.1 30.0 - 36.0 g/dL   RDW 21.3 08.6 - 57.8 %   Platelets 283 150 - 400 K/uL   nRBC 0.0 0.0 - 0.2 %    Comment: Performed at [facility], [address] [place], [place] [address]  Troponin I (High Sensitivity)     Status: None   Collection Time: 08/19/22  4:20 PM  Result Value Ref Range   Troponin I (High Sensitivity) 2 <18 ng/L    Comment: (NOTE) Elevated high sensitivity troponin I (hsTnI) values and significant  changes across serial measurements may suggest ACS but many other  chronic and acute conditions are known to elevate hsTnI results.  Refer to the "Links" section for chest pain algorithms and additional  guidance. Performed at [facility], [address] [place], [place] [address]   Hepatic function panel     Status: None   Collection Time: 08/19/22  4:20 PM  Result Value Ref Range   Total Protein 7.4 6.5 - 8.1 g/dL   Albumin 4.0 3.5 - 5.0 g/dL   AST 17 15 - 41 U/L   ALT 18 0 - 44 U/L   Alkaline Phosphatase 60 38 - 126 U/L   Total Bilirubin 0.5 0.3 - 1.2 mg/dL   Bilirubin, Direct <1.3 0.0 - 0.2 mg/dL   Indirect Bilirubin NOT CALCULATED 0.3 - 0.9 mg/dL    Comment: Performed at [facility], [address], [place], [place] [address]  D-dimer, quantitative     Status:  None   Collection Time: 08/19/22  4:20 PM  Result Value Ref Range   D-Dimer, Quant <0.27 0.00 - 0.50 ug/mL-FEU    Comment: (NOTE) At the manufacturer cut-off value of 0.5 g/mL FEU, this assay has a negative predictive value of 95-100%.This assay is intended for use in conjunction with a clinical pretest probability (PTP) assessment model to exclude pulmonary embolism (PE) and deep venous thrombosis (DVT) in outpatients suspected of PE or DVT. Results should be correlated with clinical presentation. Performed at [facility], [address] [place], [place] [address]   Lipase, blood     Status: Abnormal   Collection Time: 08/19/22  4:20 PM  Result Value Ref Range   Lipase 55 (H) 11 - 51 U/L    Comment: Performed at [facility], [address], [place], [place] [address]  Urinalysis, Routine w reflex microscopic -Urine, Clean Catch     Status: Abnormal   Collection Time: 08/19/22  6:41 PM  Result Value Ref Range   Color, Urine YELLOW  YELLOW   APPearance HAZY (A) CLEAR   Specific Gravity, Urine 1.013 1.005 - 1.030   pH 6.0 5.0 - 8.0   Glucose, UA NEGATIVE NEGATIVE mg/dL   Hgb urine dipstick NEGATIVE NEGATIVE   Bilirubin Urine NEGATIVE NEGATIVE   Ketones, ur NEGATIVE NEGATIVE mg/dL   Protein, ur NEGATIVE NEGATIVE mg/dL   Nitrite NEGATIVE NEGATIVE   Leukocytes,Ua NEGATIVE NEGATIVE    Comment: Performed at [facility], [address], [place], [place] [address]    CT head 08-19-2022: EXAM: CT HEAD WITHOUT CONTRAST   TECHNIQUE: Contiguous axial images were obtained from the base of the skull through the vertex without intravenous contrast.   RADIATION DOSE REDUCTION: This exam was performed according to the departmental dose-optimization program which includes automated exposure control, adjustment of the mA and/or kV according to patient size and/or use of iterative reconstruction technique.   COMPARISON:   12/18/2014   FINDINGS: Brain: No acute intracranial findings are seen. There are no signs of bleeding within the cranium. Ventricles are not dilated.   Vascular: Unremarkable.   Skull: No acute findings are seen.   Sinuses/Orbits: Unremarkable.   Other: There is increased amount of CSF in the sella suggesting possible partial empty sella.   IMPRESSION: No acute intracranial findings are seen in noncontrast CT brain.  Review of Systems: Patient complains of symptoms per HPI as well as the following symptoms intractable migraines. Pertinent negatives and positives per HPI. All others negative.   Social History   Socioeconomic History   Marital status: Divorced    Spouse name: Not on file   Number of children: 4   Years of education: 12   Highest education level: Not on file  Occupational History    Comment: unemployed  Tobacco Use   Smoking status: Some Days    Current packs/day: 0.14    Average packs/day: 0.1 packs/day for 5.0 years (0.7 ttl pk-yrs)    Types: Cigarettes   Smokeless tobacco: Never   Tobacco comments:    3 cig daily  Vaping Use   Vaping status: Former   Substances: CBD  Substance and Sexual Activity   Alcohol use: Not Currently    Comment: occ   Drug use: Not Currently    Types: Marijuana    Comment: last used september 2020   Sexual activity: Yes    Birth control/protection: Surgical  Other Topics Concern   Not on file  Social History Narrative   Lives with parents, children   Caffeine use- coffee once a day, sodas x 2 a day   Social Determinants of Corporate investment banker Strain: Not on file  Food Insecurity: Not on file  Transportation Needs: Not on file  Physical Activity: Not on file  Stress: Not on file  Social Connections: Not on file  Intimate Partner Violence: Not on file    Family History  Problem Relation Age of Onset   Hypertension Mother    Diabetes Mother    Migraines Mother    Heart failure Father    Colon  cancer Paternal Grandfather    Healthy Daughter    Healthy Son    Healthy Son    Healthy Son     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   Hypertension    Migraine  Pelvic inflammatory disease (PID)     Patient Active Problem List   Diagnosis Date Noted   Other physeal fracture of phalanx of right toe, initial encounter for closed fracture 07/17/2020   Insomnia due to medical condition 07/17/2019   Encounter for long-term use of opiate analgesic 04/08/2019   Encounter for medication monitoring 04/08/2019   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 04/08/2019   Trochanteric bursitis of left hip 03/11/2019   S/P hysterectomy 02/06/2019   S/P TAH-BSO (total abdominal hysterectomy and bilateral salpingo-oophorectomy) 02/06/2019   Myofascial pain dysfunction syndrome 09/27/2018   Chronic bilateral back pain 09/03/2018   Colitis 08/12/2015   Oral thrush 11/18/2014   B12 deficiency 11/17/2014   Chronic pain syndrome 11/17/2014   Numbness 11/17/2014   Intractable headache 11/17/2014   Intractable chronic migraine without aura and without status migrainosus 11/17/2014   Complicated migraine, intractable 10/11/2014   Primary fibromyalgia syndrome 10/11/2014   Lower extremity weakness 10/11/2014   Vitamin B12 deficiency 10/11/2014   Anxiety state 10/11/2014   Nausea & vomiting 10/08/2014   Headache, tension type, chronic 09/07/2012   Abdominal pain 06/15/2012    Past Surgical History:  Procedure Laterality Date   HYSTERECTOMY ABDOMINAL WITH SALPINGO-OOPHORECTOMY Bilateral 02/06/2019   Procedure: HYSTERECTOMY ABDOMINAL WITH BILATERAL SALPINGO-OOPHORECTOMY;  Surgeon: Lazaro Arms, MD;  Location: [facility];  Service: Gynecology;  Laterality: Bilateral;   kidney infections     TUBAL LIGATION      WISDOM TOOTH EXTRACTION      Current Outpatient Medications  Medication Sig Dispense Refill   albuterol (PROVENTIL) (2.5 MG/3ML) 0.083% nebulizer solution      albuterol (VENTOLIN HFA) 108 (90 Base) MCG/ACT inhaler Inhale into the lungs.     brexpiprazole (REXULTI) 1 MG TABS tablet Take 1 tablet by mouth daily.     busPIRone (BUSPAR) 10 MG tablet Take 10 mg by mouth 3 (three) times daily.     cyclobenzaprine (FLEXERIL) 10 MG tablet Take by mouth.     diphenhydrAMINE (BENADRYL) 25 MG tablet Take by mouth.     EMGALITY 120 MG/ML SOAJ Inject 1 each into the skin every 30 (thirty) days. No matter what- to decrease migraines- 1.12 mL 11   fluticasone (FLONASE) 50 MCG/ACT nasal spray      folic acid (FOLVITE) 800 MCG tablet Take by mouth.     gabapentin (NEURONTIN) 600 MG tablet Take 1.5 tablets (900 mg total) by mouth 3 (three) times daily. For fibromyalgia 150 tablet 5   hydrOXYzine (ATARAX/VISTARIL) 25 MG tablet Take 1 tablet (25 mg total) by mouth every 6 (six) hours as needed for anxiety. 12 tablet 0   naproxen (NAPROSYN) 500 MG tablet Take 1 tablet (500 mg total) by mouth 2 (two) times daily with a meal. 60 tablet 5   promethazine (PHENERGAN) 25 MG suppository Place 1 suppository (25 mg total) rectally every 6 (six) hours as needed for nausea or vomiting. 12 each 0   QUEtiapine (SEROQUEL) 50 MG tablet Take 1-2 tablets (50-100 mg total) by mouth at bedtime as needed (sleep). 60 tablet 5   triamcinolone (KENALOG) 0.025 % cream Apply 1 application  topically 2 (two) times daily.     zolpidem (AMBIEN) 5 MG tablet      Current Facility-Administered Medications  Medication Dose Route Frequency Provider Last Rate Last Admin   bupivacaine (MARCAINE) 0.5 % (with pres) injection 14 mL  14 mL Infiltration Once Anson Fret, MD  bupivacaine (MARCAINE) 0.5 % (with pres) injection 24 mL  24 mL Infiltration Once Anson Fret, MD       lidocaine (XYLOCAINE) 2 % (with pres) injection 120 mg   6 mL Intradermal Once Anson Fret, MD       lidocaine (XYLOCAINE) 2 % (with pres) injection 80 mg  4 mL Intradermal Once Anson Fret, MD        Allergies as of 09/28/2022 - Review Complete 09/28/2022  Allergen Reaction Noted   Ketorolac Hives 12/30/2011   Metoclopramide Itching, Other (See Comments), and Rash 01/14/2012   Other Hives, Itching, and Swelling 08/11/2010   Tomato Shortness Of Breath, Swelling, Rash, and Hives 06/19/2012   Compazine [prochlorperazine] Anxiety 06/07/2022   Hydromorphone hcl Itching 10/10/2011   Ondansetron Nausea And Vomiting 04/06/2015   Shellfish allergy Hives 06/07/2010   Toradol [ketorolac tromethamine] Hives 12/30/2011   Aspirin Hives and Rash 06/07/2010   Doxycycline Nausea And Vomiting 01/14/2012   Hydromorphone Itching 10/10/2011   Hydromorphone hcl Itching 10/10/2011   Ibuprofen Hives and Rash 06/07/2010   Latex Itching and Rash 02/14/2011   Shellfish-derived products Rash 01/19/2012   Tramadol Rash 01/14/2015    Vitals: BP (!) 135/98   Pulse 95   Ht 5' (1.524 m)   Wt 171 lb 6.4 oz (77.7 kg)   LMP 12/04/2018   BMI 33.47 kg/m  Last Weight:  Wt Readings from Last 1 Encounters:  09/28/22 171 lb 6.4 oz (77.7 kg)   Last Height:   Ht Readings from Last 1 Encounters:  09/28/22 5' (1.524 m)     Physical exam: Exam: Gen: patient crying, conversant, well nourised, obese, well groomed                     CV: RRR, no MRG. No Carotid Bruits. No peripheral edema, warm, nontender Eyes: Conjunctivae clear without exudates or hemorrhage  Neuro: Detailed Neurologic Exam  Speech:    Speech is normal; fluent and spontaneous with normal comprehension.  Cognition:    The patient is oriented to person, place, and time;     recent and remote memory intact;     language fluent;     normal attention, concentration,     fund of knowledge Cranial Nerves:    The pupils are equal, round, and reactive to light.  Attempted, cannot visualize  fundi due to photophobia.  Visual fields are full to finger confrontation. Extraocular movements are intact. Trigeminal sensation is intact and the muscles of mastication are normal. The face is symmetric. The palate elevates in the midline. Hearing intact. Voice is normal. Shoulder shrug is normal. The tongue has normal motion without fasciculations.   Coordination: Normal Gait: Normal Motor Observation:    No asymmetry, no atrophy, and no involuntary movements noted. Tone:    Normal muscle tone.    Posture:    Posture is normal. normal erect    Strength:    Strength is V/V in the upper and lower limbs.      Sensation: intact to LT     Reflex Exam:  DTR's:    Deep tendon reflexes in the upper and lower extremities are symmetrical bilaterally.   Toes:    The toes are downgoing bilaterally.   Clonus:    Clonus is absent.    Assessment/Plan: Patient with chronic intractable migraines has been to multiple physicians and neurologists and headache doctors and failed a plethora of medications including other CGRP self injectors and even  had 1 session of Botox which she did not want to continue.  At this time we will try Vyepti, if we can do 300 initially that would be the best but can start with 100.  Today we will also perform nerve block she has an 8 out of 10 headache she responded extremely well went down to 0 out of 10 although this is just temporary we may continue to perform nerve blocks until we can get Vyepti approved.  START VYEPTI  From a thorough review of records, medications tried that can be used in headache or migraine management greater than 3 months includes: Tylenol, would hesitate to prescribe a beta-blocker such as propranolol due to asthma is on albuterol, Xanax, amitriptyline, amlodipine (calcium channel blocker in a similar class as verapamil), atenolol, 1 injection of Botox, Rexulti, Wellbutrin, BuSpar, Fioricet, Flexeril, Decadron, Benadryl, Cymbalta, Emgality,  Aimovig contraindicated due to constipation, Ajovy, Prozac, gabapentin, magnesium, meclizine, Mobic, Robaxin, metoprolol, Solu-Medrol injections, morphine, naproxen, Nurtec, Ubrelvy,Zofran, Trileptal, oxycodone, prednisone tablets, Lyrica, Compazine, Phenergan, propranolol, Seroquel, Maxalt, Topamax, Zoloft, Zanaflex, tizanidine, tramadol, trazodone, Depacon, Effexor, Zomig,  Performed by Dr. Lucia Gaskins M.D.  30-gauge needle was used. All procedures a documented blood were medically necessary, reasonable and appropriate based on the patient's history, medical diagnosis and physician opinion. Verbal informed consent was obtained from the patient, patient was informed of potential risk of procedure, including bruising, bleeding, hematoma formation, infection, muscle weakness, muscle pain, numbness, transient hypertension, transient hyperglycemia and transient insomnia among others. All areas injected were topically clean with isopropyl rubbing alcohol. Nonsterile nonlatex gloves were worn during the procedure.  1. Greater occipital nerve block [phone]). The greater occipital nerve site was identified at the nuchal line medial to the occipital artery. Medication was injected into the left and right occipital nerve areas and suboccipital areas. Patient's condition is associated with inflammation of the greater occipital nerve and associated multiple groups. Injection was deemed medically necessary, reasonable and appropriate. Injection represents a separate and unique surgical service.  2. Lesser occipital nerve block [phone]). The lesser occipital nerve site was identified approximately 2 cm lateral to the greater occipital nerve. Occasion was injected into the left and right occipital nerve areas. Patient's condition is associated with inflammation of the lesser occipital nerve and associated muscle groups. Injection was deemed medically necessary, reasonable and appropriate. Injection represents a separate and unique  surgical service.   3. Auriculotemporal nerve block (09811): The Auriculotemporal nerve site was identified along the posterior margin of the sternocleidomastoid muscle toward the base of the ear. Medication was injected into the left and right radicular temporal nerve areas. Patient's condition is associated with inflammation of the Auriculotemporal Nerve and associated muscle groups. Injection was deemed medically necessary, reasonable and appropriate. Injection represents a separate and unique surgical service.  4. Supraorbital nerve block ([id number]): Supraorbital nerve site was identified along the incision of the frontal bone on the orbital/supraorbital ridge. Medication was injected into the left and right supraorbital nerve areas. Patient's condition is associated with inflammation of the supraorbital and associated muscle groups. Injection was deemed medically necessary, reasonable and appropriate. Injection represents a separate and unique surgical service.  No orders of the defined types were placed in this encounter.   Cc: Salli Real, MD,  Salli Real, MD  Naomie Dean, MD  [facility] [address] [place], [place] [address]  Phone [phone] Fax [phone]  I spent over 60 minutes of face-to-face and non-face-to-face time with patient on the  No diagnosis  found.  diagnosis.  This included previsit chart review, lab review, study review, order entry, electronic health record documentation, patient education on the different diagnostic and therapeutic options, counseling and coordination of care, risks and benefits of management, compliance, or risk factor reduction. This does not include time spent on nerve blocks.

## 2022-09-30 DIAGNOSIS — G43901 Migraine, unspecified, not intractable, with status migrainosus: Secondary | ICD-10-CM | POA: Insufficient documentation

## 2022-10-19 ENCOUNTER — Encounter: Payer: Medicaid Other | Admitting: Physical Medicine and Rehabilitation

## 2022-10-28 ENCOUNTER — Encounter
Payer: Medicaid Other | Attending: Physical Medicine and Rehabilitation | Admitting: Physical Medicine and Rehabilitation

## 2022-10-28 DIAGNOSIS — M7918 Myalgia, other site: Secondary | ICD-10-CM | POA: Insufficient documentation

## 2022-11-14 ENCOUNTER — Telehealth: Payer: Self-pay | Admitting: *Deleted

## 2022-11-14 NOTE — Telephone Encounter (Signed)
A discharge letter has been sent through [employer]

## 2022-11-30 ENCOUNTER — Ambulatory Visit: Payer: Medicaid Other | Admitting: Physical Medicine and Rehabilitation

## 2022-12-26 ENCOUNTER — Telehealth: Payer: Self-pay | Admitting: Neurology

## 2022-12-26 NOTE — Telephone Encounter (Signed)
Noted.

## 2022-12-26 NOTE — Telephone Encounter (Signed)
Pt calling to schedule appt for migraines. Migraines started getting worse 2-3 weeks ago. Has been taking her prescribed medications. Appt scheduled for 03/08/23. She would like a call back for a sooner appt.

## 2022-12-27 NOTE — Telephone Encounter (Signed)
Pt has another vyepti infusion this Wed 12/11.

## 2023-01-09 ENCOUNTER — Encounter: Payer: Medicaid Other | Admitting: Physical Medicine and Rehabilitation

## 2023-03-08 ENCOUNTER — Ambulatory Visit: Payer: Medicaid Other | Admitting: Neurology

## 2023-05-29 ENCOUNTER — Telehealth: Payer: Self-pay | Admitting: Neurology

## 2023-05-29 NOTE — Telephone Encounter (Signed)
 Pt called to Schedule appt.  Appt Scheduled

## 2023-06-03 ENCOUNTER — Encounter (HOSPITAL_COMMUNITY): Payer: Self-pay

## 2023-06-03 ENCOUNTER — Ambulatory Visit (HOSPITAL_COMMUNITY)
Admission: EM | Admit: 2023-06-03 | Discharge: 2023-06-03 | Disposition: A | Discharge status: Home / Self Care | Attending: Internal Medicine | Admitting: Internal Medicine

## 2023-06-03 DIAGNOSIS — H66002 Acute suppurative otitis media without spontaneous rupture of ear drum, left ear: Secondary | ICD-10-CM | POA: Diagnosis not present

## 2023-06-03 MED ORDER — DIPHENHYDRAMINE HCL 50 MG/ML IJ SOLN
INTRAMUSCULAR | Status: AC
  Filled 2023-06-03: qty 1

## 2023-06-03 MED ORDER — FLUCONAZOLE 150 MG PO TABS: 150.0000 mg | ORAL_TABLET | Freq: Every day | ORAL | 0 refills | Status: AC

## 2023-06-03 MED ORDER — AMOXICILLIN-POT CLAVULANATE 875-125 MG PO TABS: 1.0000 | ORAL_TABLET | Freq: Two times a day (BID) | ORAL | 0 refills | Status: AC

## 2023-06-03 MED ORDER — KETOROLAC TROMETHAMINE 30 MG/ML IJ SOLN
30.0000 mg | Freq: Once | INTRAMUSCULAR | Status: AC
  Administered 2023-06-03: 30 mg via INTRAMUSCULAR

## 2023-06-03 MED ORDER — DIPHENHYDRAMINE HCL 50 MG/ML IJ SOLN
25.0000 mg | Freq: Once | INTRAMUSCULAR | Status: AC
  Administered 2023-06-03: 25 mg via INTRAMUSCULAR

## 2023-06-03 MED ORDER — KETOROLAC TROMETHAMINE 30 MG/ML IJ SOLN
INTRAMUSCULAR | Status: AC
  Filled 2023-06-03: qty 1

## 2023-06-03 NOTE — Discharge Instructions (Addendum)
 Left ear canal was completely occluded with earwax and irrigation done today.  After irrigation done, the tympanic membrane (eardrum) was visualized and showed cloudy appearing fluid.  Given the symptoms present, this is most likely otitis media (middle ear infection).  We will treat with the following: Toradol  injection given today with benadryl  IM due to itching that occurs with toradol . This is a medication to help with pain. This is not a narcotic.   Augmentin  875 mg twice daily for 10 days.  This is an antibiotic.  Take this with food.  Diflucan  150 mg take 1 tablet 2 days after starting antibiotics and then repeat in 3 days.  Rest and stay hydrated.   Return to urgent care or PCP if symptoms worsen or fail to resolve.

## 2023-06-03 NOTE — ED Provider Notes (Signed)
 [facility]    CSN: [id number] Arrival date & time: 06/03/23  1327      History   Chief Complaint Chief Complaint  Patient presents with   Otalgia    HPI Tanya Krause is a 38 y.o. female.   38 year old female presents urgent care with complaints of left ear pain causing headaches and left neck pain.  She reports that this started about a week ago.  Her right ear is starting to hurt as well.  She is having associated fevers, chills and nausea.  She is not having any vomiting or diarrhea.  She has been taking promethazine .  She has had some sniffing but no true sinus congestion.  She denies shortness of breath, chest pain, loss of hearing.  She reports that she has to put pressure on her ear to make it feel better.  She has also had some dizziness associated with this.  She has not had any syncope.  She denies any visual changes.     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   Hypertension    Migraine    Pelvic inflammatory disease (PID)     Patient Active Problem List   Diagnosis Date Noted   Status migrainosus 09/30/2022   Other physeal fracture of phalanx of right toe, initial encounter for closed fracture 07/17/2020   Insomnia due to medical condition 07/17/2019   Encounter for long-term use of opiate analgesic 04/08/2019   Encounter for medication monitoring 04/08/2019   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 04/08/2019   Trochanteric bursitis of left hip 03/11/2019   S/P hysterectomy 02/06/2019   S/P TAH-BSO (total abdominal hysterectomy and bilateral salpingo-oophorectomy) 02/06/2019   Myofascial pain dysfunction syndrome 09/27/2018   Chronic bilateral  back pain 09/03/2018   Colitis 08/12/2015   Oral thrush 11/18/2014   B12 deficiency 11/17/2014   Chronic pain syndrome 11/17/2014   Numbness 11/17/2014   Intractable headache 11/17/2014   Intractable chronic migraine without aura and without status migrainosus 11/17/2014   Complicated migraine, intractable 10/11/2014   Primary fibromyalgia syndrome 10/11/2014   Lower extremity weakness 10/11/2014   Vitamin B12 deficiency 10/11/2014   Anxiety state 10/11/2014   Nausea & vomiting 10/08/2014   Headache, tension type, chronic 09/07/2012   Abdominal pain 06/15/2012    Past Surgical History:  Procedure Laterality Date   HYSTERECTOMY ABDOMINAL WITH SALPINGO-OOPHORECTOMY Bilateral 02/06/2019   Procedure: HYSTERECTOMY ABDOMINAL WITH BILATERAL SALPINGO-OOPHORECTOMY;  Surgeon: Wendelyn Halter, MD;  Location: [facility];  Service: Gynecology;  Laterality: Bilateral;   kidney infections     TUBAL LIGATION     WISDOM TOOTH EXTRACTION      OB History     Gravida  4   Para  4   Term  1   Preterm  3   AB      Living  4      SAB      IAB      Ectopic      Multiple      Live Births  4            Home Medications    Prior to  Admission medications   Medication Sig Start Date End Date Taking? Authorizing Provider  amoxicillin -clavulanate (AUGMENTIN ) 875-125 MG tablet Take 1 tablet by mouth every 12 (twelve) hours for 10 days. 06/03/23 06/13/23 Yes Damonie Furney A, PA-C  fluconazole  (DIFLUCAN ) 150 MG tablet Take 1 tablet (150 mg total) by mouth daily for 2 days. take 1 tablet after 2 days on antibiotics and then repeat in 3 days 06/03/23 06/05/23 Yes Asjah Rauda A, PA-C  albuterol  (PROVENTIL ) (2.5 MG/3ML) 0.083% nebulizer solution  06/20/20   [provider]  albuterol  (VENTOLIN  HFA) 108 (90 Base) MCG/ACT inhaler Inhale into the lungs. 01/19/12   [provider]  brexpiprazole (REXULTI) 1 MG TABS tablet Take 1 tablet by mouth daily. 11/19/20   [provider]  busPIRone  (BUSPAR ) 10 MG tablet Take 10 mg by mouth 3 (three) times daily. 09/23/20   [provider]  cyclobenzaprine  (FLEXERIL ) 10 MG tablet Take by mouth. 12/21/20   [provider]  diphenhydrAMINE  (BENADRYL ) 25 MG tablet Take by mouth. 01/25/21   [provider]  EMGALITY  120 MG/ML SOAJ Inject 1 each into the skin every 30 (thirty) days. No matter what- to decrease migraines- 02/11/22   Lovorn, Megan, MD  fluticasone  (FLONASE ) 50 MCG/ACT nasal spray  04/30/21   [provider]  folic acid  (FOLVITE ) 800 MCG tablet Take by mouth. 01/25/21   [provider]  gabapentin  (NEURONTIN ) 600 MG tablet Take 1.5 tablets (900 mg total) by mouth 3 (three) times daily. For fibromyalgia 07/27/22   Lovorn, Megan, MD  hydrOXYzine  (ATARAX /VISTARIL ) 25 MG tablet Take 1 tablet (25 mg total) by mouth every 6 (six) hours as needed for anxiety. 08/27/20   Kehrli, Kelsey F, PA-C  naproxen  (NAPROSYN ) 500 MG tablet Take 1 tablet (500 mg total) by mouth 2 (two) times daily with a meal. 05/12/21   Lovorn, Jacqlyn Matas, MD  promethazine  (PHENERGAN ) 25 MG suppository Place 1 suppository (25 mg total) rectally every 6 (six) hours as needed for nausea or vomiting. 08/19/22   Cheyenne Cotta, MD  QUEtiapine  (SEROQUEL ) 50 MG tablet Take 1-2 tablets (50-100 mg total) by mouth at bedtime as needed (sleep). 07/27/22   Lovorn, Megan, MD  triamcinolone (KENALOG) 0.025 % cream Apply 1 application  topically 2 (two) times daily. 11/16/20   [provider]  zolpidem  (AMBIEN ) 5 MG tablet  03/31/21   [provider]    Family History Family History  Problem Relation Age of Onset   Hypertension Mother    Diabetes Mother    Migraines Mother    Heart failure Father    Colon cancer Paternal Grandfather    Healthy Daughter    Healthy Son    Healthy Son    Healthy Son     Social History Social History   Tobacco Use   Smoking status: Some Days    Current packs/day: 0.14     Average packs/day: 0.1 packs/day for 5.0 years (0.7 ttl pk-yrs)    Types: Cigarettes   Smokeless tobacco: Never   Tobacco comments:    3 cig daily  Vaping Use   Vaping status: Former   Substances: CBD  Substance Use Topics   Alcohol use: Not Currently    Comment: occ   Drug use: Not Currently    Types: Marijuana    Comment: last used september 2020     Allergies   Ketorolac , Metoclopramide , Other, Tomato, Compazine  [prochlorperazine ], Hydromorphone  hcl, Ondansetron , Shellfish allergy, Toradol  [ketorolac  tromethamine ], Aspirin, Doxycycline , Hydromorphone , Hydromorphone  hcl, Ibuprofen,  Latex, Shellfish-derived products, and Tramadol    Review of Systems Review of Systems  Constitutional:  Negative for chills and fever.  HENT:  Positive for ear pain. Negative for sore throat.   Eyes:  Negative for pain and visual disturbance.  Respiratory:  Negative for cough and shortness of breath.   Cardiovascular:  Negative for chest pain and palpitations.  Gastrointestinal:  Negative for abdominal pain and vomiting.  Genitourinary:  Negative for dysuria and hematuria.  Musculoskeletal:  Negative for arthralgias and back pain.  Skin:  Negative for color change and rash.  Neurological:  Positive for dizziness. Negative for seizures and syncope.  All other systems reviewed and are negative.    Physical Exam Triage Vital Signs ED Triage Vitals  Encounter Vitals Group     BP 06/03/23 1530 (!) 146/102     Systolic BP Percentile --      Diastolic BP Percentile --      Pulse Rate 06/03/23 1530 84     Resp 06/03/23 1530 16     Temp 06/03/23 1530 97.8 F (36.6 C)     Temp Source 06/03/23 1530 Oral     SpO2 06/03/23 1530 96 %     Weight --      Height --      Head Circumference --      Peak Flow --      Pain Score 06/03/23 1529 9     Pain Loc --      Pain Education --      Exclude from Growth Chart --    No data found.  Updated Vital Signs BP (!) 146/102 (BP Location: Left Arm)    Pulse 84   Temp 97.8 F (36.6 C) (Oral)   Resp 16   LMP 12/04/2018   SpO2 96%   Visual Acuity Right Eye Distance:   Left Eye Distance:   Bilateral Distance:    Right Eye Near:   Left Eye Near:    Bilateral Near:     Physical Exam Vitals and nursing note reviewed.  Constitutional:      General: She is not in acute distress.    Appearance: She is well-developed.  HENT:     Head: Normocephalic and atraumatic.     Right Ear: Tympanic membrane normal.     Left Ear: There is impacted cerumen.  Eyes:     Conjunctiva/sclera: Conjunctivae normal.  Cardiovascular:     Rate and Rhythm: Normal rate and regular rhythm.     Heart sounds: No murmur heard. Pulmonary:     Effort: Pulmonary effort is normal. No respiratory distress.     Breath sounds: Normal breath sounds.  Abdominal:     Palpations: Abdomen is soft.     Tenderness: There is no abdominal tenderness.  Musculoskeletal:        General: No swelling.     Cervical back: Neck supple.  Skin:    General: Skin is warm and dry.     Capillary Refill: Capillary refill takes less than 2 seconds.  Neurological:     Mental Status: She is alert.  Psychiatric:        Mood and Affect: Mood normal.      UC Treatments / Results  Labs (all labs ordered are listed, but only abnormal results are displayed) Labs Reviewed - No data to display  EKG   Radiology No results found.  Procedures Procedures (including critical care time)  Medications Ordered in UC Medications  ketorolac  (TORADOL ) 30  MG/ML injection 30 mg (30 mg Intramuscular Given 06/03/23 1609)  diphenhydrAMINE  (BENADRYL ) injection 25 mg (25 mg Intramuscular Given 06/03/23 1609)    Initial Impression / Assessment and Plan / UC Course  I have reviewed the triage vital signs and the nursing notes.  Pertinent labs & imaging results that were available during my care of the patient were reviewed by me and considered in my medical decision making (see chart for  details).     Non-recurrent acute suppurative otitis media of left ear without spontaneous rupture of tympanic membrane   Left ear canal was completely occluded with cerumen and irrigation done today.  After irrigation done, the tympanic membrane  was visualized and showed an effusion that appears to be suppurative.  Given the symptoms present, this is most likely otitis media.  We will treat with the following: Toradol  injection given today with benadryl  IM due to itching that occurs with toradol .  The patient has received this multiple times in the past.   Augmentin  875 mg twice daily for 10 days.   Diflucan  150 mg take 1 tablet 2 days after starting antibiotics and then repeat in 3 days.  Rest and stay hydrated.   Return to urgent care or PCP if symptoms worsen or fail to resolve.    Final Clinical Impressions(s) / UC Diagnoses   Final diagnoses:  Non-recurrent acute suppurative otitis media of left ear without spontaneous rupture of tympanic membrane     Discharge Instructions      Left ear canal was completely occluded with earwax and irrigation done today.  After irrigation done, the tympanic membrane (eardrum) was visualized and showed cloudy appearing fluid.  Given the symptoms present, this is most likely otitis media (middle ear infection).  We will treat with the following: Toradol  injection given today with benadryl  IM due to itching that occurs with toradol . This is a medication to help with pain. This is not a narcotic.   Augmentin  875 mg twice daily for 10 days.  This is an antibiotic.  Take this with food.  Diflucan  150 mg take 1 tablet 2 days after starting antibiotics and then repeat in 3 days.  Rest and stay hydrated.   Return to urgent care or PCP if symptoms worsen or fail to resolve.     ED Prescriptions     Medication Sig Dispense Auth. Provider   amoxicillin -clavulanate (AUGMENTIN ) 875-125 MG tablet Take 1 tablet by mouth every 12 (twelve) hours for 10 days.  20 tablet Saja Bartolini A, PA-C   fluconazole  (DIFLUCAN ) 150 MG tablet Take 1 tablet (150 mg total) by mouth daily for 2 days. take 1 tablet after 2 days on antibiotics and then repeat in 3 days 2 tablet Kreg Pesa, PA-C      PDMP not reviewed this encounter.   Kreg Pesa, [place] 06/03/23 1645

## 2023-06-03 NOTE — ED Triage Notes (Signed)
 Patient reports that she has bilateral ear pain that radiates into her neck and back x 1 week. Patient also reports a headache and slight nasal congestion.  Patient states she has been using OTC ear drops and taking her already prescribed Oxycodone . The last dose was at 0800 today.

## 2023-10-18 ENCOUNTER — Ambulatory Visit: Admitting: Neurology

## 2023-10-26 ENCOUNTER — Telehealth: Payer: Self-pay | Admitting: Neurology

## 2023-10-26 NOTE — Telephone Encounter (Signed)
 Called and left voicemail for patient to reschedule appointment on 02/20/24 with Dr Ines.  If patient calls back, they can be rescheduled with Dr Gregg

## 2024-02-20 ENCOUNTER — Ambulatory Visit: Admitting: Neurology
